# Patient Record
Sex: Male | Born: 1945
Health system: Southern US, Community
[De-identification: ages and names within clinical notes are randomized; demographics above are authoritative.]

## PROBLEM LIST (undated history)

## (undated) DIAGNOSIS — R7303 Prediabetes: Secondary | ICD-10-CM

## (undated) DIAGNOSIS — M1990 Unspecified osteoarthritis, unspecified site: Secondary | ICD-10-CM

## (undated) DIAGNOSIS — I2510 Atherosclerotic heart disease of native coronary artery without angina pectoris: Secondary | ICD-10-CM

## (undated) DIAGNOSIS — Z720 Tobacco use: Secondary | ICD-10-CM

## (undated) DIAGNOSIS — M545 Low back pain, unspecified: Secondary | ICD-10-CM

## (undated) DIAGNOSIS — D696 Thrombocytopenia, unspecified: Secondary | ICD-10-CM

## (undated) DIAGNOSIS — R001 Bradycardia, unspecified: Secondary | ICD-10-CM

## (undated) DIAGNOSIS — I714 Abdominal aortic aneurysm, without rupture, unspecified: Secondary | ICD-10-CM

## (undated) DIAGNOSIS — I779 Disorder of arteries and arterioles, unspecified: Secondary | ICD-10-CM

## (undated) DIAGNOSIS — I639 Cerebral infarction, unspecified: Secondary | ICD-10-CM

## (undated) DIAGNOSIS — Z9289 Personal history of other medical treatment: Secondary | ICD-10-CM

## (undated) DIAGNOSIS — N183 Chronic kidney disease, stage 3 unspecified: Secondary | ICD-10-CM

## (undated) DIAGNOSIS — I739 Peripheral vascular disease, unspecified: Secondary | ICD-10-CM

## (undated) DIAGNOSIS — D649 Anemia, unspecified: Secondary | ICD-10-CM

## (undated) DIAGNOSIS — H5440 Blindness, one eye, unspecified eye: Secondary | ICD-10-CM

## (undated) DIAGNOSIS — I480 Paroxysmal atrial fibrillation: Secondary | ICD-10-CM

## (undated) DIAGNOSIS — E785 Hyperlipidemia, unspecified: Secondary | ICD-10-CM

## (undated) DIAGNOSIS — G8929 Other chronic pain: Secondary | ICD-10-CM

## (undated) DIAGNOSIS — I10 Essential (primary) hypertension: Secondary | ICD-10-CM

## (undated) DIAGNOSIS — K219 Gastro-esophageal reflux disease without esophagitis: Secondary | ICD-10-CM

## (undated) DIAGNOSIS — L219 Seborrheic dermatitis, unspecified: Secondary | ICD-10-CM

## (undated) HISTORY — DX: Abdominal aortic aneurysm, without rupture: I71.4

## (undated) HISTORY — DX: Chronic kidney disease, stage 3 (moderate): N18.3

## (undated) HISTORY — DX: Atherosclerotic heart disease of native coronary artery without angina pectoris: I25.10

## (undated) HISTORY — DX: Seborrheic dermatitis, unspecified: L21.9

## (undated) HISTORY — DX: Essential (primary) hypertension: I10

## (undated) HISTORY — DX: Peripheral vascular disease, unspecified: I73.9

## (undated) HISTORY — DX: Bradycardia, unspecified: R00.1

## (undated) HISTORY — DX: Anemia, unspecified: D64.9

## (undated) HISTORY — DX: Gastro-esophageal reflux disease without esophagitis: K21.9

## (undated) HISTORY — DX: Hyperlipidemia, unspecified: E78.5

## (undated) HISTORY — PX: CATARACT EXTRACTION W/ INTRAOCULAR LENS  IMPLANT, BILATERAL: SHX1307

## (undated) HISTORY — DX: Chronic kidney disease, stage 3 unspecified: N18.30

## (undated) HISTORY — DX: Disorder of arteries and arterioles, unspecified: I77.9

## (undated) HISTORY — DX: Paroxysmal atrial fibrillation: I48.0

## (undated) HISTORY — DX: Abdominal aortic aneurysm, without rupture, unspecified: I71.40

## (undated) HISTORY — DX: Thrombocytopenia, unspecified: D69.6

## (undated) HISTORY — DX: Tobacco use: Z72.0

---

## 1988-04-03 HISTORY — PX: SHOULDER OPEN ROTATOR CUFF REPAIR: SHX2407

## 2002-04-03 DIAGNOSIS — Z9289 Personal history of other medical treatment: Secondary | ICD-10-CM

## 2002-04-03 HISTORY — DX: Personal history of other medical treatment: Z92.89

## 2002-04-03 HISTORY — PX: CORONARY ARTERY BYPASS GRAFT: SHX141

## 2002-12-02 ENCOUNTER — Encounter: Admission: RE | Admit: 2002-12-02 | Discharge: 2002-12-02 | Payer: Self-pay | Admitting: Internal Medicine

## 2003-01-08 ENCOUNTER — Encounter: Admission: RE | Admit: 2003-01-08 | Discharge: 2003-01-08 | Payer: Self-pay | Admitting: Internal Medicine

## 2003-01-26 ENCOUNTER — Encounter: Admission: RE | Admit: 2003-01-26 | Discharge: 2003-01-26 | Payer: Self-pay | Admitting: Internal Medicine

## 2003-08-28 ENCOUNTER — Inpatient Hospital Stay (HOSPITAL_COMMUNITY): Admission: EM | Admit: 2003-08-28 | Discharge: 2003-08-31 | Payer: Self-pay | Admitting: Emergency Medicine

## 2003-09-09 ENCOUNTER — Encounter: Admission: RE | Admit: 2003-09-09 | Discharge: 2003-09-09 | Payer: Self-pay | Admitting: Internal Medicine

## 2003-12-24 ENCOUNTER — Emergency Department (HOSPITAL_COMMUNITY): Admission: EM | Admit: 2003-12-24 | Discharge: 2003-12-24 | Payer: Self-pay | Admitting: Family Medicine

## 2003-12-26 ENCOUNTER — Emergency Department (HOSPITAL_COMMUNITY): Admission: EM | Admit: 2003-12-26 | Discharge: 2003-12-26 | Payer: Self-pay | Admitting: Emergency Medicine

## 2004-02-17 ENCOUNTER — Emergency Department (HOSPITAL_COMMUNITY): Admission: EM | Admit: 2004-02-17 | Discharge: 2004-02-17 | Payer: Self-pay | Admitting: Family Medicine

## 2004-06-02 ENCOUNTER — Emergency Department (HOSPITAL_COMMUNITY): Admission: EM | Admit: 2004-06-02 | Discharge: 2004-06-02 | Payer: Self-pay | Admitting: Family Medicine

## 2004-06-04 ENCOUNTER — Emergency Department (HOSPITAL_COMMUNITY): Admission: EM | Admit: 2004-06-04 | Discharge: 2004-06-04 | Payer: Self-pay | Admitting: Family Medicine

## 2004-12-30 ENCOUNTER — Emergency Department (HOSPITAL_COMMUNITY): Admission: EM | Admit: 2004-12-30 | Discharge: 2004-12-31 | Payer: Self-pay | Admitting: Emergency Medicine

## 2005-02-18 ENCOUNTER — Inpatient Hospital Stay (HOSPITAL_COMMUNITY): Admission: EM | Admit: 2005-02-18 | Discharge: 2005-02-21 | Payer: Self-pay | Admitting: Emergency Medicine

## 2005-02-18 ENCOUNTER — Ambulatory Visit: Payer: Self-pay | Admitting: Internal Medicine

## 2005-02-20 ENCOUNTER — Encounter (INDEPENDENT_AMBULATORY_CARE_PROVIDER_SITE_OTHER): Payer: Self-pay | Admitting: Interventional Cardiology

## 2005-05-01 ENCOUNTER — Ambulatory Visit: Payer: Self-pay | Admitting: Internal Medicine

## 2005-06-29 ENCOUNTER — Ambulatory Visit: Payer: Self-pay | Admitting: Internal Medicine

## 2006-02-13 ENCOUNTER — Emergency Department (HOSPITAL_COMMUNITY): Admission: EM | Admit: 2006-02-13 | Discharge: 2006-02-13 | Payer: Self-pay | Admitting: Emergency Medicine

## 2006-02-20 DIAGNOSIS — K219 Gastro-esophageal reflux disease without esophagitis: Secondary | ICD-10-CM | POA: Insufficient documentation

## 2006-02-20 DIAGNOSIS — N183 Chronic kidney disease, stage 3 unspecified: Secondary | ICD-10-CM | POA: Insufficient documentation

## 2006-02-20 DIAGNOSIS — M549 Dorsalgia, unspecified: Secondary | ICD-10-CM | POA: Insufficient documentation

## 2006-03-01 ENCOUNTER — Ambulatory Visit: Payer: Self-pay | Admitting: Internal Medicine

## 2006-03-01 ENCOUNTER — Encounter (INDEPENDENT_AMBULATORY_CARE_PROVIDER_SITE_OTHER): Payer: Self-pay | Admitting: Internal Medicine

## 2006-03-01 LAB — CONVERTED CEMR LAB
ALT: 63 units/L — ABNORMAL HIGH (ref 0–53)
AST: 76 units/L — ABNORMAL HIGH (ref 0–37)
Albumin: 4.2 g/dL (ref 3.5–5.2)
Alkaline Phosphatase: 78 units/L (ref 39–117)
BUN: 16 mg/dL (ref 6–23)
Basophils Absolute: 0 10*3/uL (ref 0.0–0.1)
Basophils Relative: 0 % (ref 0–1)
CO2: 27 meq/L (ref 19–32)
Calcium: 9.8 mg/dL (ref 8.4–10.5)
Chloride: 106 meq/L (ref 96–112)
Creatinine, Ser: 1.4 mg/dL (ref 0.40–1.50)
Eosinophils Relative: 2 % (ref 0–4)
Glucose, Bld: 99 mg/dL (ref 70–99)
HCT: 47.2 % (ref 41.0–49.0)
Hemoglobin: 16.2 g/dL (ref 13.9–16.8)
Lymphocytes Relative: 26 % (ref 15–43)
Lymphs Abs: 1.7 10*3/uL (ref 0.8–3.1)
MCHC: 34.4 g/dL (ref 33.1–35.4)
MCV: 94 fL (ref 78.8–100.0)
Monocytes Absolute: 0.7 10*3/uL (ref 0.2–0.7)
Monocytes Relative: 11 % (ref 3–11)
Neutro Abs: 3.9 10*3/uL (ref 1.8–6.8)
Neutrophils Relative %: 61 % (ref 47–77)
Platelets: 161 10*3/uL (ref 152–374)
Potassium: 4.7 meq/L (ref 3.5–5.3)
RBC: 5.02 M/uL (ref 4.20–5.50)
RDW: 14 % (ref 11.5–15.3)
Sodium: 141 meq/L (ref 135–145)
Total Bilirubin: 0.9 mg/dL (ref 0.3–1.2)
Total Protein: 7.6 g/dL (ref 6.0–8.3)
WBC: 6.4 10*3/uL (ref 3.7–10.0)

## 2006-03-02 ENCOUNTER — Emergency Department (HOSPITAL_COMMUNITY): Admission: EM | Admit: 2006-03-02 | Discharge: 2006-03-02 | Payer: Self-pay | Admitting: Emergency Medicine

## 2006-03-09 ENCOUNTER — Encounter (INDEPENDENT_AMBULATORY_CARE_PROVIDER_SITE_OTHER): Payer: Self-pay | Admitting: *Deleted

## 2006-03-09 ENCOUNTER — Ambulatory Visit: Payer: Self-pay | Admitting: Internal Medicine

## 2006-03-09 LAB — CONVERTED CEMR LAB
BUN: 15 mg/dL (ref 6–23)
CO2: 28 meq/L (ref 19–32)
Calcium: 9.3 mg/dL (ref 8.4–10.5)
Chloride: 104 meq/L (ref 96–112)
Creatinine, Ser: 1.2 mg/dL (ref 0.40–1.50)
Glucose, Bld: 114 mg/dL — ABNORMAL HIGH (ref 70–99)
Potassium: 4.3 meq/L (ref 3.5–5.3)
Sodium: 138 meq/L (ref 135–145)

## 2006-03-13 ENCOUNTER — Ambulatory Visit: Payer: Self-pay | Admitting: Internal Medicine

## 2006-03-13 ENCOUNTER — Encounter: Payer: Self-pay | Admitting: Internal Medicine

## 2006-03-13 LAB — CONVERTED CEMR LAB
ALT: 54 units/L — ABNORMAL HIGH (ref 0–53)
AST: 58 units/L — ABNORMAL HIGH (ref 0–37)
Albumin: 4.3 g/dL (ref 3.5–5.2)
Alkaline Phosphatase: 81 units/L (ref 39–117)
Bilirubin, Direct: 0.1 mg/dL (ref 0.0–0.3)
Cholesterol: 157 mg/dL (ref 0–200)
HCV Ab: NEGATIVE
HDL: 36 mg/dL — ABNORMAL LOW (ref >39)
Hep A Total Ab: NEGATIVE
Hep B Core Total Ab: NEGATIVE
Hep B S Ab: NEGATIVE
Hepatitis B Surface Ag: NEGATIVE
Indirect Bilirubin: 0.7 mg/dL (ref 0.0–0.9)
LDL Cholesterol: 85 mg/dL (ref 0–99)
Total Bilirubin: 0.8 mg/dL (ref 0.3–1.2)
Total CHOL/HDL Ratio: 4.4
Total Protein: 8 g/dL (ref 6.0–8.3)
Triglycerides: 178 mg/dL — ABNORMAL HIGH (ref <150)
VLDL: 36 mg/dL (ref 0–40)

## 2006-03-23 ENCOUNTER — Ambulatory Visit: Payer: Self-pay | Admitting: Internal Medicine

## 2006-04-09 ENCOUNTER — Ambulatory Visit (HOSPITAL_COMMUNITY): Admission: RE | Admit: 2006-04-09 | Discharge: 2006-04-09 | Payer: Self-pay | Admitting: Hospitalist

## 2006-04-09 ENCOUNTER — Ambulatory Visit: Payer: Self-pay | Admitting: Hospitalist

## 2006-05-14 ENCOUNTER — Encounter (INDEPENDENT_AMBULATORY_CARE_PROVIDER_SITE_OTHER): Payer: Self-pay | Admitting: *Deleted

## 2006-05-14 ENCOUNTER — Ambulatory Visit: Payer: Self-pay | Admitting: Hospitalist

## 2006-05-14 LAB — CONVERTED CEMR LAB
Inflenza A Ag: NEGATIVE
Influenza B Ag: NEGATIVE

## 2006-05-22 ENCOUNTER — Ambulatory Visit (HOSPITAL_COMMUNITY): Admission: RE | Admit: 2006-05-22 | Discharge: 2006-05-22 | Payer: Self-pay | Admitting: Hospitalist

## 2006-05-22 ENCOUNTER — Encounter (INDEPENDENT_AMBULATORY_CARE_PROVIDER_SITE_OTHER): Payer: Self-pay | Admitting: Hospitalist

## 2006-06-11 ENCOUNTER — Ambulatory Visit: Payer: Self-pay | Admitting: Internal Medicine

## 2006-06-11 ENCOUNTER — Encounter (INDEPENDENT_AMBULATORY_CARE_PROVIDER_SITE_OTHER): Payer: Self-pay | Admitting: Unknown Physician Specialty

## 2006-06-11 ENCOUNTER — Ambulatory Visit: Payer: Self-pay | Admitting: Cardiology

## 2006-07-02 ENCOUNTER — Telehealth: Payer: Self-pay | Admitting: *Deleted

## 2006-07-03 ENCOUNTER — Ambulatory Visit: Payer: Self-pay | Admitting: Internal Medicine

## 2006-07-10 ENCOUNTER — Ambulatory Visit: Payer: Self-pay | Admitting: Internal Medicine

## 2006-07-10 ENCOUNTER — Encounter (INDEPENDENT_AMBULATORY_CARE_PROVIDER_SITE_OTHER): Payer: Self-pay | Admitting: Internal Medicine

## 2006-07-10 LAB — CONVERTED CEMR LAB
BUN: 17 mg/dL (ref 6–23)
CO2: 25 meq/L (ref 19–32)
Calcium: 9.7 mg/dL (ref 8.4–10.5)
Chloride: 103 meq/L (ref 96–112)
Creatinine, Ser: 1.31 mg/dL (ref 0.40–1.50)
Glucose, Bld: 91 mg/dL (ref 70–99)
Potassium: 4.6 meq/L (ref 3.5–5.3)
Sodium: 141 meq/L (ref 135–145)

## 2006-08-10 ENCOUNTER — Ambulatory Visit: Payer: Self-pay | Admitting: Cardiology

## 2006-08-17 ENCOUNTER — Ambulatory Visit: Payer: Self-pay | Admitting: Cardiology

## 2006-08-17 LAB — CONVERTED CEMR LAB
Albumin: 3.8 g/dL (ref 3.5–5.2)
Direct LDL: 73.7 mg/dL
HDL: 36.3 mg/dL — ABNORMAL LOW (ref >39.0)
Triglycerides: 309 mg/dL (ref 0–149)
VLDL: 62 mg/dL — ABNORMAL HIGH (ref 0–40)

## 2006-08-21 ENCOUNTER — Encounter (INDEPENDENT_AMBULATORY_CARE_PROVIDER_SITE_OTHER): Payer: Self-pay | Admitting: Internal Medicine

## 2006-08-21 ENCOUNTER — Ambulatory Visit: Payer: Self-pay | Admitting: Internal Medicine

## 2006-08-21 LAB — CONVERTED CEMR LAB: Vitamin B-12: 375 pg/mL (ref 211–911)

## 2006-08-29 ENCOUNTER — Ambulatory Visit: Payer: Self-pay | Admitting: Internal Medicine

## 2006-08-29 ENCOUNTER — Encounter (INDEPENDENT_AMBULATORY_CARE_PROVIDER_SITE_OTHER): Payer: Self-pay | Admitting: Internal Medicine

## 2006-08-29 LAB — CONVERTED CEMR LAB
BUN: 20 mg/dL (ref 6–23)
Potassium: 4.2 meq/L (ref 3.5–5.3)
Sodium: 140 meq/L (ref 135–145)

## 2007-02-04 ENCOUNTER — Ambulatory Visit: Payer: Self-pay | Admitting: Hospitalist

## 2007-02-04 ENCOUNTER — Encounter (INDEPENDENT_AMBULATORY_CARE_PROVIDER_SITE_OTHER): Payer: Self-pay | Admitting: *Deleted

## 2007-02-04 DIAGNOSIS — G4730 Sleep apnea, unspecified: Secondary | ICD-10-CM | POA: Insufficient documentation

## 2007-02-04 DIAGNOSIS — F329 Major depressive disorder, single episode, unspecified: Secondary | ICD-10-CM

## 2007-02-04 DIAGNOSIS — F32A Depression, unspecified: Secondary | ICD-10-CM | POA: Insufficient documentation

## 2007-02-05 LAB — CONVERTED CEMR LAB
HCT: 46.1 % (ref 39.0–52.0)
MCV: 93.1 fL (ref 78.0–100.0)
RBC: 4.95 M/uL (ref 4.22–5.81)
TSH: 1.262 microintl units/mL (ref 0.350–5.50)
WBC: 6 10*3/uL (ref 4.0–10.5)

## 2007-02-08 ENCOUNTER — Telehealth: Payer: Self-pay | Admitting: *Deleted

## 2007-02-12 ENCOUNTER — Ambulatory Visit (HOSPITAL_BASED_OUTPATIENT_CLINIC_OR_DEPARTMENT_OTHER): Admission: RE | Admit: 2007-02-12 | Discharge: 2007-02-12 | Payer: Self-pay | Admitting: *Deleted

## 2007-02-12 ENCOUNTER — Encounter (INDEPENDENT_AMBULATORY_CARE_PROVIDER_SITE_OTHER): Payer: Self-pay | Admitting: Internal Medicine

## 2007-02-17 ENCOUNTER — Ambulatory Visit: Payer: Self-pay | Admitting: Internal Medicine

## 2007-02-21 ENCOUNTER — Ambulatory Visit: Payer: Self-pay | Admitting: Internal Medicine

## 2007-02-21 ENCOUNTER — Encounter (INDEPENDENT_AMBULATORY_CARE_PROVIDER_SITE_OTHER): Payer: Self-pay | Admitting: Internal Medicine

## 2007-02-21 LAB — CONVERTED CEMR LAB
AST: 37 units/L (ref 0–37)
BUN: 24 mg/dL — ABNORMAL HIGH (ref 6–23)
Calcium: 9.7 mg/dL (ref 8.4–10.5)
Chloride: 103 meq/L (ref 96–112)
Cholesterol: 159 mg/dL (ref 0–200)
Creatinine, Ser: 1.37 mg/dL (ref 0.40–1.50)
Glucose, Bld: 91 mg/dL (ref 70–99)
HCT: 48.1 % (ref 39.0–52.0)
HDL: 35 mg/dL — ABNORMAL LOW (ref >39)
Hemoglobin: 16 g/dL (ref 13.0–17.0)
RDW: 13.8 % (ref 11.5–15.5)
Total CHOL/HDL Ratio: 4.5
Total CK: 316 units/L — ABNORMAL HIGH (ref 7–232)
Triglycerides: 265 mg/dL — ABNORMAL HIGH (ref <150)

## 2007-03-11 ENCOUNTER — Telehealth: Payer: Self-pay | Admitting: *Deleted

## 2007-03-11 ENCOUNTER — Ambulatory Visit: Payer: Self-pay | Admitting: Internal Medicine

## 2007-05-09 ENCOUNTER — Encounter (INDEPENDENT_AMBULATORY_CARE_PROVIDER_SITE_OTHER): Payer: Self-pay | Admitting: Internal Medicine

## 2007-05-09 ENCOUNTER — Ambulatory Visit: Payer: Self-pay | Admitting: Hospitalist

## 2007-07-04 ENCOUNTER — Emergency Department (HOSPITAL_COMMUNITY): Admission: EM | Admit: 2007-07-04 | Discharge: 2007-07-05 | Payer: Self-pay | Admitting: Emergency Medicine

## 2007-07-23 ENCOUNTER — Emergency Department (HOSPITAL_COMMUNITY): Admission: EM | Admit: 2007-07-23 | Discharge: 2007-07-24 | Payer: Self-pay | Admitting: Emergency Medicine

## 2007-09-22 ENCOUNTER — Emergency Department (HOSPITAL_COMMUNITY): Admission: EM | Admit: 2007-09-22 | Discharge: 2007-09-22 | Payer: Self-pay | Admitting: Emergency Medicine

## 2008-03-06 ENCOUNTER — Emergency Department (HOSPITAL_COMMUNITY): Admission: EM | Admit: 2008-03-06 | Discharge: 2008-03-06 | Payer: Self-pay | Admitting: Emergency Medicine

## 2008-04-03 HISTORY — PX: ABDOMINAL AORTIC ANEURYSM REPAIR: SUR1152

## 2008-06-05 ENCOUNTER — Encounter (INDEPENDENT_AMBULATORY_CARE_PROVIDER_SITE_OTHER): Payer: Self-pay | Admitting: *Deleted

## 2008-06-05 ENCOUNTER — Ambulatory Visit: Payer: Self-pay | Admitting: Internal Medicine

## 2008-06-05 LAB — CONVERTED CEMR LAB
Albumin: 4.2 g/dL (ref 3.5–5.2)
BUN: 21 mg/dL (ref 6–23)
CO2: 24 meq/L (ref 19–32)
Calcium: 9.7 mg/dL (ref 8.4–10.5)
Chloride: 104 meq/L (ref 96–112)
Cholesterol: 161 mg/dL (ref 0–200)
Creatinine, Ser: 1.17 mg/dL (ref 0.40–1.50)
Glucose, Bld: 104 mg/dL — ABNORMAL HIGH (ref 70–99)
HDL: 36 mg/dL — ABNORMAL LOW (ref >39)
Total CHOL/HDL Ratio: 4.5
Triglycerides: 186 mg/dL — ABNORMAL HIGH (ref <150)

## 2008-07-10 DIAGNOSIS — L219 Seborrheic dermatitis, unspecified: Secondary | ICD-10-CM | POA: Insufficient documentation

## 2008-11-03 ENCOUNTER — Telehealth: Payer: Self-pay | Admitting: *Deleted

## 2008-11-04 ENCOUNTER — Emergency Department (HOSPITAL_COMMUNITY): Admission: EM | Admit: 2008-11-04 | Discharge: 2008-11-04 | Payer: Self-pay | Admitting: Emergency Medicine

## 2008-11-17 ENCOUNTER — Ambulatory Visit: Payer: Self-pay | Admitting: Licensed Clinical Social Worker

## 2008-12-03 ENCOUNTER — Ambulatory Visit: Payer: Self-pay | Admitting: Internal Medicine

## 2008-12-11 ENCOUNTER — Encounter: Payer: Self-pay | Admitting: Internal Medicine

## 2008-12-11 ENCOUNTER — Ambulatory Visit (HOSPITAL_COMMUNITY): Admission: RE | Admit: 2008-12-11 | Discharge: 2008-12-11 | Payer: Self-pay | Admitting: Internal Medicine

## 2008-12-11 ENCOUNTER — Ambulatory Visit: Payer: Self-pay | Admitting: Surgery

## 2008-12-16 ENCOUNTER — Encounter: Payer: Self-pay | Admitting: Internal Medicine

## 2008-12-21 ENCOUNTER — Ambulatory Visit: Payer: Self-pay | Admitting: Vascular Surgery

## 2009-01-05 ENCOUNTER — Telehealth: Payer: Self-pay | Admitting: Internal Medicine

## 2009-01-19 ENCOUNTER — Encounter: Admission: RE | Admit: 2009-01-19 | Discharge: 2009-01-19 | Payer: Self-pay | Admitting: Vascular Surgery

## 2009-01-19 ENCOUNTER — Ambulatory Visit: Payer: Self-pay | Admitting: Vascular Surgery

## 2009-01-22 ENCOUNTER — Encounter: Payer: Self-pay | Admitting: Internal Medicine

## 2009-01-22 ENCOUNTER — Ambulatory Visit: Payer: Self-pay | Admitting: Internal Medicine

## 2009-01-22 LAB — CONVERTED CEMR LAB
ALT: 25 units/L (ref 0–53)
BUN: 20 mg/dL (ref 6–23)
Blood in Urine, dipstick: NEGATIVE
CO2: 28 meq/L (ref 19–32)
Calcium: 9 mg/dL (ref 8.4–10.5)
Creatinine, Ser: 1.33 mg/dL (ref 0.40–1.50)
HCT: 41.8 % (ref 39.0–52.0)
Hemoglobin: 14.8 g/dL (ref 13.0–17.0)
MCV: 92.3 fL (ref >=78.0)
Platelets: 167 10*3/uL (ref 150–400)
Protein, U semiquant: NEGATIVE
RDW: 12.8 % (ref 11.5–15.5)
Total Bilirubin: 0.7 mg/dL (ref 0.3–1.2)
Urobilinogen, UA: 0.2
WBC Urine, dipstick: NEGATIVE

## 2009-01-25 ENCOUNTER — Ambulatory Visit: Payer: Self-pay | Admitting: Internal Medicine

## 2009-01-25 ENCOUNTER — Ambulatory Visit (HOSPITAL_COMMUNITY): Admission: RE | Admit: 2009-01-25 | Discharge: 2009-01-25 | Payer: Self-pay | Admitting: Internal Medicine

## 2009-01-25 ENCOUNTER — Encounter: Payer: Self-pay | Admitting: Internal Medicine

## 2009-01-25 LAB — CONVERTED CEMR LAB

## 2009-01-30 ENCOUNTER — Encounter: Payer: Self-pay | Admitting: Cardiology

## 2009-02-03 ENCOUNTER — Encounter (INDEPENDENT_AMBULATORY_CARE_PROVIDER_SITE_OTHER): Payer: Self-pay | Admitting: *Deleted

## 2009-02-03 ENCOUNTER — Ambulatory Visit: Payer: Self-pay | Admitting: Vascular Surgery

## 2009-02-03 ENCOUNTER — Inpatient Hospital Stay (HOSPITAL_COMMUNITY): Admission: RE | Admit: 2009-02-03 | Discharge: 2009-02-04 | Payer: Self-pay | Admitting: Vascular Surgery

## 2009-02-03 ENCOUNTER — Encounter: Payer: Self-pay | Admitting: Cardiology

## 2009-02-18 ENCOUNTER — Emergency Department (HOSPITAL_COMMUNITY): Admission: EM | Admit: 2009-02-18 | Discharge: 2009-02-18 | Payer: Self-pay | Admitting: Emergency Medicine

## 2009-02-22 ENCOUNTER — Ambulatory Visit: Payer: Self-pay | Admitting: Cardiology

## 2009-02-22 DIAGNOSIS — I480 Paroxysmal atrial fibrillation: Secondary | ICD-10-CM

## 2009-02-22 HISTORY — DX: Paroxysmal atrial fibrillation: I48.0

## 2009-02-25 ENCOUNTER — Inpatient Hospital Stay (HOSPITAL_COMMUNITY): Admission: EM | Admit: 2009-02-25 | Discharge: 2009-03-03 | Payer: Self-pay | Admitting: Emergency Medicine

## 2009-03-01 ENCOUNTER — Ambulatory Visit: Payer: Self-pay | Admitting: Infectious Diseases

## 2009-03-03 ENCOUNTER — Ambulatory Visit: Payer: Self-pay | Admitting: Infectious Diseases

## 2009-03-08 ENCOUNTER — Telehealth: Payer: Self-pay | Admitting: Cardiology

## 2009-03-08 ENCOUNTER — Encounter: Payer: Self-pay | Admitting: Internal Medicine

## 2009-03-10 ENCOUNTER — Ambulatory Visit: Payer: Self-pay | Admitting: Cardiology

## 2009-03-11 ENCOUNTER — Telehealth: Payer: Self-pay | Admitting: Infectious Diseases

## 2009-03-15 ENCOUNTER — Encounter: Payer: Self-pay | Admitting: Infectious Diseases

## 2009-03-16 ENCOUNTER — Ambulatory Visit: Payer: Self-pay | Admitting: Vascular Surgery

## 2009-03-17 ENCOUNTER — Telehealth: Payer: Self-pay | Admitting: Infectious Diseases

## 2009-03-29 ENCOUNTER — Encounter: Payer: Self-pay | Admitting: Internal Medicine

## 2009-04-05 ENCOUNTER — Encounter: Payer: Self-pay | Admitting: Infectious Diseases

## 2009-04-06 ENCOUNTER — Ambulatory Visit: Payer: Self-pay | Admitting: Vascular Surgery

## 2009-04-07 ENCOUNTER — Telehealth: Payer: Self-pay | Admitting: Infectious Diseases

## 2009-04-07 ENCOUNTER — Encounter: Payer: Self-pay | Admitting: Infectious Diseases

## 2009-06-08 ENCOUNTER — Encounter (INDEPENDENT_AMBULATORY_CARE_PROVIDER_SITE_OTHER): Payer: Self-pay | Admitting: *Deleted

## 2009-12-16 ENCOUNTER — Telehealth: Payer: Self-pay | Admitting: Internal Medicine

## 2010-02-07 ENCOUNTER — Ambulatory Visit: Payer: Self-pay | Admitting: Internal Medicine

## 2010-02-18 ENCOUNTER — Telehealth: Payer: Self-pay | Admitting: Internal Medicine

## 2010-04-24 ENCOUNTER — Encounter: Payer: Self-pay | Admitting: Vascular Surgery

## 2010-05-03 NOTE — Progress Notes (Signed)
Summary: Refill/gh  Phone Note Refill Request Message from:  Patient on February 18, 2010 3:26 PM  Pt called and said that he was told that when his Cellulitis flares up that he counld get a prescription for an antibiotic.  Has a little reddness in his leg.  Hurts inside where he had his open heart surgery.  Pt was seen by doctor 02/07/2010.   Method Requested: Fax to Local Pharmacy Initial call taken by: Angelina Ok RN,  February 18, 2010 3:29 PM  Follow-up for Phone Call        I discussed this pt with Dr Odis Luster at his last visit. He had no PE exam findings at that time to support use of ABX and we did tell pt that he could call in and we would either see him or call in ABX. It still doesn't seem as if this is cellulitis but since Fri and he is expecting ABX  and we can't see him until next I will call in ABX. Keflex won't provide MRSA coverage so will call in Bactrium.   Follow-up by: Blanch Media MD,  February 18, 2010 5:36 PM    New/Updated Medications: SULFAMETHOXAZOLE-TRIMETHOPRIM 400-80 MG TABS (SULFAMETHOXAZOLE-TRIMETHOPRIM) One by mouth two times a day for 7 days for leg. Prescriptions: SULFAMETHOXAZOLE-TRIMETHOPRIM 400-80 MG TABS (SULFAMETHOXAZOLE-TRIMETHOPRIM) One by mouth two times a day for 7 days for leg.  #14 x 0   Entered and Authorized by:   Blanch Media MD   Signed by:   Blanch Media MD on 02/18/2010   Method used:   Electronically to        [facility] [phone]* (retail)       [address]       [place], [place]  [address]       Ph: [phone]       Fax: [phone]   RxID:   [id number]   Appended Document: Refill/gh Call to pt to see if he has picked up his antibiotic.  Pt said that he did and that his leg is looking better.  Angelina Ok, RN February 21, 2010.  11:40 AM

## 2010-05-03 NOTE — Progress Notes (Signed)
Summary: Missed apptment and needs to stop abx and pull picc  Phone Note Outgoing Call   Summary of Call: Pt missed his follow up yesterday. Reviewed his labs and they are stable. He has completed his IV abx.   WIll give order to d/c abx and pull picc.   [facility] and call pt to let him know.   I can work him in this week or next week if needed Thanks Theodoro Grist Initial call taken by: Clydie Braun MD,  April 07, 2009 12:29 PM  Follow-up for Phone Call        [facility] called today to see if pt was here for visit. The documented info. by Dr Sampson Goon given. Tomasita Morrow RN  April 07, 2009 4:30 PM

## 2010-05-03 NOTE — Letter (Signed)
Summary: Appointment - Reschedule  [facility], Main Office  [address]   [place], [place] [address]   Phone: [phone]  Fax: [phone]     June 08, 2009 MRN: [id number]   Dylan Morgan [address] [place], [place]  [address]   Dear Dylan Morgan,   Due to a change in our office schedule, your appointment on 06/16/2009 at  8:30am with Dr Myrtis Ser must be changed.  It is very important that we reach you to reschedule this appointment. We look forward to participating in your health care needs. Please contact us at the number listed above at your earliest convenience to reschedule this appointment.     Sincerely,  Migdalia Dk [facility] Scheduling Team

## 2010-05-03 NOTE — Assessment & Plan Note (Signed)
Summary: ACUTE-F/U WITH CELLULITIS/CFB(VEGA)   Vital Signs:  Patient profile:   65 year old male Height:      69 inches Weight:      270.7 pounds BMI:     40.12 Temp:     97.4 degrees F oral Pulse rate:   61 / minute BP sitting:   150 / 101  (right arm)  Vitals Entered By: Filomena Jungling NT II (February 07, 2010 2:41 PM) CC: KNEE PAIN AND BACK, Depression Is Patient Diabetic? No Pain Assessment Patient in pain? yes     Location: KNEES, AND BACK Intensity: 10 Type: aching Onset of pain  Chronic Nutritional Status BMI of > 30 = obese  Have you ever been in a relationship where you felt threatened, hurt or afraid?No   Does patient need assistance? Functional Status Self care Ambulation Normal   Primary Care Provider:  Laren Everts MD  CC:  KNEE PAIN AND BACK and Depression.  History of Present Illness: This is a 65 year old with a hx of CAD sp CABG,  aortofemoral bypass, and HTN who presents with concerns that he is developing cellulitis.  Pt states that this is recurrent and occurs annually when the weather turns cold.  Pt experiences leg pain that is most pronounced at night.  Pt describes the pain as intermittant throbbing and of 8-9/10 in intensity.   Mr. Quintela has noticed that warmth tends to improve the pain and he get some relief with a heating blanket or by standing in front of a fire.  The patient states that this has been going on since he had the saphenous graft taken from his leg for is CABG.  Pt has tried compression stockings in the past but this has not helped.  Mr. Hissong states that in 2008, he was given a prescription of keflex which prevented him from developing cellulitis that year.  The pt does deny any redness or increased swelling of the right leg.   He also denies any nausea, vomiting, fever, chills, sob, or chest pain.    Depression History:      The patient denies a depressed mood most of the day and a diminished interest in his usual daily  activities.         Preventive Screening-Counseling & Management  Alcohol-Tobacco     Alcohol drinks/day: 0     Smoking Status: current     Smoking Cessation Counseling: yes     Packs/Day: 1 pack     Year Started: many years  Caffeine-Diet-Exercise     Does Patient Exercise: yes     Type of exercise: WALKING     Times/week: 1-2  Problems Prior to Update: 1)  Methicillin Susceptible Staph Inf Cce & Uns Site  (ICD-041.11) 2)  Atrial Fibrillation  (ICD-427.31) 3)  Bradycardia  (ICD-427.89) 4)  Hypotension  (ICD-458.9) 5)  Abdominal Aortic Aneurysm  (ICD-441.4) 6)  Unspecified Disorder of Skin&subcutaneous Tissue  (ICD-709.9) 7)  Coronary Artery Bypass Graft, Hx of  (ICD-V45.81) 8)  Dermatitis, Seborrheic  (ICD-690.10) 9)  Sore Throat  (ICD-462) 10)  Somnolence  (ICD-780.09) 11)  Health Maintenance Exam  (ICD-V70.0) 12)  Myalgia  (ICD-729.1) 13)  Other Malaise and Fatigue  (ICD-780.79) 14)  Sleep Apnea  (ICD-780.57) 15)  Depressive Disorder  (ICD-311) 16)  Tingling  (ICD-782.0) 17)  Keratitis, Superficial Nos  (ICD-370.20) 18)  Hypertension  (ICD-401.9) 19)  Neurodermatitis  (ICD-698.3) 20)  Wheezing  (ICD-786.07) 21)  Upper Respiratory Infection, Acute  (  ICD-465.9) 22)  Neck Pain, Chronic  (ICD-723.1) 23)  Back Pain, Chronic  (ICD-724.5) 24)  Tobacco Abuse  (ICD-305.1) 25)  Cellulitis, Leg, Right  (ICD-682.6) 26)  Renal Insufficiency  (ICD-588.9) 27)  Hyperlipidemia  (ICD-272.4) 28)  Gerd  (ICD-530.81) 29)  Coronary Artery Disease  (ICD-414.00)  Family History: Reviewed history from 07/10/2008 and no changes required. Mom- DM Father- Stroke 23  Sister - thyroid ca.  Brothers'  CAD  Social History: Reviewed history from 07/10/2008 and no changes required. Lives with son and his wife. Disabled  Divorced  Tobacco Use - Former.  Alcohol Use - no Regular Exercise - yes Drug Use - no  Review of Systems       Negative as per HPI.  Physical Exam  General:   alert, well-developed, and well-hydrated.   Eyes:  vision grossly intact, pupils equal, pupils round, and pupils reactive to light.   Nose:  no nasal discharge.   Mouth:  pharynx pink and moist.   Neck:  supple.   Lungs:  normal respiratory effort, normal breath sounds, no crackles, and no wheezes.   Heart:  normal rate, regular rhythm, no murmur, no gallop, and no rub.   Abdomen:  soft, non-tender, normal bowel sounds, no distention, and no masses.   Msk:  normal ROM, no joint tenderness, and no joint swelling.   Pulses:  2+ pedal pulses Extremities:  Trace edem in the LE bilaterally.  There is no erythema or induration of the skin.  There is no temperature diffrence between the legs.   The leg was mildly tender to palpation.  Neurologic:  cranial nerves II-XII intact.   Skin:  No rashes.    Impression & Recommendations:  Problem # 1:  LEG PAIN, RIGHT (ICD-729.5) Currently, the patient does not show signs of cellulitis.  As a result, I will not treat with antibiotics today as the pts symptoms could also be explained by venous stasis.  The patient was given strict instructions to contact the clinic immediately if he notes any changes in the skin including redness, warmth or worsening swelling.  If the pts symptoms get worse, I will prescribe a course of keflex for presumed superficial cellulitis.  In the interim, I instructed the pt to keep his leg elevated when he is able, keep the limb warm and to exercise excessively.    Problem # 2:  Preventive Health Care (ICD-V70.0) Thye patient was offered a flu shot but declined as he has already had one.   Complete Medication List: 1)  Aspirin 81 Mg Tbec (Aspirin) .... Take 1 tablet by mouth once a day 2)  Albuterol 90 Mcg/act Aers (Albuterol) .... 2 puffs every 4 hrs as needed for wheezing and shortness of breath 3)  Mens Multivitamin Plus Tabs (Multiple vitamins-minerals) .... Take 1 tablet by mouth once a day 4)  Zocor 10 Mg Tabs (Simvastatin)  .... Take 1 tablet by mouth once a day 5)  Vitamin E Mtc 1000 Unit Caps (Vitamin e) .... Take 1 tablet by mouth once a day 6)  Vistaril 25 Mg Caps (Hydroxyzine pamoate) .... Take 1 tablet by mouth every 8 hours as needed for itching 7)  Nystatin 100000 Unit/gm Crea (Nystatin) .... To groin area two times a day  Patient Instructions: 1)  I do not think you have cellulitis at this time.  Please keep your foot elevated during the day and keep your leg warm to improve your symptoms.  If you notice any changes in  the skin, please call back and we will give you a prescription for an antibiotic.   Orders Added: 1)  Est. Patient Level III [[address]]      Appended Document: ACUTE-F/U WITH CELLULITIS/CFB(VEGA) I discussed the patient with Dr. Cathey Endow and I agree with the assessment and plan as outlined above.

## 2010-05-03 NOTE — Miscellaneous (Signed)
Summary: id consult note 11/29  Clinical Lists Changes id c/c nov 29  REASON FOR CONSULTATION:  Infected right arteriovenous graft surgical   site.      HISTORY OF PRESENT ILLNESS:  This is a very pleasant 65 year old white   male, history of coronary artery disease, hypertension, hyperlipidemia,   gastroesophageal reflux disease, sleep apnea who underwent aortic stent   grafting on November 3 by Dr. Juleen China and Dr. Hart Rochester.   Apparently he did relatively well after this procedure and was   discharged home.  However, he developed increasing pain and nausea and   was seen in the emergency room for a fluid collection that was noted on   CT of his right groin.  He was admitted November 24 for evaluation of   this.  In the emergency room Dr. Hart Rochester aspirated 35 mL purulent   material from the abscess cavity.  Cultures of this revealed MRSA.  He   was then taken to the emergency room for definitive I and D on November   25 with findings of 150 mL of frank pus.  The wound tracked to the   scrotum; however, there was noted to be a layer of intact tissue over   the femoral vessels.  He was started initially on vancomycin and Zosyn   and has been on that since admission.  He had blood cultures done on the   25th which are also negative.  Anaerobic cultures negative from that   time.      PAST MEDICAL HISTORY:  Per HPI.      SOCIAL HISTORY:  The patient lives with his son.  He is single.  He   works as a Psychologist, counselling.  He continues to smoke cigarettes a pack a   day.      FAMILY HISTORY:  Noncontributory.      ALLERGIES:  NO KNOWN DRUG ALLERGIES.      MEDICATIONS:  Currently he is on antibiotics of vancomycin and Zosyn.   He is also on low-dose molecular weight heparin, albuterol and Benadryl.      PHYSICAL EXAMINATION:  GENERAL:  He is a pleasant white male in no acute   distress.  He is overweight.   VITAL SIGNS:  Temperature max is 98.8, pulse of 54, blood pressure   138/83, respirations 27, 97% on room air.  HEENT:  Pupils equal, round   and react to light and accommodation.  Extraocular movements are intact.   Sclerae anicteric.  Oropharynx clear.   NECK:  Supple.   HEART:  Regular.   LUNGS:  Clear.   ABDOMEN:  Soft, nontender, nondistended.  No hepatosplenomegaly.   EXTREMITIES:  In his right groin he has an incision that is closed but   has some packing.  There is some mild drainage around this.  It is   mildly tender to palpation.  His extremities are warm and well-perfused.   He has 1+ edema bilaterally.      IMAGING:  The patient had CT angio pelvis and abdomen done on November   18.  This showed satisfactory appearance of the aorta x iliac stent   graft.  There was some evidence of infarct versus pyelonephritis   involving the left kidney.  Infarcts are favored likely due to emboli at   the time of surgery. Per CT read there was also infarct involving the   lower pole of the spleen.      Microbiology:  The patient  had cultures done of the wound November 25   that grew MRSA sensitive to clindamycin, erythromycin, gentamicin,   rifampin, Bactrim, vancomycin and tetracycline.  It was resistant to   oxacillin and penicillin and intermediate to levofloxacin.  Anaerobic   cultures are negative.  Blood cultures on November 25 were negative x2.   CBC:  White count 5.4, hemoglobin 12.2, platelets 287.  BMET:  BUN and   creatinine of 16/1.52.  Of note, his creatinine on November 26 was 1.82   and on admission was 1.23.  Also on admission November 18 his white   count was 9.2.      IMPRESSION:   1. Infected abscess at the site of a right aortic bifemoral graft.       a.     Methicillin-resistant Staphylococcus aureus from culture.       b.     No evidence of involvement of the graft on operative report.   2. Evidence of emboli to his kidneys and spleen on CT of November 18       with infarction his kidneys and spleen.  This is presumably due to        emboli at the time of surgery.   3. No evidence of endocarditis with blood cultures negative.      RECOMMENDATIONS:   1. I would recommend at least 4 weeks of IV vancomycin for treatment.   2. Will discontinue Zosyn.   3. Put in a PICC line for evaluation and long-term IV antibiotics.   4. Will arrange home health for wound care as well as for IV       antibiotics.   5. I will follow the patient clinically in 4 weeks' time.  He may       benefit at that time from transition to oral therapy depending on       how the wound has healed.  This is quite a serious infection as it       could involve the graft and would require repeat surgery, possibly       removal of the graft so I would favor long-term oral therapy       following his IV antibiotics.   6. Thank you for the consult.  We will follow with you.  Please call       with questions.

## 2010-05-03 NOTE — Op Note (Signed)
Summary: [facility]  [facility]   Imported By: Marylou Mccoy 04/09/2009 10:41:29  _____________________________________________________________________  External Attachment:    Type:   Image     Comment:   External Document

## 2010-05-03 NOTE — Miscellaneous (Signed)
Summary: [facility]:   Imported By: Florinda Marker 04/19/2009 14:50:35  _____________________________________________________________________  External Attachment:    Type:   Image     Comment:   External Document

## 2010-05-03 NOTE — Progress Notes (Signed)
Summary: Refill/gh  Phone Note Refill Request Message from:  Fax from Pharmacy on December 16, 2009 2:24 PM  Refills Requested: Medication #1:  VISTARIL 25 MG CAPS Take 1 tablet by mouth every 8 hours as needed for itching   Last Refilled: 04/13/2009  Method Requested: Electronic Initial call taken by: Angelina Ok RN,  December 16, 2009 2:26 PM    Prescriptions: VISTARIL 25 MG CAPS (HYDROXYZINE PAMOATE) Take 1 tablet by mouth every 8 hours as needed for itching  #45 x 2   Entered and Authorized by:   Laren Everts MD   Signed by:   Laren Everts MD on 12/20/2009   Method used:   Electronically to        [facility] [phone]* (retail)       [address]       [place], [place]  [address]       Ph: [phone]       Fax: [phone]   RxID:   [id number]

## 2010-07-05 LAB — BASIC METABOLIC PANEL
CO2: 24 mEq/L (ref 19–32)
Calcium: 9.1 mg/dL (ref 8.4–10.5)
Chloride: 104 mEq/L (ref 96–112)
Creatinine, Ser: 1.36 mg/dL (ref 0.4–1.5)
GFR calc Af Amer: >60 mL/min (ref >60)
Sodium: 134 mEq/L — ABNORMAL LOW (ref 135–145)

## 2010-07-05 LAB — CBC
Hemoglobin: 11.4 g/dL — ABNORMAL LOW (ref 13.0–17.0)
MCHC: 34.6 g/dL (ref 30.0–36.0)
MCV: 90.5 fL (ref 78.0–100.0)
RBC: 3.64 MIL/uL — ABNORMAL LOW (ref 4.22–5.81)
WBC: 6.5 10*3/uL (ref 4.0–10.5)

## 2010-07-06 LAB — GLUCOSE, CAPILLARY
Glucose-Capillary: 102 mg/dL — ABNORMAL HIGH (ref 70–99)
Glucose-Capillary: 87 mg/dL (ref 70–99)
Glucose-Capillary: 87 mg/dL (ref 70–99)
Glucose-Capillary: 91 mg/dL (ref 70–99)

## 2010-07-06 LAB — CBC
Hemoglobin: 11.4 g/dL — ABNORMAL LOW (ref 13.0–17.0)
Hemoglobin: 12.2 g/dL — ABNORMAL LOW (ref 13.0–17.0)
Hemoglobin: 13.4 g/dL (ref 13.0–17.0)
MCHC: 34.3 g/dL (ref 30.0–36.0)
MCHC: 34.3 g/dL (ref 30.0–36.0)
MCHC: 34.3 g/dL (ref 30.0–36.0)
MCHC: 34.3 g/dL (ref 30.0–36.0)
MCHC: 34.8 g/dL (ref 30.0–36.0)
MCV: 90.3 fL (ref 78.0–100.0)
MCV: 90.5 fL (ref 78.0–100.0)
MCV: 91.2 fL (ref 78.0–100.0)
MCV: 91.2 fL (ref 78.0–100.0)
MCV: 91.8 fL (ref 78.0–100.0)
Platelets: 331 10*3/uL (ref 150–400)
Platelets: 150 10*3/uL (ref 150–400)
Platelets: 174 10*3/uL (ref 150–400)
RBC: 3.64 MIL/uL — ABNORMAL LOW (ref 4.22–5.81)
RBC: 3.95 MIL/uL — ABNORMAL LOW (ref 4.22–5.81)
RBC: 4.09 MIL/uL — ABNORMAL LOW (ref 4.22–5.81)
RBC: 4.27 MIL/uL (ref 4.22–5.81)
RBC: 4.54 MIL/uL (ref 4.22–5.81)
RDW: 12.9 % (ref 11.5–15.5)
RDW: 13.1 % (ref 11.5–15.5)
RDW: 13.1 % (ref 11.5–15.5)
RDW: 13.2 % (ref 11.5–15.5)
RDW: 13 % (ref 11.5–15.5)
WBC: 5.8 10*3/uL (ref 4.0–10.5)
WBC: 6.6 10*3/uL (ref 4.0–10.5)

## 2010-07-06 LAB — BASIC METABOLIC PANEL
BUN: 19 mg/dL (ref 6–23)
BUN: 24 mg/dL — ABNORMAL HIGH (ref 6–23)
BUN: 14 mg/dL (ref 6–23)
CO2: 24 mEq/L (ref 19–32)
CO2: 25 mEq/L (ref 19–32)
CO2: 26 mEq/L (ref 19–32)
CO2: 29 mEq/L (ref 19–32)
Calcium: 9 mg/dL (ref 8.4–10.5)
Calcium: 9.4 mg/dL (ref 8.4–10.5)
Calcium: 9.5 mg/dL (ref 8.4–10.5)
Calcium: 8.2 mg/dL — ABNORMAL LOW (ref 8.4–10.5)
Chloride: 101 mEq/L (ref 96–112)
Chloride: 106 mEq/L (ref 96–112)
Creatinine, Ser: 1.37 mg/dL (ref 0.4–1.5)
Creatinine, Ser: 1.52 mg/dL — ABNORMAL HIGH (ref 0.4–1.5)
GFR calc Af Amer: 45 mL/min — ABNORMAL LOW (ref >60)
GFR calc Af Amer: >60 mL/min (ref >60)
GFR calc Af Amer: >60 mL/min (ref >60)
GFR calc non Af Amer: 48 mL/min — ABNORMAL LOW (ref >60)
GFR calc non Af Amer: 54 mL/min — ABNORMAL LOW (ref >60)
Glucose, Bld: 101 mg/dL — ABNORMAL HIGH (ref 70–99)
Glucose, Bld: 120 mg/dL — ABNORMAL HIGH (ref 70–99)
Glucose, Bld: 93 mg/dL (ref 70–99)
Glucose, Bld: 149 mg/dL — ABNORMAL HIGH (ref 70–99)
Potassium: 5.1 mEq/L (ref 3.5–5.1)
Sodium: 134 mEq/L — ABNORMAL LOW (ref 135–145)
Sodium: 134 mEq/L — ABNORMAL LOW (ref 135–145)
Sodium: 141 mEq/L (ref 135–145)

## 2010-07-06 LAB — WOUND CULTURE

## 2010-07-06 LAB — CROSSMATCH: Antibody Screen: NEGATIVE

## 2010-07-06 LAB — COMPREHENSIVE METABOLIC PANEL
ALT: 29 U/L (ref 0–53)
AST: 37 U/L (ref 0–37)
Albumin: 4 g/dL (ref 3.5–5.2)
Alkaline Phosphatase: 83 U/L (ref 39–117)
CO2: 28 mEq/L (ref 19–32)
Chloride: 106 mEq/L (ref 96–112)
GFR calc Af Amer: >60 mL/min (ref >60)
GFR calc non Af Amer: 51 mL/min — ABNORMAL LOW (ref >60)
Potassium: 4.9 mEq/L (ref 3.5–5.1)
Sodium: 140 mEq/L (ref 135–145)
Total Bilirubin: 0.6 mg/dL (ref 0.3–1.2)

## 2010-07-06 LAB — POCT I-STAT, CHEM 8
Creatinine, Ser: 1.5 mg/dL (ref 0.4–1.5)
HCT: 37 % — ABNORMAL LOW (ref 39.0–52.0)
Hemoglobin: 12.6 g/dL — ABNORMAL LOW (ref 13.0–17.0)
Sodium: 137 mEq/L (ref 135–145)
TCO2: 27 mmol/L (ref 0–100)

## 2010-07-06 LAB — APTT: aPTT: 34 seconds (ref 24–37)

## 2010-07-06 LAB — DIFFERENTIAL
Basophils Absolute: 0 10*3/uL (ref 0.0–0.1)
Basophils Relative: 0 % (ref 0–1)
Eosinophils Absolute: 0.2 10*3/uL (ref 0.0–0.7)
Eosinophils Relative: 3 % (ref 0–5)
Monocytes Absolute: 0.4 10*3/uL (ref 0.1–1.0)
Monocytes Relative: 4 % (ref 3–12)

## 2010-07-06 LAB — ANAEROBIC CULTURE

## 2010-07-06 LAB — CULTURE, ROUTINE-ABSCESS

## 2010-07-06 LAB — BLOOD GAS, ARTERIAL
Acid-base deficit: 0.4 mmol/L (ref 0.0–2.0)
Drawn by: 206361
FIO2: 0.21 %
O2 Saturation: 96.7 %
pCO2 arterial: 35.9 mmHg (ref 35.0–45.0)
pO2, Arterial: 82.2 mmHg (ref 80.0–100.0)

## 2010-07-06 LAB — URINALYSIS, ROUTINE W REFLEX MICROSCOPIC
Bilirubin Urine: NEGATIVE
Bilirubin Urine: NEGATIVE
Glucose, UA: NEGATIVE mg/dL
Hgb urine dipstick: NEGATIVE
Ketones, ur: NEGATIVE mg/dL
Ketones, ur: NEGATIVE mg/dL
Nitrite: NEGATIVE
Protein, ur: NEGATIVE mg/dL
Specific Gravity, Urine: 1.025 (ref 1.005–1.030)
Urobilinogen, UA: 1 mg/dL (ref 0.0–1.0)
pH: 5.5 (ref 5.0–8.0)

## 2010-07-06 LAB — CULTURE, BLOOD (ROUTINE X 2)

## 2010-07-06 LAB — VANCOMYCIN, TROUGH: Vancomycin Tr: 17.8 ug/mL (ref 10.0–20.0)

## 2010-07-06 LAB — PROTIME-INR: INR: 1.05 (ref 0.00–1.49)

## 2010-07-06 LAB — ABO/RH: ABO/RH(D): O POS

## 2010-07-09 LAB — RAPID STREP SCREEN (MED CTR MEBANE ONLY): Streptococcus, Group A Screen (Direct): NEGATIVE

## 2010-08-09 ENCOUNTER — Encounter: Payer: Self-pay | Admitting: Internal Medicine

## 2010-08-16 NOTE — Assessment & Plan Note (Signed)
OFFICE VISIT   LEONE, PUTMAN  DOB:  March 29, 1946                                       01/19/2009  DGUYQ#:[id number]   ADDENDUM:  His September 20 note lacked a history and physical.  Date of  admission is November 3.   CHIEF COMPLAINT:  Infrarenal abdominal aortic aneurysm.   Office note addendum.   This patient was evaluated for an infrarenal abdominal aortic aneurysm  and CT angiogram performed which revealed that he is a good candidate  for aortic stent grafting.  I discussed this at length with him in the  office today regarding the technique, risks, benefits, etc.  He had a  Cardiolite study performed last week which reveals normal study with no  evidence of ischemia and a good LV function, 71% ejection fraction.   On exam today blood pressure 107/66, heart rate 73, respirations 14.  Carotid pulses are 3+, no bruits.  Abdomen soft, nontender with a  pulsatile mass measuring approximately 5 cm.  He has 3+ femoral pulses  bilaterally with palpable posterior tibial pulses bilaterally at 2+.   CT scan reveals the aneurysm to be 5.6 cm in maximum diameter.  He has  been scheduled for an aortic stent grafting with a Gore stent graft on  Wednesday November 3.  The risks, benefits have been thoroughly  discussed and he would like to proceed.   Quita Skye Hart Rochester, M.D.  Electronically Signed   JDL/MEDQ  D:  01/19/2009  T:  01/20/2009  Job:  [id number]

## 2010-08-16 NOTE — Procedures (Signed)
NAME:  Dylan Morgan, Dylan Morgan                ACCOUNT NO.:  [id number]   MEDICAL RECORD NO.:  [id number]           PATIENT TYPE:  OUT   LOCATION:  SLEEP CENTER                 FACILITY:  [facility]   PHYSICIAN:  Clinton D. Maple Hudson, MD, FCCP, FACPDATE OF BIRTH:  Jul 27, 1943   DATE OF STUDY:  02/12/2007                            NOCTURNAL POLYSOMNOGRAM   REFERRING PHYSICIAN:  Tacey Ruiz, MD   INDICATION FOR STUDY:  Hypersomnia with sleep apnea.   Epworth sleepiness score 12/34.  BMI 35.  Weight 251 pounds.  Height 71  inches.  Neck 19 inches.   HOME MEDICATIONS:  Listed and reviewed.   SLEEP ARCHITECTURE:  Total sleep time 251 minutes with sleep efficiency  67%.  Stage I was 8%.  Stage II 70%.  Stage III absent.  REM 21% of  total sleep time.  Sleep latency 22 minutes.  REM latency 80 minutes.  Awake after sleep onset 100 minutes.  Arousal index 7.2.  No bedtime  medication taken.   RESPIRATORY DATA:  Apnea-hypopnea index (AHI, RDI) 4.5 obstructive  events per hour, which is within normal limits (normal range 0-5 per  hour).  There were a total of 19 obstructive events, all hypopneas.  Events were not positional.  REM AHI 2.3.   RESPIRATORY DATA:  Loud snoring with oxygen desaturation to a nadir of  86%.  Mean oxygen saturation through the study was 93% on room air.   CARDIAC DATA:  Sinus rhythm with occasional PAC.   MOVEMENT/PARASOMNIA:  No significant movement disturbance.  Bathroom x1.   IMPRESSION/RECOMMENDATION:  1. Short total sleep time.  The patient complained of back pain and      was awake from about 2:45 a.m. asking to end the study at 4 a.m.  2. Occasional obstructive respiratory events, all hypopneas, apnea-      hypopnea index 4.5 per hour (normal range 0-      5 per hour).  This is insufficient to make a diagnosis of      obstructive sleep apnea syndrome.  Events were not positional, but      it is likely that weight loss and efforts to sleep off flat of back      would be  helpful.      Clinton D. Maple Hudson, MD, [employer], FACP  Diplomate, Biomedical engineer of Sleep Medicine  Electronically Signed     CDY/MEDQ  D:  02/17/2007 13:40:33  T:  02/18/2007 09:13:02  Job:  [id number]

## 2010-08-16 NOTE — Assessment & Plan Note (Signed)
OFFICE VISIT   RAND, ETCHISON  DOB:  07-07-1945                                       03/16/2009  ZOXWR#:[id number]   The patient returns today for followup regarding his aortic stent graft  procedure which was performed by me on 11/03.  He required a  rehospitalization on November 24 for MRSA infection in the right  inguinal wound which is superficial.  That has been treated with  outpatient IV vancomycin and the home health nurse is doing daily  dressing changes.  The wound is now healed completely and is unable to  be packed any longer.  He has had no chills, fever or other evidence of  systemic infection and is continuing to get daily vancomycin.   On exam today his abdomen is soft, nontender, with no pulsatile mass.  Right inguinal incision is essentially healed, about 3 mm that needs to  close.  Left groin incision remains well-healed.  Both feet are well-  perfused.  Blood pressure 175/96, heart rate 65, temperature is 98.   He will follow up with infectious disease in the next few weeks and  return to see [place] in 3 months with a CT angiogram to look at the stent  graft.  He had one CT angiogram performed while hospitalized a few weeks  ago which looked good with no endo leaks but because of this infection  we will repeat one in 3 months.   Quita Skye Hart Rochester, M.D.  Electronically Signed   JDL/MEDQ  D:  03/16/2009  T:  03/17/2009  Job:  [id number]

## 2010-08-16 NOTE — Consult Note (Signed)
NEW PATIENT CONSULTATION   Dylan Morgan, HASTEN  DOB:  [birth year]                                       12/21/2008  ZOXWR#:[id number]   The patient is a 65 year old male patient referred by Dr. Shary Key for abdominal aortic aneurysm.  The patient recently had an  abdominal ultrasound and was found to have a 5.6 x 5.3 cm infrarenal  abdominal aortic aneurysm, which previously was unknown.  He was  referred for further evaluation.   PAST MEDICAL HISTORY:  1. Coronary artery disease with previous coronary artery bypass      grafting in 2004.  No myocardial infarction.  2. Hypertension.  3. Hyperlipidemia.  4. GERD.  5. Seborrheic dermatitis.  6. Sleep apnea.  7. Chronic back and neck pain.  8. History of upper respiratory infections.  9. Negative for stroke, diabetes.   PREVIOUS SURGERY:  1. Coronary artery bypass grafting in 2004.  2. Left shoulder surgery following traumatic injury.   FAMILY HISTORY:  Positive for diabetes in his mother.  Stroke in his  father.  Coronary artery disease in 2 brothers.   SOCIAL HISTORY:  He is single, has 4 children.  He works as a Metallurgist.  His heart surgery was done in [place].  He has smoked a pack of  cigarettes per day for 50+ years.   REVIEW OF SYSTEMS:  He has had recent weight gain.  Denies any chest  pain.  He does have some mild dyspnea on exertion and wheezing.  No GI  or GU symptoms.  He has lower extremity discomfort with walking.  He has  had occasional loss of vision in his eyes, headaches, arthritis, joint  pain, nervousness.  All other systems negative.   ALLERGIES:  None known.   MEDICATIONS:  Please see health history exam.   PHYSICAL EXAM:  Blood pressure is 120/81, heart rate is 84, respirations  14.  Generally, he is an obese middle-aged male in no apparent distress.  Alert and oriented x3.  Neck is supple.  3+ carotid pulses palpable.  No  bruits are audible.  Neurologic exam  normal.  No palpable adenopathy in  the neck.  Chest is clear to auscultation.  Cardiovascular exam is  regular rate and rhythm with no murmurs.  Abdomen is obese.  No  pulsatile mass palpated.  He has 3+ femoral, popliteal, and posterior  tibial pulses bilaterally.   IMPRESSION:  1. Infrarenal abdominal aortic aneurysm - 5.6 cm.  2. Remote history of coronary artery disease status post coronary      artery bypass grafting.  3. Questionable history of renal insufficiency.   PLAN:  1. Obtain Cardiolite.  2. Obtain a CT angiogram to see if he is a candidate for aortic stent      grafting.  3. Return to see me in 3 weeks for further discussion of his problems.   Quita Skye Hart Rochester, M.D.  Electronically Signed   JDL/MEDQ  D:  12/21/2008  T:  12/21/2008  Job:  [id number]   cc:   Danne Harbor, MD

## 2010-08-16 NOTE — Assessment & Plan Note (Signed)
[facility]                            CARDIOLOGY OFFICE NOTE   AHMAR, PICKRELL                       MRN:          [id number]  DATE:08/10/2006                            DOB:          11-10-1945    Mr. Scull returns for cardiology followup.  I have seen him on June 11, 2006.  He has known coronary disease.  He is not having chest pain.  He  has persistent cough and sputum production.  He is not having any fever.  He continues to smoke and certainly has chronic bronchitis.  I have  asked  him to be seen in the medicine clinic for follow up of this.  When I saw him last we had arranged for a 2-D echo.  He failed to show  for the visit.  He says that he did not know about it and we are trying  to help him be careful to know exactly when we will do this study.  Otherwise he is stable.   PAST MEDICAL HISTORY:   ALLERGIES:  No known drug allergies.   MEDICATIONS:  1. Aspirin 81 mg.  2. Multivitamin.  3. Lipitor 20 mg.   OTHER MEDICAL PROBLEMS:  See the list on my note of June 11, 2006.   REVIEW OF SYSTEMS:  He is feeling well other than his coughing.  He does  not appear to be ill with this at this time.  He had some chest soreness  that is related to this.  Otherwise a review of systems is negative.   PHYSICAL EXAMINATION:  Weight is 242 pounds and the patient clearly is  significantly overweight.  Blood pressure today is 150/97.  His blood  pressure was normal at the time of his last visit.  He needs followup  blood pressure with his primary team.  Heart rate is 69.  The patient is oriented to person, time and place.  Affect is normal.  He seems to think he needs more antibiotics.  He is not febrile and I  will leave this to his primary team.  LUNGS:  Revealed distant breath sounds.  NECK:  Reveals no jugular venous distension.  He has no carotid bruits.  There is no xanthelasma.  He has normal extraocular motion.  CARDIAC:  Reveals a  S1 with a S2.  There are no clicks or significant  murmurs.  ABDOMEN:  Obese.  He has normal bowel sounds.  He has no significant  peripheral edema.   No labs are done today.   PROBLEM:  1. Listed completely on my note of June 11, 2006.  2. History of ejection fraction in the 40% range.  It is important to      try to reassess his left ventricular function at this time to see      if his medications need to be changed further.  He      clearly needs blood pressure control at this time.  The addition of      a ACE inhibitor or a ARB would the first choice.  We will try  to      get echo data as soon as we can.     Luis Abed, MD, [facility]  Electronically Signed    JDK/MedQ  DD: 08/10/2006  DT: 08/10/2006  Job #: [id number]   cc:   Judie Grieve, MD

## 2010-08-16 NOTE — Assessment & Plan Note (Signed)
OFFICE VISIT   TUAN, TIPPIN  DOB:  01/20/1946                                       04/06/2009  NUUVO#:[id number]   The patient returned today for further follow-up regarding his aortic  stent graft which I placed on November 3.  He then developed a right  inguinal abscess which was treated on November 25 and I saw him in  follow-up 3 weeks ago.  He continues to do well with no chills, fever or  evidence of infection.  His appetite is returning to normal as is his  activity level.  He saw infectious disease and they  discontinued his  PICC line so he is now off antibiotics.   PHYSICAL EXAMINATION:  Inguinal incision continues to heal nicely.  No  evidence for recurrent infection.  He has 3+ femoral pulses bilaterally.  Aneurysm is not palpable.  Blood pressure today 150/90, heart rate is  49, respirations 14, temperature 97.8.   He will return in 2- 1/2 months for CT angiogram to look at the stent  graft per protocol.     Quita Skye Hart [place], M.D.  Electronically Signed   JDL/MEDQ  D:  04/06/2009  T:  04/07/2009  Job:  [id number]

## 2010-08-19 NOTE — Discharge Summary (Signed)
NAME:  Dylan Morgan, Dylan Morgan                          ACCOUNT NO.:  [id number]   MEDICAL RECORD NO.:  [id number]                   PATIENT TYPE:  INP   LOCATION:  3706                                 FACILITY:  [facility]   PHYSICIAN:  Fransisco Hertz, M.D.               DATE OF BIRTH:  Feb 15, 1946   DATE OF ADMISSION:  08/27/2003  DATE OF DISCHARGE:  08/31/2003                                 DISCHARGE SUMMARY   DISCHARGE DIAGNOSES:  1. Right lower extremity cellulitis.  2. Hyperlipidemia.  3. Coronary artery disease.  4. Status post coronary artery bypass grafting in 2004.  5. Gastroesophageal reflux disease.  6  Renal insufficiency.  1. Chronic back and neck pain.  2. Tobacco use.   DISCHARGE MEDICATIONS:  1. Protonix 40 mg daily.  2. Aspirin 81 mg daily.  3. Toprol XL 25 mg daily.  4. Lamisil 1% cream applied to feet daily.  5. Altace 2.5 mg daily.  6. Doxycycline 100 mg b.i.d. x 7 days.  7. Phenergan 25 mg q.6h. p.r.n. nausea.  8. Ibuprofen p.r.n. pain, no more than 2400 mg daily.   FOLLOW UP:  [facility] with Dr. Alfonse Alpers on September 09, 2003, at  11:30 a.m.  At that time, the patient will be evaluated for continuing  resolution of his right lower extremity cellulitis.  The patient should be  evaluated for improving athlete's foot.  The patient should also have  creatinine checked as was increased during hospitalization and showed some  resolution.  Unsure of patient's baseline.  The patient is discharged on low-  dose ACE inhibitor, Altace 2.5 mg.   PROCEDURES:  Chest x-ray on Aug 27, 2003: Immpression, cardiomegaly, poor  inspiration, no definite acute process.   HISTORY OF PRESENT ILLNESS:  The patient is a 65 year old white man with  past medical history significant for coronary artery disease status post  CABG x 3 in July 2004, hyperlipidemia, COPD, tobacco abuse, and obesity who  presented initially to our outpatient clinic and subsequently sent to  emergency  room for fevers.  The patient states he was in his usual state of  health until approximately four days ago when he began feeling a little  shortness of breath, started having a fever.  The patient states following  that, his right lower extremity became red and tender to touch.  The patient  has been without nausea and vomiting and with chills, mild chest pressure.  He has had decreased p.o. intake.  Denies cough, diarrhea, or constipation.   PHYSICAL EXAMINATION:  VITAL SIGNS:  Temperature 101.3, blood pressure  95/38, pulse 61 to 90, respirations 18, O2 saturation 99% on room air.  GENERAL:  Mildly obese man who appears sick but not in apparent distress.  HEENT:  Pupils equal, round, and reactive to light.  No scleral icterus.  Oropharynx clear, no exudate.  NECK:  Supple.  LUNGS:  Left lower lobe had mild crackles at bases, otherwise clear to  auscultation.  CARDIOVASCULAR:  Regular rate and rhythm.  No murmurs, rubs, or gallops.  ABDOMEN:  Soft, nontender, nondistended.  Positive bowel sounds.  No  hepatosplenomegaly.  EXTREMITIES:  Right lower extremity had trace to 1+ edema, good pulse.  There is erythematous, warm-to-touch, macular rash to the right leg from  knee to ankle, small pustules to the right mid calf, old graft scar  secondary to vein harvesting.  Strength is 5/5 throughout.  No significant  change in size between calves bilaterally.  NEUROLOGIC:  Cranial nerves II-XII intact.  No focal deficits.   ADMISSION LABORATORY AND X-RAY DATA:  Sodium 135, potassium 4.1, chloride  103, bicarb 24, BUN 16, creatinine 1.6, glucose 108.  WBC 18, hemoglobin  15.2, platelets 161, ANC 15.6, MCV 88.5.  UA negative.   Chest x-ray:  Cardiomegaly, sternotomy wires, no active disease.   CK 280, MB 0.7, relative index 0.3, troponin 0.01.   HOSPITAL COURSE:  #1.  RIGHT LOWER EXTREMITY CELLULITIS:  The patient was admitted with  fevers, chills, and leukocytosis.  The patient had erythema  and tenderness  to the right lower extremity.  The patient had Doppler study of the right  lower extremity which was negative.  He was started on vancomycin.  Temperatures and white blood cell count trended downward.  Blood cultures  were obtained which were negative x 2.  Possible causes of patient's  cellulitis include inflammation of vein harvesting site leading to  infection.  The patient did have a small pustule to the right calf which  resolved with antibiotics.  The patient also admits to having athlete's foot  and, therefore, was started on Lamisil.  The patient will be discharged on  oral medications, doxycycline 100 mg b.i.d. x 7 days.  He has followup in  our outpatient clinic one week from discharge.   #2.  HYPOTENSION:  On admission, the patient was noted to be running a low  blood pressure.  Blood pressure agents were held.  The patient was given  fluids which improved his status.  No further intervention was needed.   #3.  HYPERTENSION:  The patient was restarted back on his beta blocker.  It  was of note that patient's pulse did run in the 50s while hospitalized on a  beta blocker.  Noted in previous notes, this was consistent with his range  as an outpatient.  The patient at discharge will also be started back on his  ACE inhibitor, Altace 2.5 mg daily.  Secondary to this, the patient will  need to have a BMET checked as an outpatient.   #4.  HYPERLIPIDEMIA:  The patient was continued on his home dose of Lipitor  with LDL of 59, HDL 37.   #5.  CORONARY ARTERY DISEASE STATUS POST CORONARY ARTERY BYPASS GRAFTING:  The patient did complain of some mild chest pressure on admission.  Cardiac  enzymes were negative x 3.  No noted changes on telemetry.  No further chest  pain or shortness of breath while hospitalized.   #6.  GASTROESOPHAGEAL REFLUX DISEASE:  The patient was continued on  Protonix.  #7.  RENAL INSUFFICIENCY:  Unsure of patient's baseline creatinine.  It  is  noted that creatinine did respond to fluids, lowering to a level of 1.3.  The patient will be started back on his low dose of ACE inhibitor, and this  should be checked at his followup appointment on  June 8.   #8.  CHRONIC OBSTRUCTIVE PULMONARY DISEASE:  The patient had no acute  exacerbation and was well controlled while in hospital.   DISCHARGE LABORATORY AND X-RAY DATA:  Sodium 140, potassium 3.9, chloride  107, bicarb 24, glucose 87, BUN 12, creatinine 1.3, calcium 8.8.  WBC 5.3,  hemoglobin 13.3, hematocrit 39, platelets 174.  Urine culture:  No growth.  Blood cultures negative to date x 2.  Total cholesterol 123, triglycerides  134, HDL 37, LDL 59.  TSH 0.426.  Rapid strep screen negative.  Group A  strep probe of throat negative.      Delbert Harness, MD                    Fransisco Hertz, M.D.    Cleotis Lema  D:  08/31/2003  T:  08/31/2003  Job:  [id number]   cc:   Dante Gang, M.D.  Int. Med - Resident - [facility]  [place]  [place] [address]  Fax: [phone]

## 2010-08-19 NOTE — Assessment & Plan Note (Signed)
[facility]                            CARDIOLOGY OFFICE NOTE   SHINE, MIKES                       MRN:          [id number]  DATE:06/11/2006                            DOB:          [birth year]    [name] is seen for cardiology followup. I have a very nice note from  Dr. Shannan Harper at [facility] dated June 08, 2006. There has been treatment of  cough with greenish sputum. He is feeling better in this regard. He has  been advised to stop smoking, he stops for a few days but then returns  with the folks that smoke around him. He does have known coronary  disease. He has a history of MI in the past and bypass surgery. He had  been followed in the past by Dr. Gerri Spore. He is not having any  significant chest pain. His CABG was done in [place] in 2004.   PAST MEDICAL HISTORY:   ALLERGIES:  No known drug allergies.   MEDICATIONS:  1. Aspirin 81.  2. Multivitamin daily.   OTHER MEDICAL PROBLEMS:  See the list below.   LABORATORY DATA:  He had an EKG today showing nonspecific ST-T wave  changes and normal sinus rhythm.   REVIEW OF SYSTEMS:  He has had some mild recurring problems with  inflammation of the vein harvest site in the medial upper leg near his  right groin. He says it was treated in the hospital once. More recently  it felt inflamed. It feels better today. Otherwise, his review of  systems is negative.   PHYSICAL EXAMINATION:  VITAL SIGNS:  Blood pressure is 126/83 with a  pulse of 62.  GENERAL:  The patient is oriented to person, time and place.  HEENT:  Affect is normal. There is no xanthelasma. He has normal  extraocular motion. There are no carotid bruits. There is no jugular  venous distention.  LUNGS:  Clear. Respiratory effort is not labored.  CARDIAC:  Exam reveals an S1 with an S2. There are no clicks or  significant murmurs.  ABDOMEN:  Obese. There are no masses or bruits.  EXTREMITIES:  He has no significant peripheral  edema at this time.  Evaluation of his right inner thigh at the vein harvest site reveals  that there is some mild redness. There is no heat and no swelling.   PROBLEM LIST:  1. History of CABG. I believe this was done in 2004. At that time we      think he had LIMA to the LAD, vein graft to OM. Vein graft to the      posterior descending.  2. History of ejection fraction in the 40% range and I do not know      that it has been checked since that time.  3. History of hypertension.  4. Hyperlipidemia. We will urge him to be on a medication for this and      suggested he get Simvastatin from [employer] if he can at $4 per month.  5. History of asthma and bronchitis.  6. History of some renal insufficiency in  the past with a creatinine      in our chart of 1.5 many years ago.  7. Some postoperative atrial fibrillation around the time of his CABG      but this has not been an ongoing problem.  8. Question of difficulties with the vein harvest site. See the      description above in his right groin. It may be helpful to have him      seen by the vascular surgeons or cardiac surgeons in the future.  9. Ongoing tobacco use. He is being counseled by everyone to stop.  10.Question of what medications he is taking. We will rediscuss this      with him to try to figure this out.   We will push to get him on the appropriate medications and he does need  a 2-D echo so that we can reassess his LV function. I will then see him  back.     Dylan Abed, MD, [facility]  Electronically Signed    JDK/MedQ  DD: 06/11/2006  DT: 06/11/2006  Job #: [id number]   cc:   Judie Grieve, MD

## 2010-08-19 NOTE — Discharge Summary (Signed)
NAME:  Dylan Morgan, Dylan Morgan                ACCOUNT NO.:  [id number]   MEDICAL RECORD NO.:  [id number]          PATIENT TYPE:  INP   LOCATION:  4703                         FACILITY:  [facility]   PHYSICIAN:  C. Ulyess Mort, M.D.DATE OF BIRTH:  1945/06/15   DATE OF ADMISSION:  02/18/2005  DATE OF DISCHARGE:  02/21/2005                                 DISCHARGE SUMMARY   DISCHARGE DIAGNOSES:  1.  Right lower extremity cellulitis, recurrent.  2.  Chest pain.  3.  Gastroesophageal reflux disease.  4.  Tobacco abuse.   DISCHARGE MEDICATIONS:  1.  Aspirin 81 mg p.o. daily.  2.  Lamisil 250 mg p.o. daily for a total of six weeks.  3.  TMP/SMX 160/800 mg one tablet b.i.d. for a total of 14 days.  4.  Protonix 40 mg p.o. daily.   DISPOSITION AND FOLLOWUP:  The patient is to followup with me or Dr.  Ardyth Harps at the [facility] in May 01, 2005 at 1:30  p.m. and he will need to come back in December for a liver function test  since he will be sent out on Lamisil.   CONSULTATIONS:  None.   PROCEDURE:  The patient had a 2-D echo performed on February 20, 2005 that  was consistent with a left ventricular ejection fraction of 55-65%, however,  the study was inadequate for evaluation of left ventricular regional wall  motion.  There was also mild aortic root dilatation and trivial pericardial  effusion posterior to the heart and no obvious vegetation.   HISTORY OF PRESENT ILLNESS:  For full details please refer to the history  and physical examination inserted in the patient's chart but in brief Mr.  Borjon is a 65 year old white male with past medical history of right lower  extremity cellulitis, tobacco abuse, gastroesophageal reflux disease and  coronary artery disease status post CABG in 2004 who presented with an  approximate 2-3 week history of worsening right lower extremity cellulitis.  The leg had progressively become more erythematous and warm to touch.  Was  also  complaining of subjective fevers and chills.  He states for about three  days prior to admission he has also had substernal chest pain that feels  like a knot in his chest.  The intensity is 3/10 that began at rest with no  radiation.  He denies any nausea or vomiting, shortness of breath,  diaphoresis and palpitations.  Of note the patient has lived in [place] for  over 30 years and only moved here in May 2005 which is why we have no  records of his cardiology workup.   ALLERGIES:  No known drug allergies.   CURRENT MEDICATIONS:  None.   SOCIAL HISTORY:  Positive for current tobacco abuse of about one pack a day  for 30 years.  No alcohol or drug use although he does admit to using  marijuana in his youth.  He has Medicaid that becomes into affect in January  2007.  He lives with his son.   FAMILY HISTORY:  Not significant.   REVIEW OF SYSTEMS:  Only positive for fevers, chills, chest pain, some  wheezing, joint pain and weakness.   PHYSICAL EXAMINATION:  VITAL SIGNS:  Temperature 97.9, blood pressure  149/86, heart rate 75, respirations 22, O2 saturation 98% on room air.  RESPIRATORY:  He had diffuse expiratory wheezes.  CARDIOVASCULAR:  Regular rate and rhythm with no murmur, rub, or gallop.  EXTREMITIES:  He had no lower extremity edema, however, his right pretibial  area was erythematous and warm to touch.  Positive tinea pedis that was more  prominent on the right foot.   LABORATORY DATA:  Sodium 134, potassium 3.8, chloride 104, CO2 27, BUN 14,  creatinine 1.2, glucose 89, bilirubin 0.8, alkaline phosphatase 66, AST 36,  ALT 34, protein 7.1, albumin 3.5, calcium 8.7.  WBC 8.9, hemoglobin 14.8,  hematocrit 42.1 with an MCV of 90.5, platelets 152.  AUA was negative.  BNP  was 43.1 and a D-dimer was 6.66.   HOSPITAL COURSE:  Problem 1. Right lower extremity cellulitis.  It is  recurrent is thought to be secondary to the site where he had his vein  harvested for the CABG in  2004.  He also has a tinea pedis fungal infection  that may also be contributing to his cellulitis.  The day the patient was  admitted he was placed on IV vancomycin, however, the next day it was  switched to TMP/SMX IV.  During the patient's hospital stay he showed signs  of significant improvement with cellulitis and erythema decreasing to less  than half the way he came in.  The patient remained afebrile during the  entire hospital stay with no leukocytosis.  On the day of discharge the  patient actually feels that his leg has become less tight and is tender to  touch.  The patient will be discharged on a 14-day course of  trimethoprim/sulfamethoxazole double strength and he will followup with me  at the [facility].  He will also be sent out on a  prescription for a six week course of Lamisil p.o.  He will need to return  in one month time to have liver function tests drawn.   Problem 2. Chest pain.  With his positive history of coronary artery disease  we thought it was important to evaluate his chest pain more closely.  EKG  upon admission showed no significant changes from one done during his  previous and only hospital admission at [name] which was in May 2005  again for right lower extremity cellulitis.  Cardiac enzymes were negative  x3.  However, with his history of coronary artery disease we thought we  would do a 2-D echo to further rule out any problems.  The 2-D echo results  were basically normal and ejection fraction of 55-65%, however, the study  was inadequate for evaluation of left ventricular regional wall  abnormalities.  At this time it is decided that no further workup will be  needed.  On further questioning the patient states that his chest pain  was  more consistent with heartburn as it exacerbates when he lays down on the  bed after eating.  Problem 3. Gastroesophageal reflux disease.  The patient was placed on  Protonix and will also be  discharged on Protonix p.o.   Problem 4. Tobacco abuse.  I have very intensively counseled the patient on  tobacco cessation and have explained to him how this could significantly  worsen his coronary artery disease and his risk of lung issues  and even lung  cancer.   DISCHARGE PHYSICAL EXAMINATION:  Discharge vital signs temperature 97.8,  blood pressure 122/71, heart rate 56, respirations 20, O2 saturation 97% on  room air.   DISCHARGE LABORATORY:  Sodium 138, potassium 4.2, chloride 105, CO2 23, BUN  12, creatinine 1.2, glucose 102, calcium 9.0.  WBC 7.1, hemoglobin 14.4 with  an MCV of 90.6 and a platelet count of 186.      Peggye Pitt, M.D.    ______________________________  C. Ulyess Mort, M.D.    EH/MEDQ  D:  02/21/2005  T:  02/21/2005  Job:  [id number]   cc:   Coralie Carpen, M.D.  Fax: [phone]

## 2010-12-27 LAB — CBC
Hemoglobin: 15.5
MCHC: 34.4
RBC: 4.99
WBC: 12.9 — ABNORMAL HIGH

## 2010-12-27 LAB — DIFFERENTIAL
Basophils Relative: 0
Eosinophils Absolute: 0
Eosinophils Relative: 0
Neutrophils Relative %: 86 — ABNORMAL HIGH

## 2010-12-27 LAB — URINALYSIS, ROUTINE W REFLEX MICROSCOPIC
Nitrite: NEGATIVE
Protein, ur: NEGATIVE
Specific Gravity, Urine: 1.029
Urobilinogen, UA: 1

## 2010-12-27 LAB — POCT I-STAT, CHEM 8
Glucose, Bld: 132 — ABNORMAL HIGH
HCT: 48
Hemoglobin: 16.3
Potassium: 4.3
Sodium: 136
TCO2: 26

## 2010-12-31 ENCOUNTER — Emergency Department (HOSPITAL_COMMUNITY): Payer: Medicare Other

## 2010-12-31 ENCOUNTER — Inpatient Hospital Stay (HOSPITAL_COMMUNITY)
Admission: EM | Admit: 2010-12-31 | Discharge: 2011-01-03 | DRG: 062 | Disposition: A | Discharge status: Home / Self Care | Payer: Medicare Other | Attending: Neurology | Admitting: Neurology

## 2010-12-31 DIAGNOSIS — I6529 Occlusion and stenosis of unspecified carotid artery: Secondary | ICD-10-CM | POA: Diagnosis present

## 2010-12-31 DIAGNOSIS — I6350 Cerebral infarction due to unspecified occlusion or stenosis of unspecified cerebral artery: Principal | ICD-10-CM | POA: Diagnosis present

## 2010-12-31 DIAGNOSIS — Z7982 Long term (current) use of aspirin: Secondary | ICD-10-CM

## 2010-12-31 DIAGNOSIS — G8190 Hemiplegia, unspecified affecting unspecified side: Secondary | ICD-10-CM | POA: Diagnosis present

## 2010-12-31 DIAGNOSIS — I2510 Atherosclerotic heart disease of native coronary artery without angina pectoris: Secondary | ICD-10-CM | POA: Diagnosis present

## 2010-12-31 DIAGNOSIS — R29810 Facial weakness: Secondary | ICD-10-CM | POA: Diagnosis present

## 2010-12-31 DIAGNOSIS — R4789 Other speech disturbances: Secondary | ICD-10-CM | POA: Diagnosis present

## 2010-12-31 DIAGNOSIS — Z951 Presence of aortocoronary bypass graft: Secondary | ICD-10-CM

## 2010-12-31 DIAGNOSIS — E669 Obesity, unspecified: Secondary | ICD-10-CM | POA: Diagnosis present

## 2010-12-31 LAB — DIFFERENTIAL
Basophils Absolute: 0 10*3/uL (ref 0.0–0.1)
Basophils Relative: 1 % (ref 0–1)
Eosinophils Absolute: 0.1 10*3/uL (ref 0.0–0.7)
Eosinophils Relative: 2 % (ref 0–5)
Lymphocytes Relative: 37 % (ref 12–46)
Monocytes Absolute: 0.7 10*3/uL (ref 0.1–1.0)

## 2010-12-31 LAB — CBC
HCT: 42.4 % (ref 39.0–52.0)
MCHC: 34.4 g/dL (ref 30.0–36.0)
Platelets: 140 10*3/uL — ABNORMAL LOW (ref 150–400)
RDW: 13.5 % (ref 11.5–15.5)
WBC: 6.3 10*3/uL (ref 4.0–10.5)

## 2010-12-31 LAB — POCT I-STAT, CHEM 8
Creatinine, Ser: 1.3 mg/dL (ref 0.50–1.35)
Glucose, Bld: 132 mg/dL — ABNORMAL HIGH (ref 70–99)
HCT: 44 % (ref 39.0–52.0)
Hemoglobin: 15 g/dL (ref 13.0–17.0)
Potassium: 4 mEq/L (ref 3.5–5.1)
TCO2: 24 mmol/L (ref 0–100)

## 2010-12-31 LAB — GLUCOSE, CAPILLARY: Glucose-Capillary: 137 mg/dL — ABNORMAL HIGH (ref 70–99)

## 2011-01-01 ENCOUNTER — Inpatient Hospital Stay (HOSPITAL_COMMUNITY): Payer: Medicare Other

## 2011-01-01 DIAGNOSIS — I517 Cardiomegaly: Secondary | ICD-10-CM

## 2011-01-01 LAB — CK TOTAL AND CKMB (NOT AT ARMC)
CK, MB: 3.9 ng/mL (ref 0.3–4.0)
Total CK: 135 U/L (ref 7–232)

## 2011-01-01 LAB — COMPREHENSIVE METABOLIC PANEL
ALT: 34 U/L (ref 0–53)
ALT: 31 U/L (ref 0–53)
Albumin: 3.4 g/dL — ABNORMAL LOW (ref 3.5–5.2)
Alkaline Phosphatase: 81 U/L (ref 39–117)
BUN: 25 mg/dL — ABNORMAL HIGH (ref 6–23)
CO2: 25 mEq/L (ref 19–32)
Calcium: 9.8 mg/dL (ref 8.4–10.5)
Chloride: 102 mEq/L (ref 96–112)
Creatinine, Ser: 1.22 mg/dL (ref 0.50–1.35)
GFR calc Af Amer: >60 mL/min (ref >60)
GFR calc non Af Amer: 59 mL/min — ABNORMAL LOW (ref >60)
Glucose, Bld: 131 mg/dL — ABNORMAL HIGH (ref 70–99)
Potassium: 4.2 mEq/L (ref 3.5–5.1)
Sodium: 138 mEq/L (ref 135–145)
Total Bilirubin: 0.3 mg/dL (ref 0.3–1.2)
Total Protein: 7.9 g/dL (ref 6.0–8.3)
Total Protein: 7.2 g/dL (ref 6.0–8.3)

## 2011-01-01 LAB — CBC
HCT: 39.6 % (ref 39.0–52.0)
Hemoglobin: 13.3 g/dL (ref 13.0–17.0)
MCHC: 33.6 g/dL (ref 30.0–36.0)
MCV: 90.2 fL (ref 78.0–100.0)
RDW: 13.5 % (ref 11.5–15.5)

## 2011-01-01 LAB — HEMOGLOBIN A1C
Hgb A1c MFr Bld: 5.9 % — ABNORMAL HIGH (ref <5.7)
Mean Plasma Glucose: 123 mg/dL — ABNORMAL HIGH (ref <117)

## 2011-01-01 LAB — LIPID PANEL
LDL Cholesterol: 68 mg/dL (ref 0–99)
Total CHOL/HDL Ratio: 4.8 RATIO
VLDL: 59 mg/dL — ABNORMAL HIGH (ref 0–40)

## 2011-01-01 LAB — APTT
aPTT: 32 seconds (ref 24–37)
aPTT: 32 seconds (ref 24–37)

## 2011-01-01 LAB — CARDIAC PANEL(CRET KIN+CKTOT+MB+TROPI)
CK, MB: 3.5 ng/mL (ref 0.3–4.0)
Troponin I: <0.3 ng/mL (ref <0.30)

## 2011-01-01 LAB — MRSA PCR SCREENING: MRSA by PCR: NEGATIVE

## 2011-01-01 LAB — GLUCOSE, CAPILLARY: Glucose-Capillary: 108 mg/dL — ABNORMAL HIGH (ref 70–99)

## 2011-01-01 LAB — PROTIME-INR: Prothrombin Time: 12.8 seconds (ref 11.6–15.2)

## 2011-01-01 MED ORDER — GADOBENATE DIMEGLUMINE 529 MG/ML IV SOLN
20.0000 mL | Freq: Once | INTRAVENOUS | Status: AC | PRN
  Administered 2011-01-01: 20 mL via INTRAVENOUS

## 2011-01-01 NOTE — H&P (Signed)
NAMEANHAD, SHEELEY NO.:  [id number]  MEDICAL RECORD NO.:  [id number]  LOCATION:  3106                         FACILITY:  [facility]  PHYSICIAN:  Kipp Laurence, MD DATE OF BIRTH:  [birth year]  DATE OF ADMISSION:  12/31/2010 DATE OF DISCHARGE:                             HISTORY & PHYSICAL   TIME OF CONSULTATION:  2326 hours.  CONSULTING PHYSICIAN:  Eber Hong, MD  CHIEF COMPLAINT:  Left-sided weakness and numbness.  HISTORY OF PRESENT ILLNESS:  Mr. Taha is a 65 year old white man with history of obesity, coronary artery disease status post three-vessel bypass graft in 2008, and AAA status post endograft repair in 2008 presenting with acute onset of left-sided weakness and numbness beginning at 10:30 p.m. this evening.  The patient reports that he was at home watching television when he suddenly noted that his left arm and leg became heavy.  He tried to get up and found that he was unable to move them well.  He also noted they felt numb.  EMS was called.  When on the phone, the patient noted that his speech seemed slurred.  Upon arrival of the EMS, they also noted a left facial droop.  The patient complained of a mild headache at that time.  He was brought as a code stroke to the [facility] ED.  CT head was performed on admission, which was negative for acute bleed or acute abnormality.  Given his presenting symptoms and lack of contraindications, IV t-PA was discussed with the patient, who was agreeable to this and understood the risks and benefits involved.  As such, IV t-PA was initiated at 2356 p.m.  Bolus was performed without complication.  The patient continues to receive his infusion at this time.  He right now reports mild improvement of his left-sided weakness and numbness.  PAST MEDICAL HISTORY: 1. Coronary artery disease, status post three-vessel bypass in 2008. 2. Abdominal aortic aneurysm, status post endograft repair in 2008. 3.  Obesity.  MEDICATIONS: 1. Aspirin 81 mg daily. 2. Multivitamin daily.  ALLERGIES:  No known drug allergy.  FAMILY HISTORY:  Positive for coronary artery disease in his brother. Diabetes in his mother.  No family history of strokes.  SOCIAL HISTORY:  The patient reports that he has a previous smoking history and quit smoking 3 years ago.  He reports that he rarely drinks and has no history of excessive alcohol use.  He denies illicit drug use.  The patient lives with his son.  REVIEW OF SYSTEMS:  A complete 10 system review of systems was obtained and is negative except as noted in the HPI.  PHYSICAL EXAMINATION:  VITAL SIGNS:  Temperature 97.1, blood pressure 132/86, heart rate 54, respiratory rate 16, O2 saturation 97%. CARDIOVASCULAR:  Regular rate and rhythm.  No murmurs, gallops, or rubs. No carotid bruits auscultated. LUNGS:  Upper airway noise and mild end-expiratory wheezing. ABDOMEN:  Soft, nontender, nondistended. NEUROLOGIC:  Mental status:  The patient is alert and oriented x3, and is able to follow multi-step commands.  Speech is mildly dysarthric. Language is fluent.  Memory is intact to recent and remote events. Attention and concentration are normal.  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  Right visual fields are full.  III, IV, VI- Right pupil is reactive.  Left pupil is 3 mm and poorly responsive.  Right pupil does have some indirect response.  Extraocular movements are intact.  No nystagmus.  V- Decreased sensation over the left side of the face.  No weakness of masticatory muscles.  VII- Left lower facial weakness. VIII- Auditory acuity is intact bilaterally.  IX and X- Uvula is midline.  Palate elevates symmetrically.  XI- 5/5 strength in bilateral sternocleidomastoid and trapezius.  XII- Tongue is midline, does not deviate.  Motor exam:  4-/5 strength in the left upper and lower extremity,  5/5 on the right.  Significant left upper extremity and lower extremity drift.  Sensation:  Decreased to light touch on the left upper and lower extremities compared to the right.  Reflexes:  2+ deep tendon reflexes throughout.  Plantar responses are downgoing bilaterally. Coordination:  Intact finger-nose-finger and heel-to-shin on the right, unable to perform on the left secondary to weakness.  Gait:  Gait testing deferred secondary to marked weakness.    NIH stroke scale score: 6.  TESTING RESULTS:  BMP and CBC are unremarkable.  Coags are within normal limits.  First set of cardiac enzymes is negative.  EKG demonstrates normal sinus rhythm.  Imaging:  CT of the head demonstrates chronic small vessel disease.  No acute abnormalities noted.  ASSESSMENT:  This is a 65 year old white man with history of coronary artery disease and obesity, presenting with acute onset left-sided weakness and numbness at 10:30 p.m. concerning for an acute stroke, likely in the right MCA territory.  Given his lack of contraindications and the fact that he falls within the stroke time window, IV t-PA was initiated in the emergency department and is being completedin ICU.  The patient does demonstrate some mild improvement on his neurologic exam at present while receiving t-PA.  PLAN: 1. Admit to Neuro ICU under stroke attending. 2. Finish t-PA infusion and initiate t-PA precautions. 3. Every 15-minute neuro checks x4, then q.4-hour neuro checks and     repeat CT imaging for any significant change. 4. Plan for MRI brain with and without tomorrow. 5. Two-D echocardiogram. 6. Transcranial Dopplers and carotid ultrasound. 7. We will plan to risk stratify with hemoglobin A1c, TSH, and lipid     profile. 8. After 24-hour t-PA window, we will plan to initiate Plavix 75 mg     daily.  The patient reports that he had been on this previously,     but stopped taking it several years ago and was switched to a  baby     aspirin. 9. Failed bedside swallow evaluation. Will be NPO for now with speech consult tomorrow for formal swallow       study.          ______________________________ Kipp Laurence, MD     ES/MEDQ  D:  01/01/2011  T:  01/01/2011  Job:  [id number]  Electronically Signed by Kipp Laurence MD on 01/01/2011 04:30:39 AM

## 2011-01-02 ENCOUNTER — Inpatient Hospital Stay (HOSPITAL_COMMUNITY): Payer: Medicare Other

## 2011-01-02 LAB — URINALYSIS, ROUTINE W REFLEX MICROSCOPIC
Bilirubin Urine: NEGATIVE
Glucose, UA: NEGATIVE mg/dL
Ketones, ur: NEGATIVE mg/dL
Leukocytes, UA: NEGATIVE
Nitrite: NEGATIVE
Specific Gravity, Urine: 1.014 (ref 1.005–1.030)
pH: 5.5 (ref 5.0–8.0)

## 2011-01-02 LAB — GLUCOSE, CAPILLARY
Glucose-Capillary: 132 mg/dL — ABNORMAL HIGH (ref 70–99)
Glucose-Capillary: 127 mg/dL — ABNORMAL HIGH (ref 70–99)
Glucose-Capillary: 133 mg/dL — ABNORMAL HIGH (ref 70–99)

## 2011-01-02 MED ORDER — IOHEXOL 350 MG/ML SOLN
50.0000 mL | Freq: Once | INTRAVENOUS | Status: AC | PRN
  Administered 2011-01-02: 50 mL via INTRAVENOUS

## 2011-01-03 LAB — COMPREHENSIVE METABOLIC PANEL
AST: 33 U/L (ref 0–37)
Albumin: 3.5 g/dL (ref 3.5–5.2)
BUN: 19 mg/dL (ref 6–23)
Calcium: 9.7 mg/dL (ref 8.4–10.5)
Chloride: 99 mEq/L (ref 96–112)
Creatinine, Ser: 1.13 mg/dL (ref 0.50–1.35)
Total Bilirubin: 0.4 mg/dL (ref 0.3–1.2)
Total Protein: 7.5 g/dL (ref 6.0–8.3)

## 2011-01-03 LAB — MAGNESIUM: Magnesium: 1.8 mg/dL (ref 1.5–2.5)

## 2011-01-04 LAB — GLUCOSE, CAPILLARY: Glucose-Capillary: 116 mg/dL — ABNORMAL HIGH (ref 70–99)

## 2011-01-08 NOTE — Discharge Summary (Signed)
Dylan Morgan, Dylan Morgan                ACCOUNT NO.:  [id number]  MEDICAL RECORD NO.:  [id number]  LOCATION:  3106                         FACILITY:  [facility]  PHYSICIAN:  Pramod P. Pearlean Brownie, MD    DATE OF BIRTH:  Oct 18, 1945  DATE OF ADMISSION:  12/31/2010 DATE OF DISCHARGE:  01/03/2011                              DISCHARGE SUMMARY   DIAGNOSES AT TIME OF DISCHARGE: 1. Right perirolandic subcortical infarct, small, status post IV t-PA. 2. Chronic left internal carotid artery occlusion. 3. Coronary artery disease status post coronary artery bypass graft in     2008. 4. Abdominal aortic aneurysm status post graft in 2008. 5. Obesity with body mass index 35.3. 6. Left hemiparesis.  MEDICINES AT TIME OF DISCHARGE: 1. Plavix 75 mg a day. 2. Multivitamin 1 a day.  STUDIES PERFORMED: 1. CT of the brain on admission shows no acute abnormality.  Atrophy     with small-vessel disease.   2. MRI of the brain shows small acute     infarcts in the right perirolandic cortex and subcortical white     matter right MCA territory.  No mass effect or hemorrhage.  Chronic     left ICA occlusion.  Nonspecific white matter changes. 3. CT angio of the neck shows moderate narrowing proximal right      subclavian artery, occluded left internal carotid artery, right     bifurcation irregularity, with maximal stenosis at 44%. Posterior     right upper lobe opacity possibly representing atelectasis or     scarring over 1.5 cm.  CT of the chest can be obtained if needed. 4. CTA of the head shows occluded left internal carotid artery.     Reconstitution of flow left internal carotid artery, supraclinoid     segment, carotid terminus.  Ectasia of the cavernous segment of the     right internal carotid artery.  Fetal-type origin of the posterior     cerebral arteries.  Branch vessel irregularity.  Acute right     parietal lobe infarct better demonstrated on recent MRI.  Moderate     small vessel disease.  Atrophy  without hydrocephalus.  Post banding     left lobe. 5. A 2D echocardiogram shows EF of 60-65% with no obvious source of     embolus. 6. Carotid Doppler shows no right ICA stenosis.  Vertebral artery flow     is too slow consistent with and innominate obstruction.  Left ICA     occluded.  Left vertebral artery is antegrade. 7. EKG shows sinus bradycardia, left axis deviation, nonspecific T-     wave abnormality.  LABORATORY STUDIES:  Magnesium 1.8.  Chemistry with glucose 122, BUN 19, creatinine 1.13, GFR 65.  Urinalysis negative.  Hemoglobin A1c 5.9. Cholesterol 160, triglycerides 297, HDL 33, LDL 68.  Cardiac enzymes normal.  PT/PTT normal.  MRSA screening negative.  HISTORY OF PRESENT ILLNESS:  Dylan Morgan is a 65 year old right-handed Caucasian male who presented with acute onset of left-sided weakness and numbness that began at 10:30 p.m. on the evening of admission.  The patient reports he was at home watching television when he suddenly noticed that his left arm  and leg became heavy.  He tried to get up and found that he was unable to move them well.  He also noted they felt numb.  He called EMS.  When talking on the phone, the patient noticed his speech was slurred.  Upon arrival of EMS, they also noted left facial droop.  The patient complained of headache.  He was brought to the emergency room at [facility] as a code stroke.  CT of the head was performed, which was negative for acute abnormality.  Given his presenting symptoms and lack of contraindications, IV t-PA was discussed and the patient who was agreeable to this who understood the risk and benefits involved, received IV t-PA.  The patient tolerated t-PA without difficulty.  He was admitted to the neuro ICU for further monitoring and evaluation.  HOSPITAL COURSE:  The patient tolerated t-PA without difficulty. Post-tPA imaging showed no hemorrhage.  The patient was started on aspirin for secondary stroke  prevention.  He was then changed to Plavix for secondary stroke prevention as he was on aspirin at home prior to admission.  The patient was found to have a chronically occluded left internal carotid artery as well as a possibly occluded left innominate.  CT angio confirmed there was no significant stenosis on the right side contributing to his stroke.  The patient has been advised to go home with ongoing risk factor control by his primary care physician.  The patient was evaluated by PT, OT, and Speech Therapy.  PT recommended outpatient physical therapy.  The patient had no OT or speech therapy needs.  CONDITION AT TIME OF DISCHARGE:  The patient alert and oriented to person and place and situation.  Eye movements are full.  He has no drift in his upper extremities.  He does have decreased fine motor movement on his left.  He orbits his right arm over his left.  He had no lower extremity weakness though his gait is slightly unstable.  His heart rate is regular.  His breath sounds are clear.  DISCHARGE/PLAN: 1. Discharge to home with family. 2. Outpatient physical therapy. 3. Plavix for secondary stroke prevention. 4. Ongoing risk factor control by primary care physician. 5. Followup primary care physician within 1 month. 6. Primary care to review right upper lobe opacity on CT angion of neck;     further testing per them if needed. 7. Followup Dr. Pearlean Brownie in his office in 2 months.     Annie Main, N.P.   ______________________________ Sunny Schlein. Pearlean Brownie, MD    SB/MEDQ  D:  01/03/2011  T:  01/04/2011  Job:  [id number]  Electronically Signed by Annie Main N.P. on 01/05/2011 04:00:05 PM Electronically Signed by Delia Heady MD on 01/08/2011 10:28:23 PM

## 2011-01-10 ENCOUNTER — Ambulatory Visit: Payer: Medicare Other | Admitting: Physical Therapy

## 2011-01-10 ENCOUNTER — Ambulatory Visit: Payer: Medicare Other | Attending: Neurology | Admitting: Occupational Therapy

## 2011-01-24 ENCOUNTER — Emergency Department (HOSPITAL_COMMUNITY): Payer: Medicare Other

## 2011-01-24 ENCOUNTER — Encounter: Payer: Self-pay | Admitting: Internal Medicine

## 2011-01-24 ENCOUNTER — Inpatient Hospital Stay (HOSPITAL_COMMUNITY)
Admission: EM | Admit: 2011-01-24 | Discharge: 2011-01-26 | DRG: 149 | Disposition: A | Discharge status: Home / Self Care | Payer: Medicare Other | Attending: Emergency Medicine | Admitting: Emergency Medicine

## 2011-01-24 ENCOUNTER — Encounter (HOSPITAL_COMMUNITY): Payer: Self-pay | Admitting: Radiology

## 2011-01-24 DIAGNOSIS — G44209 Tension-type headache, unspecified, not intractable: Secondary | ICD-10-CM | POA: Diagnosis present

## 2011-01-24 DIAGNOSIS — E785 Hyperlipidemia, unspecified: Secondary | ICD-10-CM | POA: Diagnosis present

## 2011-01-24 DIAGNOSIS — I6529 Occlusion and stenosis of unspecified carotid artery: Secondary | ICD-10-CM | POA: Diagnosis present

## 2011-01-24 DIAGNOSIS — Z87891 Personal history of nicotine dependence: Secondary | ICD-10-CM

## 2011-01-24 DIAGNOSIS — J449 Chronic obstructive pulmonary disease, unspecified: Secondary | ICD-10-CM | POA: Diagnosis present

## 2011-01-24 DIAGNOSIS — K219 Gastro-esophageal reflux disease without esophagitis: Secondary | ICD-10-CM | POA: Diagnosis present

## 2011-01-24 DIAGNOSIS — I69959 Hemiplegia and hemiparesis following unspecified cerebrovascular disease affecting unspecified side: Secondary | ICD-10-CM

## 2011-01-24 DIAGNOSIS — R42 Dizziness and giddiness: Principal | ICD-10-CM | POA: Diagnosis present

## 2011-01-24 DIAGNOSIS — J4489 Other specified chronic obstructive pulmonary disease: Secondary | ICD-10-CM | POA: Diagnosis present

## 2011-01-24 DIAGNOSIS — I10 Essential (primary) hypertension: Secondary | ICD-10-CM | POA: Diagnosis present

## 2011-01-24 DIAGNOSIS — Z7902 Long term (current) use of antithrombotics/antiplatelets: Secondary | ICD-10-CM

## 2011-01-24 DIAGNOSIS — Z6835 Body mass index (BMI) 35.0-35.9, adult: Secondary | ICD-10-CM

## 2011-01-24 DIAGNOSIS — I498 Other specified cardiac arrhythmias: Secondary | ICD-10-CM | POA: Diagnosis present

## 2011-01-24 DIAGNOSIS — I2510 Atherosclerotic heart disease of native coronary artery without angina pectoris: Secondary | ICD-10-CM | POA: Diagnosis present

## 2011-01-24 DIAGNOSIS — Z951 Presence of aortocoronary bypass graft: Secondary | ICD-10-CM

## 2011-01-24 DIAGNOSIS — E669 Obesity, unspecified: Secondary | ICD-10-CM | POA: Diagnosis present

## 2011-01-24 LAB — PROTIME-INR
INR: 0.92 (ref 0.00–1.49)
Prothrombin Time: 12.6 seconds (ref 11.6–15.2)

## 2011-01-24 LAB — CBC
HCT: 41.9 % (ref 39.0–52.0)
Hemoglobin: 14.1 g/dL (ref 13.0–17.0)
MCH: 30.5 pg (ref 26.0–34.0)
MCHC: 33.7 g/dL (ref 30.0–36.0)
MCV: 90.7 fL (ref 78.0–100.0)
Platelets: 123 10*3/uL — ABNORMAL LOW (ref 150–400)
RBC: 4.62 MIL/uL (ref 4.22–5.81)
RDW: 13.6 % (ref 11.5–15.5)
WBC: 4.1 10*3/uL (ref 4.0–10.5)

## 2011-01-24 LAB — COMPREHENSIVE METABOLIC PANEL
AST: 49 U/L — ABNORMAL HIGH (ref 0–37)
Albumin: 3.9 g/dL (ref 3.5–5.2)
Calcium: 10.1 mg/dL (ref 8.4–10.5)
Creatinine, Ser: 1.33 mg/dL (ref 0.50–1.35)
Total Protein: 8.2 g/dL (ref 6.0–8.3)

## 2011-01-24 LAB — CK TOTAL AND CKMB (NOT AT ARMC)
CK, MB: 4.8 ng/mL — ABNORMAL HIGH (ref 0.3–4.0)
Relative Index: 2.2 (ref 0.0–2.5)
Total CK: 218 U/L (ref 7–232)

## 2011-01-24 LAB — DIFFERENTIAL
Basophils Absolute: 0 10*3/uL (ref 0.0–0.1)
Basophils Relative: 1 % (ref 0–1)
Eosinophils Absolute: 0.1 10*3/uL (ref 0.0–0.7)
Monocytes Relative: 11 % (ref 3–12)
Neutro Abs: 2.5 10*3/uL (ref 1.7–7.7)
Neutrophils Relative %: 61 % (ref 43–77)

## 2011-01-24 LAB — POCT I-STAT, CHEM 8
BUN: 24 mg/dL — ABNORMAL HIGH (ref 6–23)
Calcium, Ion: 1.23 mmol/L (ref 1.12–1.32)
Chloride: 104 mEq/L (ref 96–112)
Creatinine, Ser: 1.5 mg/dL — ABNORMAL HIGH (ref 0.50–1.35)
Glucose, Bld: 102 mg/dL — ABNORMAL HIGH (ref 70–99)
HCT: 44 % (ref 39.0–52.0)
Hemoglobin: 15 g/dL (ref 13.0–17.0)
Potassium: 4.3 mEq/L (ref 3.5–5.1)
Sodium: 141 mEq/L (ref 135–145)
TCO2: 27 mmol/L (ref 0–100)

## 2011-01-24 LAB — CARDIAC PANEL(CRET KIN+CKTOT+MB+TROPI)
CK, MB: 4.4 ng/mL — ABNORMAL HIGH (ref 0.3–4.0)
Relative Index: 2.4 (ref 0.0–2.5)
Total CK: 182 U/L (ref 7–232)

## 2011-01-24 LAB — APTT: aPTT: 32 seconds (ref 24–37)

## 2011-01-24 LAB — GLUCOSE, CAPILLARY
Glucose-Capillary: 100 mg/dL — ABNORMAL HIGH (ref 70–99)
Glucose-Capillary: 100 mg/dL — ABNORMAL HIGH (ref 70–99)

## 2011-01-24 NOTE — H&P (Signed)
Hospital Admission Note Date: 01/24/2011  Patient name: Dylan Morgan Medical record number: [id number] Date of birth: June 25, 1945 Age: 65 y.o. Gender: male PCP: Margorie John, MD, MD  Medical Service:  Attending physician:   Doneen Poisson, MD   First Contact: Dr. Quentin Ore   Pager: [phone]  Second Contact: Dr. Elyse Jarvis   Pager: [phone]  After 5 pm or weekends: 1st Contact: on call intern    Pager: [phone] 2nd Contact: on call senior resident  Pager: [phone]  Chief Complaint: dizzyness  History of Present Illness: Dylan Morgan is a 65 year old man past medical history history significant for right MCA stroke on 01/01/2011 for which he received TPA, CAD s/p 3 vessel CABG in 2004), and AAA status post endograft repair in 2010, hypertension and obesity, who presents with lightheadedness for 6 hours.  Dylan Morgan states that approximately 11 AM on October 23 he was walking downstairs and started feeling dizzy. He describes this as "staggery-like." He describes being lightheaded, flushed, diaphoretic, nauseated, and with the feeling that he was going to fall over. He states that he felt his heart beating quickly and like he needed to sit down and he went outside and the cool air made him feel better. He denies any chest pain but endorses shortness of breath.  He also describes the headache in his left frontotemporal region and extending down into his left neck, that began just after the episode of dizziness. The headache has been continuous since that time. He describes no slurring of speech, focal weakness, facial droop, double vision, blurry vision, or paresthesias. He denies new onset of numbness, though states he has residual left-sided numbness and weakness from his stroke in September.  Review of Systems: Denies: fevers, chills, nausea, vomiting, diarrhea, change in bowel or bladder function.  Meds: Patient endorses taking only Plavix 75 mg PO daily and  multivitamin. Medications were reconciled at admission.   Allergies: The patient states he has no allergies, however Epic lists, Diltiazem hcl and Oxycodone-acetaminophen  Past Medical History  Diagnosis Date  . CAD (coronary artery disease) CABG in 2004    F/U by Dr Genia Harold cardiology, cardiolite scan done elsewhere in October 2010 that was normal, no ischemia seen, EF 55-65% in 02/2005  . GERD (gastroesophageal reflux disease)   . Hypertension   . Hyperlipidemia   . Renal insufficiency   . Cellulitis and abscess of leg     right leg  . Tobacco abuse   . Chronic back pain   . Chronic neck pain   . Seborrheic dermatitis of scalp   . Abnormal LFTs     Hx of abnormal LFT's  . Bradycardia   . S/P AAA (abdominal aortic aneurysm) repair     November 2010  . Atrial fibrillation 02/22/2009    diltiazem po started but discontinued due to side effects.  Stroke 01/01/11 given TPA which partially reversed his deficits.   Past Surgical History  Procedure Date  . Coronary artery bypass graft 2004  Shoulder surgery, bilateral cateract surgeries,  Family History  Problem Relation Age of Onset  . Diabetes Mother   . Stroke Father   . Cancer Sister     thyroid cancer  . Heart disease 2 Brothers s/p CABG     Coronary artery disease   Social History  . Marital Status: Single    Spouse Name: N/A    Number of Children: 1+  . Years of Education: N/A   Occupational History  . disabled  Social History Main Topics  . Smoking status: Former Smoker quit in 2010 (was 1-2PPD x 40 yrs)  . Smokeless tobacco: Not on file  . Alcohol Use: No  . Drug Use: No   Other Topics Concern  . Not on file   Social History Narrative   Lives with son and his wife. Gets SSI and pays rent to son. Divorced.Regular exercise.     Physical Exam: T: 98  P: 55, BP: 160/86, RR: 15, O2 sat: 98% RA   Physical Exam: General: Obese male in no acute distress; alert, appropriate and cooperative  throughout examination. Head: Normocephalic, atraumatic. Eyes: PERRL, EOMI, No signs of anemia or jaundince. Nose: Mucous membranes moist, not inflammed, nonerythematous. Throat: Oropharynx nonerythematous, no exudate appreciated.  Neck: No deformities, masses, or tenderness noted. Supple, No carotid Bruits, no JVD. Lungs: Normal respiratory effort. Clear to auscultation BL without crackles or wheezes. Heart: bradycardia with regular rhythm. S1 and S2 normal without gallop, murmur, or rubs. Abdomen: BS normoactive. Soft, Nondistended, non-tender. No masses or organomegaly. Extremities: No pretibial edema. Neurologic: A&O X3, CN II - XII are grossly intact. There is subtle left facial droop with smile.  Motor strength is 5/5 in the all 4 extremities, Sensations intact to light touch. Subjective numbness in left V3 trigeminal distribution (Chronic). Brachioradialis and patellar reflexes. No babinski sign (toes down going).   Skin: No visible rashes, scars.  Endorses feeling lightheaded with sitting up from laying down  Lab results: Basic Metabolic Panel:  Basename 01/24/11 1214 01/24/11 1204  NA 141 141  K 4.3 4.2  CL 104 102  CO2 -- 29  GLUCOSE 102* 101*  BUN 24* 21  CREATININE 1.50* 1.33  CALCIUM -- 10.1  MG -- --  PHOS -- --   Liver Function Tests:  Basename 01/24/11 1204  AST 49*  ALT 36  ALKPHOS 99  BILITOT 0.3  PROT 8.2  ALBUMIN 3.9   CBC:  Basename 01/24/11 1214 01/24/11 1204  WBC -- 4.1  NEUTROABS -- 2.5  HGB 15.0 14.1  HCT 44.0 41.9  MCV -- 90.7  PLT -- 123*   Cardiac Enzymes:  Basename 01/24/11 1204  CKTOTAL 218  CKMB 4.8*  CKMBINDEX --  TROPONINI <0.30   CBG Basename 01/24/11 1213 01/24/11 1150  GLUCAP 100* 100*   Hemoglobin A1C:Done 01/01/11 Hemoglobin A1C                           5.9        h      4.6-6.1          %  Fasting Lipid Panel: Done 01/01/11  Result Name                              Result     Abnl   Normal Range     Units       Perf. Loc.  Cholesterol                              160               0-200            mg/dL  Triglyceride-LIPR                        297  h      <150             mg/dL  Cholesterol, HDL                         33         l      >39              mg/dL  Cholesterol, LDL                         68                0-99             mg/dL    Oversized comment, see footnote  1  Cholesterol, VLDL                        59         h      0-40             mg/dL  Total CHOL/HDL Ratio                     4.8      Imaging results:  Ct Head Wo Contrast  01/24/2011  *RADIOLOGY REPORT*  Clinical Data: Code stroke.  Dizziness and headache  CT HEAD WITHOUT CONTRAST  Technique:  Contiguous axial images were obtained from the base of the skull through the vertex without contrast.  Comparison: CT 01/02/2011  Findings: Generalized atrophy particularly in the frontal lobes is unchanged from the prior study.  Patchy hypodensity in the deep white matter bilaterally compatible with chronic microvascular ischemia.  No acute infarct.  Negative for hemorrhage or mass. Calvarium is intact.  IMPRESSION: Atrophy and chronic microvascular ischemia.  No acute abnormality.  Critical Value/emergent results were called by telephone at the time of interpretation on 01/24/2011  at 1215 hours  to  Dr. Denton Lank, who verbally acknowledged these results.  Original Report Authenticated By: Camelia Phenes, M.D.    Other results: EKG: Sinus bradycardia with LAD,  t wave inversions in lateral leads (   Echo from 01/01/11 Left ventricle:  Wall thickness was increased in a pattern of moderate LVH.   Systolic function was normal. The estimated ejection fraction was in the range of 60% to 65%.   Carotid duplex from    - Right - No evidence of ICA stenosis. Vertebral artery flow is abnormal and " To - Fro " consistent with a possibleproximal occlusion/obstruction.   - Left - Occlusion of the ICA. Vertebral artery flow is  Antegrade.   Assessment & Plan by Problem:  1. Dizziness -   The patient describes symptoms that are consistent with either a vasovagal event or symptomatic bradycardia. Sick sinus syndrome is a potential concern given CAD, but he has history of bradycardia since 2010 and was assymtomatic at that time.  Stroke/TIA is unlikely given no acute or localizing signs or symptoms.  We will hold off MRI for now.  Given negative CT for hemorrhagic stroke and current clinical picture, MRI, if positive, would likely show a small area of ischemia.  As such, management would not change from current antiplatelet tx. Similarly given findings on carotid dopplar and 2d Echo on 01/01/11, we will not repeat these today.  -- Admit to tele given bradycardia and to eval  for any tachy-brady episodes -- orthostatic vitals -- EKG -- 2 more sets of cardiac enzymes for rule out cardiac ischemic cause.  -- continue plavix 75mg  daily -- morphine, ondansetron and tylenol for symptomatic relief.    2. HTN - BP is elevated, but no signs of urgency/emergency.  Will allow permissive HTN, even when likelihood of stroke is low.  -- monitor BP, will begin to lower BP tomorrow if still elevated.   ACEi would be likely good 1st choice of antihypertensive, given CAD and bradycardia.    3. CAD - Pt has had CABG and ideally would be taking a beta blocker. However given potentially symtomatic bradycardia, we will hold off at this time.   4. Hyperlipidemia - Pt has a historical diagnosis of this condition. However, LDL in 01/01/11 was <70 off statin therapy.  This is below goal, even given hx of CABG. -- will not reorder lipid panel or start statin at this time   5. Headache - could be 2/2 hypotension, tension headache, or less likely TIA/Stroke. No features that are particualrly concerning.  -- tylenol for pain relief   DVT PPX - LMWH     Quentin Ore, M.D. (PGY1): ____________________________________   Date/ Time:  ____________________________________     R2/3______________________________    ATTENDING: I performed and/or observed a history and physical examination of the patient.  I discussed the case with the residents as noted and reviewed the residents' notes.  I agree with the findings and plan--please refer to the attending physician note for more details.  Signature________________________________  Printed Name_____________________________

## 2011-01-25 DIAGNOSIS — R42 Dizziness and giddiness: Secondary | ICD-10-CM

## 2011-01-25 LAB — CARDIAC PANEL(CRET KIN+CKTOT+MB+TROPI)
Relative Index: 2.5 (ref 0.0–2.5)
Total CK: 139 U/L (ref 7–232)
Troponin I: <0.3 ng/mL (ref <0.30)

## 2011-01-25 LAB — COMPREHENSIVE METABOLIC PANEL
Albumin: 3.2 g/dL — ABNORMAL LOW (ref 3.5–5.2)
Alkaline Phosphatase: 72 U/L (ref 39–117)
BUN: 18 mg/dL (ref 6–23)
Calcium: 9.1 mg/dL (ref 8.4–10.5)
Potassium: 4 mEq/L (ref 3.5–5.1)
Sodium: 141 mEq/L (ref 135–145)
Total Protein: 7 g/dL (ref 6.0–8.3)

## 2011-01-25 LAB — MRSA PCR SCREENING: MRSA by PCR: POSITIVE — AB

## 2011-01-25 LAB — CBC
MCH: 30.5 pg (ref 26.0–34.0)
MCHC: 33.4 g/dL (ref 30.0–36.0)
RDW: 13.5 % (ref 11.5–15.5)

## 2011-01-26 LAB — BASIC METABOLIC PANEL
BUN: 18 mg/dL (ref 6–23)
Chloride: 103 mEq/L (ref 96–112)
GFR calc Af Amer: 63 mL/min — ABNORMAL LOW (ref >90)
GFR calc non Af Amer: 55 mL/min — ABNORMAL LOW (ref >90)
Potassium: 4.7 mEq/L (ref 3.5–5.1)
Sodium: 140 mEq/L (ref 135–145)

## 2011-01-26 LAB — CBC
HCT: 42 % (ref 39.0–52.0)
MCHC: 33.8 g/dL (ref 30.0–36.0)
Platelets: 125 10*3/uL — ABNORMAL LOW (ref 150–400)
RDW: 13.6 % (ref 11.5–15.5)
WBC: 4.8 10*3/uL (ref 4.0–10.5)

## 2011-01-29 NOTE — Consult Note (Signed)
Dylan Morgan, BROSCH NO.:  [id number]  MEDICAL RECORD NO.:  [id number]  LOCATION:  3037                         FACILITY:  [facility]  PHYSICIAN:  Thana Farr, MD    DATE OF BIRTH:  06-Dec-1945  DATE OF CONSULTATION:  01/24/2011 DATE OF DISCHARGE:                                CONSULTATION   REASON FOR CONSULTATION:  Generalized weakness with question of possible stroke.  The patient was brought in as a code stroke.  HISTORY OF PRESENT ILLNESS:  This is a 65 year old Caucasian male who was recently seen on January 01, 2011, for left-sided weakness and numbness.  At that time, the patient was believed to be having a stroke and was given tPA.  On further examination, the patient's MRI on December 14, 2010, did show a small acute infarcts in the right perirolandic cortex.  At that time, the patient had a full stroke workup, which included MRI of brain as noted above.  CT angio of neck, which showed moderate narrowing of the proximal right subclavian artery, chronic occlusion of the left internal carotid artery, right bifurcation irregularity with a maximal stenosis of 44%.  CTA of head showed an occluded left internal carotid artery with a reconstruction of the flow of the left internal carotid artery, supraclinoid segment, carotid terminus.  The 2D echo showed an ejection fraction of 60-65%.  No obvious emboli.  Carotid Dopplers showed no ICA stenosis at that time; however, this was further evaluated by the CTA of the neck.  EKG showed sinus bradycardia in which he continues to be in.  The patient at that time was placed on Plavix and discharged home.  His condition at the time of discharge showed to have decreased fine motor movement in his left hand orbiting of his right arm over his left.  However, no significant weakness in his left upper and lower extremity.  Today, the patient went to work ,was going up a flight of stairs and felt woozy headed.  Due to  fear, he might be having a second stroke, he called EMS and was brought to the emergency department.  In the emergency department, CT of head was negative.  Neurology was consulted.  PAST MEDICAL HISTORY:  Previous right perirolandic cortex subcortical white matter MCA infarct on the right in September, 2012, CABG in 2008, CAD, AAA status post graft repair in 2008.  MEDICATIONS:  The patient is on Plavix and multivitamin.  ALLERGIES:  No known drug allergies.  SOCIAL HISTORY:  The patient does not smoke, drink, or do illicit drugs. He lives at his son's house.  REVIEW OF SYSTEMS:  Negative with the exception above.  PHYSICAL EXAMINATION:  Blood pressure is 147/85, pulse 58, respiration 18, temperature 97.7.  The patient is alert and oriented x3.  Carries out 2 and 3 step commands.  Pupils are equal, round, and reactive to light and accommodating.  Conjugate gaze.  Extraocular movements are intact.  The patient does show a left ptosis; however, he states this is secondary to surgical repair of his retina.  Visual fields are grossly intact.  Face is symmetrical.  Tongue is midline.  Uvula is midline. The patient  shows no dysarthria.  Facial sensation is full to pinprick, light touch.  Coordination, finger-to-nose, and bilaterally are smooth, heel-to-shin is limited, but I did not notice any significant ataxia. The patient's motor strength is 5/5 throughout with exception of some slight weakness of left biceps.  Deep tendon reflexes were depressed throughout.  He has downgoing toes bilaterally.  I did not note any drift in his upper or lower extremities.  Sensation is grossly intact throughout.  LABORATORY DATA:  Today's labs showed a sodium 141, potassium 4.3, chloride 103, CO2 is 27, BUN 24, creatinine of 1.5, glucose of 102, white blood cell count of 4.1, platelets 123.  Hemoglobin 13.0, hematocrit 44.0.  Previous stroke workup did showed that he had an HbA1c of 5.9 and  a fasting lipid panel of showing cholesterol of 160, triglycerides 297, LDL of 68 and an HDL of 33.  CT of head today showed atrophy and chronic microvascular ischemic changes, but no acute infarct.  ASSESSMENT:  This is a 65 year old male recently admitted with a perirolandic infarct in January 01, 2011 for which he recieved tPA.  The patient returns with symptoms of dizziness, but no localizing or lateralizing symptoms.  Due to the patient recently having a stroke within the past month and very generalized symptoms, the patient did not receive tPA.  RECOMMENDATIONS: 1. MRI of brain to further evaluate. 2. Continue Plavix. 3. Stroke Team will follow up diagnostic tests in the morning.     Felicie Morn, PA-C   ______________________________ Thana Farr, MD    DS/MEDQ  D:  01/24/2011  T:  01/25/2011  Job:  [id number]  Electronically Signed by Felicie Morn PA-C on 01/26/2011 10:45:14 AM Electronically Signed by Thana Farr MD on 01/29/2011 05:25:20 PM

## 2011-02-04 NOTE — Discharge Summary (Signed)
NAMECHINEDU, AGUSTIN NO.:  [id number]  MEDICAL RECORD NO.:  [id number]  LOCATION:  3037                         FACILITY:  [facility]  PHYSICIAN:  Doneen Poisson, MD     DATE OF BIRTH:  1945/11/27  DATE OF ADMISSION:  01/24/2011 DATE OF DISCHARGE:  01/26/2011                              DISCHARGE SUMMARY   DISCHARGE DIAGNOSES: 1. Dizziness likely vasovagal, resolved at the time of discharge. 2. Right perirolandic subcortical infarct status post IV tPA in     September 2012. 3. Chronic left internal carotid artery occlusion. 4. Coronary artery disease, status post coronary artery bypass graft     in 2008. 5. Abdominal aortic aneurysm, status post graft in 2008. 6. Obesity with body mass index of 35.3. 7. Residual left hemipareses from cerebrovascular accident in     September 2012.  MEDICATIONS:  Discharge meds with accurate doses include 1. Lisinopril 10 mg take 1 tablet by mouth daily. 2. Clopidogrel 75 mg take 1 tablet by mouth daily with meals. 3. Multivitamins over the counter take 1 tablet by mouth daily.  DISPOSITION AND FOLLOWUP:  Mr. Macomber was discharged home in stable and improved condition from [facility] on January 26, 2011.  He was advised to follow up at his scheduled appointment with Dr. Maisie Fus on February 06, 2011 at 3:00 p.m.  At that time, his symptoms of dizziness needs to be reevaluated.  We need to check his BMET at that  visit as he was started on lisinopril at discharge.  PROCEDURES PERFORMED:  CT head on January 24, 2011 which showed atrophy and chronic microvascular ischemia.  No acute abnormality.  CONSULTATION:  Neurology for code stroke at the time of presentation.  BRIEF ADMITTING HISTORY AND PHYSICAL:  Mr. Bonn is a 65 year old man with a past medical history significant for right MCA stroke in September 2012 for which he received tPA, CAD, aortic aneurysm repair, hypertension, who presents with lightheadedness for 6  hours.  He states  that at approximately 11:00 a.m. 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  He needed to walk up and down a steep flight of stairs  several times.  He also noted it was hot inside.  The last time he  started walking up the stairs he began to feel dizzy, and "groggily like". He describes being lightheaded, flushed, diaphoretic and nauseated and feeling that he was going to fall over.  His heart was racing fast and  he felt like he needed to go outside and sit down.  The cool air made him feel better.  He denids chest pain, but endorsed shortness of breath and headaches in his left frontotemporal region and extending down to his  left neck that began just after his episode of dizziness.  He denies  slurring of speech, facial droop, focal weakness, double or blurry vision,  paresthesias or numbness, though he states that he has some residual  left-sided numbness and weakness from his stroke in September.  PHYSICAL EXAMINATION: VITALS:  Temperature 98, pulse 55, blood pressure 160/86,  respiratory  rate 15, O2 sats 98% on room air. GENERAL:  Obese male in no acute distress, alert, cooperative to exam. HEENT:  Normocephalic, atraumatic, PERRLA, extraocular movements intact. NECK:  No JVD or carotid bruits. LUNGS:  Clear to auscultation bilaterally without rhonchi, wheezes, or rales. HEART:  Bradycardic with regular rhythm, S1, S2 normal without murmurs, rubs, or gallops. ABDOMEN:  Soft, nontender, active bowel sounds.  No masses or organomegaly. EXTREMITIES:  No pretibial edema. NEUROLOGIC:  Alert and oriented x 3, cranial nerves II-XII intact.  Motor strength 5/5 bilaterally in all 4 extremities, sensation intact to light touch, 2+ reflexes bilaterally.  LABS ON ADMISSION:  Sodium 141, potassium 4.3, chloride 104, glucose 102, BUN 24, creatinine 1.5.  Point-of-care troponin negative.     White blood cell count 4.1, hemoglobin 14.1, hematocrit 41.9, platelets 123.  HOSPITAL COURSE BY PROBLEM: 1. Dizziness.  Mr. Gatling presented with symptoms of dizziness and     because of generalized weakness a code stroke was called.  He did not      have focal neurological deficts on exam.  He was not a tPA candidate      because he received tPA one month ago.  A decision not to pursue any      further neurologic work up was made in the absence of any neurologic      findings.  His symptoms of palpitations, feeling flushed or hot and      diaphoretic sounded more like a  vasovagal syncopal event rather than      an acute CVA.  He was monitored in the hospital for 48 hours      and he did not have any recurrence of his symptoms.  He was discharged      home on his home medication of Plavix.  2. Hypertension: Mr. Evilsizer blood pressure was slightly elevated when he     presented and it continued to be mildly elevated during his     hospital stay.  He was therefore started on lisinopril.  3. Asymptomatic Bradycardia: Mr. Sedano pulse was in the 50's during his     entire hospital stay.  He was monitored on telemetry and had no pauses    over a period of 48 hours.  4. Headache.  Mr. Coppolino presented with complaints of a headache which got      better during his hospital stay.  DISCHARGE VITALS:  Temperature 97.8, pulse 55, respiratory rate 18, blood  pressure 139/74, O2 sats 92% on room air.  DISCHARGE LABS:  White blood cell count 4.8, hemoglobin 14.2, hematocrit  42, platelets 125.  Sodium 140, potassium 4.7, chloride 103, bicarb 26,  glucose 104, BUN 18, creatinine 1.31.   ______________________________ Elyse Jarvis, MD   ______________________________ Doneen Poisson, MD   MS/MEDQ  D:  01/26/2011  T:  01/26/2011  Job:  [id number]  cc:   Margorie John, MD  Electronically Signed by Elyse Jarvis MD on 01/29/2011 07:10:36 PM Electronically Signed by Doneen Poisson  on  02/04/2011 10:38:18 PM

## 2011-02-06 ENCOUNTER — Ambulatory Visit (INDEPENDENT_AMBULATORY_CARE_PROVIDER_SITE_OTHER): Payer: Medicare Other | Admitting: Ophthalmology

## 2011-02-06 ENCOUNTER — Encounter: Payer: Self-pay | Admitting: Ophthalmology

## 2011-02-06 VITALS — BP 119/79 | HR 64 | Temp 96.4°F | Ht 71.0 in | Wt 258.0 lb

## 2011-02-06 DIAGNOSIS — IMO0001 Reserved for inherently not codable concepts without codable children: Secondary | ICD-10-CM

## 2011-02-06 DIAGNOSIS — I10 Essential (primary) hypertension: Secondary | ICD-10-CM

## 2011-02-06 DIAGNOSIS — R252 Cramp and spasm: Secondary | ICD-10-CM

## 2011-02-06 DIAGNOSIS — N529 Male erectile dysfunction, unspecified: Secondary | ICD-10-CM

## 2011-02-06 LAB — BASIC METABOLIC PANEL
CO2: 26 mEq/L (ref 19–32)
Chloride: 103 mEq/L (ref 96–112)
Potassium: 4.4 mEq/L (ref 3.5–5.3)
Sodium: 140 mEq/L (ref 135–145)

## 2011-02-06 MED ORDER — VARDENAFIL HCL 10 MG PO TABS: 10.0000 mg | ORAL_TABLET | Freq: Every day | ORAL | Status: AC | PRN

## 2011-02-06 MED ORDER — SIMVASTATIN 10 MG PO TABS: 10.0000 mg | ORAL_TABLET | Freq: Every day | ORAL | Status: DC

## 2011-02-06 NOTE — Assessment & Plan Note (Signed)
Patient reports taking viagra with good results and no side effects in the past. Prescribed levitra today since it is on medicare formulary. Asked patient to call office if it ends up being more expensive and we can switch it to viagra.

## 2011-02-06 NOTE — Progress Notes (Signed)
Subjective:   Patient ID: Dylan Morgan male   DOB: July 27, 1945 65 y.o.   MRN: [id number]  HPI: [name] is a 65 y.o. for hospital follow up. Will check BMET for sodium or potassium. Denies any weakness or tingliness. If he sits in chair for more than an hour has numbness in back. 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 He has to move to keep from having back symptoms. Not smoking still.  Has had vision loss since accident that caused retinal problems in left eye centrally. No weakness. No memory problems.  Has taken potassium pills in the past and didn't help with muscle cramps. Will check magnesium levels. Had ankle brachial index done at end of 2010 which was within normal limits.  Interested in viagra today. Has taken in the past with no problems.  Past Medical History  Diagnosis Date  . CAD (coronary artery disease) CABG in 2004    F/U by Dr Genia Harold cardiology, cardiolite scan done elsewhere in October 2010 that was normal, no ischemia seen, EF 55-65% in 02/2005  . GERD (gastroesophageal reflux disease)   . Hypertension   . Hyperlipidemia   . Renal insufficiency   . Cellulitis and abscess of leg     right leg  . Tobacco abuse   . Chronic back pain   . Chronic neck pain   . Seborrheic dermatitis of scalp   . Abnormal LFTs     Hx of abnormal LFT's  . Bradycardia   . S/P AAA (abdominal aortic aneurysm) repair     November 2010  . Atrial fibrillation 02/22/2009    diltiazem po started but discontinued due to side effects.   Current Outpatient Prescriptions  Medication Sig Dispense Refill  . albuterol (PROVENTIL,VENTOLIN) 90 MCG/ACT inhaler Inhale 2 puffs into the lungs every 4 (four) hours as needed. For wheezing and shortness of breath       . aspirin 81 MG tablet Take 81 mg by mouth daily.        . hydrOXYzine (VISTARIL) 25 MG capsule Take 25 mg by mouth 3 (three) times daily as needed.        . Multiple Vitamins-Minerals  (MENS MULTIVITAMIN PLUS) TABS Take 1 tablet by mouth daily.        Marland Kitchen nystatin (MYCOSTATIN) cream Apply topically 2 (two) times daily. Apply to affected area (groin) twice daily       . simvastatin (ZOCOR) 10 MG tablet Take 10 mg by mouth at bedtime.        . vitamin E 1000 UNIT capsule Take 1,000 Units by mouth daily.         Family History  Problem Relation Age of Onset  . Diabetes Mother   . Stroke Father   . Cancer Sister     thyroid cancer  . Heart disease Brother     Coronary artery disease   History   Social History  . Marital Status: Single    Spouse Name: N/A    Number of Children: N/A  . Years of Education: N/A   Occupational History  . disabled    Social History Main Topics  . Smoking status: Former Smoker    Quit date: 02/05/2009  . Smokeless tobacco: None  . Alcohol Use: No  . Drug Use: No  . Sexually Active: None   Other Topics Concern  . None   Social History Narrative   Lives with son and  his wife.Divorced.Regular exercise.   Review of Systems: Genitourinary: Reports difficulty with maintaining an erection. No problems with sexual interest. Musculoskeletal: Reports myalgias, back pain Skin: Denies wound.  Neurological: Denies dizziness, seizures, syncope, weakness, light-headedness, numbness, tingling and headaches.   Objective:  Physical Exam: Filed Vitals:   02/06/11 1536  BP: 119/79  Pulse: 64  Temp: 96.4 F (35.8 C)  TempSrc: Oral  Height: 5\' 11"  (1.803 m)  Weight: 258 lb (117.028 kg)   Constitutional: Vital signs reviewed.  Patient is a well-developed and well-nourished man in no acute distress and cooperative with exam. Alert and oriented x3.  Eyes: PERRL, EOMI, conjunctivae normal, No scleral icterus.  Neck: Supple, Trachea midline normal ROM, No JVD, mass, thyromegaly, or carotid bruit present.  Cardiovascular: RRR, S1 normal, S2 normal, no MRG, pulses symmetric and intact bilaterally Pulmonary/Chest: CTAB, no wheezes, rales, or  rhonchi Abdominal: Soft. Non-tender, non-distended, bowel sounds are normal. Neurological: A &O x3, cranial nerve II-XII are grossly intact Skin: Warm, dry and intact. No rash, cyanosis, or clubbing.  Psychiatric: Normal mood and affect. speech and behavior is normal. Judgment and thought content normal. Cognition and memory are normal.   Assessment & Plan:

## 2011-02-06 NOTE — Patient Instructions (Signed)
Stroke Prevention Some medical conditions and some behaviors are associated with an increased chance of having a stroke. You may prevent a stroke by making healthy choices and managing medical conditions. You can reduce your risk of having a stroke by:  Staying physically active. It is recommended that you get at least 30 minutes of activity on most or all days.   Not smoking.   Limiting alcohol use. Moderate alcohol use is considered to be:   No more than 2 drinks per day for men.   No more than 1 drink per day for nonpregnant women.   Eating healthy foods. Certain diets may be prescribed to address high blood pressure, high cholesterol, diabetes, or obesity. A diet that includes 5 or more servings of fruits and vegetables a day may reduce the risk of stroke.   Managing your cholesterol levels. A low saturated fat, low trans fat, low cholesterol, and high fiber diet may control cholesterol levels. Take any prescribed medications to control cholesterol as directed by your caregiver.   Managing your diabetes. A controlled carbohydrate, controlled sugar diet is recommended to manage diabetes. Take any prescribed medications to control diabetes as directed by your caregiver.   Controlling your high blood pressure (hypertension). A low salt (sodium), low saturated fat, low trans fat, and low cholesterol diet is recommended to manage high blood pressure. Take any prescribed medications to control hypertension as directed by your caregiver.   Maintaining a healthy weight. A reduced calorie, low sodium, low saturated fat, low trans fat, low cholesterol diet is recommended to manage weight.   Stopping drug abuse.   Taking medications as directed by your caregiver. For some individuals, aspirin or "blood thinners" (anticoagulants) are helpful in reducing the risk of forming abnormal blood clots that can lead to stroke. If you have the irregular heart rhythm of atrial fibrillation, you should be on a  blood thinner unless there is a good reason you cannot take them. Be sure you understand all your medication instructions.  SEEK IMMEDIATE MEDICAL CARE IF:   You have sudden weakness or numbness of the face, arm, or leg, especially on 1 side of the body.   You have sudden confusion.   You have trouble speaking (aphasia) or understanding.   You have sudden trouble seeing in 1 or both eyes.   You have sudden trouble walking.   You have dizziness.   You have a loss of balance or coordination.   You have a sudden severe headache with no known cause.   You have an oral temperature above 102 F (38.9 C), not controlled by medicine.   You are coughing or have difficulty breathing.   You have new chest pain, angina, or an irregular heartbeat.  ANY OF THESE SYMPTOMS MAY REPRESENT A SERIOUS PROBLEM THAT IS AN EMERGENCY. Do not wait to see if the symptoms will go away. Get medical help right away. Call your local emergency services (911 in [place] DO NOT drive yourself to the hospital. Document Released: 04/27/2004 Document Revised: 10/03/2010 Document Reviewed: 08/20/2009 [facility] Patient Information 2012 [place], [place].

## 2011-02-06 NOTE — Assessment & Plan Note (Signed)
Blood pressure very well controlled today. Patient was started on lisinopril on admission so checked BMET today for any electrolyte disturbances. Patient reports having low potassium in the past but he was normal on this admission.

## 2011-02-06 NOTE — Assessment & Plan Note (Signed)
Patient complains of cramps in his legs. Checking potassium, magnesium, calcium today for any contribution to cramping. He has had ankle brachial index that was within normal limits two years ago so peripheral arterial disease is not likely contributing. Is possible that zocor is contributing, patient unsure of how long he has taken it. Will consider changing to pravastatin on next visit.

## 2011-02-27 ENCOUNTER — Encounter: Payer: Medicare Other | Admitting: Ophthalmology

## 2011-04-07 ENCOUNTER — Ambulatory Visit (INDEPENDENT_AMBULATORY_CARE_PROVIDER_SITE_OTHER): Payer: Medicare Other | Admitting: Ophthalmology

## 2011-04-07 ENCOUNTER — Encounter: Payer: Self-pay | Admitting: Ophthalmology

## 2011-04-07 DIAGNOSIS — N529 Male erectile dysfunction, unspecified: Secondary | ICD-10-CM

## 2011-04-07 DIAGNOSIS — G4762 Sleep related leg cramps: Secondary | ICD-10-CM | POA: Insufficient documentation

## 2011-04-07 MED ORDER — SILDENAFIL CITRATE 100 MG PO TABS: 100.0000 mg | ORAL_TABLET | ORAL | Status: DC | PRN

## 2011-04-07 NOTE — Progress Notes (Signed)
Subjective:   Patient ID: Dylan Morgan male   DOB: 1945-06-26 66 y.o.   MRN: [id number]  HPI: [name] is a 66 y.o. man with CAD, HTN, HLD and renal insufficiency presents for routine follow up with no acute complaints.  Complains of continued leg cramps- early in the morning and hurts more when it is damp or cold out. Feels them more on left side in calf and in hamstring. Is made better by walking. Better with eating bananas but not OTC potassium pills as much. Started simvastatin in November but he has cramps for years. Patient reports that he tosses and turns a lot at night but no one has ever told him that he kicks his legs. Sleeps mostly on right side so no reason for increased pressure on left side. Eats red meat so not likely iron deficient. Thinks that cramps are related to days that he has more restless sleep and days with more physical activity.  Levitra was $90 for 6 pills, Viagra is $10 a pill so it was not actually less expensive.  Past Medical History  Diagnosis Date  . CAD (coronary artery disease) CABG in 2004    F/U by Dr Dylan Morgan cardiology, cardiolite scan done elsewhere in October 2010 that was normal, no ischemia seen, EF 55-65% in 02/2005  . GERD (gastroesophageal reflux disease)   . Hypertension   . Hyperlipidemia   . Renal insufficiency   . Cellulitis and abscess of leg     right leg  . Tobacco abuse   . Chronic back pain   . Chronic neck pain   . Seborrheic dermatitis of scalp   . Abnormal LFTs     Hx of abnormal LFT's  . Bradycardia   . S/P AAA (abdominal aortic aneurysm) repair     November 2010  . Campath-induced atrial fibrillation 02/22/2009    diltiazem po started but discontinued due to side effects.   Current Outpatient Prescriptions  Medication Sig Dispense Refill  . albuterol (PROVENTIL,VENTOLIN) 90 MCG/ACT inhaler Inhale 2 puffs into the lungs every 4 (four) hours as needed. For wheezing and shortness of breath       . aspirin 81  MG tablet Take 81 mg by mouth daily.        . clopidogrel (PLAVIX) 75 MG tablet Take 75 mg by mouth daily.        . hydrOXYzine (VISTARIL) 25 MG capsule Take 25 mg by mouth 3 (three) times daily as needed.        Marland Kitchen lisinopril (PRINIVIL,ZESTRIL) 10 MG tablet Take 10 mg by mouth daily.        . Multiple Vitamins-Minerals (MENS MULTIVITAMIN PLUS) TABS Take 1 tablet by mouth daily.        Marland Kitchen nystatin (MYCOSTATIN) cream Apply topically 2 (two) times daily. Apply to affected area (groin) twice daily       . simvastatin (ZOCOR) 10 MG tablet Take 1 tablet (10 mg total) by mouth at bedtime.  90 tablet  3  . vitamin E 1000 UNIT capsule Take 1,000 Units by mouth daily.         Family History  Problem Relation Age of Onset  . Diabetes Mother   . Stroke Father   . Cancer Sister     thyroid cancer  . Heart disease Brother     Coronary artery disease   History   Social History  . Marital Status: Single    Spouse Name: N/A  Number of Children: N/A  . Years of Education: N/A   Occupational History  . disabled    Social History Main Topics  . Smoking status: Former Smoker    Quit date: 02/05/2009  . Smokeless tobacco: None  . Alcohol Use: No  . Drug Use: No  . Sexually Active: None   Other Topics Concern  . None   Social History Narrative   Lives with son and his wife.Divorced.Regular exercise.   Objective:  Physical Exam: Filed Vitals:   04/07/11 1500  BP: 131/81  Pulse: 77  Temp: 96.8 F (36 C)  TempSrc: Oral  Height: 5\' 11"  (1.803 m)  Weight: 255 lb 1.6 oz (115.713 kg)  General: friendly elderly male in no distress HEENT: PERRL, EOMI, no scleral icterus Cardiac: RRR, no rubs, murmurs or gallops Pulm: clear to auscultation bilaterally, moving normal volumes of air Abd: soft, nontender, nondistended, BS present Ext: warm and well perfused, no pedal edema Neuro: alert and oriented X3, cranial nerves II-XII grossly intact   Assessment & Plan:

## 2011-04-07 NOTE — Patient Instructions (Signed)
Make sure you stay hydrated and eat food with potassium, oranges, bananas, avocados.

## 2011-04-07 NOTE — Assessment & Plan Note (Signed)
Prescribed viagra since patient said levitra was not less expensive.

## 2011-04-07 NOTE — Assessment & Plan Note (Signed)
Patient does not have low potassium or magnesium. Is likely to have low iron since Hgb is good and eats red meat regularly. Advised him to drink more water and to eat fruit with potassium such a avocados, oranges, bananas. Will avoid supplements since his creatinine is slightly elevated. Can check creatinine at next appt to see if it has decreased from inpatient values. Advised him to do daily calf and hamstring stretches.

## 2011-04-09 NOTE — Progress Notes (Signed)
I agree with Dr. Maisie Fus' assessment and plan.

## 2011-04-13 ENCOUNTER — Other Ambulatory Visit: Payer: Self-pay | Admitting: Internal Medicine

## 2011-05-26 ENCOUNTER — Other Ambulatory Visit: Payer: Self-pay

## 2011-05-26 ENCOUNTER — Ambulatory Visit (INDEPENDENT_AMBULATORY_CARE_PROVIDER_SITE_OTHER): Payer: Medicare Other | Admitting: Internal Medicine

## 2011-05-26 ENCOUNTER — Encounter: Payer: Self-pay | Admitting: Internal Medicine

## 2011-05-26 ENCOUNTER — Ambulatory Visit (HOSPITAL_COMMUNITY)
Admission: RE | Admit: 2011-05-26 | Discharge: 2011-05-26 | Disposition: A | Discharge status: Home / Self Care | Payer: Medicare Other | Source: Ambulatory Visit | Attending: Ophthalmology | Admitting: Ophthalmology

## 2011-05-26 VITALS — BP 130/70 | HR 51 | Temp 96.6°F | Ht 71.0 in | Wt 257.0 lb

## 2011-05-26 DIAGNOSIS — R9431 Abnormal electrocardiogram [ECG] [EKG]: Secondary | ICD-10-CM | POA: Insufficient documentation

## 2011-05-26 DIAGNOSIS — I4891 Unspecified atrial fibrillation: Secondary | ICD-10-CM

## 2011-05-26 DIAGNOSIS — I10 Essential (primary) hypertension: Secondary | ICD-10-CM

## 2011-05-26 DIAGNOSIS — R42 Dizziness and giddiness: Secondary | ICD-10-CM | POA: Insufficient documentation

## 2011-05-26 LAB — CBC
HCT: 44.9 % (ref 39.0–52.0)
Hemoglobin: 14.8 g/dL (ref 13.0–17.0)
MCH: 30.6 pg (ref 26.0–34.0)
MCV: 92.8 fL (ref 78.0–100.0)
Platelets: 149 10*3/uL — ABNORMAL LOW (ref 150–400)
RBC: 4.84 MIL/uL (ref 4.22–5.81)
WBC: 5.8 10*3/uL (ref 4.0–10.5)

## 2011-05-26 MED ORDER — LISINOPRIL 10 MG PO TABS: 10.0000 mg | ORAL_TABLET | Freq: Every day | ORAL | Status: DC

## 2011-05-26 MED ORDER — SIMVASTATIN 10 MG PO TABS: 10.0000 mg | ORAL_TABLET | Freq: Every day | ORAL | Status: DC

## 2011-05-26 MED ORDER — CLOPIDOGREL BISULFATE 75 MG PO TABS: 75.0000 mg | ORAL_TABLET | Freq: Every day | ORAL | Status: DC

## 2011-05-26 NOTE — Patient Instructions (Signed)
Call the clinic or return to the Ed if your dizziness/staggering gets worse Make an appointment with your cardiologist

## 2011-05-26 NOTE — Assessment & Plan Note (Signed)
Blood pressure 130/ 70 without orthostasis Continue lisinopril at 10 mg qd

## 2011-05-26 NOTE — Assessment & Plan Note (Signed)
Patient had an isolated atrial fibrillation event in 2010. He was started on diltiazem by Dr. Myrtis Ser but he became bradycardic and converted to normal sinus rhythm. He was feeling sick with this medicine and it was stopped. He was never found to be in atrial fibrillation again was not put back on any rate controlling medicines or anticoagulation. He had a stroke in 2012 and getting his hospitalization, his telemetry reading did not show any atrial fibrillation. In fact looking back at the EKG from that hospitalization, he has been in sinus bradycardia at a rate of 50-60 without any rate controlling medicines. He denies any complaints of palpitations or chest discomfort. EKG in the clinic today shows sinus bradycardia at a rate of 54 per minute continue to monitor the patient. Ask him to schedule a followup appointment with Dr. Myrtis Ser to evaluate for need for a Holter monitor

## 2011-05-26 NOTE — Progress Notes (Signed)
Patient ID: Dylan Morgan, male   DOB: [birth year], 66 y.o.   MRN: [id number]  66 year old man with past medical history of stroke in October 2012, hypertension, hyperlipidemia comes to the clinic with dizziness  See assessment and plan for details   Physical exam  General Appearance:     Filed Vitals:   05/26/11 1021 05/26/11 1027  BP: 149/88 153/93  Pulse: 51 51  Temp: 96.6 F (35.9 C)   TempSrc: Oral   Height: 5\' 11"  (1.803 m)   Weight: 257 lb (116.574 kg)   SpO2: 97%      Alert, cooperative, no distress, appears stated age  Head:    Normocephalic, without obvious abnormality, atraumatic  Eyes:    PERRL, conjunctiva/corneas clear, EOM's intact, fundi    benign, both eyes       Neck:   Supple, symmetrical, trachea midline, no adenopathy;       thyroid:  No enlargement/tenderness/nodules; no carotid   bruit or JVD  Lungs:     Clear to auscultation bilaterally, respirations unlabored  Chest wall:    No tenderness or deformity  Heart:    Regular rate and rhythm, S1 and S2 normal, no murmur, rub   or gallop  Abdomen:     Soft, non-tender, bowel sounds active all four quadrants,    no masses, no organomegaly  Extremities:   Extremities normal, atraumatic, no cyanosis or edema  Pulses:   2+ and symmetric all extremities  Skin:   Skin color, texture, turgor normal, no rashes or lesions  Neurologic:  nonfocal grossly   ROS Constitutional: Denies fever, chills, diaphoresis, appetite change and fatigue.  Respiratory: Denies SOB, DOE, cough, chest tightness,  and wheezing.   Cardiovascular: Denies chest pain, palpitations and leg swelling.  Gastrointestinal: Denies nausea, vomiting, abdominal pain, diarrhea, constipation, blood in stool and abdominal distention.  Skin: Denies pallor, rash and wound.  Neurological: Denies  numbness and headaches.

## 2011-05-26 NOTE — Assessment & Plan Note (Signed)
Patient is complaining of feeling of staggering early in the morning when he gets up. This lasts for few seconds and makes him hold onto something to prevent a fall. After that he does not have any complaints throughout the day even if he is lying down or sitting down and gets up later in the day. He feels like the room is spinning around him and his occurs. He has not noticed any tingling or numbness in his arms or legs or any focal weakness in his arms or legs. He denies any speech, vision or hearing impairment. This has been going on for past 3 days. He has not been on Plavix for past 2 weeks because he ran out of it and did not call in for refill. He thinks because he stopped Plavix this is going through the symptoms. He is not experiencing any of the symptoms he did the last time he had stroke. His neurological exam is completely normal.  Plan -Given negative neurological exam and symptoms only early in the morning without any worsening course for past 3 days, my suspicion for a big ischemic stroke is low even though there is a possibility given his not taking his Plavix for past 3 weeks. I do not feel a strong need for a CT or an MRI of his head at this time. I will restart his Plavix and asked him to followup in the clinic or come to the emergency department if his symptoms persist or get worse in next week -He is bradycardic in the clinic and has been bradycardic during the hospitalization in October. His symptoms may be from bradycardia but again because it occurs only once in the morning this is unlikely. As mentioned earlier I did ask him to follow up with his cardiologist for possibility of 24-hour monitor to see if it's slipping into atrial fibrillation during the day or if his heart rate is falling less than 50s -He is not orthostatic in the clinic and his blood pressure is good - He does not have any hearing or vision changes or any need for ENT evaluation at this time

## 2011-06-16 ENCOUNTER — Telehealth: Payer: Self-pay | Admitting: Cardiology

## 2011-06-23 ENCOUNTER — Encounter: Payer: Self-pay | Admitting: Physician Assistant

## 2011-06-23 ENCOUNTER — Ambulatory Visit (INDEPENDENT_AMBULATORY_CARE_PROVIDER_SITE_OTHER): Payer: Medicare Other | Admitting: Physician Assistant

## 2011-06-23 VITALS — BP 102/60 | HR 74 | Ht 71.0 in | Wt 249.0 lb

## 2011-06-23 DIAGNOSIS — I2510 Atherosclerotic heart disease of native coronary artery without angina pectoris: Secondary | ICD-10-CM

## 2011-06-23 DIAGNOSIS — R42 Dizziness and giddiness: Secondary | ICD-10-CM

## 2011-06-23 DIAGNOSIS — I498 Other specified cardiac arrhythmias: Secondary | ICD-10-CM

## 2011-06-23 DIAGNOSIS — R001 Bradycardia, unspecified: Secondary | ICD-10-CM

## 2011-06-23 DIAGNOSIS — I10 Essential (primary) hypertension: Secondary | ICD-10-CM

## 2011-06-23 DIAGNOSIS — I4891 Unspecified atrial fibrillation: Secondary | ICD-10-CM

## 2011-06-23 NOTE — Progress Notes (Signed)
[address] [place], [place]  [address] Phone: [phone] Fax:  [phone]  Date:  06/23/2011   Name:  Dylan Morgan       DOB:  26-Feb-1946 MRN:  [id number]  PCP:  Dr. Margorie Morgan Primary Cardiologist:  Dr. Zackery Morgan  Primary Electrophysiologist:  None    History of Present Illness: Dylan Morgan is a 66 y.o. male who presents for follow up.  He has a history of CAD, status post CABG in 2004, AAA, status post endovascular stenting 02/2009, paroxysmal atrial fibrillation, prior stroke in 01/2011 (right perirolandic subcortical) treated with tPA, renal insufficiency.  He has a known occluded left internal carotid artery.  Dopplers 10/12: LICA occlusion, RCA okay.  Last nuclear study 01/2009: No ischemia, EF 71%.   Last echo 12/2010: Moderate LVH, EF 60-65%.  He was last seen by Dr. Myrtis Morgan 03/2009.  Prior to that visit, he had been noted to be in atrial fibrillation for the first time.  He was placed on diltiazem.  He converted back to sinus rhythm.  He felt poorly with this and was bradycardic and his diltiazem was stopped.  At that time, his problem but was factor profile is felt to be low and Coumadin was not initiated.  He saw his PCP recently with complaints of dizziness.  He was noted be bradycardic and he was referred back to cardiology for further evaluation.  He denies chest pain.  He has dyspnea with more extreme activities.  No syncope.  Notes occasional dizziness.  Feels lightheaded.  No spinning.  Feels like he loses his balance to the right.  Has felt like this since his stroke.  No orthopnea, PND or edema.    Past Medical History  Diagnosis Date  . CAD (coronary artery disease) CABG in 2004    F/U by Dr Dylan Morgan cardiology, cardiolite scan done elsewhere in October 2010 that was normal, no ischemia seen, EF 55-65% in 02/2005  . GERD (gastroesophageal reflux disease)   . Hypertension   . Hyperlipidemia   . Renal insufficiency   .  Cellulitis and abscess of leg     right leg  . Tobacco abuse   . Chronic back pain   . Chronic neck pain   . Seborrheic dermatitis of scalp   . Abnormal LFTs     Hx of abnormal LFT's  . Bradycardia   . S/P AAA (abdominal aortic aneurysm) repair     November 2010  . Atrial fibrillation 02/22/2009    diltiazem po started but discontinued due to side effects.    Current Outpatient Prescriptions  Medication Sig Dispense Refill  . clopidogrel (PLAVIX) 75 MG tablet Take 1 tablet (75 mg total) by mouth daily.  90 tablet  4  . simvastatin (ZOCOR) 10 MG tablet Take 1 tablet (10 mg total) by mouth at bedtime.  90 tablet  4  . vitamin E 1000 UNIT capsule Take 1,000 Units by mouth daily.        Marland Kitchen lisinopril (PRINIVIL,ZESTRIL) 10 MG tablet Take 10 mg by mouth daily. ( NOT TAKING )        Allergies: Allergies  Allergen Reactions  . Diltiazem Hcl   . Oxycodone-Acetaminophen     History  Substance Use Topics  . Smoking status: Former Smoker    Quit date: 02/05/2009  . Smokeless tobacco: Not on file  . Alcohol Use: No     ROS:  Please see the history of present illness.  All other systems reviewed and negative.   PHYSICAL EXAM: VS:  BP 102/60  Pulse 74  Ht 5\' 11"  (1.803 m)  Wt 249 lb (112.946 kg)  BMI 34.73 kg/m2 Well nourished, well developed, in no acute distress HEENT: normal Neck: no JVD Cardiac:  normal S1, S2; RRR; no murmur Lungs:  clear to auscultation bilaterally, no wheezing, rhonchi or rales Abd: soft, nontender, no hepatomegaly Ext: no edema Skin: warm and dry Neuro:  CNs 2-12 intact, no focal abnormalities noted  EKG:  Sinus rhythm, heart rate 74, left axis deviation, inferior Q waves, PACs  ASSESSMENT AND PLAN:  1. Bradycardia  I reviewed his ECG from his PCPs office.  This demonstrated sinus bradycardia.  His heart rate is much better today.  Echocardiogram several months ago was normal.  I will arrange a 30 day event monitor.  Followup in 4-6 weeks with  Dr. Myrtis Morgan.    2. Paroxysmal Atrial fibrillation  This was noted once several years ago.  I do not think there has been no documented recurrence since that time.  His CHADS2 score is now 3 with his h/o stroke.  As noted, I will place him on an event monitor.  If this demonstrates recurrent atrial fibrillation, he will need anticoagulation therapy.  Followup with Dr. Myrtis Morgan in 4-6 weeks.   3. CORONARY ARTERY DISEASE  No angina.  Continue Plavix.   4. HYPERTENSION  Controlled.  Continue current therapy.  He has medications in different bottles.  I spent > 10 minutes identifying his medications today and counseled him on keeping medications in their correct bottles.   5. Dizziness - light-headed  Obtain event monitor.  Follow up as noted and continue follow up with PCP.     Signed, Tereso Newcomer, PA-C  1:30 PM 06/23/2011

## 2011-06-23 NOTE — Patient Instructions (Signed)
Your physician has recommended that you wear an event monitor DX 427.31. Event monitors are medical devices that record the heart's electrical activity. Doctors most often [place] these monitors to diagnose arrhythmias. Arrhythmias are problems with the speed or rhythm of the heartbeat. The monitor is a small, portable device. You can wear one while you do your normal daily activities. This is usually used to diagnose what is causing palpitations/syncope (passing out).  Your physician recommends that you schedule a follow-up appointment in: 4-6 WEEKS WITH DR. KATZ

## 2011-07-05 ENCOUNTER — Encounter (INDEPENDENT_AMBULATORY_CARE_PROVIDER_SITE_OTHER): Payer: Medicare Other

## 2011-07-05 ENCOUNTER — Telehealth: Payer: Self-pay | Admitting: Cardiology

## 2011-07-05 ENCOUNTER — Telehealth: Payer: Self-pay | Admitting: Physician Assistant

## 2011-07-05 DIAGNOSIS — R42 Dizziness and giddiness: Secondary | ICD-10-CM

## 2011-07-05 DIAGNOSIS — I4891 Unspecified atrial fibrillation: Secondary | ICD-10-CM

## 2011-07-05 DIAGNOSIS — R001 Bradycardia, unspecified: Secondary | ICD-10-CM

## 2011-07-05 NOTE — Telephone Encounter (Signed)
Notified [facility] that fax was received and given to [facility], PA-C.

## 2011-07-05 NOTE — Telephone Encounter (Signed)
Tried calling again and left another message

## 2011-07-05 NOTE — Telephone Encounter (Signed)
Please return call call to [facility]  [phone]   Patient had an abnormal EKG reading this morning, Please return call to [facility]  [phone].

## 2011-07-05 NOTE — Telephone Encounter (Signed)
Left message with patient concerning his monitor. Scott wants him seen tomorrow if possible at 12:10, to discuss monitor and possible coumadin.

## 2011-07-06 ENCOUNTER — Ambulatory Visit (INDEPENDENT_AMBULATORY_CARE_PROVIDER_SITE_OTHER): Payer: Medicare Other | Admitting: Physician Assistant

## 2011-07-06 ENCOUNTER — Encounter: Payer: Self-pay | Admitting: Physician Assistant

## 2011-07-06 VITALS — BP 132/80 | HR 53 | Ht 71.0 in | Wt 250.0 lb

## 2011-07-06 DIAGNOSIS — R0609 Other forms of dyspnea: Secondary | ICD-10-CM

## 2011-07-06 DIAGNOSIS — I4891 Unspecified atrial fibrillation: Secondary | ICD-10-CM

## 2011-07-06 DIAGNOSIS — R0683 Snoring: Secondary | ICD-10-CM

## 2011-07-06 DIAGNOSIS — R0989 Other specified symptoms and signs involving the circulatory and respiratory systems: Secondary | ICD-10-CM

## 2011-07-06 MED ORDER — WARFARIN SODIUM 5 MG PO TABS: 5.0000 mg | ORAL_TABLET | ORAL | Status: DC

## 2011-07-06 NOTE — Patient Instructions (Signed)
Your physician recommends that you schedule a follow-up appointment as scheduled with Dr Myrtis Ser Your physician recommends that you schedule a follow-up appointment in 5-7 days with the coumadin clinic Your physician has recommended that you have a sleep study. This test records several body functions during sleep, including: brain activity, eye movement, oxygen and carbon dioxide blood levels, heart rate and rhythm, breathing rate and rhythm, the flow of air through your mouth and nose, snoring, body muscle movements, and chest and belly movement. Your physician has recommended you make the following change in your medication: START Warfarin 5 mg daily

## 2011-07-06 NOTE — Telephone Encounter (Signed)
Pt showed up in office for appointment

## 2011-07-06 NOTE — Progress Notes (Signed)
[address] [place], [place]  [address] Phone: [phone] Fax:  [phone]  Date:  07/06/2011   Name:  Dylan Morgan       DOB:  30-Apr-1945 MRN:  [id number]  PCP:  Dr. Margorie John Primary Cardiologist:  Dr. Zackery Barefoot  Primary Electrophysiologist:  None    History of Present Illness: Dylan Morgan is a 66 y.o. male who presents for follow up.  He has a history of CAD, status post CABG in 2004, AAA, status post endovascular stenting 02/2009, paroxysmal atrial fibrillation, prior stroke in 01/2011 (right perirolandic subcortical) treated with tPA, renal insufficiency.  He has a known occluded left internal carotid artery.  Dopplers 10/12: LICA occlusion, RCA okay.  Last nuclear study 01/2009: No ischemia, EF 71%.   Last echo 12/2010: Moderate LVH, EF 60-65%.  He was last seen by Dr. Myrtis Ser 03/2009.  Prior to that visit, he had been noted to be in atrial fibrillation for the first time.  He was placed on diltiazem.  He converted back to sinus rhythm.  He felt poorly with this and was bradycardic and his diltiazem was stopped.  At that time, his CHADS2 score was low and Coumadin was not initiated.  Recently saw his PCP recently with complaints of dizziness and he was bradycardic and he was referred back.  I saw him 3/22. I set him up for an event monitor and we received strips back yesterday that demonstrated AFib vs Flutter with CVR.  I brought him in today to discuss anticoagulation.  He denies a h/o intracranial or intestinal bleeding.  No h/o falling.  The patient denies chest pain, shortness of breath, syncope, orthopnea, PND or significant pedal edema.  He does note a h/o snoring.  He also notes daytime hypersomnolence.    Past Medical History  Diagnosis Date  . CAD (coronary artery disease) CABG in 2004    F/U by Dr Genia Harold cardiology, cardiolite scan done elsewhere in October 2010 that was normal, no ischemia seen, EF 55-65% in 02/2005  . GERD  (gastroesophageal reflux disease)   . Hypertension   . Hyperlipidemia   . Renal insufficiency   . Cellulitis and abscess of leg     right leg  . Tobacco abuse   . Chronic back pain   . Chronic neck pain   . Seborrheic dermatitis of scalp   . Abnormal LFTs     Hx of abnormal LFT's  . Bradycardia   . S/P AAA (abdominal aortic aneurysm) repair     November 2010  . Paroxysmal atrial fibrillation 02/22/2009    diltiazem po started but discontinued due to side effects.  . Stroke     01/2011; Echo 9/12: mod LVH, EF 60-65%    Current Outpatient Prescriptions  Medication Sig Dispense Refill  . clopidogrel (PLAVIX) 75 MG tablet Take 1 tablet (75 mg total) by mouth daily.  90 tablet  4  . lisinopril (PRINIVIL,ZESTRIL) 10 MG tablet Take 10 mg by mouth daily. ( NOT TAKING )      . simvastatin (ZOCOR) 10 MG tablet Take 1 tablet (10 mg total) by mouth at bedtime.  90 tablet  4  . vitamin E 1000 UNIT capsule Take 1,000 Units by mouth daily.        Marland Kitchen DISCONTD: sildenafil (VIAGRA) 100 MG tablet Take 1 tablet (100 mg total) by mouth as needed for erectile dysfunction.  20 tablet  1    Allergies: Allergies  Allergen  Reactions  . Diltiazem Hcl   . Oxycodone-Acetaminophen     History  Substance Use Topics  . Smoking status: Former Smoker    Quit date: 02/05/2009  . Smokeless tobacco: Not on file  . Alcohol Use: No     ROS:  Please see the history of present illness.   All other systems reviewed and negative.   PHYSICAL EXAM: VS:  BP 132/80  Pulse 53  Ht 5\' 11"  (1.803 m)  Wt 250 lb (113.399 kg)  BMI 34.87 kg/m2 Well nourished, well developed, in no acute distress HEENT: normal Neck: no JVD Cardiac:  normal S1, S2; RRR; no murmur Lungs:  clear to auscultation bilaterally, no wheezing, rhonchi or rales Abd: soft, nontender, no hepatomegaly Ext: no edema Skin: warm and dry Neuro:  CNs 2-12 intact, no focal abnormalities noted  EKG:  Sinus brady, HR 53  ASSESSMENT AND  PLAN:  1.  Paroxysmal Atrial Fibrillation CHADS2 score is 3.  He is high risk for recurrent stroke.  I suspect his CVA last year was embolic.  I had a long d/w the patient regarding the risks and benefits of coumadin.  Start coumadin 5 mg QD.  Schedule coumadin clinic visit in 5-7 days.  He can stop Plavix after 5 days of coumadin.  Continue wearing monitor.  Follow up with Dr. Zackery Barefoot as scheduled.    2.  CAD No angina.  He can stop Plavix with initiation of coumadin.    3.  HTN Controlled.  Continue current therapy.   4.  Prior Stroke Start coumadin as noted.  5.  Snoring I suspect he has OSA.  Will schedule split night sleep test.    Signed, Tereso Newcomer, PA-C  11:57 AM 07/06/2011

## 2011-07-12 ENCOUNTER — Ambulatory Visit (INDEPENDENT_AMBULATORY_CARE_PROVIDER_SITE_OTHER): Payer: Medicare Other | Admitting: Pharmacist

## 2011-07-12 DIAGNOSIS — I4891 Unspecified atrial fibrillation: Secondary | ICD-10-CM

## 2011-07-12 DIAGNOSIS — Z7901 Long term (current) use of anticoagulants: Secondary | ICD-10-CM

## 2011-07-12 LAB — POCT INR: INR: 1.6

## 2011-07-21 ENCOUNTER — Encounter: Payer: Medicare Other | Admitting: Ophthalmology

## 2011-07-24 ENCOUNTER — Ambulatory Visit (HOSPITAL_BASED_OUTPATIENT_CLINIC_OR_DEPARTMENT_OTHER): Payer: Medicare Other | Attending: Physician Assistant | Admitting: Radiology

## 2011-07-24 ENCOUNTER — Ambulatory Visit (INDEPENDENT_AMBULATORY_CARE_PROVIDER_SITE_OTHER): Payer: Medicare Other | Admitting: *Deleted

## 2011-07-24 VITALS — Ht 71.0 in | Wt 250.0 lb

## 2011-07-24 DIAGNOSIS — R0683 Snoring: Secondary | ICD-10-CM

## 2011-07-24 DIAGNOSIS — Z7901 Long term (current) use of anticoagulants: Secondary | ICD-10-CM

## 2011-07-24 DIAGNOSIS — G4733 Obstructive sleep apnea (adult) (pediatric): Secondary | ICD-10-CM | POA: Insufficient documentation

## 2011-07-24 DIAGNOSIS — I4891 Unspecified atrial fibrillation: Secondary | ICD-10-CM

## 2011-07-24 LAB — POCT INR: INR: 4.9

## 2011-08-01 ENCOUNTER — Encounter: Payer: Self-pay | Admitting: Cardiology

## 2011-08-01 NOTE — Progress Notes (Signed)
Event recorder was done from April 3-22, 2013.There is atrial fib seen. The overall atrial fib Azucena Cecil is very low. However there are several brief episodes. There are 3 episodes of less than 1 minute, 2 episodes of one to 5 minutes, and 5 episodes from 5-60 minutes. The longest episode was 24 minutes.  The patient is being coumadinized and I will be seeing him back in the office.  Jerral Bonito, MD

## 2011-08-02 ENCOUNTER — Encounter: Payer: Self-pay | Admitting: Cardiology

## 2011-08-02 DIAGNOSIS — Z951 Presence of aortocoronary bypass graft: Secondary | ICD-10-CM | POA: Insufficient documentation

## 2011-08-02 DIAGNOSIS — Z720 Tobacco use: Secondary | ICD-10-CM | POA: Insufficient documentation

## 2011-08-02 DIAGNOSIS — R001 Bradycardia, unspecified: Secondary | ICD-10-CM | POA: Insufficient documentation

## 2011-08-02 DIAGNOSIS — R945 Abnormal results of liver function studies: Secondary | ICD-10-CM | POA: Insufficient documentation

## 2011-08-02 DIAGNOSIS — IMO0002 Reserved for concepts with insufficient information to code with codable children: Secondary | ICD-10-CM | POA: Insufficient documentation

## 2011-08-02 DIAGNOSIS — E785 Hyperlipidemia, unspecified: Secondary | ICD-10-CM | POA: Insufficient documentation

## 2011-08-02 DIAGNOSIS — I10 Essential (primary) hypertension: Secondary | ICD-10-CM | POA: Insufficient documentation

## 2011-08-02 DIAGNOSIS — I639 Cerebral infarction, unspecified: Secondary | ICD-10-CM | POA: Insufficient documentation

## 2011-08-02 DIAGNOSIS — I714 Abdominal aortic aneurysm, without rupture: Secondary | ICD-10-CM | POA: Insufficient documentation

## 2011-08-02 DIAGNOSIS — R9430 Abnormal result of cardiovascular function study, unspecified: Secondary | ICD-10-CM | POA: Insufficient documentation

## 2011-08-02 DIAGNOSIS — I2510 Atherosclerotic heart disease of native coronary artery without angina pectoris: Secondary | ICD-10-CM | POA: Insufficient documentation

## 2011-08-04 ENCOUNTER — Encounter: Payer: Self-pay | Admitting: Cardiology

## 2011-08-04 ENCOUNTER — Ambulatory Visit (INDEPENDENT_AMBULATORY_CARE_PROVIDER_SITE_OTHER): Payer: Medicare Other | Admitting: Pharmacist

## 2011-08-04 ENCOUNTER — Ambulatory Visit (INDEPENDENT_AMBULATORY_CARE_PROVIDER_SITE_OTHER): Payer: Medicare Other | Admitting: Cardiology

## 2011-08-04 ENCOUNTER — Encounter: Payer: Medicare Other | Admitting: Ophthalmology

## 2011-08-04 VITALS — BP 129/73 | HR 44 | Ht 71.0 in | Wt 248.4 lb

## 2011-08-04 DIAGNOSIS — Z7901 Long term (current) use of anticoagulants: Secondary | ICD-10-CM

## 2011-08-04 DIAGNOSIS — G4733 Obstructive sleep apnea (adult) (pediatric): Secondary | ICD-10-CM

## 2011-08-04 DIAGNOSIS — I4891 Unspecified atrial fibrillation: Secondary | ICD-10-CM

## 2011-08-04 DIAGNOSIS — R0989 Other specified symptoms and signs involving the circulatory and respiratory systems: Secondary | ICD-10-CM

## 2011-08-04 DIAGNOSIS — I639 Cerebral infarction, unspecified: Secondary | ICD-10-CM

## 2011-08-04 DIAGNOSIS — R9430 Abnormal result of cardiovascular function study, unspecified: Secondary | ICD-10-CM

## 2011-08-04 DIAGNOSIS — I498 Other specified cardiac arrhythmias: Secondary | ICD-10-CM

## 2011-08-04 DIAGNOSIS — G4730 Sleep apnea, unspecified: Secondary | ICD-10-CM

## 2011-08-04 DIAGNOSIS — R42 Dizziness and giddiness: Secondary | ICD-10-CM

## 2011-08-04 DIAGNOSIS — I2510 Atherosclerotic heart disease of native coronary artery without angina pectoris: Secondary | ICD-10-CM

## 2011-08-04 DIAGNOSIS — R001 Bradycardia, unspecified: Secondary | ICD-10-CM

## 2011-08-04 DIAGNOSIS — IMO0002 Reserved for concepts with insufficient information to code with codable children: Secondary | ICD-10-CM

## 2011-08-04 DIAGNOSIS — I779 Disorder of arteries and arterioles, unspecified: Secondary | ICD-10-CM

## 2011-08-04 DIAGNOSIS — I480 Paroxysmal atrial fibrillation: Secondary | ICD-10-CM

## 2011-08-04 NOTE — Assessment & Plan Note (Signed)
With paroxysmal atrial fibrillation, Coumadin has been started. This is probably the basis of the a stroke that he had in the past.

## 2011-08-04 NOTE — Procedures (Signed)
NAME:  Dylan Morgan, Dylan Morgan                ACCOUNT NO.:  [id number]  MEDICAL RECORD NO.:  [id number]          PATIENT TYPE:  OUT  LOCATION:  SLEEP CENTER                 FACILITY[facility]  [facility]  PHYSICIAN:  Barbaraann Share, MD,FCCPDATE OF BIRTH:  18-[birth year]1943  DATE OF STUDY:  07/24/2011                           NOCTURNAL POLYSOMNOGRAM  REFERRING PHYSICIAN:  Tereso Newcomer, PA-C  REFERRING PHYSICIAN:  Tereso Newcomer, PA-C  INDICATION FOR STUDY:  Hypersomnia with sleep apnea.  EPWORTH SCORE SCORE:  4.  MEDICATIONS:  SLEEP ARCHITECTURE:  The patient had a total sleep time of 357 minutes with no slow wave sleep and decreased quantity of REM.  Sleep onset latency was normal at 17 minutes and REM onset was normal at 84 minutes. Sleep efficiency was 89% during the diagnostic portion of the study and 98% during the titration portion.  RESPIRATORY DATA:  The patient underwent a split night protocol where he was found to have 36 obstructive events in the first 122 minutes of sleep.  This gave him an apnea-hypopnea index during the diagnostic portion of the study of 18 events per hour.  The events occurred in all body positions, but were increased during REM.  Loud snoring was noted throughout.  By protocol, he was then fitted with a medium ResMed Quattro FX full face mask and CPAP titration was initiated.  At a pressure of 11 cm, he seemed to have good control of his obstructive events and no issues with pressure-induced central apneas.  His pressure was increased during the night, however, this resulted in pressure- induced centrals in large numbers.  OXYGEN DATA:  There was O2 desaturation as low as 80% with the patient's obstructive events.  CARDIAC DATA:  Occasional PAC and PVC noted, but no clinically significant arrhythmias were seen.  MOVEMENT-PARASOMNIA:  The patient had no significant leg jerks or other abnormal behaviors noted.  IMPRESSIONS-RECOMMENDATION: 1. Split night study reveals mild  obstructive sleep apnea/hypopnea     syndrome with an apnea-hypopnea index of 18 events per hour during     the diagnostic portion of the study.  He was then fitted with a     medium ResMed head Quattro FX full face mask, and found to have an     optimal continuous positive airway pressure of 11 cm of water.     Because the patient had mild sleep apnea during the diagnostic     portion, would also consider other treatment options such as a     trial of weight loss alone, upper airway surgery, as well as dental     appliance.  Clinical correlation is suggested. 2. Occasional premature atrial contraction and premature ventricular     contraction noted, but no clinically significant arrhythmias were     seen.     Barbaraann Share, MD,FCCP Diplomate, American Board of Sleep Medicine    [facility]  D:  08/04/2011 16:10:96  T:  08/04/2011 08:46:02  Job:  [id number]

## 2011-08-04 NOTE — Progress Notes (Signed)
HPI -Patient is seen today to followup bradycardia and paroxysmal atrial fib. I had seen him last in 2010. He was seen in the office on July 06, 2011 with a very careful and complete note by Tereso Newcomer. There was bradycardia. He wore an event recorder. He had paroxysmal atrial fibrillation and Coumadin was started. This is being followed carefully. He has a history of bradycardia. He continues to have bradycardia. He says he feels fine and is not having any significant symptoms. He is being evaluated for sleep apnea. I suspect he may need CPAP.  Allergies  Allergen Reactions  . Diltiazem Hcl   . Oxycodone-Acetaminophen     Current Outpatient Prescriptions  Medication Sig Dispense Refill  . lisinopril (PRINIVIL,ZESTRIL) 10 MG tablet Take 10 mg by mouth daily. ( NOT TAKING )      . Multiple Vitamins-Minerals (MULTIVITAMIN PO) Take by mouth daily.      . simvastatin (ZOCOR) 10 MG tablet Take 1 tablet (10 mg total) by mouth at bedtime.  90 tablet  4  . vitamin E 1000 UNIT capsule Take 1,000 Units by mouth daily.        Marland Kitchen warfarin (COUMADIN) 5 MG tablet Take 1 tablet (5 mg total) by mouth as directed.  30 tablet  2  . DISCONTD: sildenafil (VIAGRA) 100 MG tablet Take 1 tablet (100 mg total) by mouth as needed for erectile dysfunction.  20 tablet  1    History   Social History  . Marital Status: Single    Spouse Name: N/A    Number of Children: N/A  . Years of Education: N/A   Occupational History  . disabled    Social History Main Topics  . Smoking status: Former Smoker    Quit date: 02/05/2009  . Smokeless tobacco: Not on file  . Alcohol Use: No  . Drug Use: No  . Sexually Active: Not on file   Other Topics Concern  . Not on file   Social History Narrative   Lives with son and his wife.Divorced.Regular exercise.    Family History  Problem Relation Age of Onset  . Diabetes Mother   . Stroke Father   . Cancer Sister     thyroid cancer  . Heart disease Brother    Coronary artery disease    Past Medical History  Diagnosis Date  . CAD (coronary artery disease) CABG in 2004    F/U by Dr Genia Harold cardiology, cardiolite scan done elsewhere in October 2010 that was normal, no ischemia seen, EF 55-65% in 02/2005  . GERD (gastroesophageal reflux disease)   . Hypertension   . Hyperlipidemia   . Renal insufficiency   . Cellulitis and abscess of leg     right leg  . Tobacco abuse   . Chronic back pain   . Chronic neck pain   . Seborrheic dermatitis of scalp   . Abnormal LFTs     Hx of abnormal LFT's  . Bradycardia   . AAA (abdominal aortic aneurysm)     November 2010, Stenting, November, 2010  . Paroxysmal atrial fibrillation 02/22/2009    diltiazem po started but discontinued due to side effects.  . Stroke   . Ejection fraction     EF 40% in the past  /   EF 55-65% echo, November, 2006  . Hx of CABG     2004    Past Surgical History  Procedure Date  . Coronary artery bypass graft 2004  ROS    Patient is oriented to person time and place. Affect is normal. There is no jugular venous distention. Lungs are clear. Respiratory effort is nonlabored. Cardiac exam reveals S1 and S2. There are no clicks or significant murmurs. Abdomen is soft. There is no peripheral edema.  PHYSICAL EXAM  Filed Vitals:   08/04/11 1118  BP: 129/73  Pulse: 44  Height: 5\' 11"  (1.803 m)  Weight: 248 lb 6.4 oz (112.674 kg)     ASSESSMENT & PLAN

## 2011-08-04 NOTE — Assessment & Plan Note (Signed)
The patient is not having any chest pain. He does not need any further evaluation of his coronary disease at this time.

## 2011-08-04 NOTE — Assessment & Plan Note (Signed)
This is being assessed further.

## 2011-08-04 NOTE — Assessment & Plan Note (Signed)
He has not been having any significant dizziness. No change in therapy.

## 2011-08-04 NOTE — Assessment & Plan Note (Signed)
Patient has carotid artery disease it is significant in being followed. No change in therapy.

## 2011-08-04 NOTE — Patient Instructions (Addendum)
Your physician recommends that you schedule a follow-up appointment in: 9 weeks

## 2011-08-04 NOTE — Assessment & Plan Note (Signed)
He is being coumadinized carefully.

## 2011-08-04 NOTE — Assessment & Plan Note (Signed)
Patient has chronic bradycardia. His rate continues to be slow. He is not on any medications for rate control. He understands that at some point he might need a pacemaker. This is not indicated at this time.

## 2011-08-10 ENCOUNTER — Encounter: Payer: Self-pay | Admitting: Ophthalmology

## 2011-08-10 ENCOUNTER — Ambulatory Visit (INDEPENDENT_AMBULATORY_CARE_PROVIDER_SITE_OTHER): Payer: Medicare Other | Admitting: *Deleted

## 2011-08-10 ENCOUNTER — Ambulatory Visit (INDEPENDENT_AMBULATORY_CARE_PROVIDER_SITE_OTHER): Payer: Medicare Other | Admitting: Ophthalmology

## 2011-08-10 VITALS — BP 107/79 | HR 54 | Temp 97.0°F | Ht 71.0 in | Wt 250.9 lb

## 2011-08-10 DIAGNOSIS — I6350 Cerebral infarction due to unspecified occlusion or stenosis of unspecified cerebral artery: Secondary | ICD-10-CM

## 2011-08-10 DIAGNOSIS — Z79899 Other long term (current) drug therapy: Secondary | ICD-10-CM

## 2011-08-10 DIAGNOSIS — Z7901 Long term (current) use of anticoagulants: Secondary | ICD-10-CM

## 2011-08-10 DIAGNOSIS — G8929 Other chronic pain: Secondary | ICD-10-CM

## 2011-08-10 DIAGNOSIS — M549 Dorsalgia, unspecified: Secondary | ICD-10-CM

## 2011-08-10 DIAGNOSIS — I480 Paroxysmal atrial fibrillation: Secondary | ICD-10-CM

## 2011-08-10 DIAGNOSIS — G4730 Sleep apnea, unspecified: Secondary | ICD-10-CM

## 2011-08-10 DIAGNOSIS — G4733 Obstructive sleep apnea (adult) (pediatric): Secondary | ICD-10-CM

## 2011-08-10 DIAGNOSIS — M545 Low back pain, unspecified: Secondary | ICD-10-CM

## 2011-08-10 DIAGNOSIS — N289 Disorder of kidney and ureter, unspecified: Secondary | ICD-10-CM

## 2011-08-10 DIAGNOSIS — I4891 Unspecified atrial fibrillation: Secondary | ICD-10-CM

## 2011-08-10 DIAGNOSIS — I639 Cerebral infarction, unspecified: Secondary | ICD-10-CM

## 2011-08-10 MED ORDER — ACETAMINOPHEN 325 MG PO TABS: 650.0000 mg | ORAL_TABLET | ORAL | Status: DC | PRN

## 2011-08-10 MED ORDER — SILDENAFIL CITRATE 100 MG PO TABS: 100.0000 mg | ORAL_TABLET | ORAL | Status: DC | PRN

## 2011-08-10 NOTE — Patient Instructions (Addendum)
-  take tylenol 650mg  every 4 hours for 2 weeks to get pain under control, then can take as needed for pain -Dylan Morgan will call you with a physical therapy appointment and a dental appointment

## 2011-08-10 NOTE — Assessment & Plan Note (Addendum)
Advised patient to use tylenol 650mg  4 times a day for 2 weeks to get pain under control and then use as needed. Would avoid NSAIDs as he is on coumadin and has borderline creatinine. Will refer to physical therapy.

## 2011-08-10 NOTE — Assessment & Plan Note (Signed)
Referred patient to dental clinic for dental device to help with obstructive sleep apnea.

## 2011-08-10 NOTE — Progress Notes (Signed)
Subjective:   Patient ID: CHRYSTIAN CUPPLES male   DOB: 1945/05/31 66 y.o.   MRN: [id number]  HPI: Mr.Aurthur L Hearne is a 66 y.o. man who presents for routine follow up  Dizziness- has gone away since started coumadin  A Fib- on coumadin, stopped plavix  Back pain-  having some pain in his lower back, about 15 years, he hurt his back when he reached to catch some falling sheet rock. He has been taking advil- 2 pills once in a while as needed. Has been bothering more for the past year. Recent rain has made things worse. Nothing to strain his back recently. Hurts when he makes certain movements, like standing up.  Sleep apnea- not waking up at night, not feeling sleepy during the day. Is snoring sometimes and talking in his sleep.    Has been walking about 45 minutes 3-4 times a week, trying to avoid salty foods  Past Medical History  Diagnosis Date  . CAD (coronary artery disease) CABG in 2004    F/U by Dr Genia Harold cardiology, cardiolite scan done elsewhere in October 2010 that was normal, no ischemia seen, EF 55-65% in 02/2005  . GERD (gastroesophageal reflux disease)   . Hypertension   . Hyperlipidemia   . Renal insufficiency   . Cellulitis and abscess of leg     right leg  . Tobacco abuse   . Chronic back pain   . Chronic neck pain   . Seborrheic dermatitis of scalp   . Abnormal LFTs     Hx of abnormal LFT's  . Bradycardia   . AAA (abdominal aortic aneurysm)     November 2010, Stenting, November, 2010  . Paroxysmal atrial fibrillation 02/22/2009    diltiazem po started but discontinued due to side effects.  . Stroke   . Ejection fraction     EF 40% in the past  /   EF 55-65% echo, November, 2006  . Hx of CABG     2004  . Carotid artery disease     Doppler, October, 2012, and old LICA occlusion , RICA no significant abnormality   Current Outpatient Prescriptions  Medication Sig Dispense Refill  . warfarin (COUMADIN) 5 MG tablet Take 1 tablet (5 mg total) by mouth as  directed.  30 tablet  2  . lisinopril (PRINIVIL,ZESTRIL) 10 MG tablet Take 10 mg by mouth daily. ( NOT TAKING )      . Multiple Vitamins-Minerals (MULTIVITAMIN PO) Take by mouth daily.      . simvastatin (ZOCOR) 10 MG tablet Take 1 tablet (10 mg total) by mouth at bedtime.  90 tablet  4  . vitamin E 1000 UNIT capsule Take 1,000 Units by mouth daily.        Marland Kitchen DISCONTD: sildenafil (VIAGRA) 100 MG tablet Take 1 tablet (100 mg total) by mouth as needed for erectile dysfunction.  20 tablet  1   Family History  Problem Relation Age of Onset  . Diabetes Mother   . Stroke Father   . Cancer Sister     thyroid cancer  . Heart disease Brother     Coronary artery disease   History   Social History  . Marital Status: Single    Spouse Name: N/A    Number of Children: N/A  . Years of Education: N/A   Occupational History  . disabled    Social History Main Topics  . Smoking status: Former Smoker    Quit date: 02/05/2009  .  Smokeless tobacco: None  . Alcohol Use: No  . Drug Use: No  . Sexually Active: None   Other Topics Concern  . None   Social History Narrative   Lives with son and his wife.Divorced.Regular exercise.    Objective:  Physical Exam: Filed Vitals:   08/10/11 1317  BP: 107/79  Pulse: 54  Temp: 97 F (36.1 C)  TempSrc: Oral  Height: 5\' 11"  (1.803 m)  Weight: 250 lb 14.4 oz (113.807 kg)  SpO2: 96%   General: pleasant elderly man sitting in chair HEENT: PERRL, EOMI, no scleral icterus Cardiac: RRR, no rubs, murmurs or gallops Pulm: clear to auscultation bilaterally, moving normal volumes of air Abd: soft, nontender, nondistended, BS present Back: patient has tenderness on back flexion and laying supine, point tenderness over lumbar spine. Ext: warm and well perfused, no pedal edema Neuro: alert and oriented X3, cranial nerves II-XII grossly intact, strength full- hip and foot flexion/extension, sensation to light touch intact  Assessment & Plan:

## 2011-08-11 LAB — BASIC METABOLIC PANEL
BUN: 33 mg/dL — ABNORMAL HIGH (ref 6–23)
CO2: 26 mEq/L (ref 19–32)
Chloride: 104 mEq/L (ref 96–112)
Creat: 1.53 mg/dL — ABNORMAL HIGH (ref 0.50–1.35)
Glucose, Bld: 83 mg/dL (ref 70–99)
Potassium: 4.8 mEq/L (ref 3.5–5.3)

## 2011-08-16 ENCOUNTER — Inpatient Hospital Stay (HOSPITAL_COMMUNITY)
Admission: EM | Admit: 2011-08-16 | Discharge: 2011-08-18 | DRG: 065 | Disposition: A | Discharge status: Home / Self Care | Payer: Medicare Other | Attending: Internal Medicine | Admitting: Internal Medicine

## 2011-08-16 ENCOUNTER — Encounter (HOSPITAL_COMMUNITY): Payer: Self-pay | Admitting: Emergency Medicine

## 2011-08-16 ENCOUNTER — Emergency Department (HOSPITAL_COMMUNITY): Payer: Medicare Other

## 2011-08-16 DIAGNOSIS — I4891 Unspecified atrial fibrillation: Secondary | ICD-10-CM | POA: Diagnosis present

## 2011-08-16 DIAGNOSIS — Z79899 Other long term (current) drug therapy: Secondary | ICD-10-CM

## 2011-08-16 DIAGNOSIS — I658 Occlusion and stenosis of other precerebral arteries: Secondary | ICD-10-CM | POA: Diagnosis present

## 2011-08-16 DIAGNOSIS — G8190 Hemiplegia, unspecified affecting unspecified side: Secondary | ICD-10-CM | POA: Diagnosis present

## 2011-08-16 DIAGNOSIS — N189 Chronic kidney disease, unspecified: Secondary | ICD-10-CM | POA: Diagnosis present

## 2011-08-16 DIAGNOSIS — G4733 Obstructive sleep apnea (adult) (pediatric): Secondary | ICD-10-CM | POA: Diagnosis present

## 2011-08-16 DIAGNOSIS — M549 Dorsalgia, unspecified: Secondary | ICD-10-CM | POA: Diagnosis present

## 2011-08-16 DIAGNOSIS — I2510 Atherosclerotic heart disease of native coronary artery without angina pectoris: Secondary | ICD-10-CM | POA: Diagnosis present

## 2011-08-16 DIAGNOSIS — Z8673 Personal history of transient ischemic attack (TIA), and cerebral infarction without residual deficits: Secondary | ICD-10-CM

## 2011-08-16 DIAGNOSIS — I779 Disorder of arteries and arterioles, unspecified: Secondary | ICD-10-CM

## 2011-08-16 DIAGNOSIS — I6529 Occlusion and stenosis of unspecified carotid artery: Secondary | ICD-10-CM | POA: Diagnosis present

## 2011-08-16 DIAGNOSIS — R29810 Facial weakness: Secondary | ICD-10-CM | POA: Diagnosis present

## 2011-08-16 DIAGNOSIS — G8929 Other chronic pain: Secondary | ICD-10-CM | POA: Diagnosis present

## 2011-08-16 DIAGNOSIS — I639 Cerebral infarction, unspecified: Secondary | ICD-10-CM

## 2011-08-16 DIAGNOSIS — Z951 Presence of aortocoronary bypass graft: Secondary | ICD-10-CM

## 2011-08-16 DIAGNOSIS — E785 Hyperlipidemia, unspecified: Secondary | ICD-10-CM | POA: Diagnosis present

## 2011-08-16 DIAGNOSIS — R7309 Other abnormal glucose: Secondary | ICD-10-CM | POA: Diagnosis present

## 2011-08-16 DIAGNOSIS — I6350 Cerebral infarction due to unspecified occlusion or stenosis of unspecified cerebral artery: Principal | ICD-10-CM | POA: Diagnosis present

## 2011-08-16 HISTORY — DX: Unspecified osteoarthritis, unspecified site: M19.90

## 2011-08-16 LAB — COMPREHENSIVE METABOLIC PANEL
ALT: 24 U/L (ref 0–53)
AST: 35 U/L (ref 0–37)
Albumin: 4 g/dL (ref 3.5–5.2)
Alkaline Phosphatase: 82 U/L (ref 39–117)
Chloride: 103 mEq/L (ref 96–112)
Potassium: 4.5 mEq/L (ref 3.5–5.1)
Sodium: 137 mEq/L (ref 135–145)
Total Bilirubin: 0.3 mg/dL (ref 0.3–1.2)
Total Protein: 7.6 g/dL (ref 6.0–8.3)

## 2011-08-16 LAB — APTT: aPTT: 40 seconds — ABNORMAL HIGH (ref 24–37)

## 2011-08-16 LAB — DIFFERENTIAL
Basophils Relative: 0 % (ref 0–1)
Eosinophils Absolute: 0.1 10*3/uL (ref 0.0–0.7)
Monocytes Relative: 13 % — ABNORMAL HIGH (ref 3–12)
Neutro Abs: 3.2 10*3/uL (ref 1.7–7.7)
Neutrophils Relative %: 56 % (ref 43–77)

## 2011-08-16 LAB — POCT I-STAT, CHEM 8
BUN: 46 mg/dL — ABNORMAL HIGH (ref 6–23)
Calcium, Ion: 1.24 mmol/L (ref 1.12–1.32)
Creatinine, Ser: 2.7 mg/dL — ABNORMAL HIGH (ref 0.50–1.35)
Glucose, Bld: 110 mg/dL — ABNORMAL HIGH (ref 70–99)
Sodium: 141 mEq/L (ref 135–145)
TCO2: 22 mmol/L (ref 0–100)

## 2011-08-16 LAB — CBC
Hemoglobin: 13.4 g/dL (ref 13.0–17.0)
MCH: 30.3 pg (ref 26.0–34.0)
MCHC: 34.2 g/dL (ref 30.0–36.0)
Platelets: 139 10*3/uL — ABNORMAL LOW (ref 150–400)
RBC: 4.42 MIL/uL (ref 4.22–5.81)

## 2011-08-16 LAB — PROTIME-INR
INR: 2.43 — ABNORMAL HIGH (ref 0.00–1.49)
Prothrombin Time: 26.8 seconds — ABNORMAL HIGH (ref 11.6–15.2)

## 2011-08-16 LAB — RAPID URINE DRUG SCREEN, HOSP PERFORMED
Amphetamines: NOT DETECTED
Benzodiazepines: NOT DETECTED
Cocaine: NOT DETECTED
Opiates: NOT DETECTED
Tetrahydrocannabinol: NOT DETECTED

## 2011-08-16 LAB — TROPONIN I: Troponin I: <0.3 ng/mL (ref <0.30)

## 2011-08-16 MED ORDER — SODIUM CHLORIDE 0.9 % IJ SOLN
3.0000 mL | INTRAMUSCULAR | Status: AC
  Administered 2011-08-16: 3 mL via INTRAVENOUS

## 2011-08-16 NOTE — ED Notes (Signed)
CRITICAL VALUE ALERT  Critical value received: CKMB 6.7  Date of notification:  08/16/2011  Time of notification: 2210  Critical value read back:yes  Nurse who received alert:  Cassie Freer, RN  MD notified: Dr. Fonnie Jarvis, EDP

## 2011-08-16 NOTE — H&P (Signed)
Hospital Admission Note Date: 08/16/2011  Patient name: Dylan Morgan Medical record number: [id number] Date of birth: [birth year] Age: 66 y.o. Gender: male PCP: Margorie John, MD, MD  Medical Service: Internal Medicine Teaching Service - Maurice March  Attending physician:  Doneen Poisson, MD   1st Contact:   Lorretta Harp, MD  Pager: [phone] 2nd Contact:   Johnette Abraham Pager: [phone] After 5 pm or weekends: 1st Contact:      Pager: [phone] 2nd Contact:      Pager: [phone]  Chief Complaint: left arm and left leg weakness  History of Present Illness:  [name] is a 66 yo male with c/o acute onset of left arm and leg weakness and numbness which began approximately 30 minutes prior to presentation to he ED. His hx is  significant for substantial vascular pathology including right CVA in Sept 2012 initially management with Plavix until April 2013 then subsequently bridged to Warfarin (INR 2.4), paroxysmal atrial fibrillation, total left internal carotid occlusion and 44% occlusion of right carotid bifurcation on CT of head/neck Oct 2012, CAD s/p CABG 2004 with EF 55-65% 2006, bradycardia, abdominal aortic aneurysm s/p stent Nov 2010 and renal insufficiency with baseline creatinine 1.2-1.5.  Mr. Dettman states that he was in his usual state of health until this evening when he was in bed an attempted to roll over but found his arm to be numb and weak. He got out of the bed and realized that he difficulty walking due to numbness and weakness in his left leg.  He states that this feels like his last stroke but has nearly resolved by time of evaluation by Internal Medicine Service (~2.5 hours after onset of symptoms).  He denies headache, nausea, vomiting, loss of consciousness, visual aura, vertigo, seizure, confusion, trouble speaking, incontinence of stool or urine, trouble swallowing, sob, chest pain or palpitations.  ED course: CT of head w/o acute intracranial abnormalities with notation of small  vessel white matter changes; no tPA due to rapid resolution os symptoms and therapeutic INR on Warfarin    Meds:  (Not in a hospital admission) No current facility-administered medications on file prior to encounter.   Current Outpatient Prescriptions on File Prior to Encounter  Medication Sig Dispense Refill  . acetaminophen (TYLENOL) 325 MG tablet Take 2 tablets (650 mg total) by mouth every 4 (four) hours as needed for pain.  100 tablet  2  . lisinopril (PRINIVIL,ZESTRIL) 10 MG tablet Take 10 mg by mouth daily.       . Multiple Vitamins-Minerals (MULTIVITAMIN PO) Take 1 tablet by mouth daily.       . sildenafil (VIAGRA) 100 MG tablet Take 1 tablet (100 mg total) by mouth as needed for erectile dysfunction.  6 tablet  5  . simvastatin (ZOCOR) 10 MG tablet Take 1 tablet (10 mg total) by mouth at bedtime.  90 tablet  4  . vitamin E 1000 UNIT capsule Take 1,000 Units by mouth daily.        Marland Kitchen DISCONTD: warfarin (COUMADIN) 5 MG tablet Take 1 tablet (5 mg total) by mouth as directed.  30 tablet  2   Allergies: Allergies as of 08/16/2011 - Review Complete 08/16/2011  Allergen Reaction Noted  . Diltiazem hcl Other (See Comments)   . Oxycodone-acetaminophen Other (See Comments)    Past Medical History  Diagnosis Date  . CAD (coronary artery disease) CABG in 2004    F/U by Dr Genia Harold cardiology, cardiolite scan done elsewhere in October 2010 that  was normal, no ischemia seen, EF 55-65% in 02/2005  . GERD (gastroesophageal reflux disease)   . Hypertension   . Hyperlipidemia   . Renal insufficiency   . Cellulitis and abscess of leg     right leg  . Tobacco abuse   . Chronic back pain   . Chronic neck pain   . Seborrheic dermatitis of scalp   . Abnormal LFTs     Hx of abnormal LFT's  . Bradycardia   . AAA (abdominal aortic aneurysm)     November 2010, Stenting, November, 2010  . Paroxysmal atrial fibrillation 02/22/2009    diltiazem po started but discontinued due to side  effects.  . Stroke   . Ejection fraction     EF 40% in the past  /   EF 55-65% echo, November, 2006  . Hx of CABG     2004  . Carotid artery disease     Doppler, October, 2012, and old LICA occlusion , RICA no significant abnormality   Past Surgical History  Procedure Date  . Coronary artery bypass graft 2004   Family History  Problem Relation Age of Onset  . Diabetes Mother   . Stroke Father   . Cancer Sister     thyroid cancer  . Heart disease Brother     Coronary artery disease   History   Social History  . Marital Status: Single    Spouse Name: N/A    Number of Children: N/A  . Years of Education: N/A   Occupational History  . disabled    Social History Main Topics  . Smoking status: Former Smoker    Quit date: 02/05/2009  . Smokeless tobacco: Not on file  . Alcohol Use: No  . Drug Use: No  . Sexually Active: Not on file   Other Topics Concern  . Not on file   Social History Narrative   Lives with son and his wife.Divorced.Regular exercise.    Review of Systems: Constitutional:  Denies fever, chills, diaphoresis  HEENT: Denies photophobia, eye pain, congestion, trouble swallowing, neck pain.  Respiratory: Denies SOB, DOE, cough, chest tightness, and wheezing.  Cardiovascular: Denies chest pain, palpitations and leg swelling.  Gastrointestinal: Denies nausea, vomiting, abdominal pain, diarrhea, constipation, blood in stool and abdominal distention.  Genitourinary: Denies dysuria, urgency, frequency, hematuria, flank pain and difficulty urinating.  Musculoskeletal: Endorse difficulty with gait today, + bilateral leg soreness "off and on" for >1 year, + chronic back pain   Skin: Denies rash  Neurological: Endorses left arm and leg weakness and numbness, Denies dizziness, seizures, syncope, light-headedness, numbness and headaches.   Psychiatric/ Behavioral: Denies mood changes, confusion.     Physical Exam: Blood pressure 99/63, pulse 62, temperature  98.4 F (36.9 C), temperature source Oral, resp. rate 20, SpO2 95.00%. General: Well-developed, well-nourished, pleasant white male, in no acute distress; lying flat on stretcher, appropriate and cooperative throughout examination, non-toxic appearing. Head: Normocephalic, atraumatic, +facial telangiectasias, mild right-sided mouth drop Eyes: PERRLA, EOMI, decrease left lateral peripheral visual field, no nystagmus, No signs of anemia or jaundice. Nose: Moist mucous membranes, no purulence, normal turbinates Throat: edentulous, Oropharynx nonerythematous, no exudate appreciated, no oral lacerations.  Neck: supple, no masses, no carotid Bruits, no JVD appreciated. Lungs: Normal respiratory effort. Clear to auscultation bilaterally from apices to bases without crackles or wheezes appreciated. Heart: normal rate, regular rhythm, normal S1 and S2, no gallop, murmur, or rubs appreciated. Abdomen: obese, BS normoactive. Soft, Nondistended, non-tender. No masses or organomegaly  appreciated, aortic pulses intact bilaterally Extremities: No pretibial edema, distal pulses intact and equal, no rashes  Neurologic: alert and oriented x3, CN 2-12 grossly intact, no facial numbness or weakness, no ptosis, symmetrical midline tongue extension, normal sensory throughout, 5/5 motor strength BUE and BLE, normal 2+ deep tendon reflexes at epicondylar, patellar and achilles, cerebellar with delayed finger-to-nose with left extremity, normal heel-to-shin, fluent speech w/o evidence of aphasia, neg Babinski, memory and concentration intact   Lab results: Basic Metabolic Panel:  [facility] 08/16/11 2120 08/16/11 2059  NA 141 137  K 4.4 4.5  CL 109 103  CO2 -- 21  GLUCOSE 110* 109*  BUN 46* 48*  CREATININE 2.70* 2.42*  CALCIUM -- 9.8  MG -- --  PHOS -- --   Liver Function Tests:  [facility] 08/16/11 2059  AST 35  ALT 24  ALKPHOS 82  BILITOT 0.3  PROT 7.6  ALBUMIN 4.0    CBC:  [facility] 08/16/11 2120  08/16/11 2059  WBC -- 5.8  NEUTROABS -- 3.2  HGB 13.3 13.4  HCT 39.0 39.2  MCV -- 88.7  PLT -- 139*   Cardiac Enzymes:  [facility] 08/16/11 2059  CKTOTAL 404*  CKMB 6.7*  CKMBINDEX --  TROPONINI <0.30   CBG:  [facility] 08/16/11 2101  GLUCAP 110*   Hemoglobin A1C: No results found for this basename: HGBA1C in the last 72 hours Fasting Lipid Panel: No results found for this basename: CHOL,HDL,LDLCALC,TRIG,CHOLHDL,LDLDIRECT in the last 72 hours Thyroid Function Tests: No results found for this basename: TSH,T4TOTAL,FREET4,T3FREE,THYROIDAB in the last 72 hours Anemia Panel: No results found for this basename: VITAMINB12,FOLATE,FERRITIN,TIBC,IRON,RETICCTPCT in the last 72 hours Coagulation:  [facility] 08/16/11 2059  LABPROT 26.8*  INR 2.43*   Urine Drug Screen: Drugs of Abuse     Component Value Date/Time   LABOPIA NONE DETECTED 08/16/2011 2137   COCAINSCRNUR NONE DETECTED 08/16/2011 2137   LABBENZ NONE DETECTED 08/16/2011 2137   AMPHETMU NONE DETECTED 08/16/2011 2137   THCU NONE DETECTED 08/16/2011 2137   LABBARB NONE DETECTED 08/16/2011 2137    Urinalysis: No results found for this basename: COLORURINE:2,APPERANCEUR:2,LABSPEC:2,PHURINE:2,GLUCOSEU:2,HGBUR:2,BILIRUBINUR:2,KETONESUR:2,PROTEINUR:2,UROBILINOGEN:2,NITRITE:2,LEUKOCYTESUR:2 in the last 72 hours   Imaging results:  Ct Head Wo Contrast  08/16/2011  *RADIOLOGY REPORT*  Clinical Data: Code stroke.  Slurred speech with left arm weakness and left facial droop.  Left leg weakness.  CT HEAD WITHOUT CONTRAST  Technique:  Contiguous axial images were obtained from the base of the skull through the vertex without contrast.  Comparison: 01/24/2011.  Findings: No evidence of acute infarct, acute hemorrhage, mass lesion, mass effect or hydrocephalus.  There is patchy low attenuation in the periventricular white matter.  Mild atrophy. Visualized portions of the paranasal sinuses and mastoid air cells are clear.  IMPRESSION:  1.  No  acute intracranial abnormality. These results were called by telephone on 08/16/2011  at  2119 hours to  Dr. Fonnie Jarvis, who verbally acknowledged these results. 2.  Atrophy and chronic microvascular white matter ischemic changes.  Original Report Authenticated By: Reyes Ivan, M.D.    Other results: EKG: NSR 68 bpm, rhythm c/w prior EKG 05/26/2011 with T-wave inversions V1-V2, AVL,  Assessment & Plan by Problem: 66 yo male with significant past medical hx admitted with left-sided weakness and numbness worrisome for TIA versus recurrent subcortical stroke.  1. Left sided weakness/facial droop: Likely TIA given his intermittent symptoms and quick resolution, will rule out recurrent subcortical stroke given his hx of paroxysmal atrial fibrillation on coumadin and right MCA infarct in  12/2010, and other risk factors of Hypertension and left carotid occlusion as well as 44% occlusion of right bifurcation seen on CTA of head&neck in 01/2011. CT head on this admission is w/o acute intracranial abnormalities. Neurology has already evaluated patient and deemed that patient is not a candidate for tPA because of therapeutic INR of 2.43 and quick resolution of his symptoms; however,on exam by our service pt reports that symptoms now come and go. -Will admit to telemetry with TIA protocol  -Will continue Coumadin per pharmacy  -Neuro checks q4hrs  -Get MRI/MRA of brain & Carotid doppler  -2D echocardiogram  -Risk stratifications: HbA1c and FLP  -Keep NPO until pass Swallow eval  -PT/OT consults   2. Hypertension- well controlled on lisinopril 10 mg qd. BP 127/74.  -Will hold ACEi at this time given his acute elevation of Cr (acute on chronic CKD)   3. Acute on chronic CKD: Baseline Cr is 1.2-1.5. Admission Cr of 2.70 could be due to the initiation of Lisinopril in February 2013. This is ~50% increase in Cr from baseline may indicate renovascular problem; therefore, will need to hold ACEi at this time.  BUN/Cr ratio is 17 which makes pre-renal azotemia less likely.  -Will get Urinalysis   4. Hyperlipidemia: Well controlled. Last LDL on 68, total chol 160, HDL 33, except forTriglyceride 297 in 12/2010. He is currently taking Simvastatin 10mg  as outpatient. Of note, patient reports severe cramping of his legs in the past year and was actively having cramping tonight. Elevated CK of 404 and CK-Mb 6.7 thus will need to r/o rhabdomyolisis -Will hold Simvastatin tonight to see if his symptoms improve and if it does, consider trying a different statin or maybe dosing every other day  -Will repeat Fasting Lipid Panel  -Consider adding fibrate if his triglyceride is still elevated  -Will trend CK  5. Hx CAD: s/p CABG in 2004 and AAA stent 2010. Stable, denies any chest pain or abdominal pain at this time. Patient is not on BB due to his bradycardia. Per Dr. Henrietta Hoover note, patient may need pacemaker in the future.   6. Chronic back pain: s/p mechanical fall in the past. Patient has been managed with Tylenol as outpatient and avoid NSAIDs due to his CKD.  -Will continue Tylenol for now and if not well controlled, may add stronger pain med   DVT ppx: Warfarin for paroxysmal afib   Signed: Kristie Cowman 08/16/2011, 11:38 PM

## 2011-08-16 NOTE — ED Provider Notes (Signed)
History     CSN: [id number]  Arrival date & time 08/16/11  2055   First MD Initiated Contact with Patient 08/16/11 2057      Chief Complaint  Patient presents with  . Code Stroke    (Consider location/radiation/quality/duration/timing/severity/associated sxs/prior treatment) HPI This 66 year old male was awake lying in bed last known well at 20 minutes after 8:00 this evening when he had sudden onset of left face arm and leg weakness and numbness to moderate in severity with no pain. He is no lightheadedness. He is blind in his left eye at baseline it is unchanged tonight. No change in vision in his right eye. He is no change in speech or swallowing or understanding is no chest pain palpitations lightheadedness vertigo shortness of breath or trauma. Past Medical History  Diagnosis Date  . CAD (coronary artery disease) CABG in 2004    F/U by Dr Genia Harold cardiology, cardiolite scan done elsewhere in October 2010 that was normal, no ischemia seen, EF 55-65% in 02/2005  . GERD (gastroesophageal reflux disease)   . Hypertension   . Hyperlipidemia   . Renal insufficiency   . Cellulitis and abscess of leg     right leg  . Tobacco abuse   . Chronic back pain   . Chronic neck pain   . Seborrheic dermatitis of scalp   . Abnormal LFTs     Hx of abnormal LFT's  . Bradycardia   . AAA (abdominal aortic aneurysm)     November 2010, Stenting, November, 2010  . Paroxysmal atrial fibrillation 02/22/2009    diltiazem po started but discontinued due to side effects.  . Stroke   . Ejection fraction     EF 40% in the past  /   EF 55-65% echo, November, 2006  . Hx of CABG     2004  . Carotid artery disease     Doppler, October, 2012, and old LICA occlusion , RICA no significant abnormality  . Arthritis   . Prediabetes     Past Surgical History  Procedure Date  . Coronary artery bypass graft 2004  . Rotator cuff repair     Family History  Problem Relation Age of Onset  .  Diabetes Mother   . Stroke Father   . Cancer Sister     thyroid cancer  . Heart disease Brother     Coronary artery disease    History  Substance Use Topics  . Smoking status: Former Smoker    Quit date: 02/05/2009  . Smokeless tobacco: Never Used  . Alcohol Use: No      Review of Systems  Constitutional: Negative for fever.       10 Systems reviewed and are negative for acute change except as noted in the HPI.  HENT: Negative for congestion.   Eyes: Negative for discharge and redness.  Respiratory: Negative for cough and shortness of breath.   Cardiovascular: Negative for chest pain.  Gastrointestinal: Negative for vomiting and abdominal pain.  Musculoskeletal: Negative for back pain.  Skin: Negative for rash.  Neurological: Positive for weakness and numbness. Negative for syncope and headaches.  Psychiatric/Behavioral:       No behavior change.    Allergies  Diltiazem hcl and Oxycodone-acetaminophen  Home Medications   Current Outpatient Rx  Name Route Sig Dispense Refill  . ACETAMINOPHEN 325 MG PO TABS Oral Take 2 tablets (650 mg total) by mouth every 4 (four) hours as needed for pain. 100 tablet 2  .  LISINOPRIL 10 MG PO TABS Oral Take 10 mg by mouth daily.     . MULTIVITAMIN PO Oral Take 1 tablet by mouth daily.     Marland Kitchen SILDENAFIL CITRATE 100 MG PO TABS Oral Take 1 tablet (100 mg total) by mouth as needed for erectile dysfunction. 6 tablet 5  . SIMVASTATIN 10 MG PO TABS Oral Take 1 tablet (10 mg total) by mouth at bedtime. 90 tablet 4  . VITAMIN E 1000 UNITS PO CAPS Oral Take 1,000 Units by mouth daily.      . WARFARIN SODIUM 5 MG PO TABS Oral Take 5 mg by mouth daily.      BP 132/75  Pulse 50  Temp(Src) 98.8 F (37.1 C) (Oral)  Resp 20  Ht 5\' 11"  (1.803 m)  Wt 241 lb 10 oz (109.6 kg)  BMI 33.70 kg/m2  SpO2 98%  Physical Exam  Nursing note and vitals reviewed. Constitutional:       Awake, alert, nontoxic appearance with baseline speech for patient.    HENT:  Head: Atraumatic.  Mouth/Throat: No oropharyngeal exudate.  Eyes: EOM are normal. Pupils are equal, round, and reactive to light. Right eye exhibits no discharge. Left eye exhibits no discharge.  Neck: Neck supple.  Cardiovascular: Normal rate and regular rhythm.   No murmur heard. Pulmonary/Chest: Effort normal and breath sounds normal. No stridor. No respiratory distress. He has no wheezes. He has no rales. He exhibits no tenderness.  Abdominal: Soft. Bowel sounds are normal. He exhibits no mass. There is no tenderness. There is no rebound.  Musculoskeletal: He exhibits no tenderness.       Baseline ROM, moves extremities with no obvious new focal weakness.  Lymphadenopathy:    He has no cervical adenopathy.  Neurological: He is alert.       GCS 15 Awake, alert, cooperative and aware of situation; motor strength 5/5 right and 4/5 on left arm and leg; sensation normal to light touch right and decreased on left side; peripheral visual fields full to confrontation right eye blind left eye baseline; has left droop facial asymmetry; tongue midline;has left pronator drift, normal finger to nose right arm cannot perform left arm  Skin: No rash noted.  Psychiatric: He has a normal mood and affect.    ED Course  Procedures (including critical care time) ECG: Sinus rhythm, ventricular rate 68, left axis deviation, nonspecific T wave changes, no significant change noted compared with February 2013  2120 Neuro in room with Pt Sxs resolved  tPA in stroke considered, but not given due to the following: Symptoms resolved Rapid improvement / severity too mild  2220 Stroke Sxs returned like on arrival but not candidate for tPA since INR 2.4, Neuro aware  CRITICAL CARE Performed by: Hurman Horn   Total critical care time:  Critical care time was exclusive of separately billable procedures and treating other patients.  Critical care was necessary to treat or prevent imminent or  life-threatening deterioration.  Critical care was time spent personally by me on the following activities: development of treatment plan with patient and/or surrogate as well as nursing, discussions with consultants, evaluation of patient's response to treatment, examination of patient, obtaining history from patient or surrogate, ordering and performing treatments and interventions, ordering and review of laboratory studies, ordering and review of radiographic studies, pulse oximetry and re-evaluation of patient's condition. Labs Reviewed  PROTIME-INR - Abnormal; Notable for the following:    Prothrombin Time 26.8 (*)    INR 2.43 (*)  All other components within normal limits  APTT - Abnormal; Notable for the following:    aPTT 40 (*)    All other components within normal limits  CBC - Abnormal; Notable for the following:    Platelets 139 (*)    All other components within normal limits  DIFFERENTIAL - Abnormal; Notable for the following:    Monocytes Relative 13 (*)    All other components within normal limits  COMPREHENSIVE METABOLIC PANEL - Abnormal; Notable for the following:    Glucose, Bld 109 (*)    BUN 48 (*)    Creatinine, Ser 2.42 (*)    GFR calc non Af Amer 26 (*)    GFR calc Af Amer 30 (*)    All other components within normal limits  CK TOTAL AND CKMB - Abnormal; Notable for the following:    Total CK 404 (*)    CK, MB 6.7 (*)    All other components within normal limits  GLUCOSE, CAPILLARY - Abnormal; Notable for the following:    Glucose-Capillary 110 (*)    All other components within normal limits  POCT I-STAT, CHEM 8 - Abnormal; Notable for the following:    BUN 46 (*)    Creatinine, Ser 2.70 (*)    Glucose, Bld 110 (*)    All other components within normal limits  BASIC METABOLIC PANEL - Abnormal; Notable for the following:    Glucose, Bld 113 (*)    BUN 46 (*)    Creatinine, Ser 1.94 (*)    GFR calc non Af Amer 34 (*)    GFR calc Af Amer 39 (*)    All  other components within normal limits  PROTIME-INR - Abnormal; Notable for the following:    Prothrombin Time 28.8 (*)    INR 2.66 (*)    All other components within normal limits  CBC - Abnormal; Notable for the following:    RBC 4.15 (*)    Hemoglobin 12.7 (*)    HCT 37.3 (*)    Platelets 123 (*)    All other components within normal limits  HEMOGLOBIN A1C - Abnormal; Notable for the following:    Hemoglobin A1C 6.0 (*)    Mean Plasma Glucose 126 (*)    All other components within normal limits  CARDIAC PANEL(CRET KIN+CKTOT+MB+TROPI) - Abnormal; Notable for the following:    Total CK 348 (*)    CK, MB 6.0 (*)    All other components within normal limits  CARDIAC PANEL(CRET KIN+CKTOT+MB+TROPI) - Abnormal; Notable for the following:    Total CK 318 (*)    CK, MB 5.8 (*)    All other components within normal limits  LIPID PANEL - Abnormal; Notable for the following:    Triglycerides 211 (*)    HDL 37 (*)    VLDL 42 (*)    All other components within normal limits  PROTIME-INR - Abnormal; Notable for the following:    Prothrombin Time 28.3 (*)    INR 2.60 (*)    All other components within normal limits  BASIC METABOLIC PANEL - Abnormal; Notable for the following:    Sodium 134 (*)    BUN 33 (*)    Creatinine, Ser 1.54 (*)    GFR calc non Af Amer 45 (*)    GFR calc Af Amer 52 (*)    All other components within normal limits  TROPONIN I  URINE RAPID DRUG SCREEN (HOSP PERFORMED)  HEPATIC FUNCTION PANEL  MAGNESIUM  MRSA PCR SCREENING   Mri Brain Without Contrast  08/17/2011  *RADIOLOGY REPORT*  Clinical Data:  TIA.  Left-sided facial weakness and facial droop. Slurred speech.  MRI HEAD WITHOUT CONTRAST MRA HEAD WITHOUT CONTRAST  Technique:  Multiplanar, multiecho pulse sequences of the brain and surrounding structures were obtained without intravenous contrast. Angiographic images of the head were obtained using MRA technique without contrast.  Comparison:  CT head  08/16/2011.  MRI brain 01/01/2011.  MRI HEAD  Findings:  Focal restricted diffusion is evident in the posterior right carinae corona radiata.  T2 and FLAIR signal is associated.  No hemorrhage or mass lesion is present.  Atrophy and moderate white matter disease is otherwise stable.  The left internal carotid artery is occluded below the posterior communicating artery.  Flow is present in the right internal carotid artery and vertebral arteries bilaterally.  The patient is status post scleral banding on the left.  A lens extraction has been performed on the right.  Mild mucosal thickening is noted in the anterior ethmoid air cells and maxillary sinuses.  The paranasal sinuses are otherwise clear.  There is some fluid in the mastoid air cells.  No obstructing nasopharyngeal lesion is evident.  IMPRESSION:  1.  Acute non hemorrhagic infarct of the posterior right corona radiata. 2.  Occluded left internal carotid artery, a chronic finding. 3.  Moderate atrophy and extensive white matter disease is otherwise stable.  MRA HEAD  Findings: The left internal carotid artery is chronically occluded. It is reconstituted at the level of the posterior communicating artery.  The right internal carotid artery is within normal limits. The right posterior communicating artery is patent.  The anterior communicating artery is patent.  The ACA branch vessels are within normal limits.  The MCA bifurcations are within normal limits bilaterally.  There is some tapering of distal MCA branch vessels, more apparent on the left.  The left vertebral artery is the dominant vessel.  The PICA origins are visualized and within normal limits bilaterally.  A prominent left AICA is also noted.  The basilar artery is within normal limits.  The left posterior cerebral artery originates from basilar tip. The right posterior cerebral artery is predominately of fetal type with a small right P1 segment.  There is some attenuation of distal PCA branch  vessels, more prominent on the left.  IMPRESSION:  1.  Occluded left internal carotid artery is reconstituted at the level of the posterior communicating artery. 2.  Moderate small vessel attenuation anteriorly and posteriorly is worse on the left.  This is likely a flow phenomenon anteriorly.  Original Report Authenticated By: Jamesetta Orleans. MATTERN, M.D.   Mr Maxine Glenn Head/brain Wo Cm  08/17/2011  *RADIOLOGY REPORT*  Clinical Data:  TIA.  Left-sided facial weakness and facial droop. Slurred speech.  MRI HEAD WITHOUT CONTRAST MRA HEAD WITHOUT CONTRAST  Technique:  Multiplanar, multiecho pulse sequences of the brain and surrounding structures were obtained without intravenous contrast. Angiographic images of the head were obtained using MRA technique without contrast.  Comparison:  CT head 08/16/2011.  MRI brain 01/01/2011.  MRI HEAD  Findings:  Focal restricted diffusion is evident in the posterior right carinae corona radiata.  T2 and FLAIR signal is associated.  No hemorrhage or mass lesion is present.  Atrophy and moderate white matter disease is otherwise stable.  The left internal carotid artery is occluded below the posterior communicating artery.  Flow is present in the right internal carotid artery and vertebral arteries bilaterally.  The patient is status post scleral banding on the left.  A lens extraction has been performed on the right.  Mild mucosal thickening is noted in the anterior ethmoid air cells and maxillary sinuses.  The paranasal sinuses are otherwise clear.  There is some fluid in the mastoid air cells.  No obstructing nasopharyngeal lesion is evident.  IMPRESSION:  1.  Acute non hemorrhagic infarct of the posterior right corona radiata. 2.  Occluded left internal carotid artery, a chronic finding. 3.  Moderate atrophy and extensive white matter disease is otherwise stable.  MRA HEAD  Findings: The left internal carotid artery is chronically occluded. It is reconstituted at the level of the  posterior communicating artery.  The right internal carotid artery is within normal limits. The right posterior communicating artery is patent.  The anterior communicating artery is patent.  The ACA branch vessels are within normal limits.  The MCA bifurcations are within normal limits bilaterally.  There is some tapering of distal MCA branch vessels, more apparent on the left.  The left vertebral artery is the dominant vessel.  The PICA origins are visualized and within normal limits bilaterally.  A prominent left AICA is also noted.  The basilar artery is within normal limits.  The left posterior cerebral artery originates from basilar tip. The right posterior cerebral artery is predominately of fetal type with a small right P1 segment.  There is some attenuation of distal PCA branch vessels, more prominent on the left.  IMPRESSION:  1.  Occluded left internal carotid artery is reconstituted at the level of the posterior communicating artery. 2.  Moderate small vessel attenuation anteriorly and posteriorly is worse on the left.  This is likely a flow phenomenon anteriorly.  Original Report Authenticated By: Jamesetta Orleans. MATTERN, M.D.     1. Stroke   2. Carotid artery disease       MDM  The patient appears reasonably stabilized for admission considering the current resources, flow, and capabilities available in the ED at this time, and I doubt any other [facility] requiring further screening and/or treatment in the ED prior to admission to [facility] after Neuro saw Pt in ED.        Hurman Horn, MD 08/18/11 [phone]

## 2011-08-16 NOTE — Consult Note (Signed)
Chief Complaint: Left facial droop, hemiparesis and dysarthria  HPI: Dylan Morgan is an 66 y.o. male history on the right CVA in September 2012, paroxysmal atrial fibrillation on anticoagulation, coronary artery disease, hypertension, hyperlipidemia and known left internal carotid occlusion presenting with new onset left face arm and leg weakness and numbness as well as slurred speech with onset at 8:20 PM today. CT scan of his head showed no acute intracranial abnormality. Small vessel white matter changes were noted. INR was 2.43. Patient's deficits resolved rapidly after arriving in the emergency room. He was not considered a candidate for TPA because of the rapid resolution of his symptoms as well as therapeutic INR on Coumadin. She was admitted for workup to rule out recurrent subcortical stroke versus TIA.  LSN: A 20 p.m. today tPA Given: No: Rapidly resolution of symptoms and therapeutic INR on Coumadin. MRankin: 0  Past Medical History  Diagnosis Date  . CAD (coronary artery disease) CABG in 2004    F/U by Dr Genia Harold cardiology, cardiolite scan done elsewhere in October 2010 that was normal, no ischemia seen, EF 55-65% in 02/2005  . GERD (gastroesophageal reflux disease)   . Hypertension   . Hyperlipidemia   . Renal insufficiency   . Cellulitis and abscess of leg     right leg  . Tobacco abuse   . Chronic back pain   . Chronic neck pain   . Seborrheic dermatitis of scalp   . Abnormal LFTs     Hx of abnormal LFT's  . Bradycardia   . AAA (abdominal aortic aneurysm)     November 2010, Stenting, November, 2010  . Paroxysmal atrial fibrillation 02/22/2009    diltiazem po started but discontinued due to side effects.  . Stroke   . Ejection fraction     EF 40% in the past  /   EF 55-65% echo, November, 2006  . Hx of CABG     2004  . Carotid artery disease     Doppler, October, 2012, and old LICA occlusion , RICA no significant abnormality    Family History  Problem  Relation Age of Onset  . Diabetes Mother   . Stroke Father   . Cancer Sister     thyroid cancer  . Heart disease Brother     Coronary artery disease     Medications: Prior to Admission:  Lisinopril 10 mg per day Multivitamin 1 per day Viagra 100 mg when necessary Zocor 10 mg at bedtime Vitamin E 1000 units per day Coumadin 5 mg per day Tylenol 325 mg 2 tablets every 4 hours when necessary pain  Physical Examination: Blood pressure 127/74, pulse 59, temperature 98.4 F (36.9 C), temperature source Oral, resp. rate 18, SpO2 99.00%.  Neurologic Examination: Mental Status: Alert, oriented, thought content appropriate.  Speech fluent without evidence of aphasia. Able to follow commands without difficulty. Cranial Nerves: II-Visual fields were normal. III/IV/VI-Pupils were equal and reacted. Extraocular movements were full and conjugate.    V/VII-no facial numbness and no facial weakness. VIII-normal. X-normal speech consistent with edentulous state. XII-midline tongue extension Motor: 5/5 bilaterally with normal tone and bulk Sensory: Normal throughout. Deep Tendon Reflexes: 2+ and symmetric. Plantars: Flexor bilaterally Cerebellar: Normal finger-to-nose and heel-to-shin testing. Carotid auscultation: Normal   Ct Head Wo Contrast  08/16/2011  *RADIOLOGY REPORT*  Clinical Data: Code stroke.  Slurred speech with left arm weakness and left facial droop.  Left leg weakness.  CT HEAD WITHOUT CONTRAST  Technique:  Contiguous  axial images were obtained from the base of the skull through the vertex without contrast.  Comparison: 01/24/2011.  Findings: No evidence of acute infarct, acute hemorrhage, mass lesion, mass effect or hydrocephalus.  There is patchy low attenuation in the periventricular white matter.  Mild atrophy. Visualized portions of the paranasal sinuses and mastoid air cells are clear.  IMPRESSION:  1.  No acute intracranial abnormality. These results were called by  telephone on 08/16/2011  at  2119 hours to  Dr. Fonnie Jarvis, who verbally acknowledged these results. 2.  Atrophy and chronic microvascular white matter ischemic changes.  Original Report Authenticated By: Reyes Ivan, M.D.    Assessment: 66 y.o. male with probable transient ischemic attack. However, recurrent right subcortical ischemic stroke cannot be ruled out. 2. Atrial fibrillation, on therapeutic anticoagulation with Coumadin.  Stroke Risk Factors - atrial fibrillation, carotid stenosis, hyperlipidemia and hypertension  Plan: 1. HgbA1c, fasting lipid panel 2. MRI, MRA  of the brain without contrast 3. Carotid dopplers 4. Prophylactic therapy-Anticoagulation: Coumadin 5. Risk factor modification 6. Telemetry monitoring  C.R. Roseanne Reno, MD [facility] [phone]  08/16/2011, 9:54 PM

## 2011-08-16 NOTE — ED Notes (Signed)
Per EMS pt felt his L arm go numb. Pt was last seen by family at  820pm. BP 148/74. CBG 70. A/Ox4. Pt has weakness to L arm, L leg, slurred speech and L facial droop.

## 2011-08-17 ENCOUNTER — Encounter (HOSPITAL_COMMUNITY): Payer: Self-pay | Admitting: General Practice

## 2011-08-17 ENCOUNTER — Inpatient Hospital Stay (HOSPITAL_COMMUNITY): Payer: Medicare Other

## 2011-08-17 DIAGNOSIS — I6340 Cerebral infarction due to embolism of unspecified cerebral artery: Secondary | ICD-10-CM

## 2011-08-17 DIAGNOSIS — I6350 Cerebral infarction due to unspecified occlusion or stenosis of unspecified cerebral artery: Principal | ICD-10-CM

## 2011-08-17 DIAGNOSIS — I4891 Unspecified atrial fibrillation: Secondary | ICD-10-CM

## 2011-08-17 DIAGNOSIS — G8190 Hemiplegia, unspecified affecting unspecified side: Secondary | ICD-10-CM

## 2011-08-17 LAB — HEPATIC FUNCTION PANEL
Alkaline Phosphatase: 77 U/L (ref 39–117)
Total Protein: 7.2 g/dL (ref 6.0–8.3)

## 2011-08-17 LAB — CBC
MCH: 30.6 pg (ref 26.0–34.0)
Platelets: 123 10*3/uL — ABNORMAL LOW (ref 150–400)
RBC: 4.15 MIL/uL — ABNORMAL LOW (ref 4.22–5.81)

## 2011-08-17 LAB — HEMOGLOBIN A1C: Hgb A1c MFr Bld: 6 % — ABNORMAL HIGH (ref <5.7)

## 2011-08-17 LAB — LIPID PANEL
Cholesterol: 122 mg/dL (ref 0–200)
HDL: 37 mg/dL — ABNORMAL LOW (ref >39)
Total CHOL/HDL Ratio: 3.3 RATIO
Triglycerides: 211 mg/dL — ABNORMAL HIGH (ref <150)
VLDL: 42 mg/dL — ABNORMAL HIGH (ref 0–40)

## 2011-08-17 LAB — BASIC METABOLIC PANEL
Chloride: 104 mEq/L (ref 96–112)
Creatinine, Ser: 1.94 mg/dL — ABNORMAL HIGH (ref 0.50–1.35)
GFR calc Af Amer: 39 mL/min — ABNORMAL LOW (ref >90)
Potassium: 4.2 mEq/L (ref 3.5–5.1)
Sodium: 137 mEq/L (ref 135–145)

## 2011-08-17 LAB — MAGNESIUM: Magnesium: 1.8 mg/dL (ref 1.5–2.5)

## 2011-08-17 LAB — CARDIAC PANEL(CRET KIN+CKTOT+MB+TROPI)
CK, MB: 6 ng/mL — ABNORMAL HIGH (ref 0.3–4.0)
Total CK: 348 U/L — ABNORMAL HIGH (ref 7–232)

## 2011-08-17 LAB — PROTIME-INR: Prothrombin Time: 28.8 seconds — ABNORMAL HIGH (ref 11.6–15.2)

## 2011-08-17 MED ORDER — SODIUM CHLORIDE 0.9 % IV SOLN
INTRAVENOUS | Status: DC
  Administered 2011-08-17 – 2011-08-18 (×3): via INTRAVENOUS

## 2011-08-17 MED ORDER — ACETAMINOPHEN 325 MG PO TABS
650.0000 mg | ORAL_TABLET | Freq: Four times a day (QID) | ORAL | Status: DC | PRN
  Administered 2011-08-17 – 2011-08-18 (×2): 650 mg via ORAL
  Filled 2011-08-17 (×2): qty 2

## 2011-08-17 MED ORDER — LISINOPRIL 10 MG PO TABS
10.0000 mg | ORAL_TABLET | Freq: Every day | ORAL | Status: DC
  Administered 2011-08-17 – 2011-08-18 (×2): 10 mg via ORAL
  Filled 2011-08-17 (×3): qty 1

## 2011-08-17 MED ORDER — ONDANSETRON HCL 4 MG/2ML IJ SOLN: 4.0000 mg | Freq: Four times a day (QID) | INTRAMUSCULAR | Status: DC | PRN

## 2011-08-17 MED ORDER — WARFARIN - PHARMACIST DOSING INPATIENT: Freq: Every day | Status: DC

## 2011-08-17 MED ORDER — WARFARIN SODIUM 5 MG PO TABS
5.0000 mg | ORAL_TABLET | Freq: Every day | ORAL | Status: DC
  Administered 2011-08-17: 5 mg via ORAL
  Filled 2011-08-17 (×2): qty 1

## 2011-08-17 MED ORDER — SIMVASTATIN 10 MG PO TABS
10.0000 mg | ORAL_TABLET | Freq: Every day | ORAL | Status: DC
  Administered 2011-08-17: 10 mg via ORAL
  Filled 2011-08-17 (×2): qty 1

## 2011-08-17 MED ORDER — ASPIRIN EC 81 MG PO TBEC
81.0000 mg | DELAYED_RELEASE_TABLET | Freq: Every day | ORAL | Status: DC
  Administered 2011-08-17: 81 mg via ORAL
  Filled 2011-08-17: qty 1

## 2011-08-17 MED ORDER — ONDANSETRON HCL 4 MG PO TABS: 4.0000 mg | ORAL_TABLET | Freq: Four times a day (QID) | ORAL | Status: DC | PRN

## 2011-08-17 NOTE — Code Documentation (Signed)
66 yo male arrived via EMS after noting left sided weakness/numbness, facial droop and slurred speech at 2020. Code stroke called at 24, patient arrived at 2054, stroke team arrived at 2058, patient LSN at 2020, patient arrived in CT at 2056, phlebotomy arrived at 2054, CT read by Dr. Roseanne Reno at 2108. Symptoms resolving, initial NIH 03, patient on coumadin for Afib, INR 2.43 Code stroke cancelled at 2120 by Dr. Roseanne Reno.

## 2011-08-17 NOTE — Progress Notes (Signed)
Subjective: No events overnight.  This a.m. MRI returned positive for stroke of the R corona radiata Objective: Vital signs in last 24 hours: Filed Vitals:   08/17/11 0146 08/17/11 0403 08/17/11 0600 08/17/11 1049  BP:  103/71 129/65 111/61  Pulse:  54 55 57  Temp: 97.9 F (36.6 C) 97.1 F (36.2 C) 97.6 F (36.4 C) 97.9 F (36.6 C)  TempSrc:    Oral  Resp:  22 20 20   Height:      Weight:      SpO2:  97% 98% 95%   Weight change:   Intake/Output Summary (Last 24 hours) at 08/17/11 1148 Last data filed at 08/17/11 0900  Gross per 24 hour  Intake    243 ml  Output    200 ml  Net     43 ml   GEN: NAD.  Alert and oriented x 3. Sleepy. RESP:  CTAB, no w/r/r CARDIOVASCULAR: RRR ABDOMEN: soft, NT/ND, NABS EXT: warm and dry. No edema in b/l LE Neruo: limited exam 2/2 pt compliance, but 5/5 strength in bl/ finger extensors/flexors and bicep/tricep.  Some numbness of R hand subjectively.  Lab Results: Basic Metabolic Panel:  Lab 08/17/11 1610 08/17/11 0218 08/16/11 2120 08/16/11 2059  NA 137 -- 141 --  K 4.2 -- 4.4 --  CL 104 -- 109 --  CO2 19 -- -- 21  GLUCOSE 113* -- 110* --  BUN 46* -- 46* --  CREATININE 1.94* -- 2.70* --  CALCIUM 9.4 -- -- 9.8  MG -- 1.8 -- --  PHOS -- -- -- --   Liver Function Tests:  Lab 08/17/11 0218 08/16/11 2059  AST 34 35  ALT 22 24  ALKPHOS 77 82  BILITOT 0.4 0.3  PROT 7.2 7.6  ALBUMIN 3.7 4.0   CBC:  Lab 08/17/11 0630 08/16/11 2120 08/16/11 2059  WBC 4.2 -- 5.8  NEUTROABS -- -- 3.2  HGB 12.7* 13.3 --  HCT 37.3* 39.0 --  MCV 89.9 -- 88.7  PLT 123* -- 139*   Cardiac Enzymes:  Lab 08/17/11 0850 08/17/11 0218 08/16/11 2059  CKTOTAL 318* 348* 404*  CKMB 5.8* 6.0* 6.7*  CKMBINDEX -- -- --  TROPONINI <0.30 <0.30 <0.30   CBG:  Lab 08/16/11 2101  GLUCAP 110*   Hemoglobin A1C: No results found for this basename: HGBA1C in the last 168 hours Fasting Lipid Panel:  Lab 08/17/11 0630  CHOL 122  HDL 37*  LDLCALC 43  TRIG  211*  CHOLHDL 3.3  LDLDIRECT --   Coagulation:  Lab 08/17/11 0630 08/16/11 2059 08/10/11 1612  LABPROT 28.8* 26.8* --  INR 2.66* 2.43* 2.4   Urine Drug Screen: Drugs of Abuse     Component Value Date/Time   LABOPIA NONE DETECTED 08/16/2011 2137   COCAINSCRNUR NONE DETECTED 08/16/2011 2137   LABBENZ NONE DETECTED 08/16/2011 2137   AMPHETMU NONE DETECTED 08/16/2011 2137   THCU NONE DETECTED 08/16/2011 2137   LABBARB NONE DETECTED 08/16/2011 2137    Alcohol Level: No results found for this basename: ETH:2 in the last 168 hours Urinalysis: No results found for this basename: COLORURINE:2,APPERANCEUR:2,LABSPEC:2,PHURINE:2,GLUCOSEU:2,HGBUR:2,BILIRUBINUR:2,KETONESUR:2,PROTEINUR:2,UROBILINOGEN:2,NITRITE:2,LEUKOCYTESUR:2 in the last 168 hours  Micro Results: No results found for this or any previous visit (from the past 240 hour(s)). Studies/Results: Ct Head Wo Contrast  08/16/2011  *RADIOLOGY REPORT*  Clinical Data: Code stroke.  Slurred speech with left arm weakness and left facial droop.  Left leg weakness.  CT HEAD WITHOUT CONTRAST  Technique:  Contiguous axial images were obtained  from the base of the skull through the vertex without contrast.  Comparison: 01/24/2011.  Findings: No evidence of acute infarct, acute hemorrhage, mass lesion, mass effect or hydrocephalus.  There is patchy low attenuation in the periventricular white matter.  Mild atrophy. Visualized portions of the paranasal sinuses and mastoid air cells are clear.  IMPRESSION:  1.  No acute intracranial abnormality. These results were called by telephone on 08/16/2011  at  2119 hours to  Dr. Fonnie Jarvis, who verbally acknowledged these results. 2.  Atrophy and chronic microvascular white matter ischemic changes.  Original Report Authenticated By: Reyes Ivan, M.D.   Mri Brain Without Contrast  08/17/2011  *RADIOLOGY REPORT*  Clinical Data:  TIA.  Left-sided facial weakness and facial droop. Slurred speech.  MRI HEAD WITHOUT  CONTRAST MRA HEAD WITHOUT CONTRAST  Technique:  Multiplanar, multiecho pulse sequences of the brain and surrounding structures were obtained without intravenous contrast. Angiographic images of the head were obtained using MRA technique without contrast.  Comparison:  CT head 08/16/2011.  MRI brain 01/01/2011.  MRI HEAD  Findings:  Focal restricted diffusion is evident in the posterior right carinae corona radiata.  T2 and FLAIR signal is associated.  No hemorrhage or mass lesion is present.  Atrophy and moderate white matter disease is otherwise stable.  The left internal carotid artery is occluded below the posterior communicating artery.  Flow is present in the right internal carotid artery and vertebral arteries bilaterally.  The patient is status post scleral banding on the left.  A lens extraction has been performed on the right.  Mild mucosal thickening is noted in the anterior ethmoid air cells and maxillary sinuses.  The paranasal sinuses are otherwise clear.  There is some fluid in the mastoid air cells.  No obstructing nasopharyngeal lesion is evident.  IMPRESSION:  1.  Acute non hemorrhagic infarct of the posterior right corona radiata. 2.  Occluded left internal carotid artery, a chronic finding. 3.  Moderate atrophy and extensive white matter disease is otherwise stable.  MRA HEAD  Findings: The left internal carotid artery is chronically occluded. It is reconstituted at the level of the posterior communicating artery.  The right internal carotid artery is within normal limits. The right posterior communicating artery is patent.  The anterior communicating artery is patent.  The ACA branch vessels are within normal limits.  The MCA bifurcations are within normal limits bilaterally.  There is some tapering of distal MCA branch vessels, more apparent on the left.  The left vertebral artery is the dominant vessel.  The PICA origins are visualized and within normal limits bilaterally.  A prominent left  AICA is also noted.  The basilar artery is within normal limits.  The left posterior cerebral artery originates from basilar tip. The right posterior cerebral artery is predominately of fetal type with a small right P1 segment.  There is some attenuation of distal PCA branch vessels, more prominent on the left.  IMPRESSION:  1.  Occluded left internal carotid artery is reconstituted at the level of the posterior communicating artery. 2.  Moderate small vessel attenuation anteriorly and posteriorly is worse on the left.  This is likely a flow phenomenon anteriorly.  Original Report Authenticated By: Jamesetta Orleans. MATTERN, M.D.   Mr Maxine Glenn Head/brain Wo Cm  08/17/2011  *RADIOLOGY REPORT*  Clinical Data:  TIA.  Left-sided facial weakness and facial droop. Slurred speech.  MRI HEAD WITHOUT CONTRAST MRA HEAD WITHOUT CONTRAST  Technique:  Multiplanar, multiecho pulse sequences of  the brain and surrounding structures were obtained without intravenous contrast. Angiographic images of the head were obtained using MRA technique without contrast.  Comparison:  CT head 08/16/2011.  MRI brain 01/01/2011.  MRI HEAD  Findings:  Focal restricted diffusion is evident in the posterior right carinae corona radiata.  T2 and FLAIR signal is associated.  No hemorrhage or mass lesion is present.  Atrophy and moderate white matter disease is otherwise stable.  The left internal carotid artery is occluded below the posterior communicating artery.  Flow is present in the right internal carotid artery and vertebral arteries bilaterally.  The patient is status post scleral banding on the left.  A lens extraction has been performed on the right.  Mild mucosal thickening is noted in the anterior ethmoid air cells and maxillary sinuses.  The paranasal sinuses are otherwise clear.  There is some fluid in the mastoid air cells.  No obstructing nasopharyngeal lesion is evident.  IMPRESSION:  1.  Acute non hemorrhagic infarct of the posterior right  corona radiata. 2.  Occluded left internal carotid artery, a chronic finding. 3.  Moderate atrophy and extensive white matter disease is otherwise stable.  MRA HEAD  Findings: The left internal carotid artery is chronically occluded. It is reconstituted at the level of the posterior communicating artery.  The right internal carotid artery is within normal limits. The right posterior communicating artery is patent.  The anterior communicating artery is patent.  The ACA branch vessels are within normal limits.  The MCA bifurcations are within normal limits bilaterally.  There is some tapering of distal MCA branch vessels, more apparent on the left.  The left vertebral artery is the dominant vessel.  The PICA origins are visualized and within normal limits bilaterally.  A prominent left AICA is also noted.  The basilar artery is within normal limits.  The left posterior cerebral artery originates from basilar tip. The right posterior cerebral artery is predominately of fetal type with a small right P1 segment.  There is some attenuation of distal PCA branch vessels, more prominent on the left.  IMPRESSION:  1.  Occluded left internal carotid artery is reconstituted at the level of the posterior communicating artery. 2.  Moderate small vessel attenuation anteriorly and posteriorly is worse on the left.  This is likely a flow phenomenon anteriorly.  Original Report Authenticated By: Jamesetta Orleans. MATTERN, M.D.   Medications: I have reviewed the patient's current medications. Scheduled Meds:   . aspirin EC  81 mg Oral Daily  . lisinopril  10 mg Oral Daily  . simvastatin  10 mg Oral QHS  . sodium chloride  3 mL Intravenous STAT  . warfarin  5 mg Oral q1800  . Warfarin - Pharmacist Dosing Inpatient   Does not apply q1800   Continuous Infusions:   . sodium chloride 125 mL/hr at 08/17/11 0515   PRN Meds:.acetaminophen, ondansetron (ZOFRAN) IV, ondansetron  Assessment/Plan: # R corona radiata stroke MRI  shows acute non hemorrhagic infarct.  Exam does not show focal weakness but pt still has subjective numbness in L hand.  MRI shows con't stenosis of L ICA.  Doppler still pending for eval of R side.  Per neuro rec's, con't coumadin and await U/S results. - con't coumadin - no ASA per neuro - appreciate neuro rec's - f/u carotid doppler - possible clinical trial placement per neuro - a1c pending - PT/OT consults   # HTN Controlled on ACE. - con't lisinopril 10mg  qd  # Acute  on chronic CKD Baseline Cr is 1.3-1.5, now 1.9 down from 2.7.  - UA pending - con't IVF - bmet in a.m.  # Hyperlipidemia Well controlled with LDL of 43.  TG is 211, but is improved from 297. Last LDL on 68, total chol 160, HDL 33, except forTriglyceride 297 in 12/2010. He is currently taking Simvastatin 10mg  as outpatient. Of note, patient reports severe cramping of his legs, which could represent statin myopathy vs claudication.  However, lipid control is critical in this vasculopathic pt - con't statin - consider fibrate for TG level   # Hx CAD: s/p CABG in 2004. Stable, denies any chest pain at this time. Patient is not on BB due to his bradycardia. Per Dr. Henrietta Hoover note, patient may need pacemaker in the future.   # Chronic back pain: s/p mechanical fall in the past. Patient has been managed with Tylenol as outpatient and avoid NSAIDs due to his CKD. Will continue Tylenol for now and if not well controlled, may add stronger pain med   # DVT ppx: Warfarin for paroxysmal afib  # Dispo: PCP Margorie John   LOS: 1 day   WILDMAN-TOBRINER, BEN 08/17/2011, 11:48 AM

## 2011-08-17 NOTE — Progress Notes (Signed)
*  PRELIMINARY RESULTS* Vascular Ultrasound Carotid Duplex (Doppler) has been completed. There is evidence of right internal carotid artery stenosis in the 40-59% range. The left internal carotid artery is occluded. The right vertebral artery demonstrates atypical flow, left vertebral artery has antegrade flow.   Malachy Moan, RDMS, RDCS 08/17/2011, 5:17 PM

## 2011-08-17 NOTE — Care Management Note (Signed)
    Page 1 of 1   08/18/2011     2:55:56 PM   CARE MANAGEMENT NOTE 08/18/2011  Patient:  Dylan Morgan, Dylan Morgan   Account Number:  [id number]  Date Initiated:  08/17/2011  Documentation initiated by:  Onnie Boer  Subjective/Objective Assessment:   PT WAS ADMITTED [facility] CVA     Action/Plan:   PROGRESSION OF CARE AND DISCHARGE PLANNING   Anticipated DC Date:  08/18/2011   Anticipated DC Plan:  HOME W HOME HEALTH SERVICES      DC Planning Services  CM consult      Choice offered to / List presented to:  C-1 Patient        HH arranged  HH-2 PT  HH-3 OT      [facility] agency  [facility]   Status of service:  Completed, signed off Medicare Important Message given?   (If response is "NO", the following Medicare IM given date fields will be blank) Date Medicare IM given:   Date Additional Medicare IM given:    Discharge Disposition:  HOME W HOME HEALTH SERVICES  Per UR Regulation:  Reviewed for med. necessity/level of care/duration of stay  If discussed at Long Length of Stay Meetings, dates discussed:    Comments:  09/18/11 Spoke with patient and his wife about HHC for HHPT and HHOT. They chose [facility] from the [facility] agencies list. Ginnie Smart at [facility] and requested HHPT and HHOT. Jacquelynn Cree RN, BSN, CCM  08/17/11 Onnie Boer, RN, BSN 1125 PT WAS ADMITTED WITH CVA, PTA PT WAS AT HOME WITH SELF CARE. WILL F/U ON DC NEEDS AND PT/OT EVAL.

## 2011-08-17 NOTE — ED Notes (Signed)
[place], RN receiving pt states she will perform stroke swallow screen on pt arrival to unit.

## 2011-08-17 NOTE — H&P (Signed)
Internal Medicine Attending Admission Note Date: 08/17/2011  Patient name: Dylan Morgan Medical record number: [id number] Date of birth: 1945-07-29 Age: 66 y.o. Gender: male  I saw and evaluated the patient. I reviewed the resident's note and I agree with the resident's findings and plan as documented in the resident's note.  Chief Complaint(s):  Left arm and leg numbness and weakness.  History - key components related to admission:  Dylan Morgan is a 66 year old man with a history of CVA in September 2012, paroxysmal atrial fibrillation, left internal carotid occlusion, coronary artery disease status post bypass grafting, abdominal aortic aneurysm status post stenting, and chronic kidney disease who presents with the acute onset of left arm weakness and numbness that progressed to include left leg weakness and numbness. He was in his usual state of health until last evening when in bed he noted that his left arm began to tingle. This was followed by weakness of the arm. He got up out of bed and began having trouble walking related to left leg weakness and numbness. He asked his friend to call 911 and was taken to the emergency department. Soon after arrival much of his symptoms had resolved. He is left with subjective weakness of the left leg. He currently is without any other complaints.  Physical Exam - key components related to admission:  Filed Vitals:   08/17/11 0600 08/17/11 1049 08/17/11 1747 08/17/11 1814  BP: 129/65 111/61 121/61 129/61  Pulse: 55 57 67 97  Temp: 97.6 F (36.4 C) 97.9 F (36.6 C) 98 F (36.7 C) 98 F (36.7 C)  TempSrc:  Oral Oral Oral  Resp: 20 20 20 20   Height:      Weight:      SpO2: 98% 95% 98% 98%   General: Well-developed well-nourished man lying comfortably in bed in no acute distress. Neck: Right carotid bruit, no left carotid bruit. Lungs: Clear to auscultation without wheezes rhonchi or rales. Heart: Regular rate and rhythm without murmurs, rubs, or  gallops. Abdomen: Soft, nontender, active bowel sounds. Extremities: Without edema. Neuro: Alert and oriented x3. Cranial nerves II through XII intact except for a left medial visual defect. Strength is 5 over 5 in both the upper and lower strategies. Sensation to light touch is intact throughout.  Lab results:  Basic Metabolic Panel:  Basename 08/17/11 0630 08/17/11 0218 08/16/11 2120 08/16/11 2059  NA 137 -- 141 --  K 4.2 -- 4.4 --  CL 104 -- 109 --  CO2 19 -- -- 21  GLUCOSE 113* -- 110* --  BUN 46* -- 46* --  CREATININE 1.94* -- 2.70* --  CALCIUM 9.4 -- -- 9.8  MG -- 1.8 -- --  PHOS -- -- -- --   Liver Function Tests:  [facility] 08/17/11 0218 08/16/11 2059  AST 34 35  ALT 22 24  ALKPHOS 77 82  BILITOT 0.4 0.3  PROT 7.2 7.6  ALBUMIN 3.7 4.0   CBC:  Basename 08/17/11 0630 08/16/11 2120 08/16/11 2059  WBC 4.2 -- 5.8  NEUTROABS -- -- 3.2  HGB 12.7* 13.3 --  HCT 37.3* 39.0 --  MCV 89.9 -- 88.7  PLT 123* -- 139*   Cardiac Enzymes:  Basename 08/17/11 0850 08/17/11 0218 08/16/11 2059  CKTOTAL 318* 348* 404*  CKMB 5.8* 6.0* 6.7*  CKMBINDEX -- -- --  TROPONINI <0.30 <0.30 <0.30   CBG:  Basename 08/16/11 2101  GLUCAP 110*   Hemoglobin A1C:  Basename 08/17/11 0218  HGBA1C 6.0*   Fasting  Lipid Panel:  Basename 08/17/11 0630  CHOL 122  HDL 37*  LDLCALC 43  TRIG 540*  CHOLHDL 3.3  LDLDIRECT --   Coagulation:  Basename 08/17/11 0630 08/16/11 2059  INR 2.66* 2.43*   Imaging results:  Ct Head Wo Contrast  08/16/2011  *RADIOLOGY REPORT*  Clinical Data: Code stroke.  Slurred speech with left arm weakness and left facial droop.  Left leg weakness.  CT HEAD WITHOUT CONTRAST  Technique:  Contiguous axial images were obtained from the base of the skull through the vertex without contrast.  Comparison: 01/24/2011.  Findings: No evidence of acute infarct, acute hemorrhage, mass lesion, mass effect or hydrocephalus.  There is patchy low attenuation in the  periventricular white matter.  Mild atrophy. Visualized portions of the paranasal sinuses and mastoid air cells are clear.  IMPRESSION:  1.  No acute intracranial abnormality. These results were called by telephone on 08/16/2011  at  2119 hours to  Dr. Fonnie Jarvis, who verbally acknowledged these results. 2.  Atrophy and chronic microvascular white matter ischemic changes.  Original Report Authenticated By: Reyes Ivan, M.D.   Mri Brain Without Contrast  08/17/2011  *RADIOLOGY REPORT*  Clinical Data:  TIA.  Left-sided facial weakness and facial droop. Slurred speech.  MRI HEAD WITHOUT CONTRAST MRA HEAD WITHOUT CONTRAST  Technique:  Multiplanar, multiecho pulse sequences of the brain and surrounding structures were obtained without intravenous contrast. Angiographic images of the head were obtained using MRA technique without contrast.  Comparison:  CT head 08/16/2011.  MRI brain 01/01/2011.  MRI HEAD  Findings:  Focal restricted diffusion is evident in the posterior right carinae corona radiata.  T2 and FLAIR signal is associated.  No hemorrhage or mass lesion is present.  Atrophy and moderate white matter disease is otherwise stable.  The left internal carotid artery is occluded below the posterior communicating artery.  Flow is present in the right internal carotid artery and vertebral arteries bilaterally.  The patient is status post scleral banding on the left.  A lens extraction has been performed on the right.  Mild mucosal thickening is noted in the anterior ethmoid air cells and maxillary sinuses.  The paranasal sinuses are otherwise clear.  There is some fluid in the mastoid air cells.  No obstructing nasopharyngeal lesion is evident.  IMPRESSION:  1.  Acute non hemorrhagic infarct of the posterior right corona radiata. 2.  Occluded left internal carotid artery, a chronic finding. 3.  Moderate atrophy and extensive white matter disease is otherwise stable.  MRA HEAD  Findings: The left internal carotid  artery is chronically occluded. It is reconstituted at the level of the posterior communicating artery.  The right internal carotid artery is within normal limits. The right posterior communicating artery is patent.  The anterior communicating artery is patent.  The ACA branch vessels are within normal limits.  The MCA bifurcations are within normal limits bilaterally.  There is some tapering of distal MCA branch vessels, more apparent on the left.  The left vertebral artery is the dominant vessel.  The PICA origins are visualized and within normal limits bilaterally.  A prominent left AICA is also noted.  The basilar artery is within normal limits.  The left posterior cerebral artery originates from basilar tip. The right posterior cerebral artery is predominately of fetal type with a small right P1 segment.  There is some attenuation of distal PCA branch vessels, more prominent on the left.  IMPRESSION:  1.  Occluded left internal carotid artery  is reconstituted at the level of the posterior communicating artery. 2.  Moderate small vessel attenuation anteriorly and posteriorly is worse on the left.  This is likely a flow phenomenon anteriorly.  Original Report Authenticated By: Jamesetta Orleans. MATTERN, M.D.   Mr Maxine Glenn Head/brain Wo Cm  08/17/2011  *RADIOLOGY REPORT*  Clinical Data:  TIA.  Left-sided facial weakness and facial droop. Slurred speech.  MRI HEAD WITHOUT CONTRAST MRA HEAD WITHOUT CONTRAST  Technique:  Multiplanar, multiecho pulse sequences of the brain and surrounding structures were obtained without intravenous contrast. Angiographic images of the head were obtained using MRA technique without contrast.  Comparison:  CT head 08/16/2011.  MRI brain 01/01/2011.  MRI HEAD  Findings:  Focal restricted diffusion is evident in the posterior right carinae corona radiata.  T2 and FLAIR signal is associated.  No hemorrhage or mass lesion is present.  Atrophy and moderate white matter disease is otherwise  stable.  The left internal carotid artery is occluded below the posterior communicating artery.  Flow is present in the right internal carotid artery and vertebral arteries bilaterally.  The patient is status post scleral banding on the left.  A lens extraction has been performed on the right.  Mild mucosal thickening is noted in the anterior ethmoid air cells and maxillary sinuses.  The paranasal sinuses are otherwise clear.  There is some fluid in the mastoid air cells.  No obstructing nasopharyngeal lesion is evident.  IMPRESSION:  1.  Acute non hemorrhagic infarct of the posterior right corona radiata. 2.  Occluded left internal carotid artery, a chronic finding. 3.  Moderate atrophy and extensive white matter disease is otherwise stable.  MRA HEAD  Findings: The left internal carotid artery is chronically occluded. It is reconstituted at the level of the posterior communicating artery.  The right internal carotid artery is within normal limits. The right posterior communicating artery is patent.  The anterior communicating artery is patent.  The ACA branch vessels are within normal limits.  The MCA bifurcations are within normal limits bilaterally.  There is some tapering of distal MCA branch vessels, more apparent on the left.  The left vertebral artery is the dominant vessel.  The PICA origins are visualized and within normal limits bilaterally.  A prominent left AICA is also noted.  The basilar artery is within normal limits.  The left posterior cerebral artery originates from basilar tip. The right posterior cerebral artery is predominately of fetal type with a small right P1 segment.  There is some attenuation of distal PCA branch vessels, more prominent on the left.  IMPRESSION:  1.  Occluded left internal carotid artery is reconstituted at the level of the posterior communicating artery. 2.  Moderate small vessel attenuation anteriorly and posteriorly is worse on the left.  This is likely a flow  phenomenon anteriorly.  Original Report Authenticated By: Jamesetta Orleans. MATTERN, M.D.   Other results:  EKG: Normal sinus rhythm at 65 beats per minute, left axis deviation, left anterior hemiblock, T wave inversions in I, aVL, and V1-V2. This EKG is unchanged from the one obtained in February 2013.  Assessment & Plan by Problem:  Dylan Morgan is a 66 year old man with a long history of vasculopathy. He presents with a new acute right CVA manifested by left arm and leg numbness and weakness. Clinically his symptoms have resolved.  1) Right CVA: Workup thus far has not revealed any new information than was previously known with the exception of the acute CVA seen on  MRI. We will continue the Coumadin. Neurology is following and will make recommendations.

## 2011-08-17 NOTE — Progress Notes (Signed)
Stroke Team Progress Note  HISTORY Dylan Morgan is an 66 y.o. male history on the right CVA in September 2012, paroxysmal atrial fibrillation on anticoagulation, coronary artery disease, hypertension, hyperlipidemia and known left internal carotid occlusion presenting with new onset left face arm and leg weakness and numbness as well as slurred speech with onset at 8:20 PM 08/16/2011. CT scan of his head showed no acute intracranial abnormality. Small vessel white matter changes were noted. INR was 2.43. Patient's deficits resolved rapidly after arriving in the emergency room. He was not considered a candidate for TPA because of the rapid resolution of his symptoms as well as therapeutic INR on Coumadin. She was admitted for workup to rule out recurrent subcortical stroke versus TIA.  SUBJECTIVE No family is at the bedside.  Overall he feels his condition is gradually improving.   OBJECTIVE Most recent Vital Signs: Filed Vitals:   08/17/11 0138 08/17/11 0146 08/17/11 0403 08/17/11 0600  BP: 128/72  103/71 129/65  Pulse: 59  54 55  Temp: 97.9 F (36.6 C) 97.9 F (36.6 C) 97.1 F (36.2 C) 97.6 F (36.4 C)  TempSrc:      Resp: 20  22 20   Height: 5\' 11"  (1.803 m)     Weight: 108.8 kg (239 lb 13.8 oz)     SpO2: 97%  97% 98%   CBG (last 3)  Basename 08/16/11 2101  GLUCAP 110*   Intake/Output from previous day: 05/15 0701 - 05/16 0700 In: 3 [I.V.:3] Out: -   IV Fluid Intake:     . sodium chloride 125 mL/hr at 08/17/11 0515   MEDICATIONS    . aspirin EC  81 mg Oral Daily  . lisinopril  10 mg Oral Daily  . simvastatin  10 mg Oral QHS  . sodium chloride  3 mL Intravenous STAT  . warfarin  5 mg Oral q1800  . Warfarin - Pharmacist Dosing Inpatient   Does not apply q1800   PRN:  acetaminophen, ondansetron (ZOFRAN) IV, ondansetron  Diet:  Cardiac thin liquids Activity:   Bathroom privileges with assistance DVT Prophylaxis:  warfarin  CLINICALLY SIGNIFICANT STUDIES Basic  Metabolic Panel:  Lab 08/17/11 0454 08/17/11 0218 08/16/11 2120 08/16/11 2059  NA 137 -- 141 --  K 4.2 -- 4.4 --  CL 104 -- 109 --  CO2 19 -- -- 21  GLUCOSE 113* -- 110* --  BUN 46* -- 46* --  CREATININE 1.94* -- 2.70* --  CALCIUM 9.4 -- -- 9.8  MG -- 1.8 -- --  PHOS -- -- -- --   Liver Function Tests:  Lab 08/17/11 0218 08/16/11 2059  AST 34 35  ALT 22 24  ALKPHOS 77 82  BILITOT 0.4 0.3  PROT 7.2 7.6  ALBUMIN 3.7 4.0   CBC:  Lab 08/17/11 0630 08/16/11 2120 08/16/11 2059  WBC 4.2 -- 5.8  NEUTROABS -- -- 3.2  HGB 12.7* 13.3 --  HCT 37.3* 39.0 --  MCV 89.9 -- 88.7  PLT 123* -- 139*   Coagulation:  Lab 08/17/11 0630 08/16/11 2059 08/10/11 1612  LABPROT 28.8* 26.8* --  INR 2.66* 2.43* 2.4   Cardiac Enzymes:  Lab 08/17/11 0218 08/16/11 2059  CKTOTAL 348* 404*  CKMB 6.0* 6.7*  CKMBINDEX -- --  TROPONINI <0.30 <0.30   Urinalysis: No results found for this basename: COLORURINE:2,APPERANCEUR:2,LABSPEC:2,PHURINE:2,GLUCOSEU:2,HGBUR:2,BILIRUBINUR:2,KETONESUR:2,PROTEINUR:2,UROBILINOGEN:2,NITRITE:2,LEUKOCYTESUR:2 in the last 168 hours  Lipid Panel    Component Value Date/Time   CHOL 122 08/17/2011 0630   TRIG 211* 08/17/2011 0630  HDL 37* 08/17/2011 0630   CHOLHDL 3.3 08/17/2011 0630   VLDL 42* 08/17/2011 0630   LDLCALC 43 08/17/2011 0630   HgbA1C  Lab Results  Component Value Date   HGBA1C 5.9* 01/01/2011   Urine Drug Screen:     Component Value Date/Time   LABOPIA NONE DETECTED 08/16/2011 2137   COCAINSCRNUR NONE DETECTED 08/16/2011 2137   LABBENZ NONE DETECTED 08/16/2011 2137   AMPHETMU NONE DETECTED 08/16/2011 2137   THCU NONE DETECTED 08/16/2011 2137   LABBARB NONE DETECTED 08/16/2011 2137    Alcohol Level: No results found for this basename: ETH:2 in the last 168 hours  CT of the brain  08/16/2011   1.  No acute intracranial abnormality. These results were called by telephone on 08/16/2011  at  2119 hours to  Dr. Fonnie Jarvis, who verbally acknowledged these results. 2.   Atrophy and chronic microvascular white matter ischemic changes.   MRI of the brain  08/17/2011    1.  Acute non hemorrhagic infarct of the posterior right corona radiata. 2.  Occluded left internal carotid artery, a chronic finding. 3.  Moderate atrophy and extensive white matter disease is otherwise stable.   MRA of the brain  08/17/2011  1.  Occluded left internal carotid artery is reconstituted at the level of the posterior communicating artery. 2.  Moderate small vessel attenuation anteriorly and posteriorly is worse on the left.  This is likely a flow phenomenon anteriorly.  2D Echocardiogram    Carotid Doppler    CXR    EKG  nonspecific T wave change, sinus bradycardia, incomplete RBBB, PAC's noted, left axis deviation, low voltage QRS.   Therapy Recommendations PT -, OT -  Physical Exam   Pleasant obese middle-aged Caucasian male currently not in distress.Awake alert. Afebrile. Head is nontraumatic. Neck is supple without bruit. Hearing is normal. Cardiac exam no murmur or gallop. Lungs are clear to auscultation. Distal pulses are well felt.  Neurological exam :Awake alert oriented x 3 normal speech and language. Mild left lower face asymmetry. Tongue midline. No drift. Mild diminished fine finger movements on left. Orbits right over left upper extremity. Mild left grip weak.. Normal sensation . Normal coordination.  ASSESSMENT [name] is a 66 y.o. male with a posterior right corona radiata secondary to atrial fibrillation.  On warfarin prior to admission, INR 2.44 on arrival. Now on aspirin 81 mg orally every day and warfarin for secondary stroke prevention. Patient with resultant left hemiparesis and numbness, which is improving.  -CAD, CABG -GERD -Hypertension -Hyperlipidemia -Tobacco abuse -atrial fibrillation, on coumadin -stroke, Right perirolandic subcortical infarct status post IV tPA in  September 2012, no residual sx -chronic left ICA occlusion -OSA per sleep  study 07/24/2011  Hospital day # 1  TREATMENT/PLAN -Continue warfarin for secondary stroke prevention. -discontinue aspirin, no benefit and increases pts risk of hemorrhage -carotid ultrasound to eval right ICA -Consider ACCELERATE trial, use of Cholesteryl Ester Transfer Protein (CETP) Inhibitor: Potential of Evacetrapib to treat atherosclerosis and CAD. [facility] will contact patient with information about trial, screen for inclusion. -D/W medical resident Annie Main, Armanda Magic, ANP-BC, GNP-BC [facility] Pager: [phone] 08/17/2011 10:08 AM  Scribe for Dr. Delia Heady, Stroke Center Medical Director. He has personally reviewed chart, pertinent data, examined the patient and developed the plan of care. Pager:  [phone]

## 2011-08-17 NOTE — Evaluation (Signed)
Occupational Therapy Evaluation Patient Details Name: Dylan Morgan MRN: [id number] DOB: Jun 10, 1945 Today's Date: 08/17/2011 Time: 4098-1191 OT Time Calculation (min): 34 min  OT Assessment / Plan / Recommendation Clinical Impression  Pt is overall S-Min A UB/LB bathe/dress sitting w/ set up, static/dynamic sitting balance good, static standing balance fair, dynamic standing for functional mobility poor. Pt will benefit from acute OT followed by ?SNF vs HHOT depending on pt progress    OT Assessment  Patient needs continued OT Services    Follow Up Recommendations  HHOT vs SNF depending on pt progress   Barriers to Discharge      Equipment Recommendations  Defer to next venue    Recommendations for Other Services    Frequency  Min 2X/week    Precautions / Restrictions Precautions Precautions: Fall;Other (comment) (Contact) Restrictions Weight Bearing Restrictions: No   Pertinent Vitals/Pain HR 59-66 during assessment. Pt w/o c/o pain    ADL  Grooming: Simulated;Wash/dry hands;Wash/dry face;Set up;Supervision/safety Where Assessed - Grooming: Unsupported sitting Upper Body Bathing: Simulated;Right arm;Left arm;Abdomen;Supervision/safety;Set up Where Assessed - Upper Body Bathing: Unsupported sitting Lower Body Bathing: Simulated;Supervision/safety;Set up Where Assessed - Lower Body Bathing: Supported sitting Upper Body Dressing: Simulated;Supervision/safety;Set up Where Assessed - Upper Body Dressing: Unsupported sitting Lower Body Dressing: Performed;Supervision/safety;Set up;Other (comment) (pt crosses one leg over the other to don/doff socks) Where Assessed - Lower Body Dressing: Unsupported sitting Toilet Transfer: Simulated;Minimal assistance Toilet Transfer Method: Sit to stand;Stand pivot;Other (comment) (L LE weakness and pt reaching for counter) Acupuncturist: Bedside commode Toileting - Clothing Manipulation and Hygiene: Simulated;Set up;Minimal  assistance;Moderate assistance Where Assessed - Toileting Clothing Manipulation and Hygiene: Standing Tub/Shower Transfer Method: Not assessed Equipment Used: Gait belt;Other (comment) (Pt awaiting PT eval for DME ) Transfers/Ambulation Related to ADLs: Pt w/ LLE buckling when attempting to ambulate in room w/ IV pole and L UE assist. Awaiting PT eval for recommended Amb device. ADL Comments: Pt is overall S-Min A UB/LB bathe/dress sitting w/ set up, static/dynamic sitting balance good, static standing balance fair, dynamic standing for functional mobility poor. Pt will benefit from acute OT followed by ?SNF rehab vs HHOT depending on pt progress    OT Diagnosis: Generalized weakness  OT Problem List: Decreased strength;Decreased range of motion;Impaired balance (sitting and/or standing);Decreased safety awareness;Decreased coordination;Decreased knowledge of use of DME or AE;Decreased knowledge of precautions;Impaired sensation OT Treatment Interventions: Self-care/ADL training;Therapeutic activities;Therapeutic exercise;Patient/family education;Neuromuscular education;DME and/or AE instruction   OT Goals Acute Rehab OT Goals OT Goal Formulation: With patient Time For Goal Achievement: 08/31/11 Potential to Achieve Goals: Good ADL Goals Pt Will Perform Grooming: with modified independence;Standing at sink;Sitting at sink ADL Goal: Grooming - Progress: Goal set today Pt Will Perform Lower Body Bathing: with set-up;with modified independence;Sitting at sink;Standing at sink;Sit to stand from chair ADL Goal: Lower Body Bathing - Progress: Goal set today Pt Will Perform Upper Body Dressing: with modified independence;Sitting, chair;Sitting, bed ADL Goal: Upper Body Dressing - Progress: Goal set today Pt Will Perform Lower Body Dressing: with modified independence;with set-up;Sit to stand from chair;Sit to stand from bed ADL Goal: Lower Body Dressing - Progress: Goal set today Pt Will Transfer  to Toilet: with supervision;with modified independence;Raised toilet seat with arms ADL Goal: Toilet Transfer - Progress: Goal set today Pt Will Perform Toileting - Clothing Manipulation: with modified independence;Standing ADL Goal: Toileting - Clothing Manipulation - Progress: Goal set today Pt Will Perform Toileting - Hygiene: with modified independence;Sitting on 3-in-1 or toilet ADL Goal: Toileting -  Hygiene - Progress: Goal set today Pt Will Perform Tub/Shower Transfer: with supervision;with DME;Transfer tub bench ADL Goal: Tub/Shower Transfer - Progress: Goal set today  Visit Information  Last OT Received On: 08/17/11    Subjective Data  Subjective: I'm used to my bed at home, I layed down in bed to get a nap and the hand started going numb. Patient Stated Goal: Return home   Prior Functioning  Home Living Lives With: Significant other Available Help at Discharge: Friend(s) Type of Home: House Home Access: Stairs to enter [employer] of Steps: 4 STE Entrance Stairs-Rails: Can reach both Home Layout: Two level;Able to live on main level with bedroom/bathroom Alternate Level Stairs-Number of Steps: 14 STE Alternate Level Stairs-Rails: Right Bathroom Shower/Tub: Tub/shower unit;Curtain Bathroom Toilet: Standard Bathroom Accessibility: Yes How Accessible: Accessible via walker Home Adaptive Equipment: None Prior Function Level of Independence: Independent Able to Take Stairs?: Yes Driving: Yes 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, 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) Communication Communication: No difficulties Dominant Hand: Right    Cognition  Overall Cognitive Status: Appears within functional limits for tasks assessed/performed Arousal/Alertness: Awake/alert Orientation Level: Appears intact for tasks assessed Behavior During Session: Other (comment) (Somewhat implusive)    Extremity/Trunk Assessment Right Upper Extremity Assessment RUE  ROM/Strength/Tone: Within functional levels RUE Sensation: WFL - Light Touch Left Upper Extremity Assessment LUE ROM/Strength/Tone: Within functional levels LUE Sensation: Deficits (pt reports paresthesias L hand/arm) LUE Coordination: Deficits LUE Coordination Deficits: Gross motor WFL's, FM impaired for tip to tip prehension, coordination activities Right Lower Extremity Assessment RLE ROM/Strength/Tone: Within functional levels Left Lower Extremity Assessment LLE ROM/Strength/Tone: Within functional levels   Mobility Bed Mobility Bed Mobility: Supine to Sit Supine to Sit: 5: Supervision;6: Modified independent (Device/Increase time) Transfers Transfers: Sit to Stand Sit to Stand: 4: Min guard;5: Supervision;With upper extremity assist;From bed;From chair/3-in-1 Details for Transfer Assistance: Pt demonstrates LLE weakness and buckling this am with attempted functional mobility in room w/ use of IV pole and HHA Left (awaiting PT eval for recommendation for ambulation device).       Balance Balance Balance Assessed: Yes Static Sitting Balance Static Sitting - Balance Support: No upper extremity supported Dynamic Sitting Balance Dynamic Sitting - Balance Support: No upper extremity supported;Feet supported;During functional activity (SBA/Steady A) Static Standing Balance Static Standing - Balance Support: Bilateral upper extremity supported Dynamic Standing Balance Dynamic Standing - Balance Support: Bilateral upper extremity supported;During functional activity (LLE buckling w/ attempted functional mobility)  End of Session OT - End of Session Equipment Utilized During Treatment: Gait belt;Other (comment) (rec RW) Activity Tolerance: Patient tolerated treatment well Patient left: in bed;with call bell/phone within reach;with bed alarm set   Roselie Awkward Dixon 08/17/2011, 12:26 PM

## 2011-08-17 NOTE — Discharge Instructions (Signed)
STROKE/TIA DISCHARGE INSTRUCTIONS SMOKING Cigarette smoking nearly doubles your risk of having a stroke & is the single most alterable risk factor  If you smoke or have smoked in the last 12 months, you are advised to quit smoking for your health.  Most of the excess cardiovascular risk related to smoking disappears within a year of stopping.  Ask you doctor about anti-smoking medications  [place] Quit Line: 1-800-QUIT NOW  Free Smoking Cessation Classes [phone]  CHOLESTEROL Know your levels; limit fat & cholesterol in your diet  Lipid Panel     Component Value Date/Time   CHOL 160 01/01/2011 0534   TRIG 297* 01/01/2011 0534   HDL 33* 01/01/2011 0534   CHOLHDL 4.8 01/01/2011 0534   VLDL 59* 01/01/2011 0534   LDLCALC 68 01/01/2011 0534      Many patients benefit from treatment even if their cholesterol is at goal.  Goal: Total Cholesterol (CHOL) less than 160  Goal:  Triglycerides (TRIG) less than 150  Goal:  HDL greater than 40  Goal:  LDL (LDLCALC) less than 100   BLOOD PRESSURE American Stroke Association blood pressure target is less that 120/80 mm/Hg  Your discharge blood pressure is:  BP: 129/65 mmHg  Monitor your blood pressure  Limit your salt and alcohol intake  Many individuals will require more than one medication for high blood pressure  DIABETES (A1c is a blood sugar average for last 3 months) Goal HGBA1c is under 7% (HBGA1c is blood sugar average for last 3 months)  Diabetes: {STROKE DC DIABETES:22357}    Lab Results  Component Value Date   HGBA1C 5.9* 01/01/2011     Your HGBA1c can be lowered with medications, healthy diet, and exercise.  Check your blood sugar as directed by your physician  Call your physician if you experience unexplained or low blood sugars.  PHYSICAL ACTIVITY/REHABILITATION Goal is 30 minutes at least 4 days per week    {STROKE DC ACTIVITY/REHAB:22359}  Activity decreases your risk of heart attack and stroke and makes your heart  stronger.  It helps control your weight and blood pressure; helps you relax and can improve your mood.  Participate in a regular exercise program.  Talk with your doctor about the best form of exercise for you (dancing, walking, swimming, cycling).  DIET/WEIGHT Goal is to maintain a healthy weight  Your discharge diet is: Cardiac *** liquids Your height is:  Height: 5\' 11"  (180.3 cm) Your current weight is: Weight: 108.8 kg (239 lb 13.8 oz) Your Body Mass Index (BMI) is:  BMI (Calculated): 33.5   Following the type of diet specifically designed for you will help prevent another stroke.  Your goal weight range is:  ***  Your goal Body Mass Index (BMI) is 19-24.  Healthy food habits can help reduce 3 risk factors for stroke:  High cholesterol, hypertension, and excess weight.  RESOURCES Stroke/Support Group:  Call [phone]  they meet the 3rd Sunday of the month on the Rehab Unit at [facility], [place] ( no meetings June, July & Aug).  STROKE EDUCATION PROVIDED/REVIEWED AND GIVEN TO PATIENT Stroke warning signs and symptoms How to activate emergency medical system (call 911). Medications prescribed at discharge. Need for follow-up after discharge. Personal risk factors for stroke. Pneumonia vaccine given:   {STROKE DC YES/NO/DATE:22363} Flu vaccine given:   {STROKE DC YES/NO/DATE:22363} My questions have been answered, the writing is legible, and I understand these instructions.  I will adhere to these goals & educational materials that have  been provided to me after my discharge from the hospital.

## 2011-08-17 NOTE — Evaluation (Signed)
Physical Therapy Evaluation Patient Details Name: Dylan Morgan MRN: [id number] DOB: 01/13/1946 Today's Date: 08/17/2011 Time: 4098-1191 PT Time Calculation (min): 15 min  PT Assessment / Plan / Recommendation Clinical Impression  Pt presents with a medical diagnosis of CVA with decreased safety awareness and balance. Pt will benefit from skilled PT in the acute care setting in order to maximize functional mobility and safety prior to d/c    PT Assessment  Patient needs continued PT services    Follow Up Recommendations  Inpatient Rehab;Supervision/Assistance - 24 hour    Barriers to Discharge        lEquipment Recommendations  Defer to next venue    Recommendations for Other Services Rehab consult   Frequency Min 4X/week    Precautions / Restrictions Precautions Precautions: Fall Restrictions Weight Bearing Restrictions: No         Mobility  Bed Mobility Bed Mobility: Supine to Sit Supine to Sit: 5: Supervision Details for Bed Mobility Assistance: Increased time to complete bed mobility to the right. VC for sequencing Transfers Transfers: Sit to Stand;Stand to Sit Sit to Stand: 4: Min guard;With upper extremity assist;From bed Stand to Sit: 4: Min guard;With upper extremity assist;To chair/3-in-1 Details for Transfer Assistance: VC for hand placement for safety. No buckling noted upon standing although minguard for safety due to slight right lean Ambulation/Gait Ambulation/Gait Assistance: 3: Mod assist;4: Min assist Ambulation Distance (Feet): 40 Feet Assistive device: Rolling walker Ambulation/Gait Assistance Details: Min assist to mod assist at times due to pt with lateral lean.  Max verbal cues throughout for safety and to decrease speed in order to increase stability. Pt able to correct lateral lean with cueing. Gait Pattern: Step-to pattern;Lateral trunk lean to right;Narrow base of support;Decreased hip/knee flexion - left Modified Rankin (Stroke Patients  Only) Pre-Morbid Rankin Score: No symptoms Modified Rankin: Moderately severe disability    Exercises     PT Diagnosis: Difficulty walking  PT Problem List: Decreased activity tolerance;Decreased mobility;Decreased balance;Decreased knowledge of use of DME;Decreased safety awareness;Decreased knowledge of precautions PT Treatment Interventions: DME instruction;Gait training;Functional mobility training;Therapeutic activities;Balance training;Neuromuscular re-education;Patient/family education   PT Goals Acute Rehab PT Goals PT Goal Formulation: With patient Time For Goal Achievement: 08/24/11 Potential to Achieve Goals: Good Pt will go Supine/Side to Sit: with modified independence PT Goal: Supine/Side to Sit - Progress: Goal set today Pt will go Sit to Stand: with modified independence PT Goal: Sit to Stand - Progress: Goal set today Pt will go Stand to Sit: with supervision PT Goal: Stand to Sit - Progress: Goal set today Pt will Transfer Bed to Chair/Chair to Bed: with supervision PT Transfer Goal: Bed to Chair/Chair to Bed - Progress: Goal set today Pt will Ambulate: >150 feet;with min assist;with least restrictive assistive device PT Goal: Ambulate - Progress: Goal set today  Visit Information  Last PT Received On: 08/17/11 Assistance Needed: +2 (for safety)    Subjective Data      Prior Functioning  Home Living Lives With: Significant other Available Help at Discharge: Friend(s) Type of Home: House Home Access: Stairs to enter [employer] of Steps: 4 STE Entrance Stairs-Rails: Can reach both Home Layout: Two level;Able to live on main level with bedroom/bathroom Alternate Level Stairs-Number of Steps: 14 STE Alternate Level Stairs-Rails: Right Bathroom Shower/Tub: Tub/shower unit;Curtain Bathroom Toilet: Standard Bathroom Accessibility: Yes How Accessible: Accessible via walker Home Adaptive Equipment: None Prior Function Level of Independence:  Independent Able to Take Stairs?: Yes Driving: Yes Vocation: Part time employment  Communication Communication: No difficulties Dominant Hand: Right    Cognition  Overall Cognitive Status: Appears within functional limits for tasks assessed/performed Arousal/Alertness: Awake/alert Orientation Level: Appears intact for tasks assessed Behavior During Session: Other (comment) (impulsive) Cognition - Other Comments: decreased safety awareness, numerous cues throughout to decrease speed for safety due to balance loss    Extremity/Trunk Assessment Right Lower Extremity Assessment RLE ROM/Strength/Tone: Within functional levels RLE Sensation: WFL - Light Touch Left Lower Extremity Assessment LLE ROM/Strength/Tone: Within functional levels LLE Sensation: WFL - Light Touch   Balance Balance Balance Assessed: Yes Dynamic Standing Balance Dynamic Standing - Balance Support: Bilateral upper extremity supported;During functional activity Dynamic Standing - Level of Assistance: 3: Mod assist  End of Session PT - End of Session Equipment Utilized During Treatment: Gait belt Activity Tolerance: Patient tolerated treatment well Patient left: in chair;with call bell/phone within reach Nurse Communication: Mobility status   Milana Kidney 08/17/2011, 4:33 PM  08/17/2011 Milana Kidney DPT PAGER: [phone] OFFICE: [phone]

## 2011-08-17 NOTE — Progress Notes (Signed)
ANTICOAGULATION CONSULT NOTE - Initial Consult  Pharmacy Consult for Coumadin Indication: h/o atrial fibrillation  Allergies  Allergen Reactions  . Diltiazem Hcl Other (See Comments)    unknown  . Oxycodone-Acetaminophen Other (See Comments)    unknown    Patient Measurements: Height: 5\' 11"  (180.3 cm) Weight: 239 lb 13.8 oz (108.8 kg) IBW/kg (Calculated) : 75.3   Vital Signs: Temp: 97.9 F (36.6 C) (05/16 0146) Temp src: Oral (05/15 2333) BP: 128/72 mmHg (05/16 0138) Pulse Rate: 59  (05/16 0138)  Labs:  [facility] 08/16/11 2120 08/16/11 2059  HGB 13.3 13.4  HCT 39.0 39.2  PLT -- 139*  APTT -- 40*  LABPROT -- 26.8*  INR -- 2.43*  HEPARINUNFRC -- --  CREATININE 2.70* 2.42*  CKTOTAL -- 404*  CKMB -- 6.7*  TROPONINI -- <0.30    Estimated Creatinine Clearance: 32.9 ml/min (by C-G formula based on Cr of 2.7).   Medical History: Past Medical History  Diagnosis Date  . CAD (coronary artery disease) CABG in 2004    F/U by Dr Genia Harold cardiology, cardiolite scan done elsewhere in October 2010 that was normal, no ischemia seen, EF 55-65% in 02/2005  . GERD (gastroesophageal reflux disease)   . Hypertension   . Hyperlipidemia   . Renal insufficiency   . Cellulitis and abscess of leg     right leg  . Tobacco abuse   . Chronic back pain   . Chronic neck pain   . Seborrheic dermatitis of scalp   . Abnormal LFTs     Hx of abnormal LFT's  . Bradycardia   . AAA (abdominal aortic aneurysm)     November 2010, Stenting, November, 2010  . Paroxysmal atrial fibrillation 02/22/2009    diltiazem po started but discontinued due to side effects.  . Stroke   . Ejection fraction     EF 40% in the past  /   EF 55-65% echo, November, 2006  . Hx of CABG     2004  . Carotid artery disease     Doppler, October, 2012, and old LICA occlusion , RICA no significant abnormality    Medications:  Scheduled:    . sodium chloride  3 mL Intravenous STAT    Assessment: 66 yo  male with h/o atrial fibrillation on Coumadin PTA admitted with probable TIA. Pharmacy consulted to manage Coumadin. INR (2.43) is currently at-goal.   Goal of Therapy:  INR 2-3 Monitor platelets by anticoagulation protocol: Yes   Plan:  1. Continue home Coumadin regimen of 5mg  daily. 2. Daily PT / INR.  Emeline Gins 08/17/2011,2:23 AM

## 2011-08-18 ENCOUNTER — Encounter (HOSPITAL_COMMUNITY): Payer: Self-pay | Admitting: Internal Medicine

## 2011-08-18 DIAGNOSIS — R7303 Prediabetes: Secondary | ICD-10-CM | POA: Insufficient documentation

## 2011-08-18 DIAGNOSIS — I4891 Unspecified atrial fibrillation: Secondary | ICD-10-CM

## 2011-08-18 DIAGNOSIS — I6340 Cerebral infarction due to embolism of unspecified cerebral artery: Secondary | ICD-10-CM

## 2011-08-18 LAB — BASIC METABOLIC PANEL
BUN: 33 mg/dL — ABNORMAL HIGH (ref 6–23)
CO2: 22 mEq/L (ref 19–32)
Chloride: 102 mEq/L (ref 96–112)
Creatinine, Ser: 1.54 mg/dL — ABNORMAL HIGH (ref 0.50–1.35)

## 2011-08-18 LAB — PROTIME-INR: INR: 2.6 — ABNORMAL HIGH (ref 0.00–1.49)

## 2011-08-18 NOTE — Progress Notes (Signed)
Stroke Team Progress Note  HISTORY Dylan Morgan is an 66 y.o. male history on the right CVA in September 2012, paroxysmal atrial fibrillation on anticoagulation, coronary artery disease, hypertension, hyperlipidemia and known left internal carotid occlusion presenting with new onset left face arm and leg weakness and numbness as well as slurred speech with onset at 8:20 PM 08/16/2011. CT scan of his head showed no acute intracranial abnormality. Small vessel white matter changes were noted. INR was 2.43. Patient's deficits resolved rapidly after arriving in the emergency room. He was not considered a candidate for TPA because of the rapid resolution of his symptoms as well as therapeutic INR on Coumadin. She was admitted for workup to rule out recurrent subcortical stroke versus TIA.  SUBJECTIVE Wife is at the bedside.  Overall he feels his condition is gradually improving. Patient upset over known ICA stenosis and that OR is not recommended. "I want it fixed" he replied.  OBJECTIVE Most recent Vital Signs: Filed Vitals:   08/17/11 2111 08/18/11 0106 08/18/11 0515 08/18/11 0812  BP: 106/73 117/81 109/68 103/75  Pulse: 56 53 41 54  Temp: 98.1 F (36.7 C) 97 F (36.1 C) 97.5 F (36.4 C) 98.8 F (37.1 C)  TempSrc: Oral Oral Oral Oral  Resp: 20 20 20 20   Height:      Weight:  109.6 kg (241 lb 10 oz)    SpO2: 98% 95% 99% 98%   CBG (last 3)  Basename 08/16/11 2101  GLUCAP 110*   Intake/Output from previous day: 05/16 0701 - 05/17 0700 In: 1758.8 [P.O.:440; I.V.:1318.8] Out: 1650 [Urine:1650]  IV Fluid Intake:     . sodium chloride 75 mL/hr at 08/18/11 0728   MEDICATIONS    . lisinopril  10 mg Oral Daily  . simvastatin  10 mg Oral QHS  . warfarin  5 mg Oral q1800  . Warfarin - Pharmacist Dosing Inpatient   Does not apply q1800  . DISCONTD: aspirin EC  81 mg Oral Daily   PRN:  acetaminophen, ondansetron (ZOFRAN) IV, ondansetron  Diet:  Cardiac thin liquids Activity:   Bathroom  privileges with assistance DVT Prophylaxis:  warfarin  CLINICALLY SIGNIFICANT STUDIES Basic Metabolic Panel:  Lab 08/18/11 1610 08/17/11 0630 08/17/11 0218  NA 134* 137 --  K 4.4 4.2 --  CL 102 104 --  CO2 22 19 --  GLUCOSE 99 113* --  BUN 33* 46* --  CREATININE 1.54* 1.94* --  CALCIUM 9.0 9.4 --  MG -- -- 1.8  PHOS -- -- --   Liver Function Tests:  Lab 08/17/11 0218 08/16/11 2059  AST 34 35  ALT 22 24  ALKPHOS 77 82  BILITOT 0.4 0.3  PROT 7.2 7.6  ALBUMIN 3.7 4.0   CBC:  Lab 08/17/11 0630 08/16/11 2120 08/16/11 2059  WBC 4.2 -- 5.8  NEUTROABS -- -- 3.2  HGB 12.7* 13.3 --  HCT 37.3* 39.0 --  MCV 89.9 -- 88.7  PLT 123* -- 139*   Coagulation:  Lab 08/18/11 0620 08/17/11 0630 08/16/11 2059  LABPROT 28.3* 28.8* 26.8*  INR 2.60* 2.66* 2.43*   Cardiac Enzymes:  Lab 08/17/11 0850 08/17/11 0218 08/16/11 2059  CKTOTAL 318* 348* 404*  CKMB 5.8* 6.0* 6.7*  CKMBINDEX -- -- --  TROPONINI <0.30 <0.30 <0.30   Lipid Panel    Component Value Date/Time   CHOL 122 08/17/2011 0630   TRIG 211* 08/17/2011 0630   HDL 37* 08/17/2011 0630   CHOLHDL 3.3 08/17/2011 0630   VLDL  42* 08/17/2011 0630   LDLCALC 43 08/17/2011 0630   HgbA1C  Lab Results  Component Value Date   HGBA1C 6.0* 08/17/2011   Urine Drug Screen:     Component Value Date/Time   LABOPIA NONE DETECTED 08/16/2011 2137   COCAINSCRNUR NONE DETECTED 08/16/2011 2137   LABBENZ NONE DETECTED 08/16/2011 2137   AMPHETMU NONE DETECTED 08/16/2011 2137   THCU NONE DETECTED 08/16/2011 2137   LABBARB NONE DETECTED 08/16/2011 2137    Alcohol Level: No results found for this basename: ETH:2 in the last 168 hours  CT of the brain  08/16/2011   1.  No acute intracranial abnormality. These results were called by telephone on 08/16/2011  at  2119 hours to  Dr. Fonnie Jarvis, who verbally acknowledged these results. 2.  Atrophy and chronic microvascular white matter ischemic changes.   MRI of the brain  08/17/2011    1.  Acute non hemorrhagic  infarct of the posterior right corona radiata. 2.  Occluded left internal carotid artery, a chronic finding. 3.  Moderate atrophy and extensive white matter disease is otherwise stable.   MRA of the brain  08/17/2011  1.  Occluded left internal carotid artery is reconstituted at the level of the posterior communicating artery. 2.  Moderate small vessel attenuation anteriorly and posteriorly is worse on the left.  This is likely a flow phenomenon anteriorly.  2D Echocardiogram    Carotid Doppler  There is evidence of right internal carotid artery stenosis in the 40-59% range. The left internal carotid artery is occluded. The right vertebral artery demonstrates atypical flow, left vertebral artery has antegrade flow.   CXR    EKG  nonspecific T wave change, sinus bradycardia, incomplete RBBB, PAC's noted, left axis deviation, low voltage QRS.   Therapy Recommendations PT -inpatient rehab, OT -home health vs SNF  Physical Exam   Pleasant obese middle-aged Caucasian male currently not in distress.Awake alert. Afebrile. Head is nontraumatic. Neck is supple without bruit. Hearing is normal. Cardiac exam no murmur or gallop. Lungs are clear to auscultation. Distal pulses are well felt.  Neurological exam :Awake alert oriented x 3 normal speech and language. Mild left lower face asymmetry. Tongue midline. No drift. Mild diminished fine finger movements on left. Orbits right over left upper extremity. Mild left grip weak.. Normal sensation . Normal coordination.   ASSESSMENT Dylan Morgan is a 66 y.o. male with a posterior right corona radiata secondary to atrial fibrillation.  On warfarin prior to admission, INR 2.44 on arrival. Now on aspirin 81 mg orally every day and warfarin for secondary stroke prevention. Patient with resultant left hemiparesis and numbness, which is improving.  -CAD, CABG -GERD -Hypertension -Hyperlipidemia, LDL 63 -Tobacco abuse -atrial fibrillation, on  coumadin -stroke, Right perirolandic subcortical infarct status post IV tPA in  September 2012, no residual sx -chronic left ICA occlusion -Right ICA stenosis 40-59%, no indication for surgery at this time -OSA per sleep study 07/24/2011  Hospital day # 2  TREATMENT/PLAN -Continue warfarin for secondary stroke prevention. -discontinue aspirin, from neuro viewpoint, no benefit and increases pts risk of hemorrhage. . -OP assessment right ICA stenosis in 6 mos -Consider ACCELERATE trial, use of Cholesteryl Ester Transfer Protein (CETP) Inhibitor: Potential of Evacetrapib to treat atherosclerosis and CAD. [facility] will contact patient with information about trial, screen for inclusion. -disposition per primary. Multiple recommendations. Will depend on home situation. -Stroke Service will sign off. Follow up with Dr. Pearlean Brownie in 2 months.3 -D/W medical  team  Annie Main, AVNP, ANP-BC, GNP-BC [facility] Pager: [phone] 08/18/2011 9:34 AM  Scribe for Dr. Delia Heady, Stroke Center Medical Director. He has personally reviewed chart, pertinent data, examined the patient and developed the plan of care. Pager:  [phone]

## 2011-08-18 NOTE — Progress Notes (Signed)
ANTICOAGULATION CONSULT NOTE - Follow Up Consult  Pharmacy Consult for Coumadin Indication: PAF, new CVA  Allergies  Allergen Reactions  . Diltiazem Hcl Other (See Comments)    unknown  . Oxycodone-Acetaminophen Other (See Comments)    unknown    Labs:  Basename 08/18/11 0620 08/17/11 0850 08/17/11 0630 08/17/11 0218 08/16/11 2120 08/16/11 2059  HGB -- -- 12.7* -- 13.3 --  HCT -- -- 37.3* -- 39.0 39.2  PLT -- -- 123* -- -- 139*  APTT -- -- -- -- -- 40*  LABPROT 28.3* -- 28.8* -- -- 26.8*  INR 2.60* -- 2.66* -- -- 2.43*  HEPARINUNFRC -- -- -- -- -- --  CREATININE 1.54* -- 1.94* -- 2.70* --  CKTOTAL -- 318* -- 348* -- 404*  CKMB -- 5.8* -- 6.0* -- 6.7*  TROPONINI -- <0.30 -- <0.30 -- <0.30    Estimated Creatinine Clearance: 57.8 ml/min (by C-G formula based on Cr of 1.54).  Assessment: INR therapeutic No bleeding noted  Goal of Therapy:  INR 2-3 Monitor platelets by anticoagulation protocol: Yes   Plan:  1) Coumadin 5 mg po daily at 1800 pm 2) Daily INR  Thank you Okey Regal, PharmD Clinical Pharmacist [phone]

## 2011-08-18 NOTE — Discharge Summary (Signed)
Internal Medicine Teaching [facility] Discharge Note  Name: Dylan Morgan MRN: [id number] DOB: 02-16-1946 66 y.o.  Date of Admission: 08/16/2011  8:55 PM Date of Discharge: 08/18/2011 Attending Physician: Rocco Serene, MD  Discharge Diagnosis: 1. R corona radiata stroke 2. HTN 3. HL 4. Paroxysmal A-fib on coumadin 5. CKD, baseline 1.3-1.5 6. CAD s/p CABG in 2004 7. Chronic back pain 8. Pre-diabetes, A1c 6.0  Discharge Medications: Medication List  As of 08/18/2011 11:39 AM   TAKE these medications         acetaminophen 325 MG tablet   Commonly known as: TYLENOL   Take 2 tablets (650 mg total) by mouth every 4 (four) hours as needed for pain.      lisinopril 10 MG tablet   Commonly known as: PRINIVIL,ZESTRIL   Take 10 mg by mouth daily.      MULTIVITAMIN PO   Take 1 tablet by mouth daily.      sildenafil 100 MG tablet   Commonly known as: VIAGRA   Take 1 tablet (100 mg total) by mouth as needed for erectile dysfunction.      simvastatin 10 MG tablet   Commonly known as: ZOCOR   Take 1 tablet (10 mg total) by mouth at bedtime.      vitamin E 1000 UNIT capsule   Take 1,000 Units by mouth daily.      warfarin 5 MG tablet   Commonly known as: COUMADIN   Take 5 mg by mouth daily.            Disposition and follow-up:   Dylan Morgan was discharged from [facility] in Stable condition.  At the hospital follow up visit please address: 1. Stroke: pt should have home PT/OT.  Please make sure he is feeling good! 2. Carotid stenosis: doppler of the RCA showed 40-59% stenosis.  He will need periodic monitoring of this given his R sided stroke. 3. Warfarin: con't warfarin, no ASA per IMTS and neuro 4. Pre-DM: A1c should be followed q6 months given aggressive risk modification necessary  Follow-up Appointments: Follow-up Information    Follow up with Margorie John, MD on 09/01/2011. (11:15am)    Contact information:   [address] [place] [address] [phone]         Discharge Orders    Future Appointments: Provider: Department: Dept Phone: Center:   08/23/2011 9:00 AM Lanell Persons, PT [facility] [place] [phone] [facility]   08/25/2011 3:30 PM [facility] [phone] None   09/01/2011 11:15 AM Margorie John, MD [facility] [phone] [facility]   10/20/2011 4:15 PM Luis Abed, MD [facility] [phone] [facility]     Future Orders Please Complete By Expires   Increase activity slowly         Consultations:  neuro  Procedures Performed:  Ct Head Wo Contrast  08/16/2011  *RADIOLOGY REPORT*  Clinical Data: Code stroke.  Slurred speech with left arm weakness and left facial droop.  Left leg weakness.  CT HEAD WITHOUT CONTRAST  Technique:  Contiguous axial images were obtained from the base of the skull through the vertex without contrast.  Comparison: 01/24/2011.  Findings: No evidence of acute infarct, acute hemorrhage, mass lesion, mass effect or hydrocephalus.  There is patchy low attenuation in the periventricular white matter.  Mild atrophy. Visualized portions of the paranasal sinuses and mastoid air cells are clear.  IMPRESSION:  1.  No  acute intracranial abnormality. These results were called by telephone on 08/16/2011  at  2119 hours to  Dr. Fonnie Jarvis, who verbally acknowledged these results. 2.  Atrophy and chronic microvascular white matter ischemic changes.  Original Report Authenticated By: Reyes Ivan, M.D.   Mri Brain Without Contrast  08/17/2011  *RADIOLOGY REPORT*  Clinical Data:  TIA.  Left-sided facial weakness and facial droop. Slurred speech.  MRI HEAD WITHOUT CONTRAST MRA HEAD WITHOUT CONTRAST  Technique:  Multiplanar, multiecho pulse sequences of the brain and surrounding structures were obtained without intravenous contrast. Angiographic images of the head were obtained using MRA technique without contrast.  Comparison:  CT head  08/16/2011.  MRI brain 01/01/2011.  MRI HEAD  Findings:  Focal restricted diffusion is evident in the posterior right carinae corona radiata.  T2 and FLAIR signal is associated.  No hemorrhage or mass lesion is present.  Atrophy and moderate white matter disease is otherwise stable.  The left internal carotid artery is occluded below the posterior communicating artery.  Flow is present in the right internal carotid artery and vertebral arteries bilaterally.  The patient is status post scleral banding on the left.  A lens extraction has been performed on the right.  Mild mucosal thickening is noted in the anterior ethmoid air cells and maxillary sinuses.  The paranasal sinuses are otherwise clear.  There is some fluid in the mastoid air cells.  No obstructing nasopharyngeal lesion is evident.  IMPRESSION:  1.  Acute non hemorrhagic infarct of the posterior right corona radiata. 2.  Occluded left internal carotid artery, a chronic finding. 3.  Moderate atrophy and extensive white matter disease is otherwise stable.  MRA HEAD  Findings: The left internal carotid artery is chronically occluded. It is reconstituted at the level of the posterior communicating artery.  The right internal carotid artery is within normal limits. The right posterior communicating artery is patent.  The anterior communicating artery is patent.  The ACA branch vessels are within normal limits.  The MCA bifurcations are within normal limits bilaterally.  There is some tapering of distal MCA branch vessels, more apparent on the left.  The left vertebral artery is the dominant vessel.  The PICA origins are visualized and within normal limits bilaterally.  A prominent left AICA is also noted.  The basilar artery is within normal limits.  The left posterior cerebral artery originates from basilar tip. The right posterior cerebral artery is predominately of fetal type with a small right P1 segment.  There is some attenuation of distal PCA branch  vessels, more prominent on the left.  IMPRESSION:  1.  Occluded left internal carotid artery is reconstituted at the level of the posterior communicating artery. 2.  Moderate small vessel attenuation anteriorly and posteriorly is worse on the left.  This is likely a flow phenomenon anteriorly.  Original Report Authenticated By: Jamesetta Orleans. MATTERN, M.D.   Dylan Morgan Head/brain Wo Cm  08/17/2011  *RADIOLOGY REPORT*  Clinical Data:  TIA.  Left-sided facial weakness and facial droop. Slurred speech.  MRI HEAD WITHOUT CONTRAST MRA HEAD WITHOUT CONTRAST  Technique:  Multiplanar, multiecho pulse sequences of the brain and surrounding structures were obtained without intravenous contrast. Angiographic images of the head were obtained using MRA technique without contrast.  Comparison:  CT head 08/16/2011.  MRI brain 01/01/2011.  MRI HEAD  Findings:  Focal restricted diffusion is evident in the posterior right carinae corona radiata.  T2 and FLAIR signal is associated.  No hemorrhage or  mass lesion is present.  Atrophy and moderate white matter disease is otherwise stable.  The left internal carotid artery is occluded below the posterior communicating artery.  Flow is present in the right internal carotid artery and vertebral arteries bilaterally.  The patient is status post scleral banding on the left.  A lens extraction has been performed on the right.  Mild mucosal thickening is noted in the anterior ethmoid air cells and maxillary sinuses.  The paranasal sinuses are otherwise clear.  There is some fluid in the mastoid air cells.  No obstructing nasopharyngeal lesion is evident.  IMPRESSION:  1.  Acute non hemorrhagic infarct of the posterior right corona radiata. 2.  Occluded left internal carotid artery, a chronic finding. 3.  Moderate atrophy and extensive white matter disease is otherwise stable.  MRA HEAD  Findings: The left internal carotid artery is chronically occluded. It is reconstituted at the level of the  posterior communicating artery.  The right internal carotid artery is within normal limits. The right posterior communicating artery is patent.  The anterior communicating artery is patent.  The ACA branch vessels are within normal limits.  The MCA bifurcations are within normal limits bilaterally.  There is some tapering of distal MCA branch vessels, more apparent on the left.  The left vertebral artery is the dominant vessel.  The PICA origins are visualized and within normal limits bilaterally.  A prominent left AICA is also noted.  The basilar artery is within normal limits.  The left posterior cerebral artery originates from basilar tip. The right posterior cerebral artery is predominately of fetal type with a small right P1 segment.  There is some attenuation of distal PCA branch vessels, more prominent on the left.  IMPRESSION:  1.  Occluded left internal carotid artery is reconstituted at the level of the posterior communicating artery. 2.  Moderate small vessel attenuation anteriorly and posteriorly is worse on the left.  This is likely a flow phenomenon anteriorly.  Original Report Authenticated By: Jamesetta Orleans. MATTERN, M.D.    Admission HPI: Dylan. Wotton is a 66 year old man with a history of CVA in September 2012, paroxysmal atrial fibrillation, left internal carotid occlusion, coronary artery disease status post bypass grafting, abdominal aortic aneurysm status post stenting, and chronic kidney disease who presents with the acute onset of left arm weakness and numbness that progressed to include left leg weakness and numbness. He was in his usual state of health until last evening when in bed he noted that his left arm began to tingle. This was followed by weakness of the arm. He got up out of bed and began having trouble walking related to left leg weakness and numbness. He asked his friend to call 911 and was taken to the emergency department. Soon after arrival much of his symptoms had resolved.  He is left with subjective weakness of the left leg. He currently is without any other complaints.   Hospital Course by problem list: # R corona radiata stroke  Patient presented with left-sided neurologic deficits, neurology was consulted, but the patient was not a candidate for TPA given partial resolution of his symptoms in the emergency room and his therapeutic Coumadin level. MRI showed acute non hemorrhagic infarct of the right corona radiata. The patient is on Coumadin for paroxysmal atrial fibrillation.  After discussion with neurology, it was decided to keep the patient on Coumadin and to refrain from starting aspirin. The risks of bleeding outweigh the benefits of starting aspirin despite the  patient's known coronary disease. He is nearly 10 years removed from his CABG.  The patient had nearly complete resolution of his sx by the time of d/c.  The patient was discharged home with home physical therapy and occupational therapy. He will be contacted by neurology for possible participation in a clinical trial for a new lipid modifying agent. In the interim, the patient will continue with aggressive risk factor modification. Carotid Dopplers during admission showed known complete occlusion of the left carotid and 40-59% stenosis of the right carotid.  Neither the left nor the right meet criteria for surgical intervention. He will need to have his carotid stenosis monitored moving forward in the future.   # HTN  Patient's blood pressure remained well controlled on lisinopril.    # Acute on chronic CKD  Patient did present with acute kidney injury with a creatinine of 2.7, which trended down to 1.5 at the time of discharge. His baseline is 1.3-1.5. This was likely prerenal in nature.  # Hyperlipidemia  Well controlled with LDL of 43. TG is 211, but is improved from 297. Will continue simvastatin 10 mg.  Discharge Vitals:  BP 108/68  Pulse 51  Temp(Src) 97.1 F (36.2 C) (Oral)  Resp 20  Ht 5'  11" (1.803 m)  Wt 241 lb 10 oz (109.6 kg)  BMI 33.70 kg/m2  SpO2 98%  Discharge Labs:  Results for orders placed during the hospital encounter of 08/16/11 (from the past 24 hour(s))  PROTIME-INR     Status: Abnormal   Collection Time   08/18/11  6:20 AM      Component Value Range   Prothrombin Time 28.3 (*) 11.6 - 15.2 (seconds)   INR 2.60 (*) 0.00 - 1.49   BASIC METABOLIC PANEL     Status: Abnormal   Collection Time   08/18/11  6:20 AM      Component Value Range   Sodium 134 (*) 135 - 145 (mEq/L)   Potassium 4.4  3.5 - 5.1 (mEq/L)   Chloride 102  96 - 112 (mEq/L)   CO2 22  19 - 32 (mEq/L)   Glucose, Bld 99  70 - 99 (mg/dL)   BUN 33 (*) 6 - 23 (mg/dL)   Creatinine, Ser 1.61 (*) 0.50 - 1.35 (mg/dL)   Calcium 9.0  8.4 - 09.6 (mg/dL)   GFR calc non Af Amer 45 (*) >90 (mL/min)   GFR calc Af Amer 52 (*) >90 (mL/min)    SignedAbner Greenspan, BEN 08/18/2011, 11:39 AM   Time Spent on Discharge: 30 min

## 2011-08-18 NOTE — Progress Notes (Signed)
Physical Therapy Treatment Patient Details Name: Dylan Morgan MRN: [id number] DOB: [birth year] Today's Date: 08/18/2011 Time: 7829-5621 PT Time Calculation (min): 22 min  PT Assessment / Plan / Recommendation Comments on Treatment Session  Patient progressing well today. Do not see need for inpatient rehab at this time as patient progressed with balance and safety this session. Patients wife is with him 24/7 and assisted him after previous CVA. Patient verbalized understanding of stroke signs and symptoms    Follow Up Recommendations  Home health PT    Barriers to Discharge        Equipment Recommendations  None recommended by PT    Recommendations for Other Services    Frequency Min 4X/week   Plan Discharge plan needs to be updated;Frequency remains appropriate    Precautions / Restrictions Precautions Precautions: Fall   Pertinent Vitals/Pain Denied pain    Mobility  Transfers Sit to Stand: 6: Modified independent (Device/Increase time) Stand to Sit: 6: Modified independent (Device/Increase time) Ambulation/Gait Ambulation/Gait Assistance: 5: Supervision Ambulation Distance (Feet): 250 Feet Assistive device: Rolling walker Ambulation/Gait Assistance Details: Patient with safe use of RW. No LOB or lean duing this session Gait Pattern: Decreased stride length;Step-through pattern Stairs: Yes Stairs Assistance: 5: Supervision Stairs Assistance Details (indicate cue type and reason): Cues for safety Stair Management Technique: Two rails Number of Stairs: 10  Modified Rankin (Stroke Patients Only) Pre-Morbid Rankin Score: No symptoms Modified Rankin: Moderate disability    Exercises     PT Diagnosis:    PT Problem List:   PT Treatment Interventions:     PT Goals Acute Rehab PT Goals PT Goal: Stand to Sit - Progress: Met PT Transfer Goal: Bed to Chair/Chair to Bed - Progress: Met PT Goal: Ambulate - Progress: Met Pt will Go Up / Down Stairs: Flight;with least  restrictive assistive device;with modified independence PT Goal: Up/Down Stairs - Progress: Progressing toward goal  Visit Information  Last PT Received On: 08/18/11 Assistance Needed: +1    Subjective Data  Subjective: I feel alot better   Cognition  Overall Cognitive Status: Appears within functional limits for tasks assessed/performed Arousal/Alertness: Awake/alert Orientation Level: Appears intact for tasks assessed Behavior During Session: [facility] for tasks performed    Balance     End of Session PT - End of Session Equipment Utilized During Treatment: Gait belt Activity Tolerance: Patient tolerated treatment well Patient left: in chair    Cloys Vera, Adline Potter 08/18/2011, 1:38 PM 08/18/2011 Fredrich Birks PTA [phone] pager [phone] office

## 2011-08-18 NOTE — Progress Notes (Signed)
Occupational Therapy Treatment Patient Details Name: Dylan Morgan MRN: [id number] DOB: 1945-06-20 Today's Date: 08/18/2011 Time: 4098-1191 OT Time Calculation (min): 21 min  OT Assessment / Plan / Recommendation Comments on Treatment Session Progressing well.    Follow Up Recommendations  No OT follow up    Barriers to Discharge       Equipment Recommendations  None recommended by OT    Recommendations for Other Services    Frequency     Plan Frequency needs to be updated    Precautions / Restrictions Precautions Precautions: Fall Restrictions Weight Bearing Restrictions: No   Pertinent Vitals/Pain Pt with no c/o pain    ADL  Lower Body Dressing: Performed;Supervision/safety;Set up Where Assessed - Lower Body Dressing:  (sit to stand from chair) Tub/Shower Transfer: Performed;Min guard Tub/Shower Transfer Method: Science writer:  (tub/shower) Equipment Used: Gait belt Transfers/Ambulation Related to ADLs: Attempted tub/shower transfer with forward entry and unilateral support on wall; pt with lateral LOB but able to catch self on far wall of tub. Educated pt to stand facing the wall for bilateral UE support. Also, pt not leaving enough room when stepping into/ out of shower for other foot to have room, putting him at risk for fall. Educated pt to "scoot" each foot over before stepping to allow room. Pt somewhat impulsive and forgetful of this instruction. performed x 4 before pt finally able to perform without VC. Wife present for session ADL Comments: Pt making good progress    OT Diagnosis:    OT Problem List:   OT Treatment Interventions:     OT Goals ADL Goals ADL Goal: Lower Body Bathing - Progress: Progressing toward goals ADL Goal: Lower Body Dressing - Progress: Progressing toward goals ADL Goal: Toilet Transfer - Progress: Progressing toward goals ADL Goal: Tub/Shower Transfer - Progress: Progressing toward goals  Visit  Information  Last OT Received On: 08/18/11 Assistance Needed: +1 PT/OT Co-Evaluation/Treatment: Yes    Subjective Data      Prior Functioning       Cognition  Overall Cognitive Status: Appears within functional limits for tasks assessed/performed Arousal/Alertness: Awake/alert Orientation Level: Appears intact for tasks assessed Behavior During Session: [facility] for tasks performed    Mobility Transfers Sit to Stand: 6: Modified independent (Device/Increase time) Stand to Sit: 6: Modified independent (Device/Increase time)   Exercises    Balance    End of Session OT - End of Session Equipment Utilized During Treatment: Gait belt Activity Tolerance: Patient tolerated treatment well Patient left: in bed;with call bell/phone within reach;with bed alarm set   Delanda Bulluck 08/18/2011, 2:51 PM

## 2011-08-18 NOTE — Progress Notes (Signed)
Subjective: No events overnight.  Feeling much improved this a.m. in terms of balance and function  Objective: Vital signs in last 24 hours: Filed Vitals:   08/18/11 0106 08/18/11 0515 08/18/11 0812 08/18/11 1000  BP: 117/81 109/68 103/75 108/68  Pulse: 53 41 54 51  Temp: 97 F (36.1 C) 97.5 F (36.4 C) 98.8 F (37.1 C) 97.1 F (36.2 C)  TempSrc: Oral Oral Oral Oral  Resp: 20 20 20 20   Height:      Weight: 241 lb 10 oz (109.6 kg)     SpO2: 95% 99% 98% 98%   Weight change: 1 lb 12.2 oz (0.8 kg)  Intake/Output Summary (Last 24 hours) at 08/18/11 1126 Last data filed at 08/18/11 0800  Gross per 24 hour  Intake 1758.75 ml  Output   1450 ml  Net 308.75 ml   GEN: NAD.  Alert and oriented x 3. Sleepy. EXT: warm and dry. No edema in b/l LE Neruo: 5/5 strength in bl/ finger extensors/flexors and bicep/tricep.  Numbness of R hand has subjectively improved.  Lab Results: Basic Metabolic Panel:  Lab 08/18/11 1610 08/17/11 0630 08/17/11 0218  NA 134* 137 --  K 4.4 4.2 --  CL 102 104 --  CO2 22 19 --  GLUCOSE 99 113* --  BUN 33* 46* --  CREATININE 1.54* 1.94* --  CALCIUM 9.0 9.4 --  MG -- -- 1.8  PHOS -- -- --   Liver Function Tests:  Lab 08/17/11 0218 08/16/11 2059  AST 34 35  ALT 22 24  ALKPHOS 77 82  BILITOT 0.4 0.3  PROT 7.2 7.6  ALBUMIN 3.7 4.0   CBC:  Lab 08/17/11 0630 08/16/11 2120 08/16/11 2059  WBC 4.2 -- 5.8  NEUTROABS -- -- 3.2  HGB 12.7* 13.3 --  HCT 37.3* 39.0 --  MCV 89.9 -- 88.7  PLT 123* -- 139*   Cardiac Enzymes:  Lab 08/17/11 0850 08/17/11 0218 08/16/11 2059  CKTOTAL 318* 348* 404*  CKMB 5.8* 6.0* 6.7*  CKMBINDEX -- -- --  TROPONINI <0.30 <0.30 <0.30   CBG:  Lab 08/16/11 2101  GLUCAP 110*   Hemoglobin A1C:  Lab 08/17/11 0218  HGBA1C 6.0*   Fasting Lipid Panel:  Lab 08/17/11 0630  CHOL 122  HDL 37*  LDLCALC 43  TRIG 960*  CHOLHDL 3.3  LDLDIRECT --   Coagulation:  Lab 08/18/11 0620 08/17/11 0630 08/16/11 2059    LABPROT 28.3* 28.8* 26.8*  INR 2.60* 2.66* 2.43*   Urine Drug Screen: Drugs of Abuse     Component Value Date/Time   LABOPIA NONE DETECTED 08/16/2011 2137   COCAINSCRNUR NONE DETECTED 08/16/2011 2137   LABBENZ NONE DETECTED 08/16/2011 2137   AMPHETMU NONE DETECTED 08/16/2011 2137   THCU NONE DETECTED 08/16/2011 2137   LABBARB NONE DETECTED 08/16/2011 2137    Alcohol Level: No results found for this basename: ETH:2 in the last 168 hours Urinalysis: No results found for this basename: COLORURINE:2,APPERANCEUR:2,LABSPEC:2,PHURINE:2,GLUCOSEU:2,HGBUR:2,BILIRUBINUR:2,KETONESUR:2,PROTEINUR:2,UROBILINOGEN:2,NITRITE:2,LEUKOCYTESUR:2 in the last 168 hours  Micro Results: Recent Results (from the past 240 hour(s))  MRSA PCR SCREENING     Status: Normal   Collection Time   08/17/11 11:18 AM      Component Value Range Status Comment   MRSA by PCR NEGATIVE  NEGATIVE  Final    Studies/Results: Ct Head Wo Contrast  08/16/2011  *RADIOLOGY REPORT*  Clinical Data: Code stroke.  Slurred speech with left arm weakness and left facial droop.  Left leg weakness.  CT HEAD WITHOUT CONTRAST  Technique:  Contiguous axial images were obtained from the base of the skull through the vertex without contrast.  Comparison: 01/24/2011.  Findings: No evidence of acute infarct, acute hemorrhage, mass lesion, mass effect or hydrocephalus.  There is patchy low attenuation in the periventricular white matter.  Mild atrophy. Visualized portions of the paranasal sinuses and mastoid air cells are clear.  IMPRESSION:  1.  No acute intracranial abnormality. These results were called by telephone on 08/16/2011  at  2119 hours to  Dr. Fonnie Jarvis, who verbally acknowledged these results. 2.  Atrophy and chronic microvascular white matter ischemic changes.  Original Report Authenticated By: Reyes Ivan, M.D.   Mri Brain Without Contrast  08/17/2011  *RADIOLOGY REPORT*  Clinical Data:  TIA.  Left-sided facial weakness and facial droop.  Slurred speech.  MRI HEAD WITHOUT CONTRAST MRA HEAD WITHOUT CONTRAST  Technique:  Multiplanar, multiecho pulse sequences of the brain and surrounding structures were obtained without intravenous contrast. Angiographic images of the head were obtained using MRA technique without contrast.  Comparison:  CT head 08/16/2011.  MRI brain 01/01/2011.  MRI HEAD  Findings:  Focal restricted diffusion is evident in the posterior right carinae corona radiata.  T2 and FLAIR signal is associated.  No hemorrhage or mass lesion is present.  Atrophy and moderate white matter disease is otherwise stable.  The left internal carotid artery is occluded below the posterior communicating artery.  Flow is present in the right internal carotid artery and vertebral arteries bilaterally.  The patient is status post scleral banding on the left.  A lens extraction has been performed on the right.  Mild mucosal thickening is noted in the anterior ethmoid air cells and maxillary sinuses.  The paranasal sinuses are otherwise clear.  There is some fluid in the mastoid air cells.  No obstructing nasopharyngeal lesion is evident.  IMPRESSION:  1.  Acute non hemorrhagic infarct of the posterior right corona radiata. 2.  Occluded left internal carotid artery, a chronic finding. 3.  Moderate atrophy and extensive white matter disease is otherwise stable.  MRA HEAD  Findings: The left internal carotid artery is chronically occluded. It is reconstituted at the level of the posterior communicating artery.  The right internal carotid artery is within normal limits. The right posterior communicating artery is patent.  The anterior communicating artery is patent.  The ACA branch vessels are within normal limits.  The MCA bifurcations are within normal limits bilaterally.  There is some tapering of distal MCA branch vessels, more apparent on the left.  The left vertebral artery is the dominant vessel.  The PICA origins are visualized and within normal limits  bilaterally.  A prominent left AICA is also noted.  The basilar artery is within normal limits.  The left posterior cerebral artery originates from basilar tip. The right posterior cerebral artery is predominately of fetal type with a small right P1 segment.  There is some attenuation of distal PCA branch vessels, more prominent on the left.  IMPRESSION:  1.  Occluded left internal carotid artery is reconstituted at the level of the posterior communicating artery. 2.  Moderate small vessel attenuation anteriorly and posteriorly is worse on the left.  This is likely a flow phenomenon anteriorly.  Original Report Authenticated By: Jamesetta Orleans. MATTERN, M.D.   Mr Maxine Glenn Head/brain Wo Cm  08/17/2011  *RADIOLOGY REPORT*  Clinical Data:  TIA.  Left-sided facial weakness and facial droop. Slurred speech.  MRI HEAD WITHOUT CONTRAST MRA HEAD WITHOUT CONTRAST  Technique:  Multiplanar, multiecho pulse sequences of the brain and surrounding structures were obtained without intravenous contrast. Angiographic images of the head were obtained using MRA technique without contrast.  Comparison:  CT head 08/16/2011.  MRI brain 01/01/2011.  MRI HEAD  Findings:  Focal restricted diffusion is evident in the posterior right carinae corona radiata.  T2 and FLAIR signal is associated.  No hemorrhage or mass lesion is present.  Atrophy and moderate white matter disease is otherwise stable.  The left internal carotid artery is occluded below the posterior communicating artery.  Flow is present in the right internal carotid artery and vertebral arteries bilaterally.  The patient is status post scleral banding on the left.  A lens extraction has been performed on the right.  Mild mucosal thickening is noted in the anterior ethmoid air cells and maxillary sinuses.  The paranasal sinuses are otherwise clear.  There is some fluid in the mastoid air cells.  No obstructing nasopharyngeal lesion is evident.  IMPRESSION:  1.  Acute non hemorrhagic  infarct of the posterior right corona radiata. 2.  Occluded left internal carotid artery, a chronic finding. 3.  Moderate atrophy and extensive white matter disease is otherwise stable.  MRA HEAD  Findings: The left internal carotid artery is chronically occluded. It is reconstituted at the level of the posterior communicating artery.  The right internal carotid artery is within normal limits. The right posterior communicating artery is patent.  The anterior communicating artery is patent.  The ACA branch vessels are within normal limits.  The MCA bifurcations are within normal limits bilaterally.  There is some tapering of distal MCA branch vessels, more apparent on the left.  The left vertebral artery is the dominant vessel.  The PICA origins are visualized and within normal limits bilaterally.  A prominent left AICA is also noted.  The basilar artery is within normal limits.  The left posterior cerebral artery originates from basilar tip. The right posterior cerebral artery is predominately of fetal type with a small right P1 segment.  There is some attenuation of distal PCA branch vessels, more prominent on the left.  IMPRESSION:  1.  Occluded left internal carotid artery is reconstituted at the level of the posterior communicating artery. 2.  Moderate small vessel attenuation anteriorly and posteriorly is worse on the left.  This is likely a flow phenomenon anteriorly.  Original Report Authenticated By: Jamesetta Orleans. MATTERN, M.D.   Medications: I have reviewed the patient's current medications. Scheduled Meds:    . lisinopril  10 mg Oral Daily  . simvastatin  10 mg Oral QHS  . warfarin  5 mg Oral q1800  . Warfarin - Pharmacist Dosing Inpatient   Does not apply q1800  . DISCONTD: aspirin EC  81 mg Oral Daily   Continuous Infusions:    . sodium chloride 75 mL/hr at 08/18/11 0728   PRN Meds:.acetaminophen, ondansetron (ZOFRAN) IV, ondansetron  Assessment/Plan: # R corona radiata stroke MRI  shows acute non hemorrhagic infarct.  No focal weakness Exam does not show focal weakness but pt still has subjective numbness in L hand.  MRI shows con't stenosis of L ICA.  Doppler still pending for eval of R side.  Per neuro rec's, con't coumadin and await U/S results. - con't coumadin - no ASA per neuro given bleeding risks outweigh cardiovascular benefits (nearly 10 years removed from CABG) - appreciate neuro rec's - f/u carotid doppler - possible clinical trial placement per neuro - a1c pending - PT/OT  consults   # HTN Controlled on ACE. - con't lisinopril 10mg  qd  # Acute on chronic CKD Baseline Cr is 1.3-1.5, now 1.5 down from 2.7.  - encourage po intake]  # Hyperlipidemia Well controlled with LDL of 43.  TG is 211, but is improved from 297. Last LDL on 68, total chol 160, HDL 33, except forTriglyceride 297 in 12/2010. He is currently taking Simvastatin 10mg  as outpatient. Of note, patient reports severe cramping of his legs, which could represent statin myopathy vs claudication.  However, lipid control is critical in this vasculopathic pt - con't statin - consider fibrate for TG level   # Hx CAD: s/p CABG in 2004. Stable, denies any chest pain at this time. Patient is not on BB due to his bradycardia. Per Dr. Henrietta Hoover note, patient may need pacemaker in the future.  - no ASA given bleeding risk  # Chronic back pain: s/p mechanical fall in the past. Patient has been managed with Tylenol as outpatient and avoid NSAIDs due to his CKD. Will continue Tylenol for now and if not well controlled, may add stronger pain med   # DVT ppx: Warfarin for paroxysmal afib  # Dispo: PCP Margorie John   LOS: 2 days   WILDMAN-TOBRINER, BEN 08/18/2011, 11:26 AM

## 2011-08-18 NOTE — Progress Notes (Signed)
Internal Medicine Attending  Date: 08/18/2011  Patient name: Dylan Morgan Medical record number: [id number] Date of birth: 12/17/1945 Age: 66 y.o. Gender: male  I saw and evaluated the patient. I reviewed the resident's note by Dr. Abner Greenspan and I agree with the resident's findings and plans as documented in his progress note.   Mr. Swindell feels more steady on his feet today. He was evaluated by physical therapy this morning and they no longer feel he requires inpatient rehabilitation. We spent considerable time explaining to him why a carotid endarterectomy would not be in his best interest at this time given the risk benefit ratio of the surgery and its potential to prevent further strokes. I agree with the housestaff's plan to discharge Mr. Markman home today with followup in his primary care provider's office.

## 2011-08-18 NOTE — Progress Notes (Signed)
Agree with disposition change  08/18/2011 Milana Kidney DPT PAGER: [phone] OFFICE: [phone]

## 2011-08-21 ENCOUNTER — Telehealth: Payer: Self-pay | Admitting: *Deleted

## 2011-08-21 NOTE — Telephone Encounter (Signed)
Call from [place], Physical Therapist with [facility] # [phone] She has called pt several times with no response.  They went to home on Sunday and pt not at home. Today she called again and pt refused services at this time.  Pt states he is bathing himself and doing fine on his own. Please call if you have any questions. Pt has been discharged from [facility]

## 2011-08-22 ENCOUNTER — Encounter: Payer: Self-pay | Admitting: Cardiology

## 2011-08-22 ENCOUNTER — Encounter: Payer: Medicare Other | Admitting: Ophthalmology

## 2011-08-23 ENCOUNTER — Ambulatory Visit: Payer: Medicare Other | Attending: Ophthalmology | Admitting: Physical Therapy

## 2011-08-23 ENCOUNTER — Ambulatory Visit (INDEPENDENT_AMBULATORY_CARE_PROVIDER_SITE_OTHER): Payer: Medicare Other | Admitting: Cardiology

## 2011-08-23 ENCOUNTER — Ambulatory Visit (INDEPENDENT_AMBULATORY_CARE_PROVIDER_SITE_OTHER): Payer: Medicare Other | Admitting: *Deleted

## 2011-08-23 ENCOUNTER — Encounter: Payer: Self-pay | Admitting: Cardiology

## 2011-08-23 VITALS — BP 122/88 | HR 56 | Ht 71.0 in | Wt 248.8 lb

## 2011-08-23 DIAGNOSIS — I480 Paroxysmal atrial fibrillation: Secondary | ICD-10-CM

## 2011-08-23 DIAGNOSIS — M545 Low back pain, unspecified: Secondary | ICD-10-CM | POA: Insufficient documentation

## 2011-08-23 DIAGNOSIS — I6350 Cerebral infarction due to unspecified occlusion or stenosis of unspecified cerebral artery: Secondary | ICD-10-CM

## 2011-08-23 DIAGNOSIS — I639 Cerebral infarction, unspecified: Secondary | ICD-10-CM

## 2011-08-23 DIAGNOSIS — I2510 Atherosclerotic heart disease of native coronary artery without angina pectoris: Secondary | ICD-10-CM

## 2011-08-23 DIAGNOSIS — Z7901 Long term (current) use of anticoagulants: Secondary | ICD-10-CM

## 2011-08-23 DIAGNOSIS — I4891 Unspecified atrial fibrillation: Secondary | ICD-10-CM

## 2011-08-23 DIAGNOSIS — I779 Disorder of arteries and arterioles, unspecified: Secondary | ICD-10-CM

## 2011-08-23 DIAGNOSIS — IMO0001 Reserved for inherently not codable concepts without codable children: Secondary | ICD-10-CM | POA: Insufficient documentation

## 2011-08-23 LAB — POCT INR: INR: 2.3

## 2011-08-23 NOTE — Assessment & Plan Note (Signed)
The patient was hospitalized for 2 days with a corona radiata stroke on Aug 16, 2011. He does have significant carotid disease. It was felt that this was not the basis of the problem. He improved. He is continued on his Coumadin.

## 2011-08-23 NOTE — Assessment & Plan Note (Signed)
Patient's rhythm has been stable. He is on Coumadin for this problem.

## 2011-08-23 NOTE — Assessment & Plan Note (Signed)
The patient has significant carotid disease. There is old total occlusion of the left internal carotid artery. Previously it was felt that there was not significant disease of the right. His recent Doppler in May, 2013 reveals 40-59% right internal carotid artery stenosis. He needs a 6 month followup Doppler. We will pay careful attention to his Doppler studies over time.

## 2011-08-23 NOTE — Assessment & Plan Note (Signed)
Coronary disease is stable. No change in therapy. 

## 2011-08-23 NOTE — Patient Instructions (Addendum)
Your physician recommends that you schedule a follow-up appointment in: 3 months with Dr. Myrtis Ser. Your physician has requested that you have a carotid duplex. This test is an ultrasound of the carotid arteries in your neck. It looks at blood flow through these arteries that supply the brain with blood. Allow one hour for this exam. There are no restrictions or special instructions.Test to be done in November 2013,  F/U the Carotid duplex done at [facility] in May 2013.  Your physician recommends that you continue on your current medications as directed. Please refer to the Current Medication list given to you today.

## 2011-08-23 NOTE — Progress Notes (Signed)
HPI  Patient is seen today to followup his coronary disease. He is also seen to followup her recent hospitalization. Recently the patient was hospitalized. He did have a CVA. It is described as a corona radiata stroke on the right. He does have carotid disease. The stroke was not felt to be related to his carotid disease. He does have a cold total occlusion of his left internal carotid artery. He has moderate disease of the right. It is possible that the right disease has changed over time but it is not severe. He is to be followed.  As part of today's evaluation I have reviewed the hospital records extensively including the notes and x-rays and consult notes.  Allergies  Allergen Reactions  . Diltiazem Hcl Other (See Comments)    unknown  . Oxycodone-Acetaminophen Other (See Comments)    unknown    Current Outpatient Prescriptions  Medication Sig Dispense Refill  . acetaminophen (TYLENOL) 325 MG tablet Take 2 tablets (650 mg total) by mouth every 4 (four) hours as needed for pain.  100 tablet  2  . lisinopril (PRINIVIL,ZESTRIL) 10 MG tablet Take 10 mg by mouth daily.       . Multiple Vitamins-Minerals (MULTIVITAMIN PO) Take 1 tablet by mouth daily.       . sildenafil (VIAGRA) 100 MG tablet Take 1 tablet (100 mg total) by mouth as needed for erectile dysfunction.  6 tablet  5  . simvastatin (ZOCOR) 10 MG tablet Take 1 tablet (10 mg total) by mouth at bedtime.  90 tablet  4  . vitamin E 1000 UNIT capsule Take 1,000 Units by mouth daily.        Marland Kitchen warfarin (COUMADIN) 5 MG tablet Take 5 mg by mouth daily.        History   Social History  . Marital Status: Single    Spouse Name: N/A    Number of Children: N/A  . Years of Education: N/A   Occupational History  . disabled    Social History Main Topics  . Smoking status: Former Smoker    Quit date: 02/03/2010  . Smokeless tobacco: Never Used  . Alcohol Use: No  . Drug Use: No  . Sexually Active: No   Other Topics Concern  .  Not on file   Social History Narrative   Lives with son and his wife.Divorced.Regular exercise.    Family History  Problem Relation Age of Onset  . Diabetes Mother   . Stroke Father   . Cancer Sister     thyroid cancer  . Heart disease Brother     Coronary artery disease    Past Medical History  Diagnosis Date  . CAD (coronary artery disease) CABG in 2004    F/U by Dr Genia Harold cardiology, cardiolite scan done elsewhere in October 2010 that was normal, no ischemia seen, EF 55-65% in 02/2005  . GERD (gastroesophageal reflux disease)   . Hypertension   . Hyperlipidemia   . Renal insufficiency   . Cellulitis and abscess of leg     right leg  . Tobacco abuse   . Chronic back pain   . Chronic neck pain   . Seborrheic dermatitis of scalp   . Abnormal LFTs     Hx of abnormal LFT's  . Bradycardia   . AAA (abdominal aortic aneurysm)     November 2010, Stenting, November, 2010  . Paroxysmal atrial fibrillation 02/22/2009    diltiazem po started but discontinued due to side  effects.  . Stroke   . Ejection fraction     EF 40% in the past  /   EF 55-65% echo, November, 2006  . Hx of CABG     2004  . Carotid artery disease     Doppler, October, 2012, and old LICA occlusion , RICA no significant abnormality  . Arthritis   . Prediabetes   . CVA (cerebral infarction)     R Carona radiata stroke 08/16/2011    Past Surgical History  Procedure Date  . Coronary artery bypass graft 2004  . Rotator cuff repair     ROS  Patient denies fever, chills, headache, sweats, rash, change in vision, change in hearing, chest pain, cough, nausea vomiting, urinary symptoms. All other systems are reviewed and are negative.  PHYSICAL EXAM  Patient is overweight. He is oriented to person time and place. Affect is normal. There is no jugulovenous distention. Lungs are clear. Respiratory effort is nonlabored. Cardiac exam is S1 and S2. There no clicks or significant murmurs. The abdomen is  soft. There is no peripheral edema. There no musculoskeletal deformities. There are no skin rashes.  Filed Vitals:   08/23/11 1056  BP: 122/88  Pulse: 56  Height: 5\' 11"  (1.803 m)  Weight: 248 lb 12.8 oz (112.855 kg)     ASSESSMENT & PLAN

## 2011-08-24 NOTE — Telephone Encounter (Signed)
Thank you :)

## 2011-09-01 ENCOUNTER — Encounter: Payer: Medicare Other | Admitting: Ophthalmology

## 2011-09-05 ENCOUNTER — Other Ambulatory Visit: Payer: Self-pay | Admitting: *Deleted

## 2011-09-05 ENCOUNTER — Encounter: Payer: Medicare Other | Admitting: Physical Therapy

## 2011-09-06 MED ORDER — SIMVASTATIN 10 MG PO TABS: 10.0000 mg | ORAL_TABLET | Freq: Every day | ORAL | Status: DC

## 2011-09-06 MED ORDER — LISINOPRIL 10 MG PO TABS: 10.0000 mg | ORAL_TABLET | Freq: Every day | ORAL | Status: DC

## 2011-09-08 ENCOUNTER — Encounter: Payer: Medicare Other | Admitting: Ophthalmology

## 2011-09-11 ENCOUNTER — Ambulatory Visit (INDEPENDENT_AMBULATORY_CARE_PROVIDER_SITE_OTHER): Payer: Medicare Other

## 2011-09-11 DIAGNOSIS — Z7901 Long term (current) use of anticoagulants: Secondary | ICD-10-CM

## 2011-09-11 DIAGNOSIS — I639 Cerebral infarction, unspecified: Secondary | ICD-10-CM

## 2011-09-11 DIAGNOSIS — I4891 Unspecified atrial fibrillation: Secondary | ICD-10-CM

## 2011-09-11 DIAGNOSIS — I480 Paroxysmal atrial fibrillation: Secondary | ICD-10-CM

## 2011-09-11 DIAGNOSIS — I6350 Cerebral infarction due to unspecified occlusion or stenosis of unspecified cerebral artery: Secondary | ICD-10-CM

## 2011-09-11 LAB — POCT INR: INR: 1.9

## 2011-09-12 ENCOUNTER — Encounter: Payer: Medicare Other | Admitting: Physical Therapy

## 2011-09-14 ENCOUNTER — Inpatient Hospital Stay (HOSPITAL_COMMUNITY)
Admission: EM | Admit: 2011-09-14 | Discharge: 2011-09-16 | DRG: 683 | Disposition: A | Discharge status: Home / Self Care | Payer: Medicare Other | Source: Ambulatory Visit | Attending: Infectious Disease | Admitting: Infectious Disease

## 2011-09-14 ENCOUNTER — Encounter (HOSPITAL_COMMUNITY): Payer: Self-pay | Admitting: *Deleted

## 2011-09-14 ENCOUNTER — Emergency Department (INDEPENDENT_AMBULATORY_CARE_PROVIDER_SITE_OTHER)
Admission: EM | Admit: 2011-09-14 | Discharge: 2011-09-14 | Disposition: A | Discharge status: Nursing Home (Includes ICF) | Payer: Medicare Other | Source: Home / Self Care

## 2011-09-14 ENCOUNTER — Emergency Department (HOSPITAL_COMMUNITY): Payer: Medicare Other

## 2011-09-14 DIAGNOSIS — L0291 Cutaneous abscess, unspecified: Secondary | ICD-10-CM

## 2011-09-14 DIAGNOSIS — E785 Hyperlipidemia, unspecified: Secondary | ICD-10-CM | POA: Diagnosis present

## 2011-09-14 DIAGNOSIS — I10 Essential (primary) hypertension: Secondary | ICD-10-CM | POA: Diagnosis present

## 2011-09-14 DIAGNOSIS — R7309 Other abnormal glucose: Secondary | ICD-10-CM | POA: Diagnosis present

## 2011-09-14 DIAGNOSIS — Z8673 Personal history of transient ischemic attack (TIA), and cerebral infarction without residual deficits: Secondary | ICD-10-CM

## 2011-09-14 DIAGNOSIS — N179 Acute kidney failure, unspecified: Principal | ICD-10-CM | POA: Diagnosis present

## 2011-09-14 DIAGNOSIS — I4891 Unspecified atrial fibrillation: Secondary | ICD-10-CM | POA: Diagnosis present

## 2011-09-14 DIAGNOSIS — I959 Hypotension, unspecified: Secondary | ICD-10-CM

## 2011-09-14 DIAGNOSIS — I2510 Atherosclerotic heart disease of native coronary artery without angina pectoris: Secondary | ICD-10-CM | POA: Diagnosis present

## 2011-09-14 DIAGNOSIS — G4733 Obstructive sleep apnea (adult) (pediatric): Secondary | ICD-10-CM | POA: Diagnosis present

## 2011-09-14 DIAGNOSIS — R7303 Prediabetes: Secondary | ICD-10-CM | POA: Diagnosis present

## 2011-09-14 DIAGNOSIS — N183 Chronic kidney disease, stage 3 unspecified: Secondary | ICD-10-CM | POA: Diagnosis present

## 2011-09-14 DIAGNOSIS — I639 Cerebral infarction, unspecified: Secondary | ICD-10-CM | POA: Diagnosis present

## 2011-09-14 DIAGNOSIS — F17200 Nicotine dependence, unspecified, uncomplicated: Secondary | ICD-10-CM | POA: Diagnosis present

## 2011-09-14 DIAGNOSIS — I779 Disorder of arteries and arterioles, unspecified: Secondary | ICD-10-CM | POA: Diagnosis present

## 2011-09-14 DIAGNOSIS — L03115 Cellulitis of right lower limb: Secondary | ICD-10-CM | POA: Diagnosis present

## 2011-09-14 DIAGNOSIS — N289 Disorder of kidney and ureter, unspecified: Secondary | ICD-10-CM

## 2011-09-14 DIAGNOSIS — I129 Hypertensive chronic kidney disease with stage 1 through stage 4 chronic kidney disease, or unspecified chronic kidney disease: Secondary | ICD-10-CM | POA: Diagnosis present

## 2011-09-14 DIAGNOSIS — Z951 Presence of aortocoronary bypass graft: Secondary | ICD-10-CM

## 2011-09-14 DIAGNOSIS — I498 Other specified cardiac arrhythmias: Secondary | ICD-10-CM | POA: Diagnosis present

## 2011-09-14 DIAGNOSIS — L02419 Cutaneous abscess of limb, unspecified: Secondary | ICD-10-CM | POA: Diagnosis present

## 2011-09-14 DIAGNOSIS — R0602 Shortness of breath: Secondary | ICD-10-CM

## 2011-09-14 DIAGNOSIS — K219 Gastro-esophageal reflux disease without esophagitis: Secondary | ICD-10-CM | POA: Diagnosis present

## 2011-09-14 DIAGNOSIS — Z7901 Long term (current) use of anticoagulants: Secondary | ICD-10-CM

## 2011-09-14 DIAGNOSIS — N182 Chronic kidney disease, stage 2 (mild): Secondary | ICD-10-CM | POA: Diagnosis present

## 2011-09-14 DIAGNOSIS — R001 Bradycardia, unspecified: Secondary | ICD-10-CM | POA: Diagnosis present

## 2011-09-14 DIAGNOSIS — L0390 Cellulitis, unspecified: Secondary | ICD-10-CM

## 2011-09-14 DIAGNOSIS — Z79899 Other long term (current) drug therapy: Secondary | ICD-10-CM

## 2011-09-14 LAB — CBC
MCH: 30.4 pg (ref 26.0–34.0)
MCHC: 34 g/dL (ref 30.0–36.0)
MCV: 89.5 fL (ref 78.0–100.0)
Platelets: 162 10*3/uL (ref 150–400)
RDW: 13.9 % (ref 11.5–15.5)
WBC: 7.1 10*3/uL (ref 4.0–10.5)

## 2011-09-14 LAB — POCT I-STAT, CHEM 8
Calcium, Ion: 1.12 mmol/L (ref 1.12–1.32)
Glucose, Bld: 103 mg/dL — ABNORMAL HIGH (ref 70–99)
HCT: 39 % (ref 39.0–52.0)
TCO2: 20 mmol/L (ref 0–100)

## 2011-09-14 LAB — TROPONIN I: Troponin I: <0.3 ng/mL (ref <0.30)

## 2011-09-14 LAB — DIFFERENTIAL
Basophils Absolute: 0 10*3/uL (ref 0.0–0.1)
Eosinophils Absolute: 0.1 10*3/uL (ref 0.0–0.7)
Eosinophils Relative: 1 % (ref 0–5)
Monocytes Absolute: 0.9 10*3/uL (ref 0.1–1.0)

## 2011-09-14 LAB — PROTIME-INR: Prothrombin Time: 28.5 seconds — ABNORMAL HIGH (ref 11.6–15.2)

## 2011-09-14 MED ORDER — SODIUM CHLORIDE 0.9 % IV SOLN
Freq: Once | INTRAVENOUS | Status: AC
  Administered 2011-09-14: 19:00:00 via INTRAVENOUS

## 2011-09-14 MED ORDER — CLINDAMYCIN PHOSPHATE 600 MG/50ML IV SOLN
INTRAVENOUS | Status: AC
  Filled 2011-09-14: qty 50

## 2011-09-14 MED ORDER — CLINDAMYCIN PHOSPHATE 600 MG/50ML IV SOLN: 600.0000 mg | Freq: Once | INTRAVENOUS | Status: DC

## 2011-09-14 MED ORDER — MORPHINE SULFATE 4 MG/ML IJ SOLN
4.0000 mg | Freq: Once | INTRAMUSCULAR | Status: AC
  Administered 2011-09-14: 4 mg via INTRAVENOUS

## 2011-09-14 MED ORDER — ONDANSETRON HCL 4 MG/2ML IJ SOLN
4.0000 mg | Freq: Once | INTRAMUSCULAR | Status: AC
  Administered 2011-09-14: 4 mg via INTRAVENOUS
  Filled 2011-09-14: qty 2

## 2011-09-14 MED ORDER — SODIUM CHLORIDE 0.9 % IV BOLUS (SEPSIS)
1000.0000 mL | Freq: Once | INTRAVENOUS | Status: AC
  Administered 2011-09-15: 1000 mL via INTRAVENOUS

## 2011-09-14 MED ORDER — CEFAZOLIN SODIUM 1-5 GM-% IV SOLN
1.0000 g | Freq: Once | INTRAVENOUS | Status: AC
  Administered 2011-09-14: 1 g via INTRAVENOUS
  Filled 2011-09-14: qty 50

## 2011-09-14 MED ORDER — MORPHINE SULFATE 4 MG/ML IJ SOLN
INTRAMUSCULAR | Status: AC
  Filled 2011-09-14: qty 1

## 2011-09-14 MED ORDER — SODIUM CHLORIDE 0.9 % IV BOLUS (SEPSIS)
1000.0000 mL | Freq: Once | INTRAVENOUS | Status: AC
  Administered 2011-09-14: 1000 mL via INTRAVENOUS

## 2011-09-14 NOTE — ED Provider Notes (Signed)
History     CSN: [id number]  Arrival date & time 09/14/11  1931   First MD Initiated Contact with Patient 09/14/11 1938      Chief Complaint  Patient presents with  . Cellulitis    (Consider location/radiation/quality/duration/timing/severity/associated sxs/prior treatment) HPI  66 year old male with significant history of cardiac disease, cerebrovascular disease, and proximal atrial fibrillation he was sent to the ED for further management of his right leg cellulitis with associated hypertension and shortness of breath.    Per Urgent care note, patient develops pain and redness to his right shin for the past 2 days. He has had recurrent cellulitis to the same area and was treated with Keflex.  Denies any fever, chills, or increasing pain to affected area.    Pt also c/o sob with exertion which started yesterday. C/o tingling sensation across chest without CP, or palpitations.    Patient reports that he has had infection to his right lower leg several times in the past. Most recent was a year and a half ago. He has noticed increased redness for the past 2 days. Does complain of some sore sensation to the affected area. He denies itchiness, swelling, or numbness. He denies any recent trauma. He denies history of diabetes. Patient also mentioned that his shortness of breath only happens when he is out in the heat. He denies any shortness of breath or chest pain at this time.  Pt sts he quit smoking 3 years ago.    Past Medical History  Diagnosis Date  . CAD (coronary artery disease) CABG in 2004    F/U by Dr Genia Harold cardiology, cardiolite scan done elsewhere in October 2010 that was normal, no ischemia seen, EF 55-65% in 02/2005  . GERD (gastroesophageal reflux disease)   . Hypertension   . Hyperlipidemia   . Renal insufficiency   . Cellulitis and abscess of leg     right leg  . Tobacco abuse   . Chronic back pain   . Chronic neck pain   . Seborrheic dermatitis of scalp   .  Abnormal LFTs     Hx of abnormal LFT's  . Bradycardia   . AAA (abdominal aortic aneurysm)     November 2010, Stenting, November, 2010  . Paroxysmal atrial fibrillation 02/22/2009    diltiazem po started but discontinued due to side effects.  . Stroke   . Ejection fraction     EF 40% in the past  /   EF 55-65% echo, November, 2006  . Hx of CABG     2004  . Carotid artery disease     Doppler, October, 2012, and old LICA occlusion , RICA no significant abnormality  . Arthritis   . Prediabetes   . CVA (cerebral infarction)     R Carona radiata stroke 08/16/2011    Past Surgical History  Procedure Date  . Coronary artery bypass graft 2004  . Rotator cuff repair     Family History  Problem Relation Age of Onset  . Diabetes Mother   . Stroke Father   . Cancer Sister     thyroid cancer  . Heart disease Brother     Coronary artery disease    History  Substance Use Topics  . Smoking status: Former Smoker    Quit date: 02/03/2010  . Smokeless tobacco: Never Used  . Alcohol Use: No      Review of Systems  All other systems reviewed and are negative.    Allergies  Diltiazem hcl and Oxycodone-acetaminophen  Home Medications   Current Outpatient Rx  Name Route Sig Dispense Refill  . ACETAMINOPHEN 325 MG PO TABS Oral Take 2 tablets (650 mg total) by mouth every 4 (four) hours as needed for pain. 100 tablet 2  . LISINOPRIL 10 MG PO TABS Oral Take 1 tablet (10 mg total) by mouth daily. 60 tablet 3  . MULTIVITAMIN PO Oral Take 1 tablet by mouth daily.     Marland Kitchen SILDENAFIL CITRATE 100 MG PO TABS Oral Take 1 tablet (100 mg total) by mouth as needed for erectile dysfunction. 6 tablet 5  . SIMVASTATIN 10 MG PO TABS Oral Take 1 tablet (10 mg total) by mouth at bedtime. 90 tablet 4  . VITAMIN E 1000 UNITS PO CAPS Oral Take 1,000 Units by mouth daily.      . WARFARIN SODIUM 5 MG PO TABS Oral Take 5 mg by mouth daily.      BP 106/67  Pulse 65  Temp 98.2 F (36.8 C) (Oral)  Resp  20  SpO2 99%  Physical Exam  Nursing note and vitals reviewed. Constitutional: He is oriented to person, place, and time. He appears well-developed and well-nourished. No distress.       Awake, alert, nontoxic appearance  HENT:  Head: Atraumatic.  Eyes: Conjunctivae are normal. Right eye exhibits no discharge. Left eye exhibits no discharge.  Neck: Normal range of motion. Neck supple.  Cardiovascular: Normal rate and regular rhythm.   Pulmonary/Chest: Effort normal. No respiratory distress. He exhibits no tenderness.       Mild scattered wheezes heard throughout. No rhonchi or rales noted. NO accessory muscle use.   Abdominal: Soft. There is no tenderness. There is no rebound.  Musculoskeletal: He exhibits no tenderness.       ROM appears intact, no obvious focal weakness  Neurological: He is alert and oriented to person, place, and time.  Skin: Skin is warm and dry. Rash noted.       R lower extremity.  4'x7' (inches) erythematous base with demarcated margin, without induration or fluctuance.  Blanchable.  No petechia, or pustule.  Mildly tender on palpation.  Sensation intact.  No pedal edema.  Pedal pulse 2+.  Toe fungus on all toes.    Psychiatric: He has a normal mood and affect.    ED Course  Procedures (including critical care time)  Labs Reviewed - No data to display No results found.   No diagnosis found.   Date: 09/14/2011  Rate: 47  Rhythm: sinus bradycardia  QRS Axis: left  Intervals: normal  ST/T Wave abnormalities: nonspecific T wave changes  Conduction Disutrbances:left anterior fascicular block  Narrative Interpretation:   Old EKG Reviewed: no significant acute changes  Results for orders placed during the hospital encounter of 09/14/11  CBC      Component Value Range   WBC 7.1  4.0 - 10.5 K/uL   RBC 4.27  4.22 - 5.81 MIL/uL   Hemoglobin 13.0  13.0 - 17.0 g/dL   HCT 16.1 (*) 09.6 - 04.5 %   MCV 89.5  78.0 - 100.0 fL   MCH 30.4  26.0 - 34.0 pg   MCHC  34.0  30.0 - 36.0 g/dL   RDW 40.9  81.1 - 91.4 %   Platelets 162  150 - 400 K/uL  DIFFERENTIAL      Component Value Range   Neutrophils Relative 57  43 - 77 %   Neutro Abs 4.1  1.7 -  7.7 K/uL   Lymphocytes Relative 28  12 - 46 %   Lymphs Abs 2.0  0.7 - 4.0 K/uL   Monocytes Relative 13 (*) 3 - 12 %   Monocytes Absolute 0.9  0.1 - 1.0 K/uL   Eosinophils Relative 1  0 - 5 %   Eosinophils Absolute 0.1  0.0 - 0.7 K/uL   Basophils Relative 0  0 - 1 %   Basophils Absolute 0.0  0.0 - 0.1 K/uL  TROPONIN I      Component Value Range   Troponin I <0.30  <0.30 ng/mL  POCT I-STAT, CHEM 8      Component Value Range   Sodium 139  135 - 145 mEq/L   Potassium 4.1  3.5 - 5.1 mEq/L   Chloride 108  96 - 112 mEq/L   BUN 61 (*) 6 - 23 mg/dL   Creatinine, Ser 1.61 (*) 0.50 - 1.35 mg/dL   Glucose, Bld 096 (*) 70 - 99 mg/dL   Calcium, Ion 0.45  4.09 - 1.32 mmol/L   TCO2 20  0 - 100 mmol/L   Hemoglobin 13.3  13.0 - 17.0 g/dL   HCT 81.1  91.4 - 78.2 %  PROTIME-INR      Component Value Range   Prothrombin Time 28.5 (*) 11.6 - 15.2 seconds   INR 2.63 (*) 0.00 - 1.49   Dg Chest 2 View  09/14/2011  *RADIOLOGY REPORT*  Clinical Data: Wheezing and shortness of breath.  Nonsmoker.  CHEST - 2 VIEW  Comparison: 03/01/2009  Findings: Hyperinflation. Prior median sternotomy.  Midline trachea.  Mild cardiomegaly with a tortuous descending thoracic aorta. No pleural effusion or pneumothorax.  Diffuse peribronchial thickening.  Cannot exclude nodule projecting over the left upper lobe.  This is in the region of summation of osseous shadows. No lobar consolidation.  IMPRESSION:  1.  Cardiomegaly and chronic interstitial thickening, without acute disease. 2.  Cannot exclude left upper lobe lung nodule.  This is in the region of the osseous summation shadows.  Consider non emergent chest CT.  These results were called by telephone on 09/14/2011  at  10:29 p.m. to  Linwood Dibbles, who verbally acknowledged these results.   Original Report Authenticated By: Consuello Bossier, M.D.      1. Cellulitis of right anterior lower leg 2. Renal insufficiency 3. Hypotension     MDM  R lower leg cellulitis.  Will give 1g of Ancef.  Will obtain ECG, CXR, trop, and basic labs for further evaluation of SOB.  My attending has seen and evaluate the pt and agrees with plan.    11:56 PM CXR shows cardiomegaly and chronic interstitial thickening without acute disease.  Xray cannot exclude L upper lobe lung nodule, and recommend non emergent chest CT.    Pt's lab significant for BUN 60, Cr 4.41.  Pt admits to not drinking enough water at home, but sts he still make urine.  Denies prior hx of kidney disease.  Pt otherwise still maintain a low bp at 80s systolic despite 2L NS.  Doubt sepsis.  Pt does not look toxic.  Pt has normal orthostatic VS.    12:18 AM I have consulted with [facility]PC, Dr. Elberta Spaniel who agrees to see pt in ED for admission.  Pt to be admit on tele bed, under the care of Dr. Daiva Eves.     Fayrene Helper, PA-C 09/15/11 0022

## 2011-09-14 NOTE — ED Notes (Signed)
[facility] notified of transfer to ED.

## 2011-09-14 NOTE — ED Notes (Signed)
Pt reports cellulitis that started 3 days ago - hx of same - redness, hot, tender to touch

## 2011-09-14 NOTE — ED Notes (Signed)
Report given to chris - care turned over

## 2011-09-14 NOTE — ED Notes (Signed)
EKG machine in use

## 2011-09-14 NOTE — ED Notes (Signed)
Report received from E. Wotring, Charity fundraiser - stated when pt brought to Exam Rm 3 from waiting area, was very short of breath, which calmed after he rested on exam table.  In talking with pt, states SOB started last night - denies hx CHF or COPD.  Denies any chest pain.

## 2011-09-14 NOTE — ED Provider Notes (Signed)
Dylan Morgan is a 66 y.o. male who presents to Urgent Care today for   1) right lower leg cellulitis: She developed pain and redness at the right shin approximately 2 days ago. It has worsened some recently. He denies any fevers or chills.  He has recurrent cellulitis which is usually treated with what appears to Keflex.  2) shortness of breath: Patient incidentally noted shortness of breath with exertion starting yesterday. He denies any pains in his chest or palpitations but does feel numbness across his chest.     PMH reviewed. Significant for cerebrovascular disease, aortic aneurysm repair, coronary artery disease. History  Substance Use Topics  . Smoking status: Former Smoker    Quit date: 02/03/2010  . Smokeless tobacco: Never Used  . Alcohol Use: No   ROS as above Medications reviewed. No current facility-administered medications for this encounter.   Current Outpatient Prescriptions  Medication Sig Dispense Refill  . acetaminophen (TYLENOL) 325 MG tablet Take 2 tablets (650 mg total) by mouth every 4 (four) hours as needed for pain.  100 tablet  2  . lisinopril (PRINIVIL,ZESTRIL) 10 MG tablet Take 1 tablet (10 mg total) by mouth daily.  60 tablet  3  . Multiple Vitamins-Minerals (MULTIVITAMIN PO) Take 1 tablet by mouth daily.       . sildenafil (VIAGRA) 100 MG tablet Take 1 tablet (100 mg total) by mouth as needed for erectile dysfunction.  6 tablet  5  . simvastatin (ZOCOR) 10 MG tablet Take 1 tablet (10 mg total) by mouth at bedtime.  90 tablet  4  . vitamin E 1000 UNIT capsule Take 1,000 Units by mouth daily.        Marland Kitchen warfarin (COUMADIN) 5 MG tablet Take 5 mg by mouth daily.        Exam:  BP 94/66  Pulse 80  Temp 98.6 F (37 C) (Oral)  Resp 18  Wt 242 lb (109.77 kg)  SpO2 96% Gen: Well NAD HEENT: EOMI,  MMM Lungs: CTABL Nl WOB Heart: RRR no MRG Abd: NABS, NT, ND Exts: Non edematous BL  LE, warm and well perfused.  Skin: Patch of erythema and warmth to the  touch and mildly tender approximately 5 cm long on the anterior right shin. Please see picture:   EKG: Sinus rhythm with bigeminy. Stable left fascicular block unchanged T-wave patterns. No new ST elevation or depression.  Assessment and Plan: 66 y.o. male with   1) right lower extremity cellulitis. This could likely be managed in the outpatient setting with oral doxycycline with followup at primary care doctor's office.  However I believe that his hypotension, shortness of breath and history of vascular disease deserves a workup in the emergency room. He may benefit from one dose of IV antibiotics while he is there.  Additionally he may need DVT evaluation although I think that is less likely here.  2) shortness of breath hypotension: Patient does have significant coronary artery disease.  His oxygen saturation is normal at rest but I suspect he has desaturations on exertion. His EKG tonight is somewhat concerning with bigeminy.  This mildly arrythmia may result in decreased pumping efficiency of the heart resulting in hypotension or may be secondary to an underlying new heart issue.  Additionally he may have sepsis resulting in hypotension however I feel it is less likely.   As noted above we'll transfer to the emergency room via [facility]. We'll start IV with normal saline at 75 mL per hour here  in the urgent care office     Rodolph Bong, MD 09/14/11 [phone]

## 2011-09-14 NOTE — ED Notes (Signed)
Pt from urgent care facility with right leg cellulitis. IV established at facility, O2 applied for SOB with ambulation, ekg normal, vital signs stable.

## 2011-09-14 NOTE — ED Notes (Addendum)
EKG machine just now available; EKG in progress.

## 2011-09-14 NOTE — ED Notes (Signed)
Report given to [facility]. 

## 2011-09-15 ENCOUNTER — Encounter (HOSPITAL_COMMUNITY): Payer: Self-pay | Admitting: Internal Medicine

## 2011-09-15 DIAGNOSIS — L02419 Cutaneous abscess of limb, unspecified: Secondary | ICD-10-CM

## 2011-09-15 DIAGNOSIS — L03115 Cellulitis of right lower limb: Secondary | ICD-10-CM | POA: Diagnosis present

## 2011-09-15 LAB — CBC
HCT: 37.3 % — ABNORMAL LOW (ref 39.0–52.0)
MCH: 30.4 pg (ref 26.0–34.0)
MCV: 90.1 fL (ref 78.0–100.0)
RBC: 4.14 MIL/uL — ABNORMAL LOW (ref 4.22–5.81)
WBC: 5 10*3/uL (ref 4.0–10.5)

## 2011-09-15 LAB — URINALYSIS, ROUTINE W REFLEX MICROSCOPIC
Glucose, UA: NEGATIVE mg/dL
Hgb urine dipstick: NEGATIVE
Ketones, ur: NEGATIVE mg/dL
Nitrite: NEGATIVE
Specific Gravity, Urine: 1.022 (ref 1.005–1.030)
pH: 5 (ref 5.0–8.0)

## 2011-09-15 LAB — CREATININE, URINE, RANDOM: Creatinine, Urine: 177.45 mg/dL

## 2011-09-15 LAB — COMPREHENSIVE METABOLIC PANEL
Albumin: 4.1 g/dL (ref 3.5–5.2)
Alkaline Phosphatase: 79 U/L (ref 39–117)
BUN: 62 mg/dL — ABNORMAL HIGH (ref 6–23)
Chloride: 100 mEq/L (ref 96–112)
Creatinine, Ser: 4.19 mg/dL — ABNORMAL HIGH (ref 0.50–1.35)
GFR calc Af Amer: 15 mL/min — ABNORMAL LOW (ref >90)
Glucose, Bld: 108 mg/dL — ABNORMAL HIGH (ref 70–99)
Total Bilirubin: 0.3 mg/dL (ref 0.3–1.2)
Total Protein: 7.5 g/dL (ref 6.0–8.3)

## 2011-09-15 LAB — MRSA PCR SCREENING: MRSA by PCR: NEGATIVE

## 2011-09-15 LAB — BASIC METABOLIC PANEL
CO2: 20 mEq/L (ref 19–32)
Calcium: 8.6 mg/dL (ref 8.4–10.5)
Chloride: 104 mEq/L (ref 96–112)
Glucose, Bld: 97 mg/dL (ref 70–99)
Sodium: 139 mEq/L (ref 135–145)

## 2011-09-15 LAB — URINE MICROSCOPIC-ADD ON

## 2011-09-15 LAB — TSH: TSH: 1.57 u[IU]/mL (ref 0.350–4.500)

## 2011-09-15 MED ORDER — SODIUM CHLORIDE 0.9 % IV BOLUS (SEPSIS)
1000.0000 mL | Freq: Once | INTRAVENOUS | Status: AC
  Administered 2011-09-15: 1000 mL via INTRAVENOUS

## 2011-09-15 MED ORDER — SIMVASTATIN 10 MG PO TABS
10.0000 mg | ORAL_TABLET | Freq: Every day | ORAL | Status: DC
  Administered 2011-09-15: 10 mg via ORAL
  Filled 2011-09-15 (×2): qty 1

## 2011-09-15 MED ORDER — WARFARIN - PHARMACIST DOSING INPATIENT: Freq: Every day | Status: DC

## 2011-09-15 MED ORDER — WARFARIN SODIUM 5 MG PO TABS
5.0000 mg | ORAL_TABLET | Freq: Every day | ORAL | Status: DC
  Filled 2011-09-15: qty 1

## 2011-09-15 MED ORDER — ONDANSETRON HCL 4 MG/2ML IJ SOLN: 4.0000 mg | Freq: Three times a day (TID) | INTRAMUSCULAR | Status: AC | PRN

## 2011-09-15 MED ORDER — CEFAZOLIN SODIUM 1-5 GM-% IV SOLN
1.0000 g | Freq: Two times a day (BID) | INTRAVENOUS | Status: DC
  Administered 2011-09-15 (×2): 1 g via INTRAVENOUS
  Filled 2011-09-15 (×4): qty 50

## 2011-09-15 MED ORDER — ATROPINE SULFATE 1 MG/ML IJ SOLN: 0.2000 mg | Freq: Once | INTRAMUSCULAR | Status: DC

## 2011-09-15 MED ORDER — ATROPINE SULFATE 1 MG/ML IJ SOLN
1.0000 mg | Freq: Once | INTRAMUSCULAR | Status: AC
  Administered 2011-09-15: 1 mg via INTRAVENOUS

## 2011-09-15 MED ORDER — SODIUM CHLORIDE 0.9 % IV SOLN
INTRAVENOUS | Status: AC
  Administered 2011-09-15: 01:00:00 via INTRAVENOUS

## 2011-09-15 MED ORDER — ATROPINE SULFATE 0.1 MG/ML IJ SOLN
INTRAMUSCULAR | Status: AC
  Filled 2011-09-15: qty 10

## 2011-09-15 MED ORDER — SODIUM CHLORIDE 0.9 % IV SOLN
INTRAVENOUS | Status: DC
  Administered 2011-09-15 – 2011-09-16 (×4): via INTRAVENOUS

## 2011-09-15 MED ORDER — DOXYCYCLINE HYCLATE 100 MG PO TABS
100.0000 mg | ORAL_TABLET | Freq: Two times a day (BID) | ORAL | Status: DC
  Administered 2011-09-15 (×2): 100 mg via ORAL
  Filled 2011-09-15 (×3): qty 1

## 2011-09-15 MED ORDER — SODIUM CHLORIDE 0.9 % IJ SOLN
3.0000 mL | Freq: Two times a day (BID) | INTRAMUSCULAR | Status: DC
  Administered 2011-09-15 (×3): 3 mL via INTRAVENOUS

## 2011-09-15 NOTE — H&P (Signed)
Hospital Admission Note Date: 09/15/2011  Patient name: Dylan Morgan Medical record number: [id number] Date of birth: [birth year] Age: 66 y.o. Gender: male PCP: Margorie John, MD  Medical Service: Internal Medicine Teaching Service, [facility] Service  Attending physician: Dr. Daiva Eves    1st Contact: Dr. Dorise Hiss   Pager: [phone] 2nd Contact: Dr. Dorthula Rue   Pager: [phone] After 5 pm or weekends: 1st Contact:      Pager: [phone] 2nd Contact:      Pager: [phone]  Chief Complaint: Right leg pain and redness  History of Present Illness: Dylan Morgan is a 66 year old man with a PMH significant for bradycardia, AAA s/p repair, CABG in 2004, CVA, paroxysmal atrial fibrillation, CKD stage II, mild OSA, HTN, an old carotid artery occlusion on the left and HLD who presented to [facility] ED because of redness, swelling, warmth, and pain in his right shin for the last day and a half.  He was initially evaluated at the [facility] and was noted to be hypotensive so he was sent to the [facility] ED.  He states that he gets occasional flares in the right lower leg where they took the vein for his CABG that was done in 2004 in [place].  He states that the redness started a day and a half ago and that the warmth and pain were starting to come on worse so he presented to the ED.  He denies fevers, chills, or open wounds.  He does state that his leg has been mildly more swollen lately.    He also notes that he has been working out in the heat for the last few days and that he has been more thirsty lately.  He states that he has not been urinating as often as before and that his urine has been darker.  He denies presyncope, headaches, dysuria, urinary urgency, difficulty starting his stream, blood in his urine, diarrhea, constipation, or blood in his stools.  He states that he has been mildly lightheaded starting this evening before going to the urgent care.    Meds: Current Outpatient Prescriptions on File Prior to Encounter    Medication Sig Dispense Refill  . lisinopril (PRINIVIL,ZESTRIL) 10 MG tablet Take 1 tablet (10 mg total) by mouth daily.  60 tablet  3  . Multiple Vitamins-Minerals (MULTIVITAMIN PO) Take 1 tablet by mouth daily.       . sildenafil (VIAGRA) 100 MG tablet Take 100 mg by mouth daily as needed. For erectile dysfunction       . simvastatin (ZOCOR) 10 MG tablet Take 10 mg by mouth at bedtime.      . vitamin E 1000 UNIT capsule Take 1,000 Units by mouth daily.        Marland Kitchen warfarin (COUMADIN) 5 MG tablet Take 5 mg by mouth daily.       Allergies: Allergies as of 09/14/2011 - Review Complete 09/14/2011  Allergen Reaction Noted  . Diltiazem hcl Other (See Comments)   . Oxycodone-acetaminophen Other (See Comments)    Past Medical History  Diagnosis Date  . CAD (coronary artery disease) CABG in 2004    F/U by Dr Genia Harold cardiology, cardiolite scan done elsewhere in October 2010 that was normal, no ischemia seen, EF 55-65% in 02/2005  . GERD (gastroesophageal reflux disease)   . Hypertension   . Hyperlipidemia   . Renal insufficiency   . Cellulitis and abscess of leg     right leg  . Tobacco abuse   .  Chronic back pain   . Chronic neck pain   . Seborrheic dermatitis of scalp   . Abnormal LFTs     Hx of abnormal LFT's  . Bradycardia   . AAA (abdominal aortic aneurysm)     November 2010, Stenting, November, 2010  . Paroxysmal atrial fibrillation 02/22/2009    diltiazem po started but discontinued due to side effects.  . Stroke   . Ejection fraction     EF 40% in the past  /   EF 55-65% echo, November, 2006  . Hx of CABG     2004  . Carotid artery disease     Doppler, October, 2012, and old LICA occlusion , RICA no significant abnormality  . Arthritis   . Prediabetes   . CVA (cerebral infarction)     R Carona radiata stroke 08/16/2011   Past Surgical History  Procedure Date  . Coronary artery bypass graft 2004  . Rotator cuff repair    Family History  Problem Relation Age of  Onset  . Diabetes Mother   . Stroke Father   . Cancer Sister     thyroid cancer  . Heart disease Brother     Coronary artery disease   History   Social History  . Marital Status: Single    Spouse Name: N/A    Number of Children: N/A  . Years of Education: N/A   Occupational History  . disabled    Social History Main Topics  . Smoking status: Former Smoker    Quit date: 02/03/2010  . Smokeless tobacco: Never Used  . Alcohol Use: No  . Drug Use: No  . Sexually Active: No   Other Topics Concern  . Not on file   Social History Narrative   Lives with son and his wife.Divorced.Regular exercise.   Review of Systems: Constitutional: Denies fever, chills, diaphoresis, appetite change and fatigue.  HEENT: Denies photophobia, eye pain, redness, hearing loss, ear pain, congestion, sore throat, rhinorrhea, sneezing, mouth sores, trouble swallowing, neck pain, neck stiffness and tinnitus.   Respiratory: Positive for DOE.  Denies SOB, cough, chest tightness,  and wheezing.   Cardiovascular: Denies chest pain, palpitations and leg swelling.  Gastrointestinal: Denies nausea, vomiting, abdominal pain, diarrhea, constipation, blood in stool and abdominal distention.  Genitourinary: Denies dysuria, urgency, frequency, hematuria, flank pain and difficulty urinating.  Musculoskeletal: Denies myalgias, back pain, joint swelling, arthralgias and gait problem.  Skin: Positive for rash.  Denies pallor, and wound.  Neurological: Denies dizziness, seizures, syncope, weakness, light-headedness, numbness and headaches.  Hematological: Denies adenopathy. Easy bruising, personal or family bleeding history  Psychiatric/Behavioral: Denies suicidal ideation, mood changes, confusion, nervousness, sleep disturbance and agitation  Physical Exam: Blood pressure 110/63, pulse 69, temperature 97.7 F (36.5 C), temperature source Axillary, resp. rate 14, height 5\' 11"  (1.803 m), weight 238 lb 12.1 oz (108.3  kg), SpO2 96.00%. Constitutional: Vital signs reviewed.  Patient is a well-developed and well-nourished elderly man in no acute distress and cooperative with exam. Alert and oriented x3.  Head: Normocephalic and atraumatic Ear: TM normal bilaterally Mouth: no erythema or exudates, dry mucus membranes with a furrowed tongue Eyes: PERRL, EOMI, conjunctivae pale, No scleral icterus.  Neck: Supple, Trachea midline normal ROM, No JVD, mass, thyromegaly, or carotid bruit present.  Cardiovascular: RRR, S1 normal, S2 normal, no MRG, pulses symmetric and intact bilaterally Pulmonary/Chest: CTAB, no wheezes, rales, or rhonchi Abdominal: Soft. Non-tender, non-distended, bowel sounds are normal, no masses, organomegaly, or guarding present.  GU:  no CVA tenderness Musculoskeletal: No joint deformities, erythema, or stiffness, ROM full and no nontender Hematology: no cervical, inginal, or axillary adenopathy.  Neurological: A&O x3, Strength is normal and symmetric bilaterally, cranial nerve II-XII are grossly intact, no focal motor deficit, sensory intact to light touch bilaterally.  Skin:  There is a well circumscribed area of erythema on the right anterior tibia. The area is warmer then the surrounding area as well as the left leg.  There is trace pitting edema bilaterally to the knee.  There is a long scar noted on the medial anterior tibia from previous CABG.  There is no area of skin breakdown noted.  Remainder of the skin is warm, dry and intact. No cyanosis, or clubbing.  Psychiatric: Normal mood and affect. speech and behavior is normal. Judgment and thought content normal. Cognition and memory are normal.    Lab results: Basic Metabolic Panel:  Basename 09/14/11 2229 09/14/11 2208  NA 139 139  K 4.2 4.1  CL 100 108  CO2 20 --  GLUCOSE 108* 103*  BUN 62* 61*  CREATININE 4.19* 4.40*  CALCIUM 9.2 --  MG -- --  PHOS -- --   CBC:  Basename 09/14/11 2208 09/14/11 2134  WBC -- 7.1  NEUTROABS  -- 4.1  HGB 13.3 13.0  HCT 39.0 38.2*  MCV -- 89.5  PLT -- 162   Cardiac Enzymes:  Basename 09/14/11 2134  CKTOTAL --  CKMB --  CKMBINDEX --  TROPONINI <0.30   Coagulation:  Basename 09/14/11 2229  LABPROT 28.5*  INR 2.63*   Urine Drug Screen: Drugs of Abuse     Component Value Date/Time   LABOPIA NONE DETECTED 08/16/2011 2137   COCAINSCRNUR NONE DETECTED 08/16/2011 2137   LABBENZ NONE DETECTED 08/16/2011 2137   AMPHETMU NONE DETECTED 08/16/2011 2137   THCU NONE DETECTED 08/16/2011 2137   LABBARB NONE DETECTED 08/16/2011 2137    Imaging results:  Dg Chest 2 View  09/14/2011  *RADIOLOGY REPORT*  Clinical Data: Wheezing and shortness of breath.  Nonsmoker.  CHEST - 2 VIEW  Comparison: 03/01/2009  Findings: Hyperinflation. Prior median sternotomy.  Midline trachea.  Mild cardiomegaly with a tortuous descending thoracic aorta. No pleural effusion or pneumothorax.  Diffuse peribronchial thickening.  Cannot exclude nodule projecting over the left upper lobe.  This is in the region of summation of osseous shadows. No lobar consolidation.  IMPRESSION:  1.  Cardiomegaly and chronic interstitial thickening, without acute disease. 2.  Cannot exclude left upper lobe lung nodule.  This is in the region of the osseous summation shadows.  Consider non emergent chest CT.  These results were called by telephone on 09/14/2011  at  10:29 p.m. to  Linwood Dibbles, who verbally acknowledged these results.  Original Report Authenticated By: Consuello Bossier, M.D.   Other results: EKG: unchanged from previous tracings, sinus bradycardia.  Assessment & Plan by Problem: 1. Acute on Chronic renal failure (CKD Stage II):  On admission he was noted to be in acute on chronic renal failure.  His baseline creatinine is between 1.3-1.5 and on admission was 4.40 and he was noted to be hypotensive.  Likely cause is decreased oral intake and increased loss from working out in the 95 degree heat.  He is clinically volume  depleted on exam today.   Plan:  - Admit to SDU  - Fluid resuscitation with NS at 200 ml/hr  - repeat Bmet in AM  - Urine lytes, UA  2.  Right lower  extremity Cellulitis: He states that since his CABG he gets a flare of a cellulitis occasionally.  He has no fever and his WBC count is normal.  Differential has to include lipodermatosclerosis in this patient with his venous stasis secondary to his vein harvest for his CABG.    Plan:  - Doxycycline 100 mg BID  - SCDs to bilateral lower legs  - Elevation and heat pack or ice as needed  3. Bradycardia:  Patient has a long standing history of bradycardia and is followed by Dr. Myrtis Ser of [facility] cardiology.  He recently wore an event monitor and had a few pauses as well as paroxysmal atrial fibrillation noted.    -Monitor him in SDU with fluid resuscitation.  - If he has continual pauses or is profoundly bradycardic and symptomatic, consult Cardiology  4. HTN:  He is currently hypotensive likely from volume depletion.  We will hold his lisinopril for now and continue to monitor.  With his Bradycardia, AV blocking agents like beta blockers, diltiazem, and Verapamil are contraindicated.  5. Paroxysmal atrial fibrillation: Recently noted on Holter monitor and started on coumadin.  Currently he is therapeutic so we will continue his coumadin per pharmacy and monitor.    6. VTE:  SCDs  Signed: Izaiah Tabb 09/15/2011, 1:35 AM

## 2011-09-15 NOTE — Progress Notes (Signed)
ANTICOAGULATION CONSULT NOTE - Follow Up Consult  Pharmacy Consult for Coumadin Indication: atrial fibrillation  Allergies  Allergen Reactions  . Diltiazem Hcl Other (See Comments)    unknown  . Oxycodone-Acetaminophen Other (See Comments)    unknown    Patient Measurements: Height: 5\' 11"  (180.3 cm) Weight: 244 lb 14.9 oz (111.1 kg) IBW/kg (Calculated) : 75.3   Vital Signs: Temp: 98.6 F (37 C) (06/14 0700) Temp src: Oral (06/14 0700) BP: 117/61 mmHg (06/14 0800) Pulse Rate: 43  (06/14 0800)  Labs:  Basename 09/15/11 0345 09/14/11 2229 09/14/11 2208 09/14/11 2134  HGB 12.6* -- 13.3 --  HCT 37.3* -- 39.0 38.2*  PLT 130* -- -- 162  APTT -- -- -- --  LABPROT 35.6* 28.5* -- --  INR 3.49* 2.63* -- --  HEPARINUNFRC -- -- -- --  CREATININE 3.65* 4.19* 4.40* --  CKTOTAL -- -- -- --  CKMB -- -- -- --  TROPONINI -- -- -- <0.30    Estimated Creatinine Clearance: 24.5 ml/min (by C-G formula based on Cr of 3.65).   Medications:  Scheduled:    . sodium chloride   Intravenous STAT  . atropine      . atropine  1 mg Intravenous Once  .  ceFAZolin (ANCEF) IV  1 g Intravenous Once  . doxycycline  100 mg Oral Q12H  .  morphine injection  4 mg Intravenous Once  . ondansetron  4 mg Intravenous Once  . simvastatin  10 mg Oral QHS  . sodium chloride  1,000 mL Intravenous Once  . sodium chloride  1,000 mL Intravenous Once  . sodium chloride  1,000 mL Intravenous Once  . sodium chloride  3 mL Intravenous Q12H  . warfarin  5 mg Oral q1800  . Warfarin - Pharmacist Dosing Inpatient   Does not apply q1800  . DISCONTD: atropine  0.2 mg Intravenous Once  . DISCONTD: clindamycin (CLEOCIN) IV  600 mg Intravenous Once    Assessment: 66 yo M admitted with acute on chronic renal failure most likely 2/2 dehydration.  Pt also reports RLE cellulitis and has started on Doxycycline which can interact with Coumadin.  On Coumadin PTA for hx AFib.  INR today is above goal.  Goal of Therapy:    INR 2-3   Plan:  D/C Coumadin 5mg  daily order. Will follow-up with INR in AM.  Dixie Dials, Pharm.D., BCPS Clinical Pharmacist Pager [phone] 09/15/2011 10:03 AM

## 2011-09-15 NOTE — Progress Notes (Signed)
Utilization review completed.  

## 2011-09-15 NOTE — ED Notes (Signed)
Admit MD at bedside

## 2011-09-15 NOTE — Progress Notes (Signed)
ANTICOAGULATION CONSULT NOTE - Initial Consult  Pharmacy Consult for Coumadin Indication: atrial fibrillation  Allergies  Allergen Reactions  . Diltiazem Hcl Other (See Comments)    unknown  . Oxycodone-Acetaminophen Other (See Comments)    unknown    Patient Measurements: Height: 5\' 11"  (180.3 cm) Weight: 238 lb 12.1 oz (108.3 kg) IBW/kg (Calculated) : 75.3   Vital Signs: Temp: 97.7 F (36.5 C) (06/14 0122) Temp src: Axillary (06/14 0122) BP: 110/63 mmHg (06/14 0122) Pulse Rate: 69  (06/14 0122)  Labs:  Alvira Philips 09/14/11 2229 09/14/11 2208 09/14/11 2134  HGB -- 13.3 13.0  HCT -- 39.0 38.2*  PLT -- -- 162  APTT -- -- --  LABPROT 28.5* -- --  INR 2.63* -- --  HEPARINUNFRC -- -- --  CREATININE 4.19* 4.40* --  CKTOTAL -- -- --  CKMB -- -- --  TROPONINI -- -- <0.30    Estimated Creatinine Clearance: 21.1 ml/min (by C-G formula based on Cr of 4.19).   Medical History: Past Medical History  Diagnosis Date  . CAD (coronary artery disease) CABG in 2004    F/U by Dr Genia Harold cardiology, cardiolite scan done elsewhere in October 2010 that was normal, no ischemia seen, EF 55-65% in 02/2005  . GERD (gastroesophageal reflux disease)   . Hypertension   . Hyperlipidemia   . Renal insufficiency   . Cellulitis and abscess of leg     right leg  . Tobacco abuse   . Chronic back pain   . Chronic neck pain   . Seborrheic dermatitis of scalp   . Abnormal LFTs     Hx of abnormal LFT's  . Bradycardia   . AAA (abdominal aortic aneurysm)     November 2010, Stenting, November, 2010  . Paroxysmal atrial fibrillation 02/22/2009    diltiazem po started but discontinued due to side effects.  . Stroke   . Ejection fraction     EF 40% in the past  /   EF 55-65% echo, November, 2006  . Hx of CABG     2004  . Carotid artery disease     Doppler, October, 2012, and old LICA occlusion , RICA no significant abnormality  . Arthritis   . Prediabetes   . CVA (cerebral  infarction)     R Carona radiata stroke 08/16/2011    Medications:  Prescriptions prior to admission  Medication Sig Dispense Refill  . lisinopril (PRINIVIL,ZESTRIL) 10 MG tablet Take 1 tablet (10 mg total) by mouth daily.  60 tablet  3  . Multiple Vitamins-Minerals (MULTIVITAMIN PO) Take 1 tablet by mouth daily.       . sildenafil (VIAGRA) 100 MG tablet Take 100 mg by mouth daily as needed. For erectile dysfunction       . simvastatin (ZOCOR) 10 MG tablet Take 10 mg by mouth at bedtime.      . vitamin E 1000 UNIT capsule Take 1,000 Units by mouth daily.        Marland Kitchen warfarin (COUMADIN) 5 MG tablet Take 5 mg by mouth daily.        Assessment: 66 yo male admitted with dehydration/ARF, h/o Afib to continue anticoagulation  Goal of Therapy:  INR 2-3   Plan:  Continue home Coumadin regimen F/U daily INR  Jillann Charette, Gary Fleet 09/15/2011,2:25 AM

## 2011-09-15 NOTE — ED Provider Notes (Signed)
Medical screening examination/treatment/procedure(s) were conducted as a shared visit with non-physician practitioner(s) and myself.  I personally evaluated the patient during the encounter  Pt with cellulitis lower extrem.  New onset renal failure however.  Overall appears well and would have preferred to go home.  Will admit for IV fluids, abx.  Celene Kras, MD 09/15/11 [phone]

## 2011-09-15 NOTE — Progress Notes (Signed)
09/15/2011 0550  Pt's HR in 30s-40s and BP 97/57. Dr. Milbert Coulter notified. New orders for 1 L saline bolus. Will administer bolus and continue to monitor.  [place] Raheen Capili, RN

## 2011-09-15 NOTE — H&P (Signed)
Internal Medicine Teaching Service Attending Note Date: 09/15/2011  Patient name: ARACELI COUFAL  Medical record number: [id number]  Date of birth: 01-08-1946    This patient has been seen and discussed with the house staff. Please see their note for complete details. I concur with their findings with the following additions/corrections:  66 year old with AAA, sp repair, bradycardia, CABG, CVA PAF, CKD admitted with acute renal failure with poor oral intake, and swelling erythema in shin c/w recurrence of his cellulitis.  Agree with IVF repletion with caution given his hx. I would dc the doxy and simply give him IV ancef which will be slow to clear until his renal fxn improves. If cellulitis improves on this he could be changed to keflex.     Acey Lav 09/15/2011, 12:26 PM

## 2011-09-15 NOTE — Progress Notes (Signed)
ANTIBIOTIC CONSULT NOTE - INITIAL  Pharmacy Consult for Ancef Indication: Cellulitis  Allergies  Allergen Reactions  . Diltiazem Hcl Other (See Comments)    unknown  . Oxycodone-Acetaminophen Other (See Comments)    unknown    Patient Measurements: Height: 5\' 11"  (180.3 cm) Weight: 244 lb 14.9 oz (111.1 kg) IBW/kg (Calculated) : 75.3   Vital Signs: Temp: 98.4 F (36.9 C) (06/14 1150) Temp src: Oral (06/14 1150) BP: 99/49 mmHg (06/14 1230) Pulse Rate: 41  (06/14 1230) Intake/Output from previous day: 06/13 0701 - 06/14 0700 In: 2683 [P.O.:480; I.V.:2203] Out: 200 [Urine:200] Intake/Output from this shift: Total I/O In: 1660 [P.O.:360; I.V.:800; IV Piggyback:500] Out: 600 [Urine:600]  Labs:  [facility] 09/15/11 0630 09/15/11 0345 09/14/11 2229 09/14/11 2208 09/14/11 2134  WBC -- 5.0 -- -- 7.1  HGB -- 12.6* -- 13.3 13.0  PLT -- 130* -- -- 162  LABCREA 177.45 -- -- -- --  CREATININE -- 3.65* 4.19* 4.40* --   Estimated Creatinine Clearance: 24.5 ml/min (by C-G formula based on Cr of 3.65). No results found for this basename: VANCOTROUGH:2,VANCOPEAK:2,VANCORANDOM:2,GENTTROUGH:2,GENTPEAK:2,GENTRANDOM:2,TOBRATROUGH:2,TOBRAPEAK:2,TOBRARND:2,AMIKACINPEAK:2,AMIKACINTROU:2,AMIKACIN:2, in the last 72 hours   Microbiology: Recent Results (from the past 720 hour(s))  MRSA PCR SCREENING     Status: Normal   Collection Time   08/17/11 11:18 AM      Component Value Range Status Comment   MRSA by PCR NEGATIVE  NEGATIVE Final   MRSA PCR SCREENING     Status: Normal   Collection Time   09/15/11  1:52 AM      Component Value Range Status Comment   MRSA by PCR NEGATIVE  NEGATIVE Final     Medical History: Past Medical History  Diagnosis Date  . CAD (coronary artery disease) CABG in 2004    F/U by Dr Genia Harold cardiology, cardiolite scan done elsewhere in October 2010 that was normal, no ischemia seen, EF 55-65% in 02/2005  . GERD (gastroesophageal reflux disease)   .  Hypertension   . Hyperlipidemia   . Renal insufficiency   . Cellulitis and abscess of leg     right leg  . Tobacco abuse   . Chronic back pain   . Chronic neck pain   . Seborrheic dermatitis of scalp   . Abnormal LFTs     Hx of abnormal LFT's  . Bradycardia   . AAA (abdominal aortic aneurysm)     November 2010, Stenting, November, 2010  . Paroxysmal atrial fibrillation 02/22/2009    diltiazem po started but discontinued due to side effects.  . Stroke   . Ejection fraction     EF 40% in the past  /   EF 55-65% echo, November, 2006  . Hx of CABG     2004  . Carotid artery disease     Doppler, October, 2012, and old LICA occlusion , RICA no significant abnormality  . Arthritis   . Prediabetes   . CVA (cerebral infarction)     R Carona radiata stroke 08/16/2011   Assessment: 51 YOM M known to pharmacy for coumadin dosing, now with R leg pain and redness, ID following, concern for cellulitis, will be treated with cefazolin. Pt. Received 1g cefazolin yesterday PM. Pt. Also has acute on chronic renal failure, current est. crcl 25, plan for changing to PO keflex if cellulitis improves.  Plan:  - Start cefazolin 1g IV Q 12hrs. - F/u renal function  Bayard Hugger, PharmD, BCPS  Clinical Pharmacist  Pager: [phone]  09/15/2011,1:06 PM

## 2011-09-15 NOTE — Progress Notes (Signed)
Subjective: The patient is lying in bed comfortably. He states that he is still a little bit dehydrated at this time. He has been eating since getting to the hospital. His leg is not hurting him right now. He has not had fevers or chills overnight. He has also had no SOB, no chest pain, no nausea, no vomiting. His last bowel movement was 1 day ago. He continues to have heart rates in the high 30s to low 40s. His blood pressures have been running 80-90 systolic and 40-50 diastolic. He is asymptomatic of this and is not feeling weak, dizzy, lightheaded.  Objective: Vital signs in last 24 hours: Filed Vitals:   09/15/11 1130 09/15/11 1150 09/15/11 1215 09/15/11 1230  BP: 94/43  101/50 99/49  Pulse: 48  40 41  Temp:  98.4 F (36.9 C)    TempSrc:  Oral    Resp: 17  14 13   Height:      Weight:      SpO2: 99%  97% 97%   Weight change:   Intake/Output Summary (Last 24 hours) at 09/15/11 1334 Last data filed at 09/15/11 1214  Gross per 24 hour  Intake   4343 ml  Output    800 ml  Net   3543 ml   General: resting in bed HEENT: PERRL, EOMI, no scleral icterus Cardiac: bradycardic Pulm: clear to auscultation bilaterally, moving normal volumes of air Abd: soft, nontender, nondistended, BS present Ext: warm and well perfused, no pedal edema, area outlined on right leg seems to have some clearing around the edges of the outline. Still some redness around the area of cellulitis. Slight warmth. Neuro: alert and oriented X3, cranial nerves II-XII grossly intact  Lab Results: Basic Metabolic Panel:  Lab 09/15/11 1610 09/14/11 2229  NA 139 139  K 3.8 4.2  CL 104 100  CO2 20 20  GLUCOSE 97 108*  BUN 59* 62*  CREATININE 3.65* 4.19*  CALCIUM 8.6 9.2  MG -- --  PHOS -- --   Liver Function Tests:  Lab 09/14/11 2229  AST 28  ALT 19  ALKPHOS 79  BILITOT 0.3  PROT 7.5  ALBUMIN 4.1   CBC:  Lab 09/15/11 0345 09/14/11 2208 09/14/11 2134  WBC 5.0 -- 7.1  NEUTROABS -- -- 4.1  HGB 12.6*  13.3 --  HCT 37.3* 39.0 --  MCV 90.1 -- 89.5  PLT 130* -- 162   Cardiac Enzymes:  Lab 09/14/11 2134  CKTOTAL --  CKMB --  CKMBINDEX --  TROPONINI <0.30   Thyroid Function Tests:  Lab 09/15/11 0345  TSH 1.570  T4TOTAL --  FREET4 --  T3FREE --  THYROIDAB --   Coagulation:  Lab 09/15/11 0345 09/14/11 2229 09/11/11 1242  LABPROT 35.6* 28.5* --  INR 3.49* 2.63* 1.9   Urinalysis:  Lab 09/15/11 0151  COLORURINE YELLOW  LABSPEC 1.022  PHURINE 5.0  GLUCOSEU NEGATIVE  HGBUR NEGATIVE  BILIRUBINUR SMALL*  KETONESUR NEGATIVE  PROTEINUR 30*  UROBILINOGEN 1.0  NITRITE NEGATIVE  LEUKOCYTESUR NEGATIVE   Studies/Results: Dg Chest 2 View  09/14/2011  *RADIOLOGY REPORT*  Clinical Data: Wheezing and shortness of breath.  Nonsmoker.  CHEST - 2 VIEW  Comparison: 03/01/2009  Findings: Hyperinflation. Prior median sternotomy.  Midline trachea.  Mild cardiomegaly with a tortuous descending thoracic aorta. No pleural effusion or pneumothorax.  Diffuse peribronchial thickening.  Cannot exclude nodule projecting over the left upper lobe.  This is in the region of summation of osseous shadows. No lobar consolidation.  IMPRESSION:  1.  Cardiomegaly and chronic interstitial thickening, without acute disease. 2.  Cannot exclude left upper lobe lung nodule.  This is in the region of the osseous summation shadows.  Consider non emergent chest CT.  These results were called by telephone on 09/14/2011  at  10:29 p.m. to  Linwood Dibbles, who verbally acknowledged these results.  Original Report Authenticated By: Consuello Bossier, M.D.   Medications: I have reviewed the patient's current medications. Scheduled Meds:   . sodium chloride   Intravenous STAT  . atropine      . atropine  1 mg Intravenous Once  .  ceFAZolin (ANCEF) IV  1 g Intravenous Once  .  ceFAZolin (ANCEF) IV  1 g Intravenous Q12H  .  morphine injection  4 mg Intravenous Once  . ondansetron  4 mg Intravenous Once  . simvastatin  10 mg Oral  QHS  . sodium chloride  1,000 mL Intravenous Once  . sodium chloride  1,000 mL Intravenous Once  . sodium chloride  1,000 mL Intravenous Once  . sodium chloride  1,000 mL Intravenous Once  . sodium chloride  3 mL Intravenous Q12H  . Warfarin - Pharmacist Dosing Inpatient   Does not apply q1800  . DISCONTD: atropine  0.2 mg Intravenous Once  . DISCONTD: clindamycin (CLEOCIN) IV  600 mg Intravenous Once  . DISCONTD: doxycycline  100 mg Oral Q12H  . DISCONTD: warfarin  5 mg Oral q1800   Continuous Infusions:   . sodium chloride 200 mL/hr at 09/15/11 0600   PRN Meds:.ondansetron (ZOFRAN) IV Assessment/Plan:  Acute on chronic renal failure (baseline Stage II) - Likely pre-renal cause however FeNa not <1 because samples were obtained after multiple fluid boluses. Cr 3.65 from Cr 4.4. Will continue to give 250 cc/hr of fluids with boluses as needed for blood pressure.    Cellulitis of right anterior lower leg - Pt started on doxycycline on admission and switched to Ancef today. Does seem to be clearing up. No fevers since admission.   Hypertension - As pt is typically hypertensive it is concerning that he is still volume depleted without medications. Have stopped his anti-hypertensives.     Bradycardia - Has been evaluated by Dr. Myrtis Ser however concerning considering he is dehydrated and slightly septic. Is very concerning however and if still bradycardic tomorrow will call his cardiologist and evaluate his heart.    CVA (cerebral infarction) - History of, with no residual deficits. On coumadin.  DVT ppx - On coumadin  Disposition - Will continue to watch blood pressure and heart rate and kidney function.     LOS: 1 day   Genella Mech 09/15/2011, 1:34 PM

## 2011-09-15 NOTE — Progress Notes (Addendum)
Dr. Dorise Hiss notified of pt's HR (30-40s) and recent BPs (80s-90s SBP); pt asymptomatic--no new orders at this time. Plan of care--continue to monitor BPs and notify MD if SBPs stay in 80s. Renette Butters, Viona Gilmore

## 2011-09-15 NOTE — Progress Notes (Signed)
09/15/2011 0655  1 L bolus is complete and Pt's HR as low as 38 and BP 100/62. Pt is asymptomatic. Dr. Milbert Coulter notified. No new orders received. Will continue to monitor.  [place] Toddrick Sanna, RN

## 2011-09-16 LAB — PROTIME-INR
INR: 3.73 — ABNORMAL HIGH (ref 0.00–1.49)
Prothrombin Time: 37.5 seconds — ABNORMAL HIGH (ref 11.6–15.2)

## 2011-09-16 LAB — BASIC METABOLIC PANEL
CO2: 20 mEq/L (ref 19–32)
Calcium: 8.3 mg/dL — ABNORMAL LOW (ref 8.4–10.5)
GFR calc Af Amer: 46 mL/min — ABNORMAL LOW (ref >90)
Sodium: 139 mEq/L (ref 135–145)

## 2011-09-16 MED ORDER — CEFAZOLIN SODIUM 1-5 GM-% IV SOLN
1.0000 g | Freq: Three times a day (TID) | INTRAVENOUS | Status: DC
  Administered 2011-09-16: 1 g via INTRAVENOUS
  Filled 2011-09-16 (×2): qty 50

## 2011-09-16 MED ORDER — CEPHALEXIN 500 MG PO CAPS: 500.0000 mg | ORAL_CAPSULE | Freq: Two times a day (BID) | ORAL | Status: AC

## 2011-09-16 NOTE — Progress Notes (Signed)
Pt given discharge instructions--verbalized understanding of follow-up appts, diet, activity level, home meds, s/s of complications and when to call MD/EMS. Renette Butters, Viona Gilmore

## 2011-09-16 NOTE — Discharge Instructions (Signed)
We saw you in the hospital for your cellulitis and because your kidneys were having problems. Your wound on your leg and your kidneys are getting much better. Continue drinking LOTS of fluids for the next week. Also continue taking your antibiotic Keflex for 1 week. Take 1 pill with breakfast and 1 pill with dinner for 1 week. We will have you come see [place] in our clinic in the ground floor at [facility]. We will call you with the appointment time. If you do not hear from [place] by Tuesday please call us at [phone]. Please try to call your heart doctor and move up your appointment if possible. Otherwise keep your appointment with Dr. Myrtis Ser for the beginning of July.

## 2011-09-16 NOTE — Discharge Summary (Signed)
Internal Medicine Teaching [facility] Discharge Note  Name: Dylan Morgan MRN: [id number] DOB: [birth year] 66 y.o.  Date of Admission: 09/14/2011  7:32 PM Date of Discharge: 09/16/2011 Attending Physician: Dylan Hiss, MD  Discharge Diagnosis: Principal Problem:  *Acute on chronic renal failure (baseline Stage II) Active Problems:  Hypertension  Bradycardia  Carotid artery disease  Prediabetes  CVA (cerebral infarction)  Cellulitis of right anterior lower leg   Discharge Medications: Medication List  As of 09/16/2011  9:31 AM   TAKE these medications         cephALEXin 500 MG capsule   Commonly known as: KEFLEX   Take 1 capsule (500 mg total) by mouth 2 (two) times daily.      lisinopril 10 MG tablet   Commonly known as: PRINIVIL,ZESTRIL   Take 1 tablet (10 mg total) by mouth daily.      MULTIVITAMIN PO   Take 1 tablet by mouth daily.      sildenafil 100 MG tablet   Commonly known as: VIAGRA   Take 100 mg by mouth daily as needed. For erectile dysfunction      simvastatin 10 MG tablet   Commonly known as: ZOCOR   Take 10 mg by mouth at bedtime.      vitamin E 1000 UNIT capsule   Take 1,000 Units by mouth daily.      warfarin 5 MG tablet   Commonly known as: COUMADIN   Take 5 mg by mouth daily.            Disposition and follow-up:   [name] was discharged from [facility] in Stable condition.  At the hospital follow up visit please check BMP for Cr and ask if he has seen his heart doctor and if he will get pacer.   Follow-up Appointments: Follow-up Information    Follow up with Dylan John, MD. (We will call you with an appointment time and date. )    Contact information:   [address] [place] [address] [phone]       Follow up with Dylan Rough, MD. (Please call to see if you can move up your appointment with Dr. Myrtis Ser)    Contact information:   [address], [address] [place] [address] [phone]         Discharge Orders    Future Appointments: Provider: Department: Dept Phone: Center:   10/02/2011 11:00 AM [facility] [phone] None   10/20/2011 4:15 PM Dylan Abed, MD [facility] [phone] [facility]   11/24/2011 11:15 AM Dylan Abed, MD [facility] [phone] [facility]     Future Orders Please Complete By Expires   Diet - low sodium heart healthy      Increase activity slowly      Call MD for:  temperature >100.4      Call MD for:  redness, tenderness, or signs of infection (pain, swelling, redness, odor or green/yellow discharge around incision site)         Consultations:   None  Procedures Performed:  Dg Chest 2 View  09/14/2011  *RADIOLOGY REPORT*  Clinical Data: Wheezing and shortness of breath.  Nonsmoker.  CHEST - 2 VIEW  Comparison: 03/01/2009  Findings: Hyperinflation. Prior median sternotomy.  Midline trachea.  Mild cardiomegaly with a tortuous descending thoracic aorta. No pleural effusion or pneumothorax.  Diffuse peribronchial thickening.  Cannot  exclude nodule projecting over the left upper lobe.  This is in the region of summation of osseous shadows. No lobar consolidation.  IMPRESSION:  1.  Cardiomegaly and chronic interstitial thickening, without acute disease. 2.  Cannot exclude left upper lobe lung nodule.  This is in the region of the osseous summation shadows.  Consider non emergent chest CT.  These results were called by telephone on 09/14/2011  at  10:29 p.m. to  Dylan Morgan, who verbally acknowledged these results.  Original Report Authenticated By: Dylan Morgan, M.D.   Admission HPI:  [name] is a 66 year old man with a PMH significant for bradycardia, AAA s/p repair, CABG in 2004, CVA, paroxysmal atrial fibrillation, CKD stage II, mild OSA, HTN, an old carotid artery occlusion on the left and HLD who presented to [facility] ED  because of redness, swelling, warmth, and pain in his right shin for the last day and a half. He was initially evaluated at the [facility] and was noted to be hypotensive so he was sent to the [facility] ED. He states that he gets occasional flares in the right lower leg where they took the vein for his CABG that was done in 2004 in [place]. He states that the redness started a day and a half ago and that the warmth and pain were starting to come on worse so he presented to the ED. He denies fevers, chills, or open wounds. He does state that his leg has been mildly more swollen lately.   He also notes that he has been working out in the heat for the last few days and that he has been more thirsty lately. He states that he has not been urinating as often as before and that his urine has been darker. He denies presyncope, headaches, dysuria, urinary urgency, difficulty starting his stream, blood in his urine, diarrhea, constipation, or blood in his stools. He states that he has been mildly lightheaded starting this evening before going to the urgent care.   Hospital Course by problem list:   Acute on chronic renal failure (baseline Stage II) - Pt was doing yard work in 90s heat without water and had diminshed PO intake and pre-renal ARF. He was given boluses of fluids and fluids during this stay with improvement of creatinine from 4.4 on admission to 1.7 on day of discharge. Seems like his baseline is around 1-1.3 and advised strongly to push fluids at home for next week after discharge and he agrees to do this. Also some concern for a little bit of sepsis from his cellulitis which was also contributing.   Cellulitis of right anterior lower leg - The patient did have cellulitis which was outlined on admission on his lower right calf laterally. He was started on doxycycline and changed to ancef IV the next day. He continued to steadily improve during this stay and was discharged on keflex 500 mg BID for 7 days. He  was advised to take the whole course even if rash was gone sooner.    Hypertension history with hypotension this admission - Patient did continue to take his BP medications with little PO intake. He was given several fluid boluses for some dehydration and possible sepsis from cellulitis. As infection cleared and with fluids he did improve back to his baseline of 130s systolic.    Bradycardia - Pt does normally have HR in the 50s and in this acute setting was having HRs into the 30s and 40s. However  on day of discharge was more around 19s which is his baseline. He does have appointment with his cardiologist Dr. Myrtis Ser in beginning of July and I did advise him to call them and have them move up his appointment. He was advised that he may need pacemaker for his bradycardia which has been evaluated in the past with echo and holter with unknown cause. Possibly some autonomic dysfunction from CVA.    Carotid artery disease - No acute issue this admission and maintained on coumadin.   CVA (cerebral infarction) - No signs or symptoms of CVA during this admission and stable on coumadin.   Discharge Vitals:  BP 131/59  Pulse 48  Temp 97.4 F (36.3 C) (Oral)  Resp 17  Ht 5\' 11"  (1.803 m)  Wt 244 lb 14.9 oz (111.1 kg)  BMI 34.16 kg/m2  SpO2 98%  Discharge Labs:  Results for orders placed during the hospital encounter of 09/14/11 (from the past 24 hour(s))  PROTIME-INR     Status: Abnormal   Collection Time   09/16/11  5:10 AM      Component Value Range   Prothrombin Time 37.5 (*) 11.6 - 15.2 seconds   INR 3.73 (*) 0.00 - 1.49  BASIC METABOLIC PANEL     Status: Abnormal   Collection Time   09/16/11  5:10 AM      Component Value Range   Sodium 139  135 - 145 mEq/L   Potassium 4.7  3.5 - 5.1 mEq/L   Chloride 111  96 - 112 mEq/L   CO2 20  19 - 32 mEq/L   Glucose, Bld 97  70 - 99 mg/dL   BUN 39 (*) 6 - 23 mg/dL   Creatinine, Ser 0.98 (*) 0.50 - 1.35 mg/dL   Calcium 8.3 (*) 8.4 - 10.5 mg/dL   GFR  calc non Af Amer 40 (*) >90 mL/min   GFR calc Af Amer 46 (*) >90 mL/min    Signed: Genella Mech 09/16/2011, 9:31 AM   Time Spent on Discharge: 25 minutes

## 2011-09-16 NOTE — ED Provider Notes (Signed)
Medical screening examination/treatment/procedure(s) were performed by PGY-3 FM resident and as supervising physician I was immediately available for consultation/collaboration.   Sharin Grave, MD   Sharin Grave, MD 09/16/11 [phone]

## 2011-09-16 NOTE — Progress Notes (Signed)
ANTICOAGULATION & ANTIBIOTIC CONSULT NOTE - Follow Up Consult  Pharmacy Consult for Coumadin & Cefazolin Indication: atrial fibrillation & RLE cellulitis  Allergies  Allergen Reactions  . Diltiazem Hcl Other (See Comments)    unknown  . Oxycodone-Acetaminophen Other (See Comments)    unknown    Patient Measurements: Height: 5\' 11"  (180.3 cm) Weight: 244 lb 14.9 oz (111.1 kg) IBW/kg (Calculated) : 75.3   Vital Signs: Temp: 97.4 F (36.3 C) (06/15 0810) Temp src: Oral (06/15 0810) BP: 131/59 mmHg (06/15 0810) Pulse Rate: 48  (06/15 0810)  Labs:  Alvira Philips 09/16/11 0510 09/15/11 0345 09/14/11 2229 09/14/11 2208 09/14/11 2134  HGB -- 12.6* -- 13.3 --  HCT -- 37.3* -- 39.0 38.2*  PLT -- 130* -- -- 162  APTT -- -- -- -- --  LABPROT 37.5* 35.6* 28.5* -- --  INR 3.73* 3.49* 2.63* -- --  HEPARINUNFRC -- -- -- -- --  CREATININE 1.70* 3.65* 4.19* -- --  CKTOTAL -- -- -- -- --  CKMB -- -- -- -- --  TROPONINI -- -- -- -- <0.30    Estimated Creatinine Clearance: 52.7 ml/min (by C-G formula based on Cr of 1.7).  Assessment:  INR supratherapeutic.  Last Coumadin dose 6/13 at home.      Day #2 Cefazolin - begun on 1 gram IV q12h;  Serum creatinine down significantly.  On NS at 250 ml/hr.  Goal of Therapy:  INR 2-3 Monitor platelets by anticoagulation protocol: Yes Appropriate antibiotic dose for renal function   Plan:   No Coumadin again today.  Continue daily PT/INR.  Add CBC for am.   Will adjust Cefazolin 1gram from q12h to q8hrs.  Dennie Fetters, [place] Pager: [phone] 09/16/2011,8:44 AM

## 2011-09-16 NOTE — Progress Notes (Signed)
Subjective: The patient is lying in bed comfortably. He has tolerated fluids and antibiotics well. His leg is no longer hurting him. He states he was peeing so many times last night because of all the fluids and we discussed that this was a good thing that his kidneys were working.    Objective: Vital signs in last 24 hours: Filed Vitals:   09/15/11 2040 09/16/11 0009 09/16/11 0400 09/16/11 0810  BP: 119/66 126/46 123/50 131/59  Pulse: 59 59 59 48  Temp: 98 F (36.7 C) 97.4 F (36.3 C) 97.6 F (36.4 C) 97.4 F (36.3 C)  TempSrc: Oral Oral Oral Oral  Resp: 16 16 32 17  Height:      Weight:      SpO2: 98% 97% 97% 98%   Weight change:   Intake/Output Summary (Last 24 hours) at 09/16/11 0908 Last data filed at 09/16/11 0810  Gross per 24 hour  Intake 7296.67 ml  Output   3550 ml  Net 3746.67 ml   General: resting in bed HEENT: PERRL, EOMI, no scleral icterus Cardiac: bradycardic Pulm: clear to auscultation bilaterally, moving normal volumes of air Abd: soft, nontender, nondistended, BS present Ext: warm and well perfused, no pedal edema, area outlined on right leg seems to have some clearing around the edges of the outline. Still some redness around the area of cellulitis. Slight warmth. Neuro: alert and oriented X3, cranial nerves II-XII grossly intact  Lab Results: Basic Metabolic Panel:  Lab 09/16/11 1610 09/15/11 0345  NA 139 139  K 4.7 3.8  CL 111 104  CO2 20 20  GLUCOSE 97 97  BUN 39* 59*  CREATININE 1.70* 3.65*  CALCIUM 8.3* 8.6  MG -- --  PHOS -- --   Liver Function Tests:  Lab 09/14/11 2229  AST 28  ALT 19  ALKPHOS 79  BILITOT 0.3  PROT 7.5  ALBUMIN 4.1   CBC:  Lab 09/15/11 0345 09/14/11 2208 09/14/11 2134  WBC 5.0 -- 7.1  NEUTROABS -- -- 4.1  HGB 12.6* 13.3 --  HCT 37.3* 39.0 --  MCV 90.1 -- 89.5  PLT 130* -- 162   Cardiac Enzymes:  Lab 09/14/11 2134  CKTOTAL --  CKMB --  CKMBINDEX --  TROPONINI <0.30   Thyroid Function  Tests:  Lab 09/15/11 0345  TSH 1.570  T4TOTAL --  FREET4 --  T3FREE --  THYROIDAB --   Coagulation:  Lab 09/16/11 0510 09/15/11 0345 09/14/11 2229 09/11/11 1242  LABPROT 37.5* 35.6* 28.5* --  INR 3.73* 3.49* 2.63* 1.9   Urinalysis:  Lab 09/15/11 0151  COLORURINE YELLOW  LABSPEC 1.022  PHURINE 5.0  GLUCOSEU NEGATIVE  HGBUR NEGATIVE  BILIRUBINUR SMALL*  KETONESUR NEGATIVE  PROTEINUR 30*  UROBILINOGEN 1.0  NITRITE NEGATIVE  LEUKOCYTESUR NEGATIVE   Studies/Results: Dg Chest 2 View  09/14/2011  *RADIOLOGY REPORT*  Clinical Data: Wheezing and shortness of breath.  Nonsmoker.  CHEST - 2 VIEW  Comparison: 03/01/2009  Findings: Hyperinflation. Prior median sternotomy.  Midline trachea.  Mild cardiomegaly with a tortuous descending thoracic aorta. No pleural effusion or pneumothorax.  Diffuse peribronchial thickening.  Cannot exclude nodule projecting over the left upper lobe.  This is in the region of summation of osseous shadows. No lobar consolidation.  IMPRESSION:  1.  Cardiomegaly and chronic interstitial thickening, without acute disease. 2.  Cannot exclude left upper lobe lung nodule.  This is in the region of the osseous summation shadows.  Consider non emergent chest CT.  These results were  called by telephone on 09/14/2011  at  10:29 p.m. to  Linwood Dibbles, who verbally acknowledged these results.  Original Report Authenticated By: Consuello Bossier, M.D.   Medications: I have reviewed the patient's current medications. Scheduled Meds:    . sodium chloride   Intravenous STAT  . atropine      .  ceFAZolin (ANCEF) IV  1 g Intravenous Q8H  . simvastatin  10 mg Oral QHS  . sodium chloride  1,000 mL Intravenous Once  . sodium chloride  3 mL Intravenous Q12H  . Warfarin - Pharmacist Dosing Inpatient   Does not apply q1800  . DISCONTD:  ceFAZolin (ANCEF) IV  1 g Intravenous Q12H  . DISCONTD: doxycycline  100 mg Oral Q12H  . DISCONTD: warfarin  5 mg Oral q1800   Continuous  Infusions:    . sodium chloride 150 mL/hr at 09/16/11 0813   PRN Meds:.ondansetron (ZOFRAN) IV Assessment/Plan:  Acute on chronic renal failure (baseline Stage II) - Likely pre-renal cause however FeNa not <1 because samples were obtained after multiple fluid boluses. Cr 1.7 from Cr 4.4. Will continue to give 150 cc/hr of fluids.    Cellulitis of right anterior lower leg - Pt started on doxycycline on admission and switched to Ancef today. Does seem to be clearing up. No fevers since admission.  Will discharge with keflex for 1 week more of therapy.   Hypertension - As pt is typically hypertensive it is concerning that he is still volume depleted without medications. Have stopped his anti-hypertensives.     Bradycardia - Has been evaluated by Dr. Myrtis Ser and recommended strongly to go back and see him again. He has appointment July 1st and advised to call and see if appointment can be moved up. May need pacer in the future.    CVA (cerebral infarction) - History of, with no residual deficits. On coumadin.  DVT ppx - On coumadin  Disposition - D/C home today.    LOS: 2 days   Genella Mech 09/16/2011, 9:08 AM

## 2011-09-19 ENCOUNTER — Encounter: Payer: Medicare Other | Admitting: Physical Therapy

## 2011-09-20 ENCOUNTER — Encounter: Payer: Self-pay | Admitting: Internal Medicine

## 2011-09-20 ENCOUNTER — Ambulatory Visit (INDEPENDENT_AMBULATORY_CARE_PROVIDER_SITE_OTHER): Payer: Medicare Other | Admitting: Internal Medicine

## 2011-09-20 VITALS — BP 121/80 | HR 64 | Temp 96.6°F | Ht 71.0 in | Wt 249.6 lb

## 2011-09-20 DIAGNOSIS — Z23 Encounter for immunization: Secondary | ICD-10-CM

## 2011-09-20 DIAGNOSIS — N179 Acute kidney failure, unspecified: Secondary | ICD-10-CM

## 2011-09-20 DIAGNOSIS — I10 Essential (primary) hypertension: Secondary | ICD-10-CM

## 2011-09-20 DIAGNOSIS — L02419 Cutaneous abscess of limb, unspecified: Secondary | ICD-10-CM

## 2011-09-20 DIAGNOSIS — R001 Bradycardia, unspecified: Secondary | ICD-10-CM

## 2011-09-20 DIAGNOSIS — I498 Other specified cardiac arrhythmias: Secondary | ICD-10-CM

## 2011-09-20 DIAGNOSIS — N189 Chronic kidney disease, unspecified: Secondary | ICD-10-CM

## 2011-09-20 DIAGNOSIS — L03115 Cellulitis of right lower limb: Secondary | ICD-10-CM

## 2011-09-20 DIAGNOSIS — Z0000 Encounter for general adult medical examination without abnormal findings: Secondary | ICD-10-CM

## 2011-09-20 LAB — BASIC METABOLIC PANEL WITH GFR
BUN: 24 mg/dL — ABNORMAL HIGH (ref 6–23)
GFR, Est African American: 60 mL/min
GFR, Est Non African American: 52 mL/min — ABNORMAL LOW
Glucose, Bld: 95 mg/dL (ref 70–99)
Potassium: 5.7 mEq/L — ABNORMAL HIGH (ref 3.5–5.3)
Sodium: 142 mEq/L (ref 135–145)

## 2011-09-20 NOTE — Assessment & Plan Note (Signed)
Patient was not bradycardic today and has a followup appointment with his cardiologist in first week of July.

## 2011-09-20 NOTE — Patient Instructions (Addendum)

## 2011-09-20 NOTE — Assessment & Plan Note (Signed)
DTaP given today 

## 2011-09-20 NOTE — Assessment & Plan Note (Signed)
Followup creatinine today.

## 2011-09-20 NOTE — Assessment & Plan Note (Signed)
Well controlled 

## 2011-09-20 NOTE — Assessment & Plan Note (Signed)
Significant improvement in swelling and redness as compared to Friday. Patient continues to be on cephalexin and has 3 more days of supply left. Patient was advised to call our office if swelling or redness becomes worse.

## 2011-09-20 NOTE — Progress Notes (Signed)
  Subjective:    Patient ID: Dylan Morgan, male    DOB: May 28, 1945, 66 y.o.   MRN: [id number]  HPI Mr. Kina is a 66 year old man with past medical history most significant for recent admission for acute on chronic kidney disease and cellulitis of his right lower extremity.  Followup issues:  #1 cellulitis: Patient is currently completing his course of cephalexin. The redness pain and swelling has gone down significantly at the site of infection.  #2 kidney injury: Patient is drinking a lot of fluid as recommended at discharge. I will obtain basic metabolic profile today to check creatinine.  #3 prevention: Patient is due for DTaP   Review of Systems  Constitutional: Negative for fever, activity change and appetite change.  HENT: Negative for sore throat.   Respiratory: Negative for cough and shortness of breath.   Cardiovascular: Negative for chest pain and leg swelling.  Gastrointestinal: Negative for nausea, abdominal pain, diarrhea, constipation and abdominal distention.  Genitourinary: Negative for frequency, hematuria and difficulty urinating.  Neurological: Negative for dizziness and headaches.  Psychiatric/Behavioral: Negative for suicidal ideas and behavioral problems.       Objective:   Physical Exam  Constitutional: He is oriented to person, place, and time. He appears well-developed and well-nourished.  HENT:  Head: Normocephalic and atraumatic.  Eyes: Conjunctivae and EOM are normal. Pupils are equal, round, and reactive to light. No scleral icterus.  Neck: Normal range of motion. Neck supple. No JVD present. No thyromegaly present.  Cardiovascular: Normal rate, regular rhythm, normal heart sounds and intact distal pulses.  Exam reveals no gallop and no friction rub.   No murmur heard. Pulmonary/Chest: Effort normal and breath sounds normal. No respiratory distress. He has no wheezes. He has no rales.  Abdominal: Soft. Bowel sounds are normal. He exhibits no  distension and no mass. There is no tenderness. There is no rebound and no guarding.  Musculoskeletal: Normal range of motion. He exhibits no edema and no tenderness.       Right lower extremity was examined and this redness was found to be significantly decreased as compared to the marked up area. No tenderness. Warm to touch as compared to surrounding skin  Lymphadenopathy:    He has no cervical adenopathy.  Neurological: He is alert and oriented to person, place, and time.  Psychiatric: He has a normal mood and affect. His behavior is normal.          Assessment & Plan:

## 2011-09-20 NOTE — Addendum Note (Signed)
Addended by: Hassan Buckler on: 09/20/2011 09:52 AM   Modules accepted: Orders

## 2011-09-21 ENCOUNTER — Other Ambulatory Visit: Payer: Medicare Other

## 2011-09-21 ENCOUNTER — Other Ambulatory Visit: Payer: Self-pay | Admitting: Internal Medicine

## 2011-09-21 DIAGNOSIS — E875 Hyperkalemia: Secondary | ICD-10-CM

## 2011-09-21 LAB — BASIC METABOLIC PANEL WITH GFR
BUN: 23 mg/dL (ref 6–23)
Chloride: 102 mEq/L (ref 96–112)
GFR, Est African American: 63 mL/min
GFR, Est Non African American: 55 mL/min — ABNORMAL LOW
Potassium: 4.3 mEq/L (ref 3.5–5.3)
Sodium: 138 mEq/L (ref 135–145)

## 2011-09-21 NOTE — Progress Notes (Signed)
Patient was advised to stop taking lisinopril at this time and come to our clinic for repeat metabolic profile. Patient agreed and is coming to the clinic today for the lab test.

## 2011-10-02 ENCOUNTER — Ambulatory Visit (INDEPENDENT_AMBULATORY_CARE_PROVIDER_SITE_OTHER): Payer: Medicare Other | Admitting: *Deleted

## 2011-10-02 DIAGNOSIS — I480 Paroxysmal atrial fibrillation: Secondary | ICD-10-CM

## 2011-10-02 DIAGNOSIS — I6350 Cerebral infarction due to unspecified occlusion or stenosis of unspecified cerebral artery: Secondary | ICD-10-CM

## 2011-10-02 DIAGNOSIS — I4891 Unspecified atrial fibrillation: Secondary | ICD-10-CM

## 2011-10-02 DIAGNOSIS — Z7901 Long term (current) use of anticoagulants: Secondary | ICD-10-CM

## 2011-10-02 DIAGNOSIS — I639 Cerebral infarction, unspecified: Secondary | ICD-10-CM

## 2011-10-02 LAB — POCT INR: INR: 3.7

## 2011-10-02 MED ORDER — WARFARIN SODIUM 5 MG PO TABS: 5.0000 mg | ORAL_TABLET | Freq: Every day | ORAL | Status: DC

## 2011-10-20 ENCOUNTER — Ambulatory Visit (INDEPENDENT_AMBULATORY_CARE_PROVIDER_SITE_OTHER): Payer: Medicare Other | Admitting: Cardiology

## 2011-10-20 ENCOUNTER — Encounter: Payer: Self-pay | Admitting: Cardiology

## 2011-10-20 ENCOUNTER — Ambulatory Visit (INDEPENDENT_AMBULATORY_CARE_PROVIDER_SITE_OTHER): Payer: Medicare Other

## 2011-10-20 VITALS — BP 110/60 | HR 70 | Ht 71.0 in | Wt 243.0 lb

## 2011-10-20 DIAGNOSIS — I4891 Unspecified atrial fibrillation: Secondary | ICD-10-CM

## 2011-10-20 DIAGNOSIS — I6350 Cerebral infarction due to unspecified occlusion or stenosis of unspecified cerebral artery: Secondary | ICD-10-CM

## 2011-10-20 DIAGNOSIS — I779 Disorder of arteries and arterioles, unspecified: Secondary | ICD-10-CM

## 2011-10-20 DIAGNOSIS — I2510 Atherosclerotic heart disease of native coronary artery without angina pectoris: Secondary | ICD-10-CM

## 2011-10-20 DIAGNOSIS — I480 Paroxysmal atrial fibrillation: Secondary | ICD-10-CM

## 2011-10-20 DIAGNOSIS — Z7901 Long term (current) use of anticoagulants: Secondary | ICD-10-CM

## 2011-10-20 DIAGNOSIS — I639 Cerebral infarction, unspecified: Secondary | ICD-10-CM

## 2011-10-20 LAB — POCT INR: INR: 5.1

## 2011-10-20 NOTE — Assessment & Plan Note (Signed)
He's not having any since benefit of palpitations. He is on Coumadin. No change in therapy.

## 2011-10-20 NOTE — Assessment & Plan Note (Signed)
Cardiac status is stable. No change in therapy.

## 2011-10-20 NOTE — Progress Notes (Signed)
HPI  Patient is seen for cardiology followup. I saw him last Aug 23, 2011. Just prior to that he had been hospitalized with a CVA. It was felt that his stroke was not related to his carotid disease. He had a Doppler at that time. He needs one 6 months later, September, 2013. Today he mentions that he has some soreness in his left neck when he wakes up in the morning at times. This appears to be musculoskeletal. He was recently hospitalized with some dehydration and cellulitis. This was treated and he is better. He is not having significant chest pain or shortness of breath. He's not having significant palpitations.  Allergies  Allergen Reactions  . Diltiazem Hcl Other (See Comments)    unknown  . Oxycodone-Acetaminophen Other (See Comments)    unknown    Current Outpatient Prescriptions  Medication Sig Dispense Refill  . lisinopril (PRINIVIL,ZESTRIL) 10 MG tablet Take 1 tablet (10 mg total) by mouth daily.  60 tablet  3  . sildenafil (VIAGRA) 100 MG tablet Take 100 mg by mouth daily as needed. For erectile dysfunction       . simvastatin (ZOCOR) 10 MG tablet Take 10 mg by mouth at bedtime.      Marland Kitchen warfarin (COUMADIN) 5 MG tablet Take 1 tablet (5 mg total) by mouth daily. 90 day supply  105 tablet  1    History   Social History  . Marital Status: Single    Spouse Name: N/A    Number of Children: N/A  . Years of Education: N/A   Occupational History  . disabled    Social History Main Topics  . Smoking status: Former Smoker    Types: Cigarettes    Quit date: 02/03/2010  . Smokeless tobacco: Never Used  . Alcohol Use: No  . Drug Use: No  . Sexually Active: No   Other Topics Concern  . Not on file   Social History Narrative   Lives with son and his significant other, denies being married. Has 3 daughters & a son.  Several grandchildren. Divorced.Regular exercise.Prior to retirement, hung dry wall for a living.     Family History  Problem Relation Age of Onset  . Diabetes  Mother   . Stroke Father   . Cancer Sister     thyroid cancer  . Heart disease Brother     Coronary artery disease    Past Medical History  Diagnosis Date  . CAD (coronary artery disease) CABG in 2004    F/U by Dr Genia Harold cardiology, cardiolite scan done elsewhere in October 2010 that was normal, no ischemia seen, EF 55-65% in 02/2005  . GERD (gastroesophageal reflux disease)   . Hypertension   . Hyperlipidemia   . Renal insufficiency   . Cellulitis and abscess of leg     right leg  . Tobacco abuse   . Chronic back pain   . Chronic neck pain   . Seborrheic dermatitis of scalp   . Abnormal LFTs     Hx of abnormal LFT's  . Bradycardia   . AAA (abdominal aortic aneurysm)     November 2010, Stenting, November, 2010  . Paroxysmal atrial fibrillation 02/22/2009    diltiazem po started but discontinued due to side effects.  . Stroke   . Ejection fraction     EF 40% in the past  /   EF 55-65% echo, November, 2006  . Hx of CABG     2004  . Carotid  artery disease     Doppler, October, 2012, and old LICA occlusion , RICA no significant abnormality  . Arthritis   . Prediabetes   . CVA (cerebral infarction)     R Carona radiata stroke 08/16/2011    Past Surgical History  Procedure Date  . Coronary artery bypass graft 2004  . Rotator cuff repair     ROS   Patient denies fever, chills, headache, sweats, rash, change in vision, change in hearing, chest pain, cough, nausea vomiting, urinary symptoms. All other systems are reviewed and are negative.  PHYSICAL EXAM  Patient is oriented to person time and place. Affect is normal. Lungs reveal mild decreased breath sounds. There is no jugulovenous distention. Cardiac exam reveals S1 and S2. There no clicks or significant murmurs. The abdomen is soft. He has no significant peripheral edema today.  Filed Vitals:   10/20/11 1623  BP: 110/60  Pulse: 70  Height: 5\' 11"  (1.803 m)  Weight: 243 lb (110.224 kg)  SpO2: 96%      ASSESSMENT & PLAN

## 2011-10-20 NOTE — Assessment & Plan Note (Signed)
He needs a followup carotid Doppler in September, 2013.

## 2011-10-20 NOTE — Patient Instructions (Addendum)
Your physician wants you to follow-up in: 6 months.   You will receive a reminder letter in the mail two months in advance. If you don't receive a letter, please call our office to schedule the follow-up appointment.  Your physician has requested that you have a carotid duplex in September. This test is an ultrasound of the carotid arteries in your neck. It looks at blood flow through these arteries that supply the brain with blood. Allow one hour for this exam. There are no restrictions or special instructions.

## 2011-10-31 ENCOUNTER — Encounter: Payer: Self-pay | Admitting: Vascular Surgery

## 2011-11-03 ENCOUNTER — Encounter: Payer: Self-pay | Admitting: Internal Medicine

## 2011-11-23 ENCOUNTER — Emergency Department (HOSPITAL_COMMUNITY): Payer: Medicare Other

## 2011-11-23 ENCOUNTER — Inpatient Hospital Stay (HOSPITAL_COMMUNITY)
Admission: EM | Admit: 2011-11-23 | Discharge: 2011-11-26 | DRG: 069 | Disposition: A | Discharge status: Home / Self Care | Payer: Medicare Other | Attending: Internal Medicine | Admitting: Internal Medicine

## 2011-11-23 ENCOUNTER — Encounter (HOSPITAL_COMMUNITY): Payer: Self-pay

## 2011-11-23 DIAGNOSIS — Z79899 Other long term (current) drug therapy: Secondary | ICD-10-CM

## 2011-11-23 DIAGNOSIS — I639 Cerebral infarction, unspecified: Secondary | ICD-10-CM | POA: Diagnosis present

## 2011-11-23 DIAGNOSIS — M129 Arthropathy, unspecified: Secondary | ICD-10-CM | POA: Diagnosis present

## 2011-11-23 DIAGNOSIS — I658 Occlusion and stenosis of other precerebral arteries: Secondary | ICD-10-CM | POA: Diagnosis present

## 2011-11-23 DIAGNOSIS — R791 Abnormal coagulation profile: Secondary | ICD-10-CM | POA: Diagnosis present

## 2011-11-23 DIAGNOSIS — N183 Chronic kidney disease, stage 3 unspecified: Secondary | ICD-10-CM | POA: Diagnosis present

## 2011-11-23 DIAGNOSIS — N179 Acute kidney failure, unspecified: Secondary | ICD-10-CM | POA: Diagnosis present

## 2011-11-23 DIAGNOSIS — I10 Essential (primary) hypertension: Secondary | ICD-10-CM | POA: Diagnosis present

## 2011-11-23 DIAGNOSIS — I714 Abdominal aortic aneurysm, without rupture: Secondary | ICD-10-CM

## 2011-11-23 DIAGNOSIS — K219 Gastro-esophageal reflux disease without esophagitis: Secondary | ICD-10-CM | POA: Diagnosis present

## 2011-11-23 DIAGNOSIS — G4733 Obstructive sleep apnea (adult) (pediatric): Secondary | ICD-10-CM | POA: Diagnosis present

## 2011-11-23 DIAGNOSIS — I2510 Atherosclerotic heart disease of native coronary artery without angina pectoris: Secondary | ICD-10-CM | POA: Diagnosis present

## 2011-11-23 DIAGNOSIS — I6529 Occlusion and stenosis of unspecified carotid artery: Secondary | ICD-10-CM | POA: Diagnosis present

## 2011-11-23 DIAGNOSIS — Z8673 Personal history of transient ischemic attack (TIA), and cerebral infarction without residual deficits: Secondary | ICD-10-CM

## 2011-11-23 DIAGNOSIS — I129 Hypertensive chronic kidney disease with stage 1 through stage 4 chronic kidney disease, or unspecified chronic kidney disease: Secondary | ICD-10-CM | POA: Diagnosis present

## 2011-11-23 DIAGNOSIS — M549 Dorsalgia, unspecified: Secondary | ICD-10-CM

## 2011-11-23 DIAGNOSIS — I4891 Unspecified atrial fibrillation: Secondary | ICD-10-CM | POA: Diagnosis present

## 2011-11-23 DIAGNOSIS — G459 Transient cerebral ischemic attack, unspecified: Principal | ICD-10-CM | POA: Diagnosis present

## 2011-11-23 DIAGNOSIS — Z7901 Long term (current) use of anticoagulants: Secondary | ICD-10-CM

## 2011-11-23 DIAGNOSIS — N182 Chronic kidney disease, stage 2 (mild): Secondary | ICD-10-CM | POA: Diagnosis present

## 2011-11-23 DIAGNOSIS — Z87891 Personal history of nicotine dependence: Secondary | ICD-10-CM

## 2011-11-23 DIAGNOSIS — I959 Hypotension, unspecified: Secondary | ICD-10-CM

## 2011-11-23 DIAGNOSIS — I779 Disorder of arteries and arterioles, unspecified: Secondary | ICD-10-CM | POA: Diagnosis present

## 2011-11-23 DIAGNOSIS — I480 Paroxysmal atrial fibrillation: Secondary | ICD-10-CM

## 2011-11-23 DIAGNOSIS — N289 Disorder of kidney and ureter, unspecified: Secondary | ICD-10-CM

## 2011-11-23 DIAGNOSIS — Z951 Presence of aortocoronary bypass graft: Secondary | ICD-10-CM

## 2011-11-23 LAB — CARDIAC PANEL(CRET KIN+CKTOT+MB+TROPI)
CK, MB: 6.3 ng/mL (ref 0.3–4.0)
Relative Index: 1.5 (ref 0.0–2.5)
Total CK: 413 U/L — ABNORMAL HIGH (ref 7–232)
Troponin I: <0.3 ng/mL (ref <0.30)

## 2011-11-23 LAB — URINALYSIS, ROUTINE W REFLEX MICROSCOPIC
Bilirubin Urine: NEGATIVE
Hgb urine dipstick: NEGATIVE
Ketones, ur: NEGATIVE mg/dL
Nitrite: NEGATIVE
Specific Gravity, Urine: 1.025 (ref 1.005–1.030)
Urobilinogen, UA: 0.2 mg/dL (ref 0.0–1.0)

## 2011-11-23 LAB — COMPREHENSIVE METABOLIC PANEL
ALT: 23 U/L (ref 0–53)
Albumin: 4.2 g/dL (ref 3.5–5.2)
Alkaline Phosphatase: 83 U/L (ref 39–117)
BUN: 50 mg/dL — ABNORMAL HIGH (ref 6–23)
Calcium: 10.5 mg/dL (ref 8.4–10.5)
GFR calc Af Amer: 20 mL/min — ABNORMAL LOW (ref >90)
Potassium: 4.9 mEq/L (ref 3.5–5.1)
Sodium: 137 mEq/L (ref 135–145)
Total Protein: 8.3 g/dL (ref 6.0–8.3)

## 2011-11-23 LAB — TROPONIN I: Troponin I: <0.3 ng/mL (ref <0.30)

## 2011-11-23 LAB — DIFFERENTIAL
Basophils Relative: 0 % (ref 0–1)
Eosinophils Absolute: 0.1 10*3/uL (ref 0.0–0.7)
Eosinophils Relative: 1 % (ref 0–5)
Neutrophils Relative %: 63 % (ref 43–77)

## 2011-11-23 LAB — POCT I-STAT, CHEM 8
BUN: 52 mg/dL — ABNORMAL HIGH (ref 6–23)
Calcium, Ion: 1.25 mmol/L (ref 1.13–1.30)
HCT: 43 % (ref 39.0–52.0)
Sodium: 138 mEq/L (ref 135–145)
TCO2: 21 mmol/L (ref 0–100)

## 2011-11-23 LAB — RAPID URINE DRUG SCREEN, HOSP PERFORMED
Barbiturates: NOT DETECTED
Tetrahydrocannabinol: NOT DETECTED

## 2011-11-23 LAB — PROTIME-INR
INR: 4.55 — ABNORMAL HIGH (ref 0.00–1.49)
Prothrombin Time: 43.8 seconds — ABNORMAL HIGH (ref 11.6–15.2)

## 2011-11-23 LAB — CBC
MCH: 30.4 pg (ref 26.0–34.0)
MCHC: 34.1 g/dL (ref 30.0–36.0)
MCV: 89.3 fL (ref 78.0–100.0)
Platelets: 156 10*3/uL (ref 150–400)

## 2011-11-23 LAB — GLUCOSE, CAPILLARY: Glucose-Capillary: 117 mg/dL — ABNORMAL HIGH (ref 70–99)

## 2011-11-23 LAB — CREATININE, URINE, RANDOM: Creatinine, Urine: 307.75 mg/dL

## 2011-11-23 LAB — CK TOTAL AND CKMB (NOT AT ARMC)
Relative Index: 1.5 (ref 0.0–2.5)
Total CK: 542 U/L — ABNORMAL HIGH (ref 7–232)

## 2011-11-23 MED ORDER — SODIUM CHLORIDE 0.9 % IV SOLN
INTRAVENOUS | Status: AC
  Administered 2011-11-23 – 2011-11-24 (×3): via INTRAVENOUS

## 2011-11-23 MED ORDER — SODIUM CHLORIDE 0.9 % IV SOLN
INTRAVENOUS | Status: DC
  Administered 2011-11-24 – 2011-11-26 (×6): via INTRAVENOUS

## 2011-11-23 MED ORDER — SIMVASTATIN 10 MG PO TABS
10.0000 mg | ORAL_TABLET | Freq: Every day | ORAL | Status: DC
  Administered 2011-11-23 – 2011-11-25 (×3): 10 mg via ORAL
  Filled 2011-11-23 (×4): qty 1

## 2011-11-23 NOTE — Consult Note (Signed)
Chief Complaint: Numbness on left side and left facial droop.  HPI: Dylan Morgan is an 66 y.o. male with a history of previous right cerebral infarction in September 2012, TIA in May 2013, paroxysmal atrial fibrillation on anticoagulation with Coumadin, coronary disease, hypertension, hyperlipidemia and known left internal carotid occlusion as well as moderate stenosis of right internal carotid, presenting with acute onset of numbness involving his left side as well as left facial droop. Patient was last seen normal at about 10 AM today. CT scan of his head showed no acute intracranial abnormality. INR was 4.55. NIH stroke score was 4. Patient was not a candidate for TPA because of being outside of time window when he arrived in the emergency room, as well as having supratherapeutic INR  LSN: 10 AM today. tPA Given: No: Supratherapeutic INR; beyond time window for treatment; only mild deficits MRankin: 1  Past Medical History  Diagnosis Date  . CAD (coronary artery disease) CABG in 2004    F/U by Dr Genia Harold cardiology, cardiolite scan done elsewhere in October 2010 that was normal, no ischemia seen, EF 55-65% in 02/2005  . GERD (gastroesophageal reflux disease)   . Hypertension   . Hyperlipidemia   . Renal insufficiency   . Cellulitis and abscess of leg     right leg  . Tobacco abuse   . Chronic back pain   . Chronic neck pain   . Seborrheic dermatitis of scalp   . Abnormal LFTs     Hx of abnormal LFT's  . Bradycardia   . AAA (abdominal aortic aneurysm)     November 2010, Stenting, November, 2010  . Paroxysmal atrial fibrillation 02/22/2009    diltiazem po started but discontinued due to side effects.  . Stroke   . Ejection fraction     EF 40% in the past  /   EF 55-65% echo, November, 2006  . Hx of CABG     2004  . Carotid artery disease     Doppler, October, 2012, and old LICA occlusion , RICA no significant abnormality  . Arthritis   . Prediabetes   . CVA  (cerebral infarction)     R Carona radiata stroke 08/16/2011    Family History  Problem Relation Age of Onset  . Diabetes Mother   . Stroke Father   . Cancer Sister     thyroid cancer  . Heart disease Brother     Coronary artery disease     Medications:  Prior to Admission:  Ibuprofen 200 mg every 6 hours when necessary pain Lisinopril 10 mg per day Multivitamins with minerals one tablet daily Viagra 100 mg per day when necessary Zocor 10 mg at bedtime Coumadin 5 mg per day  Physical Examination: Blood pressure 93/56, pulse 61, temperature 97.9 F (36.6 C), temperature source Oral, resp. rate 17, SpO2 96.00%.  Neurologic Examination: Mental Status: Alert, oriented, thought content appropriate.  Speech slightly dysarthric as patient is edentulous, but without evidence of aphasia. Able to follow commands without difficulty. Cranial Nerves: II-Visual fields were normal. III/IV/VI-Pupils were equal and reacted. Extraocular movements were full and conjugate.    V/VII-no facial numbness and no facial weakness. VIII-normal. X-slightly dysarthric speech. XII-midline tongue extension Motor: 5/5 bilaterally with normal tone and bulk Sensory: Normal throughout. Deep Tendon Reflexes: 2+ and symmetric. Plantars: Flexor bilaterally Cerebellar: slightly reduced coordination of left upper and lower extremities. Carotid auscultation: Normal   Ct Head Wo Contrast  11/23/2011  *RADIOLOGY REPORT*  Clinical  Data: Code stroke.  Left-sided parasthesias.  CT HEAD WITHOUT CONTRAST  Technique:  Contiguous axial images were obtained from the base of the skull through the vertex without contrast.  Comparison: 08/17/2011  Findings: Despite efforts by the patient and technologist, motion artifact is present on some series of today's examination and could not be totally eliminated.  This reduces diagnostic sensitivity and specificity.  The brain stem, cerebellum, cerebral peduncles, basal ganglia,  thalami, lateral ventricles, and basilar cisterns appear unremarkable.  Periventricular and corona radiata white matter hypodensities are most compatible with chronic ischemic microvascular white matter disease.  Faintly increased periventricular white matter hypodensity on the right, for example on image 19 of series 2, is likely a manifestation of the prior lacunar infarcts in this vicinity shown on MRI from 08/17/2011.  Otherwise no significant change.  No intracranial hemorrhage or mass lesion noted.  Slight asymmetry in density in the middle cerebral arteries is primarily attributed to motion artifact; the density the right MCA appears relatively similar to the prior exam, and the left is somewhat obscured by the motion artifact.  Left scleral band noted.  IMPRESSION:  1.  Right periventricular white matter hypodensities adjacent to the right lateral ventricle likely reflect evolutionary findings from prior lacunar infarcts shown in May 2013.  These are somewhat indistinct against the background of chronic microvascular white matter disease.  No definite new infarct, mass lesion, or intracranial hemorrhage is apparent on today's CT scan.  These results were called by telephone on 11/23/2011 at 2:07 p.m. to Dr. Noel Christmas, who verbally acknowledged these results.   Original Report Authenticated By: Dellia Cloud, M.D.     Assessment: 66 y.o. male probable recurrent TIA involving right subcortical middle cerebral artery territory. However, recurrent subcortical right cerebral infarction cannot be ruled out. Patient is currently has supratherapeutic INR on Coumadin.  Stroke Risk Factors - atrial fibrillation, carotid stenosis, hyperlipidemia and hypertension  Plan: 1. HgbA1c, fasting lipid panel 2. MRI, MRA  of the brain without contrast 3. PT consult, OT consult, Speech consult 5. Carotid dopplers 6. Prophylactic therapy-Anticoagulation: Coumadin-Pharmacy to manage 7. Risk factor  modification 8. Telemetry monitoring  C.R. Roseanne Reno, MD [facility] [phone]  11/23/2011, 2:34 PM

## 2011-11-23 NOTE — ED Provider Notes (Signed)
History     CSN: [id number]  Arrival date & time 11/23/11  1326   First MD Initiated Contact with Patient 11/23/11 1333      Chief Complaint  Patient presents with  . Extremity Weakness    (Consider location/radiation/quality/duration/timing/severity/associated sxs/prior treatment) The history is provided by the patient.   patient came in with left facial droop left arm numbness and difficulty walking. He was last normal 10 AM. 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 No chest pain. He has a had a previous stroke with some left-sided deficits at that time. No headache now. He is chronic difficulty seeing out of his left eye. He is on Coumadin for A. fib and previous strokes.  Past Medical History  Diagnosis Date  . CAD (coronary artery disease) CABG in 2004    F/U by Dr Genia Harold cardiology, cardiolite scan done elsewhere in October 2010 that was normal, no ischemia seen, EF 55-65% in 02/2005  . GERD (gastroesophageal reflux disease)   . Hypertension   . Hyperlipidemia   . Renal insufficiency   . Cellulitis and abscess of leg     right leg  . Tobacco abuse   . Chronic back pain   . Chronic neck pain   . Seborrheic dermatitis of scalp   . Abnormal LFTs     Hx of abnormal LFT's  . Bradycardia   . AAA (abdominal aortic aneurysm)     November 2010, Stenting, November, 2010  . Paroxysmal atrial fibrillation 02/22/2009    diltiazem po started but discontinued due to side effects.  . Stroke   . Ejection fraction     EF 40% in the past  /   EF 55-65% echo, November, 2006  . Hx of CABG     2004  . Carotid artery disease     Doppler, October, 2012, and old LICA occlusion , RICA no significant abnormality  . Arthritis   . Prediabetes   . CVA (cerebral infarction)     R Carona radiata stroke 08/16/2011    Past Surgical History  Procedure Date  . Coronary artery bypass graft 2004  . Rotator cuff  repair     Family History  Problem Relation Age of Onset  . Diabetes Mother   . Stroke Father   . Cancer Sister     thyroid cancer  . Heart disease Brother     Coronary artery disease    History  Substance Use Topics  . Smoking status: Former Smoker    Types: Cigarettes    Quit date: 02/03/2010  . Smokeless tobacco: Never Used  . Alcohol Use: No      Review of Systems  Constitutional: Negative for activity change and appetite change.  HENT: Negative for neck stiffness.   Eyes: Negative for pain.  Respiratory: Negative for chest tightness and shortness of breath.   Cardiovascular: Negative for chest pain and leg swelling.  Gastrointestinal: Negative for nausea, vomiting, abdominal pain and diarrhea.  Genitourinary: Negative for flank pain.  Musculoskeletal: Negative for back pain.  Skin: Negative for rash.  Neurological: Positive for weakness. Negative for numbness and headaches.  Psychiatric/Behavioral: Negative for behavioral problems.    Allergies  Diltiazem hcl and Oxycodone-acetaminophen  Home Medications   Current Outpatient Rx  Name Route Sig Dispense Refill  . IBUPROFEN 200 MG PO TABS Oral Take 200 mg by mouth every 6 (six) hours as needed. For  pain    . LISINOPRIL 10 MG PO TABS Oral Take 1 tablet (10 mg total) by mouth daily. 60 tablet 3  . MULTI-VITAMIN/MINERALS PO TABS Oral Take 1 tablet by mouth daily.    Marland Kitchen SILDENAFIL CITRATE 100 MG PO TABS Oral Take 100 mg by mouth daily as needed. For erectile dysfunction     . SIMVASTATIN 10 MG PO TABS Oral Take 10 mg by mouth at bedtime.    . WARFARIN SODIUM 5 MG PO TABS Oral Take 1 tablet (5 mg total) by mouth daily. 90 day supply 105 tablet 1    BP 94/63  Pulse 72  Temp 98 F (36.7 C) (Oral)  Resp 17  SpO2 97%  Physical Exam  Nursing note and vitals reviewed. Constitutional: He is oriented to person, place, and time. He appears well-developed and well-nourished.  HENT:  Head: Normocephalic and  atraumatic.  Eyes: EOM are normal. Pupils are equal, round, and reactive to light.  Neck: Normal range of motion. Neck supple.  Cardiovascular: Normal rate, regular rhythm and normal heart sounds.   No murmur heard. Pulmonary/Chest: Effort normal and breath sounds normal.  Abdominal: Soft. Bowel sounds are normal. He exhibits no distension and no mass. There is no tenderness. There is no rebound and no guarding.  Musculoskeletal: Normal range of motion. He exhibits no edema.  Neurological: He is alert and oriented to person, place, and time. No cranial nerve deficit.       Left-sided facial droop. Paresthesias or decreased sensation to left upper and lower extremity. Good strength bilaterally. Extraocular movements intact. Sensation intact bilaterally over face. Complete NIH score done by neurology.  Skin: Skin is warm and dry.  Psychiatric: He has a normal mood and affect.    ED Course  Procedures (including critical care time)  Labs Reviewed  PROTIME-INR - Abnormal; Notable for the following:    Prothrombin Time 43.8 (*)     INR 4.55 (*)     All other components within normal limits  APTT - Abnormal; Notable for the following:    aPTT 55 (*)     All other components within normal limits  COMPREHENSIVE METABOLIC PANEL - Abnormal; Notable for the following:    Glucose, Bld 127 (*)     BUN 50 (*)     Creatinine, Ser 3.41 (*)     GFR calc non Af Amer 17 (*)     GFR calc Af Amer 20 (*)     All other components within normal limits  CK TOTAL AND CKMB - Abnormal; Notable for the following:    Total CK 542 (*)     CK, MB 8.2 (*)     All other components within normal limits  POCT I-STAT, CHEM 8 - Abnormal; Notable for the following:    BUN 52 (*)     Creatinine, Ser 3.70 (*)     Glucose, Bld 125 (*)     All other components within normal limits  CBC  DIFFERENTIAL  TROPONIN I   Ct Head Wo Contrast  11/23/2011  *RADIOLOGY REPORT*  Clinical Data: Code stroke.  Left-sided  parasthesias.  CT HEAD WITHOUT CONTRAST  Technique:  Contiguous axial images were obtained from the base of the skull through the vertex without contrast.  Comparison: 08/17/2011  Findings: Despite efforts by the patient and technologist, motion artifact is present on some series of today's examination and could not be totally eliminated.  This reduces diagnostic sensitivity and specificity.  The brain  stem, cerebellum, cerebral peduncles, basal ganglia, thalami, lateral ventricles, and basilar cisterns appear unremarkable.  Periventricular and corona radiata white matter hypodensities are most compatible with chronic ischemic microvascular white matter disease.  Faintly increased periventricular white matter hypodensity on the right, for example on image 19 of series 2, is likely a manifestation of the prior lacunar infarcts in this vicinity shown on MRI from 08/17/2011.  Otherwise no significant change.  No intracranial hemorrhage or mass lesion noted.  Slight asymmetry in density in the middle cerebral arteries is primarily attributed to motion artifact; the density the right MCA appears relatively similar to the prior exam, and the left is somewhat obscured by the motion artifact.  Left scleral band noted.  IMPRESSION:  1.  Right periventricular white matter hypodensities adjacent to the right lateral ventricle likely reflect evolutionary findings from prior lacunar infarcts shown in May 2013.  These are somewhat indistinct against the background of chronic microvascular white matter disease.  No definite new infarct, mass lesion, or intracranial hemorrhage is apparent on today's CT scan.  These results were called by telephone on 11/23/2011 at 2:07 p.m. to Dr. Noel Christmas, who verbally acknowledged these results.   Original Report Authenticated By: Dellia Cloud, M.D.    Dg Chest Port 1 View  11/23/2011  *RADIOLOGY REPORT*  Clinical Data: Code stroke  PORTABLE CHEST - 1 VIEW  Comparison:  09/14/2011  Findings: Mild cardiomegaly.  Clear lungs.  No pneumothorax or pleural effusion. Normal vascularity.  IMPRESSION: Cardiomegaly without edema.   Original Report Authenticated By: Donavan Burnet, M.D.      1. Stroke   2. Renal insufficiency   3. Hypotension     Date: 11/23/2011  Rate: 66  Rhythm: normal sinus rhythm  QRS Axis: normal  Intervals: normal  ST/T Wave abnormalities: normal  Conduction Disutrbances:none  Narrative Interpretation:   Old EKG Reviewed: unchanged  CRITICAL CARE Performed by: Billee Cashing   Total critical care time: 30  Critical care time was exclusive of separately billable procedures and treating other patients.  Critical care was necessary to treat or prevent imminent or life-threatening deterioration.  Critical care was time spent personally by me on the following activities: development of treatment plan with patient and/or surrogate as well as nursing, discussions with consultants, evaluation of patient's response to treatment, examination of patient, obtaining history from patient or surrogate, ordering and performing treatments and interventions, ordering and review of laboratory studies, ordering and review of radiographic studies, pulse oximetry and re-evaluation of patient's condition.   MDM  Patient presented with possible code stroke. Code stroke was activated by me. His head CT did not show a bleed. He was seen by neurology and decided not to be a TPA candidate. He does have known carotid disease. He is on Coumadin and has an INR of 4.5. He also has a worsening renal function. He has had some hypotension this improved with a fluid bolus. Patient is not a TPA candidate to be admitted to medicine for further evaluation and treatment.       Juliet Rude. Rubin Payor, MD 11/23/11 1550

## 2011-11-23 NOTE — Progress Notes (Signed)
ANTICOAGULATION CONSULT NOTE - Initial Consult  Pharmacy Consult for coumadin Indication: atrial fibrillation  Allergies  Allergen Reactions  . Diltiazem Hcl Other (See Comments)    unknown  . Oxycodone-Acetaminophen Other (See Comments)    unknown    Patient Measurements: Height: 5\' 11"  (180.3 cm) Weight: 243 lb 9.6 oz (110.496 kg) IBW/kg (Calculated) : 75.3    Vital Signs: Temp: 98 F (36.7 C) (08/22 1452) Temp src: Oral (08/22 1332) BP: 114/70 mmHg (08/22 2030) Pulse Rate: 81  (08/22 2030)  Labs:  Basename 11/23/11 1401 11/23/11 1348  HGB 14.6 13.6  HCT 43.0 39.9  PLT -- 156  APTT -- 55*  LABPROT -- 43.8*  INR -- 4.55*  HEPARINUNFRC -- --  CREATININE 3.70* 3.41*  CKTOTAL -- 542*  CKMB -- 8.2*  TROPONINI -- <0.30    Estimated Creatinine Clearance: 24.2 ml/min (by C-G formula based on Cr of 3.7).   Medical History: Past Medical History  Diagnosis Date  . CAD (coronary artery disease) CABG in 2004    F/U by Dr Genia Harold cardiology, cardiolite scan done elsewhere in October 2010 that was normal, no ischemia seen, EF 55-65% in 02/2005  . GERD (gastroesophageal reflux disease)   . Hypertension   . Hyperlipidemia   . Renal insufficiency   . Cellulitis and abscess of leg     right leg  . Tobacco abuse   . Chronic back pain   . Chronic neck pain   . Seborrheic dermatitis of scalp   . Abnormal LFTs     Hx of abnormal LFT's  . Bradycardia   . AAA (abdominal aortic aneurysm)     November 2010, Stenting, November, 2010  . Paroxysmal atrial fibrillation 02/22/2009    diltiazem po started but discontinued due to side effects.  . Stroke   . Ejection fraction     EF 40% in the past  /   EF 55-65% echo, November, 2006  . Hx of CABG     2004  . Carotid artery disease     Doppler, October, 2012, and old LICA occlusion , RICA no significant abnormality  . Arthritis   . Prediabetes   . CVA (cerebral infarction)     R Carona radiata stroke 08/16/2011     Medications:  Prescriptions prior to admission  Medication Sig Dispense Refill  . ibuprofen (ADVIL,MOTRIN) 200 MG tablet Take 200 mg by mouth every 6 (six) hours as needed. For pain      . lisinopril (PRINIVIL,ZESTRIL) 10 MG tablet Take 1 tablet (10 mg total) by mouth daily.  60 tablet  3  . Multiple Vitamins-Minerals (MULTIVITAMIN WITH MINERALS) tablet Take 1 tablet by mouth daily.      . sildenafil (VIAGRA) 100 MG tablet Take 100 mg by mouth daily as needed. For erectile dysfunction       . simvastatin (ZOCOR) 10 MG tablet Take 10 mg by mouth at bedtime.      Marland Kitchen warfarin (COUMADIN) 5 MG tablet Take 1 tablet (5 mg total) by mouth daily. 90 day supply  105 tablet  1    Assessment: Dylan Morgan is a 66 yo man admitted with left sided weakness.  He was recently  started on coumadin in July for PAF.  His PMH is significant for CAD s/p CABG in 2004, multiple CVAs with on neurologic deficits, CKD stage II, HTN, OSA not on CPAP, and Paroxysmal Afib.  His home dose of coumadin is 5 mg daily and his last dose  was yesterday, 8/21. His INRs on admission are 3.41 and 3.7. His H/H is WNL but his platelets are on the low end of normal and have been low in the past.  His admission INR is > than the 2-3 goal.  No bleeding reported. Hi is followed at the [facility].    Goal of Therapy:  INR 2-3    Plan:  1. No coumadin tonight 2. Daily INR Herby Abraham, Pharm.D. [phone] 11/23/2011 9:57 PM

## 2011-11-23 NOTE — ED Notes (Signed)
MD at bedside. 

## 2011-11-23 NOTE — ED Notes (Signed)
Pt reports (L) arm numbness/heaviness starting 1100 this am, pt presents w/(L) eye facial droop and (L) eye droop. Grips equally bilaterally, pt reports hx of cva

## 2011-11-23 NOTE — H&P (Signed)
Hospital Admission Note Date: 11/23/2011  Patient name: Dylan Morgan Medical record number: [id number] Date of birth: 1945-10-06 Age: 66 y.o. Gender: male PCP: Denton Ar, MD  Medical Service:  Internal Medicine Teaching Service Contact Information  1st Contact: Dr. Leonia Reeves Pager: [phone] 2nd Contact: Dr. Bard Herbert Pager: [phone] After 5 pm or weekends: 1st Contact: Pager: [phone] 2nd Contact: Pager: [phone]  Chief Complaint: Left sided weakness  History of Present Illness: Dylan Morgan is a 66 year-old man with PMH significant for CAD s/p CABG in 2004, multiple CVAs with on neurologic deficits,  CKD stage II, HTN, OSA not on CPAP, and Paroxysmal Afib on coumadin for last two months who presented to the [facility] ED for evaluation of new left sided weakness. He was in his usual state of health this morning, approximately 10 AM when he started feeling left sided weakness in his arm, leg and face. He was at work at that time. He drove himself home, but noticed that the left weakness progressively worsened and a friend drove him to the [facility] ED. At this time he reports that his weakness has improved significantly.   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 He has had chronic back pain and denies severe low back pain at this time.   He has occasional headaches that last for one hour but subside with Advil. The headache is described as a dull ache, frontal, bilateral, with no photophobia, or increased lacrimation.   He denies headache, confusion, chest pain, shortness of breath, abdominal pain, no increased or decreased urinary frequency, hematuria, or blood in his stools.   Meds: Current Outpatient Rx  Name Route Sig Dispense Refill  . IBUPROFEN 200 MG PO TABS Oral Take 200 mg by mouth every 6 (six) hours as needed. For pain    . LISINOPRIL 10 MG PO TABS Oral Take 1 tablet (10 mg  total) by mouth daily. 60 tablet 3  . MULTI-VITAMIN/MINERALS PO TABS Oral Take 1 tablet by mouth daily.    Marland Kitchen SILDENAFIL CITRATE 100 MG PO TABS Oral Take 100 mg by mouth daily as needed. For erectile dysfunction     . SIMVASTATIN 10 MG PO TABS Oral Take 10 mg by mouth at bedtime.    . WARFARIN SODIUM 5 MG PO TABS Oral Take 1 tablet (5 mg total) by mouth daily. 90 day supply 105 tablet 1    Allergies: Allergies as of 11/23/2011 - Review Complete 11/23/2011  Allergen Reaction Noted  . Diltiazem hcl Other (See Comments)   . Oxycodone-acetaminophen Other (See Comments)    Past Medical History  Diagnosis Date  . CAD (coronary artery disease) CABG in 2004    F/U by Dr Genia Harold cardiology, cardiolite scan done elsewhere in October 2010 that was normal, no ischemia seen, EF 55-65% in 02/2005  . GERD (gastroesophageal reflux disease)   . Hypertension   . Hyperlipidemia   . Renal insufficiency   . Cellulitis and abscess of leg     right leg  . Tobacco abuse   . Chronic back pain   . Chronic neck pain   . Seborrheic dermatitis of scalp   . Abnormal LFTs     Hx of abnormal LFT's  . Bradycardia   . AAA (abdominal aortic aneurysm)     November 2010, Stenting, November, 2010  . Paroxysmal atrial fibrillation 02/22/2009  diltiazem po started but discontinued due to side effects.  . Stroke   . Ejection fraction     EF 40% in the past  /   EF 55-65% echo, November, 2006  . Hx of CABG     2004  . Carotid artery disease     Doppler, October, 2012, and old LICA occlusion , RICA no significant abnormality  . Arthritis   . Prediabetes   . CVA (cerebral infarction)     R Carona radiata stroke 08/16/2011   Past Surgical History  Procedure Date  . Coronary artery bypass graft 2004  . Rotator cuff repair    Family History  Problem Relation Age of Onset  . Diabetes Mother   . Stroke Father   . Cancer Sister     thyroid cancer  . Heart disease Brother     Coronary artery disease    History   Social History  . Marital Status: Single    Spouse Name: N/A    Number of Children: N/A  . Years of Education: N/A   Occupational History  . disabled    Social History Main Topics  . Smoking status: Former Smoker    Types: Cigarettes    Quit date: 02/03/2010  . Smokeless tobacco: Never Used  . Alcohol Use: No  . Drug Use: No  . Sexually Active: No   Other Topics Concern  . Not on file   Social History Narrative   Lives with son and his significant other, denies being married. Has 3 daughters & a son.  Several grandchildren. Divorced.Regular exercise.Prior to retirement, hung dry wall for a living.     Review of Systems: All negative except as noted in HPI  Physical Exam: Blood pressure 138/69, pulse 54, temperature 98 F (36.7 C), temperature source Oral, resp. rate 19, SpO2 97.00%. Vitals reviewed. General: resting in bed, in NAD HEENT: no scleral icterus.  Cardiac: RRR, no rubs, no murmurs or gallops, could not access JVD or abdominal bruit 2/2 body habitus. Pulm: clear to auscultation bilaterally, no wheezes, rales, or rhonchi MSK: lumbar paraspinal tenderness with no bruising Abd: obese, soft, nontender, nondistended, BS present Skin: Diffuse ecchymosis over his forearms.  Ext: warm and well perfused, pedal pulses 2+bilaterally, 1+ pretibial edema bilaterally.  Neuro: alert and oriented X3,  cranial nerves II. OS decreased visual fields but this is chronic s/p trauma in 2002. OD normal visual fields III/IV/VI:PERRL, EOMI V/VII-no facial numbness of weakness VIII grossly intact IX-did not assess X-slightly dysarthric speech but could be baseline XI-symmetric shoulder shrug XII-midline tongue extension Strength 5/5 in UE and LE. Intact touch sensation in UE and LE.   Lab results: Basic Metabolic Panel:  Basename 11/23/11 1401 11/23/11 1348  NA 138 137  K 4.8 4.9  CL 106 102  CO2 -- 21  GLUCOSE 125* 127*  BUN 52* 50*  CREATININE 3.70*  3.41*  CALCIUM -- 10.5  MG -- --  PHOS -- --   Liver Function Tests:  Basename 11/23/11 1348  AST 36  ALT 23  ALKPHOS 83  BILITOT 0.3  PROT 8.3  ALBUMIN 4.2   CBC:  Basename 11/23/11 1401 11/23/11 1348  WBC -- 6.3  NEUTROABS -- 4.0  HGB 14.6 13.6  HCT 43.0 39.9  MCV -- 89.3  PLT -- 156   Cardiac Enzymes:  Basename 11/23/11 1348  CKTOTAL 542*  CKMB 8.2*  CKMBINDEX --  TROPONINI <0.30    Imaging results:  Ct Head Wo Contrast  11/23/2011  *RADIOLOGY REPORT*  Clinical Data: Code stroke.  Left-sided parasthesias.  CT HEAD WITHOUT CONTRAST  Technique:  Contiguous axial images were obtained from the base of the skull through the vertex without contrast.  Comparison: 08/17/2011  Findings: Despite efforts by the patient and technologist, motion artifact is present on some series of today's examination and could not be totally eliminated.  This reduces diagnostic sensitivity and specificity.  The brain stem, cerebellum, cerebral peduncles, basal ganglia, thalami, lateral ventricles, and basilar cisterns appear unremarkable.  Periventricular and corona radiata white matter hypodensities are most compatible with chronic ischemic microvascular white matter disease.  Faintly increased periventricular white matter hypodensity on the right, for example on image 19 of series 2, is likely a manifestation of the prior lacunar infarcts in this vicinity shown on MRI from 08/17/2011.  Otherwise no significant change.  No intracranial hemorrhage or mass lesion noted.  Slight asymmetry in density in the middle cerebral arteries is primarily attributed to motion artifact; the density the right MCA appears relatively similar to the prior exam, and the left is somewhat obscured by the motion artifact.  Left scleral band noted.  IMPRESSION:  1.  Right periventricular white matter hypodensities adjacent to the right lateral ventricle likely reflect evolutionary findings from prior lacunar infarcts shown in  May 2013.  These are somewhat indistinct against the background of chronic microvascular white matter disease.  No definite new infarct, mass lesion, or intracranial hemorrhage is apparent on today's CT scan.  These results were called by telephone on 11/23/2011 at 2:07 p.m. to Dr. Noel Christmas, who verbally acknowledged these results.   Original Report Authenticated By: Dellia Cloud, M.D.    Dg Chest Port 1 View  11/23/2011  *RADIOLOGY REPORT*  Clinical Data: Code stroke  PORTABLE CHEST - 1 VIEW  Comparison: 09/14/2011  Findings: Mild cardiomegaly.  Clear lungs.  No pneumothorax or pleural effusion. Normal vascularity.  IMPRESSION: Cardiomegaly without edema.   Original Report Authenticated By: Donavan Burnet, M.D.     Other results: EKG: sinus rhythm, poor R wave progression, no ST changes. No significant changes from previous EKG.  Assessment & Plan by Problem: Mr. Wessinger is a 66 year-old man with PMH significant for previous CVA in 9/12, TIA in 5/13, paroxysmal Afib on anticoagulation, CAD, HTN, hyperlipidemia, left internal carotid occlusion and moderate stenosis of the right internal carotid who presented with Left side weakness starting at around 10AM on admission day.   CVA/TIA. His initial NIHSS was 4. His symptoms started today at 10 AM and he presented to the ED >4 hours after initiation of his symptoms and he is on coumadin. He is, therefore, not a candidate for tPA.  His INR is supratherapeutic and he does report a fall yesterday raising concern for a hemorraghic stroke. However, his CT head without contrast shows no acute intracranial abnormality such as acute or subacute bleeding. He has had CVAs and at least one TIA in the past, as recent as May 2013 involving his right subcortical middle cerebral artery. Neurology saw him while at the ED and suspect probable recurrent TIA involving the right subcortical MCA though recurrent subcortical right MCA cannot be ruled out. His  management and treatment will be focused on risk stratification and modification. His identified stroke risk factors are Afib, carotid artery stenosis, hyperlipidemia, and hypertension.  - Admit to telemetry bed - Neuro checks with vital signs - Continue simvastatin 10mg   - Check HgbA1c, fasting lipid panel  - MRI,  MRA of the brain without contrast  - PT consult, OT consult, Speech consult  - Carotid dopplers  - Prophylactic therapy-Anticoagulation: Coumadin-Pharmacy to manage  - Risk factor modification  - UDS  Paroxysmal A fib. Pt is followed by Dr. Myrtis Ser at [facility] and was recently diagnosed with paroxymal Afib and was started on coumadin. He is followed at the [facility] for his therapy. At this presentation he does not have Afib, however his INR is supratherapeutic at 4.55. He reports decreased PO intake in the past two days and his renal function may have declined from baseline, with potential reduced excretion of warfarin. He denies bleeding but has noticed easy bruising.  -Warfarin per pharmacy -Will consider vitamin K if sings of bleeding or FFP + vitamin K if major bleeding.   CAD/carotid artery disease. He has known complete occlusion of the left internal carotid and moderate stenosis of right internal carotid. He is s/p CABG in 2006, although we cannot find records at this time. He is followed by Dr. Myrtis Ser in [facility] cardiology. His last echo was in 2006.  -lipid panel -HgA1 -2D Echo -carotid artery dopplers  Recent fall. He reports falling from a ladder (~ 4 feet heigth) yesterday while working/helping a friend. He denies severe pain, he feels sore with no bruises or hematuria. He has paraspinal musculoskeletal pain today but his total Ck and CKMB are elevated. This could be a component of MSK injury and decreased PO, possibly pre-renal azotemia. Given his supratherapeutic INR and AAA with stent we will continue to monitor H/H and bleeding.  -Will continue to  monitor for worsening back pain.  -Will cycle CE -Ordered UA  -AM CBC  AAA. Pt with hx of abdominal aortic aneurysm with stent in 2010. Pt with no abdominal pain. Will consider abdominal US if worsening back pain, abdominal pain, decreased pedal pulses, hypotension, or drop in H/H.   Acute on chronic renal failure (CKD stage II). His baseline creatinine appears to be ~3.6 although it has ranged from 1.7 to 4.19 in the last 2 months. He reports decreased PO intake and increased sweating for the last day and could certainly have prerenal azothermia. He appears euvolemic on physical exam, however, with pretibial edema but we will continue to monitor for volume status and consider hydration.  -AM renal function panel -Urine sodium, cr, osmolality  HTN. BP initially low at 101/57 while in ED. Pt reports decreased PO intake and increased sweating 2/2 working outdoors and continued compliance with lisinopril. His BP increased to 138/69 with no fluid boluses. Given his recent TIA/CVA, mild hypertension will be permitted.   -Hold BP meds while pt hypotensive.  -Will monitor and resume lisinopril 10mg  if hypertensive.   GERD. Pt with history of GERD but no complaints on admission and not on medications at home for this problem. Will continue to monitor.   DVT prophylaxis: SCDs  Signed: Ky Barban 11/23/2011, 7:45 PM

## 2011-11-23 NOTE — Code Documentation (Signed)
66 year old male who has had previous stroke and on Coumadin for AF, presents at 38 to [facility] ED via pvt vehicle.  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  He went under a house at 1000, and when he came out at 1045 he noticed he was staggering. He drove himself home, changed clothes and a friend drove him to the ED.  EDP saw the pt at 1334. He was notice to have L facial droop by EDP and some parasthesia, and code stroke was activated at 1338.  LKW is 1000.  Stroke team arrived at 1342.  CT showed no acute abnormality.  Exam reveals NIHSS=4:  One for missed his age, two for very mild ataxia LUE and LLE, and mild dysarthria (but the pt is edentulous).  He is outside the window for tPA (on coumadin) and sx are too mild.  Code stroke was canceled at 1418.

## 2011-11-24 ENCOUNTER — Ambulatory Visit: Payer: Medicare Other | Admitting: Cardiology

## 2011-11-24 ENCOUNTER — Inpatient Hospital Stay (HOSPITAL_COMMUNITY): Payer: Medicare Other

## 2011-11-24 DIAGNOSIS — I517 Cardiomegaly: Secondary | ICD-10-CM

## 2011-11-24 DIAGNOSIS — G459 Transient cerebral ischemic attack, unspecified: Principal | ICD-10-CM

## 2011-11-24 DIAGNOSIS — I2510 Atherosclerotic heart disease of native coronary artery without angina pectoris: Secondary | ICD-10-CM

## 2011-11-24 LAB — CBC
HCT: 35.3 % — ABNORMAL LOW (ref 39.0–52.0)
Hemoglobin: 11.9 g/dL — ABNORMAL LOW (ref 13.0–17.0)
MCH: 30.4 pg (ref 26.0–34.0)
MCHC: 33.7 g/dL (ref 30.0–36.0)
MCV: 90.3 fL (ref 78.0–100.0)
Platelets: 109 10*3/uL — ABNORMAL LOW (ref 150–400)
RBC: 3.91 MIL/uL — ABNORMAL LOW (ref 4.22–5.81)
RDW: 13.5 % (ref 11.5–15.5)
WBC: 3.2 10*3/uL — ABNORMAL LOW (ref 4.0–10.5)

## 2011-11-24 LAB — LIPID PANEL
LDL Cholesterol: UNDETERMINED mg/dL (ref 0–99)
Triglycerides: 445 mg/dL — ABNORMAL HIGH (ref <150)
VLDL: UNDETERMINED mg/dL (ref 0–40)

## 2011-11-24 LAB — CARDIAC PANEL(CRET KIN+CKTOT+MB+TROPI)
CK, MB: 4.4 ng/mL — ABNORMAL HIGH (ref 0.3–4.0)
Relative Index: 1.6 (ref 0.0–2.5)
Troponin I: <0.3 ng/mL (ref <0.30)
Troponin I: <0.3 ng/mL (ref <0.30)

## 2011-11-24 LAB — OSMOLALITY, URINE: Osmolality, Ur: 613 mOsm/kg (ref 390–1090)

## 2011-11-24 LAB — BASIC METABOLIC PANEL
BUN: 45 mg/dL — ABNORMAL HIGH (ref 6–23)
CO2: 21 mEq/L (ref 19–32)
Calcium: 8.7 mg/dL (ref 8.4–10.5)
Chloride: 106 mEq/L (ref 96–112)
Creatinine, Ser: 2.02 mg/dL — ABNORMAL HIGH (ref 0.50–1.35)

## 2011-11-24 LAB — GLUCOSE, CAPILLARY
Glucose-Capillary: 97 mg/dL (ref 70–99)
Glucose-Capillary: 96 mg/dL (ref 70–99)

## 2011-11-24 LAB — MRSA PCR SCREENING: MRSA by PCR: NEGATIVE

## 2011-11-24 LAB — PROTIME-INR: INR: 4.9 — ABNORMAL HIGH (ref 0.00–1.49)

## 2011-11-24 LAB — HEMOGLOBIN A1C: Hgb A1c MFr Bld: 5.8 % — ABNORMAL HIGH (ref <5.7)

## 2011-11-24 NOTE — Progress Notes (Signed)
Occupational Therapy Evaluation Patient Details Name: Dylan Morgan MRN: [id number] DOB: 12/30/1945 Today's Date: 11/24/2011 Time: 4098-1191 OT Time Calculation (min): 17 min  OT Assessment / Plan / Recommendation Clinical Impression  Pt admitted with PMH significant for CAD s/p CABG in 2004, multiple CVAs with on neurologic deficits, CKD stage II, HTN, OSA not on CPAP, and Paroxysmal Afib on coumadin for last two months who presented to the [facility] ED for evaluation of new left sided weakness. CT findings do not show acute infarct.  Limited mobility during evaluation due to INR of 4.9  However, pt able to perform BADLs and functional mobility at supervision level. Educated pt on stroke signs and symptoms. No further acute OT needed.    OT Assessment  Patient does not need any further OT services    Follow Up Recommendations  Supervision - Intermittent;No OT follow up    Barriers to Discharge      Equipment Recommendations  None recommended by OT    Recommendations for Other Services    Frequency       Precautions / Restrictions Precautions Precautions: Fall Restrictions Weight Bearing Restrictions: No   Pertinent Vitals/Pain See vitals    ADL  Eating/Feeding: Performed;Independent Where Assessed - Eating/Feeding: Edge of bed Lower Body Dressing: Performed;Supervision/safety Where Assessed - Lower Body Dressing: Unsupported sit to stand Toilet Transfer: Simulated;Supervision/safety Toilet Transfer Method: Sit to Barista:  (chair) Equipment Used: Gait belt Transfers/Ambulation Related to ADLs: supervision with ambulation in room.  Pt used IV pole for support. ADL Comments: Educated pt on stroke signs and symptoms. Pt reports no difficulty with LUE when performing ADL tasks.  Pt supervision with functional mobility throughout room (limited mobility due to INR).  Pt used IV pole for support but has canes at home that he can use as needed.      OT Diagnosis:     OT Problem List:   OT Treatment Interventions:     OT Goals    Visit Information  Last OT Received On: 11/24/11 Assistance Needed: +1 PT/OT Co-Evaluation/Treatment: Yes    Subjective Data      Prior Functioning  Vision/Perception  Home Living Lives With:  (girlfriend) Available Help at Discharge: Family Type of Home: House Home Access: Stairs to enter Secretary/administrator of Steps: 4 Entrance Stairs-Rails: Right;Left Home Layout: Two level;Bed/bath upstairs Alternate Level Stairs-Number of Steps: 14 Bathroom Shower/Tub: Engineer, manufacturing systems: Standard Home Adaptive Equipment: Straight cane Prior Function Level of Independence: Independent Able to Take Stairs?: Yes Driving: Yes Vocation: Retired Musician: No difficulties Dominant Hand: Right      Cognition  Overall Cognitive Status: Appears within functional limits for tasks assessed/performed Arousal/Alertness: Awake/alert Orientation Level: Appears intact for tasks assessed Behavior During Session: [facility] for tasks performed    Extremity/Trunk Assessment Right Upper Extremity Assessment RUE ROM/Strength/Tone: [facility] for tasks assessed;Deficits RUE ROM/Strength/Tone Deficits: 3+5 shoulder abduction (baseline) otherwise 5/5 throughout Left Upper Extremity Assessment LUE ROM/Strength/Tone: Within functional levels Right Lower Extremity Assessment RLE ROM/Strength/Tone: [facility] for tasks assessed Left Lower Extremity Assessment LLE ROM/Strength/Tone: [facility] for tasks assessed   Mobility Bed Mobility Bed Mobility: Not assessed Details for Bed Mobility Assistance: Pt sitting EOB at presentation. Transfers Sit to Stand: 5: Supervision Stand to Sit: 5: Supervision Details for Transfer Assistance: Pt slightly impulsive and required verbal cueing to not tangle in the IV when turning.   Exercise    Balance   End of Session OT - End of Session Equipment Utilized During  Treatment: Gait  belt Activity Tolerance: Patient tolerated treatment well (limited mobility due to INR) Patient left: in chair;with call bell/phone within reach;with family/visitor present Nurse Communication: Mobility status  GO    11/24/2011 Cipriano Mile OTR/L Pager [phone] Office [phone]  Cipriano Mile 11/24/2011, 4:53 PM

## 2011-11-24 NOTE — Progress Notes (Signed)
ANTICOAGULATION CONSULT NOTE - Follow Up Consult  Pharmacy Consult for Coumadin Indication: atrial fibrillation  Allergies  Allergen Reactions  . Diltiazem Hcl Other (See Comments)    unknown  . Oxycodone-Acetaminophen Other (See Comments)    unknown    Patient Measurements: Height: 5\' 11"  (180.3 cm) Weight: 243 lb 9.6 oz (110.496 kg) IBW/kg (Calculated) : 75.3  Heparin Dosing Weight:    Vital Signs: Temp: 97.3 F (36.3 C) (08/23 0917) Temp src: Oral (08/23 0917) BP: 116/59 mmHg (08/23 0917) Pulse Rate: 50  (08/23 0917)  Labs:  Basename 11/24/11 0915 11/24/11 0332 11/23/11 2136 11/23/11 1401 11/23/11 1348  HGB 11.9* -- -- 14.6 --  HCT 35.3* -- -- 43.0 39.9  PLT 109* -- -- -- 156  APTT -- -- -- -- 55*  LABPROT -- 46.4* -- -- 43.8*  INR -- 4.90* -- -- 4.55*  HEPARINUNFRC -- -- -- -- --  CREATININE 2.02* -- -- 3.70* 3.41*  CKTOTAL 273* 371* 413* -- --  CKMB 4.4* 5.3* 6.3* -- --  TROPONINI <0.30 <0.30 <0.30 -- --    Estimated Creatinine Clearance: 44.3 ml/min (by C-G formula based on Cr of 2.02).   Medications:  Prescriptions prior to admission  Medication Sig Dispense Refill  . ibuprofen (ADVIL,MOTRIN) 200 MG tablet Take 200 mg by mouth every 6 (six) hours as needed. For pain      . lisinopril (PRINIVIL,ZESTRIL) 10 MG tablet Take 1 tablet (10 mg total) by mouth daily.  60 tablet  3  . Multiple Vitamins-Minerals (MULTIVITAMIN WITH MINERALS) tablet Take 1 tablet by mouth daily.      . sildenafil (VIAGRA) 100 MG tablet Take 100 mg by mouth daily as needed. For erectile dysfunction       . simvastatin (ZOCOR) 10 MG tablet Take 10 mg by mouth at bedtime.      Marland Kitchen warfarin (COUMADIN) 5 MG tablet Take 1 tablet (5 mg total) by mouth daily. 90 day supply  105 tablet  1    Assessment: Dylan Morgan is a 66 yo man admitted with left sided weakness. He was recently started on coumadin in July for PAF. Admit INR 4.55 on 5mg  daily.  PMH is significant for CAD s/p CABG in 2004,  multiple CVAs with no neurologic deficits, CKD stage II, HTN, OSA not on CPAP, and Paroxysmal Afib  Anticoag: Patient notes falling from a ladder a few days ago now with paraspinal muscle pain and increased CK/CKMB. INR today remains elevated at 4.9. Hgb 11.9 (down) and platelets have fallen to 109 from baseline 156?  Cards: h/o AAA with stent 2010, CAD, HTN, on Zocor  GI/Nutrition: GERD.  Renal: CKD. Scr 2.02. Takes lisinopril at home (on hold)  Goal of Therapy:  INR 2-3 Monitor platelets by anticoagulation protocol: Yes   Plan:  Continue to hold Coumadin  Merilynn Finland, [employer] 11/24/2011,11:36 AM

## 2011-11-24 NOTE — Evaluation (Signed)
Speech Language Pathology Evaluation Patient Details Name: Dylan Morgan MRN: [id number] DOB: December 04, 1945 Today's Date: 11/24/2011 Time: 1325-     Problem List:  Patient Active Problem List  Diagnosis  . DEPRESSIVE DISORDER  . GERD  . Acute on chronic renal failure (baseline Stage II)  . DERMATITIS, SEBORRHEIC  . BACK PAIN, CHRONIC  . SLEEP APNEA  . Erectile dysfunction  . Leg cramps, sleep related  . CAD (coronary artery disease)  . Hypertension  . Hyperlipidemia  . Bradycardia  . Paroxysmal atrial fibrillation  . Ejection fraction  . Hx of CABG  . AAA (abdominal aortic aneurysm)  . Encounter for long-term (current) use of anticoagulants  . Carotid artery disease  . Prediabetes  . CVA (cerebral infarction)  . Cellulitis of right anterior lower leg  . Preventative health care  . TIA (transient ischemic attack)   Past Medical History:  Past Medical History  Diagnosis Date  . CAD (coronary artery disease) CABG in 2004    F/U by Dr Genia Harold cardiology, cardiolite scan done elsewhere in October 2010 that was normal, no ischemia seen, EF 55-65% in 02/2005  . GERD (gastroesophageal reflux disease)   . Hypertension   . Hyperlipidemia   . Renal insufficiency   . Cellulitis and abscess of leg     right leg  . Tobacco abuse   . Chronic back pain   . Chronic neck pain   . Seborrheic dermatitis of scalp   . Abnormal LFTs     Hx of abnormal LFT's  . Bradycardia   . AAA (abdominal aortic aneurysm)     November 2010, Stenting, November, 2010  . Paroxysmal atrial fibrillation 02/22/2009    diltiazem po started but discontinued due to side effects.  . Stroke   . Ejection fraction     EF 40% in the past  /   EF 55-65% echo, November, 2006  . Hx of CABG     2004  . Carotid artery disease     Doppler, October, 2012, and old LICA occlusion , RICA no significant abnormality  . Arthritis   . Prediabetes   . CVA (cerebral infarction)     R Carona radiata stroke 08/16/2011     Past Surgical History:  Past Surgical History  Procedure Date  . Coronary artery bypass graft 2004  . Rotator cuff repair    HPI:  66 yr old admitted iwth left side weakness.  CT revealed old infarct (08/1011), no acute abnormality.     Assessment / Plan / Recommendation Clinical Impression  Pt.'s language, speech and cognition were WFL's for items assessed.  No ST needs at this time.  Pt. has had recent stroke, therefor reviewed strategies to assist with working memory and organization if difficulty arises in the future.    SLP Assessment  Patient does not need any further Speech Lanaguage Pathology Services    Follow Up Recommendations  None    Frequency and Duration             SLP Evaluation Prior Functioning  Cognitive/Linguistic Baseline: Within functional limits Type of Home: House Lives With:  (girlfriend) Available Help at Discharge: Friend(s) Vocation: Retired (hung dry wall)   Cognition  Overall Cognitive Status: Appears within functional limits for tasks assessed Arousal/Alertness: Awake/alert    Comprehension  Auditory Comprehension Overall Auditory Comprehension: Appears within functional limits for tasks assessed Visual Recognition/Discrimination Discrimination: Not tested Reading Comprehension Reading Status:  (pt. reports inability to read)  Expression Expression Primary Mode of Expression: Verbal Verbal Expression Overall Verbal Expression: Appears within functional limits for tasks assessed Pragmatics: No impairment Written Expression Dominant Hand: Right Written Expression: Not tested   Oral / Motor Oral Motor/Sensory Function Overall Oral Motor/Sensory Function: Appears within functional limits for tasks assessed Motor Speech Overall Motor Speech: Appears within functional limits for tasks assessed Intelligibility: Intelligible Motor Planning: Witnin functional limits   Breck Coons [employer]  [phone]  11/24/2011

## 2011-11-24 NOTE — Progress Notes (Signed)
RN was notified of abnormal pulse  

## 2011-11-24 NOTE — Progress Notes (Signed)
RN was notified about abnormal BP and pulse

## 2011-11-24 NOTE — Care Management Note (Signed)
    Page 1 of 1   11/27/2011     10:06:03 AM   CARE MANAGEMENT NOTE 11/27/2011  Patient:  Dylan Morgan, Dylan Morgan   Account Number:  [id number]  Date Initiated:  11/24/2011  Documentation initiated by:  Onnie Boer  Subjective/Objective Assessment:   PT WAS ADMITTED WITH WEAKNESS     Action/Plan:   PROGRESSION OF CARE AND DISCHARGE PLANNING   Anticipated DC Date:  11/26/2011   Anticipated DC Plan:           Choice offered to / List presented to:             Status of service:  Completed, signed off Medicare Important Message given?   (If response is "NO", the following Medicare IM given date fields will be blank) Date Medicare IM given:   Date Additional Medicare IM given:    Discharge Disposition:  HOME/SELF CARE  Per UR Regulation:  Reviewed for med. necessity/level of care/duration of stay  If discussed at Long Length of Stay Meetings, dates discussed:    Comments:  11/24/11 Onnie Boer, RN, BSN  1246 PT WAS ADMITTED WITH WEAKNESS.  PTA PT WAS AT HOME WITH SELF CARE.  WILL F/U ON DC NEEDS AND PT/OT RECOMMENDATIONS.

## 2011-11-24 NOTE — Evaluation (Signed)
Physical Therapy Evaluation Patient Details Name: Dylan Morgan MRN: [id number] DOB: 05-Jul-1945 Today's Date: 11/24/2011 Time: 7829-5621 PT Time Calculation (min): 38 min  PT Assessment / Plan / Recommendation Clinical Impression  Pt is a 66 yo male admitted with PMH significant for CAD s/p CABG in 2004, multiple CVAs with on neurologic deficits,  CKD stage II, HTN, OSA not on CPAP, and Paroxysmal Afib on coumadin for last two months who presented to the [facility] ED for evaluation of new left sided weakness.  No definite new infarct, mass lesion, intracranial hemorrhage is apparent on today's CT scan per pt's chart. Pt's evaluation limited by INR of 4.9, nursing cleared him for evaluation, and high level balance testing and amnulation limited due to increased bleeding risk. Pt presented to physical therapy evaluation with gait deviations and decreased balance. Recommend pt use cane on discharge for improved stability (pt reports 2 at home). Educated pt on his risk of falls especially with high INR and coumadin.  Pt verbalized understanding. Pt will benefit from physical therapy in the acute care setting to address deficits and for gait training. Recommending OPPT at discharge.    PT Assessment  Patient needs continued PT services    Follow Up Recommendations  Outpatient PT;Supervision for mobility/OOB    Barriers to Discharge        Equipment Recommendations    None recommended by PT   Recommendations for Other Services     Frequency Min 3X/week    Precautions / Restrictions Precautions Precautions: Fall, INR 4.9 Restrictions Weight Bearing Restrictions: No         Mobility  Bed Mobility Bed Mobility: Not assessed Details for Bed Mobility Assistance: Pt sitting EOB at presentation. Transfers Transfers: Sit to Stand;Stand to Sit Sit to Stand: 5: Supervision Stand to Sit: 5: Supervision Details for Transfer Assistance: Pt slightly impulsive and required verbal cueing to not tangle in  the IV when turning. Ambulation/Gait Ambulation/Gait Assistance: 5: Supervision/mingaurdA Ambulation Distance (Feet): 25, 20 Feet Assistive device: None;Straight cane Ambulation/Gait Assistance Details:  Pt ambulated 22ft without AD and occasionally reached for IV pole for support. Pt complained of hip pain that caused slightly antalgic gait with increased lateral trunk lean. Pt slightly impulsive and had a few small LOB that he self corrected. Attempted straight cane for 20 ft for improved balance and to decrease weight bearing on hip, which improved pt's gait quality and decreased fall risk. Cues for safe technique for utilization of SPC. Gait Pattern: Antalgic;Wide base of support;Trunk flexed;Decreased stride length Stairs: No    Exercises     PT Diagnosis: Difficulty walking;Abnormality of gait  PT Problem List: Decreased strength;Decreased activity tolerance;Decreased balance;Decreased mobility;Decreased knowledge of use of DME;Decreased safety awareness PT Treatment Interventions: DME instruction;Gait training;Stair training;Functional mobility training;Therapeutic activities;Therapeutic exercise;Balance training;Neuromuscular re-education;Patient/family education   PT Goals Acute Rehab PT Goals PT Goal Formulation: With patient Time For Goal Achievement: 12/08/11 Potential to Achieve Goals: Good Pt will go Sit to Stand: with modified independence PT Goal: Sit to Stand - Progress: Goal set today Pt will go Stand to Sit: with modified independence PT Goal: Stand to Sit - Progress: Goal set today Pt will Ambulate: >150 feet;with modified independence;with least restrictive assistive device PT Goal: Ambulate - Progress: Goal set today Pt will Go Up / Down Stairs: 3-5 stairs;with modified independence;with rail(s) PT Goal: Up/Down Stairs - Progress: Goal set today Pt will Perform Home Exercise Program: Independently PT Goal: Perform Home Exercise Program - Progress: Goal set  today  Visit Information  Last PT Received On: 11/24/11 Assistance Needed: +1 PT/OT Co-Evaluation/Treatment: Yes    Subjective Data  Subjective: "I have already been up to the chair."   Prior Functioning  Home Living Lives With: Spouse Type of Home: House Home Access: Stairs to enter [employer] of Steps: 4 Entrance Stairs-Rails: Right;Left Home Layout: Two level;Bed/bath upstairs Alternate Level Stairs-Number of Steps: 14 Bathroom Shower/Tub: Engineer, manufacturing systems: Standard Prior Function Level of Independence: Independent Able to Take Stairs?: Yes Driving: Yes Vocation: Other (comment) (planning to retire soon) Communication Communication: No difficulties    Cognition  Overall Cognitive Status: Appears within functional limits for tasks assessed/performed Arousal/Alertness: Awake/alert Orientation Level: Appears intact for tasks assessed Behavior During Session: [facility] for tasks performed    Extremity/Trunk Assessment Right Upper Extremity Assessment RUE ROM/Strength/Tone: Deficits RUE ROM/Strength/Tone Deficits: 3+/5 shoulder abduction, which pt reports is normal for him. All other  RUE grossly 5/5. Left Upper Extremity Assessment LUE ROM/Strength/Tone: WFL for tasks assessed Right Lower Extremity Assessment RLE ROM/Strength/Tone: [facility] for tasks assessed Left Lower Extremity Assessment LLE ROM/Strength/Tone: WFL for tasks assessed   Balance Balance Balance Assessed: Yes Dynamic Standing Balance Dynamic Standing - Balance Support: No upper extremity supported Dynamic Standing - Level of Assistance: 5: Stand by assistance;4: Min assist Dynamic Standing - Comments: Pt completed Rhomberg test with EO and EC with minimal sway with EC, no LOB. Pt able to pick one foot up off floor but unable to maintain balance and min assist to correct LOB.  End of Session PT - End of Session Equipment Utilized During Treatment: Gait belt Activity Tolerance: Patient  tolerated treatment well Patient left: in chair;with call bell/phone within reach;with family/visitor present Nurse Communication: Mobility status  GP     Eloina Ergle 11/24/2011, 1:42 PM

## 2011-11-24 NOTE — H&P (Signed)
Internal Medicine Attending Admission Note Date: 11/24/2011  Patient name: Dylan Morgan Medical record number: [id number] Date of birth: [birth year] Age: 66 y.o. Gender: male  I saw and evaluated the patient. I reviewed the resident's note and I agree with the resident's findings and plan as documented in the resident's note, with the following additional comments.  Chief Complaint(s): Episode of left upper extremity and left lower extremity weakness  History - key components related to admission: Patient is a 66 year old man with history of CVA, coronary artery disease status post CABG, abdominal aortic aneurysm status post repair, hypertension, paroxysmal atrial fibrillation on chronic warfarin, chronic kidney disease, and other problems as outlined in medical history, admitted with complaint of left arm and leg weakness which improved after arrival in the emergency department.  He denies chest pain, shortness of breath, abdominal pain, or other acute problems.  Physical Exam - key components related to admission:  Filed Vitals:   11/23/11 2149 11/24/11 0300 11/24/11 0621 11/24/11 0917  BP:  103/50 117/72 116/59  Pulse:  50 50 50  Temp:  97.6 F (36.4 C) 98 F (36.7 C) 97.3 F (36.3 C)  TempSrc:  Oral Oral Oral  Resp:  20 20 20   Height: 5\' 11"  (1.803 m)     Weight: 243 lb 9.6 oz (110.496 kg)     SpO2:  98% 95% 97%   General: Alert, no distress Lungs: Clear Back: No CVA tenderness; no midline tenderness; no ecchymoses Abdomen: Bowel sounds present, soft, nontender Extremities: No edema Neurologic: Alert and oriented; no evident facial droop; motor 5 over 5 bilaterally   Lab results:   Basic Metabolic Panel:  Basename 11/24/11 0915 11/23/11 1401 11/23/11 1348  NA 138 138 --  K 4.5 4.8 --  CL 106 106 --  CO2 21 -- 21  GLUCOSE 120* 125* --  BUN 45* 52* --  CREATININE 2.02* 3.70* --  CALCIUM 8.7 -- 10.5  MG -- -- --  PHOS -- -- --   Liver Function Tests:  Basename  11/23/11 1348  AST 36  ALT 23  ALKPHOS 83  BILITOT 0.3  PROT 8.3  ALBUMIN 4.2    CBC:  Basename 11/24/11 0915 11/23/11 1401 11/23/11 1348  WBC 3.2* -- 6.3  NEUTROABS -- -- 4.0  HGB 11.9* 14.6 --  HCT 35.3* 43.0 --  MCV 90.3 -- 89.3  PLT 109* -- 156    Cardiac Enzymes:  Basename 11/24/11 0915 11/24/11 0332 11/23/11 2136  CKTOTAL 273* 371* 413*  CKMB 4.4* 5.3* 6.3*  CKMBINDEX -- -- --  TROPONINI <0.30 <0.30 <0.30    CBG:  Basename 11/24/11 1215 11/24/11 0734 11/23/11 2139  GLUCAP 96 97 117*   Hemoglobin A1C:  Basename 11/23/11 2136  HGBA1C 5.8*   Fasting Lipid Panel:  Basename 11/24/11 0332  CHOL 136  HDL 35*  LDLCALC UNABLE TO CALCULATE IF TRIGLYCERIDE OVER 400 mg/dL  TRIG 409*  CHOLHDL 3.9  LDLDIRECT --    Coagulation:  Basename 11/24/11 0332 11/23/11 1348  INR 4.90* 4.55*   Urinalysis    Component Value Date/Time   COLORURINE YELLOW 11/23/2011 2224   APPEARANCEUR CLEAR 11/23/2011 2224   LABSPEC 1.025 11/23/2011 2224   PHURINE 5.0 11/23/2011 2224   GLUCOSEU NEGATIVE 11/23/2011 2224   HGBUR NEGATIVE 11/23/2011 2224   HGBUR negative 01/22/2009 1355   BILIRUBINUR NEGATIVE 11/23/2011 2224   KETONESUR NEGATIVE 11/23/2011 2224   PROTEINUR NEGATIVE 11/23/2011 2224   UROBILINOGEN 0.2 11/23/2011 2224   NITRITE NEGATIVE  11/23/2011 2224   LEUKOCYTESUR NEGATIVE 11/23/2011 2224     Imaging results:  Ct Head Wo Contrast  11/23/2011  *RADIOLOGY REPORT*  Clinical Data: Code stroke.  Left-sided parasthesias.  CT HEAD WITHOUT CONTRAST  Technique:  Contiguous axial images were obtained from the base of the skull through the vertex without contrast.  Comparison: 08/17/2011  Findings: Despite efforts by the patient and technologist, motion artifact is present on some series of today's examination and could not be totally eliminated.  This reduces diagnostic sensitivity and specificity.  The brain stem, cerebellum, cerebral peduncles, basal ganglia, thalami, lateral  ventricles, and basilar cisterns appear unremarkable.  Periventricular and corona radiata white matter hypodensities are most compatible with chronic ischemic microvascular white matter disease.  Faintly increased periventricular white matter hypodensity on the right, for example on image 19 of series 2, is likely a manifestation of the prior lacunar infarcts in this vicinity shown on MRI from 08/17/2011.  Otherwise no significant change.  No intracranial hemorrhage or mass lesion noted.  Slight asymmetry in density in the middle cerebral arteries is primarily attributed to motion artifact; the density the right MCA appears relatively similar to the prior exam, and the left is somewhat obscured by the motion artifact.  Left scleral band noted.  IMPRESSION:  1.  Right periventricular white matter hypodensities adjacent to the right lateral ventricle likely reflect evolutionary findings from prior lacunar infarcts shown in May 2013.  These are somewhat indistinct against the background of chronic microvascular white matter disease.  No definite new infarct, mass lesion, or intracranial hemorrhage is apparent on today's CT scan.  These results were called by telephone on 11/23/2011 at 2:07 p.m. to Dr. Noel Christmas, who verbally acknowledged these results.   Original Report Authenticated By: Dellia Cloud, M.D.    Dg Chest Port 1 View  11/23/2011  *RADIOLOGY REPORT*  Clinical Data: Code stroke  PORTABLE CHEST - 1 VIEW  Comparison: 09/14/2011  Findings: Mild cardiomegaly.  Clear lungs.  No pneumothorax or pleural effusion. Normal vascularity.  IMPRESSION: Cardiomegaly without edema.   Original Report Authenticated By: Donavan Burnet, M.D.     Other results: EKG: Sinus arrhythmia; left anterior fascicular block; no acute changes   Assessment & Plan by Problem:  1.  TIA versus CVA.  Head CT scan showed no sign of a new stroke; patient's symptoms have resolved.  Plan is MRI/MRA of brain; carotid  Dopplers; neurology consult obtained; continue risk factor management; OT/PT/speech therapy consults.  2.  Acute on chronic renal failure.  Possibly due to volume depletion, NSAID, or ACE inhibitor effect.  Creatinine has improved today.  Plan is hold ACEI; judicious IVF; follow creatinine and urine output.  3. Paroxysmal atrial fibrillation.  Now in NSR; INR over-therapeutic; plan is pharmacy consult for warfarin management, follow INR.  4. Other problems as per resident physician.

## 2011-11-24 NOTE — Progress Notes (Signed)
Pt's HR has been sustaining in the 40s, but asymtomatic, Md on call for internal medicine was notified who said since pt is asymptomatic, to just monitor pt.---Latissa Frick, Eann Cleland, rn

## 2011-11-24 NOTE — Progress Notes (Signed)
RN was notified of abnormal pulse and BP 

## 2011-11-24 NOTE — Progress Notes (Signed)
  Echocardiogram 2D Echocardiogram has been performed.  Cathie Beams 11/24/2011, 12:07 PM

## 2011-11-24 NOTE — Progress Notes (Signed)
Pt arrived from the ED to room 4n19, pt is alert  and oriented, SBP was in the 80's but pt was asymptomatic, Md on call for internal medicine was notified and he said said to just monitor pt. Will continue to monitor pt.----Gaelen Brager, Jemar Paulsen, rn

## 2011-11-24 NOTE — Progress Notes (Signed)
VASCULAR LAB PRELIMINARY  PRELIMINARY  PRELIMINARY  PRELIMINARY  Carotid duplex  completed.    Preliminary report:  Right:  40-59% internal carotid artery stenosis.  Increased velocities may be due to compensatory flow.  Left:  Occluded internal carotid artery.  Right:  Vertebral artery  demonstrates the "bunny sign" waveform suggesting proximal stenosis.  Left: Vertebral artery flow is antegrade.    Zeda Gangwer, RVT 11/24/2011, 11:18 AM

## 2011-11-24 NOTE — Evaluation (Signed)
I have read and agree with the below assessment and plan.   Laqueisha Catalina Helen Whitlow PT, DPT Pager: [phone] 

## 2011-11-25 DIAGNOSIS — I63239 Cerebral infarction due to unspecified occlusion or stenosis of unspecified carotid arteries: Secondary | ICD-10-CM

## 2011-11-25 LAB — CBC
HCT: 37 % — ABNORMAL LOW (ref 39.0–52.0)
Hemoglobin: 12.6 g/dL — ABNORMAL LOW (ref 13.0–17.0)
MCV: 90.2 fL (ref 78.0–100.0)
Platelets: 111 10*3/uL — ABNORMAL LOW (ref 150–400)
RBC: 4.1 MIL/uL — ABNORMAL LOW (ref 4.22–5.81)
WBC: 3.5 10*3/uL — ABNORMAL LOW (ref 4.0–10.5)

## 2011-11-25 LAB — RENAL FUNCTION PANEL
Albumin: 3.4 g/dL — ABNORMAL LOW (ref 3.5–5.2)
BUN: 32 mg/dL — ABNORMAL HIGH (ref 6–23)
CO2: 25 mEq/L (ref 19–32)
Chloride: 106 mEq/L (ref 96–112)
Glucose, Bld: 86 mg/dL (ref 70–99)
Potassium: 5.4 mEq/L — ABNORMAL HIGH (ref 3.5–5.1)

## 2011-11-25 LAB — PROTIME-INR: Prothrombin Time: 35.4 seconds — ABNORMAL HIGH (ref 11.6–15.2)

## 2011-11-25 MED ORDER — WARFARIN SODIUM 2.5 MG PO TABS
2.5000 mg | ORAL_TABLET | Freq: Once | ORAL | Status: AC
  Administered 2011-11-25: 2.5 mg via ORAL
  Filled 2011-11-25: qty 1

## 2011-11-25 MED ORDER — MAGNESIUM SULFATE 40 MG/ML IJ SOLN
2.0000 g | Freq: Once | INTRAMUSCULAR | Status: AC
  Administered 2011-11-25: 2 g via INTRAVENOUS
  Filled 2011-11-25: qty 50

## 2011-11-25 MED ORDER — WARFARIN - PHARMACIST DOSING INPATIENT: Freq: Every day | Status: DC

## 2011-11-25 NOTE — Progress Notes (Signed)
Physical Therapy Treatment Patient Details Name: Dylan Morgan MRN: [id number] DOB: 03-21-1946 Today's Date: 11/25/2011 Time: 7829-5621 PT Time Calculation (min): 10 min  PT Assessment / Plan / Recommendation Comments on Treatment Session  Pt is at his baseline functional mobility, no balance deficits. All goals met and all education completed. No further PT needs.    Follow Up Recommendations  No PT follow up       Equipment Recommendations  None recommended by PT;None recommended by OT;None recommended by SLP          Plan All goals met and education completed, patient dischaged from PT services    Precautions / Restrictions Precautions Precautions: Fall Restrictions Weight Bearing Restrictions: No   Pertinent Vitals/Pain Pt with no pain complaints    Mobility  Bed Mobility Bed Mobility: Not assessed Transfers Transfers: Sit to Stand;Stand to Sit Sit to Stand: 7: Independent Stand to Sit: 7: Independent Ambulation/Gait Ambulation/Gait Assistance: 7: Independent Ambulation Distance (Feet): 300 Feet Assistive device: None Ambulation/Gait Assistance Details: No difficulties or loss of balance throughout unit.  Gait Pattern: Within Functional Limits Stairs: Yes Stairs Assistance: 7: Independent Stair Management Technique: One rail Left;Step to pattern;Forwards Number of Stairs: 3     Exercises     PT Diagnosis:    PT Problem List:   PT Treatment Interventions:     PT Goals Acute Rehab PT Goals PT Goal: Sit to Stand - Progress: Met PT Goal: Stand to Sit - Progress: Met PT Goal: Ambulate - Progress: Met PT Goal: Up/Down Stairs - Progress: Met PT Goal: Perform Home Exercise Program - Progress: Discontinued (comment) (not applicable)  Visit Information  Last PT Received On: 11/25/11 Assistance Needed: +1    Subjective Data      Cognition  Overall Cognitive Status: Appears within functional limits for tasks assessed/performed Arousal/Alertness:  Awake/alert Orientation Level: Appears intact for tasks assessed Behavior During Session: [facility] for tasks performed    Balance     End of Session PT - End of Session Equipment Utilized During Treatment: Gait belt Activity Tolerance: Patient tolerated treatment well Patient left: in chair;with call bell/phone within reach;with family/visitor present Nurse Communication: Mobility status   GP     Milana Kidney 11/25/2011, 5:26 PM  11/25/2011 Milana Kidney DPT PAGER: [phone] OFFICE: [phone]

## 2011-11-25 NOTE — Progress Notes (Signed)
Physical Therapy Discharge Patient Details Name: Dylan Morgan MRN: [id number] DOB: 1946-01-10 Today's Date: 11/25/2011 Time: 4098-1191 PT Time Calculation (min): 10 min  Patient discharged from PT services secondary to goals met and no further PT needs identified.  Please see latest therapy progress note for current level of functioning and progress toward goals.    Progress and discharge plan discussed with patient and/or caregiver: Patient/Caregiver agrees with plan  GP     Milana Kidney 11/25/2011, 5:27 PM

## 2011-11-25 NOTE — Progress Notes (Signed)
Subjective: He is feeling better today with left sided weakness completely gone.   He denies dizziness, headache, chest pain, shortness of breath, increased abdominal pain, melena, or hematochezia.  Objective: Vital signs in last 24 hours: Filed Vitals:   11/24/11 0917 11/24/11 1450 11/24/11 1825 11/24/11 2126  BP: 116/59 124/63 121/100 130/68  Pulse: 50 52 51 54  Temp: 97.3 F (36.3 C) 98.1 F (36.7 C) 97.4 F (36.3 C) 97.6 F (36.4 C)  TempSrc: Oral Oral Oral Oral  Resp: 20 20 18 20   Height:      Weight:      SpO2: 97% 99% 98% 100%   Weight change:   Intake/Output Summary (Last 24 hours) at 11/25/11 0018 Last data filed at 11/24/11 2000  Gross per 24 hour  Intake   2120 ml  Output   1180 ml  Net    940 ml   Vitals reviewed. General: Sitting in chair, in NAD HEENT: no scleral icterus Cardiac: RRR, no rubs, murmurs or gallops Pulm: clear to auscultation bilaterally, no wheezes, rales, or rhonchi Abd: soft, nontender, nondistended, BS present Ext: warm and well perfused, no pedal edema GU: No CVA tenderness MSK: paraspinal tenderness, no ecchymoses Neuro: alert and oriented X3, cranial nerves II-XII grossly intact, strength 5/5 in UE and LE, sensation to light touch equal in bilateral upper and lower extremities  Lab Results: Basic Metabolic Panel:  Lab 11/24/11 4540 11/23/11 1401 11/23/11 1348  NA 138 138 --  K 4.5 4.8 --  CL 106 106 --  CO2 21 -- 21  GLUCOSE 120* 125* --  BUN 45* 52* --  CREATININE 2.02* 3.70* --  CALCIUM 8.7 -- 10.5  MG -- -- --  PHOS -- -- --   Liver Function Tests:  Lab 11/23/11 1348  AST 36  ALT 23  ALKPHOS 83  BILITOT 0.3  PROT 8.3  ALBUMIN 4.2   CBC:  Lab 11/24/11 0915 11/23/11 1401 11/23/11 1348  WBC 3.2* -- 6.3  NEUTROABS -- -- 4.0  HGB 11.9* 14.6 --  HCT 35.3* 43.0 --  MCV 90.3 -- 89.3  PLT 109* -- 156   Cardiac Enzymes:  Lab 11/24/11 0915 11/24/11 0332 11/23/11 2136  CKTOTAL 273* 371* 413*  CKMB 4.4* 5.3* 6.3*   CKMBINDEX -- -- --  TROPONINI <0.30 <0.30 <0.30   CBG:  Lab 11/24/11 1650 11/24/11 1215 11/24/11 0734 11/23/11 2139  GLUCAP 95 96 97 117*   Hemoglobin A1C:  Lab 11/23/11 2136  HGBA1C 5.8*   Fasting Lipid Panel:  Lab 11/24/11 0915 11/24/11 0332  CHOL -- 136  HDL -- 35*  LDLCALC -- UNABLE TO CALCULATE IF TRIGLYCERIDE OVER 400 mg/dL  TRIG -- 981*  CHOLHDL -- 3.9  LDLDIRECT 46 --   Coagulation:  Lab 11/24/11 0332 11/23/11 1348  LABPROT 46.4* 43.8*  INR 4.90* 4.55*   Urine Drug Screen: Drugs of Abuse     Component Value Date/Time   LABOPIA NONE DETECTED 11/23/2011 2129   COCAINSCRNUR NONE DETECTED 11/23/2011 2129   LABBENZ NONE DETECTED 11/23/2011 2129   AMPHETMU NONE DETECTED 11/23/2011 2129   THCU NONE DETECTED 11/23/2011 2129   LABBARB NONE DETECTED 11/23/2011 2129    Urinalysis:  Lab 11/23/11 2224  COLORURINE YELLOW  LABSPEC 1.025  PHURINE 5.0  GLUCOSEU NEGATIVE  HGBUR NEGATIVE  BILIRUBINUR NEGATIVE  KETONESUR NEGATIVE  PROTEINUR NEGATIVE  UROBILINOGEN 0.2  NITRITE NEGATIVE  LEUKOCYTESUR NEGATIVE   Misc. Labs: INR: 4.9  Micro Results: Recent Results (from the past  240 hour(s))  MRSA PCR SCREENING     Status: Normal   Collection Time   11/24/11 12:27 AM      Component Value Range Status Comment   MRSA by PCR NEGATIVE  NEGATIVE Final    Studies/Results: Ct Head Wo Contrast  11/23/2011  *RADIOLOGY REPORT*  Clinical Data: Code stroke.  Left-sided parasthesias.  CT HEAD WITHOUT CONTRAST  Technique:  Contiguous axial images were obtained from the base of the skull through the vertex without contrast.  Comparison: 08/17/2011  Findings: Despite efforts by the patient and technologist, motion artifact is present on some series of today's examination and could not be totally eliminated.  This reduces diagnostic sensitivity and specificity.  The brain stem, cerebellum, cerebral peduncles, basal ganglia, thalami, lateral ventricles, and basilar cisterns appear  unremarkable.  Periventricular and corona radiata white matter hypodensities are most compatible with chronic ischemic microvascular white matter disease.  Faintly increased periventricular white matter hypodensity on the right, for example on image 19 of series 2, is likely a manifestation of the prior lacunar infarcts in this vicinity shown on MRI from 08/17/2011.  Otherwise no significant change.  No intracranial hemorrhage or mass lesion noted.  Slight asymmetry in density in the middle cerebral arteries is primarily attributed to motion artifact; the density the right MCA appears relatively similar to the prior exam, and the left is somewhat obscured by the motion artifact.  Left scleral band noted.  IMPRESSION:  1.  Right periventricular white matter hypodensities adjacent to the right lateral ventricle likely reflect evolutionary findings from prior lacunar infarcts shown in May 2013.  These are somewhat indistinct against the background of chronic microvascular white matter disease.  No definite new infarct, mass lesion, or intracranial hemorrhage is apparent on today's CT scan.  These results were called by telephone on 11/23/2011 at 2:07 p.m. to Dr. Noel Christmas, who verbally acknowledged these results.   Original Report Authenticated By: Dellia Cloud, M.D.    Mri Brain Without Contrast  11/24/2011  *RADIOLOGY REPORT*  Clinical Data:  Left-sided paresthesias.  MRI BRAIN WITHOUT CONTRAST MRA HEAD WITHOUT CONTRAST  Technique: Multiplanar, multiecho pulse sequences of the brain and surrounding structures were obtained according to standard protocol without intravenous contrast.  Angiographic images of the head were obtained using MRA technique without contrast.  Comparison: 11/23/2011 CT.  08/17/2011 MR.  MRI HEAD  Findings:  Motion degraded exam.  No acute infarct.  Moderate small vessel disease type changes. Remote small infarct posterior right corona radiata.  Global atrophy without  hydrocephalus.  No intracranial mass lesion detected on this unenhanced exam.  Occluded left internal carotid artery.  Please see below.  No intracranial hemorrhage.  Exophthalmos.  Prior banding left globe.  Mild spinal stenosis C3-4.  Minimal paranasal sinus mucosal thickening.  IMPRESSION: No acute infarct.  Please see above.  MRA HEAD  Findings: Occluded left internal carotid artery.  Reconstitution of flow supraclinoid region.  Ectatic right internal carotid artery.  Mild irregularity of portions of the middle cerebral artery and anterior cerebral artery bilaterally but without high-grade proximal stenosis.  Irregular narrowed vertebral arteries and basilar artery with narrowing most notable distal right vertebral artery.  The full extent of the PICAs are not included present exam. Moderate to marked narrowing right PICA or poor delineation left PICA.  Poor delineation of the AICAs.  Small bulge at the expected origin of the right AICA.  Marked atherosclerotic type changes of the superior cerebral arteries and posterior cerebral arteries.  IMPRESSION: Occluded left internal carotid artery with significant intracranial atherosclerotic type changes once again noted.   Original Report Authenticated By: Fuller Canada, M.D.    Dg Chest Port 1 View  11/23/2011  *RADIOLOGY REPORT*  Clinical Data: Code stroke  PORTABLE CHEST - 1 VIEW  Comparison: 09/14/2011  Findings: Mild cardiomegaly.  Clear lungs.  No pneumothorax or pleural effusion. Normal vascularity.  IMPRESSION: Cardiomegaly without edema.   Original Report Authenticated By: Donavan Burnet, M.D.    Mr Mra Head/brain Wo Cm  11/24/2011  *RADIOLOGY REPORT*  Clinical Data:  Left-sided paresthesias.  MRI BRAIN WITHOUT CONTRAST MRA HEAD WITHOUT CONTRAST  Technique: Multiplanar, multiecho pulse sequences of the brain and surrounding structures were obtained according to standard protocol without intravenous contrast.  Angiographic images of the head were  obtained using MRA technique without contrast.  Comparison: 11/23/2011 CT.  08/17/2011 MR.  MRI HEAD  Findings:  Motion degraded exam.  No acute infarct.  Moderate small vessel disease type changes. Remote small infarct posterior right corona radiata.  Global atrophy without hydrocephalus.  No intracranial mass lesion detected on this unenhanced exam.  Occluded left internal carotid artery.  Please see below.  No intracranial hemorrhage.  Exophthalmos.  Prior banding left globe.  Mild spinal stenosis C3-4.  Minimal paranasal sinus mucosal thickening.  IMPRESSION: No acute infarct.  Please see above.  MRA HEAD  Findings: Occluded left internal carotid artery.  Reconstitution of flow supraclinoid region.  Ectatic right internal carotid artery.  Mild irregularity of portions of the middle cerebral artery and anterior cerebral artery bilaterally but without high-grade proximal stenosis.  Irregular narrowed vertebral arteries and basilar artery with narrowing most notable distal right vertebral artery.  The full extent of the PICAs are not included present exam. Moderate to marked narrowing right PICA or poor delineation left PICA.  Poor delineation of the AICAs.  Small bulge at the expected origin of the right AICA.  Marked atherosclerotic type changes of the superior cerebral arteries and posterior cerebral arteries.  IMPRESSION: Occluded left internal carotid artery with significant intracranial atherosclerotic type changes once again noted.   Original Report Authenticated By: Fuller Canada, M.D.    Medications: I have reviewed the patient's current medications. Scheduled Meds:   . simvastatin  10 mg Oral QHS   Continuous Infusions:   . sodium chloride 200 mL/hr at 11/24/11 0643   Followed by  . sodium chloride 125 mL/hr at 11/24/11 0743   PRN Meds:. Assessment/Plan: Mr. Matera is a 66 year-old man with PMH significant for previous CVA in 9/12, TIA in 5/13, paroxysmal Afib on anticoagulation, CAD, HTN,  hyperlipidemia, left internal carotid occlusion and moderate stenosis of the right internal carotid who presented with Left side weakness starting at around 10AM on admission day.   CVA v TIA. His initial NIHSS was 4. His symptoms started at 10 AM on the day of admission and he presented to the ED >4 hours after initiation of his symptoms and he was on coumadin. He was, therefore, not a candidate for tPA. His INR was supratherapeutic on the day of admission and he reported a fall on the prior to his presentation, raising concern for a hemorraghic stroke. However, his CT head without contrast showed no acute intracranial abnormality such as acute or subacute bleeding. He has had CVAs and at least one TIA in the past, as recent as May 2013 involving his right subcortical middle cerebral artery. Neurology saw him while at the ED and  suspect probable recurrent TIA involving the right subcortical MCA though recurrent subcortical right MCA cannot be ruled out. His management and treatment will be focused on risk stratification and modification. His identified stroke risk factors are Afib, carotid artery stenosis, hyperlipidemia, and hypertension.  --2D echo: EF 65-70%, moderate LV, no wall motion or valve abnormality,  --Carotid artery duplex revealed 40-59% internal carotid artery stenosis with increased velocity/compensatory flow, and left ICA occlusion with suggested vertebral artery proximal stenosis.  --MRI/MRA showed no acute infarct but occluded left internal carotid artery with significant intracranial atherosclerotic type changes.  --PT recommend outpatient PT and supervision for mobility, fall precautions.  --OT with recommendations for fall precautions --SLP had no follow up recommendations --Direct LDL 46 --HbA1C 5.8 --UDS negative --CE with negative troponin x3, elevated CK and CKMB in context of ?AKI on CKD II, RI 1.6   Plan: - Continue telemetry  - Neuro checks with vital signs  - Continue  simvastatin 10mg   - Prophylactic therapy-Anticoagulation: Coumadin-Pharmacy to manage. Holding coumadin given supra therapeutic INR.    Paroxysmal A fib. Pt is followed by Dr. Myrtis Ser at [facility], was recently diagnosed with paroxymal Afib and was started on coumadin. He is followed at the [facility] clinic for his therapy. At presentation he was not in Afib, however his INR was supratherapeutic at 4.55. He reported decreased PO intake two days prior to his admission and his renal function may have declined from baseline, with potential reduced excretion of warfarin. He denies bleeding but has noticed easy bruising.  -Warfarin per pharmacy, on hold for INR 4.9 -Will consider vitamin K if signs of bleeding or FFP + vitamin K+ PRBC if major bleeding.   CAD/carotid artery disease. He has known complete occlusion of the left internal carotid and moderate stenosis of right internal carotid. He is s/p CABG in 2006, although we cannot find records at this time. He is followed by Dr. Myrtis Ser in [facility] cardiology. His last echo was in 2006. Bilateral carotid artery doppler confirms left ICA occlusion, moderate right ICA occlusion. Direct LDL at 46.  - Will consider vascular surgery consult for possible carotid artery endarterectomy or stenting.    Recent fall. He reports falling from a ladder (~ 4 feet heigth) one day prior to his admission while working/helping a friend. He continues to deny severe pain, he feels sore with no bruises or hematuria. He has paraspinal musculoskeletal pain today but his total Ck and CKMB are elevated. This could be a component of MSK injury and decreased PO, possibly pre-renal azotemia. Given his supratherapeutic INR and AAA with stent we will continue to monitor H/H and bleeding. UA negative for Hgb.  -Will continue to monitor for worsening back pain.  -AM CBC   AAA. Pt with hx of abdominal aortic aneurysm with stent in 2010. Pt with no abdominal pain. Will consider  abdominal US if worsening back pain, abdominal pain, decreased pedal pulses, hypotension, or drop in H/H.   Acute on chronic renal failure (CKD stage II). His baseline creatinine appears to be ~3.6 although it has ranged from 1.7 to 4.19 in the last 2 months. He reports decreased PO intake and increased sweating on the day prior to admission, while taking lisinopril. He appeared euvolemic on initial physical exam, however, with pretibial edema but his urine osmolality and Na, with FeNa of 0.39% suggested pre-renal azothemia. He received IVF with NS of 1.6 L yesterday with IVF of NS@125ml /hr today. Urine output has been adequate at 0.27ml/kg/hr today.  -  AM renal function panel, will trend Cr   HTN. BP initially low at 101/57 while in ED. Pt reported decreased PO intake and increased sweating 2/2 working outdoors and continued compliance with lisinopril. His BP increased to 138/69 with no fluid boluses. Given his recent TIA/CVA, mild hypertension will be permitted. BP however, has been low today with asymptomatic bradycardia.  - Will consider increasing IVF fluids in AM but monitor BP closely, especially with hx of AAA (although s/p stent) -Hold BP meds while pt hypotensive.  -Will monitor and resume lisinopril 10mg  if hypertensive.   GERD. Pt with history of GERD but no complaints on admission and not on medications at home for this problem. Will continue to monitor.   DVT prophylaxis: SCDs      LOS: 2 days   Ky Barban 11/24/2011, 11:00 PM

## 2011-11-25 NOTE — Progress Notes (Signed)
Dylan Kenner MD notified pt is sinus brady. Heart rate 38.

## 2011-11-25 NOTE — Consult Note (Signed)
[facility] [place]   Vascular Consult Note    Patient name: Dylan Morgan MRN: [id number] DOB: [birth year] Sex: male   Referred by: Meredith Pel  Reason for referral: Right brain TIA with right carotid stenosis  HISTORY OF PRESENT ILLNESS: Patient is a 66 year old gentleman with a complex past medical history. He he has had several episodes of left side weakness dating back at least October 2012. He was admitted in May of 2013 with a right brain stroke by MRI. He had resolution and returned to his normal activities. Yesterday he had a new event of left-sided arm and leg weakness. His chart says that there was some speech difficulty but the patient denies this. He is now back at his baseline. He does have extensive past history with coronary bypass grafting. He does have a history of stent graft repair of abdominal aortic aneurysm by Dr. Hart Rochester in 2010.  Past Medical History  Diagnosis Date  . CAD (coronary artery disease) CABG in 2004    F/U by Dr Genia Harold cardiology, cardiolite scan done elsewhere in October 2010 that was normal, no ischemia seen, EF 55-65% in 02/2005  . GERD (gastroesophageal reflux disease)   . Hypertension   . Hyperlipidemia   . Renal insufficiency   . Cellulitis and abscess of leg     right leg  . Tobacco abuse   . Chronic back pain   . Chronic neck pain   . Seborrheic dermatitis of scalp   . Abnormal LFTs     Hx of abnormal LFT's  . Bradycardia   . AAA (abdominal aortic aneurysm)     November 2010, Stenting, November, 2010  . Paroxysmal atrial fibrillation 02/22/2009    diltiazem po started but discontinued due to side effects.  . Stroke   . Ejection fraction     EF 40% in the past  /   EF 55-65% echo, November, 2006  . Hx of CABG     2004  . Carotid artery disease     Doppler, October, 2012, and old LICA occlusion , RICA no significant abnormality  . Arthritis   . Prediabetes   . CVA (cerebral infarction)     R  Carona radiata stroke 08/16/2011    Past Surgical History  Procedure Date  . Coronary artery bypass graft 2004  . Rotator cuff repair     History   Social History  . Marital Status: Single    Spouse Name: N/A    Number of Children: N/A  . Years of Education: N/A   Occupational History  . disabled    Social History Main Topics  . Smoking status: Former Smoker    Types: Cigarettes    Quit date: 02/03/2010  . Smokeless tobacco: Never Used  . Alcohol Use: No  . Drug Use: No  . Sexually Active: No   Other Topics Concern  . Not on file   Social History Narrative   Lives with son and his significant other, denies being married. Has 3 daughters & a son.  Several grandchildren. Divorced.Regular exercise.Prior to retirement, hung dry wall for a living.     Family History  Problem Relation Age of Onset  . Diabetes Mother   . Stroke Father   . Cancer Sister     thyroid cancer  . Heart disease Brother     Coronary artery disease    Allergies  Allergen Reactions  . Diltiazem Hcl Other (See  Comments)    unknown  . Oxycodone-Acetaminophen Other (See Comments)    unknown    Prior to Admission medications   Medication Sig Start Date End Date Taking? Authorizing Provider  ibuprofen (ADVIL,MOTRIN) 200 MG tablet Take 200 mg by mouth every 6 (six) hours as needed. For pain   Yes Historical Provider, MD  lisinopril (PRINIVIL,ZESTRIL) 10 MG tablet Take 1 tablet (10 mg total) by mouth daily. 09/05/11  Yes Margorie John, MD  Multiple Vitamins-Minerals (MULTIVITAMIN WITH MINERALS) tablet Take 1 tablet by mouth daily.   Yes Historical Provider, MD  sildenafil (VIAGRA) 100 MG tablet Take 100 mg by mouth daily as needed. For erectile dysfunction    Yes Margorie John, MD  simvastatin (ZOCOR) 10 MG tablet Take 10 mg by mouth at bedtime.   Yes Margorie John, MD  warfarin (COUMADIN) 5 MG tablet Take 1 tablet (5 mg total) by mouth daily. 90 day supply 10/02/11 10/01/12 Yes Luis Abed, MD      REVIEW OF SYSTEMS: Reviewed in his history and physical with nothing at  PHYSICAL EXAMINATION:  Filed Vitals:   11/25/11 0600  BP: 139/69  Pulse: 68  Temp: 97.3 F (36.3 C)  Resp: 20    General: The patient appears their stated age. Pulmonary: There is a good air exchange bilaterally without wheezing or rales. Abdomen: Soft and non-tender with normal pitch bowel sounds. Obese. No aneurysm palpated Musculoskeletal: There are no major deformities.  There is no significant extremity pain. Neurologic: No focal weakness or paresthesias are detected, Skin: There are no ulcer or rashes noted. Psychiatric: The patient has normal affect. Cardiovascular: There is a regular rate and rhythm without significant murmur appreciated. Carotid arteries without bruits bilaterally 2+ radial pulses bilaterally, 2+ posterior tibial pulses bilaterally Diagnostic Studies: I reviewed his carotid duplex from May 2013 and from yesterday. This does show left internal carotid artery occlusion and moderate 40-59% right carotid stenosis. Also reviewed his images from his CT angiogram of his carotids from October of 2012. This confirms left internal carotid artery occlusion and moderate right carotid stenosis. Interpretation was a 45%. He does have some tortuosity and in significant kinking as was noted in the report. There is mixed irregular plaque in the bifurcation.    Medication Changes: None  Assessment:  Difficult problem with recurrent right brain symptoms with no clear etiology. I discussed this at length with the patient and his family present. Explained that typically carotid stenosis at this level does not cause symptoms. It is concerning that he continues to have these deficits despite maximal medical treatment. I have discussed this with the internal medicine teaching service team and had suggested formal neurologic evaluation. If no other causes for these recurrent events is found, I would  consider right carotid endarterectomy to rule this out as a cause. I discussed the procedure with the patient including 1-2% risk of stroke with surgery at time of endarterectomy. I would not repea and may. Also he does have elevated creatinine which would put him at significant risk with contrast. We will follow along.t a CT angiogram since the duplex does not show any progression from the study  Plan: Neurology consult  Tiney Zipper 8/24/201310:03 AM

## 2011-11-25 NOTE — Progress Notes (Signed)
ANTICOAGULATION CONSULT NOTE - Follow Up Consult  Pharmacy Consult for Coumadin Indication: atrial fibrillation  Allergies  Allergen Reactions  . Diltiazem Hcl Other (See Comments)    unknown  . Oxycodone-Acetaminophen Other (See Comments)    unknown    Patient Measurements: Height: 5\' 11"  (180.3 cm) Weight: 243 lb 9.6 oz (110.496 kg) IBW/kg (Calculated) : 75.3   Vital Signs: Temp: 97.5 F (36.4 C) (08/24 1123) Temp src: Oral (08/24 1123) BP: 139/70 mmHg (08/24 1123) Pulse Rate: 62  (08/24 1123)  Labs:  Basename 11/25/11 1014 11/25/11 1013 11/24/11 0915 11/24/11 0332 11/23/11 2136 11/23/11 1401 11/23/11 1348  HGB -- 12.6* 11.9* -- -- -- --  HCT -- 37.0* 35.3* -- -- 43.0 --  PLT -- 111* 109* -- -- -- 156  APTT -- -- -- -- -- -- 55*  LABPROT -- 35.4* -- 46.4* -- -- 43.8*  INR -- 3.47* -- 4.90* -- -- 4.55*  HEPARINUNFRC -- -- -- -- -- -- --  CREATININE 1.40* -- 2.02* -- -- 3.70* --  CKTOTAL -- -- 273* 371* 413* -- --  CKMB -- -- 4.4* 5.3* 6.3* -- --  TROPONINI -- -- <0.30 <0.30 <0.30 -- --    Estimated Creatinine Clearance: 63.9 ml/min (by C-G formula based on Cr of 1.4).   Medications:  Scheduled:    . simvastatin  10 mg Oral QHS    Assessment: Atrial Fibrillation:  INR remains supratherapeutic but has declined significantly from yesterday.  Will restart Coumadin at a lower dose today.  Goal of Therapy:  INR 2-3   Plan:  Coumadin 2.5mg  x 1 today Daily PT/INR monitoring  Estella Husk, Pharm.D., BCPS Clinical Pharmacist  Phone [phone] Pager [phone] 11/25/2011, 1:56 PM

## 2011-11-25 NOTE — Progress Notes (Signed)
Subjective: States he is doing great today.  Slept well and has not had any return of the left sided weakness.  He is anxious to go home today.  He had an episode of bradycardia to the high 30s last night while sleeping.  Blood pressure was stable at the time and he denied chest pain, shortness of breath, nausea, vomiting, or palpitations during the event.  He states that he woke up to them checking his blood pressure, sat up on the side of the bed without symptoms and then went back to sleep.    Objective: Vital signs in last 24 hours: Filed Vitals:   11/24/11 2126 11/25/11 0200 11/25/11 0600 11/25/11 1123  BP:  89/58 139/69 139/70  Pulse:  126 68 62  Temp:  97.8 F (36.6 C) 97.3 F (36.3 C) 97.5 F (36.4 C)  TempSrc: Oral   Oral  Resp:  20 20 18   Height:      Weight:      SpO2:  100% 100% 99%   Weight change:   Intake/Output Summary (Last 24 hours) at 11/25/11 1423 Last data filed at 11/24/11 2000  Gross per 24 hour  Intake      0 ml  Output    500 ml  Net   -500 ml   Vitals reviewed. General: resting in bed, NAD HEENT: PERRL, EOMI, no scleral icterus Cardiac: RRR, no rubs, murmurs or gallops Pulm: clear to auscultation bilaterally, no wheezes, rales, or rhonchi Abd: soft, nontender, nondistended, BS present Ext: warm and well perfused, no pedal edema, mild paraspinal tenderness to palpation, no ecchymoses present Neuro: alert and oriented X3, cranial nerves II-XII grossly intact, strength in the upper and lower extremities is 5/5 and symmetric.  Sensation to light touch equal in bilateral upper and lower extremities  Lab Results: Basic Metabolic Panel:  Lab 11/25/11 6213 11/24/11 0915  NA 139 138  K 5.4* 4.5  CL 106 106  CO2 25 21  GLUCOSE 86 120*  BUN 32* 45*  CREATININE 1.40* 2.02*  CALCIUM 8.9 8.7  MG -- --  PHOS 2.0* --   Liver Function Tests:  Lab 11/25/11 1014 11/23/11 1348  AST -- 36  ALT -- 23  ALKPHOS -- 83  BILITOT -- 0.3  PROT -- 8.3  ALBUMIN  3.4* 4.2   CBC:  Lab 11/25/11 1013 11/24/11 0915 11/23/11 1348  WBC 3.5* 3.2* --  NEUTROABS -- -- 4.0  HGB 12.6* 11.9* --  HCT 37.0* 35.3* --  MCV 90.2 90.3 --  PLT 111* 109* --   Cardiac Enzymes:  Lab 11/24/11 0915 11/24/11 0332 11/23/11 2136  CKTOTAL 273* 371* 413*  CKMB 4.4* 5.3* 6.3*  CKMBINDEX -- -- --  TROPONINI <0.30 <0.30 <0.30   CBG:  Lab 11/24/11 1650 11/24/11 1215 11/24/11 0734 11/23/11 2139  GLUCAP 95 96 97 117*   Hemoglobin A1C:  Lab 11/23/11 2136  HGBA1C 5.8*   Fasting Lipid Panel:  Lab 11/24/11 0915 11/24/11 0332  CHOL -- 136  HDL -- 35*  LDLCALC -- UNABLE TO CALCULATE IF TRIGLYCERIDE OVER 400 mg/dL  TRIG -- 086*  CHOLHDL -- 3.9  LDLDIRECT 46 --   Coagulation:  Lab 11/25/11 1013 11/24/11 0332 11/23/11 1348  LABPROT 35.4* 46.4* 43.8*  INR 3.47* 4.90* 4.55*   Drugs of Abuse     Component Value Date/Time   LABOPIA NONE DETECTED 11/23/2011 2129   COCAINSCRNUR NONE DETECTED 11/23/2011 2129   LABBENZ NONE DETECTED 11/23/2011 2129   AMPHETMU NONE  DETECTED 11/23/2011 2129   THCU NONE DETECTED 11/23/2011 2129   LABBARB NONE DETECTED 11/23/2011 2129    Urinalysis:  Lab 11/23/11 2224  COLORURINE YELLOW  LABSPEC 1.025  PHURINE 5.0  GLUCOSEU NEGATIVE  HGBUR NEGATIVE  BILIRUBINUR NEGATIVE  KETONESUR NEGATIVE  PROTEINUR NEGATIVE  UROBILINOGEN 0.2  NITRITE NEGATIVE  LEUKOCYTESUR NEGATIVE   Micro Results: Recent Results (from the past 240 hour(s))  MRSA PCR SCREENING     Status: Normal   Collection Time   11/24/11 12:27 AM      Component Value Range Status Comment   MRSA by PCR NEGATIVE  NEGATIVE Final    Studies/Results: Mri Brain Without Contrast  11/24/2011  *RADIOLOGY REPORT*  Clinical Data:  Left-sided paresthesias.  MRI BRAIN WITHOUT CONTRAST MRA HEAD WITHOUT CONTRAST  Technique: Multiplanar, multiecho pulse sequences of the brain and surrounding structures were obtained according to standard protocol without intravenous contrast.   Angiographic images of the head were obtained using MRA technique without contrast.  Comparison: 11/23/2011 CT.  08/17/2011 MR.  MRI HEAD  Findings:  Motion degraded exam.  No acute infarct.  Moderate small vessel disease type changes. Remote small infarct posterior right corona radiata.  Global atrophy without hydrocephalus.  No intracranial mass lesion detected on this unenhanced exam.  Occluded left internal carotid artery.  Please see below.  No intracranial hemorrhage.  Exophthalmos.  Prior banding left globe.  Mild spinal stenosis C3-4.  Minimal paranasal sinus mucosal thickening.  IMPRESSION: No acute infarct.  Please see above.  MRA HEAD  Findings: Occluded left internal carotid artery.  Reconstitution of flow supraclinoid region.  Ectatic right internal carotid artery.  Mild irregularity of portions of the middle cerebral artery and anterior cerebral artery bilaterally but without high-grade proximal stenosis.  Irregular narrowed vertebral arteries and basilar artery with narrowing most notable distal right vertebral artery.  The full extent of the PICAs are not included present exam. Moderate to marked narrowing right PICA or poor delineation left PICA.  Poor delineation of the AICAs.  Small bulge at the expected origin of the right AICA.  Marked atherosclerotic type changes of the superior cerebral arteries and posterior cerebral arteries.  IMPRESSION: Occluded left internal carotid artery with significant intracranial atherosclerotic type changes once again noted.   Original Report Authenticated By: Fuller Canada, M.D.    Dg Chest Port 1 View  11/23/2011  *RADIOLOGY REPORT*  Clinical Data: Code stroke  PORTABLE CHEST - 1 VIEW  Comparison: 09/14/2011  Findings: Mild cardiomegaly.  Clear lungs.  No pneumothorax or pleural effusion. Normal vascularity.  IMPRESSION: Cardiomegaly without edema.   Original Report Authenticated By: Donavan Burnet, M.D.    Mr Mra Head/brain Wo Cm  11/24/2011  *RADIOLOGY  REPORT*  Clinical Data:  Left-sided paresthesias.  MRI BRAIN WITHOUT CONTRAST MRA HEAD WITHOUT CONTRAST  Technique: Multiplanar, multiecho pulse sequences of the brain and surrounding structures were obtained according to standard protocol without intravenous contrast.  Angiographic images of the head were obtained using MRA technique without contrast.  Comparison: 11/23/2011 CT.  08/17/2011 MR.  MRI HEAD  Findings:  Motion degraded exam.  No acute infarct.  Moderate small vessel disease type changes. Remote small infarct posterior right corona radiata.  Global atrophy without hydrocephalus.  No intracranial mass lesion detected on this unenhanced exam.  Occluded left internal carotid artery.  Please see below.  No intracranial hemorrhage.  Exophthalmos.  Prior banding left globe.  Mild spinal stenosis C3-4.  Minimal paranasal sinus mucosal thickening.  IMPRESSION: No acute infarct.  Please see above.  MRA HEAD  Findings: Occluded left internal carotid artery.  Reconstitution of flow supraclinoid region.  Ectatic right internal carotid artery.  Mild irregularity of portions of the middle cerebral artery and anterior cerebral artery bilaterally but without high-grade proximal stenosis.  Irregular narrowed vertebral arteries and basilar artery with narrowing most notable distal right vertebral artery.  The full extent of the PICAs are not included present exam. Moderate to marked narrowing right PICA or poor delineation left PICA.  Poor delineation of the AICAs.  Small bulge at the expected origin of the right AICA.  Marked atherosclerotic type changes of the superior cerebral arteries and posterior cerebral arteries.  IMPRESSION: Occluded left internal carotid artery with significant intracranial atherosclerotic type changes once again noted.   Original Report Authenticated By: Fuller Canada, M.D.    Medications: I have reviewed the patient's current medications. Scheduled Meds:   . simvastatin  10 mg Oral QHS   . warfarin  2.5 mg Oral ONCE-1800  . Warfarin - Pharmacist Dosing Inpatient   Does not apply q1800   Continuous Infusions:   . sodium chloride 150 mL/hr at 11/25/11 0355   PRN Meds:.  Assessment/Plan: Mr. Orth is a 66 year-old man with PMH significant for previous CVA in 9/12, TIA in 5/13, paroxysmal Afib on anticoagulation, CAD, HTN, hyperlipidemia, left internal carotid occlusion and moderate stenosis of the right internal carotid who presented with Left side weakness starting at around 10AM on admission day.   1. TIA:  His initial NIHSS was 4. His symptoms started at 10 AM on the day of admission and he presented to the ED >4 hours after initiation of his symptoms and he was on coumadin. He was, therefore, not a candidate for tPA. His INR was supratherapeutic on the day of admission and he reported a fall on the prior to his presentation, raising concern for a hemorraghic stroke. However, his CT head without contrast showed no acute intracranial abnormality such as acute or subacute bleeding. He has had CVAs and at least one TIA in the past, as recent as May 2013 involving his right subcortical middle cerebral artery. Neurology saw him while at the ED and suspect probable recurrent TIA involving the right subcortical MCA though recurrent subcortical right MCA cannot be ruled out. His management and treatment will be focused on risk stratification and modification. His identified stroke risk factors are Afib, carotid artery stenosis, hyperlipidemia, and hypertension.  By the day after admission his symptoms had resolved with no residual left sided weakness or slurred speech.  2D echo: EF 65-70%, moderate LV, no wall motion or valve abnormality.  Repeat Carotid artery duplex revealed 40-59% internal carotid artery stenosis with increased velocity/compensatory flow, and left ICA occlusion with suggested vertebral artery proximal stenosis.  MRI/MRA showed no acute infarct but occluded left internal  carotid artery with significant intracranial atherosclerotic type changes. PT and OT have seen him and recommend outpatient follow up.  SLP had no recommendations.  His LDL was 46 which is well controlled and his A1C was 5.8 which is also excellent.  His blood pressure has been well controlled throughout admission with SBP ranging between 90s and 130s.  With his recurrent TIA symptoms and his known carotid artery disease Vascular surgery was consulted to consider CEA.  They state that if his stroke is thought to be secondary to his known carotid disease.  They would like Neurology to comment on if they think this  could be secondary to his disease and if they believe it might be contributing VVS would suggest CEA.    - Continue telemetry   - Continue simvastatin 10mg    - Prophylactic therapy-Anticoagulation: Coumadin-Pharmacy to manage. INR still    supratherapeutic but coming down.   - Appreciate VVS and neurologies input  - Will await Stroke team seeing him and commenting on the possible involvement of his   carotid disease.   2. Paroxysmal A fib: Pt is followed by Dr. Myrtis Ser at [facility], was recently diagnosed with paroxymal Afib and was started on coumadin. He is followed at the [facility] for his therapy. At presentation he was not in Afib, however his INR was supratherapeutic at 4.55. He reported decreased PO intake two days prior to his admission and his renal function may have declined from baseline, with potential reduced excretion of warfarin. He denies bleeding but has noticed easy bruising.  He has had several episodes of bradycardia without any nodal blocking agents on board during this admission.  They have all happened while he is asleep and asymptomatic.  He will need close follow up with Dr. Myrtis Ser and may need consideration for a demand pacemaker.   -Warfarin per pharmacy,   3. CAD/carotid artery disease. He has known complete occlusion of the left internal carotid and  moderate stenosis of right internal carotid. He is s/p CABG in 2006, although we cannot find records at this time. He is followed by Dr. Myrtis Ser in [facility] cardi[facility]. His last echo was in 2006. Bilateral carotid artery doppler confirms left ICA occlusion, moderate right ICA occlusion. Direct LDL at 46. Awaiting Neurology's opinion on if his carotid disease is contributing to his TIA symptoms.   4. Recent fall. He reports falling from a ladder (~ 4 feet heigth) one day prior to his admission while working/helping a friend. He continues to deny severe pain, he feels sore with no bruises or hematuria. He has paraspinal musculoskeletal pain today but his total Ck and CKMB are elevated. This could be a component of MSK injury and decreased PO, possibly pre-renal azotemia. Given his supratherapeutic INR and AAA with stent we will continue to monitor H/H and bleeding. UA negative for Hgb. Hemoglobin has been stable.    5. AAA. Pt with hx of abdominal aortic aneurysm with stent in 2010. Pt with no abdominal pain. Will consider abdominal US if worsening back pain, abdominal pain, decreased pedal pulses, hypotension, or drop in H/H.   6.  Acute on chronic renal failure (CKD stage II). His baseline creatinine appears to be 1.3-1.5 although it has ranged from 1.7 to 4.19 in the last 2 months. He reports decreased PO intake and increased sweating on the day prior to admission, while taking lisinopril. He appeared euvolemic on initial physical exam, however, with pretibial edema but his urine osmolality and Na, with FeNa of 0.39% suggested pre-renal azothemia. He received IVF with NS of 1.6 L yesterday with IVF of NS@125ml /hr today. Urine output has been adequate at 0.61ml/kg/hr today. Creatinine has trended downward to 1.4 today which is right at his baseline.  He was encouraged to avoid nephrotoxic medications such as NSAIDs and to increase his fluid intake.   7. HTN. BP initially low at 101/57 while in ED. Pt reported  decreased PO intake and increased sweating 2/2 working outdoors and continued compliance with lisinopril. His BP increased to 138/69 with no fluid boluses. Given his recent TIA/CVA, mild hypertension will be permitted. BP however, has been  low today with asymptomatic bradycardia.  He lost his IV last night and we will work to try to get that replaced.   - Will consider increasing IVF fluids in AM but monitor BP closely, especially with hx of AAA (although s/p stent)  -Hold BP meds while pt hypotensive.  -Will monitor and resume lisinopril 10mg  if hypertensive  8. GERD. Pt with history of GERD but no complaints on admission and not on medications at home for this problem. Will continue to monitor.    LOS: 2 days   Laural Eiland 11/25/2011, 2:23 PM

## 2011-11-25 NOTE — Progress Notes (Signed)
Patient running in the 30's on tele. Md on call notified and ok as long as patient is asymptomatic.

## 2011-11-26 DIAGNOSIS — N179 Acute kidney failure, unspecified: Secondary | ICD-10-CM

## 2011-11-26 DIAGNOSIS — M549 Dorsalgia, unspecified: Secondary | ICD-10-CM

## 2011-11-26 DIAGNOSIS — I498 Other specified cardiac arrhythmias: Secondary | ICD-10-CM

## 2011-11-26 DIAGNOSIS — I6350 Cerebral infarction due to unspecified occlusion or stenosis of unspecified cerebral artery: Secondary | ICD-10-CM

## 2011-11-26 DIAGNOSIS — I4891 Unspecified atrial fibrillation: Secondary | ICD-10-CM

## 2011-11-26 DIAGNOSIS — I714 Abdominal aortic aneurysm, without rupture: Secondary | ICD-10-CM

## 2011-11-26 LAB — BASIC METABOLIC PANEL
Chloride: 106 mEq/L (ref 96–112)
GFR calc non Af Amer: 57 mL/min — ABNORMAL LOW (ref >90)
Glucose, Bld: 103 mg/dL — ABNORMAL HIGH (ref 70–99)
Potassium: 4.7 mEq/L (ref 3.5–5.1)
Sodium: 138 mEq/L (ref 135–145)

## 2011-11-26 MED ORDER — WARFARIN SODIUM 4 MG PO TABS
4.0000 mg | ORAL_TABLET | Freq: Once | ORAL | Status: DC
  Filled 2011-11-26: qty 1

## 2011-11-26 NOTE — Progress Notes (Signed)
Called by monitor tech at approx 0925 that patient had HR sustaining in the 30s. Immediately checked patient who was relaxing watching TV. He c/o no distress. BP manual was 130/80. HR on monitor when exiting the room was 52. MD paged with this info, no orders at this time. Will cont to monitor   Minor, Yvette Rack

## 2011-11-26 NOTE — Consult Note (Signed)
Reason for Consult: Bradycardia Referring Physician: Dr. Terrace Morgan Primary cardiologist: Dr. Paulette Morgan is an 66 y.o. male.  HPI: This 66 year old gentleman was admitted on 11/23/11 because of recurrent left-sided weakness.  He has had 2 previous similar episodes of transient left-sided weakness.  He has been evaluated by vascular surgery.  He has a known occluded left internal carotid artery and a known moderate stenosis of his right internal carotid artery.  We are called to see him because of marked bradycardia with heart rate in the 30s. The patient has a history of known coronary artery disease and had coronary artery bypass graft surgery in 2004.  His last nuclear stress test was in October 2010 elsewhere that was normal.  The patient has not been experiencing any chest pain or recurrent angina.  He has had no symptoms of congestive heart failure.  The patient has not had any dizziness or syncope or any symptoms referable to his chronic bradycardia.  He is not on any home medications or medications here in the hospital which would cause bradycardia.  At home he is on lisinopril 10 mg daily, simvastatin 10 mg daily, warfarin as directed, and Viagra 100 mg when necessary  Past Medical History  Diagnosis Date  . CAD (coronary artery disease) CABG in 2004    F/U by Dr Dylan Morgan cardiology, cardiolite scan done elsewhere in October 2010 that was normal, no ischemia seen, EF 55-65% in 02/2005  . GERD (gastroesophageal reflux disease)   . Hypertension   . Hyperlipidemia   . Renal insufficiency   . Cellulitis and abscess of leg     right leg  . Tobacco abuse   . Chronic back pain   . Chronic neck pain   . Seborrheic dermatitis of scalp   . Abnormal LFTs     Hx of abnormal LFT's  . Bradycardia   . AAA (abdominal aortic aneurysm)     November 2010, [place], November, 2010  . Paroxysmal atrial fibrillation 02/22/2009    diltiazem po started but discontinued due to side effects.  .  Stroke   . Ejection fraction     EF 40% in the past  /   EF 55-65% echo, November, 2006  . Hx of CABG     2004  . Carotid artery disease     Doppler, October, 2012, and old LICA occlusion , RICA no significant abnormality  . Arthritis   . Prediabetes   . CVA (cerebral infarction)     R Dylan Morgan radiata stroke 08/16/2011    Past Surgical History  Procedure Date  . Coronary artery bypass graft 2004  . Rotator cuff repair     Family History  Problem Relation Age of Onset  . Diabetes Mother   . Stroke Father   . Cancer Sister     thyroid cancer  . Heart disease Brother     Coronary artery disease    Social History:  reports that he quit smoking about 21 months ago. His smoking use included Cigarettes. He has never used smokeless tobacco. He reports that he does not drink alcohol or use illicit drugs.  Allergies:  Allergies  Allergen Reactions  . Diltiazem Hcl Other (See Comments)    unknown  . Oxycodone-Acetaminophen Other (See Comments)    unknown    Medications:  Scheduled:   . magnesium sulfate 1 - 4 g bolus IVPB  2 g Intravenous Once  . simvastatin  10 mg Oral QHS  . warfarin  2.5 mg Oral ONCE-1800  . warfarin  4 mg Oral ONCE-1800  . Warfarin - Pharmacist Dosing Inpatient   Does not apply q1800  . DISCONTD: warfarin  4 mg Oral ONCE-1800    Results for orders placed during the hospital encounter of 11/23/11 (from the past 48 hour(s))  GLUCOSE, CAPILLARY     Status: Normal   Collection Time   11/24/11 12:15 PM      Component Value Range Comment   Glucose-Capillary 96  70 - 99 mg/dL    Comment 1 Documented in Chart      Comment 2 Notify RN     GLUCOSE, CAPILLARY     Status: Normal   Collection Time   11/24/11  4:50 PM      Component Value Range Comment   Glucose-Capillary 95  70 - 99 mg/dL    Comment 1 Documented in Chart      Comment 2 Notify RN     PROTIME-INR     Status: Abnormal   Collection Time   11/25/11 10:13 AM      Component Value Range Comment    Prothrombin Time 35.4 (*) 11.6 - 15.2 seconds    INR 3.47 (*) 0.00 - 1.49   CBC     Status: Abnormal   Collection Time   11/25/11 10:13 AM      Component Value Range Comment   WBC 3.5 (*) 4.0 - 10.5 K/uL    RBC 4.10 (*) 4.22 - 5.81 MIL/uL    Hemoglobin 12.6 (*) 13.0 - 17.0 g/dL    HCT 13.0 (*) 86.5 - 52.0 %    MCV 90.2  78.0 - 100.0 fL    MCH 30.7  26.0 - 34.0 pg    MCHC 34.1  30.0 - 36.0 g/dL    RDW 78.4  69.6 - 29.5 %    Platelets 111 (*) 150 - 400 K/uL CONSISTENT WITH PREVIOUS RESULT  RENAL FUNCTION PANEL     Status: Abnormal   Collection Time   11/25/11 10:14 AM      Component Value Range Comment   Sodium 139  135 - 145 mEq/L    Potassium 5.4 (*) 3.5 - 5.1 mEq/L    Chloride 106  96 - 112 mEq/L    CO2 25  19 - 32 mEq/L    Glucose, Bld 86  70 - 99 mg/dL    BUN 32 (*) 6 - 23 mg/dL    Creatinine, Ser 2.84 (*) 0.50 - 1.35 mg/dL    Calcium 8.9  8.4 - 13.2 mg/dL    Phosphorus 2.0 (*) 2.3 - 4.6 mg/dL    Albumin 3.4 (*) 3.5 - 5.2 g/dL    GFR calc non Af Amer 50 (*) >90 mL/min    GFR calc Af Amer 58 (*) >90 mL/min   PROTIME-INR     Status: Abnormal   Collection Time   11/26/11  6:30 AM      Component Value Range Comment   Prothrombin Time 30.1 (*) 11.6 - 15.2 seconds    INR 2.82 (*) 0.00 - 1.49   BASIC METABOLIC PANEL     Status: Abnormal   Collection Time   11/26/11  8:05 AM      Component Value Range Comment   Sodium 138  135 - 145 mEq/L    Potassium 4.7  3.5 - 5.1 mEq/L    Chloride 106  96 - 112 mEq/L    CO2 24  19 - 32 mEq/L  Glucose, Bld 103 (*) 70 - 99 mg/dL    BUN 26 (*) 6 - 23 mg/dL    Creatinine, Ser 1.61  0.50 - 1.35 mg/dL    Calcium 8.9  8.4 - 09.6 mg/dL    GFR calc non Af Amer 57 (*) >90 mL/min    GFR calc Af Amer 67 (*) >90 mL/min   MAGNESIUM     Status: Normal   Collection Time   11/26/11 10:20 AM      Component Value Range Comment   Magnesium 1.7  1.5 - 2.5 mg/dL     Mri Brain Without Contrast  11/24/2011  *RADIOLOGY REPORT*  Clinical Data:  Left-sided  paresthesias.  MRI BRAIN WITHOUT CONTRAST MRA HEAD WITHOUT CONTRAST  Technique: Multiplanar, multiecho pulse sequences of the brain and surrounding structures were obtained according to standard protocol without intravenous contrast.  Angiographic images of the head were obtained using MRA technique without contrast.  Comparison: 11/23/2011 CT.  08/17/2011 MR.  MRI HEAD  Findings:  Motion degraded exam.  No acute infarct.  Moderate small vessel disease type changes. Remote small infarct posterior right corona radiata.  Global atrophy without hydrocephalus.  No intracranial mass lesion detected on this unenhanced exam.  Occluded left internal carotid artery.  Please see below.  No intracranial hemorrhage.  Exophthalmos.  Prior banding left globe.  Mild spinal stenosis C3-4.  Minimal paranasal sinus mucosal thickening.  IMPRESSION: No acute infarct.  Please see above.  MRA HEAD  Findings: Occluded left internal carotid artery.  Reconstitution of flow supraclinoid region.  Ectatic right internal carotid artery.  Mild irregularity of portions of the middle cerebral artery and anterior cerebral artery bilaterally but without high-grade proximal stenosis.  Irregular narrowed vertebral arteries and basilar artery with narrowing most notable distal right vertebral artery.  The full extent of the PICAs are not included present exam. Moderate to marked narrowing right PICA or poor delineation left PICA.  Poor delineation of the AICAs.  Small bulge at the expected origin of the right AICA.  Marked atherosclerotic type changes of the superior cerebral arteries and posterior cerebral arteries.  IMPRESSION: Occluded left internal carotid artery with significant intracranial atherosclerotic type changes once again noted.   Original Report Authenticated By: Fuller Canada, M.D.    Mr Mra Head/brain Wo Cm  11/24/2011  *RADIOLOGY REPORT*  Clinical Data:  Left-sided paresthesias.  MRI BRAIN WITHOUT CONTRAST MRA HEAD WITHOUT  CONTRAST  Technique: Multiplanar, multiecho pulse sequences of the brain and surrounding structures were obtained according to standard protocol without intravenous contrast.  Angiographic images of the head were obtained using MRA technique without contrast.  Comparison: 11/23/2011 CT.  08/17/2011 MR.  MRI HEAD  Findings:  Motion degraded exam.  No acute infarct.  Moderate small vessel disease type changes. Remote small infarct posterior right corona radiata.  Global atrophy without hydrocephalus.  No intracranial mass lesion detected on this unenhanced exam.  Occluded left internal carotid artery.  Please see below.  No intracranial hemorrhage.  Exophthalmos.  Prior banding left globe.  Mild spinal stenosis C3-4.  Minimal paranasal sinus mucosal thickening.  IMPRESSION: No acute infarct.  Please see above.  MRA HEAD  Findings: Occluded left internal carotid artery.  Reconstitution of flow supraclinoid region.  Ectatic right internal carotid artery.  Mild irregularity of portions of the middle cerebral artery and anterior cerebral artery bilaterally but without high-grade proximal stenosis.  Irregular narrowed vertebral arteries and basilar artery with narrowing most notable distal right vertebral artery.  The full extent of the PICAs are not included present exam. Moderate to marked narrowing right PICA or poor delineation left PICA.  Poor delineation of the AICAs.  Small bulge at the expected origin of the right AICA.  Marked atherosclerotic type changes of the superior cerebral arteries and posterior cerebral arteries.  IMPRESSION: Occluded left internal carotid artery with significant intracranial atherosclerotic type changes once again noted.   Original Report Authenticated By: Fuller Canada, M.D.     Review of systems: No chest pain, no dizzy spells or syncope   Blood pressure 147/77, pulse 63, temperature 97.6 F (36.4 C), temperature source Oral, resp. rate 20, height 5\' 11"  (1.803 m), weight 243  lb 9.6 oz (110.496 kg), SpO2 100.00%. The patient appears to be in no distress.  Head and neck exam reveals that the pupils are equal and reactive.  The extraocular movements are full.  There is no scleral icterus.  Mouth and pharynx are benign.  No lymphadenopathy.  There is a medium intensity systolic bruit over the right carotid artery .The jugular venous pressure is normal.  Thyroid is not enlarged or tender.  Chest is clear to percussion and auscultation.  No rales or rhonchi.  Expansion of the chest is symmetrical.  Prior scar from sternotomy from CABG as noted  Heart reveals no abnormal lift or heave.  First and second heart sounds are normal.  There is no murmur gallop rub or click.  The abdomen is soft and nontender.  Bowel sounds are normoactive.  There is no hepatosplenomegaly or mass.  There are no abdominal bruits.  Extremities reveal no phlebitis or edema.  Pedal pulses are good.  There is no cyanosis or clubbing.  Neurologic exam is normal strength and no lateralizing weakness.  No sensory deficits.  Integument reveals no rash  EKG dated 11/25/11 shows sinus bradycardia with left anterior hemiblock and incomplete right bundle branch block.  No acute ischemic changes.  [phone]  ------------------------------------------------------------ Echocardiography  Patient: Dylan Morgan, Dylan Morgan MR #: [id number] Study Date: 11/24/2011 Gender: M Age: 43 Height: 180.3cm Weight: 110.5kg BSA: 2.73m^2 Pt. Status: Room: 4N19C  PERFORMING [place] ATTENDING Ileana Roup SONOGRAPHER Cathie Beams ORDERING Powers, Marwan cc:  ------------------------------------------------------------ LV EF: 65% - 70%  ------------------------------------------------------------ Indications: Previous study 01/01/2011. TIA 435.9.  ------------------------------------------------------------ History: PMH: History of CVA. Reflux. Acute on chronic renal failure. Carotid artery  disease. Depressive disorder. Prediabetes. History of atrial fibrillation. Coronary artery disease. Risk factors: Hypertension.  ------------------------------------------------------------ Study Conclusions  Left ventricle: The cavity size was normal. Wall thickness was increased in a pattern of moderate LVH. Systolic function was vigorous. The estimated ejection fraction was in the range of 65% to 70%. Wall motion was normal; there were no regional wall motion abnormalities. Left ventricular diastolic function parameters were normal.  ------------------------------------------------------------  blood work on admission showed mild azotemia and mild hyperkalemia.   These parameters have improved with IV hydration overnight    Assessment/Plan: 1. recurrent TIA involving the right middle cerebral artery territory  2. coronary artery disease, status post CABG in 2004, stable  3. marked sinus bradycardia, asymptomatic  4. past history of atrial fibrillation on long-term Coumadin   Recommendation: The patient is asymptomatic from his sinus bradycardia.  He was not on any medications which would cause bradycardia.  We will continue to observe on telemetry.  If he becomes symptomatic we can give him a trial of intravenous atropine 0.5 mg IV but at the present time he requires no treatment.  No indication for pacemaker at this time. Will follow with you  Cassell Clement 11/26/2011, 11:30 AM

## 2011-11-26 NOTE — Discharge Summary (Signed)
Internal Medicine Teaching [facility] Discharge Note  Name: Dylan Morgan MRN: [id number] DOB: 1945-11-22 66 y.o.  Date of Admission: 11/23/2011  1:35 PM Date of Discharge: 11/26/2011 Attending Physician: Farley Ly, MD  Discharge Diagnosis: 1. TIA with history of CVA 2. Paroxysmal Atrial fibrillation with slow ventricular response 3.  Coronary artery disease 4. Carotid arterial disease 5. Acute on Chronic Renal failure (CKD stage II) 6. History of fall 7. Hypertension 8. GERD 9. Hx of AAA s/p endovascular repair.  Discharge Medications: Medication List  As of 12/04/2011  2:49 PM   STOP taking these medications         ibuprofen 200 MG tablet         TAKE these medications         lisinopril 10 MG tablet   Commonly known as: PRINIVIL,ZESTRIL   Take 1 tablet (10 mg total) by mouth daily.      multivitamin with minerals tablet   Take 1 tablet by mouth daily.      sildenafil 100 MG tablet   Commonly known as: VIAGRA   Take 100 mg by mouth daily as needed. For erectile dysfunction      simvastatin 10 MG tablet   Commonly known as: ZOCOR   Take 10 mg by mouth at bedtime.      warfarin 5 MG tablet   Commonly known as: COUMADIN   Take 1 tablet (5 mg total) by mouth daily. 90 day supply           Disposition and follow-up:   Mr.Selden L Mangione was discharged from [facility] in Good condition.  At the hospital follow up visit please address   1.  Follow up with Dr. Pearlean Brownie.  Please comment if you think his recurrent TIAs are secondary to his carotid disease.    2.  Vascular surgery follow up after seeing Dr. Pearlean Brownie.    3.  Clinic follow up:   - Recheck Bmet to follow creatinine and potassium  - Consider increasing his lisinopril  - Medication and dietary compliance.   4.  Cardiology follow up:  Follow his bradycardia for possible symptoms as well as rechecking his INR.  Follow-up Appointments: Follow-up Information    Follow up with  Denton Ar, MD. (We will call with an appointment in 1 week)    Contact information:   [address] [place] [address] [phone]       Follow up with Gates Rigg, MD. (They will call you with a follow up appointment)    Contact information:   [address], [address] [facility] [place] [address] [phone]       Call Willa Rough, MD. (to make an appointment for follow up from this hospitalization)    Contact information:   [address] [place] [address] [phone]       Follow up with EARLY, TODD, MD. (They will call you with a follow up appointment)    Contact information:   [address] [place] [address] [phone]         Discharge Orders    Future Appointments: Provider: Department: Dept Phone: Center:   12/08/2011 1:30 PM [facility] Pv 2 [facility] [phone] None     Future Orders Please Complete By Expires   Diet - low sodium heart healthy      Increase activity slowly      Discharge instructions  Comments:   1.  Continue your medications as prescribed.  2.  You need to call Dr. Henrietta Hoover office on Monday to make an appointment to have your coumadin followed  3.  You will be called with follow up appointments for your primary care doctor as well as the stroke follow up doctor, Dr. Pearlean Brownie.     Consultations: Treatment Team:  Larina Earthly, MD Md Stroke, MD Cassell Clement, MD  Procedures Performed:  Ct Head Wo Contrast  11/23/2011  *RADIOLOGY REPORT*  Clinical Data: Code stroke.  Left-sided parasthesias.  CT HEAD WITHOUT CONTRAST  Technique:  Contiguous axial images were obtained from the base of the skull through the vertex without contrast.  Comparison: 08/17/2011  Findings: Despite efforts by the patient and technologist, motion artifact is present on some series of today's examination and could not be totally eliminated.   This reduces diagnostic sensitivity and specificity.  The brain stem, cerebellum, cerebral peduncles, basal ganglia, thalami, lateral ventricles, and basilar cisterns appear unremarkable.  Periventricular and corona radiata white matter hypodensities are most compatible with chronic ischemic microvascular white matter disease.  Faintly increased periventricular white matter hypodensity on the right, for example on image 19 of series 2, is likely a manifestation of the prior lacunar infarcts in this vicinity shown on MRI from 08/17/2011.  Otherwise no significant change.  No intracranial hemorrhage or mass lesion noted.  Slight asymmetry in density in the middle cerebral arteries is primarily attributed to motion artifact; the density the right MCA appears relatively similar to the prior exam, and the left is somewhat obscured by the motion artifact.  Left scleral band noted.  IMPRESSION:  1.  Right periventricular white matter hypodensities adjacent to the right lateral ventricle likely reflect evolutionary findings from prior lacunar infarcts shown in May 2013.  These are somewhat indistinct against the background of chronic microvascular white matter disease.  No definite new infarct, mass lesion, or intracranial hemorrhage is apparent on today's CT scan.  These results were called by telephone on 11/23/2011 at 2:07 p.m. to Dr. Noel Christmas, who verbally acknowledged these results.   Original Report Authenticated By: Dellia Cloud, M.D.    Mri Brain Without Contrast  11/24/2011  *RADIOLOGY REPORT*  Clinical Data:  Left-sided paresthesias.  MRI BRAIN WITHOUT CONTRAST MRA HEAD WITHOUT CONTRAST  Technique: Multiplanar, multiecho pulse sequences of the brain and surrounding structures were obtained according to standard protocol without intravenous contrast.  Angiographic images of the head were obtained using MRA technique without contrast.  Comparison: 11/23/2011 CT.  08/17/2011 MR.  MRI HEAD  Findings:   Motion degraded exam.  No acute infarct.  Moderate small vessel disease type changes. Remote small infarct posterior right corona radiata.  Global atrophy without hydrocephalus.  No intracranial mass lesion detected on this unenhanced exam.  Occluded left internal carotid artery.  Please see below.  No intracranial hemorrhage.  Exophthalmos.  Prior banding left globe.  Mild spinal stenosis C3-4.  Minimal paranasal sinus mucosal thickening.  IMPRESSION: No acute infarct.  Please see above.  MRA HEAD  Findings: Occluded left internal carotid artery.  Reconstitution of flow supraclinoid region.  Ectatic right internal carotid artery.  Mild irregularity of portions of the middle cerebral artery and anterior cerebral artery bilaterally but without high-grade proximal stenosis.  Irregular narrowed vertebral arteries and basilar artery with narrowing most notable distal right vertebral artery.  The full extent of the PICAs are not included present exam. Moderate to marked narrowing right PICA or  poor delineation left PICA.  Poor delineation of the AICAs.  Small bulge at the expected origin of the right AICA.  Marked atherosclerotic type changes of the superior cerebral arteries and posterior cerebral arteries.  IMPRESSION: Occluded left internal carotid artery with significant intracranial atherosclerotic type changes once again noted.   Original Report Authenticated By: Fuller Canada, M.D.    Dg Chest Port 1 View  11/23/2011  *RADIOLOGY REPORT*  Clinical Data: Code stroke  PORTABLE CHEST - 1 VIEW  Comparison: 09/14/2011  Findings: Mild cardiomegaly.  Clear lungs.  No pneumothorax or pleural effusion. Normal vascularity.  IMPRESSION: Cardiomegaly without edema.   Original Report Authenticated By: Donavan Burnet, M.D.    2D Echo: Study Conclusions  Left ventricle: The cavity size was normal. Wall thickness was increased in a pattern of moderate LVH. Systolic function was vigorous. The estimated ejection fraction  was in the range of 65% to 70%. Wall motion was normal; there were no regional wall motion abnormalities. Left ventricular diastolic function parameters were normal.  Admission HPI: Mr. Weida is a 66 year-old man with PMH significant for CAD s/p CABG in 2004, multiple CVAs with on neurologic deficits, CKD stage II, HTN, OSA not on CPAP, and Paroxysmal Afib on coumadin for last two months who presented to the [facility] ED for evaluation of new left sided weakness. He was in his usual state of health this morning, approximately 10 AM when he started feeling left sided weakness in his arm, leg and face. He was at work at that time. He drove himself home, but noticed that the left weakness progressively worsened and a friend drove him to the [facility] ED. At this time he reports that his weakness has improved significantly.   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 He has had chronic back pain and denies severe low back pain at this time.   He has occasional headaches that last for one hour but subside with Advil. The headache is described as a dull ache, frontal, bilateral, with no photophobia, or increased lacrimation.   He denies headache, confusion, chest pain, shortness of breath, abdominal pain, no increased or decreased urinary frequency, hematuria, or blood in his stools.   Hospital Course by problem list: 1. TIA: On admission Mr. Williamsen had been complaining of left sided weakness for one day.  His initial NIHSS was 4 and he had presented to the ED >4 hours after initiation of his symptoms.  He also was on coumadin and therefore he was not a candidate for tPA. His INR was supratherapeutic on the day of admission and he reported a fall on the prior to his presentation, raising concern for a hemorraghic stroke. However, his CT head without contrast showed no acute intracranial abnormality such as acute or  subacute bleeding. He has had CVAs and at least one TIA in the past, as recent as May 2013 involving his right subcortical middle cerebral artery. Neurology was consulted and saw him while he was in the ED and suspect probable recurrent TIA involving the right subcortical MCA though recurrent subcortical right MCA cannot be ruled out. His management and treatment will be focused on risk stratification and modification. His identified stroke risk factors are Afib, carotid artery stenosis, hyperlipidemia, and hypertension. By the day after admission his symptoms had resolved with no residual left sided weakness or slurred speech. 2D echo  was performed and was unchanged from previous.  Repeat Carotid artery duplex revealed 40-59% internal carotid artery stenosis with increased velocity/compensatory flow, and left ICA occlusion with suggested vertebral artery proximal stenosis which was unchanged from previous.  MRI/MRA showed no acute infarct but continued to show an occluded left internal carotid artery with significant intracranial atherosclerotic type changes. PT and OT were consulted and recommend outpatient follow up. SLP had no recommendations. His LDL was 46 which is well controlled and his A1C was 5.8 which is also excellent. His blood pressure has been well controlled throughout admission with SBP ranging between 90s and 130s. With his recurrent TIA symptoms and his known carotid artery disease Vascular surgery was consulted to consider CEA. They state that if his stroke is thought to be secondary to his known carotid disease. Neurology has seen him and states that his strokes could be secondary to bradycardia or his carotid disease and they suggested follow up with Dr. Pearlean Brownie as well as getting cardiology to follow for possible bradycardia treatment. Dr. Patty Sermons saw him and states that he is currently not a candidate for a pacer and that he should follow up closely with Dr. Myrtis Ser. We will plan on discharging  today on his coumadin with the follow ups needed.    2. Paroxysmal A fib with a slow ventricular response: He is followed by Dr. Myrtis Ser at [facility], was recently diagnosed with paroxymal Afib and was started on coumadin. He is followed at the [facility] clinic for his therapy. At presentation he was not in Afib, however his INR was supratherapeutic at 4.55. He reported decreased PO intake two days prior to his admission and his renal function may have declined from baseline all of which could cause the supratherapeutic INR.  He denies bleeding but has noticed easy bruising. He has had several episodes of bradycardia without any nodal blocking agents on board during this admission. They have all happened while he is asleep and asymptomatic. He was seen by Dr. Patty Sermons and he states that since he is not a candidate for a pacer at this time and he has been asymptomatic that he will need close follow up with Dr. Myrtis Ser.    3. CAD/carotid artery disease. He has known complete occlusion of the left internal carotid and moderate stenosis of right internal carotid. He is s/p CABG in 2006, although we cannot find records at this time. He is followed by Dr. Myrtis Ser in [facility] cardiology. His last echo was in 2006. Bilateral carotid artery doppler confirms left ICA occlusion, moderate right ICA occlusion. Direct LDL at 46.  Vascular surgery was consulted and stated that he would be a candidate for CEA if neurology thought his TIAs were secondary to his carotid disease.  Mr. Teall will follow up with Dr. Pearlean Brownie and then follow up with Vascular surgery to ascertain his eligibility for surgery.    4. Acute on chronic renal failure (CKD stage II). His baseline creatinine appears to be 1.3-1.5 although it has ranged from 1.7 to 4.19 in the last 2 months. He reports decreased PO intake and increased sweating on the day prior to admission, while taking lisinopril. He appeared euvolemic on initial physical exam, however,  with pretibial edema but his urine osmolality and Na, with FeNa of 0.39% suggested pre-renal azothemia. He received IVF and had urine output has been adequate at 0.58ml/kg/hr.  Creatinine has trended downward to 1.4 today which is right at his baseline. He was encouraged to avoid nephrotoxic medications such  as NSAIDs and to increase his fluid intake.   5. Recent fall. He reports falling from a ladder (~ 4 feet heigth) one day prior to his admission while working/helping a friend. He denied severe pain though he feels sore with no bruises or hematuria. He has paraspinal musculoskeletal pain and his total Ck and CKMB were elevated. This could be a component of MSK injury and decreased PO, possibly pre-renal azotemia. Given his supratherapeutic INR and AAA with stent we monitored his H/H and watched for signs of bleeding. UA negative for Hgb. Hemoglobin was stable throughout admission.    6. AAA. The patient has a history of abdominal aortic aneurysm with stent in 2010. He has no abdominal pain but had a recent fall.  He was stable throughout admission with no major increase in his back pain.    7. HTN. His blood pressure was initially low at 101/57 while in ED. The patient reported decreased PO intake and increased sweating while working outdoors and continued compliance with lisinopril. His BP increased to 138/69 with no fluid boluses. Given his recent TIA/CVA, mild hypertension will be permitted. He underwent some fluid hydration and his blood pressure returned to his baseline.  On discharge his home lisinopril was restarted.  He has contraindications to any beta blocking agents because of his asymptomatic bradycardia.   8. GERD. He has a history of GERD but no complaints on admission and not on medications at home for this problem. This will continue to be followed as an outpatient.   Discharge Vitals:  BP 147/77  Pulse 63  Temp 97.6 F (36.4 C) (Oral)  Resp 20  Ht 5\' 11"  (1.803 m)  Wt 243 lb 9.6 oz  (110.496 kg)  BMI 33.98 kg/m2  SpO2 100%  Discharge Labs:  Results for orders placed during the hospital encounter of 11/23/11 (from the past 24 hour(s))  PROTIME-INR     Status: Abnormal   Collection Time   11/26/11  6:30 AM      Component Value Range   Prothrombin Time 30.1 (*) 11.6 - 15.2 seconds   INR 2.82 (*) 0.00 - 1.49  BASIC METABOLIC PANEL     Status: Abnormal   Collection Time   11/26/11  8:05 AM      Component Value Range   Sodium 138  135 - 145 mEq/L   Potassium 4.7  3.5 - 5.1 mEq/L   Chloride 106  96 - 112 mEq/L   CO2 24  19 - 32 mEq/L   Glucose, Bld 103 (*) 70 - 99 mg/dL   BUN 26 (*) 6 - 23 mg/dL   Creatinine, Ser 1.61  0.50 - 1.35 mg/dL   Calcium 8.9  8.4 - 09.6 mg/dL   GFR calc non Af Amer 57 (*) >90 mL/min   GFR calc Af Amer 67 (*) >90 mL/min  MAGNESIUM     Status: Normal   Collection Time   11/26/11 10:20 AM      Component Value Range   Magnesium 1.7  1.5 - 2.5 mg/dL    Signed: Honestee Revard 11/26/2011, 1:26 PM   Time Spent on Discharge: 45 min

## 2011-11-26 NOTE — Progress Notes (Signed)
[name] is an 66 y.o. male with a history of previous right cerebral infarction in September 2012,   paroxysmal atrial fibrillation on anticoagulation with Coumadin, coronary disease, hypertension, hyperlipidemia and known left internal carotid occlusion as well as moderate stenosis of right internal carotid, presenting with acute onset of numbness involving his left side arm,  left facial droop, left arm heaviness, dropping, left leg dragging in 08/22, lasting  30-60 minutes. This is the 3rd similar episode. He had total of three similar episode in past 12 months.   INR was 4.55 upon presentation. MRI brain showed moderate small vessel disease, no acute lesions.  MRA showedOccluded left internal carotid artery with significant intracranial  atherosclerotic type changes.   ECHO: moderate LVH. Systolic function was vigorous. With EF 65% to 70%. Wall motion was normal;    US Carotid:Right: 40-59% internal carotid artery stenosis. Increased velocities may be due to compensatory flow. Left: Occluded internal carotid artery. Right: Vertebral artery demonstrates the "bunny sign" waveform suggesting proximal stenosis. Left: Vertebral artery flow is antegrade   He was evaluated by Vascular surgeon Dr. Gretta Morgan, considering the possibility of right carotid endarterectomy, want neurologist input on this.  Patient has know paroxysmal Afib, bradycardia, multiple low heart rate to 30s, but he was asymptomatic. I have consulted cardiologist.     Past Medical History   Diagnosis  Date   .  CAD (coronary artery disease)  CABG in 2004     F/U by Dr Dylan Morgan cardiology, cardiolite scan done elsewhere in October 2010 that was normal, no ischemia seen, EF 55-65% in 02/2005   .  GERD (gastroesophageal reflux disease)    .  Hypertension    .  Hyperlipidemia    .  Renal insufficiency    .  Cellulitis and abscess of leg      right leg   .  Tobacco abuse    .  Chronic back pain    .  Chronic neck  pain    .  Seborrheic dermatitis of scalp    .  Abnormal LFTs      Hx of abnormal LFT's   .  Bradycardia    .  AAA (abdominal aortic aneurysm)      November 2010, Stenting, November, 2010   .  Paroxysmal atrial fibrillation  02/22/2009     diltiazem po started but discontinued due to side effects.   .  Stroke    .  Ejection fraction      EF 40% in the past / EF 55-65% echo, November, 2006   .  Hx of CABG      2004   .  Carotid artery disease      Doppler, October, 2012, and old LICA occlusion , RICA no significant abnormality   .  Arthritis    .  Prediabetes    .  CVA (cerebral infarction)      R Carona radiata stroke 08/16/2011    Family History   Problem  Relation  Age of Onset   .  Diabetes  Mother    .  Stroke  Father    .  Cancer  Sister       thyroid cancer    .  Heart disease  Brother       Coronary artery disease    Medications:  Prior to Admission:  Ibuprofen 200 mg every 6 hours when necessary pain  Lisinopril 10 mg per day  Multivitamins with minerals  one tablet daily  Viagra 100 mg per day when necessary  Zocor 10 mg at bedtime  Coumadin 5 mg per day  Physical Examination:  Blood pressure 93/56, pulse 61, temperature 97.9 F (36.6 C), temperature source Oral, resp. rate 17, SpO2 96.00%.  Neurologic Examination:  Mental Status:  Alert, oriented, thought content appropriate. Speech slightly dysarthric as patient is edentulous, but without evidence of aphasia. Able to follow commands without difficulty.  Cranial Nerves:  II-Visual fields were normal. 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 Extraocular movements were full and conjugate.  V/VII-no facial numbness and no facial weakness.  VIII-normal.  X-slightly dysarthric speech.  XII-midline tongue extension  Motor: 5/5 bilaterally with normal tone and bulk  Sensory: Normal throughout.  Deep Tendon Reflexes: 2+ and symmetric.  Plantars: Flexor bilaterally  Cerebellar:  slightly reduced coordination of left upper and lower extremities.  Carotid auscultation: Normal     MRI from 08/17/2011.   Right periventricular white matter hypodensities adjacent to the right lateral ventricle likely reflect evolutionary findings from prior lacunar infarcts shown in May 2013. No acute lesion.   Assessment: 66 y.o. male probable recurrent TIA involving right subcortical middle cerebral artery territory.  He is INR on Coumadin.  Stroke Risk Factors - atrial fibrillation, carotid stenosis, hyperlipidemia and hypertension   Plan:  The mechanism of his right hemisphere TIA could due to combination of bradycardia, hypoperfusion, vs right arterial-arterial embolic event. Questionable candidate for right carotid endarterectomy.  1. Keep coumdin. 2.consult cardiologist for future management of bradycardia. 3. Dr. Pearlean Morgan will follow up on patient in Monday.

## 2011-11-26 NOTE — Progress Notes (Signed)
ANTICOAGULATION CONSULT NOTE - Follow Up Consult  Pharmacy Consult:  Coumadin Indication: atrial fibrillation  Allergies  Allergen Reactions  . Diltiazem Hcl Other (See Comments)    unknown  . Oxycodone-Acetaminophen Other (See Comments)    unknown    Patient Measurements: Height: 5\' 11"  (180.3 cm) Weight: 243 lb 9.6 oz (110.496 kg) IBW/kg (Calculated) : 75.3   Vital Signs: Temp: 98 F (36.7 C) (08/25 0600) Temp src: Oral (08/25 0600) BP: 134/66 mmHg (08/25 0600) Pulse Rate: 74  (08/25 0600)  Labs:  Basename 11/26/11 0630 11/25/11 1014 11/25/11 1013 11/24/11 0915 11/24/11 0332 11/23/11 2136 11/23/11 1401 11/23/11 1348  HGB -- -- 12.6* 11.9* -- -- -- --  HCT -- -- 37.0* 35.3* -- -- 43.0 --  PLT -- -- 111* 109* -- -- -- 156  APTT -- -- -- -- -- -- -- 55*  LABPROT 30.1* -- 35.4* -- 46.4* -- -- --  INR 2.82* -- 3.47* -- 4.90* -- -- --  HEPARINUNFRC -- -- -- -- -- -- -- --  CREATININE -- 1.40* -- 2.02* -- -- 3.70* --  CKTOTAL -- -- -- 273* 371* 413* -- --  CKMB -- -- -- 4.4* 5.3* 6.3* -- --  TROPONINI -- -- -- <0.30 <0.30 <0.30 -- --    Estimated Creatinine Clearance: 63.9 ml/min (by C-G formula based on Cr of 1.4).     Assessment: 66 YOM on Coumadin for history of atrial fibrillation.  INR decreased to therapeutic level today, no bleeding reported.    Goal of Therapy:  INR 2-3    Plan:  - Coumadin 4mg  PO today - Daily PT / INR    Dorann Davidson D. Laney Potash, PharmD, BCPS Pager:  [phone] 11/26/2011, 8:31 AM

## 2011-11-26 NOTE — Progress Notes (Signed)
Subjective: Doing well today, no complaints.  Did have an episode of bradycardia again while sleeping last night and this morning.  He remains asymptomatic during these episodes.   Objective: Vital signs in last 24 hours: Filed Vitals:   11/26/11 0928 11/26/11 1013 11/26/11 1020 11/26/11 1046  BP:   147/77   Pulse: 52 50 95 63  Temp:   97.6 F (36.4 C)   TempSrc:      Resp:   20   Height:      Weight:      SpO2:   100%    Weight change:   Intake/Output Summary (Last 24 hours) at 11/26/11 1302 Last data filed at 11/26/11 1115  Gross per 24 hour  Intake   2290 ml  Output   2000 ml  Net    290 ml  Vitals reviewed.  General: resting in bed, NAD  HEENT: PERRL, EOMI, no scleral icterus  Cardiac: RRR, no rubs, murmurs or gallops  Pulm: clear to auscultation bilaterally, no wheezes, rales, or rhonchi  Abd: soft, nontender, nondistended, BS present  Ext: warm and well perfused, no pedal edema, mild paraspinal tenderness to palpation, no ecchymoses present  Neuro: alert and oriented X3, cranial nerves II-XII grossly intact, strength in the upper and lower extremities is 5/5 and symmetric. Sensation to light touch equal in bilateral upper and lower extremities  Lab Results: Basic Metabolic Panel:  Lab 11/26/11 0981 11/26/11 0805 11/25/11 1014  NA -- 138 139  K -- 4.7 5.4*  CL -- 106 106  CO2 -- 24 25  GLUCOSE -- 103* 86  BUN -- 26* 32*  CREATININE -- 1.25 1.40*  CALCIUM -- 8.9 8.9  MG 1.7 -- --  PHOS -- -- 2.0*   Liver Function Tests:  Lab 11/25/11 1014 11/23/11 1348  AST -- 36  ALT -- 23  ALKPHOS -- 83  BILITOT -- 0.3  PROT -- 8.3  ALBUMIN 3.4* 4.2   CBC:  Lab 11/25/11 1013 11/24/11 0915 11/23/11 1348  WBC 3.5* 3.2* --  NEUTROABS -- -- 4.0  HGB 12.6* 11.9* --  HCT 37.0* 35.3* --  MCV 90.2 90.3 --  PLT 111* 109* --   Cardiac Enzymes:  Lab 11/24/11 0915 11/24/11 0332 11/23/11 2136  CKTOTAL 273* 371* 413*  CKMB 4.4* 5.3* 6.3*  CKMBINDEX -- -- --    TROPONINI <0.30 <0.30 <0.30   CBG:  Lab 11/24/11 1650 11/24/11 1215 11/24/11 0734 11/23/11 2139  GLUCAP 95 96 97 117*   Hemoglobin A1C:  Lab 11/23/11 2136  HGBA1C 5.8*   Fasting Lipid Panel:  Lab 11/24/11 0915 11/24/11 0332  CHOL -- 136  HDL -- 35*  LDLCALC -- UNABLE TO CALCULATE IF TRIGLYCERIDE OVER 400 mg/dL  TRIG -- 191*  CHOLHDL -- 3.9  LDLDIRECT 46 --   Coagulation:  Lab 11/26/11 0630 11/25/11 1013 11/24/11 0332 11/23/11 1348  LABPROT 30.1* 35.4* 46.4* 43.8*  INR 2.82* 3.47* 4.90* 4.55*   Drugs of Abuse     Component Value Date/Time   LABOPIA NONE DETECTED 11/23/2011 2129   COCAINSCRNUR NONE DETECTED 11/23/2011 2129   LABBENZ NONE DETECTED 11/23/2011 2129   AMPHETMU NONE DETECTED 11/23/2011 2129   THCU NONE DETECTED 11/23/2011 2129   LABBARB NONE DETECTED 11/23/2011 2129    Urinalysis:  Lab 11/23/11 2224  COLORURINE YELLOW  LABSPEC 1.025  PHURINE 5.0  GLUCOSEU NEGATIVE  HGBUR NEGATIVE  BILIRUBINUR NEGATIVE  KETONESUR NEGATIVE  PROTEINUR NEGATIVE  UROBILINOGEN 0.2  NITRITE NEGATIVE  LEUKOCYTESUR NEGATIVE   Micro Results: Recent Results (from the past 240 hour(s))  MRSA PCR SCREENING     Status: Normal   Collection Time   11/24/11 12:27 AM      Component Value Range Status Comment   MRSA by PCR NEGATIVE  NEGATIVE Final    Studies/Results: Mri Brain Without Contrast  11/24/2011  *RADIOLOGY REPORT*  Clinical Data:  Left-sided paresthesias.  MRI BRAIN WITHOUT CONTRAST MRA HEAD WITHOUT CONTRAST  Technique: Multiplanar, multiecho pulse sequences of the brain and surrounding structures were obtained according to standard protocol without intravenous contrast.  Angiographic images of the head were obtained using MRA technique without contrast.  Comparison: 11/23/2011 CT.  08/17/2011 MR.  MRI HEAD  Findings:  Motion degraded exam.  No acute infarct.  Moderate small vessel disease type changes. Remote small infarct posterior right corona radiata.  Global atrophy  without hydrocephalus.  No intracranial mass lesion detected on this unenhanced exam.  Occluded left internal carotid artery.  Please see below.  No intracranial hemorrhage.  Exophthalmos.  Prior banding left globe.  Mild spinal stenosis C3-4.  Minimal paranasal sinus mucosal thickening.  IMPRESSION: No acute infarct.  Please see above.  MRA HEAD  Findings: Occluded left internal carotid artery.  Reconstitution of flow supraclinoid region.  Ectatic right internal carotid artery.  Mild irregularity of portions of the middle cerebral artery and anterior cerebral artery bilaterally but without high-grade proximal stenosis.  Irregular narrowed vertebral arteries and basilar artery with narrowing most notable distal right vertebral artery.  The full extent of the PICAs are not included present exam. Moderate to marked narrowing right PICA or poor delineation left PICA.  Poor delineation of the AICAs.  Small bulge at the expected origin of the right AICA.  Marked atherosclerotic type changes of the superior cerebral arteries and posterior cerebral arteries.  IMPRESSION: Occluded left internal carotid artery with significant intracranial atherosclerotic type changes once again noted.   Original Report Authenticated By: Fuller Canada, M.D.    Mr Mra Head/brain Wo Cm  11/24/2011  *RADIOLOGY REPORT*  Clinical Data:  Left-sided paresthesias.  MRI BRAIN WITHOUT CONTRAST MRA HEAD WITHOUT CONTRAST  Technique: Multiplanar, multiecho pulse sequences of the brain and surrounding structures were obtained according to standard protocol without intravenous contrast.  Angiographic images of the head were obtained using MRA technique without contrast.  Comparison: 11/23/2011 CT.  08/17/2011 MR.  MRI HEAD  Findings:  Motion degraded exam.  No acute infarct.  Moderate small vessel disease type changes. Remote small infarct posterior right corona radiata.  Global atrophy without hydrocephalus.  No intracranial mass lesion detected on  this unenhanced exam.  Occluded left internal carotid artery.  Please see below.  No intracranial hemorrhage.  Exophthalmos.  Prior banding left globe.  Mild spinal stenosis C3-4.  Minimal paranasal sinus mucosal thickening.  IMPRESSION: No acute infarct.  Please see above.  MRA HEAD  Findings: Occluded left internal carotid artery.  Reconstitution of flow supraclinoid region.  Ectatic right internal carotid artery.  Mild irregularity of portions of the middle cerebral artery and anterior cerebral artery bilaterally but without high-grade proximal stenosis.  Irregular narrowed vertebral arteries and basilar artery with narrowing most notable distal right vertebral artery.  The full extent of the PICAs are not included present exam. Moderate to marked narrowing right PICA or poor delineation left PICA.  Poor delineation of the AICAs.  Small bulge at the expected origin of the right AICA.  Marked atherosclerotic type changes of the  superior cerebral arteries and posterior cerebral arteries.  IMPRESSION: Occluded left internal carotid artery with significant intracranial atherosclerotic type changes once again noted.   Original Report Authenticated By: Fuller Canada, M.D.    Medications: I have reviewed the patient's current medications. Scheduled Meds:   . magnesium sulfate 1 - 4 g bolus IVPB  2 g Intravenous Once  . simvastatin  10 mg Oral QHS  . warfarin  2.5 mg Oral ONCE-1800  . warfarin  4 mg Oral ONCE-1800  . Warfarin - Pharmacist Dosing Inpatient   Does not apply q1800  . DISCONTD: warfarin  4 mg Oral ONCE-1800   Continuous Infusions:   . sodium chloride 150 mL/hr at 11/26/11 0624   PRN Meds:.  Assessment/Plan: Dylan Morgan is a 66 year-old man with PMH significant for previous CVA in 9/12, TIA in 5/13, paroxysmal Afib on anticoagulation, CAD, HTN, hyperlipidemia, left internal carotid occlusion and moderate stenosis of the right internal carotid who presented with Left side weakness starting  at around 10AM on admission day.   1. TIA: His initial NIHSS was 4. His symptoms started at 10 AM on the day of admission and he presented to the ED >4 hours after initiation of his symptoms and he was on coumadin. He was, therefore, not a candidate for tPA. His INR was supratherapeutic on the day of admission and he reported a fall on the prior to his presentation, raising concern for a hemorraghic stroke. However, his CT head without contrast showed no acute intracranial abnormality such as acute or subacute bleeding. He has had CVAs and at least one TIA in the past, as recent as May 2013 involving his right subcortical middle cerebral artery. Neurology saw him while at the ED and suspect probable recurrent TIA involving the right subcortical MCA though recurrent subcortical right MCA cannot be ruled out. His management and treatment will be focused on risk stratification and modification. His identified stroke risk factors are Afib, carotid artery stenosis, hyperlipidemia, and hypertension. By the day after admission his symptoms had resolved with no residual left sided weakness or slurred speech. 2D echo: EF 65-70%, moderate LV, no wall motion or valve abnormality. Repeat Carotid artery duplex revealed 40-59% internal carotid artery stenosis with increased velocity/compensatory flow, and left ICA occlusion with suggested vertebral artery proximal stenosis. MRI/MRA showed no acute infarct but occluded left internal carotid artery with significant intracranial atherosclerotic type changes. PT and OT have seen him and recommend outpatient follow up. SLP had no recommendations. His LDL was 46 which is well controlled and his A1C was 5.8 which is also excellent. His blood pressure has been well controlled throughout admission with SBP ranging between 90s and 130s. With his recurrent TIA symptoms and his known carotid artery disease Vascular surgery was consulted to consider CEA. They state that if his stroke is  thought to be secondary to his known carotid disease. Neurology has seen him and states that his strokes could be secondary to bradycardia or his carotid disease and they suggested follow up with Dr. Pearlean Brownie as well as getting cardiology to follow for possible bradycardia treatment.  Dr. Patty Sermons saw him and states that he is currently not a candidate for a pacer and that he should follow up closely with Dr. Myrtis Ser.   We will plan on discharging today on his coumadin with the follow ups needed.    -Continue simvastatin 10mg    - Prophylactic therapy-Anticoagulation: Coumadin-Pharmacy to manage. INR still    supratherapeutic but coming down.   -  Appreciate VVS and neurologies input   2. Paroxysmal A fib with a slow ventricular response: He is followed by Dr. Myrtis Ser at [facility], was recently diagnosed with paroxymal Afib and was started on coumadin. He is followed at the [facility] clinic for his therapy. At presentation he was not in Afib, however his INR was supratherapeutic at 4.55. He reported decreased PO intake two days prior to his admission and his renal function may have declined from baseline, with potential reduced excretion of warfarin. He denies bleeding but has noticed easy bruising. He has had several episodes of bradycardia without any nodal blocking agents on board during this admission. They have all happened while he is asleep and asymptomatic. He was seen by Dr. Patty Sermons and he states that since he is not a candidate for a pacer at this time and he has been asymptomatic that he will need close follow up with Dr. Myrtis Ser.   -Warfarin per pharmacy  3. CAD/carotid artery disease. He has known complete occlusion of the left internal carotid and moderate stenosis of right internal carotid. He is s/p CABG in 2006, although we cannot find records at this time. He is followed by Dr. Myrtis Ser in [facility] cardiology. His last echo was in 2006. Bilateral carotid artery doppler confirms left ICA  occlusion, moderate right ICA occlusion. Direct LDL at 46. Awaiting Neurology's opinion on if his carotid disease is contributing to his TIA symptoms.   4. Recent fall. He reports falling from a ladder (~ 4 feet heigth) one day prior to his admission while working/helping a friend. He continues to deny severe pain, he feels sore with no bruises or hematuria. He has paraspinal musculoskeletal pain today but his total Ck and CKMB are elevated. This could be a component of MSK injury and decreased PO, possibly pre-renal azotemia. Given his supratherapeutic INR and AAA with stent we will continue to monitor H/H and bleeding. UA negative for Hgb. Hemoglobin has been stable.   5. AAA. Pt with hx of abdominal aortic aneurysm with stent in 2010. Pt with no abdominal pain. Will consider abdominal US if worsening back pain, abdominal pain, decreased pedal pulses, hypotension, or drop in H/H.   6. Acute on chronic renal failure (CKD stage II). His baseline creatinine appears to be 1.3-1.5 although it has ranged from 1.7 to 4.19 in the last 2 months. He reports decreased PO intake and increased sweating on the day prior to admission, while taking lisinopril. He appeared euvolemic on initial physical exam, however, with pretibial edema but his urine osmolality and Na, with FeNa of 0.39% suggested pre-renal azothemia. He received IVF with NS of 1.6 L yesterday with IVF of NS@125ml /hr today. Urine output has been adequate at 0.16ml/kg/hr today. Creatinine has trended downward to 1.4 today which is right at his baseline. He was encouraged to avoid nephrotoxic medications such as NSAIDs and to increase his fluid intake.   7. HTN. BP initially low at 101/57 while in ED. Pt reported decreased PO intake and increased sweating 2/2 working outdoors and continued compliance with lisinopril. His BP increased to 138/69 with no fluid boluses. Given his recent TIA/CVA, mild hypertension will be permitted. BP however, has been low today  with asymptomatic bradycardia. He lost his IV last night and we will work to try to get that replaced.   -Will monitor and resume lisinopril 10mg  on discharge  8. GERD. Pt with history of GERD but no complaints on admission and not on medications at home for this  problem. Will continue to monitor.    LOS: 3 days   Dylan Morgan 11/26/2011, 1:02 PM

## 2011-11-27 ENCOUNTER — Telehealth: Payer: Self-pay | Admitting: Vascular Surgery

## 2011-11-27 NOTE — Telephone Encounter (Addendum)
Message copied by Rosalyn Charters on Mon Nov 27, 2011  3:42 PM ------      Message from: Melene Plan      Created: Mon Nov 27, 2011  3:17 PM       He called me.He needs an office visit with Dr Hart Rochester in about 3 weeks for symptomatic right ICA 40-60% stenosis.He is to see Dr Pearlean Brownie also.Per Dr Arbie Cookey does not need any labs or duplex      ----- Message -----         From: Sharee Pimple, CMA         Sent: 11/27/2011   1:53 PM           To: Melene Plan, RN            In his consult note it mentions doing a CT neck for follow up instead of carotid duplex.   Do you want me to add an order or wait for his answer?            ----- Message -----         From: Melene Plan, RN         Sent: 11/27/2011  10:00 AM           To: Larina Earthly, MD, Sharee Pimple, CMA, #            I didn't see your note in EPIC but it looks like on discharge we are suppose to call him for an appt.Will he need any studies done?      Thanks      Darel Hong      ----- Message -----         From: Larina Earthly, MD         Sent: 11/25/2011  10:13 AM           To: Reuel Derby, Melene Plan, RN            Level for hospital consult                  notified patient of fu appt with dr. Hart Rochester on 12-19-11 l/v/m at [phone]

## 2011-12-05 ENCOUNTER — Ambulatory Visit (INDEPENDENT_AMBULATORY_CARE_PROVIDER_SITE_OTHER): Payer: Medicare Other | Admitting: Internal Medicine

## 2011-12-05 ENCOUNTER — Encounter: Payer: Self-pay | Admitting: Internal Medicine

## 2011-12-05 ENCOUNTER — Ambulatory Visit (INDEPENDENT_AMBULATORY_CARE_PROVIDER_SITE_OTHER): Payer: Medicare Other | Admitting: Physician Assistant

## 2011-12-05 ENCOUNTER — Ambulatory Visit (INDEPENDENT_AMBULATORY_CARE_PROVIDER_SITE_OTHER): Payer: Medicare Other | Admitting: Pharmacist

## 2011-12-05 ENCOUNTER — Encounter: Payer: Self-pay | Admitting: Physician Assistant

## 2011-12-05 VITALS — BP 108/80 | HR 64 | Ht 71.0 in | Wt 247.2 lb

## 2011-12-05 VITALS — BP 108/77 | HR 62 | Temp 96.8°F | Ht 71.0 in | Wt 249.5 lb

## 2011-12-05 DIAGNOSIS — I4891 Unspecified atrial fibrillation: Secondary | ICD-10-CM

## 2011-12-05 DIAGNOSIS — I6350 Cerebral infarction due to unspecified occlusion or stenosis of unspecified cerebral artery: Secondary | ICD-10-CM

## 2011-12-05 DIAGNOSIS — I779 Disorder of arteries and arterioles, unspecified: Secondary | ICD-10-CM

## 2011-12-05 DIAGNOSIS — Z7901 Long term (current) use of anticoagulants: Secondary | ICD-10-CM

## 2011-12-05 DIAGNOSIS — N179 Acute kidney failure, unspecified: Secondary | ICD-10-CM

## 2011-12-05 DIAGNOSIS — I480 Paroxysmal atrial fibrillation: Secondary | ICD-10-CM

## 2011-12-05 DIAGNOSIS — R001 Bradycardia, unspecified: Secondary | ICD-10-CM

## 2011-12-05 DIAGNOSIS — I129 Hypertensive chronic kidney disease with stage 1 through stage 4 chronic kidney disease, or unspecified chronic kidney disease: Secondary | ICD-10-CM

## 2011-12-05 DIAGNOSIS — I10 Essential (primary) hypertension: Secondary | ICD-10-CM

## 2011-12-05 DIAGNOSIS — I639 Cerebral infarction, unspecified: Secondary | ICD-10-CM

## 2011-12-05 DIAGNOSIS — I2510 Atherosclerotic heart disease of native coronary artery without angina pectoris: Secondary | ICD-10-CM

## 2011-12-05 DIAGNOSIS — R7309 Other abnormal glucose: Secondary | ICD-10-CM

## 2011-12-05 DIAGNOSIS — G459 Transient cerebral ischemic attack, unspecified: Secondary | ICD-10-CM

## 2011-12-05 DIAGNOSIS — E785 Hyperlipidemia, unspecified: Secondary | ICD-10-CM

## 2011-12-05 DIAGNOSIS — N182 Chronic kidney disease, stage 2 (mild): Secondary | ICD-10-CM

## 2011-12-05 DIAGNOSIS — R7303 Prediabetes: Secondary | ICD-10-CM

## 2011-12-05 DIAGNOSIS — I498 Other specified cardiac arrhythmias: Secondary | ICD-10-CM

## 2011-12-05 LAB — POCT INR: INR: 2.7

## 2011-12-05 LAB — BASIC METABOLIC PANEL
Chloride: 104 mEq/L (ref 96–112)
Potassium: 4.6 mEq/L (ref 3.5–5.3)
Sodium: 138 mEq/L (ref 135–145)

## 2011-12-05 NOTE — Patient Instructions (Addendum)
Your physician recommends that you schedule a follow-up appointment WITH DR. KATZ IN 6-8 WEEKS    Your physician recommends that you continue on your current medications as directed. Please refer to the Current Medication list given to you today.

## 2011-12-05 NOTE — Progress Notes (Signed)
[address] [place], [place]  [address] Phone: [phone] Fax:  [phone]  Date:  12/05/2011   Name:  Dylan Morgan   DOB:  10-11-1945   MRN:  [id number]  PCP:  Denton Ar, MD  Primary Cardiologist:  Dr. Zackery Barefoot  Primary Electrophysiologist:  None    History of Present Illness: Dylan Morgan is a 66 y.o. male who returns for post hospital follow up.  He has a h/o CAD, status post CABG in 2004, AAA, status post endovascular stenting 02/2009, parox atrial fibrillation, prior stroke in 01/2011 (right perirolandic subcortical) treated with tPA and right corona radiota stroke 08/2011, renal insufficiency. He has a known occluded left internal carotid artery.   Last nuclear study 01/2009: No ischemia, EF 71%. Event monitor in 06/2011:  AFib vs Flutter with CVR and coumadin was initiated. Dopplers 8/13:  LICA occluded, RICA 40-59%.    Last echo 11/2011: Moderate LVH, EF 65-70%, normal diastolic fxn.     He was admitted 8/22-8/25 with symptoms consistent with a transient ischemic attack. Echo demonstrated normal LV function. Carotid Dopplers demonstrated continued occluded LICA and mild to moderate disease in the RICA. CT was negative for acute bleed. MRI demonstrated no acute infarct. He was seen by vascular surgery. Dr. Arbie Cookey recommended formal neurologic evaluation. If no other cause for his TIA was found, he suggested that the patient undergo carotid endarterectomy. Outpatient followup is planned with Dr. Pearlean Brownie.  Cardiology saw the patient (Dr. Patty Sermons) due to profound bradycardia. There was no indication for pacemaker.  Today he is doing well. He denies chest pain, shortness of breath, syncope, orthopnea, PND or edema. He denies any symptoms recurrent TIAs.   Past Medical History  Diagnosis Date  . CAD (coronary artery disease) CABG in 2004    F/U by Dr Genia Harold cardiology, cardiolite scan done elsewhere in October 2010 that was normal, no ischemia  seen, EF 55-65% in 02/2005  . GERD (gastroesophageal reflux disease)   . Hypertension   . Hyperlipidemia   . Renal insufficiency   . Cellulitis and abscess of leg     right leg  . Tobacco abuse   . Chronic back pain   . Chronic neck pain   . Seborrheic dermatitis of scalp   . Abnormal LFTs     Hx of abnormal LFT's  . Bradycardia   . AAA (abdominal aortic aneurysm)     November 2010, Stenting, November, 2010  . Paroxysmal atrial fibrillation 02/22/2009    diltiazem po started but discontinued due to side effects.  . Stroke   . Ejection fraction     EF 40% in the past  /   EF 55-65% echo, November, 2006  . Hx of CABG     2004  . Carotid artery disease     Doppler, October, 2012, and old LICA occlusion , RICA no significant abnormality  . Arthritis   . Prediabetes   . CVA (cerebral infarction)     R Carona radiata stroke 08/16/2011    Current Outpatient Prescriptions  Medication Sig Dispense Refill  . lisinopril (PRINIVIL,ZESTRIL) 10 MG tablet Take 1 tablet (10 mg total) by mouth daily.  60 tablet  3  . Multiple Vitamins-Minerals (MULTIVITAMIN WITH MINERALS) tablet Take 1 tablet by mouth daily.      . sildenafil (VIAGRA) 100 MG tablet Take 100 mg by mouth daily as needed. For erectile dysfunction       . simvastatin (ZOCOR) 10 MG  tablet Take 10 mg by mouth at bedtime.      Marland Kitchen warfarin (COUMADIN) 5 MG tablet Take 1 tablet (5 mg total) by mouth daily. 90 day supply  105 tablet  1    Allergies: Allergies  Allergen Reactions  . Diltiazem Hcl Other (See Comments)    unknown  . Oxycodone-Acetaminophen Other (See Comments)    unknown    History  Substance Use Topics  . Smoking status: Former Smoker    Types: Cigarettes    Quit date: 02/03/2010  . Smokeless tobacco: Never Used  . Alcohol Use: No      PHYSICAL EXAM: VS:  BP 108/80  Pulse 64  Ht 5\' 11"  (1.803 m)  Wt 247 lb 3.2 oz (112.129 kg)  BMI 34.48 kg/m2 Well nourished, well developed, in no acute  distress HEENT: normal Neck: no JVD Cardiac:  normal S1, S2; RRR; no murmur Lungs:  clear to auscultation bilaterally, no wheezing, rhonchi or rales Abd: soft, nontender, no hepatomegaly Ext: no edema Skin: warm and dry Neuro:  CNs 2-12 intact, no focal abnormalities noted  EKG:  Sinus rhythm, heart rate 64, leftward axis, low voltage, PVCs, nonspecific ST-T wave changes, no change from prior tracing       ASSESSMENT AND PLAN:  1.  Bradycardia: Heart rate is in the 60s today. There has been no clear indication for pacemaker in the past. He is not on any rate controlling medications. No change in his therapy at this point in time. Plan followup with Dr. Myrtis Ser in 6-8 weeks.  2. Carotid Artery Stenosis:  He has had a history of recurrent stroke as well as recent TIA. He has followup pending with neurology as well as vascular surgery to determine whether or not he needs right carotid endarterectomy.  3. Atrial Fibrillation: Maintaining sinus rhythm. Continue Coumadin therapy.  4. Coronary Artery Disease: No angina. Continue statin therapy.  5. Chronic Kidney Disease: Basic metabolic panel obtained by his PCP earlier today.  6. Hypertension: Controlled.  Luna Glasgow, PA-C  11:27 AM 12/05/2011

## 2011-12-05 NOTE — Progress Notes (Signed)
Subjective:   Patient ID: Dylan Morgan male   DOB: 07/18/1945 66 y.o.   MRN: [id number]  HPI: Dylan Morgan is a 66 y.o. man who is here for hospital follow up.  He was hospitalized in the end of August because of a TIA.  He has a previous history of CVA as well as a recent history of several TIAs.  He was seen by Neurology in the hospital and they were asked to comment on if they thought his TIAs were coming from symptomatic coronary artery disease.  He is scheduled to follow up with Dr. Pearlean Brownie as an outpatient and has a follow up with Vascular surgery (Dr. Hart Rochester) on 12/19/11 to assess the need for endarterectomy on the right side.  He states that since his hospitalization he has been feeling well.  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  He states that he has been drinking more water.  He has been using Mios as an additive to help with the taste and he states that helps him drink 8-9 glasses of water per day.  During his hospitalization he was noted to be in acute renal failure that was likely secondary to pre-renal azotemia with his history of working outside in the hot weather and little fluid intake on those days.  He states that he has had no problems with urination and states that he thinks he is urinating normally.  Also during his hospitalization he had several episodes of profound bradycardia with rates in the 30s and 40s.  He was recently diagnosed by Dr. Myrtis Ser of [facility] cardiology with paroxysmal atrial fibrillation with a slow ventricular response.  He has been on coumadin because of this which is followed by Dr. Myrtis Ser.  He was seen by Dr. Patty Sermons during his hospitalization who suggested outpatient follow up as he was not currently a candidate for a pacemaker since he was asymptomatic from his bradycardia.  He has a follow up appointment with [facility] today at 3:15 pm. He denies palpitations, syncope, DOE, or chest pain.    During the work up for his TIA his A1C was noted to be 5.8.  He has been 6.0 and 5.9 in the past as far back as a year ago.  He denies any polyuria, polydipsia, or blurry vision.  He has a history of diabetes in his mother.  His weight has been stable for the last few years but he follows no particular diet at home.   Past Medical History  Diagnosis Date  . CAD (coronary artery disease) CABG in 2004    F/U by Dr Genia Harold cardiology, cardiolite scan done elsewhere in October 2010 that was normal, no ischemia seen, EF 55-65% in 02/2005  . GERD (gastroesophageal reflux disease)   . Hypertension   . Hyperlipidemia   . Renal insufficiency   . Cellulitis and abscess of leg     right leg  . Tobacco abuse   . Chronic back pain   . Chronic neck pain   . Seborrheic dermatitis of scalp   . Abnormal LFTs     Hx of abnormal LFT's  . Bradycardia   . AAA (abdominal aortic aneurysm)     November 2010, Stenting, November, 2010  . Paroxysmal atrial fibrillation 02/22/2009    diltiazem po started but discontinued due to side effects.  . Stroke   . Ejection fraction     EF 40% in the past  /  EF 55-65% echo, November, 2006  . Hx of CABG     2004  . Carotid artery disease     Doppler, October, 2012, and old LICA occlusion , RICA no significant abnormality  . Arthritis   . Prediabetes   . CVA (cerebral infarction)     R Carona radiata stroke 08/16/2011   Current Outpatient Prescriptions  Medication Sig Dispense Refill  . lisinopril (PRINIVIL,ZESTRIL) 10 MG tablet Take 1 tablet (10 mg total) by mouth daily.  60 tablet  3  . Multiple Vitamins-Minerals (MULTIVITAMIN WITH MINERALS) tablet Take 1 tablet by mouth daily.      . sildenafil (VIAGRA) 100 MG tablet Take 100 mg by mouth daily as needed. For erectile dysfunction       . simvastatin (ZOCOR) 10 MG tablet Take 10 mg by mouth at bedtime.      Marland Kitchen warfarin (COUMADIN) 5 MG tablet Take 1 tablet (5 mg total) by mouth daily. 90 day supply  105  tablet  1   Family History  Problem Relation Age of Onset  . Diabetes Mother   . Stroke Father   . Cancer Sister     thyroid cancer  . Heart disease Brother     Coronary artery disease   History   Social History  . Marital Status: Single    Spouse Name: N/A    Number of Children: N/A  . Years of Education: N/A   Occupational History  . disabled    Social History Main Topics  . Smoking status: Former Smoker    Types: Cigarettes    Quit date: 02/03/2010  . Smokeless tobacco: Never Used  . Alcohol Use: No  . Drug Use: No  . Sexually Active: No   Other Topics Concern  . None   Social History Narrative   Lives with son and his significant other, denies being married. Has 3 daughters & a son.  Several grandchildren. Divorced.Regular exercise.Prior to retirement, hung dry wall for a living.    Review of Systems: Constitutional: Denies fever, chills, diaphoresis, appetite change and fatigue.  HEENT: Denies photophobia, eye pain, redness, hearing loss, ear pain, congestion, sore throat, rhinorrhea, sneezing, mouth sores, trouble swallowing, neck pain, neck stiffness and tinnitus.   Respiratory: Denies SOB, DOE, cough, chest tightness,  and wheezing.   Cardiovascular: Denies chest pain, palpitations and leg swelling.  Gastrointestinal: Denies nausea, vomiting, abdominal pain, diarrhea, constipation, blood in stool and abdominal distention.  Genitourinary: Denies dysuria, urgency, frequency, hematuria, flank pain and difficulty urinating.  Musculoskeletal: Denies myalgias, back pain, joint swelling, arthralgias and gait problem.  Skin: Denies pallor, rash and wound.  Neurological: Denies dizziness, seizures, syncope, weakness, light-headedness, numbness and headaches.  Hematological: Denies adenopathy. Easy bruising, personal or family bleeding history  Psychiatric/Behavioral: Denies suicidal ideation, mood changes, confusion, nervousness, sleep disturbance and  agitation  Objective:  Physical Exam: Filed Vitals:   12/05/11 0915  BP: 108/77  Pulse: 62  Temp: 96.8 F (36 C)  TempSrc: Oral  Height: 5\' 11"  (1.803 m)  Weight: 249 lb 8 oz (113.172 kg)  SpO2: 96%   Constitutional: Vital signs reviewed.  Patient is a well-developed and well-nourished morbidly obese man in no acute distress and cooperative with exam. Alert and oriented x3.  Head: Normocephalic and atraumatic Ear: TM normal bilaterally Mouth: no erythema or exudates, MMM Eyes: PERRL, EOMI, conjunctivae normal, No scleral icterus.  Neck: Supple, Trachea midline normal ROM, No JVD, mass, thyromegaly, or carotid bruit present.  Cardiovascular: RRR, S1  normal, S2 normal, no MRG, pulses symmetric and intact bilaterally Pulmonary/Chest: CTAB, no wheezes, rales, or rhonchi Abdominal: Soft. Non-tender, non-distended, bowel sounds are normal, no masses, organomegaly, or guarding present.  GU: no CVA tenderness Musculoskeletal: No joint deformities, erythema, or stiffness, ROM full and no nontender Hematology: no cervical, inginal, or axillary adenopathy.  Neurological: A&O x3, Strength is normal and symmetric bilaterally, cranial nerve II-XII are grossly intact, no focal motor deficit, sensory intact to light touch bilaterally.  Skin: Warm, dry and intact. No rash, cyanosis, or clubbing.  Psychiatric: Normal mood and affect. speech and behavior is normal. Judgment and thought content normal. Cognition and memory are normal.   Assessment & Plan:

## 2011-12-05 NOTE — Patient Instructions (Addendum)
1.  Stop in the lab to have your blood drawn.  If we need to talk about anything I will call you.  2.  Continue all of your other medications as prescribed  3.  Make sure you keep your follow up with Dr. Pearlean Brownie and Dr. Hart Rochester on the 17th.  4.  Follow up in about 3 months to see how you are doing.   Diabetes Meal Planning Guide The diabetes meal planning guide is a tool to help you plan your meals and snacks. It is important for people with diabetes to manage their blood glucose (sugar) levels. Choosing the right foods and the right amounts throughout your day will help control your blood glucose. Eating right can even help you improve your blood pressure and reach or maintain a healthy weight. CARBOHYDRATE COUNTING MADE EASY When you eat carbohydrates, they turn to sugar. This raises your blood glucose level. Counting carbohydrates can help you control this level so you feel better. When you plan your meals by counting carbohydrates, you can have more flexibility in what you eat and balance your medicine with your food intake. Carbohydrate counting simply means adding up the total amount of carbohydrate grams in your meals and snacks. Try to eat about the same amount at each meal. Foods with carbohydrates are listed below. Each portion below is 1 carbohydrate serving or 15 grams of carbohydrates. Ask your dietician how many grams of carbohydrates you should eat at each meal or snack. Grains and Starches  1 slice bread.    English muffin or hotdog/hamburger bun.    cup cold cereal (unsweetened).   ? cup cooked pasta or rice.    cup starchy vegetables (corn, potatoes, peas, beans, winter squash).   1 tortilla (6 inches).    bagel.   1 waffle or pancake (size of a CD).    cup cooked cereal.   4 to 6 small crackers.  *Whole grain is recommended. Fruit  1 cup fresh unsweetened berries, melon, papaya, pineapple.   1 small fresh fruit.    banana or mango.    cup fruit juice  (4 oz unsweetened).    cup canned fruit in natural juice or water.   2 tbs dried fruit.   12 to 15 grapes or cherries.  Milk and Yogurt  1 cup fat-free or 1% milk.   1 cup soy milk.   6 oz light yogurt with sugar-free sweetener.   6 oz low-fat soy yogurt.   6 oz plain yogurt.  Vegetables  1 cup raw or  cup cooked is counted as 0 carbohydrates or a "free" food.   If you eat 3 or more servings at 1 meal, count them as 1 carbohydrate serving.  Other Carbohydrates   oz chips or pretzels.    cup ice cream or frozen yogurt.    cup sherbet or sorbet.   2 inch square cake, no frosting.   1 tbs honey, sugar, jam, jelly, or syrup.   2 small cookies.   3 squares of graham crackers.   3 cups popcorn.   6 crackers.   1 cup broth-based soup.   Count 1 cup casserole or other mixed foods as 2 carbohydrate servings.   Foods with less than 20 calories in a serving may be counted as 0 carbohydrates or a "free" food.  You may want to purchase a book or computer software that lists the carbohydrate gram counts of different foods. In addition, the nutrition facts panel on the  labels of the foods you eat are a good source of this information. The label will tell you how big the serving size is and the total number of carbohydrate grams you will be eating per serving. Divide this number by 15 to obtain the number of carbohydrate servings in a portion. Remember, 1 carbohydrate serving equals 15 grams of carbohydrate. SERVING SIZES Measuring foods and serving sizes helps you make sure you are getting the right amount of food. The list below tells how big or small some common serving sizes are.  1 oz.........4 stacked dice.   3 oz........Marland KitchenDeck of cards.   1 tsp.......Marland KitchenTip of little finger.   1 tbs......Marland KitchenMarland KitchenThumb.   2 tbs.......Marland KitchenGolf ball.    cup......Marland KitchenHalf of a fist.   1 cup.......Marland KitchenA fist.  SAMPLE DIABETES MEAL PLAN Below is a sample meal plan that includes foods from the  grain and starches, dairy, vegetable, fruit, and meat groups. A dietician can individualize a meal plan to fit your calorie needs and tell you the number of servings needed from each food group. However, controlling the total amount of carbohydrates in your meal or snack is more important than making sure you include all of the food groups at every meal. You may interchange carbohydrate containing foods (dairy, starches, and fruits). The meal plan below is an example of a 2000 calorie diet using carbohydrate counting. This meal plan has 17 carbohydrate servings. Breakfast  1 cup oatmeal (2 carb servings).    cup light yogurt (1 carb serving).   1 cup blueberries (1 carb serving).    cup almonds.  Snack  1 large apple (2 carb servings).   1 low-fat string cheese stick.  Lunch  Chicken breast salad.   1 cup spinach.    cup chopped tomatoes.   2 oz chicken breast, sliced.   2 tbs low-fat [place] dressing.   12 whole-wheat crackers (2 carb servings).   12 to 15 grapes (1 carb serving).   1 cup low-fat milk (1 carb serving).  Snack  1 cup carrots.    cup hummus (1 carb serving).  Dinner  3 oz broiled salmon.   1 cup Yagi rice (3 carb servings).  Snack  1  cups steamed broccoli (1 carb serving) drizzled with 1 tsp olive oil and lemon juice.   1 cup light pudding (2 carb servings).  DIABETES MEAL PLANNING WORKSHEET Your dietician can use this worksheet to help you decide how many servings of foods and what types of foods are right for you.  BREAKFAST Food Group and Servings / Carb Servings Grain/Starches __________________________________ Dairy __________________________________________ Vegetable ______________________________________ Fruit ___________________________________________ Meat __________________________________________ Fat ____________________________________________ LUNCH Food Group and Servings / Carb Servings Grain/Starches  ___________________________________ Dairy ___________________________________________ Fruit ____________________________________________ Meat ___________________________________________ Fat _____________________________________________ Laural Golden Food Group and Servings / Carb Servings Grain/Starches ___________________________________ Dairy ___________________________________________ Fruit ____________________________________________ Meat ___________________________________________ Fat _____________________________________________ SNACKS Food Group and Servings / Carb Servings Grain/Starches ___________________________________ Dairy ___________________________________________ Vegetable _______________________________________ Fruit ____________________________________________ Meat ___________________________________________ Fat _____________________________________________ DAILY TOTALS Starches _________________________ Vegetable ________________________ Fruit ____________________________ Dairy ____________________________ Meat ____________________________ Fat ______________________________ Document Released: 12/15/2004 Document Revised: 03/09/2011 Document Reviewed: 10/26/2008 Exit[employer] Patient Information 2012 [place], [place].

## 2011-12-15 ENCOUNTER — Ambulatory Visit (INDEPENDENT_AMBULATORY_CARE_PROVIDER_SITE_OTHER): Payer: Medicare Other | Admitting: Pharmacist

## 2011-12-15 DIAGNOSIS — I480 Paroxysmal atrial fibrillation: Secondary | ICD-10-CM

## 2011-12-15 DIAGNOSIS — I6350 Cerebral infarction due to unspecified occlusion or stenosis of unspecified cerebral artery: Secondary | ICD-10-CM

## 2011-12-15 DIAGNOSIS — I4891 Unspecified atrial fibrillation: Secondary | ICD-10-CM

## 2011-12-15 DIAGNOSIS — Z7901 Long term (current) use of anticoagulants: Secondary | ICD-10-CM

## 2011-12-15 DIAGNOSIS — I639 Cerebral infarction, unspecified: Secondary | ICD-10-CM

## 2011-12-15 LAB — POCT INR: INR: 2.8

## 2011-12-18 ENCOUNTER — Encounter: Payer: Self-pay | Admitting: Vascular Surgery

## 2011-12-19 ENCOUNTER — Other Ambulatory Visit: Payer: Self-pay

## 2011-12-19 ENCOUNTER — Other Ambulatory Visit: Payer: Self-pay | Admitting: *Deleted

## 2011-12-19 ENCOUNTER — Encounter: Payer: Self-pay | Admitting: Vascular Surgery

## 2011-12-19 ENCOUNTER — Ambulatory Visit (INDEPENDENT_AMBULATORY_CARE_PROVIDER_SITE_OTHER): Payer: Medicare Other | Admitting: Vascular Surgery

## 2011-12-19 VITALS — BP 108/78 | HR 61 | Resp 18 | Ht 71.0 in | Wt 246.6 lb

## 2011-12-19 DIAGNOSIS — I6529 Occlusion and stenosis of unspecified carotid artery: Secondary | ICD-10-CM

## 2011-12-19 DIAGNOSIS — G459 Transient cerebral ischemic attack, unspecified: Secondary | ICD-10-CM

## 2011-12-19 NOTE — Progress Notes (Addendum)
[facility] CONSULT NOTE 12/19/2011 DOB: [id number] MRN : [id number]  CC:CVA with carotid stenosis right and left carotid occlusion. Referring Physician:Dr. Collier Bullock  History of Present Illness: This is a 66 year old male who has had three TIA events with symptoms of left sided weakness and numbness that has resolved.  The last hospitalization was 11-23-2011.   His PMH is significant for CAD s/p CABG in 2004, multiple CVAs with on neurologic deficits, CKD stage II, HTN, OSA not on CPAP, and Paroxysmal Afib. His home dose of coumadin is 5 mg daily and his last dose was yesterday, 8/21. His INRs on admission are 3.41 and 3.7. His H/H is WNL but his platelets are on the low end of normal and have been low in the past. His admission INR is > than the 2-3 goal. No bleeding reported.   Past Medical History  Diagnosis Date  . CAD (coronary artery disease) CABG in 2004    F/U by Dr Genia Harold cardiology, cardiolite scan done elsewhere in October 2010 that was normal, no ischemia seen, EF 55-65% in 02/2005  . GERD (gastroesophageal reflux disease)   . Hypertension   . Hyperlipidemia   . Renal insufficiency   . Cellulitis and abscess of leg     right leg  . Tobacco abuse   . Chronic back pain   . Chronic neck pain   . Seborrheic dermatitis of scalp   . Abnormal LFTs     Hx of abnormal LFT's  . Bradycardia   . AAA (abdominal aortic aneurysm)     November 2010, Stenting, November, 2010  . Paroxysmal atrial fibrillation 02/22/2009    diltiazem po started but discontinued due to side effects.  . Ejection fraction     EF 40% in the past  /   EF 55-65% echo, November, 2006  . Hx of CABG     2004  . Carotid artery disease     Doppler, October, 2012, and old LICA occlusion , RICA no significant abnormality  . Arthritis   . Prediabetes   . CVA (cerebral infarction)     R Carona radiata stroke 08/16/2011  . Stroke 11/26/11    Past Surgical History  Procedure Date  .  Coronary artery bypass graft 2004  . Rotator cuff repair      ROS: [x]  Positive  [ ]  Denies    General: [ ]  Weight loss, [ ]  Fever, [ ]  chills Neurologic: [ ]  Dizziness, [ ]  Blackouts, [ ]  Seizure [ ]  Stroke, [x ] "Mini stroke", [ ]  Slurred speech, [ ]  Temporary blindness; [ ]  weakness in arms or legs, [ ]  Hoarseness Cardiac: [ ]  Chest pain/pressure, [ ]  Shortness of breath at rest [ ]  Shortness of breath with exertion, [ ]  Atrial fibrillation or irregular heartbeat Vascular: [ ]  Pain in legs with walking, [ ]  Pain in legs at rest, [ ]  Pain in legs at night,  [ ]  Non-healing ulcer, [ ]  Blood clot in vein/DVT,   Pulmonary: [ ]  Home oxygen, [ ]  Productive cough, [ ]  Coughing up blood, [ ]  Asthma,  [ ]  Wheezing Musculoskeletal:  [ ]  Arthritis, [ ]  Low back pain, [ ]  Joint pain Hematologic: [ ]  Easy Bruising, [ ]  Anemia; [ ]  Hepatitis Gastrointestinal: [ ]  Blood in stool, [ ]  Gastroesophageal Reflux/heartburn, [ ]  Trouble swallowing Urinary: [ ]  chronic Kidney disease, [ ]  on HD - [ ]  MWF or [ ]  TTHS, [ ]   Burning with urination, [ ]  Difficulty urinating Skin: [ ]  Rashes, [ ]  Wounds Psychological: [ ]  Anxiety, [ ]  Depression  Social History History  Substance Use Topics  . Smoking status: Former Smoker    Types: Cigarettes    Quit date: 02/03/2010  . Smokeless tobacco: Never Used  . Alcohol Use: No    Family History Family History  Problem Relation Age of Onset  . Diabetes Mother   . Stroke Father   . Cancer Sister     thyroid cancer  . Heart disease Brother     Coronary artery disease    Allergies  Allergen Reactions  . Diltiazem Hcl Other (See Comments)    unknown  . Oxycodone-Acetaminophen Other (See Comments)    unknown    Current Outpatient Prescriptions  Medication Sig Dispense Refill  . lisinopril (PRINIVIL,ZESTRIL) 10 MG tablet Take 1 tablet (10 mg total) by mouth daily.  60 tablet  3  . Multiple Vitamins-Minerals (MULTIVITAMIN WITH MINERALS) tablet Take 1  tablet by mouth daily.      . sildenafil (VIAGRA) 100 MG tablet Take 100 mg by mouth daily as needed. For erectile dysfunction       . simvastatin (ZOCOR) 10 MG tablet Take 10 mg by mouth at bedtime.      Marland Kitchen warfarin (COUMADIN) 5 MG tablet Take 1 tablet (5 mg total) by mouth daily. 90 day supply  105 tablet  1     Imaging: No results found.  Significant Diagnostic Studies: CBC Lab Results  Component Value Date   WBC 3.5* 11/25/2011   HGB 12.6* 11/25/2011   HCT 37.0* 11/25/2011   MCV 90.2 11/25/2011   PLT 111* 11/25/2011    BMET    Component Value Date/Time   NA 138 12/05/2011 0957   K 4.6 12/05/2011 0957   CL 104 12/05/2011 0957   CO2 26 12/05/2011 0957   GLUCOSE 104* 12/05/2011 0957   BUN 33* 12/05/2011 0957   CREATININE 1.40* 12/05/2011 0957   CREATININE 1.25 11/26/2011 0805   CALCIUM 9.7 12/05/2011 0957   GFRNONAA 57* 11/26/2011 0805   GFRAA 67* 11/26/2011 0805    COAG Lab Results  Component Value Date   INR 2.8 12/15/2011   INR 2.7 12/05/2011   INR 2.82* 11/26/2011   No results found for this basename: PTT     Physical Examination BP Readings from Last 3 Encounters:  12/19/11 108/78  12/05/11 108/80  12/05/11 108/77   Temp Readings from Last 3 Encounters:  12/05/11 96.8 F (36 C) Oral  11/26/11 97.6 F (36.4 C)   09/20/11 96.6 F (35.9 C) Oral   SpO2 Readings from Last 3 Encounters:  12/19/11 98%  12/05/11 96%  11/26/11 100%   Pulse Readings from Last 3 Encounters:  12/19/11 61  12/05/11 64  12/05/11 62    General:  WDWN in NAD Gait: Normal HENT: WNL,  Eyes: Pupils equal, loss of central vision left eye due to injury  Pulmonary: normal non-labored breathing , without Rales, rhonchi,  wheezing Cardiac: RRR, without  Murmurs, rubs or gallops; No carotid bruits Abdomen: soft, NT, no masses Skin: no rashes, ulcers noted Vascular Exam/Pulses:Palpable radial pulses equal.  DP/PT palpable equal bilateral  Extremities without ischemic changes, no Gangrene , no  cellulitis; no open wounds;  Musculoskeletal: no muscle wasting or atrophy  Neurologic: A&O X 3; Appropriate Affect ;  SENSATION: normal; MOTOR FUNCTION: Pt has good and equal strength in all extremities - 5/5 Speech is fluent/normal  Non-Invasive Vascular Imaging: Carotid ultrasound Summary:  - Findings consistent with 40-59% stenosis involving the right internal carotid artery. Velocities may be due to compensatory flow. - Findings consistent withocclusion of the left internal carotid artery. - Right vertebral artery waveform suggest proximal stenosis. Left vertebral artery flow is antegrade. Other specific details can be found in the table(s) above. Prepared and Electronically Authenticated by     ASSESSMENT/PLAN: Left carotid occlusion, right 40-50% stenosis with history of TIA events times three. We will order a CTA ASAP and have him follow up in 1-2 weeks. Continue current medication to include coumadin as directed. The patient was seen in conjunction with Dr. Hart Rochester today.  This gentleman is definitely have symptoms of TIA, so we will order the above CTA to help decide if surgery will help him to prevent further symptoms of stroke.

## 2011-12-20 ENCOUNTER — Other Ambulatory Visit: Payer: Self-pay | Admitting: *Deleted

## 2011-12-20 DIAGNOSIS — I6529 Occlusion and stenosis of unspecified carotid artery: Secondary | ICD-10-CM

## 2011-12-20 DIAGNOSIS — Z01810 Encounter for preprocedural cardiovascular examination: Secondary | ICD-10-CM

## 2011-12-21 ENCOUNTER — Other Ambulatory Visit: Payer: Medicare Other

## 2011-12-21 ENCOUNTER — Ambulatory Visit
Admission: RE | Admit: 2011-12-21 | Discharge: 2011-12-21 | Disposition: A | Discharge status: Home / Self Care | Payer: Medicare Other | Source: Ambulatory Visit | Attending: Vascular Surgery | Admitting: Vascular Surgery

## 2011-12-21 DIAGNOSIS — I6529 Occlusion and stenosis of unspecified carotid artery: Secondary | ICD-10-CM

## 2011-12-21 DIAGNOSIS — Z01810 Encounter for preprocedural cardiovascular examination: Secondary | ICD-10-CM

## 2011-12-21 MED ORDER — IOHEXOL 350 MG/ML SOLN
100.0000 mL | Freq: Once | INTRAVENOUS | Status: AC | PRN
  Administered 2011-12-21: 100 mL via INTRAVENOUS

## 2011-12-29 ENCOUNTER — Ambulatory Visit (INDEPENDENT_AMBULATORY_CARE_PROVIDER_SITE_OTHER): Payer: Medicare Other

## 2011-12-29 DIAGNOSIS — I480 Paroxysmal atrial fibrillation: Secondary | ICD-10-CM

## 2011-12-29 DIAGNOSIS — I4891 Unspecified atrial fibrillation: Secondary | ICD-10-CM

## 2011-12-29 DIAGNOSIS — Z7901 Long term (current) use of anticoagulants: Secondary | ICD-10-CM

## 2011-12-29 DIAGNOSIS — I6350 Cerebral infarction due to unspecified occlusion or stenosis of unspecified cerebral artery: Secondary | ICD-10-CM

## 2011-12-29 DIAGNOSIS — I639 Cerebral infarction, unspecified: Secondary | ICD-10-CM

## 2011-12-29 LAB — POCT INR: INR: 2.9

## 2012-01-01 ENCOUNTER — Encounter: Payer: Self-pay | Admitting: Vascular Surgery

## 2012-01-02 ENCOUNTER — Encounter: Payer: Self-pay | Admitting: Cardiology

## 2012-01-02 ENCOUNTER — Other Ambulatory Visit: Payer: Medicare Other

## 2012-01-02 ENCOUNTER — Encounter: Payer: Self-pay | Admitting: Vascular Surgery

## 2012-01-02 ENCOUNTER — Ambulatory Visit (INDEPENDENT_AMBULATORY_CARE_PROVIDER_SITE_OTHER): Payer: Medicare Other | Admitting: Vascular Surgery

## 2012-01-02 VITALS — BP 137/77 | HR 69 | Resp 20 | Ht 71.0 in | Wt 248.0 lb

## 2012-01-02 DIAGNOSIS — Z8673 Personal history of transient ischemic attack (TIA), and cerebral infarction without residual deficits: Secondary | ICD-10-CM

## 2012-01-02 DIAGNOSIS — Z48812 Encounter for surgical aftercare following surgery on the circulatory system: Secondary | ICD-10-CM

## 2012-01-02 DIAGNOSIS — I714 Abdominal aortic aneurysm, without rupture, unspecified: Secondary | ICD-10-CM | POA: Insufficient documentation

## 2012-01-02 DIAGNOSIS — I6529 Occlusion and stenosis of unspecified carotid artery: Secondary | ICD-10-CM

## 2012-01-02 NOTE — Addendum Note (Signed)
Addended by: Sharee Pimple on: 01/02/2012 02:39 PM   Modules accepted: Orders

## 2012-01-02 NOTE — Progress Notes (Signed)
Subjective:     Patient ID: Dylan Morgan, male   DOB: 1945-12-05, 66 y.o.   MRN: [id number]  HPI this 66 year old male returns today for further followup regarding his carotid occlusive disease. He has a known left ICA occlusion. He had suggestion of a 50% right ICA stenosis with a recent TIA a few months ago. He denies any symptoms in the past 4-6 weeks including lateralizing weakness, aphasia, amaurosis fugax, or dizziness. He does have unsteady gait on occasion. He had a CT angiogram of the neck performed on 12/21/2011 which I will review by computer today.  Patient also had aortic stent graft repair performed in 2010 and is not returned for followup since 20 11.  Past Medical History  Diagnosis Date  . CAD (coronary artery disease) CABG in 2004    F/U by Dr Genia Harold cardiology, cardiolite scan done elsewhere in October 2010 that was normal, no ischemia seen, EF 55-65% in 02/2005  . GERD (gastroesophageal reflux disease)   . Hypertension   . Hyperlipidemia   . Renal insufficiency   . Cellulitis and abscess of leg     right leg  . Tobacco abuse   . Chronic back pain   . Chronic neck pain   . Seborrheic dermatitis of scalp   . Abnormal LFTs     Hx of abnormal LFT's  . Bradycardia   . AAA (abdominal aortic aneurysm)     November 2010, Stenting, November, 2010  . Paroxysmal atrial fibrillation 02/22/2009    diltiazem po started but discontinued due to side effects.  . Ejection fraction     EF 40% in the past  /   EF 55-65% echo, November, 2006  . Hx of CABG     2004  . Carotid artery disease     Doppler, October, 2012, and old LICA occlusion , RICA no significant abnormality  . Arthritis   . Prediabetes   . CVA (cerebral infarction)     R Carona radiata stroke 08/16/2011  . Stroke 11/26/11    History  Substance Use Topics  . Smoking status: Former Smoker    Types: Cigarettes    Quit date: 02/03/2010  . Smokeless tobacco: Never Used  . Alcohol Use: No    Family  History  Problem Relation Age of Onset  . Diabetes Mother   . Stroke Father   . Cancer Sister     thyroid cancer  . Heart disease Brother     Coronary artery disease    Allergies  Allergen Reactions  . Diltiazem Hcl Other (See Comments)    unknown  . Oxycodone-Acetaminophen Other (See Comments)    unknown    Current outpatient prescriptions:lisinopril (PRINIVIL,ZESTRIL) 10 MG tablet, Take 1 tablet (10 mg total) by mouth daily., Disp: 60 tablet, Rfl: 3;  Multiple Vitamins-Minerals (MULTIVITAMIN WITH MINERALS) tablet, Take 1 tablet by mouth daily., Disp: , Rfl: ;  sildenafil (VIAGRA) 100 MG tablet, Take 100 mg by mouth daily as needed. For erectile dysfunction , Disp: , Rfl:  simvastatin (ZOCOR) 10 MG tablet, Take 10 mg by mouth at bedtime., Disp: , Rfl: ;  warfarin (COUMADIN) 5 MG tablet, Take 1 tablet (5 mg total) by mouth daily. 90 day supply, Disp: 105 tablet, Rfl: 1  BP 137/77  Pulse 69  Resp 20  Ht 5\' 11"  (1.803 m)  Wt 248 lb (112.492 kg)  BMI 34.59 kg/m2  SpO2 99%  Body mass index is 34.59 kg/(m^2).  Review of Systems denies chest pain but does have dyspnea on exertion. Also denies PND, orthopnea, hemoptysis, claudication. Does have chronic back pain. No radicular pain in legs. Other systems negative and complete review of systems     Objective:   Physical Exam blood pressure 137/77 heart rate 69 respirations 20 Gen.-alert and oriented x3 in no apparent distress HEENT normal for age Lungs no rhonchi or wheezing Cardiovascular regular rhythm no murmurs carotid pulses 3+ palpable no bruits audible Abdomen soft nontender no palpable masses-obese Musculoskeletal free of  major deformities Skin clear -no rashes Neurologic normal Lower extremities 3+ femoral and posterior tibial pulses palpable bilaterally with no edema  Today I reviewed the CT angiogram of the neck performed 2 weeks ago. Left ICA is occluded. Right ICA does not appear significantly  stenotic maybe 40% at its origin. There is a right subclavian artery stenosis.      Assessment:     #1 left ICA occlusion and mild right ICA stenosis-history of symptoms but no active symptoms at present time-patient on Coumadin chronically for A. fib #2 status post EVAR for AAA in 2010    Plan:     #1 return in 6 months with duplex scan of carotid artery on the right and CT angiogram to further followup aortic stent graft performed 2010 #2 if patient develops new neurologic symptoms he will be in touch with [place]

## 2012-01-12 ENCOUNTER — Encounter: Payer: Self-pay | Admitting: Cardiology

## 2012-01-15 ENCOUNTER — Ambulatory Visit (INDEPENDENT_AMBULATORY_CARE_PROVIDER_SITE_OTHER): Payer: Medicare Other | Admitting: *Deleted

## 2012-01-15 ENCOUNTER — Ambulatory Visit (INDEPENDENT_AMBULATORY_CARE_PROVIDER_SITE_OTHER): Payer: Medicare Other | Admitting: Cardiology

## 2012-01-15 ENCOUNTER — Encounter: Payer: Self-pay | Admitting: Cardiology

## 2012-01-15 VITALS — BP 118/84 | HR 68 | Ht 71.0 in | Wt 247.5 lb

## 2012-01-15 DIAGNOSIS — I4891 Unspecified atrial fibrillation: Secondary | ICD-10-CM

## 2012-01-15 DIAGNOSIS — I480 Paroxysmal atrial fibrillation: Secondary | ICD-10-CM

## 2012-01-15 DIAGNOSIS — I2510 Atherosclerotic heart disease of native coronary artery without angina pectoris: Secondary | ICD-10-CM

## 2012-01-15 DIAGNOSIS — I639 Cerebral infarction, unspecified: Secondary | ICD-10-CM

## 2012-01-15 DIAGNOSIS — Z7901 Long term (current) use of anticoagulants: Secondary | ICD-10-CM

## 2012-01-15 DIAGNOSIS — I6350 Cerebral infarction due to unspecified occlusion or stenosis of unspecified cerebral artery: Secondary | ICD-10-CM

## 2012-01-15 DIAGNOSIS — Z8673 Personal history of transient ischemic attack (TIA), and cerebral infarction without residual deficits: Secondary | ICD-10-CM

## 2012-01-15 LAB — POCT INR: INR: 2.4

## 2012-01-15 NOTE — Assessment & Plan Note (Signed)
Patient's cardiac status is stable. No further workup at this time.

## 2012-01-15 NOTE — Patient Instructions (Addendum)
Your physician wants you to follow-up in:  6 months. You will receive a reminder letter in the mail two months in advance. If you don't receive a letter, please call our office to schedule the follow-up appointment.   

## 2012-01-15 NOTE — Progress Notes (Signed)
HPI  Patient is seen today to followup coronary artery disease. I have followed in the past. He was seen most recently in the office by Mr. Alben Spittle on December 05, 2011. At that time he was following up the patient's admission to the hospital in August for a TIA. His echo had shown good LV function. There was evaluation by vascular surgery. There was plan for further evaluation as an outpatient. The patient has seen Dr. Hart Rochester for further evaluation and he is felt to be stable. There is no plan at this time for an endarterectomy.  Allergies  Allergen Reactions  . Diltiazem Hcl Other (See Comments)    unknown  . Oxycodone-Acetaminophen Other (See Comments)    unknown    Current Outpatient Prescriptions  Medication Sig Dispense Refill  . lisinopril (PRINIVIL,ZESTRIL) 10 MG tablet Take 1 tablet (10 mg total) by mouth daily.  60 tablet  3  . Multiple Vitamins-Minerals (MULTIVITAMIN WITH MINERALS) tablet Take 1 tablet by mouth daily.      . sildenafil (VIAGRA) 100 MG tablet Take 100 mg by mouth daily as needed. For erectile dysfunction       . simvastatin (ZOCOR) 10 MG tablet Take 10 mg by mouth at bedtime.      Marland Kitchen warfarin (COUMADIN) 5 MG tablet Take 1 tablet (5 mg total) by mouth daily. 90 day supply  105 tablet  1    History   Social History  . Marital Status: Single    Spouse Name: N/A    Number of Children: N/A  . Years of Education: N/A   Occupational History  . disabled    Social History Main Topics  . Smoking status: Former Smoker    Types: Cigarettes    Quit date: 02/03/2010  . Smokeless tobacco: Never Used  . Alcohol Use: No  . Drug Use: No  . Sexually Active: No   Other Topics Concern  . Not on file   Social History Narrative   Lives with son and his significant other, denies being married. Has 3 daughters & a son.  Several grandchildren. Divorced.Regular exercise.Prior to retirement, hung dry wall for a living.     Family History  Problem Relation Age of Onset   . Diabetes Mother   . Stroke Father   . Cancer Sister     thyroid cancer  . Heart disease Brother     Coronary artery disease    Past Medical History  Diagnosis Date  . CAD (coronary artery disease) CABG in 2004    F/U by Dr Genia Harold cardiology, cardiolite scan done elsewhere in October 2010 that was normal, no ischemia seen, EF 55-65% in 02/2005  . GERD (gastroesophageal reflux disease)   . Hypertension   . Hyperlipidemia   . Renal insufficiency   . Cellulitis and abscess of leg     right leg  . Tobacco abuse   . Chronic back pain   . Chronic neck pain   . Seborrheic dermatitis of scalp   . Abnormal LFTs     Hx of abnormal LFT's  . Bradycardia   . AAA (abdominal aortic aneurysm)     November 2010, Stenting, November, 2010  . Paroxysmal atrial fibrillation 02/22/2009    diltiazem po started but discontinued due to side effects.  . Ejection fraction     EF 40% in the past  /   EF 55-65% echo, November, 2006  . Hx of CABG     2004  . Carotid  artery disease     Doppler, October, 2012, and old LICA occlusion , RICA no significant abnormality  . Arthritis   . Prediabetes   . CVA (cerebral infarction)     R Carona radiata stroke 08/16/2011  . Stroke 11/26/11    Past Surgical History  Procedure Date  . Coronary artery bypass graft 2004  . Rotator cuff repair   . Abdominal aortic aneurysm repair 2010    Aortic stent repair    Patient Active Problem List  Diagnosis  . DEPRESSIVE DISORDER  . GERD  . Acute on chronic renal failure (baseline Stage II)  . DERMATITIS, SEBORRHEIC  . BACK PAIN, CHRONIC  . SLEEP APNEA  . Erectile dysfunction  . Leg cramps, sleep related  . CAD (coronary artery disease)  . Hypertension  . Hyperlipidemia  . Bradycardia  . Paroxysmal atrial fibrillation  . Ejection fraction  . Hx of CABG  . AAA (abdominal aortic aneurysm)  . Encounter for long-term (current) use of anticoagulants  . Carotid artery disease  . Prediabetes  . CVA  (cerebral infarction)  . Cellulitis of right anterior lower leg  . Preventative health care  . TIA (transient ischemic attack)  . A-fib  . Occlusion and stenosis of carotid artery without mention of cerebral infarction  . H/O TIA (transient ischemic attack) and stroke  . Abdominal aneurysm without mention of rupture    ROS   Patient denies fever, chills, headache, sweats, rash, change in vision, change in hearing, chest pain, cough, nausea vomiting, urinary symptoms. All other systems are reviewed and are negative.  PHYSICAL EXAM  Patient is oriented to person time and place. Affect is normal. There is no jugular venous distention. Lungs are clear. Respiratory effort is nonlabored. Cardiac exam reveals S1 and S2. There no clicks or significant murmurs. Abdomen is soft. There is no peripheral edema.  Filed Vitals:   01/15/12 1543  BP: 118/84  Pulse: 68  Height: 5\' 11"  (1.803 m)  Weight: 247 lb 8 oz (112.265 kg)  SpO2: 98%     ASSESSMENT & PLAN

## 2012-01-15 NOTE — Assessment & Plan Note (Signed)
Patient is being followed carefully by Dr. Hart Rochester of vascular surgery. No further workup on my part.

## 2012-02-05 ENCOUNTER — Encounter: Payer: Self-pay | Admitting: Internal Medicine

## 2012-02-05 NOTE — Progress Notes (Signed)
Patient ID: Dylan Morgan, male   DOB: January 17, 1946, 66 y.o.   MRN: [id number]  This patient is a CHRONIC NO-SHOW PATIENT that has a history of HYPERTENSION.  Please make sure to address hypertension during his next clinic appointment, and intervene as appropriate.    Within the AVS, please incorporate the following smartphrase: .htntips   Pertinent Data: BP Readings from Last 3 Encounters:  01/15/12 118/84  01/02/12 137/77  12/19/11 108/78    BMI: Estimated Body mass index is 34.52 kg/(m^2) as calculated from the following:   Height as of 01/15/12: 5\' 11" (1.803 m).   Weight as of 01/15/12: 247 lb 8 oz(112.265 kg).  Smoking History: History  Smoking status  . Former Smoker  . Types: Cigarettes  . Quit date: 02/03/2010  Smokeless tobacco  . Never Used    Last Basic Metabolic Panel:    Component Value Date/Time   NA 138 12/05/2011 0957   K 4.6 12/05/2011 0957   CL 104 12/05/2011 0957   CO2 26 12/05/2011 0957   BUN 33* 12/05/2011 0957   CREATININE 1.40* 12/05/2011 0957   CREATININE 1.25 11/26/2011 0805   GLUCOSE 104* 12/05/2011 0957   CALCIUM 9.7 12/05/2011 0957    Allergies: Allergies  Allergen Reactions  . Diltiazem Hcl Other (See Comments)    unknown  . Oxycodone-Acetaminophen Other (See Comments)    unknown

## 2012-02-12 ENCOUNTER — Ambulatory Visit (INDEPENDENT_AMBULATORY_CARE_PROVIDER_SITE_OTHER): Payer: Medicare Other | Admitting: *Deleted

## 2012-02-12 DIAGNOSIS — I6350 Cerebral infarction due to unspecified occlusion or stenosis of unspecified cerebral artery: Secondary | ICD-10-CM

## 2012-02-12 DIAGNOSIS — Z7901 Long term (current) use of anticoagulants: Secondary | ICD-10-CM

## 2012-02-12 DIAGNOSIS — I4891 Unspecified atrial fibrillation: Secondary | ICD-10-CM

## 2012-02-12 DIAGNOSIS — I639 Cerebral infarction, unspecified: Secondary | ICD-10-CM

## 2012-02-12 DIAGNOSIS — I480 Paroxysmal atrial fibrillation: Secondary | ICD-10-CM

## 2012-02-12 LAB — POCT INR: INR: 1.9

## 2012-02-12 NOTE — Assessment & Plan Note (Signed)
Lab Results  Component Value Date   CHOL 136 11/24/2011   CHOL 122 08/17/2011   CHOL 160 01/01/2011   Lab Results  Component Value Date   HDL 35* 11/24/2011   HDL 37* 08/17/2011   HDL 33* 01/01/2011   Lab Results  Component Value Date   LDLCALC UNABLE TO CALCULATE IF TRIGLYCERIDE OVER 400 mg/dL 07/10/8117   LDLCALC 43 08/17/2011   LDLCALC 68 01/01/2011   Lab Results  Component Value Date   TRIG 445* 11/24/2011   TRIG 211* 08/17/2011   TRIG 297* 01/01/2011   Lab Results  Component Value Date   CHOLHDL 3.9 11/24/2011   CHOLHDL 3.3 08/17/2011   CHOLHDL 4.8 01/01/2011   Lab Results  Component Value Date   LDLDIRECT 46 11/24/2011   LDLDIRECT 73.7 08/17/2006   His Direct LDL is well controlled on his current medication and dose.  We will continue to monitor and shoot for an LDL less then 70 with his known carotid disease and stroke.

## 2012-02-12 NOTE — Assessment & Plan Note (Signed)
Lab Results  Component Value Date   HGBA1C 5.8* 11/23/2011   HGBA1C 6.0* 08/17/2011   HGBA1C 5.9* 01/01/2011   Lab Results  Component Value Date   LDLCALC UNABLE TO CALCULATE IF TRIGLYCERIDE OVER 400 mg/dL 1/61/0960   CREATININE 1.40* 12/05/2011   His A1C is in the prediabetes/impaired fasting glucose range.  We discussed today dietary control to decrease his risk of developing diabetes.  We will follow up in 3 months to see how he is doing and adjust therapy from there.

## 2012-02-12 NOTE — Assessment & Plan Note (Signed)
He is currently asymptomatic from his event in August.  He is anticoagulated with coumadin and we are working to aggressively control his risk factors.  He will follow up with Dr. Pearlean Brownie for his comment on if these recurrent TIAs are caused by symptomatic carotid artery disease.  He also has follow up with Vascular surgery.  See additional problems for the status of his risk factor modifications.

## 2012-02-12 NOTE — Assessment & Plan Note (Signed)
He was recently diagnosed with paroxysmal atrial fibrillation and was started on coumadin.  He has a slow ventricular response which is likely the cause of his bradycardia during his hospitalization.  We will continue to monitor along with Dr. Myrtis Ser and continue warfarin for stroke prevention.

## 2012-02-12 NOTE — Assessment & Plan Note (Signed)
He has a known left sided occlusion and will follow with vascular surgery for consideration for a right sided endarterectomy if it is felt that this is the cause of his recurrent TIAs.  In the mean time modifications of his risk factors with HTN, HLD, and prediabetes will be maximized.

## 2012-02-12 NOTE — Assessment & Plan Note (Addendum)
Basic Metabolic Panel:    Component Value Date/Time   NA 138 12/05/2011 0957   K 4.6 12/05/2011 0957   CL 104 12/05/2011 0957   CO2 26 12/05/2011 0957   BUN 33* 12/05/2011 0957   CREATININE 1.40* 12/05/2011 0957   CREATININE 1.25 11/26/2011 0805   GLUCOSE 104* 12/05/2011 0957   CALCIUM 9.7 12/05/2011 0957   His Cr baseline appears to be about 1.3-1.4 and he is at his baseline today.  During his hospitalization he was noted to be in acute renal failure which was felt to likely be from decreased fluid intake as well as increased sweating with working outside in the heat.  He also continued to take his lisinopril.  We will continue his lisinopril today as his blood pressure appears well controlled.  He was also advised to avoid nephrotoxic drugs like NSAIDS and to continue his increased fluid intake.

## 2012-02-12 NOTE — Assessment & Plan Note (Signed)
BP Readings from Last 5 Encounters:  12/05/11 108/80  12/05/11 108/77  11/26/11 147/77  10/20/11 110/60  09/20/11 121/80   His BP looks well controlled on his current medications.  We will continue to monitor for aggressive risk factor modification to limit his risk of stroke.

## 2012-03-01 DIAGNOSIS — Z8679 Personal history of other diseases of the circulatory system: Secondary | ICD-10-CM | POA: Insufficient documentation

## 2012-03-01 DIAGNOSIS — E785 Hyperlipidemia, unspecified: Secondary | ICD-10-CM | POA: Insufficient documentation

## 2012-03-01 DIAGNOSIS — Z7901 Long term (current) use of anticoagulants: Secondary | ICD-10-CM | POA: Insufficient documentation

## 2012-03-01 DIAGNOSIS — Z809 Family history of malignant neoplasm, unspecified: Secondary | ICD-10-CM | POA: Insufficient documentation

## 2012-03-01 DIAGNOSIS — M25569 Pain in unspecified knee: Secondary | ICD-10-CM | POA: Insufficient documentation

## 2012-03-01 DIAGNOSIS — G8929 Other chronic pain: Secondary | ICD-10-CM | POA: Insufficient documentation

## 2012-03-01 DIAGNOSIS — Z87891 Personal history of nicotine dependence: Secondary | ICD-10-CM | POA: Insufficient documentation

## 2012-03-01 DIAGNOSIS — Z833 Family history of diabetes mellitus: Secondary | ICD-10-CM | POA: Insufficient documentation

## 2012-03-01 DIAGNOSIS — R52 Pain, unspecified: Secondary | ICD-10-CM | POA: Insufficient documentation

## 2012-03-01 DIAGNOSIS — M542 Cervicalgia: Secondary | ICD-10-CM | POA: Insufficient documentation

## 2012-03-01 DIAGNOSIS — M549 Dorsalgia, unspecified: Secondary | ICD-10-CM | POA: Insufficient documentation

## 2012-03-01 DIAGNOSIS — I10 Essential (primary) hypertension: Secondary | ICD-10-CM | POA: Insufficient documentation

## 2012-03-01 DIAGNOSIS — I2510 Atherosclerotic heart disease of native coronary artery without angina pectoris: Secondary | ICD-10-CM | POA: Insufficient documentation

## 2012-03-02 ENCOUNTER — Encounter (HOSPITAL_COMMUNITY): Payer: Self-pay | Admitting: Emergency Medicine

## 2012-03-02 ENCOUNTER — Emergency Department (HOSPITAL_COMMUNITY)
Admission: EM | Admit: 2012-03-02 | Discharge: 2012-03-02 | Disposition: A | Discharge status: Home / Self Care | Payer: Medicare Other | Attending: Emergency Medicine | Admitting: Emergency Medicine

## 2012-03-02 DIAGNOSIS — M2550 Pain in unspecified joint: Secondary | ICD-10-CM

## 2012-03-02 DIAGNOSIS — R52 Pain, unspecified: Secondary | ICD-10-CM

## 2012-03-02 LAB — CBC
Hemoglobin: 12.7 g/dL — ABNORMAL LOW (ref 13.0–17.0)
MCH: 29.9 pg (ref 26.0–34.0)
MCHC: 33.5 g/dL (ref 30.0–36.0)
Platelets: 136 10*3/uL — ABNORMAL LOW (ref 150–400)
RDW: 13.3 % (ref 11.5–15.5)

## 2012-03-02 LAB — URINALYSIS, ROUTINE W REFLEX MICROSCOPIC
Bilirubin Urine: NEGATIVE
Ketones, ur: NEGATIVE mg/dL
Nitrite: NEGATIVE
Protein, ur: NEGATIVE mg/dL
Urobilinogen, UA: 0.2 mg/dL (ref 0.0–1.0)

## 2012-03-02 LAB — BASIC METABOLIC PANEL
Calcium: 9.5 mg/dL (ref 8.4–10.5)
GFR calc Af Amer: 56 mL/min — ABNORMAL LOW (ref >90)
GFR calc non Af Amer: 48 mL/min — ABNORMAL LOW (ref >90)
Glucose, Bld: 119 mg/dL — ABNORMAL HIGH (ref 70–99)
Sodium: 140 mEq/L (ref 135–145)

## 2012-03-02 LAB — PROTIME-INR: INR: 2.76 — ABNORMAL HIGH (ref 0.00–1.49)

## 2012-03-02 MED ORDER — MORPHINE SULFATE 4 MG/ML IJ SOLN
4.0000 mg | Freq: Once | INTRAMUSCULAR | Status: DC
  Filled 2012-03-02: qty 1

## 2012-03-02 MED ORDER — KETOROLAC TROMETHAMINE 30 MG/ML IJ SOLN
30.0000 mg | Freq: Once | INTRAMUSCULAR | Status: AC
  Administered 2012-03-02: 30 mg via INTRAVENOUS
  Filled 2012-03-02: qty 1

## 2012-03-02 MED ORDER — HYDROCODONE-ACETAMINOPHEN 5-325 MG PO TABS: 2.0000 | ORAL_TABLET | Freq: Four times a day (QID) | ORAL | Status: DC | PRN

## 2012-03-02 NOTE — ED Notes (Signed)
C/o generalized body aches and decreased urination since this morning.  Denies fever or chills.

## 2012-03-02 NOTE — ED Notes (Signed)
Pt stated that he has been having bilateral leg pain for 1 week. The pain is an achy feeling. He has mild redness to ankles. Mild edema to ankles. CNS is intact. Pt stated that he started becoming weaker starting today. Pt stated that he also has been having a hard time urinating  Starting this morning. He stated that he has urinated x 2 today. No pain with urination. No itching. No difficulty urinating. No cardiac or respiratory distress. Will continue to monitor.

## 2012-03-02 NOTE — ED Provider Notes (Addendum)
History     CSN: [id number]  Arrival date & time 03/01/12  2355   First MD Initiated Contact with Patient 03/02/12 0009      Chief Complaint  Patient presents with  . Generalized Body Aches  . Urinary Retention    (Consider location/radiation/quality/duration/timing/severity/associated sxs/prior treatment) HPI Comments: Mr. Maziarz presents ambulatory for evaluation of bilateral leg pain and soreness.  He states he had an active day during which he worked on the ground changing a Designer, jewellery on acar.  He reports he felt sore and stiff after completing his work.  He states he has had similar pain in the past and denies specific point tenderness, fever, joint swelling, calf pain or swelling, and trauma.  The history is provided by the patient. No language interpreter was used.    Past Medical History  Diagnosis Date  . CAD (coronary artery disease) CABG in 2004    Nuclear, October, 2010, no ischemia, EF 71%  . GERD (gastroesophageal reflux disease)   . Hypertension   . Hyperlipidemia   . Renal insufficiency   . Cellulitis and abscess of leg     right leg  . Tobacco abuse   . Chronic back pain   . Chronic neck pain   . Seborrheic dermatitis of scalp   . Abnormal LFTs     Hx of abnormal LFT's  . Bradycardia   . AAA (abdominal aortic aneurysm)     November 2010, Stenting, November, 2010  . Paroxysmal atrial fibrillation 02/22/2009    diltiazem po started but discontinued due to side effects.  . Ejection fraction     EF 40% in the past  /   EF 55-65% echo, November, 2006  . Hx of CABG     2004  . Carotid artery disease     Doppler, October, 2012, and old LICA occlusion , RICA no significant abnormality  . Arthritis   . Prediabetes   . CVA (cerebral infarction)     R Carona radiata stroke 08/16/2011  . Stroke 11/26/11    Past Surgical History  Procedure Date  . Coronary artery bypass graft 2004  . Rotator cuff repair   . Abdominal aortic aneurysm repair 2010    Aortic  stent repair    Family History  Problem Relation Age of Onset  . Diabetes Mother   . Stroke Father   . Cancer Sister     thyroid cancer  . Heart disease Brother     Coronary artery disease    History  Substance Use Topics  . Smoking status: Former Smoker    Types: Cigarettes    Quit date: 02/03/2010  . Smokeless tobacco: Never Used  . Alcohol Use: No      Review of Systems  Constitutional: Positive for chills and fatigue. Negative for fever, diaphoresis, activity change and appetite change.  HENT: Negative for congestion, sore throat, facial swelling, rhinorrhea, trouble swallowing and neck pain.   Respiratory: Negative for cough, shortness of breath and wheezing.   Cardiovascular: Negative for chest pain, palpitations and leg swelling.  Gastrointestinal: Negative for nausea, vomiting, abdominal pain, diarrhea and constipation.  Genitourinary: Positive for decreased urine volume. Negative for dysuria, urgency, frequency, hematuria and flank pain.  Musculoskeletal: Positive for myalgias, back pain and arthralgias. Negative for joint swelling and gait problem.  Skin: Negative for color change, pallor, rash and wound.  Neurological: Negative for dizziness, syncope, speech difficulty, weakness, light-headedness, numbness and headaches.  Hematological: Does not bruise/bleed easily.  Psychiatric/Behavioral: Negative.  Allergies  Diltiazem hcl and Oxycodone-acetaminophen  Home Medications   Current Outpatient Rx  Name  Route  Sig  Dispense  Refill  . LISINOPRIL 10 MG PO TABS   Oral   Take 10 mg by mouth at bedtime.         . MULTI-VITAMIN/MINERALS PO TABS   Oral   Take 1 tablet by mouth at bedtime.          Marland Kitchen SIMVASTATIN 10 MG PO TABS   Oral   Take 10 mg by mouth at bedtime.         . WARFARIN SODIUM 5 MG PO TABS   Oral   Take 5 mg by mouth at bedtime.         Marland Kitchen SILDENAFIL CITRATE 100 MG PO TABS   Oral   Take 100 mg by mouth daily as needed. For  erectile dysfunction            BP 102/57  Pulse 73  Temp 97.7 F (36.5 C) (Oral)  Resp 22  SpO2 95%  Physical Exam  Nursing note and vitals reviewed. Constitutional: He is oriented to person, place, and time. He appears well-developed and well-nourished. No distress.  HENT:  Head: Normocephalic.  Right Ear: External ear normal.  Left Ear: External ear normal.  Nose: Nose normal.  Mouth/Throat: Oropharynx is clear and moist. No oropharyngeal exudate.  Eyes: Conjunctivae normal are normal. Pupils are equal, round, and reactive to light. Right eye exhibits no discharge. Left eye exhibits no discharge. No scleral icterus.  Neck: Normal range of motion. Neck supple. No JVD present. No tracheal deviation present.  Cardiovascular: Regular rhythm, normal heart sounds, intact distal pulses and normal pulses.   No extrasystoles are present. Bradycardia present.  PMI is not displaced.  Exam reveals no gallop and no decreased pulses.   No murmur heard. Pulmonary/Chest: Breath sounds normal. No stridor. No respiratory distress. He has no wheezes. He has no rales. He exhibits no tenderness.  Abdominal: Soft. Bowel sounds are normal. He exhibits no distension and no mass. There is no tenderness. There is no rebound and no guarding.  Musculoskeletal: Normal range of motion. He exhibits edema (trace) and tenderness (very vague, nonspecific lower extremity.  no skin chages or swelling appreciated.  no point tenderness.  ).  Lymphadenopathy:    He has no cervical adenopathy.  Neurological: He is alert and oriented to person, place, and time. No cranial nerve deficit. Coordination (note a steady gait) normal.  Skin: Skin is warm and dry. No rash noted. He is not diaphoretic. No erythema. No pallor.  Psychiatric: He has a normal mood and affect. His behavior is normal.    ED Course  Procedures (including critical care time)   Labs Reviewed  URINALYSIS, ROUTINE W REFLEX MICROSCOPIC  CBC  BASIC  METABOLIC PANEL  PROTIME-INR   No results found.   No diagnosis found.    MDM  Pt presents for evaluation of bilateral leg pain.  He is afebrile, appears nontoxic, note stable VS, NAD.  HE has no evidence of cellulitis on exam.  He also has no specific joint swelling, effusions, or calf tenderness.  He describes discomfort that appears consistent with chronic arthritic pain.  Will obtain screening labs and check his INR while providing symptomatic care.  Will reassess.  0345.  Pt sleep, easily arouse.  Pain has resolved.  Note no acute lab abnormalities.  He appears nontoxic.  He describes having been outside working on the  ground changing a muffler throughout the morning and afternoon prior to the onset of the discomfort.  Will prescribe a short course of vicodin.  Encouraged outpt f/u with his PMD.     Tobin Chad, MD 03/02/12 1610  Tobin Chad, MD 03/02/12 9604  Tobin Chad, MD 03/12/12 5409

## 2012-03-02 NOTE — ED Notes (Signed)
Pt has 1 side rail down. Educated on safety and still desires IV to be down. Made oncoming RN-Bobby aware.

## 2012-03-02 NOTE — ED Notes (Signed)
Reita Cliche RN given report on pt.

## 2012-03-08 ENCOUNTER — Encounter: Payer: Self-pay | Admitting: Radiation Oncology

## 2012-03-08 ENCOUNTER — Ambulatory Visit (INDEPENDENT_AMBULATORY_CARE_PROVIDER_SITE_OTHER): Payer: Medicare Other | Admitting: Radiation Oncology

## 2012-03-08 VITALS — BP 119/80 | HR 78 | Temp 97.2°F | Ht 71.0 in | Wt 260.9 lb

## 2012-03-08 DIAGNOSIS — M79604 Pain in right leg: Secondary | ICD-10-CM

## 2012-03-08 DIAGNOSIS — Z299 Encounter for prophylactic measures, unspecified: Secondary | ICD-10-CM

## 2012-03-08 DIAGNOSIS — M79609 Pain in unspecified limb: Secondary | ICD-10-CM

## 2012-03-08 DIAGNOSIS — Z23 Encounter for immunization: Secondary | ICD-10-CM

## 2012-03-08 DIAGNOSIS — M79605 Pain in left leg: Secondary | ICD-10-CM

## 2012-03-08 LAB — CBC WITH DIFFERENTIAL/PLATELET
Basophils Absolute: 0 10*3/uL (ref 0.0–0.1)
Basophils Relative: 1 % (ref 0–1)
Eosinophils Relative: 2 % (ref 0–5)
HCT: 40.1 % (ref 39.0–52.0)
MCHC: 34.7 g/dL (ref 30.0–36.0)
MCV: 87.2 fL (ref 78.0–100.0)
Monocytes Absolute: 0.7 10*3/uL (ref 0.1–1.0)
Neutro Abs: 2.9 10*3/uL (ref 1.7–7.7)
RDW: 14.4 % (ref 11.5–15.5)

## 2012-03-08 NOTE — Patient Instructions (Addendum)
General instructions:  You will be contacted regarding scheduling for studies to further evaluate your leg pain. These include venous doppler studies and ankle-brachial index studies to evaluate your veins and arteries, respectively. Please return to clinic or seek care in the emergency room should your symptoms worsen prior to your next scheduled visit.

## 2012-03-09 DIAGNOSIS — M79605 Pain in left leg: Secondary | ICD-10-CM | POA: Insufficient documentation

## 2012-03-09 LAB — COMPREHENSIVE METABOLIC PANEL
AST: 20 U/L (ref 0–37)
Alkaline Phosphatase: 87 U/L (ref 39–117)
BUN: 28 mg/dL — ABNORMAL HIGH (ref 6–23)
Creat: 1.51 mg/dL — ABNORMAL HIGH (ref 0.50–1.35)

## 2012-03-09 NOTE — Progress Notes (Signed)
  Subjective:    Patient ID: Dylan Morgan, male    DOB: [birth year], 66 y.o.   MRN: [id number]  HPI Pt with PMH significant for AAA, carotid artery disease, CAD, cellulitis of RLE presents for evaluation of lower extremity pain x 2 weeks. Pt states that the pain is in his bilateral lower extremities below the knee. He states that the pain is slightly greater in the LLE than RLE at the moment, but that this fluctuates regularly. He states that he has had these symptoms every winter since 2004 when the "first cold spell hits." He states his pain is worsened by cold air and by having his legs in a dependent position, except when walking, which he states actually improves the pain. He states that he tried using compression stockings in the past, with modest relief, and states that the pain is very much improved when supine. He also states that he has some numbness of both feet when they become cold. He denies any limitations in ambulation from the pain, and states he is in no way limited in the distance he can walk without pain. He denies fever or chills. Denies any increased warmth in either LE. Denies any recent change in activity or immobilization. Pt states he otherwise feels well and has no other complaints during his visit.    Review of Systems  Constitutional: Negative for fever and chills.  HENT: Negative.   Eyes: Negative.   Respiratory: Negative for cough, shortness of breath and wheezing.   Cardiovascular: Positive for leg swelling (bilateral feet, pretibial areas). Negative for chest pain and palpitations.  Gastrointestinal: Negative for nausea, vomiting, abdominal pain, diarrhea and blood in stool.  Genitourinary: Negative for dysuria and hematuria.  Musculoskeletal: Negative.   Skin: Negative for rash and wound.  Neurological: Negative for light-headedness and headaches.  Hematological: Negative.   Psychiatric/Behavioral: Negative.        Objective:   Physical Exam  Constitutional:  He is oriented to person, place, and time. He appears well-developed and well-nourished. No distress.  HENT:  Head: Normocephalic and atraumatic.  Eyes: Pupils are equal, round, and reactive to light. No scleral icterus.  Neck: Normal range of motion. No tracheal deviation present. No thyromegaly present.  Cardiovascular: Normal rate and regular rhythm.   No murmur heard. Pulses:      Dorsalis pedis pulses are 1+ on the right side, and 2+ on the left side.  Pulmonary/Chest: Effort normal. He has no wheezes. He has no rales.  Abdominal: Soft. Bowel sounds are normal. He exhibits no distension. There is no tenderness.  Musculoskeletal: Normal range of motion. He exhibits edema (1+ pitting pretibial edema bilaterally) and tenderness (Minimal TTP of BLE below the knees).  Neurological: He is alert and oriented to person, place, and time. No cranial nerve deficit.  Skin: Skin is warm and dry. No erythema.       Mild, hyperpigmented skin changes in bilateral pretibial areas.  Psychiatric: He has a normal mood and affect. His behavior is normal.          Assessment & Plan:

## 2012-03-09 NOTE — Assessment & Plan Note (Signed)
Patient's complaints are chronic x 9 years, and have only mildly worsened in this interval of time. Given the skin changes he has that are consistent with venous insufficiency, in addition to improvement of symptoms when supine and with compression stockings, pt's symptoms are likely due to chronic venous stasis. Pt also likely has some degree of peripheral arterial disease given his history of vascular disease in other arterial systems. Unclear how extensive this may be, however, as pt completely denies any claudication symptoms. Pt admits that pain is much improved when he avoids the cold, and was instructed to do so at this time. He denies any history of Reyaud's disease and complaints do not include pallor or cyanosis of the lower extremities.  - schedule lower extremity venous dopplers to r/o occlusive venous disease - schedule ABIs to r/o PAD - f/u visit in [facility] after results obtained (1-2 months)

## 2012-03-11 ENCOUNTER — Encounter: Payer: Self-pay | Admitting: *Deleted

## 2012-03-11 NOTE — Addendum Note (Signed)
Addended by: Elenor Legato R on: 03/11/2012 12:18 PM   Modules accepted: Orders

## 2012-03-12 ENCOUNTER — Encounter (HOSPITAL_COMMUNITY): Payer: Self-pay | Admitting: Emergency Medicine

## 2012-03-12 DIAGNOSIS — G8929 Other chronic pain: Secondary | ICD-10-CM | POA: Insufficient documentation

## 2012-03-12 DIAGNOSIS — I6529 Occlusion and stenosis of unspecified carotid artery: Principal | ICD-10-CM | POA: Insufficient documentation

## 2012-03-12 DIAGNOSIS — N179 Acute kidney failure, unspecified: Secondary | ICD-10-CM | POA: Insufficient documentation

## 2012-03-12 DIAGNOSIS — I129 Hypertensive chronic kidney disease with stage 1 through stage 4 chronic kidney disease, or unspecified chronic kidney disease: Secondary | ICD-10-CM | POA: Insufficient documentation

## 2012-03-12 DIAGNOSIS — N189 Chronic kidney disease, unspecified: Secondary | ICD-10-CM | POA: Insufficient documentation

## 2012-03-12 DIAGNOSIS — E785 Hyperlipidemia, unspecified: Secondary | ICD-10-CM | POA: Insufficient documentation

## 2012-03-12 DIAGNOSIS — Z951 Presence of aortocoronary bypass graft: Secondary | ICD-10-CM | POA: Insufficient documentation

## 2012-03-12 DIAGNOSIS — K219 Gastro-esophageal reflux disease without esophagitis: Secondary | ICD-10-CM | POA: Insufficient documentation

## 2012-03-12 DIAGNOSIS — Z8673 Personal history of transient ischemic attack (TIA), and cerebral infarction without residual deficits: Secondary | ICD-10-CM | POA: Insufficient documentation

## 2012-03-12 DIAGNOSIS — I4891 Unspecified atrial fibrillation: Secondary | ICD-10-CM | POA: Insufficient documentation

## 2012-03-12 NOTE — ED Notes (Signed)
PT. REPORTS LEFT NECK PAIN ONSET THIS MORNING , DENIES INJURY , STATES HISTORY OF CABG .

## 2012-03-13 ENCOUNTER — Encounter (HOSPITAL_COMMUNITY): Payer: Self-pay

## 2012-03-13 ENCOUNTER — Emergency Department (HOSPITAL_COMMUNITY): Payer: Medicare Other

## 2012-03-13 ENCOUNTER — Observation Stay (HOSPITAL_COMMUNITY)
Admission: EM | Admit: 2012-03-13 | Discharge: 2012-03-13 | Disposition: A | Discharge status: Home / Self Care | Payer: Medicare Other | Attending: Internal Medicine | Admitting: Internal Medicine

## 2012-03-13 DIAGNOSIS — N179 Acute kidney failure, unspecified: Secondary | ICD-10-CM

## 2012-03-13 DIAGNOSIS — I6529 Occlusion and stenosis of unspecified carotid artery: Secondary | ICD-10-CM

## 2012-03-13 DIAGNOSIS — M542 Cervicalgia: Secondary | ICD-10-CM

## 2012-03-13 DIAGNOSIS — R9431 Abnormal electrocardiogram [ECG] [EKG]: Secondary | ICD-10-CM

## 2012-03-13 DIAGNOSIS — I4891 Unspecified atrial fibrillation: Secondary | ICD-10-CM

## 2012-03-13 DIAGNOSIS — I480 Paroxysmal atrial fibrillation: Secondary | ICD-10-CM | POA: Diagnosis present

## 2012-03-13 DIAGNOSIS — N183 Chronic kidney disease, stage 3 unspecified: Secondary | ICD-10-CM | POA: Diagnosis present

## 2012-03-13 DIAGNOSIS — G8929 Other chronic pain: Secondary | ICD-10-CM | POA: Diagnosis present

## 2012-03-13 LAB — COMPREHENSIVE METABOLIC PANEL
ALT: 18 U/L (ref 0–53)
Alkaline Phosphatase: 84 U/L (ref 39–117)
BUN: 34 mg/dL — ABNORMAL HIGH (ref 6–23)
CO2: 26 mEq/L (ref 19–32)
GFR calc Af Amer: 40 mL/min — ABNORMAL LOW (ref >90)
GFR calc non Af Amer: 35 mL/min — ABNORMAL LOW (ref >90)
Glucose, Bld: 116 mg/dL — ABNORMAL HIGH (ref 70–99)
Potassium: 5.1 mEq/L (ref 3.5–5.1)
Sodium: 139 mEq/L (ref 135–145)
Total Bilirubin: 0.4 mg/dL (ref 0.3–1.2)
Total Protein: 7.8 g/dL (ref 6.0–8.3)

## 2012-03-13 LAB — CBC WITH DIFFERENTIAL/PLATELET
Eosinophils Absolute: 0.1 10*3/uL (ref 0.0–0.7)
Hemoglobin: 14 g/dL (ref 13.0–17.0)
Lymphocytes Relative: 28 % (ref 12–46)
Lymphs Abs: 2.2 10*3/uL (ref 0.7–4.0)
MCH: 30.6 pg (ref 26.0–34.0)
MCV: 89.9 fL (ref 78.0–100.0)
Monocytes Relative: 10 % (ref 3–12)
Neutrophils Relative %: 60 % (ref 43–77)
Platelets: 174 10*3/uL (ref 150–400)
RBC: 4.57 MIL/uL (ref 4.22–5.81)
WBC: 8 10*3/uL (ref 4.0–10.5)

## 2012-03-13 LAB — HEMOGLOBIN A1C
Hgb A1c MFr Bld: 5.7 % — ABNORMAL HIGH (ref <5.7)
Mean Plasma Glucose: 117 mg/dL — ABNORMAL HIGH (ref <117)

## 2012-03-13 LAB — BASIC METABOLIC PANEL
BUN: 31 mg/dL — ABNORMAL HIGH (ref 6–23)
CO2: 25 mEq/L (ref 19–32)
Calcium: 9.6 mg/dL (ref 8.4–10.5)
Creatinine, Ser: 1.57 mg/dL — ABNORMAL HIGH (ref 0.50–1.35)
Glucose, Bld: 138 mg/dL — ABNORMAL HIGH (ref 70–99)

## 2012-03-13 LAB — POCT I-STAT TROPONIN I: Troponin i, poc: 0 ng/mL (ref 0.00–0.08)

## 2012-03-13 MED ORDER — ASPIRIN EC 81 MG PO TBEC
81.0000 mg | DELAYED_RELEASE_TABLET | Freq: Every day | ORAL | Status: DC
  Administered 2012-03-13: 81 mg via ORAL
  Filled 2012-03-13: qty 1

## 2012-03-13 MED ORDER — ONDANSETRON HCL 4 MG PO TABS: 4.0000 mg | ORAL_TABLET | Freq: Four times a day (QID) | ORAL | Status: DC | PRN

## 2012-03-13 MED ORDER — SODIUM CHLORIDE 0.9 % IV SOLN
INTRAVENOUS | Status: DC
  Administered 2012-03-13: 07:00:00 via INTRAVENOUS

## 2012-03-13 MED ORDER — WARFARIN SODIUM 5 MG PO TABS
5.0000 mg | ORAL_TABLET | Freq: Once | ORAL | Status: DC
  Filled 2012-03-13: qty 1

## 2012-03-13 MED ORDER — WARFARIN - PHARMACIST DOSING INPATIENT: Freq: Every day | Status: DC

## 2012-03-13 MED ORDER — ONDANSETRON HCL 4 MG/2ML IJ SOLN: 4.0000 mg | Freq: Four times a day (QID) | INTRAMUSCULAR | Status: DC | PRN

## 2012-03-13 MED ORDER — SIMVASTATIN 10 MG PO TABS
10.0000 mg | ORAL_TABLET | Freq: Every day | ORAL | Status: DC
  Filled 2012-03-13: qty 1

## 2012-03-13 MED ORDER — HYDROCODONE-ACETAMINOPHEN 5-325 MG PO TABS: 1.0000 | ORAL_TABLET | Freq: Four times a day (QID) | ORAL | Status: DC | PRN

## 2012-03-13 MED ORDER — ASPIRIN 81 MG PO TBEC: 81.0000 mg | DELAYED_RELEASE_TABLET | Freq: Every day | ORAL | Status: DC

## 2012-03-13 NOTE — H&P (Signed)
Hospital Admission Note Date: 03/13/2012  Patient name: Dylan Morgan Medical record number: [id number] Date of birth: 08-Jan-1946 Age: 66 y.o. Gender: male PCP: Denton Ar, MD  Medical Service: Internal Medicine Teaching Service  Attending physician:     Dr. Eben Burow   1st Contact:  Dr. Virgina Organ  Pager:[phone] 2nd Contact:  Dr. Everardo Beals  [phone] After 5 pm or weekends: 1st Contact:      Pager: [phone] 2nd Contact:      Pager: [phone]  Chief Complaint: Neck pain  History of Present Illness: Mr. Costlow is a 66 year-old man with PMH significant for CKD stage 3, AAA, carotid artery disease, CAD with CABG in 2004,  TIA, cellulitis of RLE presents who presents to the [facility] ED for evaluation of left neck pain that has been present for the past 3 years. He explains that he usually takes Advil or aspirin for this pain. He took Advil 200 mg yesterday but the pain  has persisted. He has taken codeine (prescribed to him during a previous ED visit) but it has not alleviated his neck pain. The pain is described as an ache that is worse with neck movement; at its worse it is a 7/10 but currently is improved to a 6/10. He also reports a sore throat for a couple of days with a cough productive of scant white-green sputum. He denies fever or chills. He reports drinking less water yesterday as he often "forgets" to drink water.  He reports feeling dizzy.     He specifically denies chest pain. He states that he felt arm numbness and chest pressure when he had his MI years ago.   He denies headache, shortness of breath, abdominal pain, polyuria, increased urinary frequency or diarrhea, hematochezia, or hematuria.  Of note, he has an appointment with the Vascular Lab on 12/12 at 10:30 AM for bilateral ABI study.   Meds: Current Outpatient Rx  Name  Route  Sig  Dispense  Refill  . HYDROCODONE-ACETAMINOPHEN 5-325 MG PO TABS   Oral   Take 2 tablets by mouth every 6 (six) hours as needed for pain.   12  tablet   0   . LISINOPRIL 10 MG PO TABS   Oral   Take 10 mg by mouth at bedtime.         . MULTI-VITAMIN/MINERALS PO TABS   Oral   Take 1 tablet by mouth at bedtime.          Marland Kitchen SILDENAFIL CITRATE 100 MG PO TABS   Oral   Take 100 mg by mouth daily as needed. For erectile dysfunction          . SIMVASTATIN 10 MG PO TABS   Oral   Take 10 mg by mouth at bedtime.         . WARFARIN SODIUM 5 MG PO TABS   Oral   Take 5 mg by mouth at bedtime.           Allergies: Allergies as of 03/12/2012 - Review Complete 03/12/2012  Allergen Reaction Noted  . Diltiazem hcl Itching   . Oxycodone-acetaminophen Other (See Comments)    Past Medical History  Diagnosis Date  . CAD (coronary artery disease) CABG in 2004    Nuclear, October, 2010, no ischemia, EF 71%  . GERD (gastroesophageal reflux disease)   . Hypertension   . Hyperlipidemia   . Renal insufficiency   . Cellulitis and abscess of leg     right leg  . Tobacco abuse   .  Chronic back pain   . Chronic neck pain   . Seborrheic dermatitis of scalp   . Abnormal LFTs     Hx of abnormal LFT's  . Bradycardia   . AAA (abdominal aortic aneurysm)     November 2010, Stenting, November, 2010  . Paroxysmal atrial fibrillation 02/22/2009    diltiazem po started but discontinued due to side effects.  . Ejection fraction     EF 40% in the past  /   EF 55-65% echo, November, 2006  . Hx of CABG     2004  . Carotid artery disease     Doppler, October, 2012, and old LICA occlusion , RICA no significant abnormality  . Arthritis   . Prediabetes   . CVA (cerebral infarction)     R Carona radiata stroke 08/16/2011  . Stroke 11/26/11   Past Surgical History  Procedure Date  . Coronary artery bypass graft 2004  . Rotator cuff repair   . Abdominal aortic aneurysm repair 2010    Aortic stent repair   Family History  Problem Relation Age of Onset  . Diabetes Mother   . Stroke Father   . Cancer Sister     thyroid cancer  .  Heart disease Brother     Coronary artery disease   History   Social History  . Marital Status: Single    Spouse Name: N/A    Number of Children: N/A  . Years of Education: N/A   Occupational History  . disabled    Social History Main Topics  . Smoking status: Former Smoker    Types: Cigarettes    Quit date: 02/03/2010  . Smokeless tobacco: Never Used  . Alcohol Use: No  . Drug Use: No  . Sexually Active: No   Other Topics Concern  . Not on file   Social History Narrative   Lives with son and his significant other, denies being married. Has 3 daughters & a son.  Several grandchildren. Divorced.Regular exercise.Prior to retirement, hung dry wall for a living.     Review of Systems: Pertinent items are noted in HPI.  Physical Exam: Blood pressure 112/64, pulse 69, temperature 98.5 F (36.9 C), temperature source Oral, resp. rate 20, SpO2 95.00%. BP 112/64  Pulse 69  Temp 98.5 F (36.9 C) (Oral)  Resp 20  SpO2 95%  General Appearance:    Alert, cooperative, no distress, appears stated age  Head:    Normocephalic, without obvious abnormality, atraumatic  Eyes:    Pupils constricted to 3mm bilaterally, conjunctiva/corneas clear, Right EOM intact, Left EOM limited with abduction and adduction, Left ptosis, chronic loss of left central and vision.           Nose:   Nares normal, septum midline, mucosa normal, no drainage    or sinus tenderness  Throat:   Lips, mucosa, and tongue normal; edentulous  and gums normal. No oropharyngeal edema or erythema  Neck:   Supple, symmetrical, trachea midline, no adenopathy;       thyroid: No enlargement/tenderness/nodules; no carotid   Bruit. No left sided edema or erythema, ROM intact with sided to side rotation, flexion, and extension.   Back:     Symmetric, no curvature, ROM normal, no CVA tenderness  Lungs:     Clear to auscultation bilaterally, respirations unlabored  Chest wall:    No tenderness or deformity  Heart:     Bradycardia, regular rhythm, distant heart sounds, no murmur, rub,  or gallop appreciated  Abdomen:     Obese, Soft, non-tender, bowel sounds active all four quadrants        Extremities:   Extremities normal, atraumatic, 1+ pitting edema bilaterally up to his knees.   Pulses:   Faint dorsalis pedis pulses bilaterally   Skin:   Skin color, texture, turgor normal, macular rash on his lower abdomen and anterior forearms suspicious for contact dermatitis. Hyperpigmented skin changes in bilateral anterior shins.   Lymph nodes:   Cervical, supraclavicular, nodes normal  Neurologic:   Alert and Oriented x3, No Neurologic deficit noted except for chronic left eye vision field defects. Ptosis, and EOM changes.   Normal strength, sensation  throughout    Lab results: Basic Metabolic Panel:  Basename 03/12/12 2345  NA 139  K 5.1  CL 100  CO2 26  GLUCOSE 116*  BUN 34*  CREATININE 1.90*  CALCIUM 9.7  MG --  PHOS --   Liver Function Tests:  Basename 03/12/12 2345  AST 25  ALT 18  ALKPHOS 84  BILITOT 0.4  PROT 7.8  ALBUMIN 3.7   CBC:  Basename 03/12/12 2345  WBC 8.0  NEUTROABS 4.8  HGB 14.0  HCT 41.1  MCV 89.9  PLT 174   Coagulation:  Basename 03/13/12 0154  LABPROT 23.6*  INR 2.21*   Urine Drug Screen: Drugs of Abuse     Component Value Date/Time   LABOPIA NONE DETECTED 11/23/2011 2129   COCAINSCRNUR NONE DETECTED 11/23/2011 2129   LABBENZ NONE DETECTED 11/23/2011 2129   AMPHETMU NONE DETECTED 11/23/2011 2129   THCU NONE DETECTED 11/23/2011 2129   LABBARB NONE DETECTED 11/23/2011 2129     Imaging results:  Dg Chest 2 View  03/13/2012  *RADIOLOGY REPORT*  Clinical Data: Neck pain.  CHEST - 2 VIEW  Comparison: None.  Findings: Mild cardiomegaly.  Tortuous thoracic aorta.  CABG. Collapse / opacification of the inferior right middle lobe seen on the lateral view.  No airspace disease.  No pleural effusion. Partially visualized stent graft in the upper abdomen.  IMPRESSION:  Cardiomegaly without failure.  Inferior right middle lobe opacity favored to represent discoid atelectasis.  Pneumonia less likely.   Original Report Authenticated By: Andreas Newport, M.D.    Ct Soft Tissue Neck Wo Contrast  03/13/2012  *RADIOLOGY REPORT*  Clinical Data: Left sided neck pain and swelling.  CT NECK WITHOUT CONTRAST  Technique:  Multidetector CT imaging of the neck was performed without intravenous contrast.  Comparison: CT angio of the neck 12/21/2011.  Findings: Postoperative changes in the left orbit consistent with banding.  Vascular calcifications scattered in the cervical carotid arteries.  No evidence of significant displacement of the carotid or jugular vessels or in the associated fat planes.  Salivary glands appear symmetrical.  No mucosal space or prevertebral space swelling.  No significant infiltration in the subcutaneous fatty tissues.  No significant cervical lymphadenopathy.  Focal area of scarring in the posterior right upper lung measuring 14 mm diameter.  This has been present previously without change.  Normal thyroid gland.  Bowel structures appear intact.  The visualized paranasal sinuses are not opacified.  Orbital and facial bones and mandibles appear intact.  IMPRESSION: No focal mass or infiltration identified to account for left neck swelling.   Original Report Authenticated By: Burman Nieves, M.D.     Other results: EKG: Normal sinus rhythm, left axis deviation, non specific T wave inversion in leads in AVR, AVL, V1, and V2  Assessment & Plan by Problem: Mr. Wint  is a 66 year old man with PMH of CAD, and carotid artery disease, presenting with neck pain that is chronic but has not responded to his usual home medications or codeine, but was found to have acute on chronic renal failure  Acute on chronic renal failure. Stage III CKD. Baseline creatinine of 1.40 but has been as high as 3.7 in 8/13. He reports not drinking water for a few days but denies  polyuria, diarrhea, or increased sweating and he is not on a diuretic at home. He reports dizziness but has this problem at baseline. Prerenal azotemia is highly likely the cause for his acute decline in renal function. On physical exam he appears euvolemic, his BUN is elevated to 34 but his BUN:Cr is less than 20. His EF is 65-70% on 11/24/11 and his CXR is negative for pulmonary edema. He does have a 11lb weight gain in the past 3 months with EF of 40% in the past making CHF a possible, although unlikely,cause of his Acute on CRF.  -Inpatient observation status -Telemetry -IVF 156ml/hr -Hold Lisinopril in setting of worsening renal function -urine Na and Cr (for FeNa) -pro-BNP -daily weights -Repeat BMET in AM  Neck pain. His neck pain is chronic and reported as improving at the time of his admission. His CT soft tissue neck without contrast is unremarkable for masses or edema in the left neck. He does report a sore throat of few days of duration which could also explain his neck pain/soreness. Given his extensive history of CAD, including coronary artery disease, hx of MI and CABG, ACS is also considered even though unlikely. His EKG has nonspecific changes but his troponin x1 is negative. He also has carotid artery disease that perhaps could explain possible cramping/acheness of his left neck. In 8/13, his bilateral carotid artery ultrasound showed occlusion of the left internal artery right with 40-59% stenosis of the right ICA. Dr. Hart Rochester saw the pt in 10 and recommended 6 month follow in April for Dupplex scan of carotid artery and CTA of the bilateral femoral artery stents.  -Telemetry -Troponin q6h x3 - ASA -Norco 3-325mg  1-2 tabs q6h PRN -Continue home Zocor 10mg    Paroxysmal A. Fib. On admission he is in sinus rhythm. He has been on anticoagulation with warfarin since 8/13, he is followed by Dr. Myrtis Ser at [facility], and the [facility] for his therapy. His INR is therapeutic  at 2.2.   -Continue warfarin per pharmacy  HTN. BP on admission 101/50. He is on lisinopril 10mg  at home.  -Hold Lisinopril in the setting of Acute on CRF  DVT prophylaxis: on warfarin  Dispo: Disposition is deferred at this time, awaiting improvement of current medical problems. Anticipated discharge in approximately 1 day(s).   The patient does have a current PCP Collier Bullock, Weed, MD), therefore will be requiring [facility] follow-up after discharge.   The patient does not have transportation limitations that hinder transportation to clinic appointments.  Signed: Ky Barban 03/13/2012, 3:32 AM

## 2012-03-13 NOTE — ED Notes (Addendum)
Pt reports  Left sided neck pain x 2-3 yrs. States "the pain drove him to tears. I was taking Codeine for the pain and it didn't go away". Reports hxt of chronic neck pain., hxt hypertension, hxt of heat block in 2005. Reports hxt of 3 strokes. Reports numbness and tingling in feet intermittently. None right now. Has appointment with cardiologist tomorrow "for some kind of test". Pt poor historian.

## 2012-03-13 NOTE — ED Notes (Signed)
Pt A.O. X 4. Unwilling to have IV placed. States "I am just here for my neck. I don't need that. I don't want that". EDP aware.

## 2012-03-13 NOTE — ED Notes (Addendum)
Bed assigned. Pt refusing to let me attempt IV start. Charge RN, Shary Key attempted to start IV. Not successful. RN Jody attempted to start IV. Not successful. IV team paged. Pt aware that room has been assigned, but IV access needs to be obtained prior to transport.

## 2012-03-13 NOTE — ED Notes (Signed)
Line not completed by IV team. 2 IV team  Nursing stuck patient. Not successful.

## 2012-03-13 NOTE — Discharge Summary (Signed)
Internal Medicine Teaching [facility] Discharge Note  Name: Dylan Morgan MRN: [id number] DOB: 1946/01/26 66 y.o.  Date of Admission: 03/13/2012 12:41 AM Date of Discharge: 03/13/2012 Attending Physician: Dr. Eben Burow Discharge Diagnosis: Active Problems:  Occlusion and stenosis of carotid artery without mention of cerebral infarction  Acute on chronic renal failure (baseline Stage III)  Paroxysmal atrial fibrillation  Neck pain, chronic  Discharge Medications:   Medication List     As of 03/14/2012  2:46 PM    TAKE these medications         aspirin 81 MG EC tablet   Take 1 tablet (81 mg total) by mouth daily.      HYDROcodone-acetaminophen 5-325 MG per tablet   Commonly known as: NORCO/VICODIN   Take 2 tablets by mouth every 6 (six) hours as needed for pain.      lisinopril 10 MG tablet   Commonly known as: PRINIVIL,ZESTRIL   Take 10 mg by mouth at bedtime.      multivitamin with minerals tablet   Take 1 tablet by mouth at bedtime.      sildenafil 100 MG tablet   Commonly known as: VIAGRA   Take 100 mg by mouth daily as needed. For erectile dysfunction      simvastatin 10 MG tablet   Commonly known as: ZOCOR   Take 10 mg by mouth at bedtime.      warfarin 5 MG tablet   Commonly known as: COUMADIN   Take 5 mg by mouth at bedtime.         Disposition and follow-up:   Dylan Morgan was discharged from [facility] in stable condition.  At the hospital follow up visit please address: ARF: Cr on admission 1.90-->1.57--consider repeat bmet  Neck pain: chronic. Hx of LICA stenosis.  Will follow up with vascular as outpatient.  Follow-up Appointments: Follow-up Information    Follow up with Elfredia Nevins, MD. On 03/22/2012. (@245pm )    Contact information:   [address] [place] [address] [phone]         Discharge Orders    Future Appointments: Provider: Department: Dept Phone: Center:   03/14/2012  10:30 AM [facility] [phone] None   03/14/2012 11:00 AM [facility] LAB[facility] [phone] None   03/22/2012 2:45 PM Elfredia Nevins, MD [facility] [phone] [facility]   07/02/2012 12:00 PM [facility] [place] [facility] [phone] [facility]   07/02/2012 1:00 PM [facility] Lab 3 [facility] [phone] [facility]   07/02/2012 2:00 PM Pryor Ochoa, MD [facility] [phone] [facility]     Future Orders Please Complete By Expires   Diet - low sodium heart healthy      Increase activity slowly      Call MD for:  severe uncontrolled pain      Call MD for:  difficulty breathing, headache or visual disturbances      Call MD for:  temperature >100.4        Procedures Performed:  Dg Chest 2 View  03/13/2012  *RADIOLOGY REPORT*  Clinical Data: Neck pain.  CHEST - 2 VIEW  Comparison: None.  Findings: Mild cardiomegaly.  Tortuous thoracic aorta.  CABG. Collapse / opacification of the inferior right middle lobe seen on the lateral view.  No airspace disease.  No pleural effusion. Partially visualized stent  graft in the upper abdomen.  IMPRESSION: Cardiomegaly without failure.  Inferior right middle lobe opacity favored to represent discoid atelectasis.  Pneumonia less likely.   Original Report Authenticated By: Andreas Newport, M.D.    Ct Soft Tissue Neck Wo Contrast  03/13/2012  *RADIOLOGY REPORT*  Clinical Data: Left sided neck pain and swelling.  CT NECK WITHOUT CONTRAST  Technique:  Multidetector CT imaging of the neck was performed without intravenous contrast.  Comparison: CT angio of the neck 12/21/2011.  Findings: Postoperative changes in the left orbit consistent with banding.  Vascular calcifications scattered in the cervical carotid arteries.  No evidence of significant displacement of the carotid or  jugular vessels or in the associated fat planes.  Salivary glands appear symmetrical.  No mucosal space or prevertebral space swelling.  No significant infiltration in the subcutaneous fatty tissues.  No significant cervical lymphadenopathy.  Focal area of scarring in the posterior right upper lung measuring 14 mm diameter.  This has been present previously without change.  Normal thyroid gland.  Bowel structures appear intact.  The visualized paranasal sinuses are not opacified.  Orbital and facial bones and mandibles appear intact.  IMPRESSION: No focal mass or infiltration identified to account for left neck swelling.   Original Report Authenticated By: Burman Nieves, M.D.    2D Echo 11/24/11: [facility]* *[facility]* [address] [place], [place] [address] [phone]  ------------------------------------------------------------ Echocardiography  Patient: Dylan, Morgan MR #: [id number] Study Date: 11/24/2011 Gender: M Age: 40 Height: 180.3cm Weight: 110.5kg BSA: 2.38m^2 Pt. Status: Room: 4N19C  PERFORMING [place] ATTENDING Ileana Roup SONOGRAPHER Cathie Beams ORDERING Powers, Marwan cc:  ------------------------------------------------------------ LV EF: 65% - 70%  ------------------------------------------------------------ Indications: Previous study 01/01/2011. TIA 435.9.  ------------------------------------------------------------ History: PMH: History of CVA. Reflux. Acute on chronic renal failure. Carotid artery disease. Depressive disorder. Prediabetes. History of atrial fibrillation. Coronary artery disease. Risk factors: Hypertension.  ------------------------------------------------------------ Study Conclusions  Left ventricle: The cavity size was normal. Wall thickness was increased in a pattern of moderate LVH. Systolic function was vigorous. The estimated ejection fraction was in the range of 65% to  70%. Wall motion was normal; there were no regional wall motion abnormalities. Left ventricular diastolic function parameters were normal.  ------------------------------------------------------------ Labs, prior tests, procedures, and surgery: Coronary artery bypass grafting.  Echocardiography. M-mode, complete 2D, spectral Doppler, and color Doppler. Height: Height: 180.3cm. Height: 71in. Weight: Weight: 110.5kg. Weight: 243.1lb. Body mass index: BMI: 34kg/m^2. Body surface area: BSA: 2.48m^2. Blood pressure: 116/59. Patient status: Inpatient. Location: Bedside.  ------------------------------------------------------------  ------------------------------------------------------------ Left ventricle: The cavity size was normal. Wall thickness was increased in a pattern of moderate LVH. Systolic function was vigorous. The estimated ejection fraction was in the range of 65% to 70%. Wall motion was normal; there were no regional wall motion abnormalities. The transmitral flow pattern was normal. The deceleration time of the early transmitral flow velocity was normal. The pulmonary vein flow pattern was normal. The tissue Doppler parameters were normal. Left ventricular diastolic function parameters were normal.  ------------------------------------------------------------ Aortic valve: Structurally normal valve. Cusp separation was normal. Doppler: Transvalvular velocity was within the normal range. There was no stenosis. No regurgitation.  ------------------------------------------------------------ Aorta: Ascending aorta: The ascending aorta was mildly dilated.  ------------------------------------------------------------ Mitral valve: Structurally normal valve. Leaflet separation was normal. Doppler: Transvalvular velocity was within the normal range. There was no evidence for stenosis. No regurgitation.  ------------------------------------------------------------ Left  atrium: The atrium was normal in size.  ------------------------------------------------------------ Right  ventricle: The cavity size was normal. Wall thickness was normal. Systolic function was normal.  ------------------------------------------------------------ Pulmonic valve: The valve appears to be grossly normal. Doppler: No significant regurgitation.  ------------------------------------------------------------ Right atrium: The atrium was normal in size.  ------------------------------------------------------------ Pericardium: There was no pericardial effusion.  ------------------------------------------------------------  2D measurements Normal Doppler measurements Normal Left ventricle Left ventricle LVID ED, 47.7 mm 43-52 Ea, lat ann, 12 cm/s ------ chord, tiss DP PLAX E/Ea, lat 5.0 ------ LVID ES, 28.6 mm 23-38 ann, tiss DP 6 chord, Ea, med ann, 7.5 cm/s ------ PLAX tiss DP 1 FS, chord, 40 % >29 E/Ea, med 8.0 ------ PLAX ann, tiss DP 8 LVPW, ED 14.9 mm ------ Mitral valve IVS/LVPW 0.91 <1.3 Peak E vel 60. cm/s ------ ratio, ED 7 Ventricular septum Peak A vel 57. cm/s ------ IVS, ED 13.5 mm ------ 7 Aorta Deceleration 192 ms 150-23 Root diam, 41 mm ------ time 0 ED Peak E/A 1.1 ------ Left atrium ratio AP dim 35 mm ------ Right ventricle AP dim 1.47 cm/m^2 <2.2 Sa vel, lat 12. cm/s ------ index ann, tiss DP 7  ------------------------------------------------------------ Prepared and Electronically Authenticated by  Cassell Clement 2013-08-23T13:32:42.750  Admission HPI: Dylan Morgan is a 66 year-old man with PMH significant for CKD stage 3, AAA, carotid artery disease, CAD with CABG in 2004, TIA, cellulitis of RLE presents who presents to the [facility] ED for evaluation of left neck pain that has been present for the past 3 years. He explains that he usually takes Advil or aspirin for this pain. He took Advil 200 mg yesterday but the pain has persisted. He has taken  codeine (prescribed to him during a previous ED visit) but it has not alleviated his neck pain. The pain is described as an ache that is worse with neck movement; at its worse it is a 7/10 but currently is improved to a 6/10. He also reports a sore throat for a couple of days with a cough productive of scant white-green sputum. He denies fever or chills. He reports drinking less water yesterday as he often "forgets" to drink water. He reports feeling dizzy.  He specifically denies chest pain. He states that he felt arm numbness and chest pressure when he had his MI years ago.  He denies headache, shortness of breath, abdominal pain, polyuria, increased urinary frequency or diarrhea, hematochezia, or hematuria.  Of note, he has an appointment with the Vascular Lab on 12/12 at 10:30 AM for bilateral ABI study.   Hospital Course by problem list:   Acute on chronic renal failure Stage III--presented with Cr. 1.90.  Baseline creatinine of 1.40 but has been as high as 3.7 in August 2013.  Prerenal azotemia thought to be likely the cause for acute decline in renal function.  Improved with IVF and held ACEi in setting of renal function.  Cr on discharge 1.57.  Recommended to follow up in [facility] with pcp.     Paroxysmal atrial fibrillation Hx--on anticoagulation with warfain since August 2013.  Followed by Dr. Myrtis Ser cardiology and coumadin clinic.  INR during admission 2.21.  Will need to continue to follow up in coumadin clinic.     Neck pain, chronic--thought to be likely secondary to acute exacerbation of chronic musculoskeletal neck pain.  Reproducible on palpation, worse with movement, and improved with cervical collar.  Recommended to follow up with pcp in opc.     Hx of occulusion and stenosis of carotid artery--Had appointment to follow up with vascular on discharge.  In 8/13, his bilateral carotid artery ultrasound showed occlusion of the left internal artery with 40-59% stenosis of the right ICA. Dr.  Hart Rochester has evaluated Dylan Morgan in the past and recommended 6 month follow in April for Dupplex scan of carotid artery and CTA of the bilateral femoral artery stents.  Recommended to continue to follow up with pcp and vascular as needed and scheduled.     Discharge Vitals:  BP 120/68  Pulse 70  Temp 98.7 F (37.1 C) (Oral)  Resp 18  Ht 5\' 11"  (1.803 m)  Wt 253 lb 4.9 oz (114.9 kg)  BMI 35.33 kg/m2  SpO2 97%  Discharge Labs:  Results for orders placed during the hospital encounter of 03/13/12 (from the past 24 hour(s))  CBC WITH DIFFERENTIAL     Status: Normal   Collection Time   03/12/12 11:45 PM      Component Value Range   WBC 8.0  4.0 - 10.5 K/uL   RBC 4.57  4.22 - 5.81 MIL/uL   Hemoglobin 14.0  13.0 - 17.0 g/dL   HCT 16.1  09.6 - 04.5 %   MCV 89.9  78.0 - 100.0 fL   MCH 30.6  26.0 - 34.0 pg   MCHC 34.1  30.0 - 36.0 g/dL   RDW 40.9  81.1 - 91.4 %   Platelets 174  150 - 400 K/uL   Neutrophils Relative 60  43 - 77 %   Neutro Abs 4.8  1.7 - 7.7 K/uL   Lymphocytes Relative 28  12 - 46 %   Lymphs Abs 2.2  0.7 - 4.0 K/uL   Monocytes Relative 10  3 - 12 %   Monocytes Absolute 0.8  0.1 - 1.0 K/uL   Eosinophils Relative 1  0 - 5 %   Eosinophils Absolute 0.1  0.0 - 0.7 K/uL   Basophils Relative 0  0 - 1 %   Basophils Absolute 0.0  0.0 - 0.1 K/uL  COMPREHENSIVE METABOLIC PANEL     Status: Abnormal   Collection Time   03/12/12 11:45 PM      Component Value Range   Sodium 139  135 - 145 mEq/L   Potassium 5.1  3.5 - 5.1 mEq/L   Chloride 100  96 - 112 mEq/L   CO2 26  19 - 32 mEq/L   Glucose, Bld 116 (*) 70 - 99 mg/dL   BUN 34 (*) 6 - 23 mg/dL   Creatinine, Ser 7.82 (*) 0.50 - 1.35 mg/dL   Calcium 9.7  8.4 - 95.6 mg/dL   Total Protein 7.8  6.0 - 8.3 g/dL   Albumin 3.7  3.5 - 5.2 g/dL   AST 25  0 - 37 U/L   ALT 18  0 - 53 U/L   Alkaline Phosphatase 84  39 - 117 U/L   Total Bilirubin 0.4  0.3 - 1.2 mg/dL   GFR calc non Af Amer 35 (*) >90 mL/min   GFR calc Af Amer 40 (*) >90  mL/min  PRO B NATRIURETIC PEPTIDE     Status: Abnormal   Collection Time   03/12/12 11:45 PM      Component Value Range   Pro B Natriuretic peptide (BNP) 165.7 (*) 0 - 125 pg/mL  POCT I-STAT TROPONIN I     Status: Normal   Collection Time   03/13/12 12:00 AM      Component Value Range   Troponin i, poc 0.00  0.00 - 0.08 ng/mL   Comment  3           PROTIME-INR     Status: Abnormal   Collection Time   03/13/12  1:54 AM      Component Value Range   Prothrombin Time 23.6 (*) 11.6 - 15.2 seconds   INR 2.21 (*) 0.00 - 1.49  RAPID STREP SCREEN     Status: Normal   Collection Time   03/13/12  3:25 AM      Component Value Range   Streptococcus, Group A Screen (Direct) NEGATIVE  NEGATIVE  BASIC METABOLIC PANEL     Status: Abnormal   Collection Time   03/13/12  8:55 AM      Component Value Range   Sodium 137  135 - 145 mEq/L   Potassium 4.3  3.5 - 5.1 mEq/L   Chloride 101  96 - 112 mEq/L   CO2 25  19 - 32 mEq/L   Glucose, Bld 138 (*) 70 - 99 mg/dL   BUN 31 (*) 6 - 23 mg/dL   Creatinine, Ser 4.09 (*) 0.50 - 1.35 mg/dL   Calcium 9.6  8.4 - 81.1 mg/dL   GFR calc non Af Amer 44 (*) >90 mL/min   GFR calc Af Amer 51 (*) >90 mL/min  TROPONIN I     Status: Normal   Collection Time   03/13/12  8:55 AM      Component Value Range   Troponin I <0.30  <0.30 ng/mL   Signed: Darden Palmer 03/14/2012, 3:00 PM   Time Spent on Discharge: 35 minutes Services Ordered on Discharge: none Equipment Ordered on Discharge: none

## 2012-03-13 NOTE — ED Notes (Signed)
IV team at bedside 

## 2012-03-13 NOTE — H&P (Signed)
Internal Medicine Attending Admission Note Date: 03/13/2012  Patient name: Dylan Morgan Medical record number: [id number] Date of birth: 1946/03/06 Age: 66 y.o. Gender: male  I saw and evaluated the patient. I reviewed the resident's note and I agree with the resident's findings and plan as documented in the resident's note.  Chief Complaint(s): Neck pain  History - key components related to admission: Patient was apparently well on the day of admission when he suddenly started having increased neck pain. Patient has chronic neck pain for last 8 years and currently on Vicodin for pain control. Patient's baseline pain 1-2 out of 10. His pain was 6/10, nonradiating, worse with movement, throbbing, not related to exertion, relieved with cervical collar, not associated with sweating, palpitations or shortness of breath.  Patient has left internal carotid artery stenosis and was scared that this may be causing the pain and decided to come to ER. Patient denies any neck pain at the time of my examination.  10 point review of system was negative.  Physical Exam - key components related to admission:  Filed Vitals:   03/13/12 0345 03/13/12 0347 03/13/12 0721 03/13/12 0800  BP: 112/56 101/50 115/57 120/68  Pulse: 80 70 75 70  Temp:   98.4 F (36.9 C) 98.7 F (37.1 C)  TempSrc:   Oral   Resp:   16 18  Height:    5\' 11"  (1.803 m)  Weight:    253 lb 4.9 oz (114.9 kg)  SpO2:   97% 97%  BP 120/68  Pulse 70  Temp 98.7 F (37.1 C) (Oral)  Resp 18  Ht 5\' 11"  (1.803 m)  Wt 253 lb 4.9 oz (114.9 kg)  BMI 35.33 kg/m2  SpO2 97%  General Appearance:    Alert, cooperative, no distress, appears stated age  Head:    Normocephalic, without obvious abnormality, atraumatic  Eyes:    PERRL, conjunctiva/corneas clear, EOM's intact, fundi    benign, both eyes       Nose:   Nares normal, septum midline, mucosa normal, no drainage   or sinus tenderness  Throat:   Lips, mucosa, and tongue normal; teeth and  gums normal  Neck:   Supple, symmetrical, trachea midline, no adenopathy;       thyroid:  No enlargement/tenderness/nodules; no carotid   bruit or JVD, no pain on palpation   Back:     Symmetric, no curvature, ROM normal, no CVA tenderness  Lungs:     Clear to auscultation bilaterally, respirations unlabored  Chest wall:    No tenderness or deformity  Heart:    Regular rate and rhythm, S1 and S2 normal, no murmur, rub   or gallop  Abdomen:     Soft, non-tender, bowel sounds active all four quadrants,    no masses, no organomegaly  Extremities:   Extremities normal, atraumatic, no cyanosis or edema  Pulses:   2+ and symmetric all extremities  Skin:   Skin color, texture, turgor normal, no rashes or lesions  Lymph nodes:   Cervical, supraclavicular, and axillary nodes normal  Neurologic:   CNII-XII intact. Normal strength, sensation and reflexes      throughout     Lab results:   Basic Metabolic Panel:  Basename 03/13/12 0855 03/12/12 2345  NA 137 139  K 4.3 5.1  CL 101 100  CO2 25 26  GLUCOSE 138* 116*  BUN 31* 34*  CREATININE 1.57* 1.90*  CALCIUM 9.6 9.7  MG -- --  PHOS -- --  Liver Function Tests:  [address] [facility] 03/12/12 2345  AST 25  ALT 18  ALKPHOS 84  BILITOT 0.4  PROT 7.8  ALBUMIN 3.7   CBC:  Basename 03/12/12 2345  WBC 8.0  NEUTROABS 4.8  HGB 14.0  HCT 41.1  MCV 89.9  PLT 174   Cardiac Enzymes:  Basename 03/13/12 0855  CKTOTAL --  CKMB --  CKMBINDEX --  TROPONINI <0.30   Hemoglobin A1C:  Basename 03/12/12 2345  HGBA1C 5.7*   Coagulation:  Basename 03/13/12 0154  INR 2.21*     Imaging results:  Dg Chest 2 View  03/13/2012  *RADIOLOGY REPORT*  Clinical Data: Neck pain.  CHEST - 2 VIEW  Comparison: None.  Findings: Mild cardiomegaly.  Tortuous thoracic aorta.  CABG. Collapse / opacification of the inferior right middle lobe seen on the lateral view.  No airspace disease.  No pleural effusion. Partially visualized stent graft in the upper  abdomen.  IMPRESSION: Cardiomegaly without failure.  Inferior right middle lobe opacity favored to represent discoid atelectasis.  Pneumonia less likely.   Original Report Authenticated By: Andreas Newport, M.D.    Ct Soft Tissue Neck Wo Contrast  03/13/2012  *RADIOLOGY REPORT*  Clinical Data: Left sided neck pain and swelling.  CT NECK WITHOUT CONTRAST  Technique:  Multidetector CT imaging of the neck was performed without intravenous contrast.  Comparison: CT angio of the neck 12/21/2011.  Findings: Postoperative changes in the left orbit consistent with banding.  Vascular calcifications scattered in the cervical carotid arteries.  No evidence of significant displacement of the carotid or jugular vessels or in the associated fat planes.  Salivary glands appear symmetrical.  No mucosal space or prevertebral space swelling.  No significant infiltration in the subcutaneous fatty tissues.  No significant cervical lymphadenopathy.  Focal area of scarring in the posterior right upper lung measuring 14 mm diameter.  This has been present previously without change.  Normal thyroid gland.  Bowel structures appear intact.  The visualized paranasal sinuses are not opacified.  Orbital and facial bones and mandibles appear intact.  IMPRESSION: No focal mass or infiltration identified to account for left neck swelling.   Original Report Authenticated By: Burman Nieves, M.D.     Other results: EKG: 75bpm, left axis deviation, sinus rhythm, no PR prolongation or QRS widening, No ST changes seen, T wave inversions in lead V1 and V2 which are non specific in the absence of any symptoms.  Assessment & Plan by Problem:  Active Problems:  Acute on chronic renal failure (baseline Stage II)  Paroxysmal atrial fibrillation  Hx of CABG  AAA (abdominal aortic aneurysm)  Occlusion and stenosis of carotid artery without mention of cerebral infarction  Neck pain, chronic  The patient was initially admitted by the ED  physician with chief concerns for the neck pain being related to his coronary artery disease. Given that patient's pain was reproducible on palpation, worse with neck movement and was relieved with cervical collar makes me think that this was an acute exacerbation of his chronic musculoskeletal neck pain. Patient's cardiac enzymes have been negative during the hospital stay so far and his EKG has not shown any changes as compared to his previous EKGs. I feel safe to discharge this patient home as this pain seems to be noncardiac in origin at this time. Patient has a followup appointment with his vascular surgeon tomorrow morning. Patient will followup with primary care physician within one week. He was advised to call his primary care physician for  exacerbation of his neck pain in the future. Patient and his wife understand the plan and ready to be discharged at this time.  Lars Mage MD Faculty-Internal Medicine Residency Program Pager [phone] 4:01 PM 03/13/2012

## 2012-03-13 NOTE — ED Notes (Signed)
IV team page returned.

## 2012-03-13 NOTE — Progress Notes (Signed)
ANTICOAGULATION CONSULT NOTE - Initial Consult  Pharmacy Consult for Coumadin Indication: atrial fibrillation, hx CVA  Allergies  Allergen Reactions  . Diltiazem Hcl Itching  . Oxycodone-Acetaminophen Other (See Comments)    vertigo    Patient Measurements: Height: 5\' 11"  (180.3 cm) Weight: 253 lb 4.9 oz (114.9 kg) IBW/kg (Calculated) : 75.3   Vital Signs: Temp: 98.7 F (37.1 C) (12/11 0800) Temp src: Oral (12/11 0721) BP: 120/68 mmHg (12/11 0800) Pulse Rate: 70  (12/11 0800)  Labs:  Basename 03/13/12 0855 03/13/12 0154 03/12/12 2345  HGB -- -- 14.0  HCT -- -- 41.1  PLT -- -- 174  APTT -- -- --  LABPROT -- 23.6* --  INR -- 2.21* --  HEPARINUNFRC -- -- --  CREATININE 1.57* -- 1.90*  CKTOTAL -- -- --  CKMB -- -- --  TROPONINI <0.30 -- --    Estimated Creatinine Clearance: 58 ml/min (by C-G formula based on Cr of 1.57).   Medical History: Past Medical History  Diagnosis Date  . CAD (coronary artery disease) CABG in 2004    Nuclear, October, 2010, no ischemia, EF 71%  . GERD (gastroesophageal reflux disease)   . Hypertension   . Hyperlipidemia   . Renal insufficiency   . Cellulitis and abscess of leg     right leg  . Tobacco abuse   . Chronic back pain   . Chronic neck pain   . Seborrheic dermatitis of scalp   . Abnormal LFTs     Hx of abnormal LFT's  . Bradycardia   . AAA (abdominal aortic aneurysm)     November 2010, Stenting, November, 2010  . Paroxysmal atrial fibrillation 02/22/2009    diltiazem po started but discontinued due to side effects.  . Ejection fraction     EF 40% in the past  /   EF 55-65% echo, November, 2006  . Hx of CABG     2004  . Carotid artery disease     Doppler, October, 2012, and old LICA occlusion , RICA no significant abnormality  . Arthritis   . Prediabetes   . CVA (cerebral infarction)     R Carona radiata stroke 08/16/2011  . Stroke 11/26/11   Assessment:   66 year old man admitted with neck pain.   To continue  Coumadin for afib and hx CVA.   INR is therapeutic on reported home regimen of Coumadin 5 mg daily. Previously taking 2.5 mg on Mondays and Fridays. Noted that he uses Advil or Aspirin prn for neck pain.   He is currently asleep.  Goal of Therapy:  INR 2-3 Monitor platelets by anticoagulation protocol: Yes   Plan:   Coumadin 5 mg today per usual home dose.  Daily PT/INR while in the hospital.  Will try to speak with him later today regarding frequency of Advil/Aspirin use at home.  Dennie Fetters, [place] Pager: [phone] 03/13/2012,11:29 AM   Addendum:  - Going home.  We discussed the potential risks of Advil and Aspirin while on Coumadin. He relates taking Advil, with food or milk, up to a few times a week, without GI irritation. He also relates no longer taking Aspirin prn, due to prior recommendations to avoid while on Coumadin.  Hilarie Fredrickson, RPh 12:50pm

## 2012-03-13 NOTE — ED Provider Notes (Signed)
History     CSN: [id number]  Arrival date & time 03/12/12  2315   First MD Initiated Contact with Patient 03/13/12 0104      Chief Complaint  Patient presents with  . Neck Pain    (Consider location/radiation/quality/duration/timing/severity/associated sxs/prior treatment) Patient is a 66 y.o. male presenting with neck pain. The history is provided by the patient. No language interpreter was used.  Neck Pain  This is a recurrent problem. The current episode started 12 to 24 hours ago. The problem occurs constantly. The problem has not changed since onset.The pain is associated with nothing. The pain is present in the left side. The pain is severe. Exacerbated by: nothing. The pain is the same all the time. Pertinent negatives include no syncope, no numbness, no headaches and no weakness. He has tried nothing for the symptoms. The treatment provided no relief.    Past Medical History  Diagnosis Date  . CAD (coronary artery disease) CABG in 2004    Nuclear, October, 2010, no ischemia, EF 71%  . GERD (gastroesophageal reflux disease)   . Hypertension   . Hyperlipidemia   . Renal insufficiency   . Cellulitis and abscess of leg     right leg  . Tobacco abuse   . Chronic back pain   . Chronic neck pain   . Seborrheic dermatitis of scalp   . Abnormal LFTs     Hx of abnormal LFT's  . Bradycardia   . AAA (abdominal aortic aneurysm)     November 2010, Stenting, November, 2010  . Paroxysmal atrial fibrillation 02/22/2009    diltiazem po started but discontinued due to side effects.  . Ejection fraction     EF 40% in the past  /   EF 55-65% echo, November, 2006  . Hx of CABG     2004  . Carotid artery disease     Doppler, October, 2012, and old LICA occlusion , RICA no significant abnormality  . Arthritis   . Prediabetes   . CVA (cerebral infarction)     R Carona radiata stroke 08/16/2011  . Stroke 11/26/11    Past Surgical History  Procedure Date  . Coronary artery bypass  graft 2004  . Rotator cuff repair   . Abdominal aortic aneurysm repair 2010    Aortic stent repair    Family History  Problem Relation Age of Onset  . Diabetes Mother   . Stroke Father   . Cancer Sister     thyroid cancer  . Heart disease Brother     Coronary artery disease    History  Substance Use Topics  . Smoking status: Former Smoker    Types: Cigarettes    Quit date: 02/03/2010  . Smokeless tobacco: Never Used  . Alcohol Use: No      Review of Systems  Constitutional: Negative for fever.  HENT: Positive for sore throat and neck pain. Negative for voice change.   Respiratory: Positive for cough.   Cardiovascular: Negative for syncope.  Gastrointestinal: Negative for abdominal pain.  Neurological: Negative for weakness, numbness and headaches.  All other systems reviewed and are negative.    Allergies  Diltiazem hcl and Oxycodone-acetaminophen  Home Medications   Current Outpatient Rx  Name  Route  Sig  Dispense  Refill  . HYDROCODONE-ACETAMINOPHEN 5-325 MG PO TABS   Oral   Take 2 tablets by mouth every 6 (six) hours as needed for pain.   12 tablet   0   .  LISINOPRIL 10 MG PO TABS   Oral   Take 10 mg by mouth at bedtime.         . MULTI-VITAMIN/MINERALS PO TABS   Oral   Take 1 tablet by mouth at bedtime.          Marland Kitchen SILDENAFIL CITRATE 100 MG PO TABS   Oral   Take 100 mg by mouth daily as needed. For erectile dysfunction          . SIMVASTATIN 10 MG PO TABS   Oral   Take 10 mg by mouth at bedtime.         . WARFARIN SODIUM 5 MG PO TABS   Oral   Take 5 mg by mouth at bedtime.           BP 112/64  Pulse 69  Temp 98.5 F (36.9 C) (Oral)  Resp 20  SpO2 95%  Physical Exam  Constitutional: He is oriented to person, place, and time. He appears well-developed and well-nourished. No distress.  HENT:  Head: Normocephalic and atraumatic.  Mouth/Throat: Oropharynx is clear and moist.  Eyes: Conjunctivae normal are normal. Pupils are  equal, round, and reactive to light.  Neck: Normal range of motion. Neck supple. No JVD present. No tracheal deviation present. No thyromegaly present.  Cardiovascular: Normal rate, regular rhythm and intact distal pulses.   Pulmonary/Chest: Effort normal and breath sounds normal. He has no wheezes. He has no rales.  Abdominal: Soft. Bowel sounds are normal. There is no tenderness. There is no rebound and no guarding.  Musculoskeletal: Normal range of motion.  Neurological: He is alert and oriented to person, place, and time. He has normal reflexes.  Skin: Skin is warm and dry.  Psychiatric: He has a normal mood and affect.    ED Course  Procedures (including critical care time)  Labs Reviewed  COMPREHENSIVE METABOLIC PANEL - Abnormal; Notable for the following:    Glucose, Bld 116 (*)     BUN 34 (*)     Creatinine, Ser 1.90 (*)     GFR calc non Af Amer 35 (*)     GFR calc Af Amer 40 (*)     All other components within normal limits  CBC WITH DIFFERENTIAL  POCT I-STAT TROPONIN I  RAPID STREP SCREEN  PROTIME-INR   Dg Chest 2 View  03/13/2012  *RADIOLOGY REPORT*  Clinical Data: Neck pain.  CHEST - 2 VIEW  Comparison: None.  Findings: Mild cardiomegaly.  Tortuous thoracic aorta.  CABG. Collapse / opacification of the inferior right middle lobe seen on the lateral view.  No airspace disease.  No pleural effusion. Partially visualized stent graft in the upper abdomen.  IMPRESSION: Cardiomegaly without failure.  Inferior right middle lobe opacity favored to represent discoid atelectasis.  Pneumonia less likely.   Original Report Authenticated By: Andreas Newport, M.D.      No diagnosis found.    MDM   Date: 03/13/2012  Rate: 74  Rhythm: normal sinus rhythm  QRS Axis: normal  Intervals: normal  ST/T Wave abnormalities: nonspecific ST changes  Conduction Disutrbances:none  Narrative Interpretation:   Old EKG Reviewed: changes noted       New EKG changes with neck pain will  admit for rule out   Alyzabeth Pontillo Smitty Cords, MD 03/13/12 [phone]

## 2012-03-14 ENCOUNTER — Ambulatory Visit (HOSPITAL_COMMUNITY)
Admission: RE | Admit: 2012-03-14 | Discharge: 2012-03-14 | Disposition: A | Discharge status: Home / Self Care | Payer: Medicare Other | Source: Ambulatory Visit | Attending: Radiation Oncology | Admitting: Radiation Oncology

## 2012-03-14 DIAGNOSIS — M7989 Other specified soft tissue disorders: Secondary | ICD-10-CM

## 2012-03-14 DIAGNOSIS — M79605 Pain in left leg: Secondary | ICD-10-CM

## 2012-03-14 DIAGNOSIS — M79609 Pain in unspecified limb: Secondary | ICD-10-CM

## 2012-03-14 DIAGNOSIS — M79604 Pain in right leg: Secondary | ICD-10-CM

## 2012-03-14 NOTE — Progress Notes (Signed)
VASCULAR LAB PRELIMINARY  PRELIMINARY  PRELIMINARY  PRELIMINARY  Bilateral lower extremity venous duplex completed.    Preliminary report:  Bilateral:  No evidence of DVT, superficial thrombosis, or Baker's Cyst.   Dylan Morgan, RVS 03/14/2012, 3:38 PM

## 2012-03-14 NOTE — Progress Notes (Signed)
VASCULAR LAB PRELIMINARY  PRELIMINARY  PRELIMINARY  PRELIMINARY  VASCULAR LAB PRELIMINARY  ARTERIAL  ABI completed:    RIGHT    LEFT    PRESSURE WAVEFORM  PRESSURE WAVEFORM  BRACHIAL 106 Triphasic BRACHIAL 133 Triphasic         AT 109 Biphasic AT 124 Biphasic  PT 123 Biphasic PT 139 Biphasic                  RIGHT LEFT  ABI 0.92 1.05   Strong Biphasic Doppler waveforms and ABIs are within normal limits bilaterally. No significant changes since study of 04/2009 performed at [facility]   Dylan Morgan, RVS 03/14/2012, 3:41 PM

## 2012-03-18 ENCOUNTER — Emergency Department (HOSPITAL_COMMUNITY)
Admission: EM | Admit: 2012-03-18 | Discharge: 2012-03-18 | Disposition: A | Discharge status: Home / Self Care | Payer: Medicare Other | Attending: Emergency Medicine | Admitting: Emergency Medicine

## 2012-03-18 ENCOUNTER — Emergency Department (HOSPITAL_COMMUNITY): Payer: Medicare Other

## 2012-03-18 DIAGNOSIS — M2390 Unspecified internal derangement of unspecified knee: Secondary | ICD-10-CM | POA: Insufficient documentation

## 2012-03-18 DIAGNOSIS — Z8673 Personal history of transient ischemic attack (TIA), and cerebral infarction without residual deficits: Secondary | ICD-10-CM | POA: Insufficient documentation

## 2012-03-18 DIAGNOSIS — Z87891 Personal history of nicotine dependence: Secondary | ICD-10-CM | POA: Insufficient documentation

## 2012-03-18 DIAGNOSIS — M2391 Unspecified internal derangement of right knee: Secondary | ICD-10-CM

## 2012-03-18 DIAGNOSIS — M25469 Effusion, unspecified knee: Secondary | ICD-10-CM | POA: Insufficient documentation

## 2012-03-18 DIAGNOSIS — Y9389 Activity, other specified: Secondary | ICD-10-CM | POA: Insufficient documentation

## 2012-03-18 DIAGNOSIS — Y929 Unspecified place or not applicable: Secondary | ICD-10-CM | POA: Insufficient documentation

## 2012-03-18 DIAGNOSIS — I2510 Atherosclerotic heart disease of native coronary artery without angina pectoris: Secondary | ICD-10-CM | POA: Insufficient documentation

## 2012-03-18 DIAGNOSIS — E785 Hyperlipidemia, unspecified: Secondary | ICD-10-CM | POA: Insufficient documentation

## 2012-03-18 DIAGNOSIS — Z7982 Long term (current) use of aspirin: Secondary | ICD-10-CM | POA: Insufficient documentation

## 2012-03-18 DIAGNOSIS — Z8679 Personal history of other diseases of the circulatory system: Secondary | ICD-10-CM | POA: Insufficient documentation

## 2012-03-18 DIAGNOSIS — G8929 Other chronic pain: Secondary | ICD-10-CM | POA: Insufficient documentation

## 2012-03-18 DIAGNOSIS — W010XXA Fall on same level from slipping, tripping and stumbling without subsequent striking against object, initial encounter: Secondary | ICD-10-CM | POA: Insufficient documentation

## 2012-03-18 DIAGNOSIS — I10 Essential (primary) hypertension: Secondary | ICD-10-CM | POA: Insufficient documentation

## 2012-03-18 DIAGNOSIS — Z87448 Personal history of other diseases of urinary system: Secondary | ICD-10-CM | POA: Insufficient documentation

## 2012-03-18 MED ORDER — HYDROCODONE-ACETAMINOPHEN 5-325 MG PO TABS: 2.0000 | ORAL_TABLET | Freq: Four times a day (QID) | ORAL | Status: DC | PRN

## 2012-03-18 NOTE — ED Provider Notes (Signed)
History   This chart was scribed for Ward Givens, MD by Gerlean Ren, ED Scribe. This patient was seen in room TR10C/TR10C and the patient's care was started at 5:39 PM    CSN: [id number]  Arrival date & time 03/18/12  1542   First MD Initiated Contact with Patient 03/18/12 1642      Chief Complaint  Patient presents with  . Knee Pain     The history is provided by the patient. No language interpreter was used.   Dylan Morgan is a 66 y.o. male with a h/o CAD, HTN, CVA who presents to the Emergency Department complaining of constant, shooting right knee pain with sudden onset 4 days ago after slipping and falling with impact of his right knee onto the concrete ground.  Pain is improved by rest and knee immobilzer that pt has been wearing (borrowed from a relative) and worsened by movement.  Pt denies any twisting of the knee.  Pt states he is able to ambulate with a limp.  Pt is a former smoker and denies alcohol use.  PCP is [facility] Cardiologist Dr Myrtis Ser No orthopedist.  Past Medical History  Diagnosis Date  . CAD (coronary artery disease) CABG in 2004    Nuclear, October, 2010, no ischemia, EF 71%  . GERD (gastroesophageal reflux disease)   . Hypertension   . Hyperlipidemia   . Renal insufficiency   . Cellulitis and abscess of leg     right leg  . Tobacco abuse   . Chronic back pain   . Chronic neck pain   . Seborrheic dermatitis of scalp   . Abnormal LFTs     Hx of abnormal LFT's  . Bradycardia   . AAA (abdominal aortic aneurysm)     November 2010, Stenting, November, 2010  . Paroxysmal atrial fibrillation 02/22/2009    diltiazem po started but discontinued due to side effects.  . Ejection fraction     EF 40% in the past  /   EF 55-65% echo, November, 2006  . Hx of CABG     2004  . Carotid artery disease     Doppler, October, 2012, and old LICA occlusion , RICA no significant abnormality  . Arthritis   . Prediabetes   . CVA (cerebral infarction)     R Carona  radiata stroke 08/16/2011  . Stroke 11/26/11    Past Surgical History  Procedure Date  . Coronary artery bypass graft 2004  . Rotator cuff repair   . Abdominal aortic aneurysm repair 2010    Aortic stent repair    Family History  Problem Relation Age of Onset  . Diabetes Mother   . Stroke Father   . Cancer Sister     thyroid cancer  . Heart disease Brother     Coronary artery disease    History  Substance Use Topics  . Smoking status: Former Smoker    Types: Cigarettes    Quit date: 02/03/2010  . Smokeless tobacco: Never Used  . Alcohol Use: No   Lives at home Lives with son  Review of Systems  Skin: Negative for wound.       Right knee pain.  Neurological: Negative for weakness and numbness.    Allergies  Diltiazem hcl and Oxycodone-acetaminophen  Home Medications   Current Outpatient Rx  Name  Route  Sig  Dispense  Refill  . ASPIRIN 81 MG PO TBEC   Oral   Take 1 tablet (81 mg total)  by mouth daily.         Marland Kitchen HYDROCODONE-ACETAMINOPHEN 5-325 MG PO TABS   Oral   Take 2 tablets by mouth every 6 (six) hours as needed for pain.   12 tablet   0   . LISINOPRIL 10 MG PO TABS   Oral   Take 10 mg by mouth at bedtime.         . MULTI-VITAMIN/MINERALS PO TABS   Oral   Take 1 tablet by mouth at bedtime.          Marland Kitchen SILDENAFIL CITRATE 100 MG PO TABS   Oral   Take 100 mg by mouth daily as needed. For erectile dysfunction          . SIMVASTATIN 10 MG PO TABS   Oral   Take 10 mg by mouth at bedtime.         . WARFARIN SODIUM 5 MG PO TABS   Oral   Take 5 mg by mouth at bedtime.           ED Triage Vitals  Enc Vitals Group     BP 03/18/12 1554 126/87 mmHg     Pulse Rate 03/18/12 1554 80      Resp 03/18/12 1554 16      Temp 03/18/12 1554 99 F (37.2 C)     Temp src 03/18/12 1554 Oral     SpO2 03/18/12 1554 96 %     Weight --      Height --      Head Cir --      Peak Flow --      Pain Score 03/18/12 1555 Nine     Pain Loc --      Pain  Edu? --      Excl. in GC? --    Vital signs normal     Physical Exam  Nursing note and vitals reviewed. Constitutional: He is oriented to person, place, and time. He appears well-developed and well-nourished.  Non-toxic appearance. He does not appear ill. No distress.  HENT:  Head: Normocephalic and atraumatic.  Right Ear: External ear normal.  Left Ear: External ear normal.  Nose: Nose normal. No mucosal edema or rhinorrhea.  Mouth/Throat: Mucous membranes are normal. No dental abscesses or uvula swelling.  Eyes: Conjunctivae normal and EOM are normal. Pupils are equal, round, and reactive to light.  Neck: Normal range of motion and full passive range of motion without pain. Neck supple.  Pulmonary/Chest: Effort normal. No respiratory distress. He has no rhonchi. He exhibits no crepitus.  Abdominal: Normal appearance.  Musculoskeletal:       1cm abrasion on right anterior knee, no redness, drainage to suggest infection, tenderness over right knee cap with crepitance, moderate effusion, non-tender over bilateral joint spaces, no pain on stressing the collateral ligaments.  Neurological: He is alert and oriented to person, place, and time. He has normal strength. No cranial nerve deficit.  Skin: Skin is warm, dry and intact. No rash noted. No erythema. No pallor.  Psychiatric: He has a normal mood and affect. His speech is normal and behavior is normal. His mood appears not anxious.    ED Course  Procedures (including critical care time) DIAGNOSTIC STUDIES: Oxygen Saturation is 96% on room air, adequate by my interpretation.    COORDINATION OF CARE: 5:46 PM- Patient informed of clinical course, understands medical decision-making process, and agrees with plan.  Pt placed in knee immobilizer, they have a walker at home.  DIL  requests he go to Dr Thurston Hole, who has seen patient's son and his DIL.  Dg Knee Complete 4 Views Right  03/18/2012  *RADIOLOGY REPORT*  Clinical Data:  66 year old male status post fall with pain, painful weightbearing.  RIGHT KNEE - COMPLETE 4+ VIEW  Comparison: None.  Findings: Postoperative changes to the medial right lower extremity compatible with prior vein harvest.  Anterior soft tissue swelling. Moderate suprapatellar joint effusion.  Patella appears intact. Mild tricompartmental degenerative spurring.  No acute fracture identified.  IMPRESSION: Moderate joint effusion and anterior soft tissue swelling.  No acute fracture identified.   Original Report Authenticated By: Erskine Speed, M.D.      1. Internal derangement of right knee     New Prescriptions   HYDROCODONE-ACETAMINOPHEN (NORCO/VICODIN) 5-325 MG PER TABLET    Take 2 tablets by mouth every 6 (six) hours as needed for pain.    Plan discharge  Devoria Albe, MD, FACEP   MDM   I personally performed the services described in this documentation, which was scribed in my presence. The recorded information has been reviewed and considered.  Devoria Albe, MD, Armando Gang         Ward Givens, MD 03/18/12 [phone]

## 2012-03-18 NOTE — Progress Notes (Signed)
Orthopedic Tech Progress Note Patient Details:  Dylan Morgan [birth year] [id number]  Ortho Devices Type of Ortho Device: Knee Immobilizer Ortho Device/Splint Location: right knee Ortho Device/Splint Interventions: Application   Torryn Hudspeth 03/18/2012, 6:18 PM

## 2012-03-18 NOTE — ED Notes (Signed)
Presents with right knee pain from fall 4 days ago. Pt has immobilized knee and states the pain is better with immobilization, movement makes pain worse. Pain is described as shooting. Quarter sized abraison noted to right knee. No defromity or edema. CMS intact.

## 2012-03-18 NOTE — ED Notes (Signed)
Pt unable to ambulate to RR. Bedside commode provided. Wes, NT, at bedside. Pt tolerated well.

## 2012-03-22 ENCOUNTER — Ambulatory Visit (INDEPENDENT_AMBULATORY_CARE_PROVIDER_SITE_OTHER): Payer: Medicare Other | Admitting: Radiation Oncology

## 2012-03-22 VITALS — BP 112/80 | HR 71 | Temp 97.0°F | Ht 71.0 in | Wt 250.2 lb

## 2012-03-22 DIAGNOSIS — N183 Chronic kidney disease, stage 3 unspecified: Secondary | ICD-10-CM

## 2012-03-22 DIAGNOSIS — M79604 Pain in right leg: Secondary | ICD-10-CM

## 2012-03-22 DIAGNOSIS — M79605 Pain in left leg: Secondary | ICD-10-CM

## 2012-03-22 DIAGNOSIS — I779 Disorder of arteries and arterioles, unspecified: Secondary | ICD-10-CM

## 2012-03-22 DIAGNOSIS — I2510 Atherosclerotic heart disease of native coronary artery without angina pectoris: Secondary | ICD-10-CM

## 2012-03-22 DIAGNOSIS — N189 Chronic kidney disease, unspecified: Secondary | ICD-10-CM

## 2012-03-22 DIAGNOSIS — M79609 Pain in unspecified limb: Secondary | ICD-10-CM

## 2012-03-22 DIAGNOSIS — I10 Essential (primary) hypertension: Secondary | ICD-10-CM

## 2012-03-22 DIAGNOSIS — I129 Hypertensive chronic kidney disease with stage 1 through stage 4 chronic kidney disease, or unspecified chronic kidney disease: Secondary | ICD-10-CM

## 2012-03-22 LAB — BASIC METABOLIC PANEL
BUN: 29 mg/dL — ABNORMAL HIGH (ref 6–23)
CO2: 27 mEq/L (ref 19–32)
Chloride: 101 mEq/L (ref 96–112)
Creat: 1.43 mg/dL — ABNORMAL HIGH (ref 0.50–1.35)
Glucose, Bld: 92 mg/dL (ref 70–99)

## 2012-03-22 NOTE — Assessment & Plan Note (Signed)
BP Readings from Last 3 Encounters:  03/22/12 112/80  03/18/12 126/87  03/13/12 120/68    Lab Results  Component Value Date   NA 137 03/13/2012   K 4.3 03/13/2012   CREATININE 1.57* 03/13/2012    Assessment:  Blood pressure control: controlled  Progress toward BP goal:  at goal  Comments:   Plan:  Medications:  continue current medications  Educational resources provided: brochure;handout  Self management tools provided:    Other plans:

## 2012-03-22 NOTE — Patient Instructions (Signed)
Fall Prevention and Home Safety  Falls cause injuries and can affect all age groups. It is possible to prevent falls.    HOW TO PREVENT FALLS   Wear shoes with rubber soles that do not have an opening for your toes.   Keep the inside and outside of your house well lit.   Use night lights throughout your home.   Remove clutter from floors.   Clean up floor spills.   Remove throw rugs or fasten them to the floor with carpet tape.   Do not place electrical cords across pathways.   Put grab bars by your tub, shower, and toilet. Do not use towel bars as grab bars.   Put handrails on both sides of the stairway. Fix loose handrails.   Do not climb on stools or stepladders, if possible.   Do not wax your floors.   Repair uneven or unsafe sidewalks, walkways, or stairs.   Keep items you use a lot within reach.   Be aware of pets.   Keep emergency numbers next to the telephone.   Put smoke detectors in your home and near bedrooms.  Ask your doctor what other things you can do to prevent falls.  Document Released: 01/14/2009 Document Revised: 09/19/2011 Document Reviewed: 06/20/2011  [employer] Patient Information 2013 [employer].

## 2012-03-22 NOTE — Assessment & Plan Note (Signed)
Will recheck Cr today given recent AKI with Cr from baseline of ~1.3-1.4 to ~1.9. Pt advised to maintain adequate PO hydration, and has been previously advised to avoid NSAIDs.  - BMET drawn, results pending

## 2012-03-22 NOTE — Progress Notes (Signed)
Subjective:    Patient ID: Dylan Morgan, male    DOB: Jan 29, 1946, 66 y.o.   MRN: [id number]  HPI Patient is a 66 year old man with PMH significant for stage III CKD, CAD, carotid artery disease, AAA, who presents to clinic for followup on a recent hospitalization for acute on chronic renal failure, and also for recent ED visit for right knee trauma. Patient states that his knee is healing well, and describes the fall as mechanical in nature. He states that he began using a cane after the fall, and that his ambulation is improved with this. Patient has no other complaints today, and states that his bilateral lower extremity pain which he states he gets every one or, has improved since his previous visit. To note, patient had lower extremity ABIs and Dopplers performed, both of which were within normal limits.    Review of Systems  All other systems reviewed and are negative.    Current Outpatient Medications: Current Outpatient Prescriptions  Medication Sig Dispense Refill  . aspirin EC 81 MG EC tablet Take 1 tablet (81 mg total) by mouth daily.      Marland Kitchen HYDROcodone-acetaminophen (NORCO/VICODIN) 5-325 MG per tablet Take 2 tablets by mouth every 6 (six) hours as needed. For pain      . HYDROcodone-acetaminophen (NORCO/VICODIN) 5-325 MG per tablet Take 2 tablets by mouth every 6 (six) hours as needed for pain.  20 tablet  0  . lisinopril (PRINIVIL,ZESTRIL) 10 MG tablet Take 10 mg by mouth at bedtime.      . Multiple Vitamins-Minerals (MULTIVITAMIN WITH MINERALS) tablet Take 1 tablet by mouth at bedtime.       . sildenafil (VIAGRA) 100 MG tablet Take 100 mg by mouth daily as needed. For erectile dysfunction       . simvastatin (ZOCOR) 10 MG tablet Take 10 mg by mouth at bedtime.      Marland Kitchen warfarin (COUMADIN) 5 MG tablet Take 2.5-5 mg by mouth at bedtime. MWF-0.5half tab (2.5 mg total), All other days-1 tab (5 mg total)        Allergies: Allergies  Allergen Reactions  . Diltiazem Hcl Itching  .  Oxycodone-Acetaminophen Other (See Comments)    vertigo     Past Medical History: Past Medical History  Diagnosis Date  . CAD (coronary artery disease) CABG in 2004    Nuclear, October, 2010, no ischemia, EF 71%  . GERD (gastroesophageal reflux disease)   . Hypertension   . Hyperlipidemia   . Renal insufficiency   . Cellulitis and abscess of leg     right leg  . Tobacco abuse   . Chronic back pain   . Chronic neck pain   . Seborrheic dermatitis of scalp   . Abnormal LFTs     Hx of abnormal LFT's  . Bradycardia   . AAA (abdominal aortic aneurysm)     November 2010, Stenting, November, 2010  . Paroxysmal atrial fibrillation 02/22/2009    diltiazem po started but discontinued due to side effects.  . Ejection fraction     EF 40% in the past  /   EF 55-65% echo, November, 2006  . Hx of CABG     2004  . Carotid artery disease     Doppler, October, 2012, and old LICA occlusion , RICA no significant abnormality  . Arthritis   . Prediabetes   . CVA (cerebral infarction)     R Carona radiata stroke 08/16/2011  . Stroke 11/26/11    Past  Surgical History: Past Surgical History  Procedure Date  . Coronary artery bypass graft 2004  . Rotator cuff repair   . Abdominal aortic aneurysm repair 2010    Aortic stent repair    Family History: Family History  Problem Relation Age of Onset  . Diabetes Mother   . Stroke Father   . Cancer Sister     thyroid cancer  . Heart disease Brother     Coronary artery disease    Social History: History   Social History  . Marital Status: Single    Spouse Name: N/A    Number of Children: N/A  . Years of Education: N/A   Occupational History  . disabled    Social History Main Topics  . Smoking status: Former Smoker    Types: Cigarettes    Quit date: 02/03/2010  . Smokeless tobacco: Never Used  . Alcohol Use: No  . Drug Use: No  . Sexually Active: No   Other Topics Concern  . Not on file   Social History Narrative   Lives  with son and his significant other, denies being married. Has 3 daughters & a son.  Several grandchildren. Divorced.Regular exercise.Prior to retirement, hung dry wall for a living.      Vital Signs: Blood pressure 112/80, pulse 71, temperature 97 F (36.1 C), temperature source Oral, height 5\' 11"  (1.803 m), weight 250 lb 3.2 oz (113.49 kg), SpO2 97.00%.      Objective:   Physical Exam  Constitutional: He is oriented to person, place, and time. He appears well-developed and well-nourished. No distress.  HENT:  Head: Normocephalic and atraumatic.  Eyes: Pupils are equal, round, and reactive to light. No scleral icterus.  Neck: Normal range of motion. Neck supple. No tracheal deviation present.  Cardiovascular: Normal rate and regular rhythm.   No murmur heard. Pulmonary/Chest: Effort normal. He has no wheezes. He has no rales.  Abdominal: Soft. Bowel sounds are normal. He exhibits no distension. There is no tenderness.  Musculoskeletal: Normal range of motion. He exhibits no edema and no tenderness.       Right knee: He exhibits normal range of motion and no swelling. no tenderness found.       1-2cm well-healing abrasion on R knee  Neurological: He is alert and oriented to person, place, and time. No cranial nerve deficit.  Skin: Skin is warm and dry.  Psychiatric: He has a normal mood and affect. His behavior is normal.          Assessment & Plan:

## 2012-03-22 NOTE — Assessment & Plan Note (Signed)
Pt has f/u for this issue in 07/2011 with vascular surgery.

## 2012-03-22 NOTE — Assessment & Plan Note (Signed)
ABIs and dopplers ordered on previous clinic visit were negative for evidence of PAD and occlusive venous disease, respectively. As pt states his symptoms have improved, and that he has them yearly with cold weather, no further intervention is warranted at this time.

## 2012-04-25 ENCOUNTER — Telehealth: Payer: Self-pay | Admitting: *Deleted

## 2012-04-25 ENCOUNTER — Emergency Department (HOSPITAL_COMMUNITY)
Admission: EM | Admit: 2012-04-25 | Discharge: 2012-04-25 | Discharge status: Against Medical Advice | Payer: Medicare Other | Attending: Emergency Medicine | Admitting: Emergency Medicine

## 2012-04-25 ENCOUNTER — Encounter (HOSPITAL_COMMUNITY): Payer: Self-pay | Admitting: *Deleted

## 2012-04-25 DIAGNOSIS — M79609 Pain in unspecified limb: Secondary | ICD-10-CM | POA: Insufficient documentation

## 2012-04-25 NOTE — Telephone Encounter (Signed)
Pt called and stated his calf is swollen, red, warmer than the other, tender, more so when he pulls toes toward knee. He is asked to go to ED asap, he is agreeable, has been this way x 24+ hrs

## 2012-04-25 NOTE — Telephone Encounter (Signed)
Agree. Thanks

## 2012-04-25 NOTE — ED Notes (Signed)
Pt to ED for eval of right shin pain since 'scratching' last night. With minimal redness noted to lower leg. States he has hx of cellulitis 'with every cold spell'. Pt had cellulitis dx in December with po abx. Pulses palpable. Appears in nad.

## 2012-04-25 NOTE — ED Notes (Signed)
Pt states he doesn't want to wait will f/u with internal medicine clinic tomorrow.

## 2012-05-01 ENCOUNTER — Encounter: Payer: Self-pay | Admitting: Internal Medicine

## 2012-05-01 ENCOUNTER — Other Ambulatory Visit: Payer: Self-pay | Admitting: Internal Medicine

## 2012-05-01 ENCOUNTER — Ambulatory Visit (INDEPENDENT_AMBULATORY_CARE_PROVIDER_SITE_OTHER): Payer: Medicare Other | Admitting: *Deleted

## 2012-05-01 ENCOUNTER — Other Ambulatory Visit: Payer: Self-pay

## 2012-05-01 ENCOUNTER — Ambulatory Visit (INDEPENDENT_AMBULATORY_CARE_PROVIDER_SITE_OTHER): Payer: Medicare Other | Admitting: Cardiology

## 2012-05-01 ENCOUNTER — Encounter: Payer: Self-pay | Admitting: Cardiology

## 2012-05-01 VITALS — BP 122/86 | HR 61 | Ht 71.0 in | Wt 258.8 lb

## 2012-05-01 DIAGNOSIS — E785 Hyperlipidemia, unspecified: Secondary | ICD-10-CM

## 2012-05-01 DIAGNOSIS — I639 Cerebral infarction, unspecified: Secondary | ICD-10-CM

## 2012-05-01 DIAGNOSIS — I4891 Unspecified atrial fibrillation: Secondary | ICD-10-CM

## 2012-05-01 DIAGNOSIS — I480 Paroxysmal atrial fibrillation: Secondary | ICD-10-CM

## 2012-05-01 DIAGNOSIS — Z7901 Long term (current) use of anticoagulants: Secondary | ICD-10-CM

## 2012-05-01 DIAGNOSIS — I779 Disorder of arteries and arterioles, unspecified: Secondary | ICD-10-CM

## 2012-05-01 DIAGNOSIS — I2510 Atherosclerotic heart disease of native coronary artery without angina pectoris: Secondary | ICD-10-CM

## 2012-05-01 DIAGNOSIS — I10 Essential (primary) hypertension: Secondary | ICD-10-CM

## 2012-05-01 MED ORDER — SILDENAFIL CITRATE 100 MG PO TABS: 100.0000 mg | ORAL_TABLET | Freq: Every day | ORAL | Status: DC | PRN

## 2012-05-01 NOTE — Assessment & Plan Note (Signed)
Blood pressure is controlled. No change in therapy. 

## 2012-05-01 NOTE — Assessment & Plan Note (Signed)
Patient is receiving a statin. 

## 2012-05-01 NOTE — Progress Notes (Signed)
HPI  Patient is seen today to followup coronary disease. I saw him last October, 2013. He had a TIA in August, 2013. He had good LV function. He is being followed very carefully by vascular surgeon for his known significant carotid disease. The plan at this time is to continue to follow him medically. He's not been having any chest pain. He mentions that some mornings he wakes up with some soreness in his left neck and a mild headache. This goes away over the morning time.  He asked me about a skin lesion on his arm just above his wrist on the underside of his arm. I told him that I could not diagnosis.  Allergies  Allergen Reactions  . Diltiazem Hcl Itching  . Oxycodone-Acetaminophen Other (See Comments)    vertigo    Current Outpatient Prescriptions  Medication Sig Dispense Refill  . aspirin EC 81 MG EC tablet Take 1 tablet (81 mg total) by mouth daily.      Marland Kitchen HYDROcodone-acetaminophen (NORCO/VICODIN) 5-325 MG per tablet Take 2 tablets by mouth every 6 (six) hours as needed for pain.  20 tablet  0  . lisinopril (PRINIVIL,ZESTRIL) 10 MG tablet Take 10 mg by mouth at bedtime.      . Multiple Vitamins-Minerals (MULTIVITAMIN WITH MINERALS) tablet Take 1 tablet by mouth at bedtime.       . sildenafil (VIAGRA) 100 MG tablet Take 100 mg by mouth daily as needed. For erectile dysfunction       . simvastatin (ZOCOR) 10 MG tablet Take 10 mg by mouth at bedtime.      Marland Kitchen warfarin (COUMADIN) 5 MG tablet Take 2.5-5 mg by mouth at bedtime. MWF-0.5half tab (2.5 mg total), All other days-1 tab (5 mg total)        History   Social History  . Marital Status: Single    Spouse Name: N/A    Number of Children: N/A  . Years of Education: N/A   Occupational History  . disabled    Social History Main Topics  . Smoking status: Former Smoker    Types: Cigarettes    Quit date: 02/03/2010  . Smokeless tobacco: Never Used  . Alcohol Use: No  . Drug Use: No  . Sexually Active: No   Other Topics  Concern  . Not on file   Social History Narrative   Lives with son and his significant other, denies being married. Has 3 daughters & a son.  Several grandchildren. Divorced.Regular exercise.Prior to retirement, hung dry wall for a living.     Family History  Problem Relation Age of Onset  . Diabetes Mother   . Stroke Father   . Cancer Sister     thyroid cancer  . Heart disease Brother     Coronary artery disease    Past Medical History  Diagnosis Date  . CAD (coronary artery disease) CABG in 2004    Nuclear, October, 2010, no ischemia, EF 71%  . GERD (gastroesophageal reflux disease)   . Hypertension   . Hyperlipidemia   . Renal insufficiency   . Cellulitis and abscess of leg     right leg  . Tobacco abuse   . Chronic back pain   . Chronic neck pain   . Seborrheic dermatitis of scalp   . Abnormal LFTs     Hx of abnormal LFT's  . Bradycardia   . AAA (abdominal aortic aneurysm)     November 2010, Stenting, November, 2010  . Paroxysmal atrial  fibrillation 02/22/2009    diltiazem po started but discontinued due to side effects.  . Ejection fraction     EF 40% in the past  /   EF 55-65% echo, November, 2006  . Hx of CABG     2004  . Carotid artery disease     Doppler, October, 2012, and old LICA occlusion , RICA no significant abnormality  . Arthritis   . Prediabetes   . CVA (cerebral infarction)     R Carona radiata stroke 08/16/2011  . Stroke 11/26/11    Past Surgical History  Procedure Date  . Coronary artery bypass graft 2004  . Rotator cuff repair   . Abdominal aortic aneurysm repair 2010    Aortic stent repair    Patient Active Problem List  Diagnosis  . DEPRESSIVE DISORDER  . GERD  . CKD (chronic kidney disease), stage III  . DERMATITIS, SEBORRHEIC  . BACK PAIN, CHRONIC  . SLEEP APNEA  . Erectile dysfunction  . Leg cramps, sleep related  . CAD (coronary artery disease)  . Hypertension  . Hyperlipidemia  . Bradycardia  . Paroxysmal atrial  fibrillation  . Ejection fraction  . Hx of CABG  . AAA (abdominal aortic aneurysm)  . Encounter for long-term (current) use of anticoagulants  . Carotid artery disease  . Prediabetes  . CVA (cerebral infarction)  . Cellulitis of right anterior lower leg  . Preventative health care  . TIA (transient ischemic attack)  . Occlusion and stenosis of carotid artery without mention of cerebral infarction  . H/O TIA (transient ischemic attack) and stroke  . Abdominal aneurysm without mention of rupture  . Lower extremity pain, bilateral  . Neck pain, chronic    ROS   Patient denies fever, chills, headache, sweats, rash, change in vision, change in hearing, chest pain, cough, nausea vomiting, urinary symptoms.  PHYSICAL EXAM  Patient is overweight. He is oriented to person time and place. Affect is normal. There is no jugulovenous distention. Lungs are clear. Respiratory effort is nonlabored. Cardiac exam reveals S1 and S2. There no clicks or significant murmurs. The abdomen is soft. There is no peripheral edema. He has to skin lesions just above his wrist. One does have a circular appearance. He will follow up with his primary physician to see if there is a cream for this. There is no peripheral edema.  Filed Vitals:   05/01/12 1152  BP: 122/86  Pulse: 61  Height: 5\' 11"  (1.803 m)  Weight: 258 lb 12.8 oz (117.391 kg)   EKG is done today and reviewed by me. There is incomplete right bundle branch block. There are no new diagnostic changes when compared to the past.  ASSESSMENT & PLAN

## 2012-05-01 NOTE — Assessment & Plan Note (Signed)
His carotid disease is being followed very carefully by vascular surgery.

## 2012-05-01 NOTE — Patient Instructions (Addendum)
Your physician wants you to follow-up in:  6 months. You will receive a reminder letter in the mail two months in advance. If you don't receive a letter, please call our office to schedule the follow-up appointment.   

## 2012-05-01 NOTE — Assessment & Plan Note (Signed)
Coronary disease is stable. No further workup at this time. 

## 2012-05-08 ENCOUNTER — Telehealth: Payer: Self-pay | Admitting: Internal Medicine

## 2012-05-08 NOTE — Telephone Encounter (Signed)
Rec'd documentation from [facility] that the patient has been contacted.  This patient has Dentures and we can't make him new one's.

## 2012-05-10 ENCOUNTER — Ambulatory Visit (INDEPENDENT_AMBULATORY_CARE_PROVIDER_SITE_OTHER): Payer: Medicare Other

## 2012-05-10 DIAGNOSIS — Z7901 Long term (current) use of anticoagulants: Secondary | ICD-10-CM

## 2012-05-10 DIAGNOSIS — I480 Paroxysmal atrial fibrillation: Secondary | ICD-10-CM

## 2012-05-10 DIAGNOSIS — I6350 Cerebral infarction due to unspecified occlusion or stenosis of unspecified cerebral artery: Secondary | ICD-10-CM

## 2012-05-10 DIAGNOSIS — I639 Cerebral infarction, unspecified: Secondary | ICD-10-CM

## 2012-05-10 DIAGNOSIS — I4891 Unspecified atrial fibrillation: Secondary | ICD-10-CM

## 2012-05-10 LAB — POCT INR: INR: 2.5

## 2012-05-10 NOTE — Addendum Note (Signed)
Addended by: Neomia Dear on: 05/10/2012 08:10 PM   Modules accepted: Orders

## 2012-05-17 ENCOUNTER — Telehealth: Payer: Self-pay | Admitting: Physician Assistant

## 2012-05-17 NOTE — Telephone Encounter (Signed)
Pt called - is out of Coumadin. Office closed due to snow. Is on Coumadin for afib/TIA. No bleeding complications. Current dose is 5mg , 1 tablet every day except 1/2 tablet on Mondays and Fridays. INR last checked 05/10/12. I called in Coumadin 5mg  po as directed (as above) disp #30 with 3 refills. Pt aware. Dayna Dunn PA-C

## 2012-05-24 ENCOUNTER — Ambulatory Visit (INDEPENDENT_AMBULATORY_CARE_PROVIDER_SITE_OTHER): Payer: Medicare Other

## 2012-05-24 DIAGNOSIS — I480 Paroxysmal atrial fibrillation: Secondary | ICD-10-CM

## 2012-05-24 DIAGNOSIS — I639 Cerebral infarction, unspecified: Secondary | ICD-10-CM

## 2012-05-24 DIAGNOSIS — Z7901 Long term (current) use of anticoagulants: Secondary | ICD-10-CM

## 2012-05-24 DIAGNOSIS — I4891 Unspecified atrial fibrillation: Secondary | ICD-10-CM

## 2012-05-24 DIAGNOSIS — I6350 Cerebral infarction due to unspecified occlusion or stenosis of unspecified cerebral artery: Secondary | ICD-10-CM

## 2012-05-31 ENCOUNTER — Other Ambulatory Visit: Payer: Self-pay | Admitting: *Deleted

## 2012-06-05 MED ORDER — SIMVASTATIN 10 MG PO TABS: 10.0000 mg | ORAL_TABLET | Freq: Every day | ORAL | Status: DC

## 2012-06-14 ENCOUNTER — Ambulatory Visit (INDEPENDENT_AMBULATORY_CARE_PROVIDER_SITE_OTHER): Payer: Medicare Other | Admitting: *Deleted

## 2012-06-14 DIAGNOSIS — I480 Paroxysmal atrial fibrillation: Secondary | ICD-10-CM

## 2012-06-14 DIAGNOSIS — I4891 Unspecified atrial fibrillation: Secondary | ICD-10-CM

## 2012-06-14 DIAGNOSIS — I639 Cerebral infarction, unspecified: Secondary | ICD-10-CM

## 2012-06-14 DIAGNOSIS — I6350 Cerebral infarction due to unspecified occlusion or stenosis of unspecified cerebral artery: Secondary | ICD-10-CM

## 2012-06-14 DIAGNOSIS — Z7901 Long term (current) use of anticoagulants: Secondary | ICD-10-CM

## 2012-07-01 ENCOUNTER — Other Ambulatory Visit: Payer: Self-pay | Admitting: Vascular Surgery

## 2012-07-02 ENCOUNTER — Emergency Department (HOSPITAL_COMMUNITY): Payer: Medicare Other

## 2012-07-02 ENCOUNTER — Other Ambulatory Visit: Payer: No Typology Code available for payment source

## 2012-07-02 ENCOUNTER — Encounter (HOSPITAL_COMMUNITY): Payer: Self-pay | Admitting: Emergency Medicine

## 2012-07-02 ENCOUNTER — Ambulatory Visit: Payer: No Typology Code available for payment source | Admitting: Vascular Surgery

## 2012-07-02 ENCOUNTER — Emergency Department (HOSPITAL_COMMUNITY)
Admission: EM | Admit: 2012-07-02 | Discharge: 2012-07-02 | Disposition: A | Discharge status: Home / Self Care | Payer: Medicare Other | Attending: Emergency Medicine | Admitting: Emergency Medicine

## 2012-07-02 ENCOUNTER — Ambulatory Visit
Admission: RE | Admit: 2012-07-02 | Discharge: 2012-07-02 | Disposition: A | Discharge status: Home / Self Care | Payer: Medicare Other | Source: Ambulatory Visit | Attending: Vascular Surgery | Admitting: Vascular Surgery

## 2012-07-02 DIAGNOSIS — Z8673 Personal history of transient ischemic attack (TIA), and cerebral infarction without residual deficits: Secondary | ICD-10-CM | POA: Insufficient documentation

## 2012-07-02 DIAGNOSIS — I10 Essential (primary) hypertension: Secondary | ICD-10-CM | POA: Insufficient documentation

## 2012-07-02 DIAGNOSIS — Z7982 Long term (current) use of aspirin: Secondary | ICD-10-CM | POA: Insufficient documentation

## 2012-07-02 DIAGNOSIS — Z872 Personal history of diseases of the skin and subcutaneous tissue: Secondary | ICD-10-CM | POA: Insufficient documentation

## 2012-07-02 DIAGNOSIS — Y9389 Activity, other specified: Secondary | ICD-10-CM | POA: Insufficient documentation

## 2012-07-02 DIAGNOSIS — G8929 Other chronic pain: Secondary | ICD-10-CM | POA: Insufficient documentation

## 2012-07-02 DIAGNOSIS — Y929 Unspecified place or not applicable: Secondary | ICD-10-CM | POA: Insufficient documentation

## 2012-07-02 DIAGNOSIS — Z8639 Personal history of other endocrine, nutritional and metabolic disease: Secondary | ICD-10-CM | POA: Insufficient documentation

## 2012-07-02 DIAGNOSIS — Z23 Encounter for immunization: Secondary | ICD-10-CM | POA: Insufficient documentation

## 2012-07-02 DIAGNOSIS — Z79899 Other long term (current) drug therapy: Secondary | ICD-10-CM | POA: Insufficient documentation

## 2012-07-02 DIAGNOSIS — Z862 Personal history of diseases of the blood and blood-forming organs and certain disorders involving the immune mechanism: Secondary | ICD-10-CM | POA: Insufficient documentation

## 2012-07-02 DIAGNOSIS — M549 Dorsalgia, unspecified: Secondary | ICD-10-CM | POA: Insufficient documentation

## 2012-07-02 DIAGNOSIS — S61219A Laceration without foreign body of unspecified finger without damage to nail, initial encounter: Secondary | ICD-10-CM

## 2012-07-02 DIAGNOSIS — Z87891 Personal history of nicotine dependence: Secondary | ICD-10-CM | POA: Insufficient documentation

## 2012-07-02 DIAGNOSIS — S61209A Unspecified open wound of unspecified finger without damage to nail, initial encounter: Secondary | ICD-10-CM | POA: Insufficient documentation

## 2012-07-02 DIAGNOSIS — I2510 Atherosclerotic heart disease of native coronary artery without angina pectoris: Secondary | ICD-10-CM | POA: Insufficient documentation

## 2012-07-02 DIAGNOSIS — Z7901 Long term (current) use of anticoagulants: Secondary | ICD-10-CM | POA: Insufficient documentation

## 2012-07-02 DIAGNOSIS — Z48812 Encounter for surgical aftercare following surgery on the circulatory system: Secondary | ICD-10-CM

## 2012-07-02 DIAGNOSIS — E785 Hyperlipidemia, unspecified: Secondary | ICD-10-CM | POA: Insufficient documentation

## 2012-07-02 DIAGNOSIS — Z87448 Personal history of other diseases of urinary system: Secondary | ICD-10-CM | POA: Insufficient documentation

## 2012-07-02 DIAGNOSIS — W268XXA Contact with other sharp object(s), not elsewhere classified, initial encounter: Secondary | ICD-10-CM | POA: Insufficient documentation

## 2012-07-02 DIAGNOSIS — Z8719 Personal history of other diseases of the digestive system: Secondary | ICD-10-CM | POA: Insufficient documentation

## 2012-07-02 DIAGNOSIS — Z8679 Personal history of other diseases of the circulatory system: Secondary | ICD-10-CM | POA: Insufficient documentation

## 2012-07-02 DIAGNOSIS — I714 Abdominal aortic aneurysm, without rupture: Secondary | ICD-10-CM

## 2012-07-02 DIAGNOSIS — M129 Arthropathy, unspecified: Secondary | ICD-10-CM | POA: Insufficient documentation

## 2012-07-02 DIAGNOSIS — M542 Cervicalgia: Secondary | ICD-10-CM | POA: Insufficient documentation

## 2012-07-02 LAB — PROTIME-INR: INR: 1.94 — ABNORMAL HIGH (ref 0.00–1.49)

## 2012-07-02 MED ORDER — IOHEXOL 350 MG/ML SOLN
60.0000 mL | Freq: Once | INTRAVENOUS | Status: AC | PRN
  Administered 2012-07-02: 60 mL via INTRAVENOUS

## 2012-07-02 MED ORDER — HYDROCODONE-ACETAMINOPHEN 5-325 MG PO TABS
1.0000 | ORAL_TABLET | Freq: Once | ORAL | Status: AC
  Administered 2012-07-02: 1 via ORAL
  Filled 2012-07-02: qty 1

## 2012-07-02 MED ORDER — TETANUS-DIPHTH-ACELL PERTUSSIS 5-2.5-18.5 LF-MCG/0.5 IM SUSP
0.5000 mL | Freq: Once | INTRAMUSCULAR | Status: AC
  Administered 2012-07-02: 0.5 mL via INTRAMUSCULAR
  Filled 2012-07-02: qty 0.5

## 2012-07-02 NOTE — ED Notes (Signed)
Pt here with chainsaw injury to right 2nd and 3rd digit; pt takes coumadin and has evulsion on side of third finger; bleeding controlled

## 2012-07-02 NOTE — ED Provider Notes (Signed)
Patient seen by Dr Roselyn Bering.  I was asked to suture laceration of third finger for chainsaw injury.  Sensation intact, capillary refill < 2 seconds.    LACERATION REPAIR Performed by: Trixie Dredge B Authorized by: Trixie Dredge B Consent: Verbal consent obtained. Risks and benefits: risks, benefits and alternatives were discussed Consent given by: patient Patient identity confirmed: provided demographic data Prepped and Draped in normal sterile fashion Wound explored  Laceration Location: right third finger  Laceration Length: 1.5 cm, complex.  Several strips of skin loose over wound.    No Foreign Bodies seen or palpated  Anesthesia: digital block  Local anesthetic: lidocaine 2% no epinephrine  Anesthetic total: 7 ml  Irrigation method: syringe Amount of cleaning: standard  Skin closure: 5-0 ethilon  Number of sutures: 10  Technique: simple interrupted  Patient tolerance: Patient tolerated the procedure well with no immediate complications.     Trixie Dredge, PA-C 07/02/12 2001

## 2012-07-02 NOTE — ED Provider Notes (Signed)
History    CSN: [id number] Arrival date & time 07/02/12  1700 First MD Initiated Contact with Patient 07/02/12 1850     Chief Complaint  Patient presents with  . Finger Injury   HPI Patient presents to the emergency room with complaints of a finger injury to his right index and middle finger. Patient was using a chainsaw when it kicked back on him in he lacerated his fingers. Patient denies any numbness or weakness. He does take Coumadin and in had some bleeding earlier but that has mostly resolved at this point. He denies any other injuries. Patient does feel like can move his digits without difficulty. Past Medical History  Diagnosis Date  . CAD (coronary artery disease) CABG in 2004    Nuclear, October, 2010, no ischemia, EF 71%  . GERD (gastroesophageal reflux disease)   . Hypertension   . Hyperlipidemia   . Renal insufficiency   . Cellulitis and abscess of leg     right leg  . Tobacco abuse   . Chronic back pain   . Chronic neck pain   . Seborrheic dermatitis of scalp   . Abnormal LFTs     Hx of abnormal LFT's  . Bradycardia   . AAA (abdominal aortic aneurysm)     November 2010, Stenting, November, 2010  . Paroxysmal atrial fibrillation 02/22/2009    diltiazem po started but discontinued due to side effects.  . Ejection fraction     EF 40% in the past  /   EF 55-65% echo, November, 2006  . Hx of CABG     2004  . Carotid artery disease     Doppler, October, 2012, and old LICA occlusion , RICA no significant abnormality  . Arthritis   . Prediabetes   . CVA (cerebral infarction)     R Carona radiata stroke 08/16/2011  . Stroke 11/26/11    Past Surgical History  Procedure Laterality Date  . Coronary artery bypass graft  2004  . Rotator cuff repair    . Abdominal aortic aneurysm repair  2010    Aortic stent repair    Family History  Problem Relation Age of Onset  . Diabetes Mother   . Stroke Father   . Cancer Sister     thyroid cancer  . Heart disease Brother      Coronary artery disease    History  Substance Use Topics  . Smoking status: Former Smoker    Types: Cigarettes    Quit date: 02/03/2010  . Smokeless tobacco: Never Used  . Alcohol Use: No      Review of Systems  All other systems reviewed and are negative.    Allergies  Diltiazem hcl and Oxycodone-acetaminophen  Home Medications   Current Outpatient Rx  Name  Route  Sig  Dispense  Refill  . aspirin EC 81 MG EC tablet   Oral   Take 1 tablet (81 mg total) by mouth daily.         Marland Kitchen lisinopril (PRINIVIL,ZESTRIL) 10 MG tablet   Oral   Take 10 mg by mouth at bedtime.         . Multiple Vitamins-Minerals (MULTIVITAMIN WITH MINERALS) tablet   Oral   Take 1 tablet by mouth at bedtime.          . simvastatin (ZOCOR) 10 MG tablet   Oral   Take 1 tablet (10 mg total) by mouth at bedtime.   90 tablet   1   . warfarin (  COUMADIN) 5 MG tablet   Oral   Take 2.5-5 mg by mouth at bedtime. MWF-0.5half tab (2.5 mg total), All other days-1 tab (5 mg total)           BP 135/92  Pulse 103  Temp(Src) 97.7 F (36.5 C) (Oral)  Resp 18  SpO2 96%  Physical Exam  Nursing note and vitals reviewed. Constitutional: He appears well-developed and well-nourished. No distress.  HENT:  Head: Normocephalic and atraumatic.  Right Ear: External ear normal.  Left Ear: External ear normal.  Eyes: Conjunctivae are normal. Right eye exhibits no discharge. Left eye exhibits no discharge. No scleral icterus.  Neck: Neck supple. No tracheal deviation present.  Cardiovascular: Normal rate.   Pulmonary/Chest: Effort normal. No stridor. No respiratory distress.  Musculoskeletal: He exhibits no edema.  Superficial abrasion type injury distal aspect of index finger, there is no penetration through the dermis; 1-2 cm laceration over the finger pad of the distal phalanx of the index finger, there is an avulsed portion of tissue that is still adherent with compromised vascular supply, distal  neurovascular intact,  Neurological: He is alert. Cranial nerve deficit: no gross deficits.  Skin: Skin is warm and dry. No rash noted.  Psychiatric: He has a normal mood and affect.    ED Course  Procedures (including critical care time)  Labs Reviewed  PROTIME-INR - Abnormal; Notable for the following:    Prothrombin Time 21.4 (*)    INR 1.94 (*)    All other components within normal limits   Dg Hand Complete Right  07/02/2012  *RADIOLOGY REPORT*  Clinical Data: Finger injury, caught index and middle finger in chainsaw  RIGHT HAND - COMPLETE 3+ VIEW  Comparison: None.  Findings:  Examination is minimally degraded secondary to obliquity.  There are soft tissue lacerations involving the palmar surface of the distal phalanx of the second and third digits.  There is a punctate (1-2 mm) radiopaque foreign body within the soft tissues of the distal phalanx of the index finger.  No definite fracture or dislocation.  Asymmetric degenerative change of the third MCP joint is suspected with joint space loss, subchondral sclerosis and osteophytosis.  No definite erosions.  IMPRESSION: 1.  No fracture or dislocation. 2.  Soft tissue lacerations about the distal phalanx of the index and middle finger.  3.  Punctate radiopaque foreign body within the soft tissues of the index finger.   Original Report Authenticated By: Tacey Ruiz, MD       1. Finger laceration, initial encounter       MDM  Laceration repaired by [place].  Will dc home        Celene Kras, MD 07/02/12 2005

## 2012-07-05 ENCOUNTER — Ambulatory Visit (INDEPENDENT_AMBULATORY_CARE_PROVIDER_SITE_OTHER): Payer: Medicare Other | Admitting: *Deleted

## 2012-07-05 DIAGNOSIS — Z7901 Long term (current) use of anticoagulants: Secondary | ICD-10-CM

## 2012-07-05 DIAGNOSIS — I6350 Cerebral infarction due to unspecified occlusion or stenosis of unspecified cerebral artery: Secondary | ICD-10-CM

## 2012-07-05 DIAGNOSIS — I639 Cerebral infarction, unspecified: Secondary | ICD-10-CM

## 2012-07-05 DIAGNOSIS — I480 Paroxysmal atrial fibrillation: Secondary | ICD-10-CM

## 2012-07-05 DIAGNOSIS — I4891 Unspecified atrial fibrillation: Secondary | ICD-10-CM

## 2012-07-05 LAB — POCT INR: INR: 2.1

## 2012-07-26 ENCOUNTER — Ambulatory Visit (INDEPENDENT_AMBULATORY_CARE_PROVIDER_SITE_OTHER): Payer: Medicare Other

## 2012-07-26 DIAGNOSIS — I480 Paroxysmal atrial fibrillation: Secondary | ICD-10-CM

## 2012-07-26 DIAGNOSIS — I4891 Unspecified atrial fibrillation: Secondary | ICD-10-CM

## 2012-07-26 DIAGNOSIS — I639 Cerebral infarction, unspecified: Secondary | ICD-10-CM

## 2012-07-26 DIAGNOSIS — I6350 Cerebral infarction due to unspecified occlusion or stenosis of unspecified cerebral artery: Secondary | ICD-10-CM

## 2012-07-26 DIAGNOSIS — Z7901 Long term (current) use of anticoagulants: Secondary | ICD-10-CM

## 2012-07-26 LAB — POCT INR: INR: 1.9

## 2012-08-09 ENCOUNTER — Encounter: Payer: Self-pay | Admitting: Internal Medicine

## 2012-08-09 ENCOUNTER — Ambulatory Visit (INDEPENDENT_AMBULATORY_CARE_PROVIDER_SITE_OTHER): Payer: Medicare Other | Admitting: Internal Medicine

## 2012-08-09 VITALS — BP 133/78 | HR 60 | Temp 97.8°F | Ht 71.0 in | Wt 249.7 lb

## 2012-08-09 DIAGNOSIS — L500 Allergic urticaria: Secondary | ICD-10-CM | POA: Insufficient documentation

## 2012-08-09 MED ORDER — CETIRIZINE HCL 10 MG PO TABS: 10.0000 mg | ORAL_TABLET | Freq: Every day | ORAL | Status: DC

## 2012-08-09 NOTE — Patient Instructions (Addendum)
Please make a followup appointment as needed.  He seemed to have allergic urticaria- which is an allergy reaction to pollens most likely in this season.  Start taking Zyrtec 10 mg daily over Claritin 10 mg daily whichever is cheaper.  If your rash get worse, or does not improve, or he starts having difficulty breathing, fever, chills other systemic symptoms, give [place] a call for early appointment.

## 2012-08-09 NOTE — Progress Notes (Signed)
  Subjective:    Patient ID: Dylan Morgan, male    DOB: 1946-03-07, 67 y.o.   MRN: [id number]  HPI patient is a pleasant 67 year old man with CAD stage III, seborrheic dermatitis, CAD, hypertension and other problems as per problem list, the clinic for acute onset diffuse rash.  Patient noticed round slightly raised red diffuse lesions on his skin including left forearm, left arm, right arm. These lesions itch. Never had this rash before. Also noticed itching rash in left axilla. He denies any new medications, outdoor activities, ticks or bugs exposure, travel outside.  He does describe associated mild rhinosinusitis symptoms- sinus congestion, nasal drip.  He denies any fever, chills, nausea, vomiting, abdominal pain, diarrhea, urinary abnormalities.    Review of Systems    as per history of present illness. Objective:   Physical Exam  General: NAD HEENT: PERRL, EOMI, no scleral icterus Cardiac: RRR, no rubs, murmurs or gallops Pulm: clear to auscultation bilaterally, moving normal volumes of air Abd: soft, nontender, nondistended, BS present Ext: warm and well perfused, no pedal edema Neuro: alert and oriented X3, cranial nerves II-XII grossly intact Skin: Round ringed lesion, raised- bilateral upper extremity- itchy. No weeping or discharge. No skin breakdown present.       Assessment & Plan:

## 2012-08-09 NOTE — Assessment & Plan Note (Signed)
As described in history of present illness, patient seems to have allergic urticaria along with mild allergic rhinosinusitis symptoms.  - Zyrtec 10 mg daily. - Advice about red flag signs of worsening symptoms including respiratory distress provided.

## 2012-08-12 ENCOUNTER — Encounter: Payer: Self-pay | Admitting: Internal Medicine

## 2012-08-14 NOTE — Progress Notes (Signed)
INTERNAL MEDICINE TEACHING ATTENDING ADDENDUM: I discussed this case with Dr. Patel soon after the patient visit. I have read the documentation and I agree with the plan of care. Please see the resident note for details of management.  

## 2012-08-15 ENCOUNTER — Ambulatory Visit (INDEPENDENT_AMBULATORY_CARE_PROVIDER_SITE_OTHER): Payer: Medicare Other | Admitting: Internal Medicine

## 2012-08-15 ENCOUNTER — Encounter: Payer: Self-pay | Admitting: Internal Medicine

## 2012-08-15 VITALS — BP 144/85 | HR 72 | Temp 97.2°F | Ht 71.0 in | Wt 250.2 lb

## 2012-08-15 DIAGNOSIS — M79609 Pain in unspecified limb: Secondary | ICD-10-CM

## 2012-08-15 DIAGNOSIS — L500 Allergic urticaria: Secondary | ICD-10-CM

## 2012-08-15 DIAGNOSIS — M79605 Pain in left leg: Secondary | ICD-10-CM

## 2012-08-15 MED ORDER — CLOTRIMAZOLE 1 % EX CREA: TOPICAL_CREAM | Freq: Two times a day (BID) | CUTANEOUS | Status: DC

## 2012-08-15 MED ORDER — LISINOPRIL 10 MG PO TABS: 10.0000 mg | ORAL_TABLET | Freq: Every day | ORAL | Status: DC

## 2012-08-15 MED ORDER — SIMVASTATIN 10 MG PO TABS: 10.0000 mg | ORAL_TABLET | Freq: Every day | ORAL | Status: DC

## 2012-08-15 NOTE — Assessment & Plan Note (Signed)
Patient complains of burning on the bottoms of his feet. Exam consistent with fungal infection  -Clotrimazole cream, apply to feet twice daily for 4-6 weeks -If patient continues to have numbness and burning symptoms despite therapy, may consider gabapentin for neuropathic pain/restless legs

## 2012-08-15 NOTE — Progress Notes (Signed)
Patient ID: Dylan Morgan, male   DOB: 12-02-1945, 67 y.o.   MRN: [id number]  Internal Medicine Clinic Visit    HPI:  Dylan TRICKEL is a 67 y.o. year old male with a history of coronary artery disease, hypertension, hyperlipidemia, tobacco abuse, AAA.  He presents today for followup of itchy rash on arms, legs, abdomen, back. He was seen in clinic on 08/09/2012, given Zyrtec 10 mg daily for was thought to be allergic urticaria, which the patient has been taking.  The patient states that since he started taking the Zyrtec, the itchiness has dissipated but the rash still remains. He states that overall, the rash is a little bit better. He states that it is not spreading. Rash is not painful, is not spreading, and no one at home has a rash or has been sick recently. He denies any fever, chills, lymphadenopathy, joint pains, myalgias, hematuria.  Patient would also like to discuss the pain in the bottom of his feet. This 4-5 years ago with sensations of burning on the bottom of his feet. He thinks this is arthritis and has been taking Tylenol, pickle juice, cherries. He denies any claudication symptoms and states that the pain is slightly better when he walks. He does state that he has some occasional numbness and tingling in his toes. He does sometimes wake up from sleep with restless legs and foot pain.  Denies being out in the garden, denies any other exposures or recent travel. No new medication besides the Zyrtec   Past Medical History  Diagnosis Date  . CAD (coronary artery disease) CABG in 2004    Nuclear, October, 2010, no ischemia, EF 71%  . GERD (gastroesophageal reflux disease)   . Hypertension   . Hyperlipidemia   . Renal insufficiency   . Cellulitis and abscess of leg     right leg  . Tobacco abuse   . Chronic back pain   . Chronic neck pain   . Seborrheic dermatitis of scalp   . Abnormal LFTs     Hx of abnormal LFT's  . Bradycardia   . AAA (abdominal aortic aneurysm)    November 2010, Stenting, November, 2010  . Paroxysmal atrial fibrillation 02/22/2009    diltiazem po started but discontinued due to side effects.  . Ejection fraction     EF 40% in the past  /   EF 55-65% echo, November, 2006  . Hx of CABG     2004  . Carotid artery disease     Doppler, October, 2012, and old LICA occlusion , RICA no significant abnormality  . Arthritis   . Prediabetes   . CVA (cerebral infarction)     R Carona radiata stroke 08/16/2011  . Stroke 11/26/11    Past Surgical History  Procedure Laterality Date  . Coronary artery bypass graft  2004  . Rotator cuff repair    . Abdominal aortic aneurysm repair  2010    Aortic stent repair     ROS:  A complete review of systems was otherwise negative, except as noted in the HPI.  Allergies: Diltiazem hcl and Oxycodone-acetaminophen  Medications: Current Outpatient Prescriptions  Medication Sig Dispense Refill  . aspirin EC 81 MG EC tablet Take 1 tablet (81 mg total) by mouth daily.      . cetirizine (ZYRTEC) 10 MG tablet Take 1 tablet (10 mg total) by mouth daily.  100 tablet  1  . lisinopril (PRINIVIL,ZESTRIL) 10 MG tablet Take 10 mg by mouth  at bedtime.      . Multiple Vitamins-Minerals (MULTIVITAMIN WITH MINERALS) tablet Take 1 tablet by mouth at bedtime.       . simvastatin (ZOCOR) 10 MG tablet Take 1 tablet (10 mg total) by mouth at bedtime.  90 tablet  1  . warfarin (COUMADIN) 5 MG tablet Take 2.5-5 mg by mouth at bedtime. MWF-0.5half tab (2.5 mg total), All other days-1 tab (5 mg total)       No current facility-administered medications for this visit.    History   Social History  . Marital Status: Single    Spouse Name: N/A    Number of Children: N/A  . Years of Education: N/A   Occupational History  . disabled    Social History Main Topics  . Smoking status: Former Smoker    Types: Cigarettes    Quit date: 02/03/2010  . Smokeless tobacco: Never Used  . Alcohol Use: No  . Drug Use: No  .  Sexually Active: No   Other Topics Concern  . Not on file   Social History Narrative   Lives with son and his significant other, denies being married. Has 3 daughters & a son.  Several grandchildren.    Divorced.   Regular exercise.   Prior to retirement, hung dry wall for a living.     family history includes Cancer in his sister; Diabetes in his mother; Heart disease in his brother; and Stroke in his father.  Physical Exam Blood pressure 144/85, pulse 72, temperature 97.2 F (36.2 C), temperature source Oral, height 5\' 11"  (1.803 m), weight 250 lb 3.2 oz (113.49 kg), SpO2 97.00%. General:  No acute distress, alert and oriented x 3, appears stated age HEENT:  PERRL, EOMI, no lymphadenopathy, moist mucous membranes Cardiovascular:  Regular rate and rhythm, no murmurs, rubs or gallops Respiratory:  Clear to auscultation bilaterally, no wheezes, rales, or rhonchi Abdomen:  Soft, nondistended, nontender, normoactive bowel sounds Extremities:  Warm and well-perfused, no clubbing, cyanosis, or edema.  Skin: Warm, dry, rash noted on R and L upper extremities, abdomen, b/l LE. Scattered slightly raised erythematous lesions <1cm with several additional ring shaped lesions with white center and red scaly edges.  Feet: slightly red, scaly, onychomycosis noted, foul smell, no ulcerations or discrete lesions, decreased sensation to monofilament around the ankles but preserved lower down around the toes, decreased peripheral pulses Neuro: Not anxious appearing, no depressed mood, normal affect  Labs: Lab Results  Component Value Date   CREATININE 1.53* 07/01/2012   BUN 23 07/01/2012   NA 138 03/22/2012   K 4.9 03/22/2012   CL 101 03/22/2012   CO2 27 03/22/2012   Lab Results  Component Value Date   WBC 8.0 03/12/2012   HGB 14.0 03/12/2012   HCT 41.1 03/12/2012   MCV 89.9 03/12/2012   PLT 174 03/12/2012      Assessment and Plan:  Patient was seen and evaluated with Dr. Josem Kaufmann,  attending physician.   FOLLOWUP: Dylan Morgan will follow back up in our clinic in approximately  1-2 weeks. Dylan Morgan knows to call out clinic in the meantime with any questions or new issues.

## 2012-08-15 NOTE — Assessment & Plan Note (Addendum)
Patient continues to have rash of unclear etiology. If this is truly allergic urticaria, resolution may take several weeks. Patient does not have any red flag symptoms.  -Will start clotrimazole cream for any component of fungal infection -Continue Zyrtec 10 mg daily -Reviewed warning signs and reasons to call the clinic or seek medical attention -If patient continues to have rash, may want to consider ESR, CRP, CBC at next visit for further workup

## 2012-08-15 NOTE — Patient Instructions (Signed)
Please return to clinic in one month to followup on rash and foot pain.   Please pick up your medicine from the pharmacy: Clotrimazole cream

## 2012-08-16 ENCOUNTER — Ambulatory Visit (INDEPENDENT_AMBULATORY_CARE_PROVIDER_SITE_OTHER): Payer: Medicare Other | Admitting: *Deleted

## 2012-08-16 DIAGNOSIS — Z7901 Long term (current) use of anticoagulants: Secondary | ICD-10-CM

## 2012-08-16 DIAGNOSIS — I480 Paroxysmal atrial fibrillation: Secondary | ICD-10-CM

## 2012-08-16 DIAGNOSIS — I4891 Unspecified atrial fibrillation: Secondary | ICD-10-CM

## 2012-08-16 DIAGNOSIS — I6350 Cerebral infarction due to unspecified occlusion or stenosis of unspecified cerebral artery: Secondary | ICD-10-CM

## 2012-08-16 DIAGNOSIS — I639 Cerebral infarction, unspecified: Secondary | ICD-10-CM

## 2012-08-20 NOTE — Progress Notes (Signed)
I saw and evaluated the patient.  I personally confirmed the key portions of Dr. Kesty's history and exam and reviewed pertinent patient test results.  The assessment, diagnosis, and plan were formulated together and I agree with the documentation in the resident's note. 

## 2012-08-22 ENCOUNTER — Telehealth: Payer: Self-pay | Admitting: *Deleted

## 2012-08-22 NOTE — Telephone Encounter (Signed)
Pt informed and voices understanding 

## 2012-08-22 NOTE — Telephone Encounter (Signed)
Pt called stating he needs something else for the rash he was treated for on 5/9 and 5/15. The rash is on legs, arms, abd and back.  He states there is a pattern on rings around each area and he thinks it might be ringworm. He has been taking Zyrtec with no improvement.  Please advise. Pt # [id number]

## 2012-08-22 NOTE — Telephone Encounter (Signed)
Patient was advised to use clotrimazole on his feet at the last visit. He may use this cream on the lesions on his arms, legs, etc as well. He could also try using Selsun Blue OTC for his body. Would recommend making an appointment for 1-2 weeks for follow up.

## 2012-09-02 ENCOUNTER — Ambulatory Visit (INDEPENDENT_AMBULATORY_CARE_PROVIDER_SITE_OTHER): Payer: Medicare Other | Admitting: *Deleted

## 2012-09-02 DIAGNOSIS — I4891 Unspecified atrial fibrillation: Secondary | ICD-10-CM

## 2012-09-02 DIAGNOSIS — I6350 Cerebral infarction due to unspecified occlusion or stenosis of unspecified cerebral artery: Secondary | ICD-10-CM

## 2012-09-02 DIAGNOSIS — I639 Cerebral infarction, unspecified: Secondary | ICD-10-CM

## 2012-09-02 DIAGNOSIS — I480 Paroxysmal atrial fibrillation: Secondary | ICD-10-CM

## 2012-09-02 DIAGNOSIS — Z7901 Long term (current) use of anticoagulants: Secondary | ICD-10-CM

## 2012-09-02 LAB — POCT INR: INR: 2.6

## 2012-09-10 ENCOUNTER — Other Ambulatory Visit: Payer: Medicare Other

## 2012-09-10 ENCOUNTER — Ambulatory Visit: Payer: Medicare Other | Admitting: Vascular Surgery

## 2012-09-19 ENCOUNTER — Ambulatory Visit (INDEPENDENT_AMBULATORY_CARE_PROVIDER_SITE_OTHER): Payer: Medicare Other | Admitting: Internal Medicine

## 2012-09-19 ENCOUNTER — Telehealth: Payer: Self-pay | Admitting: *Deleted

## 2012-09-19 VITALS — BP 133/84 | HR 97 | Temp 97.0°F | Ht 71.0 in | Wt 248.4 lb

## 2012-09-19 DIAGNOSIS — L500 Allergic urticaria: Secondary | ICD-10-CM

## 2012-09-19 LAB — CBC WITH DIFFERENTIAL/PLATELET
Basophils Absolute: 0 10*3/uL (ref 0.0–0.1)
Basophils Relative: 1 % (ref 0–1)
Eosinophils Relative: 3 % (ref 0–5)
HCT: 41.6 % (ref 39.0–52.0)
Lymphs Abs: 1.7 10*3/uL (ref 0.7–4.0)
MCHC: 34.1 g/dL (ref 30.0–36.0)
MCV: 87.2 fL (ref 78.0–100.0)
Monocytes Absolute: 0.6 10*3/uL (ref 0.1–1.0)
Neutro Abs: 2.5 10*3/uL (ref 1.7–7.7)
Platelets: 156 10*3/uL (ref 150–400)
RDW: 15 % (ref 11.5–15.5)
WBC: 4.9 10*3/uL (ref 4.0–10.5)

## 2012-09-19 MED ORDER — HYDROXYZINE HCL 10 MG PO TABS: 10.0000 mg | ORAL_TABLET | Freq: Two times a day (BID) | ORAL | Status: DC | PRN

## 2012-09-19 NOTE — Patient Instructions (Signed)
You were seen in clinic today for rash.  We took some blood work in clinic today and will contact you with any abnormal results. If you are curious and have not heard from [place], you may call the clinic after 3-4 days and these results should be available. We have sent a referral to dermatology that you should follow up with. We are starting a new medication for you today: hydroxyzine 10mg  twice a day as needed for itching and rash. You may increase this to three times a day if nothing has improved in a week.  Hives Hives are itchy, red, puffy (swollen) areas of the skin. Hives can change in size and location on your body. Hives can come and go for hours, days, or weeks. Hives do not spread from person to person (noncontagious). Scratching, exercise, and stress can make your hives worse. HOME CARE  Avoid things that cause your hives (triggers).  Take antihistamine medicines as told by your doctor. Do not drive while taking an antihistamine.  Take any other medicines for itching as told by your doctor.  Wear loose-fitting clothing.  Keep all doctor visits as told. GET HELP RIGHT AWAY IF:   You have a fever.  Your tongue or lips are puffy.  You have trouble breathing or swallowing.  You feel tightness in the throat or chest.  You have belly (abdominal) pain.  You have lasting or severe itching that is not helped by medicine.  You have painful or puffy joints. These problems may be the first sign of a life-threatening allergic reaction. Call your local emergency services (911 in [place] MAKE SURE YOU:   Understand these instructions.  Will watch your condition.  Will get help right away if you are not doing well or get worse. Document Released: 12/28/2007 Document Revised: 09/19/2011 Document Reviewed: 06/13/2011 [facility] Patient Information 2014 [place], [place].

## 2012-09-19 NOTE — Telephone Encounter (Signed)
Pt called with c/o circles on rt arm. Pt was seen in May for same complaint, given meds but no improvement. C/o itching, redness and areas enlarging.  Will see today

## 2012-09-19 NOTE — Assessment & Plan Note (Addendum)
>  6 weeks of both ringed and coalesced raised erythematous rash which has persisted despite treatment with cetirizine and clotrimazole. The rash is pruritic and rarely burning but never painful in nature. Given the persistent nature of the rash,its appearance and the lack of known exposures, this appears to be most consistent with chronic urticaria at this point. TSH was normal in December.  - CBC with diff, CMP, ESR, CRP, ANA, C3 and C4 - Referral to dermatology for skin biopsy in this patient on chronic warfarin. The patient will arrange this appointment since he appears to have an outstanding debt with the dermatology group. - Begin hydroxyzine 10mg  BID as needed for itching and rash. This may be increased to TID if there is no improvement in 1 week. Patient was counseled that this medication may make him sleepy.

## 2012-09-19 NOTE — Progress Notes (Signed)
This is a Psychologist, occupational Note.  The care of the patient was discussed with Dr. Rogelia Boga and the assessment and plan was formulated with their assistance.  Please see their note for official documentation of the patient encounter.   Subjective:   Patient ID: Dylan Morgan male   DOB: 03/17/1946 67 y.o.   MRN: [id number]  HPI: [name] is a 67 y.o. male who presents for evaluation of a chronic rash. Patient has previously been seen twice in clinic for the same, non-resolving rash. The rash first began 6 weeks ago and came on "all at once" on his arms, legs, trunk, and back. He has thus far tried cetirizine and clotrimazole cream to minimal benefit. He has seen no benefit from benedryl cream or calamine lotion. He denies recent exposures including changes in clothing, bed sheets, detergents, lotions, creams, or colognes. The patient resumed daily ASA 81mg  3-4 months ago after a brief cessation of this therapy.  He denies fevers, chills, rhinorrhea or congestion, lymphadenopathy, abdominal pain, or changes in bowel or bladder habits.  He does not acknowledged foot pains or burning today.   Past Medical History  Diagnosis Date  . CAD (coronary artery disease) CABG in 2004    Nuclear, October, 2010, no ischemia, EF 71%  . GERD (gastroesophageal reflux disease)   . Hypertension   . Hyperlipidemia   . Renal insufficiency   . Cellulitis and abscess of leg     right leg  . Tobacco abuse   . Chronic back pain   . Chronic neck pain   . Seborrheic dermatitis of scalp   . Abnormal LFTs     Hx of abnormal LFT's  . Bradycardia   . AAA (abdominal aortic aneurysm)     November 2010, Stenting, November, 2010  . Paroxysmal atrial fibrillation 02/22/2009    diltiazem po started but discontinued due to side effects.  . Ejection fraction     EF 40% in the past  /   EF 55-65% echo, November, 2006  . Hx of CABG     2004  . Carotid artery disease     Doppler, October, 2012, and old LICA  occlusion , RICA no significant abnormality  . Arthritis   . Prediabetes   . CVA (cerebral infarction)     R Carona radiata stroke 08/16/2011  . Stroke 11/26/11   Current Outpatient Prescriptions  Medication Sig Dispense Refill  . aspirin EC 81 MG EC tablet Take 1 tablet (81 mg total) by mouth daily.      . cetirizine (ZYRTEC) 10 MG tablet Take 1 tablet (10 mg total) by mouth daily.  100 tablet  1  . lisinopril (PRINIVIL,ZESTRIL) 10 MG tablet Take 1 tablet (10 mg total) by mouth at bedtime.  30 tablet  6  . Multiple Vitamins-Minerals (MULTIVITAMIN WITH MINERALS) tablet Take 1 tablet by mouth at bedtime.       . simvastatin (ZOCOR) 10 MG tablet Take 1 tablet (10 mg total) by mouth at bedtime.  90 tablet  6  . warfarin (COUMADIN) 5 MG tablet Take 2.5-5 mg by mouth at bedtime. MWF-0.5half tab (2.5 mg total), All other days-1 tab (5 mg total)      . clotrimazole (LOTRIMIN) 1 % cream Apply topically 2 (two) times daily.  60 g  0   No current facility-administered medications for this visit.   Family History  Problem Relation Age of Onset  . Diabetes Mother   . Stroke Father   .  Cancer Sister     thyroid cancer  . Heart disease Brother     Coronary artery disease   History   Social History  . Marital Status: Single    Spouse Name: N/A    Number of Children: N/A  . Years of Education: N/A   Occupational History  . disabled    Social History Main Topics  . Smoking status: Former Smoker    Types: Cigarettes    Quit date: 02/03/2010  . Smokeless tobacco: Never Used  . Alcohol Use: No  . Drug Use: No  . Sexually Active: No   Other Topics Concern  . None   Social History Narrative   Lives with son and his significant other, denies being married. Has 3 daughters & a son.  Several grandchildren.    Divorced.   Regular exercise.   Prior to retirement, hung dry wall for a living.    Review of Systems: A comprehensive 12 point review of systems was performed and is negative  except as stated above. Objective:  Physical Exam: Filed Vitals:   09/19/12 1508  BP: 133/84  Pulse: 97  Temp: 97 F (36.1 C)  TempSrc: Oral  Height: 5\' 11"  (1.803 m)  Weight: 248 lb 6.4 oz (112.674 kg)  SpO2: 96%   General appearance: alert, cooperative and no distress Head: Normocephalic, without obvious abnormality, atraumatic Eyes: anicteric sclera. arcus senilus greater in left eye than right. Throat: moist mucus membranes of oropharynx Back: symmetric with erythematous, urticarial appearing plaques and papules most concentrated at the waistline. Lungs: clear to auscultation bilaterally and normal work of breathing Heart: regular rate and rhythm, S1, S2 normal, no murmur, click, rub or gallop Abdomen: obese, soft, nontender. bowel sounds present. erythematous, urticarial type rash diffusely present with concentration along waist (belt line). Extremities: no cyanosis or edema. erythematous, urticarial appearing rash present in bilateral axilla. distinct ringed wheals present on left forearm with solid wheals on right upper arm. Pulses: 2+ and symmetric Skin: Skin color, texture, turgor normal. No rashes or lesions or diffuse urticarial rash as described above. Assessment & Plan:  NASIAH Morgan is a 67 y.o. male presenting for evaluation of a chronic urticarial-appearing rash.  Allergic urticaria >6 weeks of both ringed and coalesced raised erythematous rash which has persisted despite treatment with cetirizine and clotrimazole. The rash is pruritic and rarely burning but never painful in nature. Given the persistent nature of the rash,its appearance and the lack of known exposures, this appears to be most consistent with chronic urticaria at this point. TSH was normal in December.  - CBC with diff, CMP, ESR, CRP, ANA, C3 and C4 - Referral to dermatology for skin biopsy in this patient on chronic warfarin. The patient will arrange this appointment since he appears to have an  outstanding debt with the dermatology group. - Begin hydroxyzine 10mg  BID as needed for itching and rash. This may be increased to TID if there is no improvement in 1 week. Patient was counseled that this medication may make him sleepy.    FOLLOW UP: Return if problem recurs,  worsens, or new problem develops.

## 2012-09-20 ENCOUNTER — Encounter (HOSPITAL_COMMUNITY): Payer: Self-pay | Admitting: *Deleted

## 2012-09-20 ENCOUNTER — Emergency Department (HOSPITAL_COMMUNITY)
Admission: EM | Admit: 2012-09-20 | Discharge: 2012-09-20 | Disposition: A | Discharge status: Home / Self Care | Payer: Medicare Other | Attending: Emergency Medicine | Admitting: Emergency Medicine

## 2012-09-20 ENCOUNTER — Ambulatory Visit (INDEPENDENT_AMBULATORY_CARE_PROVIDER_SITE_OTHER): Payer: Medicare Other | Admitting: Cardiovascular Disease

## 2012-09-20 ENCOUNTER — Emergency Department (HOSPITAL_COMMUNITY): Payer: Medicare Other

## 2012-09-20 ENCOUNTER — Encounter (INDEPENDENT_AMBULATORY_CARE_PROVIDER_SITE_OTHER): Payer: Medicare Other

## 2012-09-20 DIAGNOSIS — I6350 Cerebral infarction due to unspecified occlusion or stenosis of unspecified cerebral artery: Secondary | ICD-10-CM

## 2012-09-20 DIAGNOSIS — Z8679 Personal history of other diseases of the circulatory system: Secondary | ICD-10-CM | POA: Insufficient documentation

## 2012-09-20 DIAGNOSIS — I4891 Unspecified atrial fibrillation: Secondary | ICD-10-CM | POA: Insufficient documentation

## 2012-09-20 DIAGNOSIS — Z951 Presence of aortocoronary bypass graft: Secondary | ICD-10-CM | POA: Insufficient documentation

## 2012-09-20 DIAGNOSIS — Z87448 Personal history of other diseases of urinary system: Secondary | ICD-10-CM | POA: Insufficient documentation

## 2012-09-20 DIAGNOSIS — E785 Hyperlipidemia, unspecified: Secondary | ICD-10-CM | POA: Insufficient documentation

## 2012-09-20 DIAGNOSIS — Z79899 Other long term (current) drug therapy: Secondary | ICD-10-CM | POA: Insufficient documentation

## 2012-09-20 DIAGNOSIS — Z7901 Long term (current) use of anticoagulants: Secondary | ICD-10-CM

## 2012-09-20 DIAGNOSIS — Z872 Personal history of diseases of the skin and subcutaneous tissue: Secondary | ICD-10-CM | POA: Insufficient documentation

## 2012-09-20 DIAGNOSIS — I498 Other specified cardiac arrhythmias: Secondary | ICD-10-CM | POA: Insufficient documentation

## 2012-09-20 DIAGNOSIS — K219 Gastro-esophageal reflux disease without esophagitis: Secondary | ICD-10-CM | POA: Insufficient documentation

## 2012-09-20 DIAGNOSIS — Z8673 Personal history of transient ischemic attack (TIA), and cerebral infarction without residual deficits: Secondary | ICD-10-CM | POA: Insufficient documentation

## 2012-09-20 DIAGNOSIS — M549 Dorsalgia, unspecified: Secondary | ICD-10-CM | POA: Insufficient documentation

## 2012-09-20 DIAGNOSIS — M129 Arthropathy, unspecified: Secondary | ICD-10-CM | POA: Insufficient documentation

## 2012-09-20 DIAGNOSIS — I480 Paroxysmal atrial fibrillation: Secondary | ICD-10-CM

## 2012-09-20 DIAGNOSIS — G8929 Other chronic pain: Secondary | ICD-10-CM | POA: Insufficient documentation

## 2012-09-20 DIAGNOSIS — I2510 Atherosclerotic heart disease of native coronary artery without angina pectoris: Secondary | ICD-10-CM | POA: Insufficient documentation

## 2012-09-20 DIAGNOSIS — Z87891 Personal history of nicotine dependence: Secondary | ICD-10-CM | POA: Insufficient documentation

## 2012-09-20 DIAGNOSIS — M542 Cervicalgia: Secondary | ICD-10-CM | POA: Insufficient documentation

## 2012-09-20 DIAGNOSIS — I10 Essential (primary) hypertension: Secondary | ICD-10-CM | POA: Insufficient documentation

## 2012-09-20 DIAGNOSIS — R109 Unspecified abdominal pain: Secondary | ICD-10-CM | POA: Insufficient documentation

## 2012-09-20 DIAGNOSIS — I639 Cerebral infarction, unspecified: Secondary | ICD-10-CM

## 2012-09-20 DIAGNOSIS — Z8669 Personal history of other diseases of the nervous system and sense organs: Secondary | ICD-10-CM | POA: Insufficient documentation

## 2012-09-20 DIAGNOSIS — Z7982 Long term (current) use of aspirin: Secondary | ICD-10-CM | POA: Insufficient documentation

## 2012-09-20 LAB — COMPLETE METABOLIC PANEL WITH GFR
Alkaline Phosphatase: 72 U/L (ref 39–117)
CO2: 24 mEq/L (ref 19–32)
Calcium: 9.6 mg/dL (ref 8.4–10.5)
Chloride: 107 mEq/L (ref 96–112)
Creat: 1.59 mg/dL — ABNORMAL HIGH (ref 0.50–1.35)
GFR, Est African American: 50 mL/min — ABNORMAL LOW
GFR, Est Non African American: 44 mL/min — ABNORMAL LOW
Glucose, Bld: 83 mg/dL (ref 70–99)
Total Bilirubin: 0.5 mg/dL (ref 0.3–1.2)
Total Protein: 7.4 g/dL (ref 6.0–8.3)

## 2012-09-20 LAB — C3 AND C4
C3 Complement: 147 mg/dL (ref 90–180)
C4 Complement: 21 mg/dL (ref 10–40)

## 2012-09-20 LAB — URINALYSIS, ROUTINE W REFLEX MICROSCOPIC
Bilirubin Urine: NEGATIVE
Ketones, ur: NEGATIVE mg/dL
Nitrite: NEGATIVE
Protein, ur: NEGATIVE mg/dL
Urobilinogen, UA: 1 mg/dL (ref 0.0–1.0)
pH: 5.5 (ref 5.0–8.0)

## 2012-09-20 LAB — ANA: Anti Nuclear Antibody(ANA): NEGATIVE

## 2012-09-20 MED ORDER — HYDROCODONE-ACETAMINOPHEN 5-325 MG PO TABS
2.0000 | ORAL_TABLET | Freq: Once | ORAL | Status: AC
  Administered 2012-09-20: 2 via ORAL
  Filled 2012-09-20: qty 2

## 2012-09-20 MED ORDER — IBUPROFEN 200 MG PO TABS: 600.0000 mg | ORAL_TABLET | Freq: Once | ORAL | Status: DC

## 2012-09-20 MED ORDER — HYDROCODONE-ACETAMINOPHEN 5-325 MG PO TABS: 1.0000 | ORAL_TABLET | Freq: Four times a day (QID) | ORAL | Status: DC | PRN

## 2012-09-20 MED ORDER — IOHEXOL 350 MG/ML SOLN
80.0000 mL | Freq: Once | INTRAVENOUS | Status: AC | PRN
  Administered 2012-09-20: 80 mL via INTRAVENOUS

## 2012-09-20 MED ORDER — FENTANYL CITRATE 0.05 MG/ML IJ SOLN
50.0000 ug | Freq: Once | INTRAMUSCULAR | Status: AC
  Administered 2012-09-20: 50 ug via INTRAVENOUS
  Filled 2012-09-20: qty 2

## 2012-09-20 NOTE — ED Provider Notes (Signed)
History     CSN: [id number]  Arrival date & time 09/20/12  0050   First MD Initiated Contact with Patient 09/20/12 0413      Chief Complaint  Patient presents with  . Back Pain     Patient is a 67 y.o. male presenting with back pain. The history is provided by the patient.  Back Pain Location:  Sacro-iliac joint Quality:  Aching Radiates to:  R knee and R thigh Pain severity:  Moderate Onset quality:  Gradual Timing:  Constant Progression:  Worsening Chronicity:  Recurrent Relieved by:  Bed rest Worsened by:  Movement Associated symptoms: no abdominal pain, no chest pain, no fever, no weakness and no weight loss    Pt denies fever Denies fall or trauma Pain was gradual in onset No leg weakness  No abdominal pain is reported Past Medical History  Diagnosis Date  . CAD (coronary artery disease) CABG in 2004    Nuclear, October, 2010, no ischemia, EF 71%  . GERD (gastroesophageal reflux disease)   . Hypertension   . Hyperlipidemia   . Renal insufficiency   . Cellulitis and abscess of leg     right leg  . Tobacco abuse   . Chronic back pain   . Chronic neck pain   . Seborrheic dermatitis of scalp   . Abnormal LFTs     Hx of abnormal LFT's  . Bradycardia   . AAA (abdominal aortic aneurysm)     November 2010, Stenting, November, 2010  . Paroxysmal atrial fibrillation 02/22/2009    diltiazem po started but discontinued due to side effects.  . Ejection fraction     EF 40% in the past  /   EF 55-65% echo, November, 2006  . Hx of CABG     2004  . Carotid artery disease     Doppler, October, 2012, and old LICA occlusion , RICA no significant abnormality  . Arthritis   . Prediabetes   . CVA (cerebral infarction)     R Carona radiata stroke 08/16/2011  . Stroke 11/26/11    Past Surgical History  Procedure Laterality Date  . Coronary artery bypass graft  2004  . Rotator cuff repair    . Abdominal aortic aneurysm repair  2010    Aortic stent repair    Family  History  Problem Relation Age of Onset  . Diabetes Mother   . Stroke Father   . Cancer Sister     thyroid cancer  . Heart disease Brother     Coronary artery disease    History  Substance Use Topics  . Smoking status: Former Smoker    Types: Cigarettes    Quit date: 02/03/2010  . Smokeless tobacco: Never Used  . Alcohol Use: No      Review of Systems  Constitutional: Negative for fever and weight loss.  Cardiovascular: Negative for chest pain.  Gastrointestinal: Negative for abdominal pain.  Musculoskeletal: Positive for back pain.  Neurological: Negative for weakness.  All other systems reviewed and are negative.    Allergies  Diltiazem hcl and Oxycodone-acetaminophen  Home Medications   Current Outpatient Rx  Name  Route  Sig  Dispense  Refill  . aspirin EC 81 MG EC tablet   Oral   Take 1 tablet (81 mg total) by mouth daily.         . cetirizine (ZYRTEC) 10 MG tablet   Oral   Take 1 tablet (10 mg total) by mouth daily.  100 tablet   1   . clotrimazole (LOTRIMIN) 1 % cream   Topical   Apply topically 2 (two) times daily.   60 g   0   . hydrOXYzine (ATARAX/VISTARIL) 10 MG tablet   Oral   Take 1 tablet (10 mg total) by mouth 2 (two) times daily as needed for itching.   60 tablet   1   . lisinopril (PRINIVIL,ZESTRIL) 10 MG tablet   Oral   Take 1 tablet (10 mg total) by mouth at bedtime.   30 tablet   6   . Multiple Vitamins-Minerals (MULTIVITAMIN WITH MINERALS) tablet   Oral   Take 1 tablet by mouth at bedtime.          . simvastatin (ZOCOR) 10 MG tablet   Oral   Take 1 tablet (10 mg total) by mouth at bedtime.   90 tablet   6   . warfarin (COUMADIN) 5 MG tablet   Oral   Take 2.5-5 mg by mouth at bedtime. MWF-0.5half tab (2.5 mg total), All other days-1 tab (5 mg total)           BP 95/65  Pulse 71  Temp(Src) 98.9 F (37.2 C) (Oral)  Resp 18  Ht 5\' 11"  (1.803 m)  Wt 248 lb (112.492 kg)  BMI 34.6 kg/m2  SpO2 96%  Physical  Exam CONSTITUTIONAL: Well developed/well nourished HEAD: Normocephalic/atraumatic EYES: EOMI/PERRL ENMT: Mucous membranes moist NECK: supple no meningeal signs SPINE:entire spine nontender, No bruising/crepitance/stepoffs noted to spine Tenderness in lumbar paraspinal region CV: S1/S2 noted, no murmurs/rubs/gallops noted LUNGS: Lungs are clear to auscultation bilaterally, no apparent distress ABDOMEN: soft, nontender, no rebound or guarding GU:no cva tenderness NEURO: Pt is awake/alert, moves all extremitiesx4, able to flex/extend both hips/knees without difficulty EXTREMITIES: pulses normal, full ROM SKIN: warm, color normal PSYCH: no abnormalities of mood noted  ED Course  Procedures   Labs Reviewed  PROTIME-INR - Abnormal; Notable for the following:    Prothrombin Time 30.4 (*)    INR 3.12 (*)    All other components within normal limits  URINALYSIS, ROUTINE W REFLEX MICROSCOPIC   Dg Lumbar Spine Complete  09/20/2012   *RADIOLOGY REPORT*  Clinical Data: Back pain.  LUMBAR SPINE - COMPLETE 4+ VIEW  Comparison: CT of the abdomen and pelvis 07/02/2012.  Findings: Five views of the lumbar spine demonstrate no definite acute displaced fracture or compression type fracture.  No defects of the pars interarticularis are noted.  Multilevel degenerative disc disease is noted, most severe at T11-T12, T12-L1, L1-L2 and L5- S1.  Multilevel facet arthropathy, most severe L4-L5 and L5-S1. Aortobi-iliac stent graft noted.  Calcified abdominal aortic aneurysm sac.  IMPRESSION: 1.  No acute radiographic abnormality of the lumbar spine. 2.  Multilevel degenerative disc disease and lumbar spondylosis, as above.   Original Report Authenticated By: Trudie Reed, M.D.   6:30 AM Pt with gradual onset of low back pain. Does not recall injury He has h/o AAA and is on coumadin, he also had mild hypotension in the ED.  Will obtain CT imaging to r/o any acute pathology related to his AAA 7:35 AM Pt  improved He has no focal neuro deficits CT imaging does not reveal any complications from AAA He feels well for d/c We discussed strict return precautions Pt given pain meds over 3 hrs ago, and he feels awake/alert and comfortable to drive He did receive reduced dose of IV contrast after speaking to CT tech  MDM  Nursing notes including past medical history and social history reviewed and considered in documentation Labs/vital reviewed and considered xrays reviewed and considered Previous records reviewed and considered - recent labs reviewed         Joya Gaskins, MD 09/20/12 [phone]

## 2012-09-20 NOTE — ED Notes (Signed)
Patient transported to CT 

## 2012-09-20 NOTE — ED Notes (Signed)
2nd RN attempted to start IV on patient. Unsuccessful. IV team notified.

## 2012-09-20 NOTE — ED Notes (Addendum)
Pt states history of chronic back pain. Pt states that his typical back pain is in his mid back, but this pain is in his right back near his right hip. Pt states that it started at 10:00 this morning. Pt denies taking medication for pain. Pt denies known reason for the pain, no injury, no falls, no lifting heavy objects. Pt denies hematuria, but states that his pain is intermittant with urination. (pain will stop after urinating and come back after not urinating)

## 2012-09-20 NOTE — Progress Notes (Signed)
  Subjective:    Patient ID: Dylan Morgan, male    DOB: 07-06-1945, 67 y.o.   MRN: [id number]  Rash Pertinent negatives include no fever or sore throat.    Dylan Morgan is a 67 yo who is seen here for a rash that has been present since 5/9 2014. At that visit, Dr Allena Katz felt it was Allergic urticaria and tx with Zyrtec. Sxs persisted and seen 5/19 and again felt to be c/w urticaria and cont zyrtec but added clotrimazole cream just in case it was fungal in nature (and had fungal like changes to feet). Pt used the cream with only a slight improvement and returns today. There was no inciting event that started the rash. It is not spreading but does involvement almost all skin surfaces except face, hands, and feet. It is a burning and an itching. No new exposures except ASA 3 months ago that he had been on previously. No h/o psoriasis.   Review of Systems  Constitutional: Negative for fever.  HENT: Negative for sore throat and trouble swallowing.   Musculoskeletal: Negative for myalgias and arthralgias.  Skin: Positive for rash.  Neurological: Negative for weakness.       Objective:   Physical Exam  Constitutional: He is oriented to person, place, and time. He appears well-developed and well-nourished. No distress.  HENT:  Head: Normocephalic and atraumatic.  Right Ear: External ear normal.  Left Ear: External ear normal.  Nose: Nose normal.  Eyes: Conjunctivae and EOM are normal.  Pulmonary/Chest: Effort normal.  Musculoskeletal: Normal range of motion. He exhibits no edema and no tenderness.  Neurological: He is alert and oriented to person, place, and time.  Skin: Skin is warm and dry. Rash noted. He is not diaphoretic. No erythema. No pallor.  There are plaques on the elbows L > R. There is hypertrophic skin over knees (prior work involved being on knees). There are a few circumscribed erythematous lesions with central pallor otherwise, the lesions are few mm to cm erythematous that  collasce.   Psychiatric: He has a normal mood and affect. His behavior is normal. Judgment and thought content normal.          Assessment & Plan:  1. Rash, presumed chronic urticaria - We reviewed the MKSAP and UTD info and checked rec labs. He had a nl TSH about 6 months ago so did not repeat. There is no apparent malignancy in [place]. Will changed H1 blocker to see if more effective. Since this is now chronic, we discussed bc but he is anticoag with warfarin, so will defer to Derm. Will make Derm referral to consider bx and confirm dx.

## 2012-09-20 NOTE — ED Notes (Signed)
Attempted IV twice. Not successful. Another RN notified to try

## 2012-09-20 NOTE — ED Notes (Signed)
IV team at bedside to start IV on patient

## 2012-09-24 ENCOUNTER — Other Ambulatory Visit: Payer: Self-pay | Admitting: Cardiology

## 2012-09-26 ENCOUNTER — Encounter: Payer: Self-pay | Admitting: Internal Medicine

## 2012-09-26 ENCOUNTER — Encounter: Payer: Self-pay | Admitting: *Deleted

## 2012-09-26 ENCOUNTER — Ambulatory Visit (INDEPENDENT_AMBULATORY_CARE_PROVIDER_SITE_OTHER): Payer: Medicare Other | Admitting: Internal Medicine

## 2012-09-26 VITALS — BP 108/70 | HR 74 | Temp 97.2°F | Ht 71.0 in | Wt 247.7 lb

## 2012-09-26 DIAGNOSIS — L500 Allergic urticaria: Secondary | ICD-10-CM

## 2012-09-26 NOTE — Assessment & Plan Note (Addendum)
The patient again presents for the rash, which has been present for the last 6 weeks.  Work-up has been negative to date.  Allergic urticaria is the leading diagnosis, though without obvious trigger.  Eczema vs psoriasis is another possibility.  Skin biopsy is likely the next step needed for diagnosis, but given his use of warfarin, we're concerned about the risk of bleeding, which could be difficult for [place] to stop (no electrocautery, limited chemical cautery options).  As such, we have referred to dermatology.  The patient plans to make an appointment soon.  If he is unable to do so, another option is to stop anticoagulation, which he is on for afib, given that the day-to-day risk of clot formation is low, then restart after the biopsy. -continue hydroxyzine as needed for itching. -f/u with dermatology

## 2012-09-26 NOTE — Progress Notes (Signed)
Agree with plan 

## 2012-09-26 NOTE — Progress Notes (Signed)
Pt walked into clinic, he was told to return if not better.  He did not realize he needed an appointment.   He was seen for rash on left arm, rt arm and rt leg.  Area's do not itch, but he returns because no improvement with S/S.   Will see at 3:30 today

## 2012-09-26 NOTE — Patient Instructions (Addendum)
General Instructions: For your rash, the next best step for diagnosis will be to take a skin biopsy, which we would like to be done by a dermatologist.  Your blood pressure is well-controlled today  Please return for a follow-up visit in 4 weeks.   Treatment Goals:  Goals (1 Years of Data) as of 09/26/12   None      Progress Toward Treatment Goals:  Treatment Goal 09/26/2012  Blood pressure at goal    Self Care Goals & Plans:  Self Care Goal 09/26/2012  Manage my medications take my medicines as prescribed; bring my medications to every visit; refill my medications on time  Monitor my health keep track of my blood pressure; keep track of my weight  Eat healthy foods drink diet soda or water instead of juice or soda; eat foods that are low in salt; eat baked foods instead of fried foods  Be physically active take a walk every day; find an activity I enjoy       Care Management & Community Referrals:  Referral 09/26/2012  Referrals made for care management support none needed

## 2012-09-26 NOTE — Progress Notes (Signed)
HPI The patient is a 67 y.o. male with a history of CKD, CAD, prior CVA, presenting for an ED follow-up.  The patient was also recently in the ED for right hip pain, which has been present for the last few days.  The pain started after a day when he was helping a friend with some manual labor, involving walking up and down a ladder.  Imaging, including DG lumbar spine and CT abd/pelvis was unremarkable.  The patient states that the pain has improved.  The patient also presents for follow-up of rash.  The patient notes a 6-week history of a red rash on his bilateral legs and arms.  The rash consists of erythematous patches and papules, which occasionally itch, but do not hurt.  He has been seen in clinic several times for this, and work-up has included CMP, CBC, ESR, CRP, ANA, complement levels, all of which have been normal.  Treatments have included cetirizine, clotrimazole, benadryl cream, benadryl PO, calamine lotion, and hydroxyzine.  At his last visit, he was referred to dermatology for consideration of punch biopsy (due to him being on chronic anticoagulation, and the limited means we have in our clinic for stopping an uncontrolled bleed), but he is waiting to pay off a prior balance of $20 before going back to his dermatologist, which he plans to pay next week.  ROS: General: no fevers, chills, changes in weight, changes in appetite Skin: see HPI HEENT: no blurry vision, hearing changes, sore throat Pulm: no dyspnea, coughing, wheezing CV: no chest pain, palpitations, shortness of breath Abd: no abdominal pain, nausea/vomiting, diarrhea/constipation GU: no dysuria, hematuria, polyuria Ext: no arthralgias, myalgias Neuro: no weakness, numbness, or tingling  Filed Vitals:   09/26/12 1545  BP: 108/70  Pulse: 74  Temp: 97.2 F (36.2 C)    PEX General: alert, cooperative, and in no apparent distress HEENT: pupils equal round and reactive to light, vision grossly intact, oropharynx clear  and non-erythematous  Neck: supple, no lymphadenopathy Lungs: clear to ascultation bilaterally, normal work of respiration, no wheezes, rales, ronchi Heart: regular rate and rhythm, no murmurs, gallops, or rubs Abdomen: soft, non-tender, non-distended, normal bowel sounds Extremities: bilateral arms and legs with erythematous patches, which become confluent at places Neurologic: alert & oriented X3, cranial nerves II-XII intact, strength grossly intact, sensation intact to light touch  Current Outpatient Prescriptions on File Prior to Visit  Medication Sig Dispense Refill  . aspirin EC 81 MG EC tablet Take 1 tablet (81 mg total) by mouth daily.      . cetirizine (ZYRTEC) 10 MG tablet Take 1 tablet (10 mg total) by mouth daily.  100 tablet  1  . clotrimazole (LOTRIMIN) 1 % cream Apply topically 2 (two) times daily.  60 g  0  . HYDROcodone-acetaminophen (NORCO/VICODIN) 5-325 MG per tablet Take 1 tablet by mouth every 6 (six) hours as needed for pain.  15 tablet  0  . hydrOXYzine (ATARAX/VISTARIL) 10 MG tablet Take 1 tablet (10 mg total) by mouth 2 (two) times daily as needed for itching.  60 tablet  1  . lisinopril (PRINIVIL,ZESTRIL) 10 MG tablet Take 1 tablet (10 mg total) by mouth at bedtime.  30 tablet  6  . Multiple Vitamins-Minerals (MULTIVITAMIN WITH MINERALS) tablet Take 1 tablet by mouth at bedtime.       . simvastatin (ZOCOR) 10 MG tablet Take 1 tablet (10 mg total) by mouth at bedtime.  90 tablet  6  . warfarin (COUMADIN) 5 MG tablet  Take 1 tablet (5 mg total) by mouth as directed.  30 tablet  3   No current facility-administered medications on file prior to visit.    Assessment/Plan

## 2012-10-02 NOTE — Progress Notes (Signed)
Case discussed with Dr. Lineback at the time of the visit.  We reviewed the resident's history and exam and pertinent patient test results.  I agree with the assessment, diagnosis, and plan of care documented in the resident's note. 

## 2012-10-09 ENCOUNTER — Ambulatory Visit (INDEPENDENT_AMBULATORY_CARE_PROVIDER_SITE_OTHER): Payer: Medicare Other | Admitting: Internal Medicine

## 2012-10-09 ENCOUNTER — Encounter: Payer: Self-pay | Admitting: Internal Medicine

## 2012-10-09 VITALS — BP 116/77 | HR 58 | Temp 97.9°F | Ht 71.0 in | Wt 249.1 lb

## 2012-10-09 DIAGNOSIS — I10 Essential (primary) hypertension: Secondary | ICD-10-CM

## 2012-10-09 DIAGNOSIS — R21 Rash and other nonspecific skin eruption: Secondary | ICD-10-CM

## 2012-10-09 MED ORDER — PERMETHRIN 5 % EX CREA: TOPICAL_CREAM | CUTANEOUS | Status: DC

## 2012-10-09 NOTE — Assessment & Plan Note (Signed)
Diffuse erythematous macular rash all over body.  Previously though to have allergic urticaria but says the itching has gotten worse.  Has dogs at home but have treated pets and home for fleas and bed bugs.  No relief with topical ointments and anti-histamines.  Thinks he may have scabies, no track marks in webs of hands and between toes.  One other person in household also itching, especially at night.  -will treat for presumed scabies at this time with permethrin 5% cream -instructions on proper use of cream, treatment of household and clothes and bedding all explained -NEEDS TO SEE DERMATOLOGIST ASAP and hopefully acquire enough money for the copay, he said he will try to do that  -recommended if he notices any bugs on his bed, to try to capture and bring to appointment -follow up in at least 2 weeks if no better but would have hopefully seen dermatologist by then -advised if rash gets worse or more severe, or notices hives, shortness of breath, fever, and other symptoms to return right away or go to ED if severe

## 2012-10-09 NOTE — Progress Notes (Signed)
Subjective:   Patient ID: Dylan Morgan male   DOB: 01-02-1946 67 y.o.   MRN: [id number]  HPI: [name] is a 67 y.o. white male with PMH of CKD, CAD, and prior CVA presenting to clinic today for complaints of continued itching and diffuse body rash.  He says over the past 6 or 7 days the itching has gotten worse, especially at night times and the rash is everywhere causing itching everywhere on his body even his head.  He thinks he has scabies after searching on the internet for quite some time. One other person in the household is also itchy.  His son has dogs at home and he says he has treated them for fleas and has also treated the house for bed bugs and fleas as well but the itching continues.  He has tried over the counter medications including antihistamines and topical agents.  He was last seen in [facility] by Dr. Manson Passey on 09/26/12 for presumed allergic urticaria and urged to go to the dermatologist which he was going to do.  However, today he reports he still has not seen the dermatologist due to needing to pay a co-pay and prior balance.  I urged him to see the dermatologist again ans he may benefit from skin biopsy as Dr. Manson Passey explained on last visit.  He said he would try to gather the money and make an appointment.  Past Medical History  Diagnosis Date  . CAD (coronary artery disease) CABG in 2004    Nuclear, October, 2010, no ischemia, EF 71%  . GERD (gastroesophageal reflux disease)   . Hypertension   . Hyperlipidemia   . Renal insufficiency   . Cellulitis and abscess of leg     right leg  . Tobacco abuse   . Chronic back pain   . Chronic neck pain   . Seborrheic dermatitis of scalp   . Abnormal LFTs     Hx of abnormal LFT's  . Bradycardia   . AAA (abdominal aortic aneurysm)     November 2010, [place], November, 2010  . Paroxysmal atrial fibrillation 02/22/2009    diltiazem po started but discontinued due to side effects.  . Ejection fraction     EF 40% in the past  /    EF 55-65% echo, November, 2006  . Hx of CABG     2004  . Carotid artery disease     Doppler, October, 2012, and old LICA occlusion , RICA no significant abnormality  . Arthritis   . Prediabetes   . CVA (cerebral infarction)     R Carona radiata stroke 08/16/2011  . Stroke 11/26/11   Current Outpatient Prescriptions  Medication Sig Dispense Refill  . aspirin EC 81 MG EC tablet Take 1 tablet (81 mg total) by mouth daily.      . cetirizine (ZYRTEC) 10 MG tablet Take 1 tablet (10 mg total) by mouth daily.  100 tablet  1  . clotrimazole (LOTRIMIN) 1 % cream Apply topically 2 (two) times daily.  60 g  0  . HYDROcodone-acetaminophen (NORCO/VICODIN) 5-325 MG per tablet Take 1 tablet by mouth every 6 (six) hours as needed for pain.  15 tablet  0  . hydrOXYzine (ATARAX/VISTARIL) 10 MG tablet Take 1 tablet (10 mg total) by mouth 2 (two) times daily as needed for itching.  60 tablet  1  . lisinopril (PRINIVIL,ZESTRIL) 10 MG tablet Take 1 tablet (10 mg total) by mouth at bedtime.  30 tablet  6  . Multiple Vitamins-Minerals (MULTIVITAMIN WITH MINERALS) tablet Take 1 tablet by mouth at bedtime.       . simvastatin (ZOCOR) 10 MG tablet Take 1 tablet (10 mg total) by mouth at bedtime.  90 tablet  6  . warfarin (COUMADIN) 5 MG tablet Take 1 tablet (5 mg total) by mouth as directed.  30 tablet  3   No current facility-administered medications for this visit.   Family History  Problem Relation Age of Onset  . Diabetes Mother   . Stroke Father   . Cancer Sister     thyroid cancer  . Heart disease Brother     Coronary artery disease   History   Social History  . Marital Status: Single    Spouse Name: N/A    Number of Children: N/A  . Years of Education: N/A   Occupational History  . disabled    Social History Main Topics  . Smoking status: Former Smoker    Types: Cigarettes    Quit date: 02/03/2010  . Smokeless tobacco: Never Used  . Alcohol Use: No  . Drug Use: No  . Sexually Active:  None   Other Topics Concern  . None   Social History Narrative   Lives with son and his significant other, denies being married. Has 3 daughters & a son.  Several grandchildren.    Divorced.   Regular exercise.   Prior to retirement, hung dry wall for a living.    Review of Systems:  Constitutional:  Denies fever, chills, diaphoresis, appetite change and fatigue.   HEENT:  Denies congestion, sore throat, rhinorrhea, sneezing, mouth sores, trouble swallowing, neck pain   Respiratory:  Denies SOB, DOE, cough, and wheezing.   Cardiovascular:  Denies palpitations and leg swelling.   Gastrointestinal:  Denies nausea, vomiting, abdominal pain, diarrhea, constipation, blood in stool and abdominal distention.   Genitourinary:  Denies dysuria, urgency, frequency, hematuria, flank pain and difficulty urinating.   Musculoskeletal:  Denies myalgias, back pain, joint swelling, arthralgias and gait problem.   Skin:  Diffuse erythematous macular rash, severe pruritus  Neurological:  Denies dizziness, seizures, syncope, weakness, light-headedness, numbness and headaches.    Objective:  Physical Exam: Filed Vitals:   10/09/12 1322  BP: 116/77  Pulse: 58  Temp: 97.9 F (36.6 C)  TempSrc: Oral  Height: 5\' 11"  (1.803 m)  Weight: 249 lb 1.6 oz (112.991 kg)  SpO2: 99%   Vitals reviewed. General: sitting in chair,acute distress due to intense itching HEENT: PERRL, EOMI,  Cardiac: RRR, no rubs, murmurs or gallops Pulm: clear to auscultation bilaterally, no wheezes, rales, or rhonchi Abd: soft, nontender, obese, nondistended, BS present, +numerous widespread red macules  Ext: warm and well perfused, no pedal edema, +2DP B/L.  Malodorous and discolored toe nails Neuro: alert and oriented X3, cranial nerves II-XII grossly intact, strength and sensation to light touch equal in bilateral upper and lower extremities Skin: diffuse erythematous macules on entire body including b/l arms and legs, abdomen,  chest, and back.  Few papules on inner surface of b/l arms also erythematous.  Confluent area across lower abdomen of itching and redness at site of belt line.  No visible track marks between fingers and toes.  Few small macules on head as well.   Assessment & Plan:  Discussed with Dr. Meredith Pel  ?scabies--prescribed permethrin cream. NEEDS TO SEE DERMATOLOGIST

## 2012-10-09 NOTE — Assessment & Plan Note (Signed)
BP Readings from Last 3 Encounters:  10/09/12 116/77  09/26/12 108/70  09/20/12 125/74   Lab Results  Component Value Date   NA 141 09/19/2012   K 4.9 09/19/2012   CREATININE 1.59* 09/19/2012    Assessment: Blood pressure control: controlled Progress toward BP goal:  at goal  Plan: Medications:  continue current medications Lisinopril 10mg  qhs

## 2012-10-09 NOTE — Patient Instructions (Addendum)
General Instructions: Please Apply permethrin cream from head to toe, be careful NOT to touch your face especially eyes and mouth; leave on for 8-14 hours before washing off with water; may reapply in 1 week if live mites appear  If your itching or rash gets worse, call the clinic or dermatologist office as soon as possible or if severe, go to ED or if you notice hives or shortness of breath fever or chills and other symptoms  Please make sure everyone in your home is treated and all items used within several days before treatment (clothing, linens, stuffed animals, etc.) include placing in a plastic bag for at least three days, machine washing with hot water and then ironing or drying in a hot dryer, or dry cleaning.  Continue to treat your pets and home for possible fleas and bed bugs and maintain good hygiene as well, washing hands and showers.   You can also try a non-sedating antihistamine found over the counter to help with itching after treatment  PLEASE GO SEE YOUR DERMATOLOGIST AS SOON AS POSSIBLE, call and make an appointment TODAY  Treatment Goals:  Goals (1 Years of Data) as of 10/09/12   None      Progress Toward Treatment Goals:  Treatment Goal 10/09/2012  Blood pressure at goal    Self Care Goals & Plans:  Self Care Goal 10/09/2012  Manage my medications take my medicines as prescribed; bring my medications to every visit  Monitor my health -  Eat healthy foods -  Be physically active -    Care Management & Community Referrals:  Referral 09/26/2012  Referrals made for care management support none needed    Scabies Scabies are small bugs (mites) that burrow under the skin and cause red bumps and severe itching. These bugs can only be seen with a microscope. Scabies are highly contagious. They can spread easily from person to person by direct contact. They are also spread through sharing clothing or linens that have the scabies mites living in them. It is not unusual  for an entire family to become infected through shared towels, clothing, or bedding.  HOME CARE INSTRUCTIONS   Your caregiver may prescribe a cream or lotion to kill the mites. If cream is prescribed, massage the cream into the entire body from the neck to the bottom of both feet. Also massage the cream into the scalp and face if your child is less than 36 year old. Avoid the eyes and mouth. Do not wash your hands after application.  Leave the cream on for 8 to 12 hours. Your child should bathe or shower after the 8 to 12 hour application period. Sometimes it is helpful to apply the cream to your child right before bedtime.  One treatment is usually effective and will eliminate approximately 95% of infestations. For severe cases, your caregiver may decide to repeat the treatment in 1 week. Everyone in your household should be treated with one application of the cream.  New rashes or burrows should not appear within 24 to 48 hours after successful treatment. However, the itching and rash may last for 2 to 4 weeks after successful treatment. Your caregiver may prescribe a medicine to help with the itching or to help the rash go away more quickly.  Scabies can live on clothing or linens for up to 3 days. All of your child's recently used clothing, towels, stuffed toys, and bed linens should be washed in hot water and then dried in a  dryer for at least 20 minutes on high heat. Items that cannot be washed should be enclosed in a plastic bag for at least 3 days.  To help relieve itching, bathe your child in a cool bath or apply cool washcloths to the affected areas.  Your child may return to school after treatment with the prescribed cream. SEEK MEDICAL CARE IF:   The itching persists longer than 4 weeks after treatment.  The rash spreads or becomes infected. Signs of infection include red blisters or yellow-tan crust. Document Released: 03/20/2005 Document Revised: 06/12/2011 Document Reviewed:  07/29/2008 [facility] Patient Information 2014 [place], [place].  Itching Itching is a symptom that can be caused by many things. These include skin problems (including infections) as well as some internal diseases.  If the itching is affecting just one area of the body, it is most likely due to a common skin problem, such as:  Poison oak and poison ivy.  Contact dermatitis (skin irritation from a plant, chemicals, fiberglass, detergents, new cosmetic, new jewelry, or other substance).  Fungus (such as athlete's foot, jock itch, or ringworm).  Head lice  Dandruff  Insect bite  Infection (such as Shingles or other virus infections). If the itching is all over (widespread), the possible causes are many. These include:   Dry skin or eczema  Heat rash  Hives  Liver disorders  Kidney disorders TREATMENT  Localized itching   Lubrication of the skin. Use an ointment or cream or other unperfumed moisturizers if the skin is dry. Apply frequently, especially after bathing.  Anti-itch medicines. These medications may help control the urge to scratch. Scratching always makes itching worse and increases the chance of getting an infection.  Cortisone creams and ointments. These help reduce the inflammation.  Antibiotics. Skin infections can cause itching. Topical or oral antibiotics may be needed for 10 to 20 days to get rid of an infection. If you can identify what caused the itching, avoid this substance in the future.  Widespread itching  The following measures may help to relieve itching regardless of the cause:   Wash the skin once with soap to remove irritants.  Bathe in tepid water with baking soda, cornstarch, or oatmeal.  Use calamine lotion (nonprescription) or a baking soda solution (1 teaspoon in 4 ounces of water on the skin).  Apply 1% hydrocortisone cream (no prescription needed). Do not use this if there might be a skin infection.  Avoid scratching.  Avoid itchy  or tight-fitting clothes.  Avoid excessive heat, sweating, scented soaps, and swimming pools.  The lubricants, anti-itch medicines, etc. noted above may be helpful for controlling symptoms. SEEK MEDICAL CARE IF:   The itching becomes severe.  Your itch is not better after 1 week of treatment. Contact your caregiver to schedule further evaluation. Document Released: 03/20/2005 Document Revised: 06/12/2011 Document Reviewed: 09/07/2006 [facility] Patient Information 2014 [place], [place].

## 2012-10-10 ENCOUNTER — Ambulatory Visit (INDEPENDENT_AMBULATORY_CARE_PROVIDER_SITE_OTHER): Payer: Medicare Other | Admitting: *Deleted

## 2012-10-10 DIAGNOSIS — I639 Cerebral infarction, unspecified: Secondary | ICD-10-CM

## 2012-10-10 DIAGNOSIS — I4891 Unspecified atrial fibrillation: Secondary | ICD-10-CM

## 2012-10-10 DIAGNOSIS — I480 Paroxysmal atrial fibrillation: Secondary | ICD-10-CM

## 2012-10-10 DIAGNOSIS — I6350 Cerebral infarction due to unspecified occlusion or stenosis of unspecified cerebral artery: Secondary | ICD-10-CM

## 2012-10-10 DIAGNOSIS — Z7901 Long term (current) use of anticoagulants: Secondary | ICD-10-CM

## 2012-10-10 LAB — POCT INR: INR: 2.4

## 2012-10-10 NOTE — Progress Notes (Signed)
Case discussed with Dr. Moors at the time of the visit.  We reviewed the resident's history and exam and pertinent patient test results.  I agree with the assessment, diagnosis, and plan of care documented in the resident's note. 

## 2012-10-18 ENCOUNTER — Emergency Department (HOSPITAL_COMMUNITY)
Admission: EM | Admit: 2012-10-18 | Discharge: 2012-10-18 | Disposition: A | Discharge status: Home / Self Care | Payer: Medicare Other | Attending: Emergency Medicine | Admitting: Emergency Medicine

## 2012-10-18 ENCOUNTER — Encounter (HOSPITAL_COMMUNITY): Payer: Self-pay | Admitting: *Deleted

## 2012-10-18 DIAGNOSIS — R109 Unspecified abdominal pain: Secondary | ICD-10-CM | POA: Insufficient documentation

## 2012-10-18 DIAGNOSIS — I4891 Unspecified atrial fibrillation: Secondary | ICD-10-CM | POA: Insufficient documentation

## 2012-10-18 DIAGNOSIS — I252 Old myocardial infarction: Secondary | ICD-10-CM | POA: Insufficient documentation

## 2012-10-18 DIAGNOSIS — M129 Arthropathy, unspecified: Secondary | ICD-10-CM | POA: Insufficient documentation

## 2012-10-18 DIAGNOSIS — Z7982 Long term (current) use of aspirin: Secondary | ICD-10-CM | POA: Insufficient documentation

## 2012-10-18 DIAGNOSIS — Z8679 Personal history of other diseases of the circulatory system: Secondary | ICD-10-CM | POA: Insufficient documentation

## 2012-10-18 DIAGNOSIS — Z8673 Personal history of transient ischemic attack (TIA), and cerebral infarction without residual deficits: Secondary | ICD-10-CM | POA: Insufficient documentation

## 2012-10-18 DIAGNOSIS — E119 Type 2 diabetes mellitus without complications: Secondary | ICD-10-CM | POA: Insufficient documentation

## 2012-10-18 DIAGNOSIS — I2510 Atherosclerotic heart disease of native coronary artery without angina pectoris: Secondary | ICD-10-CM | POA: Insufficient documentation

## 2012-10-18 DIAGNOSIS — Z8719 Personal history of other diseases of the digestive system: Secondary | ICD-10-CM | POA: Insufficient documentation

## 2012-10-18 DIAGNOSIS — I129 Hypertensive chronic kidney disease with stage 1 through stage 4 chronic kidney disease, or unspecified chronic kidney disease: Secondary | ICD-10-CM | POA: Insufficient documentation

## 2012-10-18 DIAGNOSIS — Z872 Personal history of diseases of the skin and subcutaneous tissue: Secondary | ICD-10-CM | POA: Insufficient documentation

## 2012-10-18 DIAGNOSIS — E785 Hyperlipidemia, unspecified: Secondary | ICD-10-CM | POA: Insufficient documentation

## 2012-10-18 DIAGNOSIS — Z87891 Personal history of nicotine dependence: Secondary | ICD-10-CM | POA: Insufficient documentation

## 2012-10-18 DIAGNOSIS — N189 Chronic kidney disease, unspecified: Secondary | ICD-10-CM | POA: Insufficient documentation

## 2012-10-18 DIAGNOSIS — Z951 Presence of aortocoronary bypass graft: Secondary | ICD-10-CM | POA: Insufficient documentation

## 2012-10-18 DIAGNOSIS — R300 Dysuria: Secondary | ICD-10-CM

## 2012-10-18 DIAGNOSIS — G8929 Other chronic pain: Secondary | ICD-10-CM | POA: Insufficient documentation

## 2012-10-18 DIAGNOSIS — Z79899 Other long term (current) drug therapy: Secondary | ICD-10-CM | POA: Insufficient documentation

## 2012-10-18 DIAGNOSIS — Z87448 Personal history of other diseases of urinary system: Secondary | ICD-10-CM | POA: Insufficient documentation

## 2012-10-18 LAB — CBC WITH DIFFERENTIAL/PLATELET
Basophils Relative: 1 % (ref 0–1)
Eosinophils Relative: 4 % (ref 0–5)
HCT: 42.6 % (ref 39.0–52.0)
Hemoglobin: 14.5 g/dL (ref 13.0–17.0)
MCHC: 34 g/dL (ref 30.0–36.0)
MCV: 90.1 fL (ref 78.0–100.0)
Monocytes Absolute: 0.4 10*3/uL (ref 0.1–1.0)
Monocytes Relative: 12 % (ref 3–12)
Neutro Abs: 1.9 10*3/uL (ref 1.7–7.7)

## 2012-10-18 LAB — COMPREHENSIVE METABOLIC PANEL
Albumin: 3.8 g/dL (ref 3.5–5.2)
BUN: 24 mg/dL — ABNORMAL HIGH (ref 6–23)
CO2: 24 mEq/L (ref 19–32)
Calcium: 9.7 mg/dL (ref 8.4–10.5)
Chloride: 102 mEq/L (ref 96–112)
Creatinine, Ser: 1.38 mg/dL — ABNORMAL HIGH (ref 0.50–1.35)
GFR calc non Af Amer: 51 mL/min — ABNORMAL LOW (ref >90)
Total Bilirubin: 0.4 mg/dL (ref 0.3–1.2)

## 2012-10-18 LAB — PROTIME-INR
INR: 3.58 — ABNORMAL HIGH (ref 0.00–1.49)
Prothrombin Time: 34.4 seconds — ABNORMAL HIGH (ref 11.6–15.2)

## 2012-10-18 LAB — URINALYSIS, ROUTINE W REFLEX MICROSCOPIC
Leukocytes, UA: NEGATIVE
Nitrite: NEGATIVE
Protein, ur: NEGATIVE mg/dL
Specific Gravity, Urine: 1.023 (ref 1.005–1.030)
Urobilinogen, UA: 1 mg/dL (ref 0.0–1.0)

## 2012-10-18 NOTE — ED Notes (Signed)
Urinal at bedside. Pt aware of the need for a urine sample.

## 2012-10-18 NOTE — ED Provider Notes (Signed)
History    CSN: [id number] Arrival date & time 10/18/12  0915  First MD Initiated Contact with Patient 10/18/12 [phone]     Chief Complaint  Patient presents with  . Pyelonephritis   HPI  Patient presents with dysuria. Symptoms began 2 or 3 days ago, without clear precipitant. Since onset symptoms of persistent with no hematuria. There is mild associated lower abdominal pain, sore, nonradiating, suprapubic and left sided. There is no new fever, chills, chest pain, dyspnea, nausea, vomiting, diarrhea. Patient has history of chronic kidney disease, as well as multiple medical problems, including AAA for which he takes Coumadin.  The patient adds that in the days prior to development of symptoms he was drinking juice that he later found to be contaminated.  Past Medical History  Diagnosis Date  . CAD (coronary artery disease) CABG in 2004    Nuclear, October, 2010, no ischemia, EF 71%  . GERD (gastroesophageal reflux disease)   . Hypertension   . Hyperlipidemia   . Renal insufficiency   . Cellulitis and abscess of leg     right leg  . Tobacco abuse   . Chronic back pain   . Chronic neck pain   . Seborrheic dermatitis of scalp   . Abnormal LFTs     Hx of abnormal LFT's  . Bradycardia   . AAA (abdominal aortic aneurysm)     November 2010, Stenting, November, 2010  . Paroxysmal atrial fibrillation 02/22/2009    diltiazem po started but discontinued due to side effects.  . Ejection fraction     EF 40% in the past  /   EF 55-65% echo, November, 2006  . Hx of CABG     2004  . Carotid artery disease     Doppler, October, 2012, and old LICA occlusion , RICA no significant abnormality  . Arthritis   . Prediabetes   . CVA (cerebral infarction)     R Carona radiata stroke 08/16/2011  . Stroke 11/26/11   Past Surgical History  Procedure Laterality Date  . Coronary artery bypass graft  2004  . Rotator cuff repair    . Abdominal aortic aneurysm repair  2010    Aortic stent  repair   Family History  Problem Relation Age of Onset  . Diabetes Mother   . Stroke Father   . Cancer Sister     thyroid cancer  . Heart disease Brother     Coronary artery disease   History  Substance Use Topics  . Smoking status: Former Smoker    Types: Cigarettes    Quit date: 02/03/2010  . Smokeless tobacco: Never Used  . Alcohol Use: No    Review of Systems  Constitutional:       Per HPI, otherwise negative  HENT:       Per HPI, otherwise negative  Respiratory:       Per HPI, otherwise negative  Cardiovascular:       Per HPI, otherwise negative  Gastrointestinal: Negative for vomiting.  Endocrine:       Negative aside from HPI  Genitourinary:       Neg aside from HPI   Musculoskeletal:       Per HPI, otherwise negative  Skin: Negative.   Neurological: Negative for syncope.    Allergies  Diltiazem hcl and Oxycodone-acetaminophen  Home Medications   Current Outpatient Rx  Name  Route  Sig  Dispense  Refill  . aspirin EC 81 MG EC tablet  Oral   Take 1 tablet (81 mg total) by mouth daily.         . cetirizine (ZYRTEC) 10 MG tablet   Oral   Take 1 tablet (10 mg total) by mouth daily.   100 tablet   1   . clotrimazole (LOTRIMIN) 1 % cream   Topical   Apply topically 2 (two) times daily.   60 g   0   . HYDROcodone-acetaminophen (NORCO/VICODIN) 5-325 MG per tablet   Oral   Take 1 tablet by mouth every 6 (six) hours as needed for pain.   15 tablet   0   . hydrOXYzine (ATARAX/VISTARIL) 10 MG tablet   Oral   Take 1 tablet (10 mg total) by mouth 2 (two) times daily as needed for itching.   60 tablet   1   . lisinopril (PRINIVIL,ZESTRIL) 10 MG tablet   Oral   Take 1 tablet (10 mg total) by mouth at bedtime.   30 tablet   6   . Multiple Vitamins-Minerals (MULTIVITAMIN WITH MINERALS) tablet   Oral   Take 1 tablet by mouth at bedtime.          . permethrin (ACTICIN) 5 % cream      Apply cream from head to toe, sparing face; leave on  for 8-14 hours before washing off with water; may reapply in 1 week if live mites appear   60 g   0   . simvastatin (ZOCOR) 10 MG tablet   Oral   Take 1 tablet (10 mg total) by mouth at bedtime.   90 tablet   6   . warfarin (COUMADIN) 5 MG tablet   Oral   Take 1 tablet (5 mg total) by mouth as directed.   30 tablet   3    BP 141/75  Pulse 42  Temp(Src) 97.6 F (36.4 C) (Oral)  Resp 16  SpO2 94% Physical Exam  Nursing note and vitals reviewed. Constitutional: He is oriented to person, place, and time. He appears well-developed. No distress.  HENT:  Head: Normocephalic and atraumatic.  Eyes: Conjunctivae and EOM are normal.  Cardiovascular: Normal rate and regular rhythm.   Pulmonary/Chest: Effort normal. No stridor. No respiratory distress.  Abdominal: He exhibits no distension. There is no tenderness. There is no rebound and no guarding.  Musculoskeletal: He exhibits no edema.  Neurological: He is alert and oriented to person, place, and time.  Skin: Skin is warm and dry.  Psychiatric: He has a normal mood and affect.    ED Course  Procedures (including critical care time) Labs Reviewed  CBC WITH DIFFERENTIAL   No results found. No diagnosis found. Pulse ox 97% room air normal Cardiac 65 sinus rhythm and normal Patient was placed on monitor due to an episode of mild bradycardia.  Patient has a history of bradycardia. I reviewed the patient's electrocardiogram correct.  11:12 AM On repeat exam the patient is sitting upright, no new complaints.  Vital signs remain stable. MDM  This patient presents with concern of dysuria.  On exam he is awake and oriented, in no distress.  Patient does have history of bradycardia, and during his triage had an episode of low heart rate, but no chest pain, dyspnea, no lightheadedness, no syncope. Patient's description of ingestion of possibly contaminated juice prior to the onset of symptoms suggests an etiology.  His evaluation  here is largely reassuring, though urine culture was sent following unremarkable urinalysis. He has F/U and was  d/c to see his physician for further E/M.   Gerhard Munch, MD 10/18/12 1114

## 2012-10-18 NOTE — ED Notes (Signed)
MD at bedside. 

## 2012-10-18 NOTE — ED Notes (Signed)
Pt reports he went to [facility] 5 days ago and bought some expired juice for his arthritis. Pt reports started having dysuria 2 days ago, 7/10. Abdominal pain x3 days. Reports headache starting this morning.

## 2012-10-19 LAB — URINE CULTURE

## 2012-10-28 ENCOUNTER — Other Ambulatory Visit: Payer: Self-pay | Admitting: Ophthalmology

## 2012-11-07 ENCOUNTER — Ambulatory Visit (INDEPENDENT_AMBULATORY_CARE_PROVIDER_SITE_OTHER): Payer: Medicare Other

## 2012-11-07 DIAGNOSIS — I4891 Unspecified atrial fibrillation: Secondary | ICD-10-CM

## 2012-11-07 DIAGNOSIS — Z7901 Long term (current) use of anticoagulants: Secondary | ICD-10-CM

## 2012-11-07 DIAGNOSIS — I639 Cerebral infarction, unspecified: Secondary | ICD-10-CM

## 2012-11-07 DIAGNOSIS — I6350 Cerebral infarction due to unspecified occlusion or stenosis of unspecified cerebral artery: Secondary | ICD-10-CM

## 2012-11-07 DIAGNOSIS — I480 Paroxysmal atrial fibrillation: Secondary | ICD-10-CM

## 2012-11-07 LAB — POCT INR: INR: 3.8

## 2012-11-11 ENCOUNTER — Encounter: Payer: Self-pay | Admitting: Vascular Surgery

## 2012-11-12 ENCOUNTER — Ambulatory Visit (INDEPENDENT_AMBULATORY_CARE_PROVIDER_SITE_OTHER): Payer: Medicare Other | Admitting: Vascular Surgery

## 2012-11-12 ENCOUNTER — Encounter: Payer: Self-pay | Admitting: Vascular Surgery

## 2012-11-12 ENCOUNTER — Other Ambulatory Visit (INDEPENDENT_AMBULATORY_CARE_PROVIDER_SITE_OTHER): Payer: Medicare Other | Admitting: *Deleted

## 2012-11-12 DIAGNOSIS — Z8673 Personal history of transient ischemic attack (TIA), and cerebral infarction without residual deficits: Secondary | ICD-10-CM

## 2012-11-12 DIAGNOSIS — Z48812 Encounter for surgical aftercare following surgery on the circulatory system: Secondary | ICD-10-CM

## 2012-11-12 DIAGNOSIS — I714 Abdominal aortic aneurysm, without rupture: Secondary | ICD-10-CM

## 2012-11-12 DIAGNOSIS — I6529 Occlusion and stenosis of unspecified carotid artery: Secondary | ICD-10-CM

## 2012-11-12 NOTE — Progress Notes (Signed)
Subjective:     Patient ID: Dylan Morgan, male   DOB: May 14, 1945, 67 y.o.   MRN: [id number]  HPI this 67 year old male returns for continued followup regarding his aortic stent graft which placed in 2010 and his known left ICA occlusion. He denies any active neurologic symptoms such as lateralizing weakness, aphasia, amaurosis fugax, diplopia, blurred vision, or syncope. He does have chronic blindness in the left eye. He has also had no abdominal or back symptoms.  Past Medical History  Diagnosis Date  . CAD (coronary artery disease) CABG in 2004    Nuclear, October, 2010, no ischemia, EF 71%  . GERD (gastroesophageal reflux disease)   . Hypertension   . Hyperlipidemia   . Renal insufficiency   . Cellulitis and abscess of leg     right leg  . Tobacco abuse   . Chronic back pain   . Chronic neck pain   . Seborrheic dermatitis of scalp   . Abnormal LFTs     Hx of abnormal LFT's  . Bradycardia   . AAA (abdominal aortic aneurysm)     November 2010, Stenting, November, 2010  . Paroxysmal atrial fibrillation 02/22/2009    diltiazem po started but discontinued due to side effects.  . Ejection fraction     EF 40% in the past  /   EF 55-65% echo, November, 2006  . Hx of CABG     2004  . Carotid artery disease     Doppler, October, 2012, and old LICA occlusion , RICA no significant abnormality  . Arthritis   . Prediabetes   . CVA (cerebral infarction)     R Carona radiata stroke 08/16/2011  . Stroke 11/26/11    History  Substance Use Topics  . Smoking status: Former Smoker -- 0.50 packs/day for 35 years    Types: Cigarettes    Quit date: 02/03/2010  . Smokeless tobacco: Never Used  . Alcohol Use: No    Family History  Problem Relation Age of Onset  . Diabetes Mother   . Stroke Father   . Cancer Sister     thyroid cancer  . Heart disease Brother     Coronary artery disease    Allergies  Allergen Reactions  . Diltiazem Hcl Itching  . Oxycodone-Acetaminophen Other (See  Comments)    vertigo    Current outpatient prescriptions:aspirin EC 81 MG EC tablet, Take 1 tablet (81 mg total) by mouth daily., Disp: , Rfl: ;  cetirizine (ZYRTEC) 10 MG tablet, Take 1 tablet (10 mg total) by mouth daily., Disp: 100 tablet, Rfl: 1;  lisinopril (PRINIVIL,ZESTRIL) 10 MG tablet, Take 1 tablet (10 mg total) by mouth daily., Disp: 30 tablet, Rfl: 3 Multiple Vitamins-Minerals (MULTIVITAMIN WITH MINERALS) tablet, Take 1 tablet by mouth at bedtime. , Disp: , Rfl: ;  simvastatin (ZOCOR) 10 MG tablet, Take 1 tablet (10 mg total) by mouth at bedtime., Disp: 90 tablet, Rfl: 6;  warfarin (COUMADIN) 5 MG tablet, Take 2.5-5 mg by mouth every evening. 1 tab daily except 0.5 tab on Mondays and Fridays., Disp: , Rfl:   BP 116/84  Pulse 81  Resp 18  Ht 5\' 11"  (1.803 m)  Wt 243 lb (110.224 kg)  BMI 33.91 kg/m2  Body mass index is 33.91 kg/(m^2).           Review of Systems denies chest pain but does complain of pain in his legs with walking, pain in his feet while lying flat, numbness in his legs. Denies  productive cough, hemoptysis, chest pain. All other systems negative and complete review of systems    Objective:   Physical Exam BP 116/84  Pulse 81  Resp 18  Ht 5\' 11"  (1.803 m)  Wt 243 lb (110.224 kg)  BMI 33.91 kg/m2  Gen.-alert and oriented x3 in no apparent distress HEENT normal for age Lungs no rhonchi or wheezing Cardiovascular regular rhythm no murmurs carotid pulses 3+ palpable no bruits audible Abdomen soft nontender no palpable masses-obese Musculoskeletal free of  major deformities Skin clear -no rashes Neurologic normal Lower extremities 3+ femoral and posterior tibial pulses palpable bilaterally with no edema  Today I reviewed the CT angiogram of the abdomen and pelvis which was performed 09/20/2012 a computer. There is no evidence of endoleak. The aneurysm sac is 52 mm in maximum diameter and the stent graft is in excellent position.  I also ordered a  carotid duplex exam which I reviewed and interpreted. The right ICA has very mild stenosis less than 40% in severity in the left ICA is known to be occluded.       Assessment:     Mild right ICA stenosis with left ICA occlusion-asymptomatic Nicely functioning aortic stent graft in good position with no evidence of endoleak    Plan:     Return in one year with CT angiogram of abdomen and pelvis and carotid duplex exam right carotid artery

## 2012-11-13 ENCOUNTER — Other Ambulatory Visit: Payer: Self-pay | Admitting: *Deleted

## 2012-11-13 DIAGNOSIS — I714 Abdominal aortic aneurysm, without rupture: Secondary | ICD-10-CM

## 2012-11-13 DIAGNOSIS — Z48812 Encounter for surgical aftercare following surgery on the circulatory system: Secondary | ICD-10-CM

## 2012-11-13 NOTE — Addendum Note (Signed)
Addended by: Sharee Pimple on: 11/13/2012 08:22 AM   Modules accepted: Orders

## 2012-11-28 ENCOUNTER — Ambulatory Visit (INDEPENDENT_AMBULATORY_CARE_PROVIDER_SITE_OTHER): Payer: Medicare Other

## 2012-11-28 DIAGNOSIS — I4891 Unspecified atrial fibrillation: Secondary | ICD-10-CM

## 2012-11-28 DIAGNOSIS — I639 Cerebral infarction, unspecified: Secondary | ICD-10-CM

## 2012-11-28 DIAGNOSIS — I480 Paroxysmal atrial fibrillation: Secondary | ICD-10-CM

## 2012-11-28 DIAGNOSIS — Z7901 Long term (current) use of anticoagulants: Secondary | ICD-10-CM

## 2012-11-28 DIAGNOSIS — I6350 Cerebral infarction due to unspecified occlusion or stenosis of unspecified cerebral artery: Secondary | ICD-10-CM

## 2012-11-28 LAB — POCT INR: INR: 5.6

## 2012-12-09 ENCOUNTER — Ambulatory Visit (INDEPENDENT_AMBULATORY_CARE_PROVIDER_SITE_OTHER): Payer: Medicare Other | Admitting: *Deleted

## 2012-12-09 DIAGNOSIS — I639 Cerebral infarction, unspecified: Secondary | ICD-10-CM

## 2012-12-09 DIAGNOSIS — Z7901 Long term (current) use of anticoagulants: Secondary | ICD-10-CM

## 2012-12-09 DIAGNOSIS — I6350 Cerebral infarction due to unspecified occlusion or stenosis of unspecified cerebral artery: Secondary | ICD-10-CM

## 2012-12-09 DIAGNOSIS — I4891 Unspecified atrial fibrillation: Secondary | ICD-10-CM

## 2012-12-09 DIAGNOSIS — I480 Paroxysmal atrial fibrillation: Secondary | ICD-10-CM

## 2012-12-09 LAB — POCT INR: INR: 3

## 2012-12-23 ENCOUNTER — Ambulatory Visit (INDEPENDENT_AMBULATORY_CARE_PROVIDER_SITE_OTHER): Payer: Medicare Other | Admitting: *Deleted

## 2012-12-23 DIAGNOSIS — I639 Cerebral infarction, unspecified: Secondary | ICD-10-CM

## 2012-12-23 DIAGNOSIS — I6350 Cerebral infarction due to unspecified occlusion or stenosis of unspecified cerebral artery: Secondary | ICD-10-CM

## 2012-12-23 DIAGNOSIS — Z7901 Long term (current) use of anticoagulants: Secondary | ICD-10-CM

## 2012-12-23 DIAGNOSIS — I4891 Unspecified atrial fibrillation: Secondary | ICD-10-CM

## 2012-12-23 DIAGNOSIS — I480 Paroxysmal atrial fibrillation: Secondary | ICD-10-CM

## 2012-12-23 LAB — POCT INR: INR: 2.4

## 2013-01-10 ENCOUNTER — Encounter: Payer: Self-pay | Admitting: Internal Medicine

## 2013-01-10 ENCOUNTER — Ambulatory Visit (INDEPENDENT_AMBULATORY_CARE_PROVIDER_SITE_OTHER): Payer: Medicare Other | Admitting: Internal Medicine

## 2013-01-10 VITALS — BP 97/80 | HR 71 | Temp 97.1°F | Ht 71.0 in | Wt 248.6 lb

## 2013-01-10 DIAGNOSIS — B351 Tinea unguium: Secondary | ICD-10-CM

## 2013-01-10 LAB — HEPATIC FUNCTION PANEL
Albumin: 4.1 g/dL (ref 3.5–5.2)
Alkaline Phosphatase: 63 U/L (ref 39–117)
Bilirubin, Direct: 0.1 mg/dL (ref 0.0–0.3)
Total Bilirubin: 0.6 mg/dL (ref 0.3–1.2)
Total Protein: 7.2 g/dL (ref 6.0–8.3)

## 2013-01-10 MED ORDER — TERBINAFINE HCL 250 MG PO TABS: 250.0000 mg | ORAL_TABLET | Freq: Every day | ORAL | Status: DC

## 2013-01-10 MED ORDER — MICONAZOLE POWD: 1.0000 "application " | Freq: Two times a day (BID) | Status: DC

## 2013-01-10 NOTE — Patient Instructions (Signed)
General Instructions: Your toenail condition is called Onychomycosis, which is a fungal infection.  Even with treatment, this condition may not improve.  You also have some evidence of athlete's foot.  To treat this: 1. Use Terbinafine, 1 tablet, once/day, for 3 months 2. Use Miconazole powder, apply to feet twice per day for 4 weeks 3. We are referring you to Podiatry (foot doctor) for toenail care 4. We are checking your liver today, and will need to check it again in 6 weeks.  Return for a lab visit only in 6 weeks. 5. Discuss with your Coumadin Clinic on 10/13 whether you need to adjust the dose of your Coumadin due to these medications  Please return for a follow-up visit in 3 months.  Treatment Goals:  Goals (1 Years of Data) as of 01/10/13   None      Progress Toward Treatment Goals:  Treatment Goal 01/10/2013  Blood pressure at goal    Self Care Goals & Plans:  Self Care Goal 01/10/2013  Manage my medications take my medicines as prescribed; bring my medications to every visit; refill my medications on time  Monitor my health bring my glucose meter and log to each visit  Eat healthy foods eat foods that are low in salt  Be physically active take a walk every day    No flowsheet data found.   Care Management & Community Referrals:  Referral 01/10/2013  Referrals made for care management support none needed

## 2013-01-10 NOTE — Progress Notes (Signed)
HPI The patient is a 67 y.o. male with a history of COPD, CAD, OSA, prior CVA, prediabetes, presenting for an acute visit for foot pain.  The patient notes a 1-year history of bilateral toenail hypertrophy and discoloration.  The patient notes associated bilateral forefoot pain, described as a dull pain, with no aggravating or alleviating factors.  No itching, redness, edema, or drainage.  ROS: General: no fevers, chills, changes in weight, changes in appetite Skin: no rash HEENT: no blurry vision, hearing changes, sore throat Pulm: no dyspnea, coughing, wheezing CV: no chest pain, palpitations, shortness of breath Abd: no abdominal pain, nausea/vomiting, diarrhea/constipation GU: no dysuria, hematuria, polyuria Ext: no arthralgias, myalgias Neuro: no weakness, numbness, or tingling  Filed Vitals:   01/10/13 1427  BP: 97/80  Pulse: 71  Temp: 97.1 F (36.2 C)    PEX General: alert, cooperative, and in no apparent distress HEENT: pupils equal round and reactive to light, vision grossly intact, oropharynx clear and non-erythematous  Neck: supple Lungs: clear to ascultation bilaterally, normal work of respiration, no wheezes, rales, ronchi Heart: regular rate and rhythm, no murmurs, gallops, or rubs Abdomen: soft, non-tender, non-distended, normal bowel sounds Extremities: Bilateral feet with significant toenail hypertrophy and discoloration, involving all toenails. Whitish exudate present on and between toes, with mild moisture related skin breakdown between toes Neurologic: alert & oriented X3, cranial nerves II-XII intact, strength grossly intact, sensation intact to light touch  Current Outpatient Prescriptions on File Prior to Visit  Medication Sig Dispense Refill  . aspirin EC 81 MG EC tablet Take 1 tablet (81 mg total) by mouth daily.      . cetirizine (ZYRTEC) 10 MG tablet Take 1 tablet (10 mg total) by mouth daily.  100 tablet  1  . lisinopril (PRINIVIL,ZESTRIL) 10 MG  tablet Take 1 tablet (10 mg total) by mouth daily.  30 tablet  3  . Multiple Vitamins-Minerals (MULTIVITAMIN WITH MINERALS) tablet Take 1 tablet by mouth at bedtime.       . simvastatin (ZOCOR) 10 MG tablet Take 1 tablet (10 mg total) by mouth at bedtime.  90 tablet  6  . warfarin (COUMADIN) 5 MG tablet Take 2.5-5 mg by mouth every evening. 1 tab daily except 0.5 tab on Mondays and Fridays.       No current facility-administered medications on file prior to visit.    Assessment/Plan

## 2013-01-10 NOTE — Assessment & Plan Note (Signed)
The patient presents with evidence of bilateral toenail onychomycosis, as well as evidence of bilateral tinea pedis, with associated pain and discomfort, but without itching. We discussed that the treatment for onychomycosis is not often successful, and can have significant side effects such as liver dysfunction. However the patient feels strongly that he would like to try treatment for this. -Terbinafine 250 mg daily for 3 months -Check LFTs today, and at a lab visit in 6 weeks -Will use topical miconazole powder for tinea pedis. Some sources suggest that terbinafine treatment can also cover tinea pedis, but since there is not uniform acceptance of this, will treat with topical miconazole to ensure best chance of treatment success -Referral placed to podiatry for local toenail care -Patient has an appointment with Coumadin clinic in 3 days. I recommend continue his current dose of Coumadin for now, but following up with his Coumadin clinic for further instructions, in case this medication needs to be adjusted with the addition of terbinafine

## 2013-01-12 NOTE — Progress Notes (Signed)
Case discussed with Dr. Rasp at the time of the visit.  We reviewed the resident's history and exam and pertinent patient test results.  I agree with the assessment, diagnosis, and plan of care documented in the resident's note. 

## 2013-01-13 ENCOUNTER — Ambulatory Visit (INDEPENDENT_AMBULATORY_CARE_PROVIDER_SITE_OTHER): Payer: Medicare Other | Admitting: *Deleted

## 2013-01-13 DIAGNOSIS — I480 Paroxysmal atrial fibrillation: Secondary | ICD-10-CM

## 2013-01-13 DIAGNOSIS — Z7901 Long term (current) use of anticoagulants: Secondary | ICD-10-CM

## 2013-01-13 DIAGNOSIS — I639 Cerebral infarction, unspecified: Secondary | ICD-10-CM

## 2013-01-13 DIAGNOSIS — I6350 Cerebral infarction due to unspecified occlusion or stenosis of unspecified cerebral artery: Secondary | ICD-10-CM

## 2013-01-13 DIAGNOSIS — I4891 Unspecified atrial fibrillation: Secondary | ICD-10-CM

## 2013-01-13 LAB — POCT INR: INR: 2.3

## 2013-02-04 ENCOUNTER — Other Ambulatory Visit: Payer: Self-pay | Admitting: *Deleted

## 2013-02-04 MED ORDER — WARFARIN SODIUM 5 MG PO TABS: 5.0000 mg | ORAL_TABLET | ORAL | Status: DC

## 2013-02-10 ENCOUNTER — Ambulatory Visit (INDEPENDENT_AMBULATORY_CARE_PROVIDER_SITE_OTHER): Payer: Medicare Other | Admitting: General Practice

## 2013-02-10 DIAGNOSIS — I6350 Cerebral infarction due to unspecified occlusion or stenosis of unspecified cerebral artery: Secondary | ICD-10-CM

## 2013-02-10 DIAGNOSIS — I4891 Unspecified atrial fibrillation: Secondary | ICD-10-CM

## 2013-02-10 DIAGNOSIS — I639 Cerebral infarction, unspecified: Secondary | ICD-10-CM

## 2013-02-10 DIAGNOSIS — I480 Paroxysmal atrial fibrillation: Secondary | ICD-10-CM

## 2013-02-10 DIAGNOSIS — Z7901 Long term (current) use of anticoagulants: Secondary | ICD-10-CM

## 2013-02-10 LAB — POCT INR: INR: 2.4

## 2013-03-07 ENCOUNTER — Ambulatory Visit (INDEPENDENT_AMBULATORY_CARE_PROVIDER_SITE_OTHER): Payer: Medicare Other | Admitting: Internal Medicine

## 2013-03-07 ENCOUNTER — Encounter: Payer: Self-pay | Admitting: Internal Medicine

## 2013-03-07 VITALS — BP 137/82 | HR 97 | Temp 97.3°F | Wt 246.4 lb

## 2013-03-07 DIAGNOSIS — I10 Essential (primary) hypertension: Secondary | ICD-10-CM

## 2013-03-07 DIAGNOSIS — I480 Paroxysmal atrial fibrillation: Secondary | ICD-10-CM

## 2013-03-07 DIAGNOSIS — I4891 Unspecified atrial fibrillation: Secondary | ICD-10-CM

## 2013-03-07 DIAGNOSIS — B351 Tinea unguium: Secondary | ICD-10-CM

## 2013-03-07 LAB — COMPLETE METABOLIC PANEL WITH GFR
AST: 25 U/L (ref 0–37)
Alkaline Phosphatase: 72 U/L (ref 39–117)
GFR, Est Non African American: 38 mL/min — ABNORMAL LOW
Glucose, Bld: 87 mg/dL (ref 70–99)
Sodium: 139 mEq/L (ref 135–145)
Total Bilirubin: 0.4 mg/dL (ref 0.3–1.2)
Total Protein: 7.8 g/dL (ref 6.0–8.3)

## 2013-03-07 NOTE — Assessment & Plan Note (Addendum)
Assessment:  Pt with history of paroxysmal atrial fibrillation since 2010 and CVA in 2013 who is very mildly subtherapeutic (INR 2.4 on 02/10/13 ) on coumadin and currently asymptomatic without signs of thrombosis.      Plan: -Continue AC therapy with warfarin -Continue AP therapy with aspirin 81 mg for CAD s/p CABG in 2004. -Continue to monitor INR in setting of concomitant terbinafine use as there is interaction -Pt follows with coumadin clinic

## 2013-03-07 NOTE — Progress Notes (Signed)
Patient ID: Dylan Morgan, male   DOB: 1945-04-25, 67 y.o.   MRN: [id number]   Subjective:   Patient ID: Dylan Morgan male   DOB: November 18, 1945 67 y.o.   MRN: [id number]  HPI: [name] is a 67 y.o. very pleasant man with past medical history of HTN, HL, PAF on coumadin, CAD s/p CABG (2004), AAA s/p sten (2010), CKD Stage III, OSA, CVA, COPD, allergic rhinitis, and GERD who presents for follow-up visit of onychomycosis of toenails.   Patient was seen on 01/10/13 for fungal infection of his bilateral toenails which was ongoing for 1 year and associated with thickening, discoloration, and bilateral forefoot pain without itching or signs of infection. Patient was started on terbinafine 250 mg which he has been compliant with as well as miconazole powder for concomitant tinea pedis infection. He reports his nails have improved in discoloration (yellowing) and thickening with no pain, pruritis, or broken nails. He has been going to a foot center where he has his toenails trimmed regularly. He denies fever, chills, nausea, vomiting, abdominal pain, rash, joint pain, pruritis, or jaundice. He does not wear open or tight fitting shoes and changes his socks daily.   Patient with history of paroxysmal atrial fibrillation since 2010  who is compliant with coumadin therapy with last INR of 2.4 on 02/10/13. He denies palpitations, chest pain, dyspnea, lightheadedness, or syncope.  Last CVA was in 2013 without residual defects. He denies bleeding but does report easy bruising. He is also on daily aspirin for CAD s/p CABG in 2004.           Past Medical History  Diagnosis Date  . CAD (coronary artery disease) CABG in 2004    Nuclear, October, 2010, no ischemia, EF 71%  . GERD (gastroesophageal reflux disease)   . Hypertension   . Hyperlipidemia   . Renal insufficiency   . Cellulitis and abscess of leg     right leg  . Tobacco abuse   . Chronic back pain   . Chronic neck pain   . Seborrheic  dermatitis of scalp   . Abnormal LFTs     Hx of abnormal LFT's  . Bradycardia   . AAA (abdominal aortic aneurysm)     November 2010, Stenting, November, 2010  . Paroxysmal atrial fibrillation 02/22/2009    diltiazem po started but discontinued due to side effects.  . Ejection fraction     EF 40% in the past  /   EF 55-65% echo, November, 2006  . Hx of CABG     2004  . Carotid artery disease     Doppler, October, 2012, and old LICA occlusion , RICA no significant abnormality  . Arthritis   . Prediabetes   . CVA (cerebral infarction)     R Carona radiata stroke 08/16/2011  . Stroke 11/26/11   Current Outpatient Prescriptions  Medication Sig Dispense Refill  . aspirin EC 81 MG EC tablet Take 1 tablet (81 mg total) by mouth daily.      . cetirizine (ZYRTEC) 10 MG tablet Take 1 tablet (10 mg total) by mouth daily.  100 tablet  1  . lisinopril (PRINIVIL,ZESTRIL) 10 MG tablet Take 1 tablet (10 mg total) by mouth daily.  30 tablet  3  . Miconazole POWD Apply 1 application topically 2 (two) times daily. Apply to both feet and toes for 4 weeks  100 g  0  . Multiple Vitamins-Minerals (MULTIVITAMIN WITH MINERALS) tablet Take 1 tablet by  mouth at bedtime.       . simvastatin (ZOCOR) 10 MG tablet Take 1 tablet (10 mg total) by mouth at bedtime.  90 tablet  6  . terbinafine (LAMISIL) 250 MG tablet Take 1 tablet (250 mg total) by mouth daily. Take for 3 months  30 tablet  2  . warfarin (COUMADIN) 5 MG tablet Take 1 tablet (5 mg total) by mouth as directed.  30 tablet  1   No current facility-administered medications for this visit.   Family History  Problem Relation Age of Onset  . Diabetes Mother   . Stroke Father   . Cancer Sister     thyroid cancer  . Heart disease Brother     Coronary artery disease   History   Social History  . Marital Status: Single    Spouse Name: N/A    Number of Children: N/A  . Years of Education: N/A   Occupational History  . disabled    Social History  Main Topics  . Smoking status: Former Smoker -- 0.50 packs/day for 35 years    Types: Cigarettes    Quit date: 02/03/2010  . Smokeless tobacco: Never Used  . Alcohol Use: No  . Drug Use: No  . Sexual Activity: None   Other Topics Concern  . None   Social History Narrative   Lives with son and his significant other, denies being married. Has 3 daughters & a son.  Several grandchildren.    Divorced.   Regular exercise.   Prior to retirement, hung dry wall for a living.    Review of Systems: Review of Systems  Constitutional: Negative for fever, chills, weight loss, malaise/fatigue and diaphoresis.  HENT: Negative for congestion and sore throat.   Eyes: Negative for blurred vision.       Left eye with peripheral vision intact  Respiratory: Negative for cough, shortness of breath and wheezing.   Cardiovascular: Negative for chest pain, palpitations and leg swelling.  Gastrointestinal: Negative for heartburn, nausea, vomiting, abdominal pain, diarrhea, constipation and blood in stool.  Genitourinary: Negative for dysuria, urgency, frequency and hematuria.  Musculoskeletal: Negative for back pain, joint pain and myalgias.  Skin: Negative for itching and rash.  Neurological: Positive for sensory change (chornic bilateral numbness of feet). Negative for dizziness, speech change, loss of consciousness, weakness and headaches.  Endo/Heme/Allergies: Positive for environmental allergies (well controlled). Bruises/bleeds easily (on coumadin).    Objective:  Physical Exam: Filed Vitals:   03/07/13 1451  BP: 137/82  Pulse: 97  Temp: 97.3 F (36.3 C)  TempSrc: Oral  Weight: 246 lb 6.4 oz (111.766 kg)  SpO2: 97%    Physical Exam  Constitutional: He is oriented to person, place, and time. He appears well-developed and well-nourished. No distress.  obese  HENT:  Head: Normocephalic and atraumatic.  Right Ear: External ear normal.  Left Ear: External ear normal.  Nose: Nose normal.    Mouth/Throat: Oropharynx is clear and moist. No oropharyngeal exudate.  Eyes: Conjunctivae and EOM are normal. Pupils are equal, round, and reactive to light. Right eye exhibits no discharge. Left eye exhibits no discharge. No scleral icterus.  Left eye only with intact peripheral vision   Neck: Normal range of motion. Neck supple.  Cardiovascular: Normal rate, regular rhythm and normal heart sounds.   Pulmonary/Chest: Effort normal and breath sounds normal. No respiratory distress. He has no wheezes. He has no rales.  Abdominal: Soft. Bowel sounds are normal. He exhibits no distension. There is  no tenderness. There is no rebound and no guarding.  Musculoskeletal: Normal range of motion. He exhibits no edema and no tenderness.  Neurological: He is alert and oriented to person, place, and time. No cranial nerve deficit.  Sensation intact to light touch of b/l LE including feet  Skin: Skin is warm and dry. No rash noted. He is not diaphoretic. No erythema. No pallor.  Bilateral feet with thickened and yellow toenails of all digits without overlying erythema, warmth, or tenderness. Left great toenail with black tinge. Feet cool to touch with intact pulses.  Well healed scar on left upper back   Psychiatric: He has a normal mood and affect. His behavior is normal. Judgment and thought content normal.    Assessment & Plan:  Please see problem list for problem based assessment and plan

## 2013-03-07 NOTE — Assessment & Plan Note (Addendum)
Assessment: Pt with chronic onychomycosis of bilateral toenails who is improving with oral anti-fungal therapy and frequent nail trimmings without symptoms of adverse effect of oral terbinafine therapy or signs of worsening fungal infection.     Plan: -Obtain CMP to assess liver function in setting of terbinafine therapy  -Continue PO terbinafine for completion of 12 week course (to end mid-January)  -Completed 1 month of topical miconazole therapy for tinea pedis  -Continue podiatry toenail care  -Pt to return in 6 weeks for lab visit (CMP)

## 2013-03-07 NOTE — Patient Instructions (Addendum)
-  Continue taking lamisil daily  -Will check your blood work today -Will have you come back in 6 weeks for a lab visit to check your blood work -Pleasure meeting you!

## 2013-03-07 NOTE — Assessment & Plan Note (Addendum)
Assessment: Pt with controlled hypertension who is compliant with anti-hypertensive therapy who presents with BP of 137/82.    Plan: -BP at goal <140/90  -Continue lisinopril 10 mg daily  -Obtain CMP

## 2013-03-10 ENCOUNTER — Ambulatory Visit (INDEPENDENT_AMBULATORY_CARE_PROVIDER_SITE_OTHER): Payer: Medicare Other | Admitting: *Deleted

## 2013-03-10 DIAGNOSIS — I639 Cerebral infarction, unspecified: Secondary | ICD-10-CM

## 2013-03-10 DIAGNOSIS — I4891 Unspecified atrial fibrillation: Secondary | ICD-10-CM

## 2013-03-10 DIAGNOSIS — Z7901 Long term (current) use of anticoagulants: Secondary | ICD-10-CM

## 2013-03-10 DIAGNOSIS — I6350 Cerebral infarction due to unspecified occlusion or stenosis of unspecified cerebral artery: Secondary | ICD-10-CM

## 2013-03-10 DIAGNOSIS — I480 Paroxysmal atrial fibrillation: Secondary | ICD-10-CM

## 2013-03-11 NOTE — Addendum Note (Signed)
Addended by: Neomia Dear on: 03/11/2013 09:31 PM   Modules accepted: Orders

## 2013-03-17 ENCOUNTER — Ambulatory Visit (INDEPENDENT_AMBULATORY_CARE_PROVIDER_SITE_OTHER): Payer: Medicare Other | Admitting: *Deleted

## 2013-03-17 DIAGNOSIS — I639 Cerebral infarction, unspecified: Secondary | ICD-10-CM

## 2013-03-17 DIAGNOSIS — I6350 Cerebral infarction due to unspecified occlusion or stenosis of unspecified cerebral artery: Secondary | ICD-10-CM

## 2013-03-17 DIAGNOSIS — I480 Paroxysmal atrial fibrillation: Secondary | ICD-10-CM

## 2013-03-17 DIAGNOSIS — I4891 Unspecified atrial fibrillation: Secondary | ICD-10-CM

## 2013-03-17 DIAGNOSIS — Z7901 Long term (current) use of anticoagulants: Secondary | ICD-10-CM

## 2013-03-22 NOTE — Progress Notes (Signed)
I saw and evaluated the patient.  I personally confirmed the key portions of the history and exam documented by Dr. Rabbani and I reviewed pertinent patient test results.  The assessment, diagnosis, and plan were formulated together and I agree with the documentation in the resident's note.  

## 2013-03-25 ENCOUNTER — Ambulatory Visit (INDEPENDENT_AMBULATORY_CARE_PROVIDER_SITE_OTHER): Payer: Medicare Other | Admitting: Pharmacist

## 2013-03-25 DIAGNOSIS — I6350 Cerebral infarction due to unspecified occlusion or stenosis of unspecified cerebral artery: Secondary | ICD-10-CM

## 2013-03-25 DIAGNOSIS — I4891 Unspecified atrial fibrillation: Secondary | ICD-10-CM

## 2013-03-25 DIAGNOSIS — I639 Cerebral infarction, unspecified: Secondary | ICD-10-CM

## 2013-03-25 DIAGNOSIS — Z7901 Long term (current) use of anticoagulants: Secondary | ICD-10-CM

## 2013-03-25 DIAGNOSIS — I480 Paroxysmal atrial fibrillation: Secondary | ICD-10-CM

## 2013-03-25 LAB — POCT INR: INR: 1.3

## 2013-04-01 ENCOUNTER — Other Ambulatory Visit: Payer: Self-pay | Admitting: Internal Medicine

## 2013-04-04 NOTE — Telephone Encounter (Signed)
Hi Dylan Morgan,  It was originally prescribed for 3 month course, so if still not improved will need to come in for re-check and labs before prescription refilled.   Thanks,  Dr. Naaman Plummer

## 2013-04-07 ENCOUNTER — Ambulatory Visit (INDEPENDENT_AMBULATORY_CARE_PROVIDER_SITE_OTHER): Payer: Medicare Other

## 2013-04-07 DIAGNOSIS — I4891 Unspecified atrial fibrillation: Secondary | ICD-10-CM

## 2013-04-07 DIAGNOSIS — I480 Paroxysmal atrial fibrillation: Secondary | ICD-10-CM

## 2013-04-07 DIAGNOSIS — I639 Cerebral infarction, unspecified: Secondary | ICD-10-CM

## 2013-04-07 DIAGNOSIS — I6350 Cerebral infarction due to unspecified occlusion or stenosis of unspecified cerebral artery: Secondary | ICD-10-CM

## 2013-04-07 DIAGNOSIS — Z7901 Long term (current) use of anticoagulants: Secondary | ICD-10-CM

## 2013-04-07 LAB — POCT INR: INR: 1.6

## 2013-04-08 NOTE — Telephone Encounter (Signed)
i have made him an appt for Monday at 1415, could you please refuse the med request for the time being and let the md that sees him decide on course of therapy. Thanks hla

## 2013-04-14 ENCOUNTER — Encounter: Payer: Self-pay | Admitting: Internal Medicine

## 2013-04-14 ENCOUNTER — Ambulatory Visit (INDEPENDENT_AMBULATORY_CARE_PROVIDER_SITE_OTHER): Payer: Medicare Other | Admitting: Internal Medicine

## 2013-04-14 VITALS — BP 109/79 | HR 70 | Temp 97.2°F | Ht 68.0 in | Wt 250.6 lb

## 2013-04-14 DIAGNOSIS — R0981 Nasal congestion: Secondary | ICD-10-CM | POA: Insufficient documentation

## 2013-04-14 DIAGNOSIS — B351 Tinea unguium: Secondary | ICD-10-CM

## 2013-04-14 DIAGNOSIS — J3489 Other specified disorders of nose and nasal sinuses: Secondary | ICD-10-CM

## 2013-04-14 MED ORDER — FLUTICASONE PROPIONATE 50 MCG/ACT NA SUSP: 1.0000 | Freq: Every day | NASAL | Status: DC

## 2013-04-14 MED ORDER — TERBINAFINE HCL 250 MG PO TABS: 250.0000 mg | ORAL_TABLET | Freq: Every day | ORAL | Status: DC

## 2013-04-14 MED ORDER — SALINE SPRAY 0.65 % NA SOLN: 2.0000 | NASAL | Status: DC | PRN

## 2013-04-14 NOTE — Assessment & Plan Note (Signed)
Pt last CMP was normal and results reviewed with patient. Since starting lamisil pt has seen and happy with results with already 50-60% clearing of toe nails bilaterally. Pt also having  -refill lamisil -pt refused repeat CMP

## 2013-04-14 NOTE — Assessment & Plan Note (Signed)
This is an acute issue and began with sick contacts. Pt has no other systemic signs or symptoms of acute URI. Clear PND.  -flonase  -nasal saline spray

## 2013-04-14 NOTE — Progress Notes (Signed)
Subjective:    Patient ID: Dylan Morgan, male    DOB: 22-Jan-1946, 68 y.o.   MRN: [id number]  HPI Dylan Morgan is a 68 yo man pmh as listed below presents for lamisil refill.   Pt has had ongoing onchyomycosis but seen very good and positive results from lamisil. Having toe nails "sanded down" at podiatrist and putting clear nail polish on after procedure.   His other compliant is some sinus congestion that started w/in 24 hrs. He states he has had several sick contacts with URI symptoms. No fever/chills but some tinnitus in his right ear. A sore throat has also started within the last 24 hrs. He states that drinking hot beverages and honey has helped improve his symptoms greatly.   Review of Systems  Constitutional: Negative for fever, chills, activity change and fatigue.  HENT: Positive for congestion, postnasal drip, sinus pressure, sore throat and tinnitus. Negative for hearing loss and rhinorrhea.   Eyes: Negative for visual disturbance.  Respiratory: Negative for cough, chest tightness and shortness of breath.   Cardiovascular: Negative for chest pain, palpitations and leg swelling.  Gastrointestinal: Negative for nausea, vomiting, abdominal pain and constipation.  Skin: Positive for color change (of all 10 toenails but great improvement with 50% clearing per patient). Negative for rash.  Neurological: Negative for headaches.    Past Medical History  Diagnosis Date  . CAD (coronary artery disease) CABG in 2004    Nuclear, October, 2010, no ischemia, EF 71%  . GERD (gastroesophageal reflux disease)   . Hypertension   . Hyperlipidemia   . Renal insufficiency   . Cellulitis and abscess of leg     right leg  . Tobacco abuse   . Chronic back pain   . Chronic neck pain   . Seborrheic dermatitis of scalp   . Abnormal LFTs     Hx of abnormal LFT's  . Bradycardia   . AAA (abdominal aortic aneurysm)     November 2010, Stenting, November, 2010  . Paroxysmal atrial fibrillation  02/22/2009    diltiazem po started but discontinued due to side effects.  . Ejection fraction     EF 40% in the past  /   EF 55-65% echo, November, 2006  . Hx of CABG     2004  . Carotid artery disease     Doppler, October, 3329, and old LICA occlusion , RICA no significant abnormality  . Arthritis   . Prediabetes   . CVA (cerebral infarction)     R Carona radiata stroke 08/16/2011  . Stroke 11/26/11   Social, surgical, family history reviewed with patient and updated in appropriate chart locations.      Objective:   Physical Exam Filed Vitals:   04/14/13 1357  BP: 109/79  Pulse: 70  Temp: 97.2 F (36.2 C)   General: sitting in chair, NAD HEENT: PERRL, EOMI, no scleral icterus, impacted cerumen of left ear unable to see TM, right TM pearly grey with some clear fluid behind no pus or erythema, non retracted TM on right, oropharynx moist without erythema.  Cardiac: RRR, no rubs, murmurs or gallops Pulm: clear to auscultation bilaterally, moving normal volumes of air Abd: soft, nontender, nondistended, BS present Ext: warm and well perfused, no pedal edema, yellow discoloration of big toe with new clear nail growth behind yellow demarcation,  Neuro: alert and oriented X3, cranial nerves II-XII grossly intact     Assessment & Plan:  Please see problem oriented charting  Pt  discussed with Dr. Lynnae January

## 2013-04-14 NOTE — Patient Instructions (Signed)
Fluticasone nasal solution What is this medicine? FLUTICASONE (floo TIK a sone) is a corticosteroid. It helps decrease inflammation in your nose. This medicine is used to treat the symptoms of allergies like sneezing, itching, and runny or stuffy nose. This medicine may be used for other purposes; ask your health care provider or pharmacist if you have questions. COMMON BRAND NAME(S): Flonase What should I tell my health care provider before I take this medicine? They need to know if you have any of these conditions: -infection, like tuberculosis, herpes, or fungal infection -recent surgery on nose or sinuses -taking corticosteroid by mouth -an unusual or allergic reaction to fluticasone, steroids, other medicines, foods, dyes, or preservatives -pregnant or trying to get pregnant -breast-feeding How should I use this medicine? This medicine is for use in the nose. Follow the directions on your prescription label. This medicine works best if used regularly. Do not use more often than directed. Make sure that you are using your nasal spray correctly. Ask you doctor or health care provider if you have any questions. Talk to your pediatrician regarding the use of this medicine in children. While this drug may be prescribed for children as young as 23 years old for selected conditions, precautions do apply. Overdosage: If you think you have taken too much of this medicine contact a poison control center or emergency room at once. NOTE: This medicine is only for you. Do not share this medicine with others. What if I miss a dose? If you miss a dose, use it as soon as you remember. If it is almost time for your next dose, use only that dose and continue with your regular schedule. Do not use double or extra doses. What may interact with this medicine? -ketoconazole -metyrapone -some medicines for HIV -vaccines This list may not describe all possible interactions. Give your health care provider a list  of all the medicines, herbs, non-prescription drugs, or dietary supplements you use. Also tell them if you smoke, drink alcohol, or use illegal drugs. Some items may interact with your medicine. What should I watch for while using this medicine? Visit your doctor or health care professional for regular checks on your progress. Some symptoms may improve within 12 hours after starting use. Check with your doctor or health care professional if there is no improvement in your condition after 3 weeks of use. Do not come in contact with people who have chickenpox or the measles while you are taking this medicine. If you do, call your doctor right away. What side effects may I notice from receiving this medicine? Side effects that you should report to your doctor or health care professional as soon as possible: -allergic reactions like skin rash, itching or hives, swelling of the face, lips, or tongue -changes in vision -flu-like symptoms -white patches or sores in the mouth or nose Side effects that usually do not require medical attention (report to your doctor or health care professional if they continue or are bothersome): -burning or irritation inside the nose or throat -cough -headache -nosebleed -unusual taste or smell This list may not describe all possible side effects. Call your doctor for medical advice about side effects. You may report side effects to FDA at 1-800-FDA-1088. Where should I keep my medicine? Keep out of the reach of children. Store at room temperature between 15 and 30 degrees C (59 and 86 degrees F). Throw away any unused medicine after the expiration date. NOTE: This sheet is a summary. It may  not cover all possible information. If you have questions about this medicine, talk to your doctor, pharmacist, or health care provider.  2014, Elsevier/Gold Standard. (2008-03-03 10:40:16)

## 2013-04-17 ENCOUNTER — Other Ambulatory Visit: Payer: Self-pay | Admitting: Cardiology

## 2013-04-18 ENCOUNTER — Other Ambulatory Visit: Payer: Medicare Other

## 2013-04-18 NOTE — Progress Notes (Signed)
Case discussed with Dr. Sadek soon after the resident saw the patient.  We reviewed the resident's history and exam and pertinent patient test results.  I agree with the assessment, diagnosis, and plan of care documented in the resident's note. 

## 2013-04-21 ENCOUNTER — Ambulatory Visit (INDEPENDENT_AMBULATORY_CARE_PROVIDER_SITE_OTHER): Payer: Medicare Other | Admitting: Pharmacist

## 2013-04-21 DIAGNOSIS — I480 Paroxysmal atrial fibrillation: Secondary | ICD-10-CM

## 2013-04-21 DIAGNOSIS — Z7901 Long term (current) use of anticoagulants: Secondary | ICD-10-CM

## 2013-04-21 DIAGNOSIS — I6350 Cerebral infarction due to unspecified occlusion or stenosis of unspecified cerebral artery: Secondary | ICD-10-CM

## 2013-04-21 DIAGNOSIS — I4891 Unspecified atrial fibrillation: Secondary | ICD-10-CM

## 2013-04-21 DIAGNOSIS — I639 Cerebral infarction, unspecified: Secondary | ICD-10-CM

## 2013-04-21 LAB — POCT INR: INR: 2.3

## 2013-04-23 NOTE — Addendum Note (Signed)
Addended by: Truddie Crumble on: 04/23/2013 04:25 PM   Modules accepted: Orders

## 2013-05-12 ENCOUNTER — Ambulatory Visit (INDEPENDENT_AMBULATORY_CARE_PROVIDER_SITE_OTHER): Payer: Medicare Other | Admitting: Pharmacist

## 2013-05-12 DIAGNOSIS — I6350 Cerebral infarction due to unspecified occlusion or stenosis of unspecified cerebral artery: Secondary | ICD-10-CM

## 2013-05-12 DIAGNOSIS — Z5181 Encounter for therapeutic drug level monitoring: Secondary | ICD-10-CM

## 2013-05-12 DIAGNOSIS — I639 Cerebral infarction, unspecified: Secondary | ICD-10-CM

## 2013-05-12 DIAGNOSIS — I4891 Unspecified atrial fibrillation: Secondary | ICD-10-CM

## 2013-05-12 DIAGNOSIS — Z7901 Long term (current) use of anticoagulants: Secondary | ICD-10-CM

## 2013-05-12 DIAGNOSIS — I480 Paroxysmal atrial fibrillation: Secondary | ICD-10-CM

## 2013-05-12 LAB — POCT INR: INR: 1.8

## 2013-05-21 ENCOUNTER — Other Ambulatory Visit: Payer: Self-pay | Admitting: Vascular Surgery

## 2013-05-21 DIAGNOSIS — I6529 Occlusion and stenosis of unspecified carotid artery: Secondary | ICD-10-CM

## 2013-06-03 ENCOUNTER — Ambulatory Visit (INDEPENDENT_AMBULATORY_CARE_PROVIDER_SITE_OTHER): Payer: Medicare Other | Admitting: *Deleted

## 2013-06-03 DIAGNOSIS — I6350 Cerebral infarction due to unspecified occlusion or stenosis of unspecified cerebral artery: Secondary | ICD-10-CM

## 2013-06-03 DIAGNOSIS — I480 Paroxysmal atrial fibrillation: Secondary | ICD-10-CM

## 2013-06-03 DIAGNOSIS — Z7901 Long term (current) use of anticoagulants: Secondary | ICD-10-CM

## 2013-06-03 DIAGNOSIS — Z5181 Encounter for therapeutic drug level monitoring: Secondary | ICD-10-CM

## 2013-06-03 DIAGNOSIS — I4891 Unspecified atrial fibrillation: Secondary | ICD-10-CM

## 2013-06-03 DIAGNOSIS — I639 Cerebral infarction, unspecified: Secondary | ICD-10-CM

## 2013-06-03 LAB — POCT INR: INR: 1.7

## 2013-06-17 ENCOUNTER — Ambulatory Visit (INDEPENDENT_AMBULATORY_CARE_PROVIDER_SITE_OTHER): Payer: Medicare Other | Admitting: Pharmacist

## 2013-06-17 DIAGNOSIS — I6350 Cerebral infarction due to unspecified occlusion or stenosis of unspecified cerebral artery: Secondary | ICD-10-CM

## 2013-06-17 DIAGNOSIS — Z5181 Encounter for therapeutic drug level monitoring: Secondary | ICD-10-CM

## 2013-06-17 DIAGNOSIS — I639 Cerebral infarction, unspecified: Secondary | ICD-10-CM

## 2013-06-17 DIAGNOSIS — I480 Paroxysmal atrial fibrillation: Secondary | ICD-10-CM

## 2013-06-17 DIAGNOSIS — I4891 Unspecified atrial fibrillation: Secondary | ICD-10-CM

## 2013-06-17 DIAGNOSIS — Z7901 Long term (current) use of anticoagulants: Secondary | ICD-10-CM

## 2013-06-17 LAB — POCT INR: INR: 2.5

## 2013-07-01 ENCOUNTER — Ambulatory Visit (INDEPENDENT_AMBULATORY_CARE_PROVIDER_SITE_OTHER): Payer: Medicare Other

## 2013-07-01 DIAGNOSIS — I639 Cerebral infarction, unspecified: Secondary | ICD-10-CM

## 2013-07-01 DIAGNOSIS — I4891 Unspecified atrial fibrillation: Secondary | ICD-10-CM

## 2013-07-01 DIAGNOSIS — Z5181 Encounter for therapeutic drug level monitoring: Secondary | ICD-10-CM

## 2013-07-01 DIAGNOSIS — I6350 Cerebral infarction due to unspecified occlusion or stenosis of unspecified cerebral artery: Secondary | ICD-10-CM

## 2013-07-01 DIAGNOSIS — Z7901 Long term (current) use of anticoagulants: Secondary | ICD-10-CM

## 2013-07-01 DIAGNOSIS — I480 Paroxysmal atrial fibrillation: Secondary | ICD-10-CM

## 2013-07-01 LAB — POCT INR: INR: 2.2

## 2013-07-11 ENCOUNTER — Encounter: Payer: Self-pay | Admitting: Internal Medicine

## 2013-07-11 ENCOUNTER — Ambulatory Visit (INDEPENDENT_AMBULATORY_CARE_PROVIDER_SITE_OTHER): Payer: Commercial Managed Care - HMO | Admitting: Internal Medicine

## 2013-07-11 VITALS — BP 109/79 | HR 70 | Temp 97.4°F | Ht 68.0 in | Wt 251.1 lb

## 2013-07-11 DIAGNOSIS — J069 Acute upper respiratory infection, unspecified: Secondary | ICD-10-CM | POA: Insufficient documentation

## 2013-07-11 MED ORDER — PHENOL 1.4 % MT LIQD: 1.0000 | OROMUCOSAL | Status: DC | PRN

## 2013-07-11 NOTE — Patient Instructions (Signed)
I believe that your symptoms are due to a viral infection. I do not believe that antibiotics are likely to help. If your symptoms persist or worsen, please call our office and we will phone in an antibiotic for you.  Please use the throat spray as needed.

## 2013-07-11 NOTE — Assessment & Plan Note (Addendum)
The patients symptoms are likely due to viral URI. I plan to treat conservatively with tylenol and chloraseptic spray for symptom relief. It is possible but unlikely that patient has an early strep pharyngitis infection. CENTOR criteria is 0 (cough+, >45, no cerv lymph nodes, no fever).  Instructed patient that it will take at least 2-3 more days for symptom resolution. I instructed patient to call if symptoms worsen. I recommend empiric antibiotics if sings of bacterial pharyngitis develop.

## 2013-07-11 NOTE — Progress Notes (Signed)
Patient ID: Dylan Morgan, male   DOB: 08-06-1945, 68 y.o.   MRN: [id number]    Subjective:   Patient ID: Dylan Morgan male   DOB: [birth year] 68 y.o.   MRN: [id number]  HPI: [name] is a 68 y.o. man with a pmhx detailed below who presents with a 2 day history of sore throat. He states that he was in his normal state of health until 4 days ago when he started feeling malaise and increased fatigue. He reports recent exposure to sick contact with a sore throat. He admits to chills and subjective fever. He has a mild associated cough. He also notes rhinorrhea and head congestion. Denies CP and SOB. Denies LE edema. Denies changes in bowel and bladder. Denies abdominal pain. PO intake is normal.    Past Medical History  Diagnosis Date  . CAD (coronary artery disease) CABG in 2004    Nuclear, October, 2010, no ischemia, EF 71%  . GERD (gastroesophageal reflux disease)   . Hypertension   . Hyperlipidemia   . Renal insufficiency   . Cellulitis and abscess of leg     right leg  . Tobacco abuse   . Chronic back pain   . Chronic neck pain   . Seborrheic dermatitis of scalp   . Abnormal LFTs     Hx of abnormal LFT's  . Bradycardia   . AAA (abdominal aortic aneurysm)     November 2010, Stenting, November, 2010  . Paroxysmal atrial fibrillation 02/22/2009    diltiazem po started but discontinued due to side effects.  . Ejection fraction     EF 40% in the past  /   EF 55-65% echo, November, 2006  . Hx of CABG     2004  . Carotid artery disease     Doppler, October, 5009, and old LICA occlusion , RICA no significant abnormality  . Arthritis   . Prediabetes   . CVA (cerebral infarction)     R Carona radiata stroke 08/16/2011  . Stroke 11/26/11   Current Outpatient Prescriptions  Medication Sig Dispense Refill  . aspirin EC 81 MG EC tablet Take 1 tablet (81 mg total) by mouth daily.      . cetirizine (ZYRTEC) 10 MG tablet Take 1 tablet (10 mg total) by mouth daily.  100 tablet  1    . fluticasone (FLONASE) 50 MCG/ACT nasal spray Place 1 spray into both nostrils daily.  16 g  2  . lisinopril (PRINIVIL,ZESTRIL) 10 MG tablet Take 1 tablet (10 mg total) by mouth daily.  30 tablet  3  . Miconazole POWD Apply 1 application topically 2 (two) times daily. Apply to both feet and toes for 4 weeks  100 g  0  . Multiple Vitamins-Minerals (MULTIVITAMIN WITH MINERALS) tablet Take 1 tablet by mouth at bedtime.       . simvastatin (ZOCOR) 10 MG tablet Take 1 tablet (10 mg total) by mouth at bedtime.  90 tablet  6  . sodium chloride (OCEAN) 0.65 % SOLN nasal spray Place 2 sprays into both nostrils as needed for congestion.  45 mL  0  . terbinafine (LAMISIL) 250 MG tablet Take 1 tablet (250 mg total) by mouth daily. Take for 3 months  30 tablet  2  . warfarin (COUMADIN) 5 MG tablet TAKE AS DIRECTED BY COUMADIN CLINIC  30 tablet  2   No current facility-administered medications for this visit.   Family History  Problem Relation Age of Onset  .  Diabetes Mother   . Stroke Father   . Cancer Sister     thyroid cancer  . Heart disease Brother     Coronary artery disease   History   Social History  . Marital Status: Single    Spouse Name: N/A    Number of Children: N/A  . Years of Education: N/A   Occupational History  . disabled    Social History Main Topics  . Smoking status: Former Smoker -- 0.50 packs/day for 35 years    Types: Cigarettes    Quit date: 02/03/2010  . Smokeless tobacco: Never Used  . Alcohol Use: No  . Drug Use: No  . Sexual Activity: None   Other Topics Concern  . None   Social History Narrative   Lives with son and his significant other, denies being married. Has 3 daughters & a son.  Several grandchildren.    Divorced.   Regular exercise.   Prior to retirement, hung dry wall for a living.    Review of Systems: Pertinent items are noted in HPI. Objective:  Physical Exam: Filed Vitals:   07/11/13 1019  BP: 109/79  Pulse: 70  Temp: 97.4 F  (36.3 C)  TempSrc: Oral  Height: 5\' 8"  (1.727 m)  Weight: 251 lb 1.6 oz (113.898 kg)  SpO2: 99%   Physical Exam  Constitutional: He is oriented to person, place, and time. He appears well-developed and well-nourished. No distress.  HENT:  Head: Normocephalic and atraumatic.  Mouth/Throat: No oropharyngeal exudate.  Posterior pharyngeal erythema no exudate.  Neck:  No tender cervical lymphadenopathy.  Cardiovascular: Normal rate, regular rhythm, normal heart sounds and intact distal pulses.  Exam reveals no friction rub.   No murmur heard. Pulmonary/Chest: Effort normal and breath sounds normal. No respiratory distress. He has no wheezes. He has no rales.  Abdominal: Soft. Bowel sounds are normal. He exhibits no distension. There is no tenderness.  Neurological: He is oriented to person, place, and time.  Skin: He is not diaphoretic.  Psychiatric: He has a normal mood and affect. His behavior is normal.    Assessment & Plan:

## 2013-07-14 ENCOUNTER — Other Ambulatory Visit: Payer: Self-pay | Admitting: Cardiology

## 2013-07-14 ENCOUNTER — Other Ambulatory Visit: Payer: Self-pay | Admitting: Internal Medicine

## 2013-07-15 ENCOUNTER — Encounter: Payer: Self-pay | Admitting: Internal Medicine

## 2013-07-15 ENCOUNTER — Ambulatory Visit (HOSPITAL_COMMUNITY)
Admission: RE | Admit: 2013-07-15 | Discharge: 2013-07-15 | Disposition: A | Discharge status: Home / Self Care | Payer: Medicare HMO | Source: Ambulatory Visit | Attending: Internal Medicine | Admitting: Internal Medicine

## 2013-07-15 ENCOUNTER — Ambulatory Visit (INDEPENDENT_AMBULATORY_CARE_PROVIDER_SITE_OTHER): Payer: Commercial Managed Care - HMO | Admitting: Internal Medicine

## 2013-07-15 ENCOUNTER — Telehealth: Payer: Self-pay | Admitting: *Deleted

## 2013-07-15 VITALS — BP 139/89 | HR 57 | Temp 97.4°F | Wt 249.4 lb

## 2013-07-15 DIAGNOSIS — Z7901 Long term (current) use of anticoagulants: Secondary | ICD-10-CM

## 2013-07-15 DIAGNOSIS — R059 Cough, unspecified: Secondary | ICD-10-CM | POA: Insufficient documentation

## 2013-07-15 DIAGNOSIS — J449 Chronic obstructive pulmonary disease, unspecified: Secondary | ICD-10-CM | POA: Insufficient documentation

## 2013-07-15 DIAGNOSIS — R05 Cough: Secondary | ICD-10-CM

## 2013-07-15 DIAGNOSIS — J069 Acute upper respiratory infection, unspecified: Secondary | ICD-10-CM

## 2013-07-15 DIAGNOSIS — J4489 Other specified chronic obstructive pulmonary disease: Secondary | ICD-10-CM | POA: Insufficient documentation

## 2013-07-15 MED ORDER — AZITHROMYCIN 250 MG PO TABS: ORAL_TABLET | ORAL | Status: DC

## 2013-07-15 NOTE — Progress Notes (Signed)
Patient ID: Dylan Morgan, male   DOB: 17-Apr-1945, 68 y.o.   MRN: [id number]    Subjective:   Patient ID: Dylan Morgan male   DOB: 12-Aug-1945 68 y.o.   MRN: [id number]  HPI: [name] is a 68 y.o. man with complicated pmhx detailed below who presents for worsening rhinorrhea and cough since being seen in [facility] four days ago. He reports that this morning he began to cough yellow phlegm. He thinks that it feel like this is running down the back of his throat. He denies fevers, chills. He admits to muscle weakness. He admits to sore throat. Patient requests ABX.    Past Medical History  Diagnosis Date  . CAD (coronary artery disease) CABG in 2004    Nuclear, October, 2010, no ischemia, EF 71%  . GERD (gastroesophageal reflux disease)   . Hypertension   . Hyperlipidemia   . Renal insufficiency   . Cellulitis and abscess of leg     right leg  . Tobacco abuse   . Chronic back pain   . Chronic neck pain   . Seborrheic dermatitis of scalp   . Abnormal LFTs     Hx of abnormal LFT's  . Bradycardia   . AAA (abdominal aortic aneurysm)     November 2010, Stenting, November, 2010  . Paroxysmal atrial fibrillation 02/22/2009    diltiazem po started but discontinued due to side effects.  . Ejection fraction     EF 40% in the past  /   EF 55-65% echo, November, 2006  . Hx of CABG     2004  . Carotid artery disease     Doppler, October, 1884, and old LICA occlusion , RICA no significant abnormality  . Arthritis   . Prediabetes   . CVA (cerebral infarction)     R Carona radiata stroke 08/16/2011  . Stroke 11/26/11   Current Outpatient Prescriptions  Medication Sig Dispense Refill  . aspirin EC 81 MG EC tablet Take 1 tablet (81 mg total) by mouth daily.      . cetirizine (ZYRTEC) 10 MG tablet Take 1 tablet (10 mg total) by mouth daily.  100 tablet  1  . fluticasone (FLONASE) 50 MCG/ACT nasal spray Place 1 spray into both nostrils daily.  16 g  2  . lisinopril (PRINIVIL,ZESTRIL) 10 MG  tablet Take 1 tablet (10 mg total) by mouth daily.  30 tablet  3  . phenol (CHLORASEPTIC) 1.4 % LIQD Use as directed 1 spray in the mouth or throat as needed for throat irritation / pain.  20 mL  0  . simvastatin (ZOCOR) 10 MG tablet Take 1 tablet (10 mg total) by mouth at bedtime.  90 tablet  6  . sodium chloride (OCEAN) 0.65 % SOLN nasal spray Place 2 sprays into both nostrils as needed for congestion.  45 mL  0  . warfarin (COUMADIN) 5 MG tablet 1 tablet (5mg ) daily or as directed by coumadin clinic  35 tablet  3  . Miconazole POWD Apply 1 application topically 2 (two) times daily. Apply to both feet and toes for 4 weeks  100 g  0  . Multiple Vitamins-Minerals (MULTIVITAMIN WITH MINERALS) tablet Take 1 tablet by mouth at bedtime.        No current facility-administered medications for this visit.   Family History  Problem Relation Age of Onset  . Diabetes Mother   . Stroke Father   . Cancer Sister     thyroid  cancer  . Heart disease Brother     Coronary artery disease   History   Social History  . Marital Status: Single    Spouse Name: N/A    Number of Children: N/A  . Years of Education: N/A   Occupational History  . disabled    Social History Main Topics  . Smoking status: Former Smoker -- 0.50 packs/day for 35 years    Types: Cigarettes    Quit date: 02/03/2010  . Smokeless tobacco: Never Used  . Alcohol Use: No  . Drug Use: No  . Sexual Activity: None   Other Topics Concern  . None   Social History Narrative   Lives with son and his significant other, denies being married. Has 3 daughters & a son.  Several grandchildren.    Divorced.   Regular exercise.   Prior to retirement, hung dry wall for a living.    Review of Systems: Pertinent items are noted in HPI. Objective:  Physical Exam: Filed Vitals:   07/15/13 1052  BP: 139/89  Pulse: 57  Temp: 97.4 F (36.3 C)  TempSrc: Oral  Weight: 249 lb 6.4 oz (113.127 kg)  SpO2: 97%   Physical Exam    Constitutional: He is oriented to person, place, and time. He appears well-developed and well-nourished. No distress.  HENT:  Mild posterior pharyngeal erythema. Mild nasal mucosa erythema. No tonsillar exudate.  Cardiovascular: Normal rate, regular rhythm, normal heart sounds and intact distal pulses.  Exam reveals no friction rub.   No murmur heard. Pulmonary/Chest: Effort normal and breath sounds normal. No respiratory distress. He has no wheezes. He has no rales.  Neurological: He is alert and oriented to person, place, and time.  Skin: He is not diaphoretic.  Psychiatric: He has a normal mood and affect. His behavior is normal.    Assessment & Plan:

## 2013-07-15 NOTE — Assessment & Plan Note (Addendum)
Patient continues to complain of nasal drainage and worsening cough. I explained that his symptoms are consistent with URI caused by a virus. The patient continues to request ABX as he states this has helped in him in the past.  I ordered a CXR. It revealed no acute process. Possible scarring vs atelectasis in the right base.   I do not believe that patient has an PNA at this time.   I believe the patient likely has an URI of viral etiology but a possible early bacterial sinusitis is possible. The patient adamently requested ABX. I explained the risk of abx (bleeding risk, and diarrhea, and prolonged QT, and failure to improve with ABX). The patient voiced understanding of these risks and elected to pursue this coarse for the possibility of bacterial sinusitis.

## 2013-07-15 NOTE — Telephone Encounter (Signed)
Pt called feeling worse - ears are hurting, throat is sore and yellow productive cough. Talked with Dr Margart Sickles - will see now. Pt aware.Hilda Blades Qusai Kem RN 07/15/13 10:25AM

## 2013-07-15 NOTE — Patient Instructions (Addendum)
I have prescribed you antibiotics for a possible acute sinusitis. We discussed the risks of taking antibiotics. Specifically, we talked about the increased bleeding risk due to coumadin therapy.  Please do not take your coumadin tomorrow. Please have your INR checked on Friday with your coumadin clinic.  Sinusitis Sinusitis is redness, soreness, and swelling (inflammation) of the paranasal sinuses. Paranasal sinuses are air pockets within the bones of your face (beneath the eyes, the middle of the forehead, or above the eyes). In healthy paranasal sinuses, mucus is able to drain out, and air is able to circulate through them by way of your nose. However, when your paranasal sinuses are inflamed, mucus and air can become trapped. This can allow bacteria and other germs to grow and cause infection. Sinusitis can develop quickly and last only a short time (acute) or continue over a long period (chronic). Sinusitis that lasts for more than 12 weeks is considered chronic.  CAUSES  Causes of sinusitis include:  Allergies.  Structural abnormalities, such as displacement of the cartilage that separates your nostrils (deviated septum), which can decrease the air flow through your nose and sinuses and affect sinus drainage.  Functional abnormalities, such as when the small hairs (cilia) that line your sinuses and help remove mucus do not work properly or are not present. SYMPTOMS  Symptoms of acute and chronic sinusitis are the same. The primary symptoms are pain and pressure around the affected sinuses. Other symptoms include:  Upper toothache.  Earache.  Headache.  Bad breath.  Decreased sense of smell and taste.  A cough, which worsens when you are lying flat.  Fatigue.  Fever.  Thick drainage from your nose, which often is green and may contain pus (purulent).  Swelling and warmth over the affected sinuses. DIAGNOSIS  Your caregiver will perform a physical exam. During the exam, your  caregiver may:  Look in your nose for signs of abnormal growths in your nostrils (nasal polyps).  Tap over the affected sinus to check for signs of infection.  View the inside of your sinuses (endoscopy) with a special imaging device with a light attached (endoscope), which is inserted into your sinuses. If your caregiver suspects that you have chronic sinusitis, one or more of the following tests may be recommended:  Allergy tests.  Nasal culture A sample of mucus is taken from your nose and sent to a lab and screened for bacteria.  Nasal cytology A sample of mucus is taken from your nose and examined by your caregiver to determine if your sinusitis is related to an allergy. TREATMENT  Most cases of acute sinusitis are related to a viral infection and will resolve on their own within 10 days. Sometimes medicines are prescribed to help relieve symptoms (pain medicine, decongestants, nasal steroid sprays, or saline sprays).  However, for sinusitis related to a bacterial infection, your caregiver will prescribe antibiotic medicines. These are medicines that will help kill the bacteria causing the infection.  Rarely, sinusitis is caused by a fungal infection. In theses cases, your caregiver will prescribe antifungal medicine. For some cases of chronic sinusitis, surgery is needed. Generally, these are cases in which sinusitis recurs more than 3 times per year, despite other treatments. HOME CARE INSTRUCTIONS   Drink plenty of water. Water helps thin the mucus so your sinuses can drain more easily.  Use a humidifier.  Inhale steam 3 to 4 times a day (for example, sit in the bathroom with the shower running).  Apply a warm,  moist washcloth to your face 3 to 4 times a day, or as directed by your caregiver.  Use saline nasal sprays to help moisten and clean your sinuses.  Take over-the-counter or prescription medicines for pain, discomfort, or fever only as directed by your caregiver. SEEK  IMMEDIATE MEDICAL CARE IF:  You have increasing pain or severe headaches.  You have nausea, vomiting, or drowsiness.  You have swelling around your face.  You have vision problems.  You have a stiff neck.  You have difficulty breathing. MAKE SURE YOU:   Understand these instructions.  Will watch your condition.  Will get help right away if you are not doing well or get worse. Document Released: 03/20/2005 Document Revised: 06/12/2011 Document Reviewed: 04/04/2011 [facility] Patient Information 2014 [place], [place].

## 2013-07-16 NOTE — Assessment & Plan Note (Signed)
As patient is on coumadin and start azithromycin he is at elevated risk for elevation of INR and potential bleed. I recommended the patient to skip his dose of coumadin on Weds(tomorrow) and schedule an INR check for Friday.

## 2013-07-16 NOTE — Progress Notes (Signed)
Case discussed with Dr. Komanski at the time of the visit.  We reviewed the resident's history and exam and pertinent patient test results.  I agree with the assessment, diagnosis, and plan of care documented in the resident's note.      

## 2013-07-17 NOTE — Progress Notes (Signed)
Case discussed with Dr. Komanski at the time of the visit.  We reviewed the resident's history and exam and pertinent patient test results.  I agree with the assessment, diagnosis, and plan of care documented in the resident's note.      

## 2013-07-28 ENCOUNTER — Encounter: Payer: Self-pay | Admitting: Cardiology

## 2013-07-28 ENCOUNTER — Ambulatory Visit (INDEPENDENT_AMBULATORY_CARE_PROVIDER_SITE_OTHER): Payer: Commercial Managed Care - HMO | Admitting: Pharmacist

## 2013-07-28 ENCOUNTER — Ambulatory Visit (INDEPENDENT_AMBULATORY_CARE_PROVIDER_SITE_OTHER): Payer: Medicare HMO | Admitting: Cardiology

## 2013-07-28 VITALS — BP 114/80 | HR 80 | Ht 71.0 in | Wt 242.8 lb

## 2013-07-28 DIAGNOSIS — E785 Hyperlipidemia, unspecified: Secondary | ICD-10-CM

## 2013-07-28 DIAGNOSIS — Z7901 Long term (current) use of anticoagulants: Secondary | ICD-10-CM

## 2013-07-28 DIAGNOSIS — I779 Disorder of arteries and arterioles, unspecified: Secondary | ICD-10-CM

## 2013-07-28 DIAGNOSIS — I4891 Unspecified atrial fibrillation: Secondary | ICD-10-CM

## 2013-07-28 DIAGNOSIS — I2510 Atherosclerotic heart disease of native coronary artery without angina pectoris: Secondary | ICD-10-CM

## 2013-07-28 DIAGNOSIS — Z5181 Encounter for therapeutic drug level monitoring: Secondary | ICD-10-CM

## 2013-07-28 DIAGNOSIS — I480 Paroxysmal atrial fibrillation: Secondary | ICD-10-CM

## 2013-07-28 DIAGNOSIS — I6350 Cerebral infarction due to unspecified occlusion or stenosis of unspecified cerebral artery: Secondary | ICD-10-CM

## 2013-07-28 DIAGNOSIS — R0602 Shortness of breath: Secondary | ICD-10-CM

## 2013-07-28 DIAGNOSIS — I639 Cerebral infarction, unspecified: Secondary | ICD-10-CM

## 2013-07-28 DIAGNOSIS — I10 Essential (primary) hypertension: Secondary | ICD-10-CM

## 2013-07-28 DIAGNOSIS — I739 Peripheral vascular disease, unspecified: Secondary | ICD-10-CM

## 2013-07-28 LAB — POCT INR: INR: 1.8

## 2013-07-28 NOTE — Assessment & Plan Note (Addendum)
He says that he has had shortness of breath for a long time. However he is bothered by it now and there is some diaphoresis associated with it. We need to be sure that this is not an anginal equivalent. Nuclear stress study will be done. He had a recent chest x-ray. There was no obvious CHF.  As part of today's evaluation I spent greater than 25 minutes with is total care. More than half of this time is with direct contact with the patient. We have talked at length about his symptoms and about his prior history.

## 2013-07-28 NOTE — Assessment & Plan Note (Signed)
He continues on Coumadin for his atrial fibrillation.

## 2013-07-28 NOTE — Patient Instructions (Signed)
Schedule Limited walking Lexiscan follow instructions given   Your physician recommends that you schedule a follow-up appointment in: 8 weeks

## 2013-07-28 NOTE — Assessment & Plan Note (Signed)
His rhythm today is sinus. He is anticoagulated.

## 2013-07-28 NOTE — Assessment & Plan Note (Signed)
His carotids are followed very carefully by vascular surgery.

## 2013-07-28 NOTE — Assessment & Plan Note (Signed)
The patient is post CABG in 2004. Nuclear scan in 2010 revealed no significant ischemia. It is now but 5 years. He is having exertional diaphoresis along with his shortness of breath with exertion. There is no proof that this is ischemia but we need to be sure. We will proceed with a pharmacologic stress study. I will try to see if he can walk a little bit with this.

## 2013-07-28 NOTE — Assessment & Plan Note (Signed)
Current guidelines recommend that he should be on 40 mg of Lipitor. We will review this with him to see if this is a possibility for him to change

## 2013-07-28 NOTE — Progress Notes (Signed)
Patient ID: Dylan Morgan, male   DOB: Nov 01, 1945, 68 y.o.   MRN: [id number]    HPI  Patient is seen today to followup history of coronary disease. I saw him last January, 2014. Historically he said good left ventricular function. He underwent CABG in 2004. Echo in 2013 revealed a normal ejection fraction of 60% or higher. Recently he's had ongoing shortness of breath. He is being assessed by his primary team. He's not having any chest pain. Initially before his CABG he did have chest pain. With his current shortness of breath however he has diaphoresis. He has not had any syncope or presyncope.  As part of today's evaluation I have reviewed the old records and I have reviewed the recent primary care notes and plans.  Allergies  Allergen Reactions  . Diltiazem Hcl Itching  . Oxycodone-Acetaminophen Other (See Comments)    vertigo    Current Outpatient Prescriptions  Medication Sig Dispense Refill  . aspirin EC 81 MG EC tablet Take 1 tablet (81 mg total) by mouth daily.      Marland Kitchen azithromycin (ZITHROMAX) 250 MG tablet Take two tablets today, then 1 tablet daily for 4 more days.  6 each  0  . cetirizine (ZYRTEC) 10 MG tablet Take 1 tablet (10 mg total) by mouth daily.  100 tablet  1  . fluticasone (FLONASE) 50 MCG/ACT nasal spray Place 1 spray into both nostrils daily.  16 g  2  . lisinopril (PRINIVIL,ZESTRIL) 10 MG tablet Take 1 tablet (10 mg total) by mouth daily.  30 tablet  3  . Miconazole POWD Apply 1 application topically 2 (two) times daily. Apply to both feet and toes for 4 weeks  100 g  0  . Multiple Vitamins-Minerals (MULTIVITAMIN WITH MINERALS) tablet Take 1 tablet by mouth at bedtime.       . phenol (CHLORASEPTIC) 1.4 % LIQD Use as directed 1 spray in the mouth or throat as needed for throat irritation / pain.  20 mL  0  . simvastatin (ZOCOR) 10 MG tablet Take 1 tablet (10 mg total) by mouth at bedtime.  90 tablet  6  . sodium chloride (OCEAN) 0.65 % SOLN nasal spray Place 2 sprays  into both nostrils as needed for congestion.  45 mL  0  . warfarin (COUMADIN) 5 MG tablet 1 tablet (5mg ) daily or as directed by coumadin clinic  35 tablet  3   No current facility-administered medications for this visit.    History   Social History  . Marital Status: Single    Spouse Name: N/A    Number of Children: N/A  . Years of Education: N/A   Occupational History  . disabled    Social History Main Topics  . Smoking status: Former Smoker -- 0.50 packs/day for 35 years    Types: Cigarettes    Quit date: 02/03/2010  . Smokeless tobacco: Never Used  . Alcohol Use: No  . Drug Use: No  . Sexual Activity: Not on file   Other Topics Concern  . Not on file   Social History Narrative   Lives with son and his significant other, denies being married. Has 3 daughters & a son.  Several grandchildren.    Divorced.   Regular exercise.   Prior to retirement, hung dry wall for a living.     Family History  Problem Relation Age of Onset  . Diabetes Mother   . Stroke Father   . Cancer Sister  thyroid cancer  . Heart disease Brother     Coronary artery disease    Past Medical History  Diagnosis Date  . CAD (coronary artery disease) CABG in 2004    Nuclear, October, 2010, no ischemia, EF 71%  . GERD (gastroesophageal reflux disease)   . Hypertension   . Hyperlipidemia   . Renal insufficiency   . Cellulitis and abscess of leg     right leg  . Tobacco abuse   . Chronic back pain   . Chronic neck pain   . Seborrheic dermatitis of scalp   . Abnormal LFTs     Hx of abnormal LFT's  . Bradycardia   . AAA (abdominal aortic aneurysm)     November 2010, Stenting, November, 2010  . Paroxysmal atrial fibrillation 02/22/2009    diltiazem po started but discontinued due to side effects.  . Ejection fraction     EF 40% in the past  /   EF 55-65% echo, November, 2006  . Hx of CABG     2004  . Carotid artery disease     Doppler, October, 0981, and old LICA occlusion , RICA  no significant abnormality  . Arthritis   . Prediabetes   . CVA (cerebral infarction)     R Carona radiata stroke 08/16/2011  . Stroke 11/26/11    Past Surgical History  Procedure Laterality Date  . Coronary artery bypass graft  2004  . Rotator cuff repair    . Abdominal aortic aneurysm repair  2010    Aortic stent repair    Patient Active Problem List   Diagnosis Date Noted  . Acute upper respiratory infections of unspecified site 07/11/2013  . Encounter for therapeutic drug monitoring 05/12/2013  . Nasal congestion 04/14/2013  . Onychomycosis 01/10/2013  . Rash and nonspecific skin eruption 10/09/2012  . Allergic urticaria 08/09/2012  . Chronic anticoagulation 05/01/2012  . Neck pain, chronic 03/13/2012  . Lower extremity pain, bilateral 03/09/2012  . H/O TIA (transient ischemic attack) and stroke 01/02/2012  . Abdominal aneurysm without mention of rupture 01/02/2012  . Occlusion and stenosis of carotid artery without mention of cerebral infarction 12/19/2011  . TIA (transient ischemic attack) 11/23/2011  . Preventative health care 09/20/2011  . CVA (cerebral infarction)   . Prediabetes   . Encounter for long-term (current) use of anticoagulants 08/04/2011  . Carotid artery disease   . CAD (coronary artery disease)   . Hypertension   . Hyperlipidemia   . Bradycardia   . Ejection fraction   . Hx of CABG   . AAA (abdominal aortic aneurysm)   . Leg cramps, sleep related 04/07/2011  . Erectile dysfunction 02/06/2011  . Paroxysmal atrial fibrillation 02/22/2009  . DERMATITIS, SEBORRHEIC 07/10/2008  . DEPRESSIVE DISORDER 02/04/2007  . SLEEP APNEA 02/04/2007  . GERD 02/20/2006  . CKD (chronic kidney disease), stage III 02/20/2006  . BACK PAIN, CHRONIC 02/20/2006    ROS   Patient denies fever, chills, headache, sweats, rash, change in vision, change in hearing, chest pain, nausea vomiting, urinary symptoms. All other systems are reviewed and are negative.  PHYSICAL  EXAM  The patient is oriented to person time and place. He is significantly overweight. In general he is stable. There is no jugulovenous distention. Lungs were a few scattered rhonchi. There is no respiratory distress. Cardiac exam reveals S1 and S2. The abdomen is protuberant but soft. There is no peripheral edema. There are no musculoskeletal deformities. There are no skin rashes.  Filed Vitals:  07/28/13 1505  BP: 114/80  Pulse: 80  Height: 5\' 11"  (1.803 m)  Weight: 242 lb 12.8 oz (110.133 kg)  SpO2: 95%   EKG is done today and reviewed by me. There is sinus rhythm. There are scattered PVCs. There is no change in the QRS from the prior tracing that I have reviewed.  ASSESSMENT & PLAN

## 2013-07-28 NOTE — Assessment & Plan Note (Signed)
Blood pressure is controlled. No change in therapy. 

## 2013-08-05 ENCOUNTER — Other Ambulatory Visit: Payer: Self-pay | Admitting: Internal Medicine

## 2013-08-07 ENCOUNTER — Ambulatory Visit (HOSPITAL_COMMUNITY): Payer: Medicare HMO | Attending: Cardiology | Admitting: Radiology

## 2013-08-07 VITALS — BP 118/76 | Ht 71.0 in | Wt 242.0 lb

## 2013-08-07 DIAGNOSIS — I739 Peripheral vascular disease, unspecified: Secondary | ICD-10-CM

## 2013-08-07 DIAGNOSIS — R0602 Shortness of breath: Secondary | ICD-10-CM | POA: Insufficient documentation

## 2013-08-07 DIAGNOSIS — I4949 Other premature depolarization: Secondary | ICD-10-CM

## 2013-08-07 DIAGNOSIS — I2510 Atherosclerotic heart disease of native coronary artery without angina pectoris: Secondary | ICD-10-CM | POA: Insufficient documentation

## 2013-08-07 DIAGNOSIS — E785 Hyperlipidemia, unspecified: Secondary | ICD-10-CM | POA: Insufficient documentation

## 2013-08-07 DIAGNOSIS — I10 Essential (primary) hypertension: Secondary | ICD-10-CM | POA: Insufficient documentation

## 2013-08-07 DIAGNOSIS — I779 Disorder of arteries and arterioles, unspecified: Secondary | ICD-10-CM

## 2013-08-07 DIAGNOSIS — I480 Paroxysmal atrial fibrillation: Secondary | ICD-10-CM

## 2013-08-07 DIAGNOSIS — Z7901 Long term (current) use of anticoagulants: Secondary | ICD-10-CM | POA: Insufficient documentation

## 2013-08-07 DIAGNOSIS — I4891 Unspecified atrial fibrillation: Secondary | ICD-10-CM | POA: Insufficient documentation

## 2013-08-07 DIAGNOSIS — I6529 Occlusion and stenosis of unspecified carotid artery: Secondary | ICD-10-CM | POA: Insufficient documentation

## 2013-08-07 MED ORDER — TECHNETIUM TC 99M SESTAMIBI GENERIC - CARDIOLITE
33.0000 | Freq: Once | INTRAVENOUS | Status: AC | PRN
  Administered 2013-08-07: 33 via INTRAVENOUS

## 2013-08-07 MED ORDER — TECHNETIUM TC 99M SESTAMIBI GENERIC - CARDIOLITE
10.8000 | Freq: Once | INTRAVENOUS | Status: AC | PRN
  Administered 2013-08-07: 11 via INTRAVENOUS

## 2013-08-07 MED ORDER — REGADENOSON 0.4 MG/5ML IV SOLN
0.4000 mg | Freq: Once | INTRAVENOUS | Status: AC
  Administered 2013-08-07: 0.4 mg via INTRAVENOUS

## 2013-08-07 NOTE — Progress Notes (Signed)
[facility] [address] [place], [place] [address] [phone]    Cardiology Nuclear Med Study  Dylan Morgan is a 68 y.o. male     MRN : [id number]     DOB: 1945/04/23  Procedure Date: 08/07/2013  Nuclear Med Background Indication for Stress Test:  Evaluation for Ischemia and Graft Patency History:  2010 MPI: EF: 71% '13 ECHO: EF: 60%. CAD-MI-CABG Cardiac Risk Factors: Carotid Disease, CVA, Family History - CAD, History of Smoking, Hypertension, Lipids and PVD  Symptoms:  SOB   Nuclear Pre-Procedure Caffeine/Decaff Intake:  None NPO After: 6:00am   Lungs:  clear O2 Sat: 95% on room air. IV 0.9% NS with Angio Cath:  22g  IV Site: R Hand  IV Started by:  Crissie Figures, RN  Chest Size (in):  48 Cup Size: n/a  Height: 5\' 11"  (1.803 m)  Weight:  242 lb (109.77 kg)  BMI:  Body mass index is 33.77 kg/(m^2). Tech Comments:  N/A    Nuclear Med Study 1 or 2 day study: 1 day  Stress Test Type:  Lexiscan  Reading MD: N/A  Order Authorizing Provider:  Dola Argyle, MD  Resting Radionuclide: Technetium 16m Sestamibi  Resting Radionuclide Dose: 11.0 mCi   Stress Radionuclide:  Technetium 66m Sestamibi  Stress Radionuclide Dose: 33.0 mCi           Stress Protocol Rest HR: 53 Stress HR: 64  Rest BP: 118/76 Stress BP: 129/54  Exercise Time (min): n/a METS: n/a   Predicted Max HR: 150 bpm % Max HR: 42.67 bpm Rate Pressure Product: 8256   Dose of Adenosine (mg):  n/a Dose of Lexiscan: 0.4 mg  Dose of Atropine (mg): n/a Dose of Dobutamine: n/a mcg/kg/min (at max HR)  Stress Test Technologist: Perrin Maltese, EMT-P  Nuclear Technologist:  Charlton Amor, CNMT     Rest Procedure:  Myocardial perfusion imaging was performed at rest 45 minutes following the intravenous administration of Technetium 7m Sestamibi. Rest ECG: NSR, low voltage, Tall R in V1-V2, frequent PVCs  Stress Procedure:  The patient received IV Lexiscan 0.4 mg over 15-seconds.   Technetium 50m Sestamibi injected at 30-seconds. This patient was sob with the Lexiscan injection. Quantitative spect images were obtained after a 45 minute delay. Stress ECG: No significant change from baseline ECG  QPS Raw Data Images:  Normal; no motion artifact; normal heart/lung ratio. Stress Images:  There is a very small area of reduced uptake in the apex Rest Images:  Comparison with the stress images reveals no significant change. Subtraction (SDS):  No evidence of ischemia. Transient Ischemic Dilatation (Normal <1.22):  1.23 Lung/Heart Ratio (Normal <0.45):  0.44  Quantitative Gated Spect Images QGS EDV:  n/a QGS ESV:  n/a  Impression Exercise Capacity:  Lexiscan with no exercise. BP Response:  Normal blood pressure response. Clinical Symptoms:  No significant symptoms noted. ECG Impression:  No significant ECG changes with Lexiscan. Comparison with Prior Nuclear Study: No previous nuclear study performed  Overall Impression:  Low risk stress nuclear study with a very small fixed apical defect, likely "apical thinning". A small apical infarction cannot be excluded, especially in the absence of gated images. No ischemia is seen..  LV Ejection Fraction: Study not gated.  LV Wall Motion:  Study not gated  Sanda Klein, MD, [facility] [phone] office [phone] pager

## 2013-08-08 ENCOUNTER — Encounter: Payer: Self-pay | Admitting: Cardiology

## 2013-08-11 ENCOUNTER — Ambulatory Visit (INDEPENDENT_AMBULATORY_CARE_PROVIDER_SITE_OTHER): Payer: Medicare HMO | Admitting: Surgery

## 2013-08-11 DIAGNOSIS — I480 Paroxysmal atrial fibrillation: Secondary | ICD-10-CM

## 2013-08-11 DIAGNOSIS — Z5181 Encounter for therapeutic drug level monitoring: Secondary | ICD-10-CM

## 2013-08-11 DIAGNOSIS — I6350 Cerebral infarction due to unspecified occlusion or stenosis of unspecified cerebral artery: Secondary | ICD-10-CM

## 2013-08-11 DIAGNOSIS — Z7901 Long term (current) use of anticoagulants: Secondary | ICD-10-CM

## 2013-08-11 DIAGNOSIS — I639 Cerebral infarction, unspecified: Secondary | ICD-10-CM

## 2013-08-11 DIAGNOSIS — I4891 Unspecified atrial fibrillation: Secondary | ICD-10-CM

## 2013-08-11 LAB — POCT INR: INR: 2.2

## 2013-08-26 ENCOUNTER — Encounter (HOSPITAL_COMMUNITY): Payer: Self-pay | Admitting: Emergency Medicine

## 2013-08-26 ENCOUNTER — Emergency Department (HOSPITAL_COMMUNITY): Payer: Medicare HMO

## 2013-08-26 ENCOUNTER — Emergency Department (HOSPITAL_COMMUNITY)
Admission: EM | Admit: 2013-08-26 | Discharge: 2013-08-26 | Disposition: A | Discharge status: Home / Self Care | Payer: Medicare HMO | Attending: Emergency Medicine | Admitting: Emergency Medicine

## 2013-08-26 DIAGNOSIS — IMO0002 Reserved for concepts with insufficient information to code with codable children: Secondary | ICD-10-CM | POA: Insufficient documentation

## 2013-08-26 DIAGNOSIS — K219 Gastro-esophageal reflux disease without esophagitis: Secondary | ICD-10-CM | POA: Insufficient documentation

## 2013-08-26 DIAGNOSIS — Z7901 Long term (current) use of anticoagulants: Secondary | ICD-10-CM | POA: Insufficient documentation

## 2013-08-26 DIAGNOSIS — R0989 Other specified symptoms and signs involving the circulatory and respiratory systems: Secondary | ICD-10-CM | POA: Insufficient documentation

## 2013-08-26 DIAGNOSIS — I498 Other specified cardiac arrhythmias: Secondary | ICD-10-CM | POA: Insufficient documentation

## 2013-08-26 DIAGNOSIS — E039 Hypothyroidism, unspecified: Secondary | ICD-10-CM | POA: Insufficient documentation

## 2013-08-26 DIAGNOSIS — M129 Arthropathy, unspecified: Secondary | ICD-10-CM | POA: Insufficient documentation

## 2013-08-26 DIAGNOSIS — R0789 Other chest pain: Secondary | ICD-10-CM

## 2013-08-26 DIAGNOSIS — R079 Chest pain, unspecified: Secondary | ICD-10-CM

## 2013-08-26 DIAGNOSIS — Z872 Personal history of diseases of the skin and subcutaneous tissue: Secondary | ICD-10-CM | POA: Insufficient documentation

## 2013-08-26 DIAGNOSIS — I10 Essential (primary) hypertension: Secondary | ICD-10-CM

## 2013-08-26 DIAGNOSIS — Z87448 Personal history of other diseases of urinary system: Secondary | ICD-10-CM | POA: Insufficient documentation

## 2013-08-26 DIAGNOSIS — R0609 Other forms of dyspnea: Secondary | ICD-10-CM | POA: Insufficient documentation

## 2013-08-26 DIAGNOSIS — Z8673 Personal history of transient ischemic attack (TIA), and cerebral infarction without residual deficits: Secondary | ICD-10-CM | POA: Insufficient documentation

## 2013-08-26 DIAGNOSIS — Z87891 Personal history of nicotine dependence: Secondary | ICD-10-CM | POA: Insufficient documentation

## 2013-08-26 DIAGNOSIS — Z951 Presence of aortocoronary bypass graft: Secondary | ICD-10-CM | POA: Insufficient documentation

## 2013-08-26 DIAGNOSIS — I4891 Unspecified atrial fibrillation: Secondary | ICD-10-CM | POA: Insufficient documentation

## 2013-08-26 DIAGNOSIS — R071 Chest pain on breathing: Secondary | ICD-10-CM | POA: Insufficient documentation

## 2013-08-26 DIAGNOSIS — Z7982 Long term (current) use of aspirin: Secondary | ICD-10-CM | POA: Insufficient documentation

## 2013-08-26 DIAGNOSIS — I2510 Atherosclerotic heart disease of native coronary artery without angina pectoris: Secondary | ICD-10-CM | POA: Insufficient documentation

## 2013-08-26 DIAGNOSIS — Z79899 Other long term (current) drug therapy: Secondary | ICD-10-CM | POA: Insufficient documentation

## 2013-08-26 LAB — BASIC METABOLIC PANEL
BUN: 26 mg/dL — AB (ref 6–23)
CALCIUM: 10.3 mg/dL (ref 8.4–10.5)
CHLORIDE: 103 meq/L (ref 96–112)
CO2: 22 mEq/L (ref 19–32)
CREATININE: 1.84 mg/dL — AB (ref 0.50–1.35)
GFR, EST AFRICAN AMERICAN: 41 mL/min — AB (ref >90)
GFR, EST NON AFRICAN AMERICAN: 35 mL/min — AB (ref >90)
Glucose, Bld: 113 mg/dL — ABNORMAL HIGH (ref 70–99)
Potassium: 4.6 mEq/L (ref 3.7–5.3)
Sodium: 141 mEq/L (ref 137–147)

## 2013-08-26 LAB — CBC
HCT: 40.4 % (ref 39.0–52.0)
Hemoglobin: 13.7 g/dL (ref 13.0–17.0)
MCH: 30.8 pg (ref 26.0–34.0)
MCHC: 33.9 g/dL (ref 30.0–36.0)
MCV: 90.8 fL (ref 78.0–100.0)
PLATELETS: 146 10*3/uL — AB (ref 150–400)
RBC: 4.45 MIL/uL (ref 4.22–5.81)
RDW: 13.8 % (ref 11.5–15.5)
WBC: 5 10*3/uL (ref 4.0–10.5)

## 2013-08-26 LAB — I-STAT TROPONIN, ED: Troponin i, poc: 0.04 ng/mL (ref 0.00–0.08)

## 2013-08-26 LAB — D-DIMER, QUANTITATIVE: D-Dimer, Quant: 0.79 ug/mL-FEU — ABNORMAL HIGH (ref 0.00–0.48)

## 2013-08-26 LAB — PRO B NATRIURETIC PEPTIDE: Pro B Natriuretic peptide (BNP): 536.5 pg/mL — ABNORMAL HIGH (ref 0–125)

## 2013-08-26 LAB — PROTIME-INR
INR: 2.18 — ABNORMAL HIGH (ref 0.00–1.49)
PROTHROMBIN TIME: 23.6 s — AB (ref 11.6–15.2)

## 2013-08-26 MED ORDER — CLONIDINE HCL 0.1 MG PO TABS
0.1000 mg | ORAL_TABLET | Freq: Once | ORAL | Status: AC
  Administered 2013-08-26: 0.1 mg via ORAL
  Filled 2013-08-26: qty 1

## 2013-08-26 MED ORDER — GI COCKTAIL ~~LOC~~
30.0000 mL | Freq: Once | ORAL | Status: AC
  Administered 2013-08-26: 30 mL via ORAL
  Filled 2013-08-26: qty 30

## 2013-08-26 MED ORDER — HYDRALAZINE HCL 20 MG/ML IJ SOLN: 10.0000 mg | Freq: Once | INTRAMUSCULAR | Status: DC

## 2013-08-26 MED ORDER — METHOCARBAMOL 500 MG PO TABS: 500.0000 mg | ORAL_TABLET | Freq: Two times a day (BID) | ORAL | Status: DC

## 2013-08-26 NOTE — ED Provider Notes (Signed)
CSN: [id number]     Arrival date & time 08/26/13  1507 History   First MD Initiated Contact with Patient 08/26/13 1520     Chief Complaint  Patient presents with  . Chest Pain     (Consider location/radiation/quality/duration/timing/severity/associated sxs/prior Treatment) Patient is a 68 y.o. male presenting with chest pain. The history is provided by the patient.  Chest Pain  patient here with son onset of left-sided chest pain which is been constant for the past 3 hours. Pain characterized as pressure and nonradiating. Some associated dyspnea without diaphoresis. No nausea vomiting. Denies any recent history of trauma. Pain started when he was using a pressure washer. Denies any worsening symptoms with movement. Symptoms persisted and although he has a prior history of heart disease has never had chest pain like this. No leg pain or swelling. No recent travel history.  Past Medical History  Diagnosis Date  . CAD (coronary artery disease) CABG in 2004    Nuclear, October, 2010, no ischemia, EF 71%  . GERD (gastroesophageal reflux disease)   . Hypertension   . Hyperlipidemia   . Renal insufficiency   . Cellulitis and abscess of leg     right leg  . Tobacco abuse   . Chronic back pain   . Chronic neck pain   . Seborrheic dermatitis of scalp   . Abnormal LFTs     Hx of abnormal LFT's  . Bradycardia   . AAA (abdominal aortic aneurysm)     November 2010, Stenting, November, 2010  . Paroxysmal atrial fibrillation 02/22/2009    diltiazem po started but discontinued due to side effects.  . Ejection fraction     EF 40% in the past  /   EF 55-65% echo, November, 2006  . Hx of CABG     2004  . Carotid artery disease     Doppler, October, 9485, and old LICA occlusion , RICA no significant abnormality  . Arthritis   . Prediabetes   . CVA (cerebral infarction)     R Carona radiata stroke 08/16/2011  . Stroke 11/26/11   Past Surgical History  Procedure Laterality Date  . Coronary  artery bypass graft  2004  . Rotator cuff repair    . Abdominal aortic aneurysm repair  2010    Aortic stent repair   Family History  Problem Relation Age of Onset  . Diabetes Mother   . Stroke Father   . Cancer Sister     thyroid cancer  . Heart disease Brother     Coronary artery disease   History  Substance Use Topics  . Smoking status: Former Smoker -- 0.50 packs/day for 35 years    Types: Cigarettes    Quit date: 02/03/2010  . Smokeless tobacco: Never Used  . Alcohol Use: No    Review of Systems  Cardiovascular: Positive for chest pain.  All other systems reviewed and are negative.     Allergies  Diltiazem hcl and Oxycodone-acetaminophen  Home Medications   Prior to Admission medications   Medication Sig Start Date End Date Taking? Authorizing Provider  aspirin EC 81 MG EC tablet Take 1 tablet (81 mg total) by mouth daily. 03/13/12   Jerene Pitch, MD  azithromycin (ZITHROMAX) 250 MG tablet Take two tablets today, then 1 tablet daily for 4 more days. 07/15/13   Marrion Coy, MD  cetirizine (ZYRTEC) 10 MG tablet Take 1 tablet (10 mg total) by mouth daily. 08/09/12   Hadassah Pais, MD  fluticasone (FLONASE) 50 MCG/ACT nasal spray Place 1 spray into both nostrils daily. 04/14/13 04/14/14  Clinton Gallant, MD  lisinopril (PRINIVIL,ZESTRIL) 10 MG tablet TAKE ONE TABLET BY MOUTH ONCE DAILY    Juluis Mire, MD  Miconazole POWD Apply 1 application topically 2 (two) times daily. Apply to both feet and toes for 4 weeks 01/10/13   Hester Mates, MD  Multiple Vitamins-Minerals (MULTIVITAMIN WITH MINERALS) tablet Take 1 tablet by mouth at bedtime.     Historical Provider, MD  phenol (CHLORASEPTIC) 1.4 % LIQD Use as directed 1 spray in the mouth or throat as needed for throat irritation / pain. 07/11/13   Marrion Coy, MD  simvastatin (ZOCOR) 10 MG tablet Take 1 tablet (10 mg total) by mouth at bedtime. 08/15/12   Neta Ehlers, MD  sodium chloride (OCEAN) 0.65 % SOLN nasal  spray Place 2 sprays into both nostrils as needed for congestion. 04/14/13   Clinton Gallant, MD  warfarin (COUMADIN) 5 MG tablet 1 tablet (5mg ) daily or as directed by coumadin clinic 07/14/13   Carlena Bjornstad, MD   BP 134/97  Pulse 70  Temp(Src) 97.9 F (36.6 C)  Resp 17  SpO2 98% Physical Exam  Nursing note and vitals reviewed. Constitutional: He is oriented to person, place, and time. He appears well-developed and well-nourished.  Non-toxic appearance. No distress.  HENT:  Head: Normocephalic and atraumatic.  Eyes: Conjunctivae, EOM and lids are normal. Pupils are equal, round, and reactive to light.  Neck: Normal range of motion. Neck supple. No tracheal deviation present. No mass present.  Cardiovascular: Normal rate, regular rhythm and normal heart sounds.  Exam reveals no gallop.   No murmur heard. Pulmonary/Chest: Effort normal and breath sounds normal. No stridor. No respiratory distress. He has no decreased breath sounds. He has no wheezes. He has no rhonchi. He has no rales.  Abdominal: Soft. Normal appearance and bowel sounds are normal. He exhibits no distension. There is no tenderness. There is no rebound and no CVA tenderness.  Musculoskeletal: Normal range of motion. He exhibits no edema and no tenderness.  Neurological: He is alert and oriented to person, place, and time. He has normal strength. No cranial nerve deficit or sensory deficit. GCS eye subscore is 4. GCS verbal subscore is 5. GCS motor subscore is 6.  Skin: Skin is warm and dry. No abrasion and no rash noted.  Psychiatric: He has a normal mood and affect. His speech is normal and behavior is normal.    ED Course  Procedures (including critical care time) Labs Review Labs Reviewed  [place]  D-DIMER, QUANTITATIVE  I-STAT [place], ED    Imaging Review No results found.   EKG Interpretation   Date/Time:  Tuesday Aug 26 2013 15:12:07  EDT Ventricular Rate:  81 PR Interval:  144 QRS Duration: 90 QT Interval:  366 QTC Calculation: 425 R Axis:   -72 Text Interpretation:  Sinus rhythm with sinus arrhythmia with occasional  Premature ventricular complexes Left axis deviation Inferior-posterior  infarct , age undetermined Anterolateral infarct , age undetermined  Abnormal ECG No significant change since last tracing Confirmed by Jenin Birdsall   MD, Ayesha Markwell ([id number]) on 08/26/2013 3:24:02 PM      MDM   Final diagnoses:  None    Patient seen by cardiology and no concern for cardiac etiology of pain. Patient now relates to me that he did have trauma prior to arrival. Reexam shows  now he now tender on his chest wall without evidence of ecchymosis. D-dimer mildly elevated but in light of his trauma to his chest wall I suspect this is etiology. Do not think that this represents pulmonary embolism. He has no evidence of hypoxemia. He is not tachypneic. Pulse rate is stable. He was given clonidine for his hypertension which is improved his blood pressure. We'll treat with muscle accidents and discharge him   Leota Jacobsen, MD 08/26/13 1940

## 2013-08-26 NOTE — ED Notes (Signed)
Discharge instructions reviewed with pt. Pt verbalized understanding.   

## 2013-08-26 NOTE — ED Notes (Signed)
Spoke with IV team to come start IV

## 2013-08-26 NOTE — ED Notes (Signed)
IV team at bedside 

## 2013-08-26 NOTE — ED Notes (Signed)
Per pt sts chest pain that started about 2 hours ago with some nausea. Sts under his left breast. sts he has been using a pressure washer today.

## 2013-08-26 NOTE — H&P (Addendum)
Patient ID: Dylan Morgan MRN: [id number], DOB/AGE: Jun 15, 1945   Admit date: 08/26/2013   Primary Physician: Otis Brace, MD Primary Cardiologist: Dr. Myrtis Ser  Pt. Profile: Dylan Morgan is a 68 y.o. male with a history of CAD s/p CABG 2004, carotid artery disease, PAF on Coumadin, AAA s/p stenting (2010), CVA, tobacco abuse, HTN, HLD, CKD who presented to [facility] today with chest pain.   He was recently seen in the office for SOB associated with diaphoresis. A nuclear study was ordered and performed on 08/08/13. This was read as a low risk stress nuclear study with a very small fixed apical defect, likely "apical thinning". A small apical infarction cannot be excluded, especially in the absence of gated images. No ischemia is seen.   Today he was outside pressure washing when he fell in a hole and hit his left chest and it has been hurting ever since. This chest pain does not feel like his cardiac chest pain. The pain was relieved by a GI cocktail in the ED. He did break out in a sweat but no SOB, n/v. Pain has not recurred. CE negative x 1.   ECG initially with los of anterolateral R waves but tracing repeated and was very similar to previous.     Problem List  Past Medical History  Diagnosis Date  . CAD (coronary artery disease) CABG in 2004    Nuclear, October, 2010, no ischemia, EF 71%  . GERD (gastroesophageal reflux disease)   . Hypertension   . Hyperlipidemia   . Renal insufficiency   . Cellulitis and abscess of leg     right leg  . Tobacco abuse   . Chronic back pain   . Chronic neck pain   . Seborrheic dermatitis of scalp   . Abnormal LFTs     Hx of abnormal LFT's  . Bradycardia   . AAA (abdominal aortic aneurysm)     November 2010, Stenting, November, 2010  . Paroxysmal atrial fibrillation 02/22/2009    diltiazem po started but discontinued due to side effects.  . Ejection fraction     EF 40% in the past  /   EF 55-65% echo, November, 2006  . Hx of CABG    2004  . Carotid artery disease     Doppler, October, 2012, and old LICA occlusion , RICA no significant abnormality  . Arthritis   . Prediabetes   . CVA (cerebral infarction)     R Carona radiata stroke 08/16/2011  . Stroke 11/26/11    Past Surgical History  Procedure Laterality Date  . Coronary artery bypass graft  2004  . Rotator cuff repair    . Abdominal aortic aneurysm repair  2010    Aortic stent repair     Allergies  Allergies  Allergen Reactions  . Diltiazem Hcl Itching  . Oxycodone-Acetaminophen Other (See Comments)    vertigo    Home Medications  Prior to Admission medications   Medication Sig Start Date End Date Taking? Authorizing Provider  aspirin EC 81 MG tablet Take 81 mg by mouth every other day. After supper   Yes Historical Provider, MD  cetirizine (ZYRTEC) 10 MG tablet Take 10 mg by mouth daily after supper.   Yes Historical Provider, MD  fluticasone (FLONASE) 50 MCG/ACT nasal spray Place 1 spray into both nostrils daily as needed (runny nose).   Yes Historical Provider, MD  lisinopril (PRINIVIL,ZESTRIL) 10 MG tablet Take 10 mg by mouth daily after supper.  Yes Historical Provider, MD  Multiple Vitamins-Minerals (MULTIVITAMIN WITH MINERALS) tablet Take 1 tablet by mouth daily after supper.    Yes Historical Provider, MD  simvastatin (ZOCOR) 10 MG tablet Take 10 mg by mouth daily after supper.   Yes Historical Provider, MD  vitamin B-12 (CYANOCOBALAMIN) 1000 MCG tablet Take 1,000 mcg by mouth daily after supper.   Yes Historical Provider, MD  warfarin (COUMADIN) 5 MG tablet Take 5 mg by mouth daily after supper. 4:30pm daily   Yes Historical Provider, MD    Family History  Family History  Problem Relation Age of Onset  . Diabetes Mother   . Stroke Father   . Cancer Sister     thyroid cancer  . Heart disease Brother     Coronary artery disease    Social History  History   Social History  . Marital Status: Single    Spouse Name: N/A    Number  of Children: N/A  . Years of Education: N/A   Occupational History  . disabled    Social History Main Topics  . Smoking status: Former Smoker -- 0.50 packs/day for 35 years    Types: Cigarettes    Quit date: 02/03/2010  . Smokeless tobacco: Never Used  . Alcohol Use: No  . Drug Use: No  . Sexual Activity: Not on file   Other Topics Concern  . Not on file   Social History Narrative   Lives with son and his significant other, denies being married. Has 3 daughters & a son.  Several grandchildren.    Divorced.   Regular exercise.   Prior to retirement, hung dry wall for a living.      Review of Systems General:  No chills, fever, night sweats or weight changes.  Cardiovascular:  ++ chest pain, no dyspnea on exertion, No edema, orthopnea, palpitations, paroxysmal nocturnal dyspnea. Dermatological: No rash, lesions/masses Respiratory: No cough, dyspnea Urologic: No hematuria, dysuria Abdominal:   No nausea, vomiting, diarrhea, bright red blood per rectum, melena, or hematemesis Neurologic:  No visual changes, wkns, changes in mental status. All other systems reviewed and are otherwise negative except as noted above.  Physical Exam  Blood pressure 167/101, pulse 68, temperature 97.9 F (36.6 C), resp. rate 20, SpO2 100.00%.  General: Pleasant, NAD Psych: Normal affect. Neuro: Alert and oriented X 3. Moves all extremities spontaneously. HEENT: Normal  Neck: Supple without bruits or JVD. Lungs:  Resp regular and unlabored, CTA. Heart: RRR no s3, s4, or murmurs. Chest wall and shoulder mildly tender to palpation Abdomen: Soft, non-tender, non-distended, BS + x 4.  Extremities: No clubbing, cyanosis or edema. DP/PT/Radials 2+ and equal bilaterally.  Labs  No results found for this basename: CKTOTAL, CKMB, TROPONINI,  in the last 72 hours Lab Results  Component Value Date   WBC 5.0 08/26/2013   HGB 13.7 08/26/2013   HCT 40.4 08/26/2013   MCV 90.8 08/26/2013   PLT 146*  08/26/2013    Recent Labs Lab 08/26/13 1547  NA 141  K 4.6  CL 103  CO2 22  BUN 26*  CREATININE 1.84*  CALCIUM 10.3  GLUCOSE 113*    Lab Results  Component Value Date   DDIMER 0.79* 08/26/2013     Radiology/Studies  Dg Chest 2 View  08/26/2013   CLINICAL DATA:  Chest pain  EXAM: CHEST - 2 VIEW  COMPARISON:  07/15/2013  FINDINGS: Mild cardiomegaly. Clear lungs. Postoperative change. Increased AP diameter of the chest. No pleural effusion. No  pneumothorax.  IMPRESSION: Cardiomegaly without decompensation.    ECG  Sinus rhythm with sinus arrhythmia with occasional Premature ventricular complexes Left axis deviation Inferior-posterior infarct , age undetermined Anterolateral infarct , age undetermined  ASSESSMENT AND PLAN  Dylan Morgan is a 68 y.o. male with a history of CAD s/p CABG 2004, carotid artery disease, PAF on Coumadin, AAA s/p stenting (2010), CVA, former tobacco abuse, HTN, HLD, CKD who presented to [facility] today with chest pain.   Chest pain --Like musculoskeletal due to fall Troponin x1 negative, ECG with no acute ST or TW changes. -- Recent nuclear stress test ( 08/08/13) Overall Impression: Low risk stress nuclear study with a very small fixed apical defect, likely "apical thinning". A small apical infarction cannot be excluded, especially in the absence of gated images. No ischemia is seen.   PAF- currently in NSR  -- Continue coumadin: INR 2.18  AKI- Creat 1.84. He does have CKD- baseline around 1.4  HTN- elevated up to 196/101. Continue lisinopril. Will give prn clonidine +/- hydralazine    Signed, Thereasa Parkin, PA-C 08/26/2013, 5:59 PM  Pager [phone]]'  Patient seen and examined with Deborha Payment, PA-C. ECGs and labs reviewed. We discussed all aspects of the encounter. I agree with the assessment and plan as stated above.   Suspect this is chest wall pain from fall. He has had recent low-risk Myoview and denies recent exertional symptoms. No  objective signs ischemia. Can d/c home once BP controlled a bit better. F/u with Dr. Myrtis Ser.  Bevelyn Buckles Zane Samson,MD 6:50 PM

## 2013-08-26 NOTE — ED Notes (Signed)
IV team unable to obtain IV at this time

## 2013-08-26 NOTE — ED Notes (Signed)
IV attempted without success by two RNs, IV team paged

## 2013-08-26 NOTE — ED Notes (Signed)
Pt is ready for discharge. Informed pt to stay bedside nurse will bring in discharge paper work.

## 2013-08-26 NOTE — Discharge Instructions (Signed)
Chest Wall Pain Chest wall pain is pain in or around the bones and muscles of your chest. It may take up to 6 weeks to get better. It may take longer if you must stay physically active in your work and activities.  CAUSES  Chest wall pain may happen on its own. However, it may be caused by:  A viral illness like the flu.  Injury.  Coughing.  Exercise.  Arthritis.  Fibromyalgia.  Shingles. HOME CARE INSTRUCTIONS   Avoid overtiring physical activity. Try not to strain or perform activities that cause pain. This includes any activities using your chest or your abdominal and side muscles, especially if heavy weights are used.  Put ice on the sore area.  Put ice in a plastic bag.  Place a towel between your skin and the bag.  Leave the ice on for 15-20 minutes per hour while awake for the first 2 days.  Only take over-the-counter or prescription medicines for pain, discomfort, or fever as directed by your caregiver. SEEK IMMEDIATE MEDICAL CARE IF:   Your pain increases, or you are very uncomfortable.  You have a fever.  Your chest pain becomes worse.  You have new, unexplained symptoms.  You have nausea or vomiting.  You feel sweaty or lightheaded.  You have a cough with phlegm (sputum), or you cough up blood. MAKE SURE YOU:   Understand these instructions.  Will watch your condition.  Will get help right away if you are not doing well or get worse. Document Released: 03/20/2005 Document Revised: 06/12/2011 Document Reviewed: 11/14/2010 [facility] Patient Information 2014 [place], [place].  Chest Pain (Nonspecific) It is often hard to give a specific diagnosis for the cause of chest pain. There is always a chance that your pain could be related to something serious, such as a heart attack or a blood clot in the lungs. You need to follow up with your caregiver for further evaluation. CAUSES   Heartburn.  Pneumonia or bronchitis.  Anxiety or  stress.  Inflammation around your heart (pericarditis) or lung (pleuritis or pleurisy).  A blood clot in the lung.  A collapsed lung (pneumothorax). It can develop suddenly on its own (spontaneous pneumothorax) or from injury (trauma) to the chest.  Shingles infection (herpes zoster virus). The chest wall is composed of bones, muscles, and cartilage. Any of these can be the source of the pain.  The bones can be bruised by injury.  The muscles or cartilage can be strained by coughing or overwork.  The cartilage can be affected by inflammation and become sore (costochondritis). DIAGNOSIS  Lab tests or other studies, such as X-rays, electrocardiography, stress testing, or cardiac imaging, may be needed to find the cause of your pain.  TREATMENT   Treatment depends on what may be causing your chest pain. Treatment may include:  Acid blockers for heartburn.  Anti-inflammatory medicine.  Pain medicine for inflammatory conditions.  Antibiotics if an infection is present.  You may be advised to change lifestyle habits. This includes stopping smoking and avoiding alcohol, caffeine, and chocolate.  You may be advised to keep your head raised (elevated) when sleeping. This reduces the chance of acid going backward from your stomach into your esophagus.  Most of the time, nonspecific chest pain will improve within 2 to 3 days with rest and mild pain medicine. HOME CARE INSTRUCTIONS   If antibiotics were prescribed, take your antibiotics as directed. Finish them even if you start to feel better.  For the next few  days, avoid physical activities that bring on chest pain. Continue physical activities as directed.  Do not smoke.  Avoid drinking alcohol.  Only take over-the-counter or prescription medicine for pain, discomfort, or fever as directed by your caregiver.  Follow your caregiver's suggestions for further testing if your chest pain does not go away.  Keep any follow-up  appointments you made. If you do not go to an appointment, you could develop lasting (chronic) problems with pain. If there is any problem keeping an appointment, you must call to reschedule. SEEK MEDICAL CARE IF:   You think you are having problems from the medicine you are taking. Read your medicine instructions carefully.  Your chest pain does not go away, even after treatment.  You develop a rash with blisters on your chest. SEEK IMMEDIATE MEDICAL CARE IF:   You have increased chest pain or pain that spreads to your arm, neck, jaw, back, or abdomen.  You develop shortness of breath, an increasing cough, or you are coughing up blood.  You have severe back or abdominal pain, feel nauseous, or vomit.  You develop severe weakness, fainting, or chills.  You have a fever. THIS IS AN EMERGENCY. Do not wait to see if the pain will go away. Get medical help at once. Call your local emergency services (911 in [place] Do not drive yourself to the hospital. MAKE SURE YOU:   Understand these instructions.  Will watch your condition.  Will get help right away if you are not doing well or get worse. Document Released: 12/28/2004 Document Revised: 06/12/2011 Document Reviewed: 10/24/2007 [facility] Patient Information 2014 [place].

## 2013-08-26 NOTE — ED Notes (Signed)
Cardiology at bedside.

## 2013-09-01 ENCOUNTER — Ambulatory Visit (INDEPENDENT_AMBULATORY_CARE_PROVIDER_SITE_OTHER): Payer: Commercial Managed Care - HMO | Admitting: *Deleted

## 2013-09-01 DIAGNOSIS — I639 Cerebral infarction, unspecified: Secondary | ICD-10-CM

## 2013-09-01 DIAGNOSIS — I6350 Cerebral infarction due to unspecified occlusion or stenosis of unspecified cerebral artery: Secondary | ICD-10-CM

## 2013-09-01 DIAGNOSIS — Z5181 Encounter for therapeutic drug level monitoring: Secondary | ICD-10-CM

## 2013-09-01 DIAGNOSIS — I4891 Unspecified atrial fibrillation: Secondary | ICD-10-CM

## 2013-09-01 DIAGNOSIS — Z7901 Long term (current) use of anticoagulants: Secondary | ICD-10-CM | POA: Diagnosis not present

## 2013-09-01 DIAGNOSIS — I480 Paroxysmal atrial fibrillation: Secondary | ICD-10-CM

## 2013-09-01 LAB — POCT INR: INR: 2.9

## 2013-09-17 ENCOUNTER — Encounter: Payer: Self-pay | Admitting: Cardiology

## 2013-09-17 ENCOUNTER — Ambulatory Visit (INDEPENDENT_AMBULATORY_CARE_PROVIDER_SITE_OTHER): Payer: Commercial Managed Care - HMO | Admitting: Cardiology

## 2013-09-17 VITALS — BP 120/70 | HR 82 | Ht 71.0 in | Wt 236.0 lb

## 2013-09-17 DIAGNOSIS — I10 Essential (primary) hypertension: Secondary | ICD-10-CM

## 2013-09-17 DIAGNOSIS — I2510 Atherosclerotic heart disease of native coronary artery without angina pectoris: Secondary | ICD-10-CM

## 2013-09-17 DIAGNOSIS — E785 Hyperlipidemia, unspecified: Secondary | ICD-10-CM

## 2013-09-17 MED ORDER — SIMVASTATIN 40 MG PO TABS: 40.0000 mg | ORAL_TABLET | Freq: Every day | ORAL | Status: DC

## 2013-09-17 NOTE — Patient Instructions (Signed)
Your physician has recommended you make the following change in your medication: increase Simvastatin to 40 mg at bedtime.  Your physician wants you to follow-up in: 1 year. You will receive a reminder letter in the mail two months in advance. If you don't receive a letter, please call our office to schedule the follow-up appointment.

## 2013-09-17 NOTE — Assessment & Plan Note (Signed)
I've been trying to see if we can get him switched to a higher dose of a statin such as atorvastatin 40. So far this has not been successful. This will be further assessed with him today.

## 2013-09-17 NOTE — Assessment & Plan Note (Signed)
Coronary disease is stable. No change in therapy. No further workup.

## 2013-09-17 NOTE — Assessment & Plan Note (Signed)
Blood pressures control. No change in therapy. 

## 2013-09-17 NOTE — Progress Notes (Signed)
Patient ID: Dylan Morgan, male   DOB: 30-Mar-1946, 68 y.o.   MRN: [id number]    HPI  Patient is seen today to followup coronary disease. I saw him last April, 2015. We decide to proceed with a stress nuclear study. This showed no significant ischemia. He did fall and have some left-sided chest pain the to come to the emergency room on Aug 26, 2013. There was no evidence of cardiac issues at that time. Since then he stable.  Allergies  Allergen Reactions  . Diltiazem Hcl Itching  . Oxycodone-Acetaminophen Other (See Comments)    vertigo    Current Outpatient Prescriptions  Medication Sig Dispense Refill  . aspirin EC 81 MG tablet Take 81 mg by mouth every other day. After supper      . cetirizine (ZYRTEC) 10 MG tablet Take 10 mg by mouth daily after supper.      . fluticasone (FLONASE) 50 MCG/ACT nasal spray Place 1 spray into both nostrils daily as needed (runny nose).      Marland Kitchen lisinopril (PRINIVIL,ZESTRIL) 10 MG tablet Take 10 mg by mouth daily after supper.      . Multiple Vitamins-Minerals (MULTIVITAMIN WITH MINERALS) tablet Take 1 tablet by mouth daily after supper.       . simvastatin (ZOCOR) 10 MG tablet Take 10 mg by mouth daily after supper.      . vitamin B-12 (CYANOCOBALAMIN) 1000 MCG tablet Take 1,000 mcg by mouth daily after supper.      . warfarin (COUMADIN) 5 MG tablet Take 5 mg by mouth daily after supper. 4:30pm daily      . methocarbamol (ROBAXIN) 500 MG tablet Take 1 tablet (500 mg total) by mouth 2 (two) times daily.  20 tablet  0   No current facility-administered medications for this visit.    History   Social History  . Marital Status: Single    Spouse Name: N/A    Number of Children: N/A  . Years of Education: N/A   Occupational History  . disabled    Social History Main Topics  . Smoking status: Former Smoker -- 0.50 packs/day for 35 years    Types: Cigarettes    Quit date: 02/03/2010  . Smokeless tobacco: Never Used  . Alcohol Use: No  . Drug Use: No   . Sexual Activity: Not on file   Other Topics Concern  . Not on file   Social History Narrative   Lives with son and his significant other, denies being married. Has 3 daughters & a son.  Several grandchildren.    Divorced.   Regular exercise.   Prior to retirement, hung dry wall for a living.     Family History  Problem Relation Age of Onset  . Diabetes Mother   . Stroke Father   . Cancer Sister     thyroid cancer  . Heart disease Brother     Coronary artery disease    Past Medical History  Diagnosis Date  . CAD (coronary artery disease) CABG in 2004    a. 2004 s/p CABG  . GERD (gastroesophageal reflux disease)   . Hypertension   . Hyperlipidemia   . Renal insufficiency   . Tobacco abuse   . Seborrheic dermatitis of scalp   . Bradycardia   . AAA (abdominal aortic aneurysm)     a. 2010 s/p stenting  . Paroxysmal atrial fibrillation 02/22/2009    a. on Coumadin   . Carotid artery disease     a.  2012 dopplers with old LICA occlusion , RICA no significant abnormality  . Arthritis   . Prediabetes   . CVA (cerebral infarction)     a. 2013 R Carona radiata stroke     Past Surgical History  Procedure Laterality Date  . Coronary artery bypass graft  2004  . Rotator cuff repair    . Abdominal aortic aneurysm repair  2010    Aortic stent repair    Patient Active Problem List   Diagnosis Date Noted  . Chest pain 08/26/2013  . Shortness of breath 07/28/2013  . Acute upper respiratory infections of unspecified site 07/11/2013  . Encounter for therapeutic drug monitoring 05/12/2013  . Nasal congestion 04/14/2013  . Onychomycosis 01/10/2013  . Rash and nonspecific skin eruption 10/09/2012  . Allergic urticaria 08/09/2012  . Chronic anticoagulation 05/01/2012  . Neck pain, chronic 03/13/2012  . Lower extremity pain, bilateral 03/09/2012  . H/O TIA (transient ischemic attack) and stroke 01/02/2012  . Abdominal aneurysm without mention of rupture 01/02/2012  .  Occlusion and stenosis of carotid artery without mention of cerebral infarction 12/19/2011  . TIA (transient ischemic attack) 11/23/2011  . Preventative health care 09/20/2011  . CVA (cerebral infarction)   . Prediabetes   . Encounter for long-term (current) use of anticoagulants 08/04/2011  . Carotid artery disease   . CAD (coronary artery disease)   . Hypertension   . Hyperlipidemia   . Bradycardia   . Ejection fraction   . Hx of CABG   . AAA (abdominal aortic aneurysm)   . Leg cramps, sleep related 04/07/2011  . Erectile dysfunction 02/06/2011  . Paroxysmal atrial fibrillation 02/22/2009  . DERMATITIS, SEBORRHEIC 07/10/2008  . DEPRESSIVE DISORDER 02/04/2007  . SLEEP APNEA 02/04/2007  . GERD 02/20/2006  . CKD (chronic kidney disease), stage III 02/20/2006  . BACK PAIN, CHRONIC 02/20/2006    ROS   Patient denies fever, chills, headache, sweats, rash, change in vision, change in hearing, chest pain, cough, nausea vomiting, urinary symptoms. All other systems are reviewed and are negative.  PHYSICAL EXAM  Patient's overweight. He is oriented to person time and place. Affect is normal. Head is atraumatic. Sclera and conjunctiva are normal. There is no jugular venous distention. Lungs are clear. Respiratory effort is nonlabored. Cardiac exam reveals S1 and S2. The abdomen is soft. There is no peripheral edema. There no musculoskeletal deformities. There are no skin rashes.  Filed Vitals:   09/17/13 0956  BP: 120/70  Pulse: 82  Height: 5\' 11"  (1.803 m)  Weight: 236 lb (107.049 kg)  SpO2: 97%     ASSESSMENT & PLAN

## 2013-09-30 ENCOUNTER — Ambulatory Visit (INDEPENDENT_AMBULATORY_CARE_PROVIDER_SITE_OTHER): Payer: Commercial Managed Care - HMO | Admitting: *Deleted

## 2013-09-30 DIAGNOSIS — I4891 Unspecified atrial fibrillation: Secondary | ICD-10-CM

## 2013-09-30 DIAGNOSIS — I639 Cerebral infarction, unspecified: Secondary | ICD-10-CM

## 2013-09-30 DIAGNOSIS — Z7901 Long term (current) use of anticoagulants: Secondary | ICD-10-CM

## 2013-09-30 DIAGNOSIS — I480 Paroxysmal atrial fibrillation: Secondary | ICD-10-CM

## 2013-09-30 DIAGNOSIS — Z5181 Encounter for therapeutic drug level monitoring: Secondary | ICD-10-CM

## 2013-09-30 DIAGNOSIS — I6350 Cerebral infarction due to unspecified occlusion or stenosis of unspecified cerebral artery: Secondary | ICD-10-CM

## 2013-09-30 LAB — POCT INR: INR: 5.9

## 2013-10-10 ENCOUNTER — Ambulatory Visit (INDEPENDENT_AMBULATORY_CARE_PROVIDER_SITE_OTHER): Payer: Commercial Managed Care - HMO | Admitting: Pharmacist

## 2013-10-10 DIAGNOSIS — I6350 Cerebral infarction due to unspecified occlusion or stenosis of unspecified cerebral artery: Secondary | ICD-10-CM

## 2013-10-10 DIAGNOSIS — I639 Cerebral infarction, unspecified: Secondary | ICD-10-CM

## 2013-10-10 DIAGNOSIS — I480 Paroxysmal atrial fibrillation: Secondary | ICD-10-CM

## 2013-10-10 DIAGNOSIS — Z5181 Encounter for therapeutic drug level monitoring: Secondary | ICD-10-CM

## 2013-10-10 DIAGNOSIS — Z7901 Long term (current) use of anticoagulants: Secondary | ICD-10-CM

## 2013-10-10 DIAGNOSIS — I4891 Unspecified atrial fibrillation: Secondary | ICD-10-CM

## 2013-10-10 LAB — POCT INR: INR: 3

## 2013-10-24 ENCOUNTER — Ambulatory Visit (INDEPENDENT_AMBULATORY_CARE_PROVIDER_SITE_OTHER): Payer: Medicare HMO

## 2013-10-24 DIAGNOSIS — I4891 Unspecified atrial fibrillation: Secondary | ICD-10-CM

## 2013-10-24 DIAGNOSIS — Z7901 Long term (current) use of anticoagulants: Secondary | ICD-10-CM

## 2013-10-24 DIAGNOSIS — I6350 Cerebral infarction due to unspecified occlusion or stenosis of unspecified cerebral artery: Secondary | ICD-10-CM

## 2013-10-24 DIAGNOSIS — I480 Paroxysmal atrial fibrillation: Secondary | ICD-10-CM

## 2013-10-24 DIAGNOSIS — Z5181 Encounter for therapeutic drug level monitoring: Secondary | ICD-10-CM

## 2013-10-24 DIAGNOSIS — I639 Cerebral infarction, unspecified: Secondary | ICD-10-CM

## 2013-10-24 LAB — POCT INR: INR: 3.3

## 2013-11-07 ENCOUNTER — Ambulatory Visit (INDEPENDENT_AMBULATORY_CARE_PROVIDER_SITE_OTHER): Payer: Medicare HMO | Admitting: *Deleted

## 2013-11-07 DIAGNOSIS — I4891 Unspecified atrial fibrillation: Secondary | ICD-10-CM

## 2013-11-07 DIAGNOSIS — I639 Cerebral infarction, unspecified: Secondary | ICD-10-CM

## 2013-11-07 DIAGNOSIS — Z7901 Long term (current) use of anticoagulants: Secondary | ICD-10-CM

## 2013-11-07 DIAGNOSIS — I6350 Cerebral infarction due to unspecified occlusion or stenosis of unspecified cerebral artery: Secondary | ICD-10-CM

## 2013-11-07 DIAGNOSIS — Z5181 Encounter for therapeutic drug level monitoring: Secondary | ICD-10-CM

## 2013-11-07 DIAGNOSIS — I480 Paroxysmal atrial fibrillation: Secondary | ICD-10-CM

## 2013-11-07 LAB — POCT INR: INR: 2.6

## 2013-11-10 ENCOUNTER — Encounter: Payer: Self-pay | Admitting: Vascular Surgery

## 2013-11-11 ENCOUNTER — Other Ambulatory Visit: Payer: Medicare Other

## 2013-11-11 ENCOUNTER — Other Ambulatory Visit (HOSPITAL_COMMUNITY): Payer: Medicare Other

## 2013-11-11 ENCOUNTER — Ambulatory Visit: Payer: Medicare Other | Admitting: Vascular Surgery

## 2013-11-28 ENCOUNTER — Ambulatory Visit (INDEPENDENT_AMBULATORY_CARE_PROVIDER_SITE_OTHER): Payer: Medicare HMO | Admitting: *Deleted

## 2013-11-28 DIAGNOSIS — I4891 Unspecified atrial fibrillation: Secondary | ICD-10-CM

## 2013-11-28 DIAGNOSIS — Z5181 Encounter for therapeutic drug level monitoring: Secondary | ICD-10-CM

## 2013-11-28 DIAGNOSIS — I6350 Cerebral infarction due to unspecified occlusion or stenosis of unspecified cerebral artery: Secondary | ICD-10-CM

## 2013-11-28 DIAGNOSIS — Z7901 Long term (current) use of anticoagulants: Secondary | ICD-10-CM

## 2013-11-28 LAB — POCT INR: INR: 2.7

## 2013-12-05 ENCOUNTER — Other Ambulatory Visit: Payer: Self-pay | Admitting: Cardiology

## 2013-12-05 ENCOUNTER — Encounter: Payer: Self-pay | Admitting: Vascular Surgery

## 2013-12-09 ENCOUNTER — Other Ambulatory Visit (HOSPITAL_COMMUNITY): Payer: Medicare HMO

## 2013-12-09 ENCOUNTER — Other Ambulatory Visit: Payer: Self-pay | Admitting: *Deleted

## 2013-12-09 ENCOUNTER — Other Ambulatory Visit: Payer: Medicare HMO

## 2013-12-09 ENCOUNTER — Ambulatory Visit: Payer: Self-pay | Admitting: Vascular Surgery

## 2013-12-11 ENCOUNTER — Other Ambulatory Visit: Payer: Self-pay | Admitting: Vascular Surgery

## 2013-12-11 LAB — CREATININE, SERUM: Creat: 1.96 mg/dL — ABNORMAL HIGH (ref 0.50–1.35)

## 2013-12-11 LAB — BUN: BUN: 33 mg/dL — ABNORMAL HIGH (ref 6–23)

## 2013-12-12 ENCOUNTER — Ambulatory Visit (HOSPITAL_COMMUNITY)
Admission: RE | Admit: 2013-12-12 | Discharge: 2013-12-12 | Disposition: A | Discharge status: Home / Self Care | Payer: Medicare HMO | Source: Ambulatory Visit | Attending: Vascular Surgery | Admitting: Vascular Surgery

## 2013-12-12 ENCOUNTER — Ambulatory Visit
Admission: RE | Admit: 2013-12-12 | Discharge: 2013-12-12 | Disposition: A | Discharge status: Home / Self Care | Payer: Medicare HMO | Source: Ambulatory Visit | Attending: Vascular Surgery | Admitting: Vascular Surgery

## 2013-12-12 DIAGNOSIS — I6529 Occlusion and stenosis of unspecified carotid artery: Secondary | ICD-10-CM | POA: Diagnosis not present

## 2013-12-12 DIAGNOSIS — Z48812 Encounter for surgical aftercare following surgery on the circulatory system: Secondary | ICD-10-CM

## 2013-12-12 DIAGNOSIS — I714 Abdominal aortic aneurysm, without rupture, unspecified: Secondary | ICD-10-CM

## 2013-12-12 MED ORDER — IOHEXOL 350 MG/ML SOLN
40.0000 mL | Freq: Once | INTRAVENOUS | Status: AC | PRN
  Administered 2013-12-12: 40 mL via INTRAVENOUS

## 2013-12-22 ENCOUNTER — Encounter: Payer: Self-pay | Admitting: Vascular Surgery

## 2013-12-23 ENCOUNTER — Encounter: Payer: Self-pay | Admitting: Vascular Surgery

## 2013-12-23 ENCOUNTER — Ambulatory Visit (INDEPENDENT_AMBULATORY_CARE_PROVIDER_SITE_OTHER): Payer: Medicare HMO | Admitting: Vascular Surgery

## 2013-12-23 ENCOUNTER — Ambulatory Visit: Payer: Self-pay | Admitting: Vascular Surgery

## 2013-12-23 VITALS — BP 135/89 | HR 50 | Resp 18 | Ht 71.0 in | Wt 236.0 lb

## 2013-12-23 DIAGNOSIS — I6529 Occlusion and stenosis of unspecified carotid artery: Secondary | ICD-10-CM

## 2013-12-23 DIAGNOSIS — Z48812 Encounter for surgical aftercare following surgery on the circulatory system: Secondary | ICD-10-CM

## 2013-12-23 DIAGNOSIS — I714 Abdominal aortic aneurysm, without rupture, unspecified: Secondary | ICD-10-CM

## 2013-12-23 DIAGNOSIS — I6523 Occlusion and stenosis of bilateral carotid arteries: Secondary | ICD-10-CM

## 2013-12-23 NOTE — Addendum Note (Signed)
Addended by: Mena Goes on: 12/23/2013 01:03 PM   Modules accepted: Orders

## 2013-12-23 NOTE — Progress Notes (Signed)
Subjective:     Patient ID: Dylan Morgan, male   DOB: 01/28/1946, 68 y.o.   MRN: [id number]  HPI this 68 year old male returns for continued followup regarding his aortic stent graft which placed in 2010 for abdominal aortic aneurysm and his known left ICA occlusion. Previously he has had mild right ICA stenosis and he has been asymptomatic. 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 He has no active symptoms of lateralizing weakness, any aphasia, visual loss in the right eye. He has occasional numbness in both feet. But denies claudication symptoms.  Past Medical History  Diagnosis Date  . CAD (coronary artery disease) CABG in 2004    a. 2004 s/p CABG  . GERD (gastroesophageal reflux disease)   . Hypertension   . Hyperlipidemia   . Renal insufficiency   . Tobacco abuse   . Seborrheic dermatitis of scalp   . Bradycardia   . AAA (abdominal aortic aneurysm)     a. 2010 s/p stenting  . Paroxysmal atrial fibrillation 02/22/2009    a. on Coumadin   . Carotid artery disease     a. 2012 dopplers with old LICA occlusion , RICA no significant abnormality  . Arthritis   . Prediabetes   . CVA (cerebral infarction)     a. 2013 R Carona radiata stroke     History  Substance Use Topics  . Smoking status: Former Smoker -- 0.50 packs/day for 35 years    Types: Cigarettes    Quit date: 02/03/2010  . Smokeless tobacco: Never Used  . Alcohol Use: No    Family History  Problem Relation Age of Onset  . Diabetes Mother   . Stroke Father   . Cancer Sister     thyroid cancer  . Heart disease Brother     Coronary artery disease    Allergies  Allergen Reactions  . Diltiazem Hcl Itching  . Oxycodone-Acetaminophen Other (See Comments)    vertigo    Current outpatient prescriptions:aspirin EC 81 MG tablet, Take 81 mg by mouth every other day. After supper, Disp: , Rfl: ;  cetirizine (ZYRTEC) 10 MG tablet, Take 10 mg by mouth daily after supper.,  Disp: , Rfl: ;  fluticasone (FLONASE) 50 MCG/ACT nasal spray, Place 1 spray into both nostrils daily as needed (runny nose)., Disp: , Rfl: ;  lisinopril (PRINIVIL,ZESTRIL) 10 MG tablet, Take 10 mg by mouth daily after supper., Disp: , Rfl:  methocarbamol (ROBAXIN) 500 MG tablet, Take 1 tablet (500 mg total) by mouth 2 (two) times daily., Disp: 20 tablet, Rfl: 0;  Multiple Vitamins-Minerals (MULTIVITAMIN WITH MINERALS) tablet, Take 1 tablet by mouth daily after supper. , Disp: , Rfl: ;  simvastatin (ZOCOR) 40 MG tablet, Take 1 tablet (40 mg total) by mouth daily after supper., Disp: 30 tablet, Rfl: 11 vitamin B-12 (CYANOCOBALAMIN) 1000 MCG tablet, Take 1,000 mcg by mouth daily after supper., Disp: , Rfl: ;  warfarin (COUMADIN) 5 MG tablet, Take as directed by coumadin clinic, Disp: 35 tablet, Rfl: 3  BP 135/89  Pulse 50  Resp 18  Ht 5\' 11"  (1.803 m)  Wt 236 lb (107.049 kg)  BMI 32.93 kg/m2  Body mass index is 32.93 kg/(m^2).           Review of Systems patient has history of coronary artery disease but denies chest pain. It does have leg discomfort particularly in the feet with ambulation and also while lying flat  as well as lower extremity edema. Also has asthma and wheezing. Complains of numbness in the feet. Other systems negative and a complete review of systems other than chronic blindness left eye    Objective:   Physical Exam BP 135/89  Pulse 50  Resp 18  Ht 5\' 11"  (1.803 m)  Wt 236 lb (107.049 kg)  BMI 32.93 kg/m2  Gen.-alert and oriented x3 in no apparent distress HEENT normal for age-blind left eye  Lungs no rhonchi or wheezing Cardiovascular regular rhythm no murmurs carotid pulses 3+ palpable no bruits audible Abdomen soft nontender no palpable masses Musculoskeletal free of  major deformities  obese Skin clear -no rashes Neurologic normal Lower extremities 3+ femoral and dorsalis pedis pulses palpable bilaterally with no edema  Today I ordered a CT angiogram of  the abdomen and pelvis which are reviewed by computer and also ordered a carotid duplex exam which are reviewed. There is no evidence of endoleak. The aneurysm stent graft is in excellent position with a maximum diameter of 5.2 cm which is stable. Right ICA remains minimally stenotic with less than 40% stenosis with known left ICA occlusion       Assessment:     Doing well 5 years status post aortic stent graft for abdominal aortic aneurysm with no evidence of endoleak Chronic left ICA occlusion with minimal right ICA stenosis-asymptomatic    Plan:     Return in one year for carotid duplex exam and duplex scan of aortic stent graft be performed in the office and see nurse practitioner

## 2013-12-29 ENCOUNTER — Ambulatory Visit (INDEPENDENT_AMBULATORY_CARE_PROVIDER_SITE_OTHER): Payer: Medicare HMO

## 2013-12-29 DIAGNOSIS — Z7901 Long term (current) use of anticoagulants: Secondary | ICD-10-CM

## 2013-12-29 DIAGNOSIS — Z5181 Encounter for therapeutic drug level monitoring: Secondary | ICD-10-CM

## 2013-12-29 DIAGNOSIS — I4891 Unspecified atrial fibrillation: Secondary | ICD-10-CM

## 2013-12-29 DIAGNOSIS — I480 Paroxysmal atrial fibrillation: Secondary | ICD-10-CM

## 2013-12-29 DIAGNOSIS — I639 Cerebral infarction, unspecified: Secondary | ICD-10-CM

## 2013-12-29 DIAGNOSIS — I6350 Cerebral infarction due to unspecified occlusion or stenosis of unspecified cerebral artery: Secondary | ICD-10-CM

## 2013-12-29 LAB — POCT INR: INR: 3.1

## 2014-01-08 ENCOUNTER — Encounter: Payer: Medicare HMO | Admitting: Internal Medicine

## 2014-01-16 ENCOUNTER — Encounter: Payer: Self-pay | Admitting: Internal Medicine

## 2014-01-16 ENCOUNTER — Ambulatory Visit (INDEPENDENT_AMBULATORY_CARE_PROVIDER_SITE_OTHER): Payer: Medicare HMO | Admitting: Internal Medicine

## 2014-01-16 VITALS — BP 140/86 | HR 46 | Temp 97.8°F | Ht 71.0 in | Wt 238.0 lb

## 2014-01-16 DIAGNOSIS — Z23 Encounter for immunization: Secondary | ICD-10-CM

## 2014-01-16 DIAGNOSIS — R001 Bradycardia, unspecified: Secondary | ICD-10-CM

## 2014-01-16 DIAGNOSIS — G629 Polyneuropathy, unspecified: Secondary | ICD-10-CM

## 2014-01-16 DIAGNOSIS — I10 Essential (primary) hypertension: Secondary | ICD-10-CM

## 2014-01-16 DIAGNOSIS — Z299 Encounter for prophylactic measures, unspecified: Secondary | ICD-10-CM

## 2014-01-16 DIAGNOSIS — Z008 Encounter for other general examination: Secondary | ICD-10-CM

## 2014-01-16 LAB — POCT GLYCOSYLATED HEMOGLOBIN (HGB A1C): HEMOGLOBIN A1C: 5.6

## 2014-01-16 LAB — VITAMIN B12: VITAMIN B 12: 405 pg/mL (ref 211–911)

## 2014-01-16 LAB — GLUCOSE, CAPILLARY: Glucose-Capillary: 90 mg/dL (ref 70–99)

## 2014-01-16 MED ORDER — GABAPENTIN 300 MG PO CAPS: 300.0000 mg | ORAL_CAPSULE | Freq: Three times a day (TID) | ORAL | Status: DC

## 2014-01-16 NOTE — Assessment & Plan Note (Signed)
BP Readings from Last 3 Encounters:  01/16/14 140/86  12/23/13 135/89  09/17/13 120/70    Lab Results  Component Value Date   NA 141 08/26/2013   K 4.6 08/26/2013   CREATININE 1.96* 12/11/2013    Assessment: Blood pressure control: controlled Progress toward BP goal:  at goal Comments: Compliant with meds  Plan: Medications:  Lisinopril 10 mg daily. Educational resources provided: Therapist, sports tools provided:   Other plans: Followup routinely.

## 2014-01-16 NOTE — Progress Notes (Addendum)
Patient ID: [name], male   DOB: 05/15/1945, 68 y.o.   MRN: [id number]   Subjective:   HPI: [name] is a 68 y.o. gentleman with a past medical history of coronary artery disease, hypertension, paroxysmal atrial fibrillation, carotid artery stenosis bilaterally, aortic aneurysm, status post repair, prediabetes, among other problems, presents to the clinic for routine visit.  Reason(s) for this visit: 1. Numbness in his feet and hands: Patient describes symptoms of increased numbness in both his feet for the past 4 months. Symptoms have been persistent. He has not sought any treatment. He denies any tingling, pins or needles sensation in his feet. He is nondiabetic but he has previously had some elevation in his A1c with the highest being 6.0% 2years ago. He also reports similar symptoms involving his hands, but these are mild compared to his feet. He has no gait abnormalities. He has no weakness in his lower extremities. He has some history of back problems, which were treated by chiropractic, but he is managing this pain with Tylenol with good response. He has not noticed loss of sphincter control. No headaches dizziness, or lightheadedness. No recent falls. Patient is not alcoholic. He denies intermittent claudication.   ROS: Constitutional: Denies fever, chills, diaphoresis, appetite change and fatigue.  Respiratory: Denies SOB, DOE, cough, chest tightness, and wheezing. Denies chest pain. CVS: No chest pain, palpitations and leg swelling.  GI: No abdominal pain, nausea, vomiting, bloody stools GU: No dysuria, frequency, hematuria, or flank pain.  MSK: No myalgias, back pain, joint swelling, arthralgias  Psych: No depression symptoms. No SI or SA.    Objective:  Physical Exam: Filed Vitals:   01/16/14 1328  BP: 140/86  Pulse: 46  Temp: 97.8 F (36.6 C)  TempSrc: Oral  Height: 5\' 11"  (1.803 m)  Weight: 238 lb (107.956 kg)  SpO2: 99%   General: Well nourished. No  acute distress.  HEENT: Normal oral mucosa. MMM.  Lungs: CTA bilaterally. Noise of breathing, possibly due to underlying COPD Heart: RRR; no extra sounds or murmurs  Abdomen: Non-distended, normal bowel sounds, soft, nontender; no hepatosplenomegaly  Extremities: No pedal edema. No joint swelling or tenderness. Careful examination of his feet reveals loss of sensation to touch, pinprick and proprioception bilaterally to the level of the shins. I cannot appreciate his DP bilaterally, but seems to have good perfusion in the toes. No skin breakdowns. No discoloration. Feet are slightly cold to touch. Scanty hair on both legs bilaterally. Mild loss of sensation in the hands bilaterally. Strength is normal in both upper and lower extremities. Neurologic: Normal EOM,  Alert and oriented x3. No obvious neurologic/cranial nerve deficits.  Assessment & Plan:  Discussed case with my attending in the clinic, Dr. Lynnae January. See problem based charting.

## 2014-01-16 NOTE — Assessment & Plan Note (Signed)
Assessment: Most likely diagnosis: Peripheral neuropathy in view of distribution of his loss of sensation in glove and stocking pattern.  Etiology is not currently clear, but patient is prediabetic with A1c of 6.0% in 2013. Repeated today is 5.4% Ddx: Nutritional deficiency, vitamin B12. Patient is not alcoholic, which rules out alcohol-induced neuropathy. Infectious etiologies cannot be excluded. Peripheral neuropathy is also a possibility given patient's extensive history of arterial disease, including CAD, and aortic aneurysm.   Plan: 1. Labs/imaging: HIV, ANA, RPR, vitamin B12 level, A1C, and referral to neurology for EMG/NCS 2. Therapy: Initiate gabapentin with a slow titration to reach 300 mg 3 times a day. 3. Follow up: In 2 weeks. Further management will depend on the results from above tests. May also consider ABIs.

## 2014-01-16 NOTE — Patient Instructions (Addendum)
General Instructions: Please start taking gabapentin 300 mg at bedtime on day one  Then take 300 mg twice daily on day 2, followed by 300 mg 3 times daily on day 3. I will refer you to neurology for Electromyography/nerve conduction studies We will order some labs here, and I will  call you with the results if they are abnormal, otherwise, they will be discussed when you come back. Please follow up in this office in about 2 weeks.  Please try to bring all your medicines next time. This will help [place] keep you safe from mistakes.   Progress Toward Treatment Goals:  Treatment Goal 01/16/2014  Blood pressure at goal    Self Care Goals & Plans:  Self Care Goal 01/16/2014  Manage my medications take my medicines as prescribed; bring my medications to every visit; refill my medications on time; follow the sick day instructions if I am sick  Monitor my health keep track of my blood pressure  Eat healthy foods eat more vegetables; eat fruit for snacks and desserts; eat foods that are low in salt; eat baked foods instead of fried foods; eat smaller portions  Be physically active find an activity I enjoy    No flowsheet data found.   Care Management & Community Referrals:  Referral 01/10/2013  Referrals made for care management support none needed      Gabapentin capsules or tablets What is this medicine? GABAPENTIN (GA ba pen tin) is used to control partial seizures in adults with epilepsy. It is also used to treat certain types of nerve pain. This medicine may be used for other purposes; ask your health care provider or pharmacist if you have questions. COMMON BRAND NAME(S): Orpha Bur, Neurontin What should I tell my health care provider before I take this medicine? They need to know if you have any of these conditions: -kidney disease -suicidal thoughts, plans, or attempt; a previous suicide attempt by you or a family member -an unusual or allergic reaction to gabapentin, other  medicines, foods, dyes, or preservatives -pregnant or trying to get pregnant -breast-feeding How should I use this medicine? Take this medicine by mouth with a glass of water. Follow the directions on the prescription label. You can take it with or without food. If it upsets your stomach, take it with food.Take your medicine at regular intervals. Do not take it more often than directed. Do not stop taking except on your doctor's advice. If you are directed to break the 600 or 800 mg tablets in half as part of your dose, the extra half tablet should be used for the next dose. If you have not used the extra half tablet within 28 days, it should be thrown away. A special MedGuide will be given to you by the pharmacist with each prescription and refill. Be sure to read this information carefully each time. Talk to your pediatrician regarding the use of this medicine in children. Special care may be needed. Overdosage: If you think you have taken too much of this medicine contact a poison control center or emergency room at once. NOTE: This medicine is only for you. Do not share this medicine with others. What if I miss a dose? If you miss a dose, take it as soon as you can. If it is almost time for your next dose, take only that dose. Do not take double or extra doses. What may interact with this medicine? Do not take this medicine with any of the following medications: -other  gabapentin products This medicine may also interact with the following medications: -alcohol -antacids -antihistamines for allergy, cough and cold -certain medicines for anxiety or sleep -certain medicines for depression or psychotic disturbances -homatropine; hydrocodone -naproxen -narcotic medicines (opiates) for pain -phenothiazines like chlorpromazine, mesoridazine, prochlorperazine, thioridazine This list may not describe all possible interactions. Give your health care provider a list of all the medicines, herbs,  non-prescription drugs, or dietary supplements you use. Also tell them if you smoke, drink alcohol, or use illegal drugs. Some items may interact with your medicine. What should I watch for while using this medicine? Visit your doctor or health care professional for regular checks on your progress. You may want to keep a record at home of how you feel your condition is responding to treatment. You may want to share this information with your doctor or health care professional at each visit. You should contact your doctor or health care professional if your seizures get worse or if you have any new types of seizures. Do not stop taking this medicine or any of your seizure medicines unless instructed by your doctor or health care professional. Stopping your medicine suddenly can increase your seizures or their severity. Wear a medical identification bracelet or chain if you are taking this medicine for seizures, and carry a card that lists all your medications. You may get drowsy, dizzy, or have blurred vision. Do not drive, use machinery, or do anything that needs mental alertness until you know how this medicine affects you. To reduce dizzy or fainting spells, do not sit or stand up quickly, especially if you are an older patient. Alcohol can increase drowsiness and dizziness. Avoid alcoholic drinks. Your mouth may get dry. Chewing sugarless gum or sucking hard candy, and drinking plenty of water will help. The use of this medicine may increase the chance of suicidal thoughts or actions. Pay special attention to how you are responding while on this medicine. Any worsening of mood, or thoughts of suicide or dying should be reported to your health care professional right away. Women who become pregnant while using this medicine may enroll in the [place] Pregnancy Registry by calling [phone]. This registry collects information about the safety of antiepileptic drug use during  pregnancy. What side effects may I notice from receiving this medicine? Side effects that you should report to your doctor or health care professional as soon as possible: -allergic reactions like skin rash, itching or hives, swelling of the face, lips, or tongue -worsening of mood, thoughts or actions of suicide or dying Side effects that usually do not require medical attention (report to your doctor or health care professional if they continue or are bothersome): -constipation -difficulty walking or controlling muscle movements -dizziness -nausea -slurred speech -tiredness -tremors -weight gain This list may not describe all possible side effects. Call your doctor for medical advice about side effects. You may report side effects to FDA at 1-800-FDA-1088. Where should I keep my medicine? Keep out of reach of children. Store at room temperature between 15 and 30 degrees C (59 and 86 degrees F). Throw away any unused medicine after the expiration date. NOTE: This sheet is a summary. It may not cover all possible information. If you have questions about this medicine, talk to your doctor, pharmacist, or health care provider.  2015, Elsevier/Gold Standard. (2012-11-21 09:12:48)

## 2014-01-16 NOTE — Assessment & Plan Note (Signed)
Bradycardia is chronic, and asymptomatic. Has been noted by his cardiologist, who has worse mentioned possibility pacemaker in the future. Pulse rate today is 46. Patient is again asymptomatic. Will defer further management of his cardiologist with follows up with closely.

## 2014-01-17 LAB — HIV ANTIBODY (ROUTINE TESTING W REFLEX): HIV: NONREACTIVE

## 2014-01-17 LAB — RPR

## 2014-01-19 LAB — ANA: ANA: NEGATIVE

## 2014-01-20 NOTE — Progress Notes (Signed)
Internal Medicine Clinic Attending  Case discussed with Dr. Alice Rieger at the time of the visit.  We reviewed the resident's history and exam and pertinent patient test results.  I agree with the assessment, diagnosis, and plan of care documented in the resident's note.

## 2014-01-23 ENCOUNTER — Ambulatory Visit (INDEPENDENT_AMBULATORY_CARE_PROVIDER_SITE_OTHER): Payer: Medicare HMO

## 2014-01-23 DIAGNOSIS — I4891 Unspecified atrial fibrillation: Secondary | ICD-10-CM

## 2014-01-23 DIAGNOSIS — I480 Paroxysmal atrial fibrillation: Secondary | ICD-10-CM

## 2014-01-23 DIAGNOSIS — Z5181 Encounter for therapeutic drug level monitoring: Secondary | ICD-10-CM

## 2014-01-23 DIAGNOSIS — Z7901 Long term (current) use of anticoagulants: Secondary | ICD-10-CM

## 2014-01-23 DIAGNOSIS — I639 Cerebral infarction, unspecified: Secondary | ICD-10-CM

## 2014-01-23 LAB — POCT INR: INR: 2.3

## 2014-01-26 ENCOUNTER — Other Ambulatory Visit: Payer: Self-pay

## 2014-01-26 MED ORDER — SIMVASTATIN 40 MG PO TABS: 40.0000 mg | ORAL_TABLET | Freq: Every day | ORAL | Status: DC

## 2014-01-30 ENCOUNTER — Ambulatory Visit (INDEPENDENT_AMBULATORY_CARE_PROVIDER_SITE_OTHER): Payer: Medicare HMO | Admitting: Internal Medicine

## 2014-01-30 ENCOUNTER — Encounter: Payer: Self-pay | Admitting: Internal Medicine

## 2014-01-30 VITALS — BP 133/79 | HR 47 | Temp 97.6°F | Ht 71.0 in | Wt 238.7 lb

## 2014-01-30 DIAGNOSIS — G629 Polyneuropathy, unspecified: Secondary | ICD-10-CM

## 2014-01-30 MED ORDER — PREGABALIN 50 MG PO CAPS: 50.0000 mg | ORAL_CAPSULE | Freq: Three times a day (TID) | ORAL | Status: DC

## 2014-01-30 NOTE — Patient Instructions (Addendum)
General Instructions: Please stop taking Gabapentin  Please start taking Lyrica 50 mg three times a day Please come back in 2 weeks  Be sure to have the test for your nerves on Thursday  Please bring your medicines with you each time you come to clinic.  Medicines may include prescription medications, over-the-counter medications, herbal remedies, eye drops, vitamins, or other pills.   Progress Toward Treatment Goals:  Treatment Goal 01/16/2014  Blood pressure at goal    Self Care Goals & Plans:  Self Care Goal 01/30/2014  Manage my medications take my medicines as prescribed; bring my medications to every visit  Monitor my health -  Eat healthy foods drink diet soda or water instead of juice or soda; eat more vegetables; eat foods that are low in salt; eat baked foods instead of fried foods; eat fruit for snacks and desserts  Be physically active -    No flowsheet data found.   Care Management & Community Referrals:  Referral 01/10/2013  Referrals made for care management support none needed       Pregabalin capsules What is this medicine? PREGABALIN (pre GAB a lin) is used to treat nerve pain from diabetes, shingles, spinal cord injury, and fibromyalgia. It is also used to control seizures in epilepsy. This medicine may be used for other purposes; ask your health care provider or pharmacist if you have questions. COMMON BRAND NAME(S): Lyrica What should I tell my health care provider before I take this medicine? They need to know if you have any of these conditions: -bleeding problems -heart disease, including heart failure -history of alcohol or drug abuse -kidney disease -suicidal thoughts, plans, or attempt; a previous suicide attempt by you or a family member -an unusual or allergic reaction to pregabalin, gabapentin, other medicines, foods, dyes, or preservatives -pregnant or trying to get pregnant or trying to conceive with your partner -breast-feeding How  should I use this medicine? Take this medicine by mouth with a glass of water. Follow the directions on the prescription label. You can take this medicine with or without food. Take your doses at regular intervals. Do not take your medicine more often than directed. Do not stop taking except on your doctor's advice. A special MedGuide will be given to you by the pharmacist with each prescription and refill. Be sure to read this information carefully each time. Talk to your pediatrician regarding the use of this medicine in children. Special care may be needed. Overdosage: If you think you have taken too much of this medicine contact a poison control center or emergency room at once. NOTE: This medicine is only for you. Do not share this medicine with others. What if I miss a dose? If you miss a dose, take it as soon as you can. If it is almost time for your next dose, take only that dose. Do not take double or extra doses. What may interact with this medicine? -alcohol -certain medicines for blood pressure like captopril, enalapril, or lisinopril -certain medicines for diabetes, like pioglitazone or rosiglitazone -certain medicines for anxiety or sleep -narcotic medicines for pain This list may not describe all possible interactions. Give your health care provider a list of all the medicines, herbs, non-prescription drugs, or dietary supplements you use. Also tell them if you smoke, drink alcohol, or use illegal drugs. Some items may interact with your medicine. What should I watch for while using this medicine? Tell your doctor or healthcare professional if your symptoms do not start  to get better or if they get worse. Visit your doctor or health care professional for regular checks on your progress. Do not stop taking except on your doctor's advice. You may develop a severe reaction. Your doctor will tell you how much medicine to take. Wear a medical identification bracelet or chain if you are  taking this medicine for seizures, and carry a card that describes your disease and details of your medicine and dosage times. You may get drowsy or dizzy. Do not drive, use machinery, or do anything that needs mental alertness until you know how this medicine affects you. Do not stand or sit up quickly, especially if you are an older patient. This reduces the risk of dizzy or fainting spells. Alcohol may interfere with the effect of this medicine. Avoid alcoholic drinks. If you have a heart condition, like congestive heart failure, and notice that you are retaining water and have swelling in your hands or feet, contact your health care provider immediately. The use of this medicine may increase the chance of suicidal thoughts or actions. Pay special attention to how you are responding while on this medicine. Any worsening of mood, or thoughts of suicide or dying should be reported to your health care professional right away. This medicine has caused reduced sperm counts in some men. This may interfere with the ability to father a child. You should talk to your doctor or health care professional if you are concerned about your fertility. Women who become pregnant while using this medicine for seizures may enroll in the [place] Pregnancy Registry by calling ([phone]. This registry collects information about the safety of antiepileptic drug use during pregnancy. What side effects may I notice from receiving this medicine? Side effects that you should report to your doctor or health care professional as soon as possible: -allergic reactions like skin rash, itching or hives, swelling of the face, lips, or tongue -breathing problems -changes in vision -chest pain -confusion -jerking or unusual movements of any part of your body -loss of memory -muscle pain, tenderness, or weakness -suicidal thoughts or other mood changes -swelling of the ankles, feet, hands -unusual  bruising or bleeding Side effects that usually do not require medical attention (Report these to your doctor or health care professional if they continue or are bothersome.): -dizziness -drowsiness -dry mouth -headache -nausea -tremors -trouble sleeping -weight gain This list may not describe all possible side effects. Call your doctor for medical advice about side effects. You may report side effects to FDA at 1-800-FDA-1088. Where should I keep my medicine? Keep out of the reach of children. This medicine can be abused. Keep your medicine in a safe place to protect it from theft. Do not share this medicine with anyone. Selling or giving away this medicine is dangerous and against the law. Store at room temperature between 15 and 30 degrees C (59 and 86 degrees F). Throw away any unused medicine after the expiration date. NOTE: This sheet is a summary. It may not cover all possible information. If you have questions about this medicine, talk to your doctor, pharmacist, or health care provider.  2015, Elsevier/Gold Standard. (2010-09-22 20:00:36)

## 2014-01-30 NOTE — Assessment & Plan Note (Addendum)
Assessment: Most likely diagnosis: Peripheral neuropathy in view of distribution of his loss of sensation in glove and stocking pattern.  Etiology is not currently clear, but patient is prediabetic with A1c of 6.0% in 2013 and on his last visit was 5.4%. Vitamin B12, RPR, ANA, and HIV were all negative. Patient is not alcoholic, which rules out alcohol-induced neuropathy. Peripheral vascular disease cannot be excluded in a patient with extensive vasculopathy. However, patient does not report intermittent claudication, and there is appreciable pulses in his feet. Symptoms have improved since starting gabapentin however, he was unable to tolerate this medication due to dry mouth, and fatigue.  Plan: 1. Labs/imaging: EMG/NCS on 02/05/2014 2. Therapy: Switch to Lyrica 50 mg tid. May increased it to higher dose if needed. Stop Gabapentin.  3. Follow up: In 2 weeks. Further management will depend on the results from EMG/NCS. Will consider ABIs as the next step to rule out PAD.

## 2014-01-30 NOTE — Progress Notes (Signed)
Patient ID: Dylan Morgan, male   DOB: 01-Jan-1946, 68 y.o.   MRN: [id number]  Case discussed with Dr. Alice Rieger soon after the resident saw the patient.  We reviewed the resident's history and exam and pertinent patient test results.  I agree with the assessment, diagnosis, and plan of care documented in the resident's note.

## 2014-01-30 NOTE — Progress Notes (Addendum)
Patient ID: Dylan Morgan, male   DOB: 07/30/1945, 68 y.o.   MRN: [id number]   Subjective:   HPI: [name] is a 68 y.o. gentleman with a past medical history of coronary artery disease, hypertension, paroxysmal atrial fibrillation, carotid artery stenosis bilaterally, aortic aneurysm, status post repair, prediabetes, among other problems, presents to the clinic for routine visit.  Reason(s) for this visit: Numbness in his feet and hands: During his last visit on 01/16/2014, 68 y.o. symptoms/signs of his numbness in the feet and hands were thought to be consistent with peripheral neuropathy of unclear etiology. His labs including vitamin B12, HbA1c, RPR, ANA, HIV, were all negative. Nerve conduction study scheduled for 02/05/2014. I also prescribed gabapentin 300 mg 3 times a day. Patient took this medication for 10 days, but had to stop it due to side effects with dry mouth and increased fatigue. He does not wish to continue with this medication. He tells me that his numbness in the hands and feet had actually improved with this medication, but he cannot tolerated. No new symptoms.   ROS: Constitutional: Denies fever, chills, diaphoresis, appetite change and fatigue.  Respiratory: Denies SOB, DOE, cough, chest tightness, and wheezing. Denies chest pain. CVS: No chest pain, palpitations and leg swelling.  GI: No abdominal pain, nausea, vomiting, bloody stools GU: No dysuria, frequency, hematuria, or flank pain.  MSK: No myalgias, back pain, joint swelling, arthralgias  Psych: No depression symptoms. No SI or SA.    Objective:  Physical Exam: Filed Vitals:   01/30/14 0938  BP: 133/79  Pulse: 47  Temp: 97.6 F (36.4 C)  TempSrc: Oral  Height: 5\' 11"  (1.803 m)  Weight: 238 lb 11.2 oz (108.274 kg)  SpO2: 97%   General: Well nourished. No acute distress.  HEENT: Normal oral mucosa. MMM.  Lungs: CTA bilaterally. Heart: RRR; no extra sounds or murmurs  Abdomen: Non-distended, normal  bowel sounds, soft, nontender; no hepatosplenomegaly  Extremities: No pedal edema. No joint swelling or tenderness. Careful examination of his feet reveals loss of sensation to touch, pinprick and proprioception bilaterally to the level of the shins. Has appreciable DP bilaterally, and good perfusion in the toes. No skin breakdowns. No discoloration. Feet are slightly cold to touch. Scanty hair on both legs bilaterally. Mild loss of sensation in the hands bilaterally. Strength is normal in both upper and lower extremities. Neurologic: Normal EOM,  Alert and oriented x3. No obvious neurologic/cranial nerve deficits.  Assessment & Plan:  Discussed case with my attending in the clinic, Dr. Ellwood Dense. See problem based charting.

## 2014-02-06 ENCOUNTER — Encounter: Payer: Medicare HMO | Admitting: Neurology

## 2014-02-06 NOTE — Addendum Note (Signed)
Addended by: Hulan Fray on: 02/06/2014 05:47 PM   Modules accepted: Orders

## 2014-02-09 ENCOUNTER — Encounter: Payer: Self-pay | Admitting: Neurology

## 2014-02-13 ENCOUNTER — Encounter: Payer: Self-pay | Admitting: Internal Medicine

## 2014-02-13 ENCOUNTER — Ambulatory Visit (INDEPENDENT_AMBULATORY_CARE_PROVIDER_SITE_OTHER): Payer: Medicare HMO | Admitting: Internal Medicine

## 2014-02-13 VITALS — BP 135/61 | HR 49 | Temp 97.7°F | Ht 71.0 in | Wt 233.3 lb

## 2014-02-13 DIAGNOSIS — L600 Ingrowing nail: Secondary | ICD-10-CM | POA: Insufficient documentation

## 2014-02-13 DIAGNOSIS — Z0000 Encounter for general adult medical examination without abnormal findings: Secondary | ICD-10-CM

## 2014-02-13 DIAGNOSIS — Z1211 Encounter for screening for malignant neoplasm of colon: Secondary | ICD-10-CM

## 2014-02-13 DIAGNOSIS — I10 Essential (primary) hypertension: Secondary | ICD-10-CM

## 2014-02-13 DIAGNOSIS — M545 Low back pain: Secondary | ICD-10-CM

## 2014-02-13 DIAGNOSIS — G629 Polyneuropathy, unspecified: Secondary | ICD-10-CM

## 2014-02-13 NOTE — Assessment & Plan Note (Signed)
See HPI. Patient feels like this has largely resolved while on no medications for past two weeks. He had full sensation on exam in all extremities and bilateral full strength. He says he rescheduled his EMG and NCS and we encouraged him to call ahead for directions and go. -encourage purchase of pregabalin 50 mg TID trial if symptoms return -f/u on results of EMG and NCS on next visit -cont to monitor -consider ABI if returns as next step but wnl in 03/2012

## 2014-02-13 NOTE — Patient Instructions (Addendum)
It was a pleasure to see you today. Please keep your appointment for the nerve conduction study. Please continue to soak your toe as we discussed. GI and physical therapy will call you to scheduled your colonoscopy appointment. Please return to clinic or seek medical attention if you have any new or worsening chest pain, trouble breathing, or other worrisome medical condition. We look forward to seeing you again in 2 months.  Lottie Mussel, MD General Instructions:   Please bring your medicines with you each time you come to clinic.  Medicines may include prescription medications, over-the-counter medications, herbal remedies, eye drops, vitamins, or other pills.   Progress Toward Treatment Goals:  Treatment Goal 02/13/2014  Blood pressure at goal    Self Care Goals & Plans:  Self Care Goal 02/13/2014  Manage my medications take my medicines as prescribed; bring my medications to every visit; refill my medications on time  Monitor my health -  Eat healthy foods drink diet soda or water instead of juice or soda; eat more vegetables; eat foods that are low in salt; eat baked foods instead of fried foods  Be physically active -    No flowsheet data found.   Care Management & Community Referrals:  Referral 01/10/2013  Referrals made for care management support none needed

## 2014-02-13 NOTE — Progress Notes (Signed)
Internal Medicine Clinic Attending  I saw and evaluated the patient.  I personally confirmed the key portions of the history and exam documented by Dr. Rothman and I reviewed pertinent patient test results.  The assessment, diagnosis, and plan were formulated together and I agree with the documentation in the resident's note. 

## 2014-02-13 NOTE — Progress Notes (Signed)
   Subjective:    Patient ID: Dylan Morgan, male    DOB: 11-05-1945, 68 y.o.   MRN: [id number]  HPI  Mr Dylan Morgan is a 68 year old man with CAD s/p CABG 2004, HTN, PAF, b/l carotid artery stenosis, AAA s/p stent 2010 here for follow-up. He was seen by Dr Alice Rieger on 10/30 for bilateral hand and feet numbness in a glove and stocking pattern. Initial work-up was negative for vitamin b12, HgbA1c, RPR, ANA, and HIV. He was then referred to an EMG and nerve conduction study scheduled for 11/5. He also was switched at his last visit from gabapentin 300 TID to pregabalin 50 mg TID because gabapentin gave him dry mouth.  Since his last visit, he feels "pretty good." He says the numbness has gone away some. He stopped taking the gabapentin as directed but could not fill the pregabalin because his insurance wouldn't cover it. Mr Dylan Morgan did not get his studies because he could not find the building although he had the address and said no one answered their phone. He said it was rescheduled but is unsure when that is later this month.  Today he is concerned about an ingrown toenail on the R big toe. He says he has had it for months. He says the pain comes and goes. Putting weight on it makes it better and he is unsure what makes it worse. He has had his nail trimmed about a month ago but they did not get the side piece out. He said he used some unknown over the counter lotion on it but it has not helped. He has recently started soaks which he thinks are helpful and it is improving.  Review of Systems  Constitutional: Negative for fever, chills and diaphoresis.  Respiratory: Negative for shortness of breath.   Cardiovascular: Negative for chest pain and palpitations.  Gastrointestinal: Negative for nausea, vomiting, abdominal pain, diarrhea and constipation.  Neurological: Positive for numbness. Negative for dizziness, syncope, weakness, light-headedness and headaches.       Objective:   Physical Exam    Constitutional: He is oriented to person, place, and time. He appears well-developed and well-nourished.  HENT:  Mouth/Throat: Oropharynx is clear and moist.  Eyes: EOM are normal. Pupils are equal, round, and reactive to light.  Cardiovascular: Normal rate, regular rhythm, normal heart sounds and intact distal pulses.  Exam reveals no gallop and no friction rub.   No murmur heard. Pulmonary/Chest: Effort normal and breath sounds normal. No respiratory distress. He has no wheezes.  Abdominal: Soft. Bowel sounds are normal. He exhibits no distension. There is no tenderness.  Musculoskeletal:  2+ pulses in feet. R great toe with thick yellow nail that is tender to palpation on lateral edge where it meets skin. No erythema, swelling, warmth  Neurological: He is alert and oriented to person, place, and time. No cranial nerve deficit.  Sensation intact in all four extremities with b/l 5/5 strength          Assessment & Plan:

## 2014-02-13 NOTE — Assessment & Plan Note (Signed)
See HPI. Dylan Morgan has had this issue for some time and tried several OTC remedies. He recently began soaks in bath which is improving. We encouraged conservation management as there is no erythema, abscess, warmth that would warrant abx or removal at this time -cont to monitor

## 2014-02-13 NOTE — Assessment & Plan Note (Signed)
BP Readings from Last 3 Encounters:  02/13/14 135/61  01/30/14 133/79  01/16/14 140/86    Lab Results  Component Value Date   NA 141 08/26/2013   K 4.6 08/26/2013   CREATININE 1.96* 12/11/2013    Assessment: Blood pressure control: controlled Progress toward BP goal:  at goal Comments: none  Plan: Medications:  continue current medications lisinopril 10 mg daily Educational resources provided:   Self management tools provided:   Other plans: none

## 2014-02-13 NOTE — Assessment & Plan Note (Signed)
Pt given referral for GI for colonoscopy

## 2014-02-20 ENCOUNTER — Ambulatory Visit (INDEPENDENT_AMBULATORY_CARE_PROVIDER_SITE_OTHER): Payer: Medicare HMO

## 2014-02-20 DIAGNOSIS — I480 Paroxysmal atrial fibrillation: Secondary | ICD-10-CM

## 2014-02-20 DIAGNOSIS — I4891 Unspecified atrial fibrillation: Secondary | ICD-10-CM

## 2014-02-20 DIAGNOSIS — I639 Cerebral infarction, unspecified: Secondary | ICD-10-CM

## 2014-02-20 DIAGNOSIS — Z5181 Encounter for therapeutic drug level monitoring: Secondary | ICD-10-CM

## 2014-02-20 DIAGNOSIS — Z7901 Long term (current) use of anticoagulants: Secondary | ICD-10-CM

## 2014-02-20 LAB — POCT INR: INR: 1.8

## 2014-03-03 ENCOUNTER — Encounter: Payer: Self-pay | Admitting: *Deleted

## 2014-03-10 ENCOUNTER — Ambulatory Visit: Payer: Medicare HMO | Attending: Internal Medicine | Admitting: Physical Therapy

## 2014-03-10 DIAGNOSIS — M545 Low back pain, unspecified: Secondary | ICD-10-CM

## 2014-03-10 NOTE — Therapy (Signed)
Outpatient Rehabilitation [facility] [address] [place], [name], [address] Phone: [phone]   Fax:  [phone]  Physical Therapy Evaluation  Patient Details  Name: Dylan Morgan MRN: [id number] Date of Birth: [birth year]  Encounter Date: 03/10/2014      PT End of Session - 03/10/14 1720    Visit Number 1   Number of Visits 12   Date for PT Re-Evaluation 04/21/14   PT Start Time 1630   PT Stop Time 1730   PT Time Calculation (min) 60 min   Activity Tolerance Patient limited by pain      Past Medical History  Diagnosis Date  . CAD (coronary artery disease) CABG in 2004    a. 2004 s/p CABG  . GERD (gastroesophageal reflux disease)   . Hypertension   . Hyperlipidemia   . Renal insufficiency   . Tobacco abuse   . Seborrheic dermatitis of scalp   . Bradycardia   . AAA (abdominal aortic aneurysm)     a. 2010 s/p stenting  . Paroxysmal atrial fibrillation 02/22/2009    a. on Coumadin   . Carotid artery disease     a. 2012 dopplers with old LICA occlusion , RICA no significant abnormality  . Arthritis   . Prediabetes   . CVA (cerebral infarction)     a. 2013 R Carona radiata stroke     Past Surgical History  Procedure Laterality Date  . Coronary artery bypass graft  2004  . Rotator cuff repair    . Abdominal aortic aneurysm repair  2010    Aortic stent repair    There were no vitals taken for this visit.  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  Worsened  over time in back, knees and feet.    Limitations Standing;Walking   How long can you sit comfortably? 45 min   How long can you stand comfortably? 3 hours max   How long can you walk comfortably? 1/4 mile   Diagnostic tests arthritis   Currently in Pain? Yes   Pain Score 6    Pain Location Back  Central   Pain Type  Chronic pain   Pain Onset More than a month ago   Pain Frequency Constant   Aggravating Factors  prolonged positioning;  sweeping;  running   Pain Relieving Factors heat;borrowed brother's back brace but had to return it          [facility] PT Assessment - 03/10/14 1646    Assessment   Medical Diagnosis back pain   Onset Date --  1 year   Next MD Visit --  not scheduled   Prior Therapy --  no   Precautions   Precautions None   Restrictions   Weight Bearing Restrictions No   Balance Screen   Has the patient fallen in the past 6 months No   Has the patient had a decrease in activity level because of a fear of falling?  No   Is the patient reluctant to leave their home because of a fear of falling?  No   Home Environment   Living Enviornment Private residence   Prior Function   Level of Independence Independent with basic ADLs   Vocation --  Hangs drywall   Observation/Other Assessments   Focus on Therapeutic Outcomes (FOTO)  --  60% limit;  predicted 46%   AROM   Lumbar Flexion 40   Lumbar Extension 10   Lumbar - Right Side Bend 20   Lumbar - Left Side Bend 32   Strength   Lumbar Flexion 3-/5   Lumbar Extension 3-/5   Slump test   Findings Negative   Straight Leg Raise   Findings Positive   Side  Right  and left          [facility] Adult PT Treatment/Exercise - 03/10/14 1736    Lumbar Exercises: Sidelying   Other Sidelying Lumbar Exercises --  abdominal brace   Moist Heat Therapy   Number Minutes Moist Heat 15 Minutes   Moist Heat Location --  Lumbar   Electrical Stimulation   Electrical Stimulation Location --  Bilateral lumbar   Electrical Stimulation Action --  Sidelying IFC   Electrical Stimulation Parameters --  10 ma   Electrical Stimulation Goals Pain          PT Education - 03/10/14 1719    Education provided Yes   Education Details Sidelying abdominal brace   Person(s) Educated Patient   Methods Explanation;Demonstration;Handout    Comprehension Verbalized understanding;Returned demonstration;Tactile cues required;Verbal cues required;Need further instruction          PT Short Term Goals - 03/10/14 1737    PT SHORT TERM GOAL #1   Title Patient will be able to activate lower abdominals with ease in sidelying, sitting and standing position to help with standing activities like sweeping.   Time 3   Period Weeks   Status New   PT SHORT TERM GOAL #2   Title Patient will report a 25% improvement in pain using activity modification, body mechanics, possible abdominal brace exercise or use of a back brace for pain control.     Time 3   Period Weeks   Status New          PT Long Term Goals - 03/10/14 1742    PT LONG TERM GOAL #1   Title Patient will be independent in HEP and self managment techniques.   Time 6   Period Weeks   Status New   PT LONG TERM GOAL #2   Title Lumbar flexion improved to 50 degrees and extension to 25 degrees needed for dry wall work.   Time 6   Period Weeks   Status New   PT LONG TERM GOAL #3   Title Core abdominal and trunk extensor strength improved to 4-/5 needed for greater ease and decreased pain with sweeping and other dynamic work activities.     Time 6   Period Weeks   Status New   PT LONG TERM GOAL #4   Title FOTO functional outcome score improved from 60% limit to 46% limit indicating improved function with less pain.   Time 6   Period Weeks   Status New          Plan - 03/10/14 1721    Clinical Impression Statement The patient is a 68 year old male with a 20+ year history of back pain which is progressively worsening over time. Multiple co-morbidities including cardiac surgeries and multi-regional arthritis including the knees.  He works part-time as a Administrator, arts but he has a hard time tolerating because of the pain.  Aggravated with prolonged positions and with sweeping.  Reduced lumbar lordosis. Painful and limited lumbar AROM in all planes.  Tender lumbar  paraspinals.  Weak core muscles 3-/5 abominals and trunk extensors.  Hip abductors 4/5.  FOTO functional outcome score is 60% limited.   Pt will benefit from skilled therapeutic intervention in order to improve on the following deficits Pain;Decreased strength;Decreased range of motion   Rehab Potential Good   PT Frequency 2x / week   PT Duration 6 weeks   PT Treatment/Interventions Moist Heat;Electrical Stimulation;Traction;Therapeutic exercise;Patient/family education;Manual techniques   PT Next Visit Plan Preferred position sidelying abdominal brace progression;  assess response of today's e-stim/heat and continue if beneficial;  body mechanics/postural education;  no directional preference;  bilateral knee pain          G-Codes - 04/03/14 1747    Functional Assessment Tool Used --  [facility] [place]; clinical judgement   Mobility: Walking and Moving Around Current Status (X5883) At least 60 percent but less than 80 percent impaired, limited or restricted   Mobility: Walking and Moving Around Goal Status (G5498) At least 40 percent but less than 60 percent impaired, limited or restricted                            Problem List Patient Active Problem List   Diagnosis Date Noted  . Ingrown right big toenail 02/13/2014  . Peripheral neuropathy 01/16/2014  . Chest pain 08/26/2013  . Shortness of breath 07/28/2013  . Acute upper respiratory infections of unspecified site 07/11/2013  . Encounter for therapeutic drug monitoring 05/12/2013  . Nasal congestion 04/14/2013  . Onychomycosis 01/10/2013  . Rash and nonspecific skin eruption 10/09/2012  . Allergic urticaria 08/09/2012  . Chronic anticoagulation 05/01/2012  . Neck pain, chronic 03/13/2012  . Lower extremity pain, bilateral 03/09/2012  . H/O TIA (transient ischemic attack) and stroke 01/02/2012  . Abdominal aneurysm without mention of rupture 01/02/2012  . Occlusion and stenosis of carotid artery without mention of  cerebral infarction 12/19/2011  . TIA (transient ischemic attack) 11/23/2011  . Preventative health care 09/20/2011  . CVA (cerebral infarction)   . Prediabetes   . Encounter for long-term (current) use of anticoagulants 08/04/2011  . Carotid artery disease   . CAD (coronary artery disease)   . Hypertension   . Hyperlipidemia   . Bradycardia   . Ejection fraction   . Hx of CABG   . AAA (abdominal aortic aneurysm)   . Leg cramps, sleep related 04/07/2011  . Erectile dysfunction 02/06/2011  . Paroxysmal atrial fibrillation 02/22/2009  . DERMATITIS, SEBORRHEIC 07/10/2008  . DEPRESSIVE DISORDER 02/04/2007  . SLEEP APNEA 02/04/2007  . GERD 02/20/2006  . CKD (chronic kidney disease), stage III 02/20/2006  . BACK PAIN, CHRONIC 02/20/2006    Ruben Im C 04-03-2014, 5:50 PM  Ruben Im, PT 04-03-2014 5:51 PM Phone: [phone] Fax: [phone]

## 2014-03-10 NOTE — Patient Instructions (Signed)
Sidelying abdominal brace

## 2014-03-20 ENCOUNTER — Ambulatory Visit (INDEPENDENT_AMBULATORY_CARE_PROVIDER_SITE_OTHER): Payer: Medicare HMO | Admitting: *Deleted

## 2014-03-20 DIAGNOSIS — I4891 Unspecified atrial fibrillation: Secondary | ICD-10-CM

## 2014-03-20 DIAGNOSIS — Z7901 Long term (current) use of anticoagulants: Secondary | ICD-10-CM

## 2014-03-20 DIAGNOSIS — I639 Cerebral infarction, unspecified: Secondary | ICD-10-CM

## 2014-03-20 DIAGNOSIS — Z5181 Encounter for therapeutic drug level monitoring: Secondary | ICD-10-CM

## 2014-03-20 DIAGNOSIS — I480 Paroxysmal atrial fibrillation: Secondary | ICD-10-CM

## 2014-03-20 LAB — POCT INR: INR: 2.3

## 2014-03-31 ENCOUNTER — Encounter: Payer: Self-pay | Admitting: Physical Therapy

## 2014-03-31 NOTE — Therapy (Signed)
[place], [name], [address] Phone: [phone]   Fax:  [phone]  Patient Details  Name: Dylan Morgan MRN: [id number] Date of Birth: 1945-09-26  Encounter Date: 03/31/2014 PHYSICAL THERAPY DISCHARGE SUMMARY  Visits from Start of Care: 1  Current functional level related to goals / functional outcomes: PT LONG TERM GOAL #1    Title Patient will be independent in HEP and self managment techniques.   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   Time 6   Period Weeks   Status Not met   PT LONG TERM GOAL #3   Title Core abdominal and trunk extensor strength improved to 4-/5 needed for greater ease and decreased pain with sweeping and other dynamic work activities.    Time 6   Period Weeks   Status Not met   PT LONG TERM GOAL #4   Title FOTO functional outcome score improved from 60% limit to 46% limit indicating improved function with less pain.   Time 6   Period Weeks   Status Not met              Remaining deficits: The patient did not wish to schedule any appointments on the day of evaluation.  Called and spoke with patient on 03/31/14.  He states that his biggest problem right now were his fallen arches.  He did not wish to schedule any PT appointments at this time.  Will discharge patient from PT at this time at his request.     Education / Equipment:  Plan: Patient agrees to discharge.  Patient goals were not met. Patient is being discharged due to the patient's request.  ?????   Ruben Im, PT 03/31/2014 10:11 AM Phone: [phone] Fax: [phone]   Alvera Singh 03/31/2014, 10:08 AM  [place], [name], [address] Phone:  [phone]   Fax:  [phone]

## 2014-04-07 NOTE — Addendum Note (Signed)
Addended by: Hulan Fray on: 04/07/2014 06:23 PM   Modules accepted: Orders

## 2014-04-17 ENCOUNTER — Ambulatory Visit (INDEPENDENT_AMBULATORY_CARE_PROVIDER_SITE_OTHER): Payer: Medicare HMO

## 2014-04-17 DIAGNOSIS — I480 Paroxysmal atrial fibrillation: Secondary | ICD-10-CM

## 2014-04-17 DIAGNOSIS — I4891 Unspecified atrial fibrillation: Secondary | ICD-10-CM

## 2014-04-17 DIAGNOSIS — Z7901 Long term (current) use of anticoagulants: Secondary | ICD-10-CM

## 2014-04-17 DIAGNOSIS — I639 Cerebral infarction, unspecified: Secondary | ICD-10-CM

## 2014-04-17 DIAGNOSIS — Z5181 Encounter for therapeutic drug level monitoring: Secondary | ICD-10-CM

## 2014-04-17 LAB — POCT INR: INR: 2.9

## 2014-04-26 ENCOUNTER — Other Ambulatory Visit: Payer: Self-pay | Admitting: Cardiology

## 2014-04-29 ENCOUNTER — Emergency Department (HOSPITAL_COMMUNITY)
Admission: EM | Admit: 2014-04-29 | Discharge: 2014-04-29 | Disposition: A | Discharge status: Home / Self Care | Payer: Medicare HMO | Attending: Emergency Medicine | Admitting: Emergency Medicine

## 2014-04-29 ENCOUNTER — Emergency Department (HOSPITAL_COMMUNITY): Payer: Medicare HMO

## 2014-04-29 ENCOUNTER — Encounter (HOSPITAL_COMMUNITY): Payer: Self-pay | Admitting: Emergency Medicine

## 2014-04-29 DIAGNOSIS — Z87891 Personal history of nicotine dependence: Secondary | ICD-10-CM | POA: Insufficient documentation

## 2014-04-29 DIAGNOSIS — I480 Paroxysmal atrial fibrillation: Secondary | ICD-10-CM | POA: Insufficient documentation

## 2014-04-29 DIAGNOSIS — M545 Low back pain, unspecified: Secondary | ICD-10-CM

## 2014-04-29 DIAGNOSIS — I10 Essential (primary) hypertension: Secondary | ICD-10-CM | POA: Diagnosis not present

## 2014-04-29 DIAGNOSIS — I2510 Atherosclerotic heart disease of native coronary artery without angina pectoris: Secondary | ICD-10-CM | POA: Diagnosis not present

## 2014-04-29 DIAGNOSIS — Z872 Personal history of diseases of the skin and subcutaneous tissue: Secondary | ICD-10-CM | POA: Diagnosis not present

## 2014-04-29 DIAGNOSIS — Y9289 Other specified places as the place of occurrence of the external cause: Secondary | ICD-10-CM | POA: Diagnosis not present

## 2014-04-29 DIAGNOSIS — W108XXA Fall (on) (from) other stairs and steps, initial encounter: Secondary | ICD-10-CM | POA: Insufficient documentation

## 2014-04-29 DIAGNOSIS — Z8673 Personal history of transient ischemic attack (TIA), and cerebral infarction without residual deficits: Secondary | ICD-10-CM | POA: Insufficient documentation

## 2014-04-29 DIAGNOSIS — E785 Hyperlipidemia, unspecified: Secondary | ICD-10-CM | POA: Insufficient documentation

## 2014-04-29 DIAGNOSIS — Z7951 Long term (current) use of inhaled steroids: Secondary | ICD-10-CM | POA: Diagnosis not present

## 2014-04-29 DIAGNOSIS — Z7982 Long term (current) use of aspirin: Secondary | ICD-10-CM | POA: Diagnosis not present

## 2014-04-29 DIAGNOSIS — S30810A Abrasion of lower back and pelvis, initial encounter: Secondary | ICD-10-CM | POA: Insufficient documentation

## 2014-04-29 DIAGNOSIS — Z87448 Personal history of other diseases of urinary system: Secondary | ICD-10-CM | POA: Diagnosis not present

## 2014-04-29 DIAGNOSIS — Z7901 Long term (current) use of anticoagulants: Secondary | ICD-10-CM | POA: Insufficient documentation

## 2014-04-29 DIAGNOSIS — S3992XA Unspecified injury of lower back, initial encounter: Secondary | ICD-10-CM | POA: Diagnosis present

## 2014-04-29 DIAGNOSIS — Z8719 Personal history of other diseases of the digestive system: Secondary | ICD-10-CM | POA: Insufficient documentation

## 2014-04-29 DIAGNOSIS — Z79899 Other long term (current) drug therapy: Secondary | ICD-10-CM | POA: Insufficient documentation

## 2014-04-29 DIAGNOSIS — W19XXXA Unspecified fall, initial encounter: Secondary | ICD-10-CM

## 2014-04-29 DIAGNOSIS — Y998 Other external cause status: Secondary | ICD-10-CM | POA: Insufficient documentation

## 2014-04-29 DIAGNOSIS — T148XXA Other injury of unspecified body region, initial encounter: Secondary | ICD-10-CM

## 2014-04-29 DIAGNOSIS — W228XXA Striking against or struck by other objects, initial encounter: Secondary | ICD-10-CM | POA: Insufficient documentation

## 2014-04-29 DIAGNOSIS — Y9389 Activity, other specified: Secondary | ICD-10-CM | POA: Insufficient documentation

## 2014-04-29 DIAGNOSIS — M1990 Unspecified osteoarthritis, unspecified site: Secondary | ICD-10-CM | POA: Diagnosis not present

## 2014-04-29 DIAGNOSIS — Z951 Presence of aortocoronary bypass graft: Secondary | ICD-10-CM | POA: Diagnosis not present

## 2014-04-29 MED ORDER — ACETAMINOPHEN 500 MG PO TABS
1000.0000 mg | ORAL_TABLET | Freq: Once | ORAL | Status: AC
  Administered 2014-04-29: 1000 mg via ORAL
  Filled 2014-04-29: qty 2

## 2014-04-29 NOTE — Discharge Instructions (Signed)
Back Pain, Adult Low back pain is very common. About 1 in 5 people have back pain.The cause of low back pain is rarely dangerous. The pain often gets better over time.About half of people with a sudden onset of back pain feel better in just 2 weeks. About 8 in 10 people feel better by 6 weeks.  CAUSES Some common causes of back pain include:  Strain of the muscles or ligaments supporting the spine.  Wear and tear (degeneration) of the spinal discs.  Arthritis.  Direct injury to the back. DIAGNOSIS Most of the time, the direct cause of low back pain is not known.However, back pain can be treated effectively even when the exact cause of the pain is unknown.Answering your caregiver's questions about your overall health and symptoms is one of the most accurate ways to make sure the cause of your pain is not dangerous. If your caregiver needs more information, he or she may order lab work or imaging tests (X-rays or MRIs).However, even if imaging tests show changes in your back, this usually does not require surgery. HOME CARE INSTRUCTIONS For many people, back pain returns.Since low back pain is rarely dangerous, it is often a condition that people can learn to manageon their own.   Remain active. It is stressful on the back to sit or stand in one place. Do not sit, drive, or stand in one place for more than 30 minutes at a time. Take short walks on level surfaces as soon as pain allows.Try to increase the length of time you walk each day.  Do not stay in bed.Resting more than 1 or 2 days can delay your recovery.  Do not avoid exercise or work.Your body is made to move.It is not dangerous to be active, even though your back may hurt.Your back will likely heal faster if you return to being active before your pain is gone.  Pay attention to your body when you bend and lift. Many people have less discomfortwhen lifting if they bend their knees, keep the load close to their bodies,and  avoid twisting. Often, the most comfortable positions are those that put less stress on your recovering back.  Find a comfortable position to sleep. Use a firm mattress and lie on your side with your knees slightly bent. If you lie on your back, put a pillow under your knees.  Only take over-the-counter or prescription medicines as directed by your caregiver. Over-the-counter medicines to reduce pain and inflammation are often the most helpful.Your caregiver may prescribe muscle relaxant drugs.These medicines help dull your pain so you can more quickly return to your normal activities and healthy exercise.  Put ice on the injured area.  Put ice in a plastic bag.  Place a towel between your skin and the bag.  Leave the ice on for 15-20 minutes, 03-04 times a day for the first 2 to 3 days. After that, ice and heat may be alternated to reduce pain and spasms.  Ask your caregiver about trying back exercises and gentle massage. This may be of some benefit.  Avoid feeling anxious or stressed.Stress increases muscle tension and can worsen back pain.It is important to recognize when you are anxious or stressed and learn ways to manage it.Exercise is a great option. SEEK MEDICAL CARE IF:  You have pain that is not relieved with rest or medicine.  You have pain that does not improve in 1 week.  You have new symptoms.  You are generally not feeling well. SEEK   IMMEDIATE MEDICAL CARE IF:   You have pain that radiates from your back into your legs.  You develop new bowel or bladder control problems.  You have unusual weakness or numbness in your arms or legs.  You develop nausea or vomiting.  You develop abdominal pain.  You feel faint. Document Released: 03/20/2005 Document Revised: 09/19/2011 Document Reviewed: 07/22/2013 Exit[employer] Patient Information 2015 [employer]. This information is not intended to replace advice given to you by your health care provider. Make sure you  discuss any questions you have with your health care provider.  

## 2014-04-29 NOTE — ED Notes (Addendum)
Pt reports falling down 2 concrete steps prior to arrival, admits to lower back pain where he hit his back on the stair- abrasion noted to lower back and tender to palpate.  Pt ambulatory prior to arrival, denies numbness or tingling to extremities- denies other injuries.

## 2014-04-29 NOTE — ED Provider Notes (Signed)
CSN: [id number]     Arrival date & time 04/29/14  1503 History   First MD Initiated Contact with Patient 04/29/14 1624     Chief Complaint  Patient presents with  . Fall     (Consider location/radiation/quality/duration/timing/severity/associated sxs/prior Treatment) HPI 69 year old male with past history as below presents to ED after ground level fall which occurred approximately 2 hours ago. Patient reports he slipped and while attempting to prevent himself from falling he grabbed a pole and then spun around and fell onto his buttock. Patient states as he was falling down his back hit a piece of concrete. Patient denies hitting his head, LOC, amnesia. He denies having injuries or pain elsewhere. Pain is rated 7/10. It is located in his lower midline of his back and radiates to his right hip. Patient states he has been ambulatory since falling. Modifying factors include ambulation and palpation worsens pain and rest improves the pain. Patient has not attempted any treatments. He denies having any other symptoms at this time. Past Medical History  Diagnosis Date  . CAD (coronary artery disease) CABG in 2004    a. 2004 s/p CABG  . GERD (gastroesophageal reflux disease)   . Hypertension   . Hyperlipidemia   . Renal insufficiency   . Tobacco abuse   . Seborrheic dermatitis of scalp   . Bradycardia   . AAA (abdominal aortic aneurysm)     a. 2010 s/p stenting  . Paroxysmal atrial fibrillation 02/22/2009    a. on Coumadin   . Carotid artery disease     a. 2012 dopplers with old LICA occlusion , RICA no significant abnormality  . Arthritis   . Prediabetes   . CVA (cerebral infarction)     a. 2013 R Carona radiata stroke    Past Surgical History  Procedure Laterality Date  . Coronary artery bypass graft  2004  . Rotator cuff repair    . Abdominal aortic aneurysm repair  2010    Aortic stent repair   Family History  Problem Relation Age of Onset  . Diabetes Mother   . Stroke Father    . Cancer Sister     thyroid cancer  . Heart disease Brother     Coronary artery disease   History  Substance Use Topics  . Smoking status: Former Smoker -- 0.50 packs/day for 35 years    Types: Cigarettes    Quit date: 02/03/2010  . Smokeless tobacco: Never Used  . Alcohol Use: No    Review of Systems  Constitutional: Negative for fever, activity change and appetite change.  HENT: Negative for congestion, rhinorrhea and sore throat.   Eyes: Negative for visual disturbance.  Respiratory: Negative for cough and shortness of breath.   Cardiovascular: Negative for chest pain, palpitations and leg swelling.  Gastrointestinal: Negative for nausea, vomiting, abdominal pain and diarrhea.  Genitourinary: Negative for dysuria, flank pain, decreased urine volume and difficulty urinating.  Musculoskeletal: Positive for back pain. Negative for neck pain.  Skin: Negative for rash.  Neurological: Negative for dizziness, syncope, speech difficulty, weakness, light-headedness, numbness and headaches.  Psychiatric/Behavioral: Negative for confusion.      Allergies  Diltiazem hcl and Oxycodone-acetaminophen  Home Medications   Prior to Admission medications   Medication Sig Start Date End Date Taking? Authorizing Provider  aspirin EC 81 MG tablet Take 81 mg by mouth every other day. After supper   Yes Historical Provider, MD  lisinopril (PRINIVIL,ZESTRIL) 10 MG tablet Take 10 mg by mouth daily  after supper.   Yes Historical Provider, MD  Multiple Vitamins-Minerals (MULTIVITAMIN WITH MINERALS) tablet Take 1 tablet by mouth daily after supper.    Yes Historical Provider, MD  simvastatin (ZOCOR) 40 MG tablet Take 1 tablet (40 mg total) by mouth daily after supper. 01/26/14  Yes Carlena Bjornstad, MD  vitamin B-12 (CYANOCOBALAMIN) 1000 MCG tablet Take 1,000 mcg by mouth daily after supper.   Yes Historical Provider, MD  warfarin (COUMADIN) 5 MG tablet Take 5 mg by mouth daily.   Yes Historical  Provider, MD  cetirizine (ZYRTEC) 10 MG tablet Take 10 mg by mouth daily after supper.    Historical Provider, MD  fluticasone (FLONASE) 50 MCG/ACT nasal spray Place 1 spray into both nostrils daily as needed (runny nose).    Historical Provider, MD  methocarbamol (ROBAXIN) 500 MG tablet Take 1 tablet (500 mg total) by mouth 2 (two) times daily. Patient not taking: Reported on 04/29/2014 08/26/13   Leota Jacobsen, MD  pregabalin (LYRICA) 50 MG capsule Take 1 capsule (50 mg total) by mouth 3 (three) times daily. Patient not taking: Reported on 03/10/2014 01/30/14 01/30/15  Jessee Avers, MD  warfarin (COUMADIN) 5 MG tablet TAKE AS DIRECTED BY  COUMADIN  CLINIC Patient not taking: Reported on 04/29/2014 04/27/14   Carlena Bjornstad, MD   BP 139/116 mmHg  Pulse 72  Temp(Src) 97.6 F (36.4 C)  Resp 18  SpO2 96% Physical Exam General: awake. AAOx3. WD, WN HENT:  [place]/AT and no palpable skull defect; pupils 4 mm, equal, round, reactive; EOMs intact. No signs of ocular entrapment, Battle sign, raccoon eyes, nasal septal hematoma, hemotympanum, midface instability or deformity, apparent oral injury Neck: supple, trachea midline, cervical collar in place, no midline C spine ttp Cardio: RRR.  No JVD.  2+ pulses in bilateral upper and lower extremities. No peripheral edema. Pulm:   CTAB, no r/r/g. Normal respiratory effort Chest wall: stable to AP/LAT compression, chest wall non-tender, no obvious clavicle deformity Abd: soft, NT/ND. MSK: Extremities atraumatic, FROM, NVI. Hips stable to lateral compression. Spine: without obvious step off. At L2-3 pt has small midline abrasion without step off. Mild TTP surrounding area.  Neuro: GCS 15. No focal deficit. Normal strength/sensation/muscle tone     ED Course  Procedures (including critical care time) Labs Review Labs Reviewed - No data to display  Imaging Review Dg Lumbar Spine Complete  04/29/2014   CLINICAL DATA:  [place] outside on a cinder block,  struck lower lumbar spine on this block, abrasion, mid to lower lumbar pain  EXAM: LUMBAR SPINE - COMPLETE 4+ VIEW  COMPARISON:  CT abdomen and pelvis 12/12/2013  FINDINGS: Aortoiliac stent identified traversing an abdominal aortic aneurysm.  Five non-rib-bearing lumbar vertebrae.  Diffuse osseous demineralization.  Scattered disc space narrowing and endplate spur formation.  Vertebral body heights maintained without fracture or subluxation.  Facet degenerative changes lower lumbar spine.  No spondylolysis.  SI joints grossly preserved.  IMPRESSION: Osseous demineralization with degenerative disc and facet disease changes lumbar spine.  No acute bony findings.  Prior stenting of an abdominal aortic aneurysm.   Electronically Signed   By: Lavonia Dana M.D.   On: 04/29/2014 17:51     EKG Interpretation None      MDM   Final diagnoses:  None    Patient presents after a ground-level fall which was mechanical in nature. Patient has abrasion to mid L-spine. No red flag signs of cauda equina. Patient has been ambulatory without significant  difficulty. Patient will be given analgesia and have x-ray of his L-spine. No indication for further imaging at this time. X-rays show no signs of fx. Pain much better with tylenol.  Clinical Impression: 1. Fall from standing, initial encounter   2. Abrasion   3. Midline low back pain without sciatica     Disposition: Discharge  Condition: Good  I have discussed the results, Dx and Tx plan with the pt(& family if present). He/she/they expressed understanding and agree(s) with the plan. Discharge instructions discussed at great length. Strict return precautions discussed and pt &/or family have verbalized understanding of the instructions. No further questions at time of discharge.    Discharge Medication List as of 04/29/2014  6:17 PM      Follow Up: Aldine Contes, MD [place], [place] [id number] [phone]   As  needed  [place] [address] [id number] [place] [id number] [phone]  If symptoms worsen   Pt seen in conjunction with Dr. Laretta Bolster, [place] [facility] Resident - PGY-2      Kirstie Peri, MD 04/30/14 5009  Ernestina Patches, MD 04/30/14 [phone]

## 2014-04-29 NOTE — ED Notes (Signed)
Acuity increased per [facility] [employer] provider

## 2014-05-15 ENCOUNTER — Ambulatory Visit (INDEPENDENT_AMBULATORY_CARE_PROVIDER_SITE_OTHER): Payer: Medicare HMO | Admitting: *Deleted

## 2014-05-15 DIAGNOSIS — I4891 Unspecified atrial fibrillation: Secondary | ICD-10-CM

## 2014-05-15 DIAGNOSIS — Z5181 Encounter for therapeutic drug level monitoring: Secondary | ICD-10-CM

## 2014-05-15 DIAGNOSIS — I639 Cerebral infarction, unspecified: Secondary | ICD-10-CM

## 2014-05-15 DIAGNOSIS — I480 Paroxysmal atrial fibrillation: Secondary | ICD-10-CM

## 2014-05-15 DIAGNOSIS — Z7901 Long term (current) use of anticoagulants: Secondary | ICD-10-CM

## 2014-05-15 LAB — POCT INR: INR: 2.1

## 2014-06-10 ENCOUNTER — Emergency Department (HOSPITAL_COMMUNITY)
Admission: EM | Admit: 2014-06-10 | Discharge: 2014-06-10 | Disposition: A | Discharge status: Home / Self Care | Payer: Medicare HMO | Attending: Internal Medicine | Admitting: Internal Medicine

## 2014-06-10 ENCOUNTER — Encounter (HOSPITAL_COMMUNITY): Payer: Self-pay | Admitting: *Deleted

## 2014-06-10 ENCOUNTER — Emergency Department (HOSPITAL_COMMUNITY): Payer: Medicare HMO

## 2014-06-10 DIAGNOSIS — J069 Acute upper respiratory infection, unspecified: Secondary | ICD-10-CM | POA: Diagnosis not present

## 2014-06-10 DIAGNOSIS — I2510 Atherosclerotic heart disease of native coronary artery without angina pectoris: Secondary | ICD-10-CM | POA: Diagnosis not present

## 2014-06-10 DIAGNOSIS — Z7901 Long term (current) use of anticoagulants: Secondary | ICD-10-CM | POA: Insufficient documentation

## 2014-06-10 DIAGNOSIS — R05 Cough: Secondary | ICD-10-CM | POA: Diagnosis present

## 2014-06-10 DIAGNOSIS — Z872 Personal history of diseases of the skin and subcutaneous tissue: Secondary | ICD-10-CM | POA: Insufficient documentation

## 2014-06-10 DIAGNOSIS — Z7982 Long term (current) use of aspirin: Secondary | ICD-10-CM | POA: Insufficient documentation

## 2014-06-10 DIAGNOSIS — J209 Acute bronchitis, unspecified: Secondary | ICD-10-CM | POA: Diagnosis not present

## 2014-06-10 DIAGNOSIS — Z87891 Personal history of nicotine dependence: Secondary | ICD-10-CM | POA: Diagnosis not present

## 2014-06-10 DIAGNOSIS — I10 Essential (primary) hypertension: Secondary | ICD-10-CM | POA: Diagnosis not present

## 2014-06-10 DIAGNOSIS — Z951 Presence of aortocoronary bypass graft: Secondary | ICD-10-CM | POA: Diagnosis not present

## 2014-06-10 DIAGNOSIS — Z87448 Personal history of other diseases of urinary system: Secondary | ICD-10-CM | POA: Diagnosis not present

## 2014-06-10 DIAGNOSIS — Z7951 Long term (current) use of inhaled steroids: Secondary | ICD-10-CM | POA: Insufficient documentation

## 2014-06-10 DIAGNOSIS — Z8673 Personal history of transient ischemic attack (TIA), and cerebral infarction without residual deficits: Secondary | ICD-10-CM | POA: Insufficient documentation

## 2014-06-10 DIAGNOSIS — Z8719 Personal history of other diseases of the digestive system: Secondary | ICD-10-CM | POA: Insufficient documentation

## 2014-06-10 DIAGNOSIS — I639 Cerebral infarction, unspecified: Secondary | ICD-10-CM | POA: Diagnosis present

## 2014-06-10 DIAGNOSIS — E785 Hyperlipidemia, unspecified: Secondary | ICD-10-CM | POA: Insufficient documentation

## 2014-06-10 DIAGNOSIS — N183 Chronic kidney disease, stage 3 unspecified: Secondary | ICD-10-CM | POA: Diagnosis present

## 2014-06-10 DIAGNOSIS — J40 Bronchitis, not specified as acute or chronic: Secondary | ICD-10-CM

## 2014-06-10 DIAGNOSIS — R079 Chest pain, unspecified: Secondary | ICD-10-CM

## 2014-06-10 DIAGNOSIS — M1990 Unspecified osteoarthritis, unspecified site: Secondary | ICD-10-CM | POA: Diagnosis not present

## 2014-06-10 DIAGNOSIS — I480 Paroxysmal atrial fibrillation: Secondary | ICD-10-CM

## 2014-06-10 LAB — CBC
HCT: 44.6 % (ref 39.0–52.0)
Hemoglobin: 14.9 g/dL (ref 13.0–17.0)
MCH: 29.7 pg (ref 26.0–34.0)
MCHC: 33.4 g/dL (ref 30.0–36.0)
MCV: 88.8 fL (ref 78.0–100.0)
Platelets: 160 10*3/uL (ref 150–400)
RBC: 5.02 MIL/uL (ref 4.22–5.81)
RDW: 13.6 % (ref 11.5–15.5)
WBC: 6.5 10*3/uL (ref 4.0–10.5)

## 2014-06-10 LAB — BASIC METABOLIC PANEL
ANION GAP: 11 (ref 5–15)
BUN: 26 mg/dL — ABNORMAL HIGH (ref 6–23)
CO2: 25 mmol/L (ref 19–32)
CREATININE: 1.9 mg/dL — AB (ref 0.50–1.35)
Calcium: 9.2 mg/dL (ref 8.4–10.5)
Chloride: 102 mmol/L (ref 96–112)
GFR calc Af Amer: 39 mL/min — ABNORMAL LOW (ref >90)
GFR calc non Af Amer: 34 mL/min — ABNORMAL LOW (ref >90)
GLUCOSE: 120 mg/dL — AB (ref 70–99)
POTASSIUM: 4.6 mmol/L (ref 3.5–5.1)
Sodium: 138 mmol/L (ref 135–145)

## 2014-06-10 LAB — I-STAT TROPONIN, ED: Troponin i, poc: 0.01 ng/mL (ref 0.00–0.08)

## 2014-06-10 LAB — BRAIN NATRIURETIC PEPTIDE: B Natriuretic Peptide: 24.5 pg/mL (ref 0.0–100.0)

## 2014-06-10 MED ORDER — DOXYCYCLINE HYCLATE 100 MG PO CAPS: 100.0000 mg | ORAL_CAPSULE | Freq: Two times a day (BID) | ORAL | Status: DC

## 2014-06-10 MED ORDER — ALBUTEROL SULFATE (2.5 MG/3ML) 0.083% IN NEBU
5.0000 mg | INHALATION_SOLUTION | Freq: Once | RESPIRATORY_TRACT | Status: DC
  Filled 2014-06-10: qty 6

## 2014-06-10 MED ORDER — ALBUTEROL SULFATE (2.5 MG/3ML) 0.083% IN NEBU
2.5000 mg | INHALATION_SOLUTION | Freq: Once | RESPIRATORY_TRACT | Status: AC
  Administered 2014-06-10: 2.5 mg via RESPIRATORY_TRACT
  Filled 2014-06-10: qty 3

## 2014-06-10 MED ORDER — SODIUM CHLORIDE 0.9 % IV BOLUS (SEPSIS)
500.0000 mL | Freq: Once | INTRAVENOUS | Status: AC
  Administered 2014-06-10: 500 mL via INTRAVENOUS

## 2014-06-10 MED ORDER — SODIUM CHLORIDE 0.9 % IV SOLN: 10.0000 mL/h | Freq: Once | INTRAVENOUS | Status: DC

## 2014-06-10 NOTE — ED Notes (Signed)
Pt reports recent productive cough with yellow sputum, having chest pain and pain to entire body. Having sob, spo2 100%. Denies swelling to legs.

## 2014-06-10 NOTE — ED Provider Notes (Signed)
CSN: [id number]     Arrival date & time 06/10/14  1037 History   First MD Initiated Contact with Patient 06/10/14 1121     Chief Complaint  Patient presents with  . Cough  . Shortness of Breath     (Consider location/radiation/quality/duration/timing/severity/associated sxs/prior Treatment) Patient is a 69 y.o. male presenting with cough and shortness of breath. The history is provided by the patient. No language interpreter was used.  Cough Associated symptoms: chest pain, chills and shortness of breath   Associated symptoms: no fever, no headaches and no sore throat   Shortness of Breath Associated symptoms: chest pain and cough   Associated symptoms: no abdominal pain, no fever, no headaches, no neck pain, no sore throat and no vomiting   Dylan Morgan is a 69 y/o M with PMHx of CAD with CABG in 2004, GERD, HTN, renal insufficiency, tobacco abuse, bradycardia, AAA with repair 2010, Afib on coumadin, CVA presenting to the ED with chest pain, cough, shortness of breath. Patient reported that for the past 2 days he started to have a head cold - reported that he has been dealing with nasal congestion. Reported that he has been having a cough with productive sputum - stated that when he coughs he is coughing up a thick green tinged phlegm. Reported that he has been having intermittent chills. Reported that he has been using Advil with minimal relief. Reported that he has been experiencing chest pain localized to the entire chest and runs across his chest that started yesterday - reported to be intermittent. Reported that the pain is a pressure sensation without radiation. Denied sore throat, difficulty swallowing, fever, neck pain, neck stiffness, back pain, abdominal pain, nausea, vomiting, diarrhea, melena, hematochezia, weakness, fall, changes to appetite, difficulty breathing, travels, leg swelling, hemoptysis, sick contacts, denied auction therapy at home. PCP Dr. Dareen Piano Cardiologist Dr.  Ron Parker  Past Medical History  Diagnosis Date  . CAD (coronary artery disease) CABG in 2004    a. 2004 s/p CABG  . GERD (gastroesophageal reflux disease)   . Hypertension   . Hyperlipidemia   . Renal insufficiency   . Tobacco abuse   . Seborrheic dermatitis of scalp   . Bradycardia   . AAA (abdominal aortic aneurysm)     a. 2010 s/p stenting  . Paroxysmal atrial fibrillation 02/22/2009    a. on Coumadin   . Carotid artery disease     a. 2012 dopplers with old LICA occlusion , RICA no significant abnormality  . Arthritis   . Prediabetes   . CVA (cerebral infarction)     a. 2013 R Carona radiata stroke    Past Surgical History  Procedure Laterality Date  . Coronary artery bypass graft  2004  . Rotator cuff repair    . Abdominal aortic aneurysm repair  2010    Aortic stent repair   Family History  Problem Relation Age of Onset  . Diabetes Mother   . Stroke Father   . Cancer Sister     thyroid cancer  . Heart disease Brother     Coronary artery disease   History  Substance Use Topics  . Smoking status: Former Smoker -- 0.50 packs/day for 35 years    Types: Cigarettes    Quit date: 02/03/2010  . Smokeless tobacco: Never Used  . Alcohol Use: No    Review of Systems  Constitutional: Positive for chills. Negative for fever.  HENT: Positive for congestion. Negative for sore throat and trouble swallowing.  Eyes: Negative for visual disturbance.  Respiratory: Positive for cough and shortness of breath.   Cardiovascular: Positive for chest pain.  Gastrointestinal: Negative for nausea, vomiting, abdominal pain, diarrhea, constipation, blood in stool and anal bleeding.  Musculoskeletal: Negative for back pain, neck pain and neck stiffness.  Neurological: Negative for dizziness, weakness, numbness and headaches.      Allergies  Diltiazem hcl and Oxycodone-acetaminophen  Home Medications   Prior to Admission medications   Medication Sig Start Date End Date Taking?  Authorizing Provider  aspirin EC 81 MG tablet Take 81 mg by mouth every other day. After supper   Yes Historical Provider, MD  fluticasone (FLONASE) 50 MCG/ACT nasal spray Place 1 spray into both nostrils daily as needed (runny nose).   Yes Historical Provider, MD  lisinopril (PRINIVIL,ZESTRIL) 10 MG tablet Take 10 mg by mouth daily after supper.   Yes Historical Provider, MD  Multiple Vitamins-Minerals (MULTIVITAMIN WITH MINERALS) tablet Take 1 tablet by mouth daily after supper.    Yes Historical Provider, MD  simvastatin (ZOCOR) 40 MG tablet Take 1 tablet (40 mg total) by mouth daily after supper. 01/26/14  Yes Carlena Bjornstad, MD  vitamin B-12 (CYANOCOBALAMIN) 1000 MCG tablet Take 1,000 mcg by mouth daily after supper.   Yes Historical Provider, MD  warfarin (COUMADIN) 5 MG tablet Take 5 mg by mouth daily.   Yes Historical Provider, MD  doxycycline (VIBRAMYCIN) 100 MG capsule Take 1 capsule (100 mg total) by mouth 2 (two) times daily. One po bid x 7 days 06/10/14   Bazil Dhanani, PA-C  methocarbamol (ROBAXIN) 500 MG tablet Take 1 tablet (500 mg total) by mouth 2 (two) times daily. Patient not taking: Reported on 04/29/2014 08/26/13   Lacretia Leigh, MD  pregabalin (LYRICA) 50 MG capsule Take 1 capsule (50 mg total) by mouth 3 (three) times daily. Patient not taking: Reported on 03/10/2014 01/30/14 01/30/15  Jessee Avers, MD  warfarin (COUMADIN) 5 MG tablet TAKE AS DIRECTED BY  COUMADIN  CLINIC Patient not taking: Reported on 04/29/2014 04/27/14   Carlena Bjornstad, MD   BP 123/86 mmHg  Pulse 83  Temp(Src) 98.8 F (37.1 C) (Oral)  Resp 24  SpO2 94% Physical Exam  Constitutional: He is oriented to person, place, and time. He appears well-developed and well-nourished. No distress.  HENT:  Head: Normocephalic and atraumatic.  Mouth/Throat: Oropharynx is clear and moist. No oropharyngeal exudate.  Eyes: Conjunctivae and EOM are normal. Pupils are equal, round, and reactive to light. Right eye  exhibits no discharge. Left eye exhibits no discharge.  Neck: Normal range of motion. Neck supple. No tracheal deviation present.  Cardiovascular: Normal rate, regular rhythm and normal heart sounds.  Exam reveals no friction rub.   No murmur heard. Pulses:      Radial pulses are 2+ on the right side, and 2+ on the left side.       Dorsalis pedis pulses are 2+ on the right side, and 2+ on the left side.  Cap refill < 3 seconds Negative swelling or pitting edema noted to the lower extremities bilaterally   Pulmonary/Chest: Effort normal. No respiratory distress. He has wheezes. He has no rales. He exhibits no tenderness.  Musculoskeletal: Normal range of motion.  Lymphadenopathy:    He has no cervical adenopathy.  Neurological: He is alert and oriented to person, place, and time. No cranial nerve deficit. He exhibits normal muscle tone. Coordination normal.  Skin: Skin is warm and dry. No rash noted. He  is not diaphoretic. No erythema.  Psychiatric: He has a normal mood and affect. His behavior is normal. Thought content normal.  Nursing note and vitals reviewed.   ED Course  Procedures (including critical care time)  Results for orders placed or performed during the hospital encounter of 06/10/14  CBC  Result Value Ref Range   WBC 6.5 4.0 - 10.5 K/uL   RBC 5.02 4.22 - 5.81 MIL/uL   Hemoglobin 14.9 13.0 - 17.0 g/dL   HCT 44.6 39.0 - 52.0 %   MCV 88.8 78.0 - 100.0 fL   MCH 29.7 26.0 - 34.0 pg   MCHC 33.4 30.0 - 36.0 g/dL   RDW 13.6 11.5 - 15.5 %   Platelets 160 150 - 400 K/uL  Basic metabolic panel  Result Value Ref Range   Sodium 138 135 - 145 mmol/L   Potassium 4.6 3.5 - 5.1 mmol/L   Chloride 102 96 - 112 mmol/L   CO2 25 19 - 32 mmol/L   Glucose, Bld 120 (H) 70 - 99 mg/dL   BUN 26 (H) 6 - 23 mg/dL   Creatinine, Ser 1.90 (H) 0.50 - 1.35 mg/dL   Calcium 9.2 8.4 - 10.5 mg/dL   GFR calc non Af Amer 34 (L) >90 mL/min   GFR calc Af Amer 39 (L) >90 mL/min   Anion gap 11 5 - 15   BNP (order ONLY if patient complains of dyspnea/SOB AND you have documented it for THIS visit)  Result Value Ref Range   B Natriuretic Peptide 24.5 0.0 - 100.0 pg/mL  I-stat troponin, ED (not at [facility])  Result Value Ref Range   Troponin i, poc 0.01 0.00 - 0.08 ng/mL   Comment 3            Labs Review Labs Reviewed  BASIC METABOLIC PANEL - Abnormal; Notable for the following:    Glucose, Bld 120 (*)    BUN 26 (*)    Creatinine, Ser 1.90 (*)    GFR calc non Af Amer 34 (*)    GFR calc Af Amer 39 (*)    All other components within normal limits  CBC  BRAIN NATRIURETIC PEPTIDE  I-STAT TROPOININ, ED    Imaging Review Dg Chest 2 View  06/10/2014   CLINICAL DATA:  Shortness of breath and chest pain for 3 days, initial encounter.  EXAM: CHEST  2 VIEW  COMPARISON:  08/26/2013.  FINDINGS: Trachea is midline. Heart size stable. Descending thoracic aorta is tortuous. Lungs are clear. No pleural fluid.  IMPRESSION: No acute findings.   Electronically Signed   By: Lorin Picket M.D.   On: 06/10/2014 11:28     EKG Interpretation   Date/Time:  Wednesday June 10 2014 10:42:37 EST Ventricular Rate:  97 PR Interval:  140 QRS Duration: 86 QT Interval:  338 QTC Calculation: 429 R Axis:   -77 Text Interpretation:  Sinus rhythm with occasional Premature ventricular  complexes Low voltage QRS Left anterior fascicular block Possible Lateral  infarct , age undetermined Abnormal ECG No significant change since last  tracing Confirmed by Kathrynn Humble, MD, Thelma Comp [id number]) on 06/10/2014 3:37:32 PM       1:39 PM This provider spoke with Trish, Cards. Discussed case. Cardiology to consult patient.   2:38 PM Cardiology PA-C at bedside.  3:10 PM As per cardiology Pa-C, Lorretta Harp. Discussed with Cardiology - reported that patient can be discharged home. Appointment has been scheduled later this week for patient to be seen and assessed.  MDM   Final diagnoses:  URI (upper respiratory infection)   Bronchitis    Medications  sodium chloride 0.9 % bolus 500 mL (0 mLs Intravenous Stopped 06/10/14 1415)  albuterol (PROVENTIL) (2.5 MG/3ML) 0.083% nebulizer solution 2.5 mg (2.5 mg Nebulization Given 06/10/14 1434)     Filed Vitals:   06/10/14 1430 06/10/14 1500 06/10/14 1515 06/10/14 1523  BP: 109/71 123/86  123/86  Pulse: 82 90 85 83  Temp:    98.8 F (37.1 C)  TempSrc:    Oral  Resp: 25 19 17 24   SpO2: 91% 95% 97% 94%   EKG noted sinus rhythm with a heart rate of 97 bpm with premature ventricular complexes, left anterior fascicular block noted. I-STAT troponin negative elevation. CBC negative elevated leukocytosis. Hemoglobin 14.9, hematocrit 44.6. BMP noted mildly elevated BUN 26, creatinine 1.90. BNP negative elevation. Chest x-ray no acute findings-negative findings of pneumonia. Patient seen and assessed by attending physician. Patient seen and assessed by cardiology. As per cardiology reported that patient can be discharged home - appointment scheduled as an outpatient. Possible stable angina. Possible bronchitis. Negative findings of pneumonia. Negative findings of fluid overload. As per instructions from attending physician recommended doxycyline for patient with history of cardiac issues and for patient to be covered for respiratory infection. Patient stable, afebrile. Patient not septic appearing. Negative signs of respiratory distress. Discharged patient. Referred patient to Cardiology and to keep appointment. Referred to PCP. Discussed with patient to closely monitor symptoms and if symptoms are to worsen or change to report back to the ED - strict return instructions given.  Patient agreed to plan of care, understood, all questions answered.   Jamse Mead, PA-C 06/10/14 [place], MD 06/12/14 [phone]

## 2014-06-10 NOTE — ED Notes (Signed)
Pt eating snack at bedside. No signs of distress noted.

## 2014-06-10 NOTE — Discharge Instructions (Signed)
Please call your doctor for a followup appointment within 24-48 hours. When you talk to your doctor please let them know that you were seen in the emergency department and have them acquire all of your records so that they can discuss the findings with you and formulate a treatment plan to fully care for your new and ongoing problems. Please keep appointment with cardiology Please follow-up with primary care provider Please rest and stay hydrated Please avoid any physical strenuous activity Take medication as prescribed on a full stomach Please continue to monitor symptoms closely and if symptoms are to worsen or change (fever greater than 101, chills, sweating, nausea, vomiting, chest pain, shortness of breathe, difficulty breathing, weakness, numbness, tingling, worsening or changes to pain pattern, coughing up blood) please report back to the Emergency Department immediately.   Acute Bronchitis Bronchitis is inflammation of the airways that extend from the windpipe into the lungs (bronchi). The inflammation often causes mucus to develop. This leads to a cough, which is the most common symptom of bronchitis.  In acute bronchitis, the condition usually develops suddenly and goes away over time, usually in a couple weeks. Smoking, allergies, and asthma can make bronchitis worse. Repeated episodes of bronchitis may cause further lung problems.  CAUSES Acute bronchitis is most often caused by the same virus that causes a cold. The virus can spread from person to person (contagious) through coughing, sneezing, and touching contaminated objects. SIGNS AND SYMPTOMS   Cough.   Fever.   Coughing up mucus.   Body aches.   Chest congestion.   Chills.   Shortness of breath.   Sore throat.  DIAGNOSIS  Acute bronchitis is usually diagnosed through a physical exam. Your health care provider will also ask you questions about your medical history. Tests, such as chest X-rays, are sometimes  done to rule out other conditions.  TREATMENT  Acute bronchitis usually goes away in a couple weeks. Oftentimes, no medical treatment is necessary. Medicines are sometimes given for relief of fever or cough. Antibiotic medicines are usually not needed but may be prescribed in certain situations. In some cases, an inhaler may be recommended to help reduce shortness of breath and control the cough. A cool mist vaporizer may also be used to help thin bronchial secretions and make it easier to clear the chest.  HOME CARE INSTRUCTIONS  Get plenty of rest.   Drink enough fluids to keep your urine clear or pale yellow (unless you have a medical condition that requires fluid restriction). Increasing fluids may help thin your respiratory secretions (sputum) and reduce chest congestion, and it will prevent dehydration.   Take medicines only as directed by your health care provider.  If you were prescribed an antibiotic medicine, finish it all even if you start to feel better.  Avoid smoking and secondhand smoke. Exposure to cigarette smoke or irritating chemicals will make bronchitis worse. If you are a smoker, consider using nicotine gum or skin patches to help control withdrawal symptoms. Quitting smoking will help your lungs heal faster.   Reduce the chances of another bout of acute bronchitis by washing your hands frequently, avoiding people with cold symptoms, and trying not to touch your hands to your mouth, nose, or eyes.   Keep all follow-up visits as directed by your health care provider.  SEEK MEDICAL CARE IF: Your symptoms do not improve after 1 week of treatment.  SEEK IMMEDIATE MEDICAL CARE IF:  You develop an increased fever or chills.   You  have chest pain.   You have severe shortness of breath.  You have bloody sputum.   You develop dehydration.  You faint or repeatedly feel like you are going to pass out.  You develop repeated vomiting.  You develop a severe  headache. MAKE SURE YOU:   Understand these instructions.  Will watch your condition.  Will get help right away if you are not doing well or get worse. Document Released: 04/27/2004 Document Revised: 08/04/2013 Document Reviewed: 09/10/2012 [facility] Patient Information 2015 [place], [place]. This information is not intended to replace advice given to you by your health care provider. Make sure you discuss any questions you have with your health care provider.

## 2014-06-10 NOTE — Consult Note (Signed)
CARDIOLOGY CONSULT NOTE   Patient ID: BOBBI KOZAKIEWICZ MRN: [id number] DOB/AGE: 71-31-1947 71 y.o.  Admit date: 06/10/2014  Primary Physician   Aldine Contes, MD Primary Cardiologist   Dr. Ron Parker  Reason for Consultation   Chest pain   HPI: ULICES MAACK is a 69 y.o. male with a history of CAD s/p CABG (2004), HTN, HLD, CKD, bradycardia, previous tobacco abuse, AAA s/p stenting, PAF on coumadin, carotid artery disease, previous CVA, OSA and GERD who presented to [facility] today with cough, SOB and some CP.  He was in his usual state of health until 3 days ago when he developed a productive cough. He has some yellowish sputum. No fevers or chills. He also noted some mild central chest pain while laying on his belly fixing his son's well. It was self limited and mild. It radiated to his back and then dissapeared. He has some mild SOB but no nausea or diaphoresis. He has some minor chest discomfort this AM and some exertional SOB this AM. He told his niece who is a CMA about his symptoms who told him he should be see in the ED. He has been given a breathing treatment in the ED with complete resolution of is SOB. He is requesting to go home. In the ED his troponin POC is negative. BNP normal. ECG with no acute ST or TW changes. CXR clear. He is afebrile and has no white count. His INR is therapeutic at 2.1. Of note, he had a normal nuclear stress test in    Past Medical History  Diagnosis Date  . CAD (coronary artery disease) CABG in 2004    a. 2004 s/p CABG  . GERD (gastroesophageal reflux disease)   . Hypertension   . Hyperlipidemia   . Renal insufficiency   . Tobacco abuse   . Seborrheic dermatitis of scalp   . Bradycardia   . AAA (abdominal aortic aneurysm)     a. 2010 s/p stenting  . Paroxysmal atrial fibrillation 02/22/2009    a. on Coumadin   . Carotid artery disease     a. 2012 dopplers with old LICA occlusion , RICA no significant abnormality  . Arthritis   . Prediabetes     . CVA (cerebral infarction)     a. 2013 R Carona radiata stroke      Past Surgical History  Procedure Laterality Date  . Coronary artery bypass graft  2004  . Rotator cuff repair    . Abdominal aortic aneurysm repair  2010    Aortic stent repair    Allergies  Allergen Reactions  . Diltiazem Hcl Itching  . Oxycodone-Acetaminophen Other (See Comments)    vertigo    I have reviewed the patient's current medications . albuterol  2.5 mg Nebulization Once   . sodium chloride       Prior to Admission medications   Medication Sig Start Date End Date Taking? Authorizing Provider  aspirin EC 81 MG tablet Take 81 mg by mouth every other day. After supper   Yes Historical Provider, MD  fluticasone (FLONASE) 50 MCG/ACT nasal spray Place 1 spray into both nostrils daily as needed (runny nose).   Yes Historical Provider, MD  lisinopril (PRINIVIL,ZESTRIL) 10 MG tablet Take 10 mg by mouth daily after supper.   Yes Historical Provider, MD  Multiple Vitamins-Minerals (MULTIVITAMIN WITH MINERALS) tablet Take 1 tablet by mouth daily after supper.    Yes Historical Provider, MD  simvastatin (ZOCOR) 40 MG  tablet Take 1 tablet (40 mg total) by mouth daily after supper. 01/26/14  Yes Carlena Bjornstad, MD  vitamin B-12 (CYANOCOBALAMIN) 1000 MCG tablet Take 1,000 mcg by mouth daily after supper.   Yes Historical Provider, MD  warfarin (COUMADIN) 5 MG tablet Take 5 mg by mouth daily.   Yes Historical Provider, MD  methocarbamol (ROBAXIN) 500 MG tablet Take 1 tablet (500 mg total) by mouth 2 (two) times daily. Patient not taking: Reported on 04/29/2014 08/26/13   Lacretia Leigh, MD  pregabalin (LYRICA) 50 MG capsule Take 1 capsule (50 mg total) by mouth 3 (three) times daily. Patient not taking: Reported on 03/10/2014 01/30/14 01/30/15  Jessee Avers, MD  warfarin (COUMADIN) 5 MG tablet TAKE AS DIRECTED BY  COUMADIN  CLINIC Patient not taking: Reported on 04/29/2014 04/27/14   Carlena Bjornstad, MD      History   Social History  . Marital Status: Single    Spouse Name: N/A  . Number of Children: N/A  . Years of Education: N/A   Occupational History  . disabled    Social History Main Topics  . Smoking status: Former Smoker -- 0.50 packs/day for 35 years    Types: Cigarettes    Quit date: 02/03/2010  . Smokeless tobacco: Never Used  . Alcohol Use: No  . Drug Use: No  . Sexual Activity: Not on file   Other Topics Concern  . Not on file   Social History Narrative   Lives with son and his significant other, denies being married. Has 3 daughters & a son.  Several grandchildren.    Divorced.   Regular exercise.   Prior to retirement, hung dry wall for a living.     Family Status  Relation Status Death Age  . Mother Deceased   . Father Deceased    Family History  Problem Relation Age of Onset  . Diabetes Mother   . Stroke Father   . Cancer Sister     thyroid cancer  . Heart disease Brother     Coronary artery disease     ROS:  Full 14 point review of systems complete and found to be negative unless listed above.  Physical Exam: Blood pressure 121/45, pulse 80, temperature 99.8 F (37.7 C), temperature source Oral, resp. rate 22, SpO2 95 %.  General: Well developed, well nourished, male in no acute distress Head: Eyes PERRLA, No xanthomas.   Normocephalic and atraumatic, oropharynx without edema or exudate. :  Lungs: CTAB Heart: HRRR S1 S2, no rub/gallop, Heart irregular rate and rhythm with S1, S2  murmur. pulses are 2+ extrem.   Neck: No carotid bruits. No lymphadenopathy. No  JVD. Abdomen: Bowel sounds present, abdomen soft and non-tender without masses or hernias noted. Msk:  No spine or cva tenderness. No weakness, no joint deformities or effusions. Extremities: No clubbing or cyanosis. No  edema.  Neuro: Alert and oriented X 3. No focal deficits noted. Psych:  Good affect, responds appropriately Skin: No rashes or lesions noted.  Labs:   Lab Results   Component Value Date   WBC 6.5 06/10/2014   HGB 14.9 06/10/2014   HCT 44.6 06/10/2014   MCV 88.8 06/10/2014   PLT 160 06/10/2014   No results for input(s): INR in the last 72 hours.  Recent Labs Lab 06/10/14 1056  NA 138  K 4.6  CL 102  CO2 25  BUN 26*  CREATININE 1.90*  CALCIUM 9.2  GLUCOSE 120*  Recent Labs  06/10/14 1119  TROPIPOC 0.01     Radiology:  Dg Chest 2 View  06/10/2014   CLINICAL DATA:  Shortness of breath and chest pain for 3 days, initial encounter.  EXAM: CHEST  2 VIEW  COMPARISON:  08/26/2013.  FINDINGS: Trachea is midline. Heart size stable. Descending thoracic aorta is tortuous. Lungs are clear. No pleural fluid.  IMPRESSION: No acute findings.   Electronically Signed   By: Lorin Picket M.D.   On: 06/10/2014 11:28    ASSESSMENT AND PLAN:    Principal Problem:   Chest pain Active Problems:   CKD (chronic kidney disease), stage III   CAD (coronary artery disease)   Hypertension   Paroxysmal atrial fibrillation   CVA (cerebral infarction)   EDIBERTO SENS is a 69 y.o. male with a history of CAD s/p CABG (2004), HTN, HLD, CKD, bradycardia, previous tobacco abuse, AAA s/p stenting, PAF on coumadin, carotid artery disease, previous CVA, OSA and GERD who presented to [facility] today with cough, SOB and some CP.  Chest pain/SOB- now completely resolved. He has been given a breathing treatment in the ED with complete resolution of is SOB. No further CP. He is requesting to go home. In the ED his troponin POC is negative. BNP normal. ECG with no acute ST or TW changes. CXR clear. He is afebrile and has no white count. He had a negative nuclear stress test in 08/2013.   PAF- maintaining NSR. INR therapeutic at 2.1  HTN- BP well controlled. Continue current regimen  Dispo- The patient is currently asymptomatic with no objective signs of ischemia. No evidence of CHF or PNA.  Dr. Gwenlyn Found and I feel that the patient can be safely discharged home with close  outpatient follow up. I have arranged this with Richardson Dopp PA-C in on 06/22/14   Signed: Eileen Stanford, PA-C 06/10/2014 2:24 PM  Pager [phone]  Co-Sign MD  Agree with note by Nell Range PA-C  Pt with known CAD, diffuse vascular disease, PAF maintaining NSR we are asked to see for atypical CP. He apparently was working in a well with his son on his abdomen, with some atyp CP today and SOB which has resolved with a breathing Rx. Enz neg. EKG w/o acute changes. CXR OK. Doubt cardiac CP. OK for D/C home from the ER with close OP follow up     Lorretta Harp, M.D., [place], [facility], [place], [place] [address] [place], [place]  [id number]  [phone] 06/10/2014 3:40 PM

## 2014-06-18 ENCOUNTER — Telehealth: Payer: Self-pay | Admitting: Internal Medicine

## 2014-06-18 ENCOUNTER — Other Ambulatory Visit: Payer: Self-pay | Admitting: Internal Medicine

## 2014-06-18 NOTE — Telephone Encounter (Signed)
Call to patient to confirm appointment for 06/19/14 at 3:15 lmtcb

## 2014-06-19 ENCOUNTER — Ambulatory Visit (INDEPENDENT_AMBULATORY_CARE_PROVIDER_SITE_OTHER): Payer: Medicare HMO | Admitting: Internal Medicine

## 2014-06-19 VITALS — BP 148/83 | HR 71 | Temp 97.9°F | Wt 228.0 lb

## 2014-06-19 DIAGNOSIS — J069 Acute upper respiratory infection, unspecified: Secondary | ICD-10-CM | POA: Insufficient documentation

## 2014-06-19 MED ORDER — GUAIFENESIN-DM 100-10 MG/5ML PO SYRP: 5.0000 mL | ORAL_SOLUTION | Freq: Four times a day (QID) | ORAL | Status: DC | PRN

## 2014-06-19 NOTE — Patient Instructions (Signed)
It was a pleasure to see you today. Please take the robitussin-dm liquid 5 mL every six hours as needed for your cough. Please go to your cardiology appointment on 3/21. Please return to clinic or seek medical attention if you have any new or worsening trouble breathing, cough, fever, or other worrisome medical condition. We look forward to seeing you again as needed.  Lottie Mussel, MD  General Instructions:   Please try to bring all your medicines next time. This will help [place] keep you safe from mistakes.   Progress Toward Treatment Goals:  Treatment Goal 02/13/2014  Blood pressure at goal    Self Care Goals & Plans:  Self Care Goal 02/13/2014  Manage my medications take my medicines as prescribed; bring my medications to every visit; refill my medications on time  Monitor my health -  Eat healthy foods drink diet soda or water instead of juice or soda; eat more vegetables; eat foods that are low in salt; eat baked foods instead of fried foods  Be physically active -    No flowsheet data found.   Care Management & Community Referrals:  Referral 01/10/2013  Referrals made for care management support none needed

## 2014-06-19 NOTE — Assessment & Plan Note (Signed)
Dylan Morgan likely has a URI that is resolving. His symptoms have improved since he was seen in the ED a week ago. At that time his CXR was clear making pneumonia unlikely. He has been afebrile and subjective chills have resolved. He never filled his doxycycline prescription due to concerns about warfarin interaction so this was likely viral if it is resolving on his own. Will give robitussin-dm for symptom management. He wants an albuterol nebulizer at home but I told him he has no history of documented asthma, COPD, or PFTs (he did smoke but quit a few years ago) so we will see if his symptoms continue to improve before giving inhaler -robitussin-dm 5 mL q6hprn -patient instructed to RTC if no improvement in one week -reminded to attend cardiology appointment 3/21

## 2014-06-19 NOTE — Progress Notes (Signed)
   Subjective:    Patient ID: Dylan Morgan, male    DOB: 06-29-1945, 69 y.o.   MRN: [id number]  HPI  Mr Janowiak is a 69 year old man with CAD s/p CABG 2004, HTN, paroxysmal atrial fibrillation, AAA s/p stent 2010, CVA 2013, CKD3, pre-diabetes here for ED follow-up.  He was seen in the ED 3/9 for chest pain with a cough productive of green sputum as well as congestion for two days and intermittent chills. He described the chet pain as across his whole chest but non-radiating, intermittent, and a pressure sensation. Cardiology was called and they scheduled out-patient follow-up 3/21 as he had an unchanged EKG, negative CXR, negative troponin, and no leukocytosis. This was felt to be stable angina versus bronchitis. He was discharged with 7 days of doxycycline.  Since he left, he did not take any of the doxycycline because he was concerned it would make him bleed when interacting with the warfarin. He says that he is feeling better. He ascribes this to eating pineapple and taking a shot of white liquor. His cough has improved some. He says that sputum used to be green but is now white. It is less often in frequency. He also has congestion. His sore throat has resolved. He denies any fevers, chills, night sweats. He only has some chest soreness when he is coughing. I reminded him to go to his cardiology appointment 3/21 and he said he would.  Review of Systems  Constitutional: Negative for fever, chills and diaphoresis.  HENT: Positive for congestion. Negative for ear pain, postnasal drip, rhinorrhea, sinus pressure and sore throat.   Respiratory: Positive for cough. Negative for shortness of breath and wheezing.   Cardiovascular: Negative for chest pain.  Neurological: Negative for dizziness, weakness, light-headedness and numbness.       Objective:   Physical Exam  Constitutional: He appears well-developed and well-nourished. No distress.  HENT:  Head: Normocephalic and atraumatic.    Mouth/Throat: Oropharynx is clear and moist.  No frontal or maxillary sinus tenderness  Eyes: EOM are normal. Pupils are equal, round, and reactive to light.  Cardiovascular: Normal rate, regular rhythm, normal heart sounds and intact distal pulses.  Exam reveals no gallop and no friction rub.   No murmur heard. Pulmonary/Chest: Effort normal and breath sounds normal. No respiratory distress. He has no wheezes.  Abdominal: Soft. Bowel sounds are normal. He exhibits no distension. There is no tenderness.  Lymphadenopathy:    He has no cervical adenopathy.  Skin: He is not diaphoretic.  Vitals reviewed.         Assessment & Plan:

## 2014-06-19 NOTE — Telephone Encounter (Signed)
Here for appt. Refilled

## 2014-06-21 DIAGNOSIS — Z8679 Personal history of other diseases of the circulatory system: Secondary | ICD-10-CM | POA: Insufficient documentation

## 2014-06-21 DIAGNOSIS — Z9889 Other specified postprocedural states: Secondary | ICD-10-CM

## 2014-06-21 NOTE — Progress Notes (Signed)
Cardiology Office Note   Date:  06/22/2014   ID:  Dylan Morgan, DOB 1946-04-03, MRN [id number]  PCP:  Aldine Contes, MD  Cardiologist:  Dr. Cleatis Polka     Chief Complaint  Patient presents with  . Hospitalization Follow-up    ED visit with Chest Pain 06/10/14  . Coronary Artery Disease     History of Present Illness: Dylan Morgan is a 69 y.o. male with a hx of CAD, status post CABG in 2004, AAA, status post endovascular stenting 02/2009, parox atrial fibrillation, prior stroke in 01/2011 (right perirolandic subcortical) treated with tPA and right corona radiota stroke 08/2011, renal insufficiency, HTN, HL. He has a known occluded left internal carotid artery.   Seen in ED 06/10/14 with cough, SOB and some CP.  CXR and CEs were ok.  Symptoms improved with nebulizer Rx.  DC with OP FU recommended.  He returns for FU.  He is feeling much better. His symptoms had gone on  for 2 days before going to the emergency room. He had discomfort in his chest and arms. He had it with activity as well as positional changes. He had significant cough. The nebulizer treatment in the emergency room did help. He's been taking Robitussin since. This has also helped. His symptoms have completely resolved. He mainly complains of allergic rhinitis symptoms. He denies exertional chest pain or shortness of breath. He denies orthopnea, PND or edema. He denies syncope.   Studies/Reports Reviewed Today:  Carotid US 9/15 R < 40%;  L known occluded  Myoview 5/15 Low risk stress nuclear study with a very small fixed apical defect, likely "apical thinning". A small apical infarction cannot be excluded, especially in the absence of gated images. No ischemia is seen. LV Ejection Fraction: Study not gated. LV Wall Motion: Study not gated  Echo 11/2011 Moderate LVH, EF 16-01%, normal diastolic fxn.    Past Medical History  Diagnosis Date  . CAD (coronary artery disease) CABG in 2004    a. 2004 s/p CABG  .  GERD (gastroesophageal reflux disease)   . Hypertension   . Hyperlipidemia   . Renal insufficiency   . Tobacco abuse   . Seborrheic dermatitis of scalp   . Bradycardia   . AAA (abdominal aortic aneurysm)     a. 2010 s/p stenting  . Paroxysmal atrial fibrillation 02/22/2009    a. on Coumadin   . Carotid artery disease     a. 2012 dopplers with old LICA occlusion , RICA no significant abnormality  . Arthritis   . Prediabetes   . CVA (cerebral infarction)     a. 2013 R Carona radiata stroke     Past Surgical History  Procedure Laterality Date  . Coronary artery bypass graft  2004  . Rotator cuff repair    . Abdominal aortic aneurysm repair  2010    Aortic stent repair     Current Outpatient Prescriptions  Medication Sig Dispense Refill  . aspirin EC 81 MG tablet Take 81 mg by mouth every other day. After supper    . lisinopril (PRINIVIL,ZESTRIL) 10 MG tablet TAKE ONE TABLET BY MOUTH ONCE DAILY 90 tablet 3  . simvastatin (ZOCOR) 40 MG tablet Take 1 tablet (40 mg total) by mouth daily after supper. 90 tablet 2  . warfarin (COUMADIN) 5 MG tablet TAKE AS DIRECTED BY  COUMADIN  CLINIC 35 tablet 3   No current facility-administered medications for this visit.    Allergies:   Diltiazem  hcl and Oxycodone-acetaminophen    Social History:  The patient  reports that he quit smoking about 4 years ago. His smoking use included Cigarettes. He has a 17.5 pack-year smoking history. He has never used smokeless tobacco. He reports that he does not drink alcohol or use illicit drugs.   Family History:  The patient's family history includes Cancer in his sister; Diabetes in his mother; Heart disease in his brother; Stroke in his father.    ROS:   Please see the history of present illness.   Review of Systems  Constitution: Negative for fever.  HENT: Positive for congestion.   Respiratory: Negative for cough.   Gastrointestinal: Negative for diarrhea, hematochezia, melena and vomiting.    All other systems reviewed and are negative.    PHYSICAL EXAM: VS:  BP 106/62 mmHg  Pulse 67  Ht 5\' 11"  (1.803 m)  Wt 224 lb (101.606 kg)  BMI 31.26 kg/m2    Wt Readings from Last 3 Encounters:  06/22/14 224 lb (101.606 kg)  06/19/14 228 lb (103.42 kg)  02/13/14 233 lb 4.8 oz (105.824 kg)     GEN: Well nourished, well developed, in no acute distress HEENT: normal Neck: no JVD, no masses Cardiac:  Normal S1/S2, RRR; no murmur ,  no rubs or gallops, no edema  Respiratory:  clear to auscultation bilaterally, no wheezing, rhonchi or rales. GI: soft, nontender, nondistended, + BS MS: no deformity or atrophy Skin: warm and dry  Neuro:  CNs II-XII intact, Strength and sensation are intact Psych: Normal affect   EKG:  EKG is ordered today.  It demonstrates:   NSR, HR 67, LAD, no change from prior tracing   Recent Labs: 08/26/2013: Pro B Natriuretic peptide (BNP) 536.5* 06/10/2014: B Natriuretic Peptide 24.5; BUN 26*; Creatinine 1.90*; Hemoglobin 14.9; Platelets 160; Potassium 4.6; Sodium 138    Lipid Panel    Component Value Date/Time   CHOL 136 11/24/2011 0332   TRIG 445* 11/24/2011 0332   HDL 35* 11/24/2011 0332   CHOLHDL 3.9 11/24/2011 0332   VLDL UNABLE TO CALCULATE IF TRIGLYCERIDE OVER 400 mg/dL 11/24/2011 0332   LDLCALC UNABLE TO CALCULATE IF TRIGLYCERIDE OVER 400 mg/dL 11/24/2011 0332   LDLDIRECT 46 11/24/2011 0915      ASSESSMENT AND PLAN:  Chest pain, unspecified chest pain type His symptoms were related to viral URI. They have completely resolved. He had a low risk Myoview last year. No further cardiac testing is warranted.  Coronary artery disease involving native coronary artery of native heart without angina pectoris No angina. Continue aspirin, ACE inhibitor, statin. As noted, Myoview in 08/2013 was low risk.   Bilateral carotid artery disease Follow-up with Dr. Kellie Simmering as planned.  Hyperlipidemia Continue statin. Arrange fasting lipids and  LFTs.  Essential hypertension Controlled.  S/P AAA (abdominal aortic aneurysm) repair Follow-up with Dr. Kellie Simmering as planned.  CKD (chronic kidney disease), stage III  Recent creatinine stable.   Current medicines are reviewed at length with the patient today.  The patient does not have concerns regarding medicines.  The following changes have been made:  no change  Labs/ tests ordered today include:   Orders Placed This Encounter  Procedures  . Lipid Profile  . Hepatic function panel  . EKG 12-Lead    Disposition:   FU with Dr. Cleatis Polka 3 mos.    Signed, Versie Starks, MHS 06/22/2014 8:30 AM    [facility] [place], [place], [place]  [address]  Phone: [phone]; Fax: [phone]

## 2014-06-22 ENCOUNTER — Other Ambulatory Visit: Payer: Self-pay | Admitting: *Deleted

## 2014-06-22 ENCOUNTER — Ambulatory Visit (INDEPENDENT_AMBULATORY_CARE_PROVIDER_SITE_OTHER): Payer: Medicare HMO | Admitting: Physician Assistant

## 2014-06-22 ENCOUNTER — Encounter: Payer: Self-pay | Admitting: Physician Assistant

## 2014-06-22 VITALS — BP 106/62 | HR 67 | Ht 71.0 in | Wt 224.0 lb

## 2014-06-22 DIAGNOSIS — I10 Essential (primary) hypertension: Secondary | ICD-10-CM

## 2014-06-22 DIAGNOSIS — I739 Peripheral vascular disease, unspecified: Secondary | ICD-10-CM

## 2014-06-22 DIAGNOSIS — I779 Disorder of arteries and arterioles, unspecified: Secondary | ICD-10-CM

## 2014-06-22 DIAGNOSIS — N183 Chronic kidney disease, stage 3 unspecified: Secondary | ICD-10-CM

## 2014-06-22 DIAGNOSIS — Z9889 Other specified postprocedural states: Secondary | ICD-10-CM

## 2014-06-22 DIAGNOSIS — Z8679 Personal history of other diseases of the circulatory system: Secondary | ICD-10-CM

## 2014-06-22 DIAGNOSIS — I2510 Atherosclerotic heart disease of native coronary artery without angina pectoris: Secondary | ICD-10-CM

## 2014-06-22 DIAGNOSIS — E785 Hyperlipidemia, unspecified: Secondary | ICD-10-CM

## 2014-06-22 DIAGNOSIS — R079 Chest pain, unspecified: Secondary | ICD-10-CM

## 2014-06-22 MED ORDER — LISINOPRIL 10 MG PO TABS: 10.0000 mg | ORAL_TABLET | Freq: Every day | ORAL | Status: DC

## 2014-06-22 MED ORDER — SIMVASTATIN 40 MG PO TABS: 40.0000 mg | ORAL_TABLET | Freq: Every day | ORAL | Status: DC

## 2014-06-22 MED ORDER — WARFARIN SODIUM 5 MG PO TABS: 5.0000 mg | ORAL_TABLET | ORAL | Status: DC

## 2014-06-22 NOTE — Progress Notes (Signed)
Internal Medicine Clinic Attending  Case discussed with Dr. Rothman at the time of the visit.  We reviewed the resident's history and exam and pertinent patient test results.  I agree with the assessment, diagnosis, and plan of care documented in the resident's note. 

## 2014-06-22 NOTE — Patient Instructions (Signed)
Your physician recommends that you schedule a follow-up appointment in: 3 MONTHS WITH DR. KATZ  FASTING LIPID AND LIVER PANEL 3/25 WHEN YOU HAVE YOUR COUMADIN APPT  REFILLS FOR LISINOPRIL AND SIMVASTATIN HAVE BEEN SENT IN

## 2014-06-26 ENCOUNTER — Telehealth: Payer: Self-pay | Admitting: *Deleted

## 2014-06-26 ENCOUNTER — Ambulatory Visit (INDEPENDENT_AMBULATORY_CARE_PROVIDER_SITE_OTHER): Payer: Medicare HMO | Admitting: *Deleted

## 2014-06-26 ENCOUNTER — Other Ambulatory Visit (INDEPENDENT_AMBULATORY_CARE_PROVIDER_SITE_OTHER): Payer: Medicare HMO | Admitting: *Deleted

## 2014-06-26 DIAGNOSIS — I480 Paroxysmal atrial fibrillation: Secondary | ICD-10-CM

## 2014-06-26 DIAGNOSIS — Z7901 Long term (current) use of anticoagulants: Secondary | ICD-10-CM | POA: Diagnosis not present

## 2014-06-26 DIAGNOSIS — I639 Cerebral infarction, unspecified: Secondary | ICD-10-CM

## 2014-06-26 DIAGNOSIS — I4891 Unspecified atrial fibrillation: Secondary | ICD-10-CM

## 2014-06-26 DIAGNOSIS — E785 Hyperlipidemia, unspecified: Secondary | ICD-10-CM

## 2014-06-26 DIAGNOSIS — Z5181 Encounter for therapeutic drug level monitoring: Secondary | ICD-10-CM

## 2014-06-26 DIAGNOSIS — I6310 Cerebral infarction due to embolism of unspecified precerebral artery: Secondary | ICD-10-CM

## 2014-06-26 LAB — HEPATIC FUNCTION PANEL
ALK PHOS: 71 U/L (ref 39–117)
ALT: 13 U/L (ref 0–53)
AST: 19 U/L (ref 0–37)
Albumin: 4 g/dL (ref 3.5–5.2)
Total Bilirubin: 0.4 mg/dL (ref 0.2–1.2)
Total Protein: 7.9 g/dL (ref 6.0–8.3)

## 2014-06-26 LAB — LIPID PANEL
Cholesterol: 111 mg/dL (ref 0–200)
HDL: 39 mg/dL — AB (ref >=40)
LDL Cholesterol: 32 mg/dL (ref 0–99)
Total CHOL/HDL Ratio: 2.8 Ratio
Triglycerides: 202 mg/dL — ABNORMAL HIGH (ref <150)
VLDL: 40 mg/dL (ref 0–40)

## 2014-06-26 LAB — POCT INR: INR: 2.1

## 2014-06-26 NOTE — Telephone Encounter (Signed)
pt notified about lab results with verbal understanding  

## 2014-06-26 NOTE — Addendum Note (Signed)
Addended by: Eulis Foster on: 06/26/2014 08:41 AM   Modules accepted: Orders

## 2014-08-07 ENCOUNTER — Ambulatory Visit (INDEPENDENT_AMBULATORY_CARE_PROVIDER_SITE_OTHER): Payer: Medicare HMO

## 2014-08-07 DIAGNOSIS — I480 Paroxysmal atrial fibrillation: Secondary | ICD-10-CM

## 2014-08-07 DIAGNOSIS — Z7901 Long term (current) use of anticoagulants: Secondary | ICD-10-CM

## 2014-08-07 DIAGNOSIS — I4891 Unspecified atrial fibrillation: Secondary | ICD-10-CM | POA: Diagnosis not present

## 2014-08-07 DIAGNOSIS — Z5181 Encounter for therapeutic drug level monitoring: Secondary | ICD-10-CM | POA: Diagnosis not present

## 2014-08-07 DIAGNOSIS — I639 Cerebral infarction, unspecified: Secondary | ICD-10-CM

## 2014-08-07 LAB — POCT INR: INR: 2.5

## 2014-09-07 ENCOUNTER — Ambulatory Visit (INDEPENDENT_AMBULATORY_CARE_PROVIDER_SITE_OTHER): Payer: Medicare HMO | Admitting: Cardiology

## 2014-09-07 ENCOUNTER — Encounter: Payer: Self-pay | Admitting: Cardiology

## 2014-09-07 VITALS — BP 136/86 | HR 52 | Ht 71.0 in | Wt 227.8 lb

## 2014-09-07 DIAGNOSIS — I480 Paroxysmal atrial fibrillation: Secondary | ICD-10-CM | POA: Diagnosis not present

## 2014-09-07 DIAGNOSIS — I2510 Atherosclerotic heart disease of native coronary artery without angina pectoris: Secondary | ICD-10-CM | POA: Diagnosis not present

## 2014-09-07 DIAGNOSIS — I714 Abdominal aortic aneurysm, without rupture, unspecified: Secondary | ICD-10-CM

## 2014-09-07 DIAGNOSIS — I779 Disorder of arteries and arterioles, unspecified: Secondary | ICD-10-CM | POA: Diagnosis not present

## 2014-09-07 DIAGNOSIS — I739 Peripheral vascular disease, unspecified: Secondary | ICD-10-CM

## 2014-09-07 DIAGNOSIS — Z7901 Long term (current) use of anticoagulants: Secondary | ICD-10-CM

## 2014-09-07 NOTE — Assessment & Plan Note (Signed)
Coronary disease is stable. No change in therapy. His nuclear scan in May, 2015 revealed apical thinning and a normal ejection fraction.

## 2014-09-07 NOTE — Assessment & Plan Note (Signed)
Patient's abdominal aortic aneurysm was stented in the past. He is stable.

## 2014-09-07 NOTE — Assessment & Plan Note (Signed)
At this time he remains on Coumadin for his atrial fibrillation.

## 2014-09-07 NOTE — Patient Instructions (Signed)
**Note De-Identified  Obfuscation** Medication Instructions:  Same-No change  Labwork: None  Testing/Procedures: None  Follow-Up: Your physician wants you to follow-up in: 6 months. You will receive a reminder letter in the mail two months in advance. If you don't receive a letter, please call our office to schedule the follow-up appointment.

## 2014-09-07 NOTE — Assessment & Plan Note (Signed)
Patient is not symptomatic from this. He is anticoagulated.

## 2014-09-07 NOTE — Assessment & Plan Note (Signed)
His carotid disease is followed by Dr. Kellie Simmering.

## 2014-09-07 NOTE — Progress Notes (Signed)
Cardiology Office Note   Date:  09/07/2014   ID:  KHYRAN RIERA, DOB [birth year], MRN [id number]  PCP:  Aldine Contes, MD  Cardiologist:  Dola Argyle, MD   Chief Complaint  Patient presents with  . Appointment    Follow-up CAD      History of Present Illness: Dylan Morgan is a 69 y.o. male who presents today to follow-up coronary artery disease. I saw him last June, 2015. He was seen in the office by Mr. Kathlen Mody March, 2016. At that time he was seen after an emergency room visit for cough and chest pain. There was no MI. He received pulmonary treatment and improved. He was stable when he was seen in the office in March. Currently he is not having any significant chest pain or shortness of breath.  Past Medical History  Diagnosis Date  . CAD (coronary artery disease) CABG in 2004    a. 2004 s/p CABG  . GERD (gastroesophageal reflux disease)   . Hypertension   . Hyperlipidemia   . Renal insufficiency   . Tobacco abuse   . Seborrheic dermatitis of scalp   . Bradycardia   . AAA (abdominal aortic aneurysm)     a. 2010 s/p stenting  . Paroxysmal atrial fibrillation 02/22/2009    a. on Coumadin   . Carotid artery disease     a. 2012 dopplers with old LICA occlusion , RICA no significant abnormality  . Arthritis   . Prediabetes   . CVA (cerebral infarction)     a. 2013 R Carona radiata stroke     Past Surgical History  Procedure Laterality Date  . Coronary artery bypass graft  2004  . Rotator cuff repair    . Abdominal aortic aneurysm repair  2010    Aortic stent repair    Patient Active Problem List   Diagnosis Date Noted  . S/P AAA (abdominal aortic aneurysm) repair 06/21/2014  . Acute upper respiratory infection 06/19/2014  . Ingrown right big toenail 02/13/2014  . Peripheral neuropathy 01/16/2014  . Chest pain 08/26/2013  . Shortness of breath 07/28/2013  . Acute upper respiratory infections of unspecified site 07/11/2013  . Encounter for therapeutic drug  monitoring 05/12/2013  . Nasal congestion 04/14/2013  . Onychomycosis 01/10/2013  . Rash and nonspecific skin eruption 10/09/2012  . Allergic urticaria 08/09/2012  . Chronic anticoagulation 05/01/2012  . Neck pain, chronic 03/13/2012  . Lower extremity pain, bilateral 03/09/2012  . H/O TIA (transient ischemic attack) and stroke 01/02/2012  . Abdominal aneurysm without mention of rupture 01/02/2012  . Occlusion and stenosis of carotid artery without mention of cerebral infarction 12/19/2011  . TIA (transient ischemic attack) 11/23/2011  . Preventative health care 09/20/2011  . CVA (cerebral infarction)   . Prediabetes   . Long term (current) use of anticoagulants 08/04/2011  . Carotid artery disease   . CAD (coronary artery disease)   . Hypertension   . Hyperlipidemia   . Bradycardia   . Ejection fraction   . Hx of CABG   . AAA (abdominal aortic aneurysm)   . Leg cramps, sleep related 04/07/2011  . Erectile dysfunction 02/06/2011  . Paroxysmal atrial fibrillation 02/22/2009  . DERMATITIS, SEBORRHEIC 07/10/2008  . DEPRESSIVE DISORDER 02/04/2007  . SLEEP APNEA 02/04/2007  . GERD 02/20/2006  . CKD (chronic kidney disease), stage III 02/20/2006  . BACK PAIN, CHRONIC 02/20/2006      Current Outpatient Prescriptions  Medication Sig Dispense Refill  . aspirin EC 81 MG  tablet Take 81 mg by mouth every other day. After supper    . lisinopril (PRINIVIL,ZESTRIL) 10 MG tablet Take 1 tablet (10 mg total) by mouth daily. 90 tablet 3  . simvastatin (ZOCOR) 40 MG tablet Take 1 tablet (40 mg total) by mouth daily after supper. 90 tablet 3  . warfarin (COUMADIN) 5 MG tablet Take 1 tablet (5 mg total) by mouth as directed. 35 tablet 3   No current facility-administered medications for this visit.    Allergies:   Diltiazem hcl and Oxycodone-acetaminophen    Social History:  The patient  reports that he quit smoking about 4 years ago. His smoking use included Cigarettes. He has a 17.5  pack-year smoking history. He has never used smokeless tobacco. He reports that he does not drink alcohol or use illicit drugs.   Family History:  The patient's family history includes Cancer in his sister; Diabetes in his mother; Heart disease in his brother; Stroke in his father.    ROS:  Please see the history of present illness.   Patient denies fever, chills, headache, sweats, rash, change in vision, change in hearing, chest pain, cough, nausea or vomiting, urinary symptoms. He says that he has an unusual sensation in the bottom of both feet. He will follow-up with his primary team regarding this. All other systems are reviewed and are negative.   PHYSICAL EXAM: VS:  BP 136/86 mmHg  Pulse 52  Ht '5\' 11"'$  (1.803 m)  Wt 227 lb 12.8 oz (103.329 kg)  BMI 31.79 kg/m2  SpO2 97% , Patient is overweight. He is oriented to person time and place. Affect is normal. Head is atraumatic. The patient's left eye turns inward. Lungs are clear. Respiratory effort is not labored. Cardiac exam reveals S1 and S2. The abdomen is soft. There is no peripheral edema. There are no musculoskeletal deformities. There are no skin rashes.  EKG:   EKG is not done today.   Recent Labs: 06/10/2014: B Natriuretic Peptide 24.5; BUN 26*; Creatinine 1.90*; Hemoglobin 14.9; Platelets 160; Potassium 4.6; Sodium 138 06/26/2014: ALT 13    Lipid Panel    Component Value Date/Time   CHOL 111 06/26/2014 0841   TRIG 202* 06/26/2014 0841   HDL 39* 06/26/2014 0841   CHOLHDL 2.8 06/26/2014 0841   VLDL 40 06/26/2014 0841   LDLCALC 32 06/26/2014 0841   LDLDIRECT 46 11/24/2011 0915      Wt Readings from Last 3 Encounters:  09/07/14 227 lb 12.8 oz (103.329 kg)  06/22/14 224 lb (101.606 kg)  06/19/14 228 lb (103.42 kg)      Current medicines are reviewed  The patient understands his medications.     ASSESSMENT AND PLAN:

## 2014-09-18 ENCOUNTER — Ambulatory Visit (INDEPENDENT_AMBULATORY_CARE_PROVIDER_SITE_OTHER): Payer: Medicare HMO | Admitting: *Deleted

## 2014-09-18 DIAGNOSIS — Z7901 Long term (current) use of anticoagulants: Secondary | ICD-10-CM | POA: Diagnosis not present

## 2014-09-18 DIAGNOSIS — I639 Cerebral infarction, unspecified: Secondary | ICD-10-CM | POA: Diagnosis not present

## 2014-09-18 DIAGNOSIS — I4891 Unspecified atrial fibrillation: Secondary | ICD-10-CM

## 2014-09-18 DIAGNOSIS — Z5181 Encounter for therapeutic drug level monitoring: Secondary | ICD-10-CM

## 2014-09-18 DIAGNOSIS — I6310 Cerebral infarction due to embolism of unspecified precerebral artery: Secondary | ICD-10-CM

## 2014-09-18 DIAGNOSIS — I480 Paroxysmal atrial fibrillation: Secondary | ICD-10-CM

## 2014-09-18 LAB — POCT INR: INR: 3

## 2014-10-30 ENCOUNTER — Ambulatory Visit (INDEPENDENT_AMBULATORY_CARE_PROVIDER_SITE_OTHER): Payer: Medicare HMO | Admitting: *Deleted

## 2014-10-30 DIAGNOSIS — Z7901 Long term (current) use of anticoagulants: Secondary | ICD-10-CM

## 2014-10-30 DIAGNOSIS — Z5181 Encounter for therapeutic drug level monitoring: Secondary | ICD-10-CM

## 2014-10-30 DIAGNOSIS — I639 Cerebral infarction, unspecified: Secondary | ICD-10-CM | POA: Diagnosis not present

## 2014-10-30 DIAGNOSIS — I4891 Unspecified atrial fibrillation: Secondary | ICD-10-CM

## 2014-10-30 DIAGNOSIS — I6310 Cerebral infarction due to embolism of unspecified precerebral artery: Secondary | ICD-10-CM

## 2014-10-30 DIAGNOSIS — I480 Paroxysmal atrial fibrillation: Secondary | ICD-10-CM

## 2014-10-30 LAB — POCT INR: INR: 3.2

## 2014-12-14 ENCOUNTER — Ambulatory Visit (INDEPENDENT_AMBULATORY_CARE_PROVIDER_SITE_OTHER): Payer: Medicare HMO | Admitting: Internal Medicine

## 2014-12-14 ENCOUNTER — Encounter: Payer: Self-pay | Admitting: Internal Medicine

## 2014-12-14 VITALS — BP 154/86 | HR 50 | Temp 97.7°F | Ht 65.0 in | Wt 230.8 lb

## 2014-12-14 DIAGNOSIS — N183 Chronic kidney disease, stage 3 unspecified: Secondary | ICD-10-CM

## 2014-12-14 DIAGNOSIS — I129 Hypertensive chronic kidney disease with stage 1 through stage 4 chronic kidney disease, or unspecified chronic kidney disease: Secondary | ICD-10-CM

## 2014-12-14 DIAGNOSIS — Z23 Encounter for immunization: Secondary | ICD-10-CM

## 2014-12-14 DIAGNOSIS — Z0000 Encounter for general adult medical examination without abnormal findings: Secondary | ICD-10-CM

## 2014-12-14 DIAGNOSIS — G629 Polyneuropathy, unspecified: Secondary | ICD-10-CM

## 2014-12-14 NOTE — Assessment & Plan Note (Addendum)
Unsure why pt has such significant CKD- GFR- 30s. His Bp has been controlled with Lisinopril '10mg'$  daily. UA unremarkable. HgBA1c- 5.4- 2015. CKD present since 2007 per chart. AAA has been stented in 2010, and involved the part of the aorta below the renal arteries.Marland Kitchen

## 2014-12-14 NOTE — Patient Instructions (Signed)
Please be sure to call us with the medication you are taking so we can tell you what dose to take, as an increase in the amount you are presently taking may help you mch better.  Also be sure before your colonoscopy, that you follow up with your heart doctor.

## 2014-12-14 NOTE — Assessment & Plan Note (Signed)
Referrla for Colonoscopy placed, will follow up with cardiology before procedure.

## 2014-12-14 NOTE — Progress Notes (Signed)
Patient ID: Dylan Morgan, male   DOB: 1945-09-18, 69 y.o.   MRN: [id number]   Subjective:   Patient ID: Dylan Morgan male   DOB: 1945/09/28 69 y.o.   MRN: [id number]  HPI: Dylan Morgan is a 69 y.o. with PMH listed below, presented to clinic today with c/o burning pain in his hands that have returned and have been going on for about 2-3 months. Please see assessment and plan for details.   Past Medical History  Diagnosis Date  . CAD (coronary artery disease) CABG in 2004    a. 2004 s/p CABG  . GERD (gastroesophageal reflux disease)   . Hypertension   . Hyperlipidemia   . Renal insufficiency   . Tobacco abuse   . Seborrheic dermatitis of scalp   . Bradycardia   . AAA (abdominal aortic aneurysm)     a. 2010 s/p stenting  . Paroxysmal atrial fibrillation 02/22/2009    a. on Coumadin   . Carotid artery disease     a. 2012 dopplers with old LICA occlusion , RICA no significant abnormality  . Arthritis   . Prediabetes   . CVA (cerebral infarction)     a. 2013 R Carona radiata stroke    Current Outpatient Prescriptions  Medication Sig Dispense Refill  . aspirin EC 81 MG tablet Take 81 mg by mouth every other day. After supper    . lisinopril (PRINIVIL,ZESTRIL) 10 MG tablet Take 1 tablet (10 mg total) by mouth daily. 90 tablet 3  . simvastatin (ZOCOR) 40 MG tablet Take 1 tablet (40 mg total) by mouth daily after supper. 90 tablet 3  . warfarin (COUMADIN) 5 MG tablet Take 1 tablet (5 mg total) by mouth as directed. 35 tablet 3   No current facility-administered medications for this visit.   Family History  Problem Relation Age of Onset  . Diabetes Mother   . Stroke Father   . Cancer Sister     thyroid cancer  . Heart disease Brother     Coronary artery disease   Social History   Social History  . Marital Status: Single    Spouse Name: N/A  . Number of Children: N/A  . Years of Education: N/A   Occupational History  . disabled    Social History Main Topics  .  Smoking status: Former Smoker -- 0.50 packs/day for 35 years    Types: Cigarettes    Quit date: 02/03/2010  . Smokeless tobacco: Never Used  . Alcohol Use: No  . Drug Use: No  . Sexual Activity: Not Asked   Other Topics Concern  . None   Social History Narrative   Lives with son and his significant other, denies being married. Has 3 daughters & a son.  Several grandchildren.    Divorced.   Regular exercise.   Prior to retirement, hung dry wall for a living.    Review of Systems: CONSTITUTIONAL- No Fever or change in appetite. SKIN- No Rash, colour changes or itching. HEAD- No Headache or dizziness. RESPIRATORY- No Cough or SOB. CARDIAC- No Palpitations, DOE, PND or chest pain. GI- No  vomiting, diarrhoea, abd pain. URINARY- No Frequency, or dysuria. NEUROLOGIC- Has burning in hands and feet.  Objective:  Physical Exam: Filed Vitals:   12/14/14 1323  BP: 154/86  Pulse: 50  Temp: 97.7 F (36.5 C)  TempSrc: Oral  Height: '5\' 5"'$  (1.651 m)  Weight: 230 lb 12.8 oz (104.69 kg)  SpO2: 100%   GENERAL- alert,  co-operative, appears as stated age, not in any distress. HEENT- Atraumatic, normocephalic, PERRL, neck supple. CARDIAC- irregular, no murmurs, rubs or gallops. RESP- Moving equal volumes of air, and clear to auscultation bilaterally, no wheezes or crackles. ABDOMEN- Soft, nontender, bowel sounds present. NEURO- Alert and oriented, gait normal EXTREMITIES- pulse 2+, symmetric, no pedal edema. Hand sensation intact, feet numbness- big toes, bilat, position sense intact. SKIN- Warm, dry, No rash or lesion. PSYCH- Normal mood and affect, appropriate thought content and speech.  Assessment & Plan:  The patient's case and plan of care was discussed with attending physician, Dr. Evette Doffing.  Please see problem based charting for assessment and plan.

## 2014-12-14 NOTE — Assessment & Plan Note (Addendum)
BP Readings from Last 3 Encounters:  12/14/14 154/86  09/07/14 136/86  06/22/14 106/62    Lab Results  Component Value Date   NA 138 06/10/2014   K 4.6 06/10/2014   CREATININE 1.90* 06/10/2014    Assessment: Blood pressure control:  Previously better controlled Comments: Complaint with lisinopril- '10mg'$  daily.  Plan: Medications:  continue current medications Educational resources provided: brochure, handout, video Self management tools provided:   Other plans: Bmet, check renal function.  - bring Bp log next visit.  Addendum- Significant improvement in kidney function- cr- 1.3 , was previously 1.9.

## 2014-12-14 NOTE — Assessment & Plan Note (Addendum)
Pt burning pain largely resolved last Nov 2015, he had stopped taking his meds- but has returned over the past 3-4 months. He is now taking either Lyrica Or gabapentin, he does not know which and it is not on med list. Past work up for neuropathy failed to reveal a cause- Vit B12- which was low normal, but MMA was not done, HIV, never took alcohol, not a diabetic, ANA negative. EMG/nerve conduction studies were to be done, but results not in EPIC, no documentation they were done, patient does not know. Will order tests.   Plan- EMG/NCS - Pt to call back to let us know what med he is taking and the dose, can adjust/increase dose if no response os far - Also consider heavy metal screen, though no indication so far of exposure.

## 2014-12-15 ENCOUNTER — Other Ambulatory Visit: Payer: Self-pay | Admitting: Internal Medicine

## 2014-12-15 LAB — BMP8+ANION GAP
ANION GAP: 20 mmol/L — AB (ref 10.0–18.0)
BUN/Creatinine Ratio: 16 (ref 10–22)
BUN: 21 mg/dL (ref 8–27)
CHLORIDE: 100 mmol/L (ref 97–108)
CO2: 21 mmol/L (ref 18–29)
Calcium: 9.6 mg/dL (ref 8.6–10.2)
Creatinine, Ser: 1.34 mg/dL — ABNORMAL HIGH (ref 0.76–1.27)
GFR calc Af Amer: 61 mL/min/{1.73_m2} (ref >59)
GFR calc non Af Amer: 53 mL/min/{1.73_m2} — ABNORMAL LOW (ref >59)
Glucose: 91 mg/dL (ref 65–99)
Potassium: 4.9 mmol/L (ref 3.5–5.2)
Sodium: 141 mmol/L (ref 134–144)

## 2014-12-15 MED ORDER — PREGABALIN 50 MG PO CAPS: 50.0000 mg | ORAL_CAPSULE | Freq: Three times a day (TID) | ORAL | Status: DC

## 2014-12-15 NOTE — Progress Notes (Signed)
Internal Medicine Clinic Attending  Case discussed with Dr. Emokpae soon after the resident saw the patient.  We reviewed the resident's history and exam and pertinent patient test results.  I agree with the assessment, diagnosis, and plan of care documented in the resident's note. 

## 2014-12-16 NOTE — Progress Notes (Signed)
Call from pharmacy, needed to know the qty on rx. Qty #90, phone call complete.Despina Hidden Cassady9/14/20163:09 PM

## 2014-12-28 ENCOUNTER — Encounter (HOSPITAL_COMMUNITY): Payer: Self-pay | Admitting: Emergency Medicine

## 2014-12-28 ENCOUNTER — Emergency Department (HOSPITAL_COMMUNITY)
Admission: EM | Admit: 2014-12-28 | Discharge: 2014-12-28 | Disposition: A | Discharge status: Home / Self Care | Payer: Medicare HMO | Attending: Emergency Medicine | Admitting: Emergency Medicine

## 2014-12-28 ENCOUNTER — Encounter: Payer: Self-pay | Admitting: Family

## 2014-12-28 ENCOUNTER — Emergency Department (HOSPITAL_COMMUNITY): Payer: Medicare HMO

## 2014-12-28 DIAGNOSIS — Z872 Personal history of diseases of the skin and subcutaneous tissue: Secondary | ICD-10-CM | POA: Insufficient documentation

## 2014-12-28 DIAGNOSIS — E785 Hyperlipidemia, unspecified: Secondary | ICD-10-CM | POA: Diagnosis not present

## 2014-12-28 DIAGNOSIS — W228XXA Striking against or struck by other objects, initial encounter: Secondary | ICD-10-CM | POA: Diagnosis not present

## 2014-12-28 DIAGNOSIS — Z87448 Personal history of other diseases of urinary system: Secondary | ICD-10-CM | POA: Insufficient documentation

## 2014-12-28 DIAGNOSIS — Z951 Presence of aortocoronary bypass graft: Secondary | ICD-10-CM | POA: Diagnosis not present

## 2014-12-28 DIAGNOSIS — Z7901 Long term (current) use of anticoagulants: Secondary | ICD-10-CM | POA: Diagnosis not present

## 2014-12-28 DIAGNOSIS — S6992XA Unspecified injury of left wrist, hand and finger(s), initial encounter: Secondary | ICD-10-CM | POA: Diagnosis present

## 2014-12-28 DIAGNOSIS — Z8673 Personal history of transient ischemic attack (TIA), and cerebral infarction without residual deficits: Secondary | ICD-10-CM | POA: Insufficient documentation

## 2014-12-28 DIAGNOSIS — I480 Paroxysmal atrial fibrillation: Secondary | ICD-10-CM | POA: Diagnosis not present

## 2014-12-28 DIAGNOSIS — Y9389 Activity, other specified: Secondary | ICD-10-CM | POA: Diagnosis not present

## 2014-12-28 DIAGNOSIS — Z8719 Personal history of other diseases of the digestive system: Secondary | ICD-10-CM | POA: Diagnosis not present

## 2014-12-28 DIAGNOSIS — Y9289 Other specified places as the place of occurrence of the external cause: Secondary | ICD-10-CM | POA: Diagnosis not present

## 2014-12-28 DIAGNOSIS — I10 Essential (primary) hypertension: Secondary | ICD-10-CM | POA: Insufficient documentation

## 2014-12-28 DIAGNOSIS — Z87891 Personal history of nicotine dependence: Secondary | ICD-10-CM | POA: Diagnosis not present

## 2014-12-28 DIAGNOSIS — Y998 Other external cause status: Secondary | ICD-10-CM | POA: Diagnosis not present

## 2014-12-28 DIAGNOSIS — Z7982 Long term (current) use of aspirin: Secondary | ICD-10-CM | POA: Insufficient documentation

## 2014-12-28 DIAGNOSIS — I2510 Atherosclerotic heart disease of native coronary artery without angina pectoris: Secondary | ICD-10-CM | POA: Insufficient documentation

## 2014-12-28 DIAGNOSIS — S60212A Contusion of left wrist, initial encounter: Secondary | ICD-10-CM | POA: Diagnosis not present

## 2014-12-28 DIAGNOSIS — M1990 Unspecified osteoarthritis, unspecified site: Secondary | ICD-10-CM | POA: Insufficient documentation

## 2014-12-28 DIAGNOSIS — Z79899 Other long term (current) drug therapy: Secondary | ICD-10-CM | POA: Insufficient documentation

## 2014-12-28 MED ORDER — TRAMADOL HCL 50 MG PO TABS: 50.0000 mg | ORAL_TABLET | Freq: Four times a day (QID) | ORAL | Status: DC | PRN

## 2014-12-28 MED ORDER — ACETAMINOPHEN 325 MG PO TABS
650.0000 mg | ORAL_TABLET | Freq: Once | ORAL | Status: AC
  Administered 2014-12-28: 650 mg via ORAL
  Filled 2014-12-28: qty 2

## 2014-12-28 NOTE — ED Notes (Signed)
Ice pack applied to left wrist

## 2014-12-28 NOTE — Discharge Instructions (Signed)
Take tramadol as directed as needed for pain. Ice and elevate your shoulder.  Contusion A contusion is a deep bruise. Contusions are the result of an injury that caused bleeding under the skin. The contusion may turn blue, purple, or yellow. Minor injuries will give you a painless contusion, but more severe contusions may stay painful and swollen for a few weeks.  CAUSES  A contusion is usually caused by a blow, trauma, or direct force to an area of the body. SYMPTOMS   Swelling and redness of the injured area.  Bruising of the injured area.  Tenderness and soreness of the injured area.  Pain. DIAGNOSIS  The diagnosis can be made by taking a history and physical exam. An X-ray, CT scan, or MRI may be needed to determine if there were any associated injuries, such as fractures. TREATMENT  Specific treatment will depend on what area of the body was injured. In general, the best treatment for a contusion is resting, icing, elevating, and applying cold compresses to the injured area. Over-the-counter medicines may also be recommended for pain control. Ask your caregiver what the best treatment is for your contusion. HOME CARE INSTRUCTIONS   Put ice on the injured area.  Put ice in a plastic bag.  Place a towel between your skin and the bag.  Leave the ice on for 15-20 minutes, 3-4 times a day, or as directed by your health care provider.  Only take over-the-counter or prescription medicines for pain, discomfort, or fever as directed by your caregiver. Your caregiver may recommend avoiding anti-inflammatory medicines (aspirin, ibuprofen, and naproxen) for 48 hours because these medicines may increase bruising.  Rest the injured area.  If possible, elevate the injured area to reduce swelling. SEEK IMMEDIATE MEDICAL CARE IF:   You have increased bruising or swelling.  You have pain that is getting worse.  Your swelling or pain is not relieved with medicines. MAKE SURE YOU:    Understand these instructions.  Will watch your condition.  Will get help right away if you are not doing well or get worse. Document Released: 12/28/2004 Document Revised: 03/25/2013 Document Reviewed: 01/23/2011 [facility] Patient Information 2015 [place], [place]. This information is not intended to replace advice given to you by your health care provider. Make sure you discuss any questions you have with your health care provider.

## 2014-12-28 NOTE — ED Provider Notes (Signed)
CSN: [id number]     Arrival date & time 12/28/14  2054 History  By signing my name below, I, Soijett Blue, attest that this documentation has been prepared under the direction and in the presence of Lucien Mons, PA-C Electronically Signed: Soijett Blue, ED Scribe. 12/28/2014. 10:35 PM.   Chief Complaint  Patient presents with  . Wrist Pain      The history is provided by the patient. No language interpreter was used.    Dylan Morgan is a 69 y.o. male with a medical hx of arthritis who presents to the Emergency Department complaining of left wrist pain onset this evening. He notes that he was trying to open a door when he missed the doorknob and hit his left wrist against a battery charger trying to keep himself from falling. He began to have pain 3 hours following the incident. He states that he broke his left wrist in 1989 and has had residual swelling to the area. Pt is having associated symptoms of joint swelling. He notes that he has tried tylenol with no relief of his symptoms. He denies color change, wound, rash, fever, and any other symptoms. Denies hx of gout.   Past Medical History  Diagnosis Date  . CAD (coronary artery disease) CABG in 2004    a. 2004 s/p CABG  . GERD (gastroesophageal reflux disease)   . Hypertension   . Hyperlipidemia   . Renal insufficiency   . Tobacco abuse   . Seborrheic dermatitis of scalp   . Bradycardia   . AAA (abdominal aortic aneurysm)     a. 2010 s/p stenting  . Paroxysmal atrial fibrillation 02/22/2009    a. on Coumadin   . Carotid artery disease     a. 2012 dopplers with old LICA occlusion , RICA no significant abnormality  . Arthritis   . Prediabetes   . CVA (cerebral infarction)     a. 2013 R Carona radiata stroke    Past Surgical History  Procedure Laterality Date  . Coronary artery bypass graft  2004  . Rotator cuff repair    . Abdominal aortic aneurysm repair  2010    Aortic stent repair   Family History  Problem Relation  Age of Onset  . Diabetes Mother   . Stroke Father   . Cancer Sister     thyroid cancer  . Heart disease Brother     Coronary artery disease   Social History  Substance Use Topics  . Smoking status: Former Smoker -- 0.00 packs/day for 35 years    Types: Cigarettes    Quit date: 02/03/2010  . Smokeless tobacco: Never Used  . Alcohol Use: No    Review of Systems  Constitutional: Negative for fever.  Musculoskeletal: Positive for joint swelling and arthralgias.  Skin: Negative for color change, rash and wound.  All other systems reviewed and are negative.     Allergies  Diltiazem hcl and Oxycodone-acetaminophen  Home Medications   Prior to Admission medications   Medication Sig Start Date End Date Taking? Authorizing Provider  aspirin EC 81 MG tablet Take 81 mg by mouth every other day. After supper    Historical Provider, MD  lisinopril (PRINIVIL,ZESTRIL) 10 MG tablet Take 1 tablet (10 mg total) by mouth daily. 06/22/14   Liliane Shi, PA-C  pregabalin (LYRICA) 50 MG capsule Take 1 capsule (50 mg total) by mouth 3 (three) times daily. 12/15/14   Ejiroghene Arlyce Dice, MD  simvastatin (ZOCOR) 40 MG tablet  Take 1 tablet (40 mg total) by mouth daily after supper. 06/22/14   Liliane Shi, PA-C  traMADol (ULTRAM) 50 MG tablet Take 1 tablet (50 mg total) by mouth every 6 (six) hours as needed. 12/28/14   Carman Ching, PA-C  warfarin (COUMADIN) 5 MG tablet Take 1 tablet (5 mg total) by mouth as directed. 06/22/14   Carlena Bjornstad, MD   BP 106/75 mmHg  Pulse 88  Temp(Src) 98.7 F (37.1 C) (Oral)  Resp 16  Ht '5\' 7"'$  (1.702 m)  Wt 230 lb (104.327 kg)  BMI 36.01 kg/m2  SpO2 97% Physical Exam  Constitutional: He is oriented to person, place, and time. He appears well-developed and well-nourished. No distress.  HENT:  Head: Normocephalic and atraumatic.  Eyes: Conjunctivae and EOM are normal.  Neck: Normal range of motion. Neck supple.  Cardiovascular: Normal rate, regular rhythm,  normal heart sounds and intact distal pulses.   Pulmonary/Chest: Effort normal and breath sounds normal.  Musculoskeletal:  L wrist- swelling noted throughout (normal per pt) with increased swelling and tenderness over ulnar aspect. No snuffbox tenderness. ROM with ulnar deviation slightly limited secondary to pain. Sensation intact distally.  Neurological: He is alert and oriented to person, place, and time.  Skin: Skin is warm and dry.  Psychiatric: He has a normal mood and affect. His behavior is normal.  Nursing note and vitals reviewed.   ED Course  Procedures (including critical care time) DIAGNOSTIC STUDIES: Oxygen Saturation is 97% on RA, nl by my interpretation.    COORDINATION OF CARE: 10:20 PM Discussed treatment plan with pt at bedside which includes left wrist xray and pt agreed to plan.    Labs Review Labs Reviewed - No data to display  Imaging Review Dg Wrist Complete Left  12/28/2014   CLINICAL DATA:  LEFT wrist pain. Posterior lateral wrist pain and swelling. History of prior wrist fracture.  EXAM: LEFT WRIST - COMPLETE 3+ VIEW  COMPARISON:  None.  FINDINGS: Chronic scaphoid waist fracture is present, mildly displaced. Partially calcified loose body or heterotopic bone adjacent to the radial styloid. Chronic deformity of the distal radius compatible with healed fracture. Irregular articular surface. Chondrocalcinosis of the triangular fibrocartilage suggesting CPPD arthropathy. Fracture through the base of the ulnar styloid. STT joint osteoarthritis. Basal joint of the thumb osteoarthritis. No acute fracture is identified. Loss of the normal volar tilt is present on the lateral projection.  IMPRESSION: Healed and chronic fractures of the wrist with secondary osteoarthritis. Chondrocalcinosis of the triangular fibrocartilage suggesting CPPD arthropathy although this could also be degenerative given the severe posttraumatic osteoarthritis of the wrist.   Electronically Signed    By: Dereck Ligas M.D.   On: 12/28/2014 21:53   I have personally reviewed and evaluated these images and lab results as part of my medical decision-making.   EKG Interpretation None      MDM   Final diagnoses:  Wrist contusion, left, initial encounter   NAD. AFVSS. Wrist pain after mechanical injury. Xray without acute finding. Old fractures noted which the pt mentions. Advised RICE, tylenol. Velcro splint applied. F/u with PCP. Stable for d/c. Return precautions given. Patient states understanding of treatment care plan and is agreeable.  Discussed with attending Dr. Lacinda Axon who also evaluated patient and agrees with plan of care.  I personally performed the services described in this documentation, which was scribed in my presence. The recorded information has been reviewed and is accurate.  Carman Ching, PA-C 12/29/14  [place], MD 12/29/14 1932

## 2014-12-28 NOTE — ED Notes (Signed)
Patient left at this time with all belongings. 

## 2014-12-28 NOTE — ED Notes (Signed)
Pt. missed his step and slipped at home this evening hit his left wrist against a battery charger , presents with left wrist pain and swelling .

## 2014-12-29 ENCOUNTER — Encounter: Payer: Self-pay | Admitting: Family

## 2014-12-29 ENCOUNTER — Other Ambulatory Visit: Payer: Self-pay | Admitting: Vascular Surgery

## 2014-12-29 ENCOUNTER — Ambulatory Visit (INDEPENDENT_AMBULATORY_CARE_PROVIDER_SITE_OTHER)
Admission: RE | Admit: 2014-12-29 | Discharge: 2014-12-29 | Disposition: A | Discharge status: Home / Self Care | Payer: Medicare HMO | Source: Ambulatory Visit | Attending: Vascular Surgery | Admitting: Vascular Surgery

## 2014-12-29 ENCOUNTER — Ambulatory Visit (HOSPITAL_COMMUNITY)
Admission: RE | Admit: 2014-12-29 | Discharge: 2014-12-29 | Disposition: A | Discharge status: Home / Self Care | Payer: Medicare HMO | Source: Ambulatory Visit | Attending: Vascular Surgery | Admitting: Vascular Surgery

## 2014-12-29 ENCOUNTER — Ambulatory Visit (INDEPENDENT_AMBULATORY_CARE_PROVIDER_SITE_OTHER): Payer: Medicare HMO | Admitting: Family

## 2014-12-29 VITALS — BP 138/70 | HR 60 | Temp 97.1°F | Resp 16 | Ht 67.0 in | Wt 228.0 lb

## 2014-12-29 DIAGNOSIS — Z48812 Encounter for surgical aftercare following surgery on the circulatory system: Secondary | ICD-10-CM

## 2014-12-29 DIAGNOSIS — I714 Abdominal aortic aneurysm, without rupture, unspecified: Secondary | ICD-10-CM

## 2014-12-29 DIAGNOSIS — I6522 Occlusion and stenosis of left carotid artery: Secondary | ICD-10-CM

## 2014-12-29 DIAGNOSIS — Z95828 Presence of other vascular implants and grafts: Secondary | ICD-10-CM

## 2014-12-29 DIAGNOSIS — I6523 Occlusion and stenosis of bilateral carotid arteries: Secondary | ICD-10-CM

## 2014-12-29 NOTE — Progress Notes (Signed)
[facility] [place] HISTORY AND PHYSICAL   MRN : [id number]  History of Present Illness:   Dylan Morgan is a 69 y.o. male patient of Dr. Kellie Simmering who returns for continued followup regarding his aortic stent graft which was placed in 2010 for abdominal aortic aneurysm and his known left ICA occlusion. Previously he has had mild right ICA stenosis and he has been asymptomatic. 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 He has no active symptoms of lateralizing weakness, any aphasia, visual loss in the right eye. He has occasional numbness in both feet. But denies claudication symptoms. Pt states he had a TIA or stroke about 2014 as manifested by numbness on possibly left arm and leg, some slurred speech, states he was taken to [facility] ED and given and IV blood thinner. He has not had subsequent stroke or TIA. Dr. Kellie Simmering last saw pt on 12/23/13. At that time CT angiogram of the abdomen and pelvis were reviewed by computer and also carotid duplex exam reviewed. There was no evidence of endoleak. The aneurysm stent graft was in excellent position with a maximum diameter of 5.2 cm which was stable. Right ICA remained minimally stenotic with less than 40% stenosis with known left ICA occlusion.  Pt states his feet feel numb since about January 2016, is not getting worse, he denies any known history of DM. He does report known lumbar curvature problem, relieved by chiropractic care; states he also has known c-spine issues.  He has chronic low back pain, occassionally has left radiculopathy type pain.  He denies any new back pain, denies abdominal pain.  Pt states he bumped his left wrist and is wearing a wrist splint.  Both hands occasionally have tingling and numbness.  Pt Diabetic: No Pt smoker: former smoker, quit in 2011  Pt meds include: Statin :Yes Betablocker: No ASA: Yes Other anticoagulants/antiplatelets: coumadin, states he has  atrial fib  Current Outpatient Prescriptions  Medication Sig Dispense Refill  . aspirin EC 81 MG tablet Take 81 mg by mouth every other day. After supper    . lisinopril (PRINIVIL,ZESTRIL) 10 MG tablet Take 1 tablet (10 mg total) by mouth daily. 90 tablet 3  . pregabalin (LYRICA) 50 MG capsule Take 1 capsule (50 mg total) by mouth 3 (three) times daily. 90 capsule 1  . simvastatin (ZOCOR) 40 MG tablet Take 1 tablet (40 mg total) by mouth daily after supper. 90 tablet 3  . traMADol (ULTRAM) 50 MG tablet Take 1 tablet (50 mg total) by mouth every 6 (six) hours as needed. 15 tablet 0  . warfarin (COUMADIN) 5 MG tablet Take 1 tablet (5 mg total) by mouth as directed. 35 tablet 3   No current facility-administered medications for this visit.    Past Medical History  Diagnosis Date  . CAD (coronary artery disease) CABG in 2004    a. 2004 s/p CABG  . GERD (gastroesophageal reflux disease)   . Hypertension   . Hyperlipidemia   . Renal insufficiency   . Tobacco abuse   . Seborrheic dermatitis of scalp   . Bradycardia   . AAA (abdominal aortic aneurysm)     a. 2010 s/p stenting  . Paroxysmal atrial fibrillation 02/22/2009    a. on Coumadin   . Carotid artery disease     a. 2012 dopplers with old LICA occlusion , RICA no significant abnormality  . Arthritis   .  Prediabetes   . CVA (cerebral infarction)     a. 2013 R Carona radiata stroke     Social History Social History  Substance Use Topics  . Smoking status: Former Smoker -- 0.00 packs/day for 35 years    Types: Cigarettes    Quit date: 02/03/2010  . Smokeless tobacco: Never Used  . Alcohol Use: No    Family History Family History  Problem Relation Age of Onset  . Diabetes Mother   . Stroke Father   . Cancer Sister     thyroid cancer  . Heart disease Brother     Coronary artery disease    Surgical History Past Surgical History  Procedure Laterality Date  . Coronary artery bypass graft  2004  . Rotator cuff repair     . Abdominal aortic aneurysm repair  2010    Aortic stent repair    Allergies  Allergen Reactions  . Diltiazem Hcl Itching  . Oxycodone-Acetaminophen Other (See Comments)    vertigo    Current Outpatient Prescriptions  Medication Sig Dispense Refill  . aspirin EC 81 MG tablet Take 81 mg by mouth every other day. After supper    . lisinopril (PRINIVIL,ZESTRIL) 10 MG tablet Take 1 tablet (10 mg total) by mouth daily. 90 tablet 3  . pregabalin (LYRICA) 50 MG capsule Take 1 capsule (50 mg total) by mouth 3 (three) times daily. 90 capsule 1  . simvastatin (ZOCOR) 40 MG tablet Take 1 tablet (40 mg total) by mouth daily after supper. 90 tablet 3  . traMADol (ULTRAM) 50 MG tablet Take 1 tablet (50 mg total) by mouth every 6 (six) hours as needed. 15 tablet 0  . warfarin (COUMADIN) 5 MG tablet Take 1 tablet (5 mg total) by mouth as directed. 35 tablet 3   No current facility-administered medications for this visit.     REVIEW OF SYSTEMS: See HPI for pertinent positives and negatives.  Physical Examination Filed Vitals:   12/29/14 1130 12/29/14 1138  BP: 140/71 138/70  Pulse: 57 60  Temp: 97.1 F (36.2 C)   TempSrc: Oral   Resp: 16   Height: '5\' 7"'$  (1.702 m)   Weight: 228 lb (103.42 kg)   SpO2: 96%    Body mass index is 35.7 kg/(m^2).  General:  WDWN in obese male in NAD Gait: Normal HENT: WNL Eyes: Pupils equal Pulmonary: normal non-labored breathing , without rales, rhonchi, or wheezing Cardiac: RRR, no murmur detected  Abdomen: soft, NT, no masses palpated Skin: no rashes,no  ulcers;  no Gangrene , no cellulitis; no open wounds.   VASCULAR EXAM  Carotid Bruits Right Left   Negative Negative     Aorta is not palpable                         VASCULAR EXAM: Extremities without ischemic changes, without Gangrene; without open wounds.  Musculoskeletal: no muscle  wasting or atrophy; no peripheral edema. Left wrist slpint in place.  Neurologic: A&O X 3; Appropriate Affect ;  SENSATION: normal; MOTOR FUNCTION: 5/5 Symmetric, CN 2-12 intact Speech is fluent/normal   Non-Invasive Vascular Imaging (12/29/2014):  CEREBROVASCULAR DUPLEX EVALUATION    Carotid stenosis    PREVIOUS INTERVENTION(S): Known left ICA occlusion        RIGHT  LEFT  End Diastolic Velocities (cm/s) Plaque LOCATION Peak Systolic Velocities (cm/s) End Diastolic Velocities (cm/s) Plaque  25  CCA PROXIMAL 44 0   16  CCA MID 61 13   24 HT CCA DISTAL 34 7 HT  17 HT ECA 71 14 HT  60 HT ICA PROXIMAL 0 0 occluded  33  ICA MID 0 0 occluded  34  ICA DISTAL NA NA NA    2.5 ICA / CCA Ratio (PSV) ICA occlusion  Antegrade, Abnormal Vertebral Flow Antegrade  161 Brachial Systolic Pressure (mmHg) 096  Triphasic Brachial Artery Waveforms Triphasic    Plaque Morphology:  HM = Homogeneous, HT = Heterogeneous, CP = Calcific Plaque, SP = Smooth Plaque, IP = Irregular Plaque     ADDITIONAL FINDINGS: Right subclavian artery PSV143cm/sec; Left subclavian artery PSV116cm/sec    Right internal carotid artery stenosis present in the 40%-59% range. Left internal carotid artery presents with known occlusion. Abnormal right vertebral artery with blood pressure gradient present, however subclavian arteries are within normal limits.    Compared to the previous exam:  Increase in disease on the right and stable on the left since previous study on 12/12/2013.     ABDOMINAL AORTA DUPLEX EVALUATION - POST ENDOVASCULAR REPAIR    INDICATION: Abdominal aortic aneurysm    PREVIOUS INTERVENTION(S): Endovascular Aortic Repair performed 2010    DUPLEX EXAM:      DIAMETER AP (cm) DIAMETER TRANSVERSE (cm) VELOCITIES (cm/sec)  Aorta 5.64 5.13 122  Right Common Iliac 1.27 1.38 72  Left Common Iliac 1.34 1.34 78    Comparison Study  CT Scan     Date DIAMETER AP (cm) DIAMETER TRANSVERSE (cm)   12/12/2013 CT SCAN 5.2      ADDITIONAL FINDINGS:     IMPRESSION: Abdominal aortic sac present measuring 5.64cm AP x 5.13cm TRV, no extraluminal flow is observed.    Compared to the previous exam:  No previous ultrasound to compare, slight increase in diameter when compared to CT from 12/12/2013.      ASSESSMENT:  COBY SHREWSBERRY is a 69 y.o. male who is s/p endovascular Aortic Repair in 2010 and has a known left ICA occlusion. Pt reports a stroke or TIA in 2014 with no residual neurological deficits, no subsequent stroke or TIA.  Today's carotid duplex suggests 40%-59% right ICA stenosis and left internal carotid artery presents with known occlusion. ncrease in disease on the right and stable on the left since previous study on 12/12/2013.   Today's EVAR duplex suggests abdominal aortic sac measuring 5.64cm AP x 5.13cm TRV, no extraluminal flow is observed. Slight increase in diameter (5.64 cm) when compared to CT from 12/12/2013 (5.2 cm).   PLAN:   Based on today's exam and non-invasive vascular lab results, and after discussing with Dr. Kellie Simmering, the patient will follow up in 6 months with the following tests: CTA abd/pelvis, follow up with NP on a day that Dr. Kellie Simmering is in the office, not a vein clinic day. Return in 1 year with carotid duplex, see NP. I discussed in depth with the patient the nature  of atherosclerosis, and emphasized the importance of maximal medical management including strict control of blood pressure, blood glucose, and lipid levels, obtaining regular exercise, and cessation of smoking.  The patient is aware that without maximal medical management the underlying atherosclerotic disease process will progress, limiting the benefit of any interventions.  The patient was given information about stroke prevention and what symptoms should prompt the patient to seek immediate medical care. Thank you for allowing [place] to participate in this patient's care.  Clemon Chambers,  RN, MSN, FNP-C [facility] Office: [phone]  Clinic MD: Kellie Simmering  12/29/2014 11:39 AM

## 2014-12-29 NOTE — Progress Notes (Signed)
Filed Vitals:   12/29/14 1130 12/29/14 1138  BP: 140/71 138/70  Pulse: 57 60  Temp: 97.1 F (36.2 C)   TempSrc: Oral   Resp: 16   Height: '5\' 7"'$  (1.702 m)   Weight: 228 lb (103.42 kg)   SpO2: 96%

## 2014-12-29 NOTE — Patient Instructions (Signed)
Stroke Prevention Some medical conditions and behaviors are associated with an increased chance of having a stroke. You may prevent a stroke by making healthy choices and managing medical conditions. HOW CAN I REDUCE MY RISK OF HAVING A STROKE?   Stay physically active. Get at least 30 minutes of activity on most or all days.  Do not smoke. It may also be helpful to avoid exposure to secondhand smoke.  Limit alcohol use. Moderate alcohol use is considered to be:  No more than 2 drinks per day for men.  No more than 1 drink per day for nonpregnant women.  Eat healthy foods. This involves:  Eating 5 or more servings of fruits and vegetables a day.  Making dietary changes that address high blood pressure (hypertension), high cholesterol, diabetes, or obesity.  Manage your cholesterol levels.  Making food choices that are high in fiber and low in saturated fat, trans fat, and cholesterol may control cholesterol levels.  Take any prescribed medicines to control cholesterol as directed by your health care provider.  Manage your diabetes.  Controlling your carbohydrate and sugar intake is recommended to manage diabetes.  Take any prescribed medicines to control diabetes as directed by your health care provider.  Control your hypertension.  Making food choices that are low in salt (sodium), saturated fat, trans fat, and cholesterol is recommended to manage hypertension.  Take any prescribed medicines to control hypertension as directed by your health care provider.  Maintain a healthy weight.  Reducing calorie intake and making food choices that are low in sodium, saturated fat, trans fat, and cholesterol are recommended to manage weight.  Stop drug abuse.  Avoid taking birth control pills.  Talk to your health care provider about the risks of taking birth control pills if you are over 35 years old, smoke, get migraines, or have ever had a blood clot.  Get evaluated for sleep  disorders (sleep apnea).  Talk to your health care provider about getting a sleep evaluation if you snore a lot or have excessive sleepiness.  Take medicines only as directed by your health care provider.  For some people, aspirin or blood thinners (anticoagulants) are helpful in reducing the risk of forming abnormal blood clots that can lead to stroke. If you have the irregular heart rhythm of atrial fibrillation, you should be on a blood thinner unless there is a good reason you cannot take them.  Understand all your medicine instructions.  Make sure that other conditions (such as anemia or atherosclerosis) are addressed. SEEK IMMEDIATE MEDICAL CARE IF:   You have sudden weakness or numbness of the face, arm, or leg, especially on one side of the body.  Your face or eyelid droops to one side.  You have sudden confusion.  You have trouble speaking (aphasia) or understanding.  You have sudden trouble seeing in one or both eyes.  You have sudden trouble walking.  You have dizziness.  You have a loss of balance or coordination.  You have a sudden, severe headache with no known cause.  You have new chest pain or an irregular heartbeat. Any of these symptoms may represent a serious problem that is an emergency. Do not wait to see if the symptoms will go away. Get medical help at once. Call your local emergency services (911 in [place] Do not drive yourself to the hospital. Document Released: 04/27/2004 Document Revised: 08/04/2013 Document Reviewed: 09/20/2012 [employer] Patient Information 2015 [employer]. This information is not intended to replace advice given   to you by your health care provider. Make sure you discuss any questions you have with your health care provider.  

## 2015-01-01 ENCOUNTER — Ambulatory Visit (INDEPENDENT_AMBULATORY_CARE_PROVIDER_SITE_OTHER): Payer: Medicare HMO | Admitting: Internal Medicine

## 2015-01-01 ENCOUNTER — Encounter: Payer: Self-pay | Admitting: Internal Medicine

## 2015-01-01 VITALS — BP 110/66 | HR 73 | Temp 98.4°F | Ht 67.0 in | Wt 227.4 lb

## 2015-01-01 DIAGNOSIS — M25532 Pain in left wrist: Secondary | ICD-10-CM

## 2015-01-01 DIAGNOSIS — M25531 Pain in right wrist: Secondary | ICD-10-CM

## 2015-01-01 NOTE — Patient Instructions (Signed)
Apply vaseline to your hands.  Also for the wrist pain continue with the tramadol. Take it with meals.   We will try to the Lyrica approved, otherwise we will prescribe something else.

## 2015-01-01 NOTE — Progress Notes (Signed)
Medicine attending: Medical history, presenting problems, physical findings, and medications, reviewed with Dr Denton Brick and I concur with her evaluation and management plan.

## 2015-01-01 NOTE — Progress Notes (Signed)
Patient ID: Dylan Morgan, male   DOB: July 07, 1945, 69 y.o.   MRN: [id number]   Subjective:   Patient ID: Dylan Morgan male   DOB: [birth year] 69 y.o.   MRN: [id number]  HPI: Dylan Morgan is a 69 y.o. with PMH listed below. Presented today for Ed follow up, he was in the ED- 12/28/2014, for wrist pain after he hit his left wrist against a door knob. Xray done in the Ed were negative for fractures but showed- Healed and chronic fractures of the wrist with secondary osteoarthritis. He was prescribed tylenol and tramadol, and sent home. He is back today because he says he will like a something to hang his had over his neck- like a sling. He says tylenol helps a little with the pain. Pain is not getting worse. He says the tramadol relieves the pain but he hates the after taste he gets in the morning.  He is otherwise doing ok.   Past Medical History  Diagnosis Date  . CAD (coronary artery disease) CABG in 2004    a. 2004 s/p CABG  . GERD (gastroesophageal reflux disease)   . Hypertension   . Hyperlipidemia   . Renal insufficiency   . Tobacco abuse   . Seborrheic dermatitis of scalp   . Bradycardia   . AAA (abdominal aortic aneurysm)     a. 2010 s/p stenting  . Paroxysmal atrial fibrillation 02/22/2009    a. on Coumadin   . Carotid artery disease     a. 2012 dopplers with old LICA occlusion , RICA no significant abnormality  . Arthritis   . Prediabetes   . CVA (cerebral infarction)     a. 2013 R Carona radiata stroke    Current Outpatient Prescriptions  Medication Sig Dispense Refill  . aspirin EC 81 MG tablet Take 81 mg by mouth every other day. After supper    . lisinopril (PRINIVIL,ZESTRIL) 10 MG tablet Take 1 tablet (10 mg total) by mouth daily. 90 tablet 3  . pregabalin (LYRICA) 50 MG capsule Take 1 capsule (50 mg total) by mouth 3 (three) times daily. 90 capsule 1  . simvastatin (ZOCOR) 40 MG tablet Take 1 tablet (40 mg total) by mouth daily after supper. 90 tablet 3  . traMADol  (ULTRAM) 50 MG tablet Take 1 tablet (50 mg total) by mouth every 6 (six) hours as needed. 15 tablet 0  . warfarin (COUMADIN) 5 MG tablet Take 1 tablet (5 mg total) by mouth as directed. 35 tablet 3   No current facility-administered medications for this visit.   Family History  Problem Relation Age of Onset  . Diabetes Mother   . Stroke Father   . Cancer Sister     thyroid cancer  . Heart disease Brother     Coronary artery disease   Social History   Social History  . Marital Status: Single    Spouse Name: N/A  . Number of Children: N/A  . Years of Education: N/A   Occupational History  . disabled    Social History Main Topics  . Smoking status: Former Smoker -- 0.00 packs/day for 35 years    Types: Cigarettes    Quit date: 02/03/2010  . Smokeless tobacco: Never Used  . Alcohol Use: No  . Drug Use: No  . Sexual Activity: Not Asked   Other Topics Concern  . None   Social History Narrative   Lives with son and his significant other, denies being married. Has  3 daughters & a son.  Several grandchildren.    Divorced.   Regular exercise.   Prior to retirement, hung dry wall for a living.    Review of Systems: CONSTITUTIONAL- No Fever, weightloss, night sweat or change in appetite. SKIN- No Rash, colour changes or itching. HEAD- No Headache or dizziness. Mouth/throat- No Sorethroat, dentures, or bleeding gums. RESPIRATORY- No Cough or SOB. CARDIAC- No Palpitations,or chest pain. GI- No  vomiting, diarrhoea,  abd pain. URINARY- No Frequency, or dysuria. NEUROLOGIC- No Numbness, syncope, seizures or burning. [facility]- Denies depression or anxiety.  Objective:  Physical Exam: Filed Vitals:   01/01/15 1059  BP: 110/66  Pulse: 73  Temp: 98.4 F (36.9 C)  TempSrc: Oral  Height: '5\' 7"'$  (1.702 m)  Weight: 227 lb 6.4 oz (103.148 kg)  SpO2: 99%   GENERAL- alert, co-operative, appears as stated age, not in any distress. HEENT- Atraumatic, normocephalic, PERRL, EOMI,  oral mucosa appears moist neck supple. CARDIAC- Irregularly irregular, no murmurs, rubs or gallops. RESP- Moving equal volumes of air, and clear to auscultation bilaterally, no wheezes or crackles. ABDOMEN- Soft, nontender, bowel sounds present. BACK- Normal curvature of the spine, No tenderness along the vertebrae, no CVA tenderness. NEURO- No obvious Cr N abnormality, strenght upper and lower extremities- 5/5,  Gait- Normal. EXTREMITIES- pulse 2+, symmetric, no pedal edema. Left wrist- minimal swelling, patient says this has improved significantly, not tender to palpation- also improved, no warmth or redness, no bruise, normal range of motion. SKIN- Warm, dry, No rash or lesion. PSYCH- Normal mood and affect, appropriate thought content and speech.  Assessment & Plan:   The patient's case and plan of care was discussed with attending physician, Dr. Beryle Beams.  Wrist pain- Most likely due to trauma. Improved compared to Ed visit, where it was noted wrist was swollen, today there is very minimal swelling, which pt agrees has improved, also non tender. No suspicion for inflammatory disease. - Encouraged patient to take the tramadol with meals, or with something sweet to get rid of the after taste.   - Also reassured pt pain and swelling will gradually get better, to give it some rest.

## 2015-02-02 ENCOUNTER — Encounter: Payer: Self-pay | Admitting: Gastroenterology

## 2015-02-15 ENCOUNTER — Other Ambulatory Visit: Payer: Self-pay | Admitting: Cardiology

## 2015-02-16 ENCOUNTER — Ambulatory Visit (INDEPENDENT_AMBULATORY_CARE_PROVIDER_SITE_OTHER): Payer: Medicare HMO | Admitting: *Deleted

## 2015-02-16 DIAGNOSIS — I4891 Unspecified atrial fibrillation: Secondary | ICD-10-CM | POA: Diagnosis not present

## 2015-02-16 DIAGNOSIS — I480 Paroxysmal atrial fibrillation: Secondary | ICD-10-CM

## 2015-02-16 DIAGNOSIS — Z7901 Long term (current) use of anticoagulants: Secondary | ICD-10-CM | POA: Diagnosis not present

## 2015-02-16 DIAGNOSIS — Z5181 Encounter for therapeutic drug level monitoring: Secondary | ICD-10-CM | POA: Diagnosis not present

## 2015-02-16 DIAGNOSIS — I6310 Cerebral infarction due to embolism of unspecified precerebral artery: Secondary | ICD-10-CM

## 2015-02-16 DIAGNOSIS — I639 Cerebral infarction, unspecified: Secondary | ICD-10-CM

## 2015-02-16 LAB — POCT INR: INR: 2.9

## 2015-03-02 ENCOUNTER — Ambulatory Visit (INDEPENDENT_AMBULATORY_CARE_PROVIDER_SITE_OTHER): Payer: Medicare HMO | Admitting: Internal Medicine

## 2015-03-02 ENCOUNTER — Encounter: Payer: Self-pay | Admitting: Internal Medicine

## 2015-03-02 ENCOUNTER — Ambulatory Visit (HOSPITAL_COMMUNITY)
Admission: RE | Admit: 2015-03-02 | Discharge: 2015-03-02 | Disposition: A | Discharge status: Home / Self Care | Payer: Medicare HMO | Source: Ambulatory Visit | Attending: Internal Medicine | Admitting: Internal Medicine

## 2015-03-02 VITALS — BP 153/86 | HR 66 | Temp 97.5°F | Ht 67.0 in | Wt 236.9 lb

## 2015-03-02 DIAGNOSIS — Z0000 Encounter for general adult medical examination without abnormal findings: Secondary | ICD-10-CM | POA: Diagnosis not present

## 2015-03-02 DIAGNOSIS — G6289 Other specified polyneuropathies: Secondary | ICD-10-CM

## 2015-03-02 DIAGNOSIS — M79672 Pain in left foot: Secondary | ICD-10-CM

## 2015-03-02 DIAGNOSIS — I10 Essential (primary) hypertension: Secondary | ICD-10-CM | POA: Diagnosis not present

## 2015-03-02 MED ORDER — GABAPENTIN 300 MG PO CAPS: 300.0000 mg | ORAL_CAPSULE | Freq: Every day | ORAL | Status: DC

## 2015-03-02 MED ORDER — LISINOPRIL 20 MG PO TABS: 20.0000 mg | ORAL_TABLET | Freq: Every day | ORAL | Status: DC

## 2015-03-02 NOTE — Assessment & Plan Note (Signed)
-   Patient scheduled for colonoscopy in January. Will follow up results

## 2015-03-02 NOTE — Progress Notes (Signed)
   Subjective:    Patient ID: Dylan Morgan, male    DOB: 1945/04/13, 69 y.o.   MRN: [id number]  HPI 69 y/o male presents for routine follow up of his HTN. He states he is compliant with his meds. No HA, no chest pain, no sob, no syncope.  He also complains of intermittent burning of the soles of his feet which he attributes to a diagnosis of neuropathy. He had an EMG and NCS ordered on last visit but states he has not received an appointment for this. He states that the lyrica is not working but he has been taking walnuts on the advice of his daughter and this has helped.  He also complains of a sharp pain at the sole of his left foot near the calcaneus. It is constant and has been there over the last 4-8 weeks.   Review of Systems  Constitutional: Negative.   HENT: Negative.   Eyes: Negative.   Respiratory: Negative.   Cardiovascular: Negative.   Gastrointestinal: Negative.   Musculoskeletal: Negative.   Skin: Negative.   Neurological:       Burning over the soles of his feet  Psychiatric/Behavioral: Negative.        Objective:   Physical Exam  Constitutional: He is oriented to person, place, and time. He appears well-developed and well-nourished.  HENT:  Head: Normocephalic and atraumatic.  Neck: Normal range of motion. Neck supple.  Cardiovascular: Normal rate, regular rhythm and normal heart sounds.   Pulmonary/Chest: Effort normal and breath sounds normal. No respiratory distress. He has no wheezes.  Abdominal: Soft. Bowel sounds are normal. He exhibits no distension. There is no tenderness.  Musculoskeletal: Normal range of motion. He exhibits no edema or tenderness.  Lymphadenopathy:    He has no cervical adenopathy.  Neurological: He is alert and oriented to person, place, and time.  Skin: Skin is warm and dry.  Psychiatric: He has a normal mood and affect. His behavior is normal.          Assessment & Plan:  Please see problem based charting for assessment and  plan:

## 2015-03-02 NOTE — Assessment & Plan Note (Addendum)
-   Patient with sharp, intermittent left foot pain on the sole of his foot near his calcaneus - X ray left foot was unrevealing - Possibly plantar fascitis. Will reassess on next visit if persistent - Patient to contact us if worsens

## 2015-03-02 NOTE — Assessment & Plan Note (Signed)
-   Uncertain etiology of neuropathy - Past work up remains negative including ANA, B 12, HIV, diabetes - Patient to follow up for EMG/NCS. Would consider heavy metal screen and referral to neuro if wnl - Will d/c lyrica as has been ineffective and will start gabapentin and monitor

## 2015-03-02 NOTE — Patient Instructions (Signed)
-   It was a pleasure meeting you - We will increase your lisinopril today to 20 mg given your elevated BP - We will recheck your kidney function on your next visit - We will check an x ray of your left foot - We have stopped lyrica and started neurontin for the burning in your feet - Please call with any questions

## 2015-03-02 NOTE — Assessment & Plan Note (Signed)
BP Readings from Last 3 Encounters:  03/02/15 153/86  01/01/15 110/66  12/29/14 138/70    Lab Results  Component Value Date   NA 141 12/14/2014   K 4.9 12/14/2014   CREATININE 1.34* 12/14/2014    Assessment: Blood pressure control:  uncontrolled Progress toward BP goal:   deteriorated Comments: patient is compliant with his meds  Plan: Medications:  will increase lisinopril to 20 mg Educational resources provided:   Self management tools provided:   Other plans: recheck BMP on next visit

## 2015-03-16 ENCOUNTER — Ambulatory Visit (INDEPENDENT_AMBULATORY_CARE_PROVIDER_SITE_OTHER): Payer: Medicare HMO | Admitting: *Deleted

## 2015-03-16 DIAGNOSIS — I639 Cerebral infarction, unspecified: Secondary | ICD-10-CM

## 2015-03-16 DIAGNOSIS — Z7901 Long term (current) use of anticoagulants: Secondary | ICD-10-CM | POA: Diagnosis not present

## 2015-03-16 DIAGNOSIS — Z5181 Encounter for therapeutic drug level monitoring: Secondary | ICD-10-CM

## 2015-03-16 DIAGNOSIS — I480 Paroxysmal atrial fibrillation: Secondary | ICD-10-CM

## 2015-03-16 DIAGNOSIS — I4891 Unspecified atrial fibrillation: Secondary | ICD-10-CM | POA: Diagnosis not present

## 2015-03-16 DIAGNOSIS — I6310 Cerebral infarction due to embolism of unspecified precerebral artery: Secondary | ICD-10-CM

## 2015-03-16 LAB — POCT INR: INR: 4.1

## 2015-03-30 ENCOUNTER — Ambulatory Visit (INDEPENDENT_AMBULATORY_CARE_PROVIDER_SITE_OTHER): Payer: Medicare HMO | Admitting: Pharmacist

## 2015-03-30 DIAGNOSIS — I4891 Unspecified atrial fibrillation: Secondary | ICD-10-CM | POA: Diagnosis not present

## 2015-03-30 DIAGNOSIS — I480 Paroxysmal atrial fibrillation: Secondary | ICD-10-CM

## 2015-03-30 DIAGNOSIS — Z5181 Encounter for therapeutic drug level monitoring: Secondary | ICD-10-CM

## 2015-03-30 DIAGNOSIS — Z7901 Long term (current) use of anticoagulants: Secondary | ICD-10-CM | POA: Diagnosis not present

## 2015-03-30 DIAGNOSIS — I6310 Cerebral infarction due to embolism of unspecified precerebral artery: Secondary | ICD-10-CM

## 2015-03-30 DIAGNOSIS — I639 Cerebral infarction, unspecified: Secondary | ICD-10-CM | POA: Diagnosis not present

## 2015-03-30 LAB — POCT INR: INR: 3.4

## 2015-04-06 ENCOUNTER — Ambulatory Visit: Payer: Medicare HMO | Admitting: Internal Medicine

## 2015-04-08 ENCOUNTER — Emergency Department (HOSPITAL_COMMUNITY)
Admission: EM | Admit: 2015-04-08 | Discharge: 2015-04-08 | Disposition: A | Discharge status: Home / Self Care | Payer: Medicare HMO | Attending: Emergency Medicine | Admitting: Emergency Medicine

## 2015-04-08 ENCOUNTER — Encounter (HOSPITAL_COMMUNITY): Payer: Self-pay

## 2015-04-08 ENCOUNTER — Emergency Department (HOSPITAL_COMMUNITY): Payer: Medicare HMO

## 2015-04-08 DIAGNOSIS — Z8719 Personal history of other diseases of the digestive system: Secondary | ICD-10-CM | POA: Diagnosis not present

## 2015-04-08 DIAGNOSIS — Z87891 Personal history of nicotine dependence: Secondary | ICD-10-CM | POA: Diagnosis not present

## 2015-04-08 DIAGNOSIS — Y9289 Other specified places as the place of occurrence of the external cause: Secondary | ICD-10-CM | POA: Insufficient documentation

## 2015-04-08 DIAGNOSIS — W182XXA Fall in (into) shower or empty bathtub, initial encounter: Secondary | ICD-10-CM | POA: Diagnosis not present

## 2015-04-08 DIAGNOSIS — R0781 Pleurodynia: Secondary | ICD-10-CM

## 2015-04-08 DIAGNOSIS — Z872 Personal history of diseases of the skin and subcutaneous tissue: Secondary | ICD-10-CM | POA: Diagnosis not present

## 2015-04-08 DIAGNOSIS — M1990 Unspecified osteoarthritis, unspecified site: Secondary | ICD-10-CM | POA: Diagnosis not present

## 2015-04-08 DIAGNOSIS — I2510 Atherosclerotic heart disease of native coronary artery without angina pectoris: Secondary | ICD-10-CM | POA: Diagnosis not present

## 2015-04-08 DIAGNOSIS — I10 Essential (primary) hypertension: Secondary | ICD-10-CM | POA: Diagnosis not present

## 2015-04-08 DIAGNOSIS — Z7901 Long term (current) use of anticoagulants: Secondary | ICD-10-CM | POA: Insufficient documentation

## 2015-04-08 DIAGNOSIS — Z7982 Long term (current) use of aspirin: Secondary | ICD-10-CM | POA: Diagnosis not present

## 2015-04-08 DIAGNOSIS — Z87448 Personal history of other diseases of urinary system: Secondary | ICD-10-CM | POA: Insufficient documentation

## 2015-04-08 DIAGNOSIS — Z951 Presence of aortocoronary bypass graft: Secondary | ICD-10-CM | POA: Diagnosis not present

## 2015-04-08 DIAGNOSIS — Z79899 Other long term (current) drug therapy: Secondary | ICD-10-CM | POA: Diagnosis not present

## 2015-04-08 DIAGNOSIS — E785 Hyperlipidemia, unspecified: Secondary | ICD-10-CM | POA: Diagnosis not present

## 2015-04-08 DIAGNOSIS — Y998 Other external cause status: Secondary | ICD-10-CM | POA: Insufficient documentation

## 2015-04-08 DIAGNOSIS — W19XXXA Unspecified fall, initial encounter: Secondary | ICD-10-CM

## 2015-04-08 DIAGNOSIS — Z8673 Personal history of transient ischemic attack (TIA), and cerebral infarction without residual deficits: Secondary | ICD-10-CM | POA: Insufficient documentation

## 2015-04-08 DIAGNOSIS — S29001A Unspecified injury of muscle and tendon of front wall of thorax, initial encounter: Secondary | ICD-10-CM | POA: Diagnosis not present

## 2015-04-08 DIAGNOSIS — Y93E1 Activity, personal bathing and showering: Secondary | ICD-10-CM | POA: Insufficient documentation

## 2015-04-08 DIAGNOSIS — S299XXA Unspecified injury of thorax, initial encounter: Secondary | ICD-10-CM | POA: Diagnosis present

## 2015-04-08 MED ORDER — IBUPROFEN 800 MG PO TABS
800.0000 mg | ORAL_TABLET | Freq: Once | ORAL | Status: AC
Start: 2015-04-08 — End: 2015-04-08
  Administered 2015-04-08: 800 mg via ORAL
  Filled 2015-04-08: qty 1

## 2015-04-08 NOTE — ED Provider Notes (Signed)
Medical screening examination/treatment/procedure(s) were conducted as a shared visit with non-physician practitioner(s) and myself.  I personally evaluated the patient during the encounter.   EKG Interpretation None     Patient here to mechanical fall and striking the left posterior ribs. X-ray negative. Abdomen soft. Stable for discharge  Lacretia Leigh, MD 04/08/15 2321

## 2015-04-08 NOTE — ED Provider Notes (Signed)
CSN: [id number]     Arrival date & time 04/08/15  2200 History   First MD Initiated Contact with Patient 04/08/15 2205     Chief Complaint  Patient presents with  . Rib Injury   HPI  Dylan Morgan is a 70 year old male with PMHx CAD, GERD, HTN, PAF on coumadin and CVA presenting after a fall. He was in his shower when he reached for the grab bar and missed. He fell sideways onto the rim of the tub. He is reporting pain to his left lateral chest wall. Pain is described as sharp and is worse with palpation and coughing. Breathing does not worsen the pain. He took Tylenol at home which he reports good symptom control with. He denies hitting his head or loss of consciousness. Has no other pain complaints at this time. Denies SOB or cough. He takes Coumadin. He has no other complaints today. Denies headaches, dizziness, vision changes, neck pain, SOB, abdominal pain, nausea, vomiting or extremity pain.   Past Medical History  Diagnosis Date  . CAD (coronary artery disease) CABG in 2004    a. 2004 s/p CABG  . GERD (gastroesophageal reflux disease)   . Hypertension   . Hyperlipidemia   . Renal insufficiency   . Tobacco abuse   . Seborrheic dermatitis of scalp   . Bradycardia   . AAA (abdominal aortic aneurysm) ([place])     a. 2010 s/p stenting  . Paroxysmal atrial fibrillation ([place]) 02/22/2009    a. on Coumadin   . Carotid artery disease ([place])     a. 2012 dopplers with old LICA occlusion , RICA no significant abnormality  . Arthritis   . Prediabetes   . CVA (cerebral infarction)     a. 2013 R Carona radiata stroke    Past Surgical History  Procedure Laterality Date  . Coronary artery bypass graft  2004  . Rotator cuff repair    . Abdominal aortic aneurysm repair  2010    Aortic stent repair   Family History  Problem Relation Age of Onset  . Diabetes Mother   . Stroke Father   . Thyroid cancer Sister   . Heart disease Brother     Coronary artery disease   Social History  Substance Use  Topics  . Smoking status: Former Smoker -- 0.00 packs/day for 35 years    Types: Cigarettes    Quit date: 02/03/2010  . Smokeless tobacco: Never Used  . Alcohol Use: No    Review of Systems  Cardiovascular: Positive for chest pain (rib pain).  All other systems reviewed and are negative.     Allergies  Diltiazem hcl and Oxycodone-acetaminophen  Home Medications   Prior to Admission medications   Medication Sig Start Date End Date Taking? Authorizing Provider  aspirin EC 81 MG tablet Take 81 mg by mouth every other day. After supper   Yes Historical Provider, MD  gabapentin (NEURONTIN) 300 MG capsule Take 1 capsule (300 mg total) by mouth at bedtime. 03/02/15 03/01/16 Yes Nischal Narendra, MD  lisinopril (PRINIVIL,ZESTRIL) 20 MG tablet Take 1 tablet (20 mg total) by mouth daily. 03/02/15 03/01/16 Yes Nischal Narendra, MD  simvastatin (ZOCOR) 40 MG tablet Take 1 tablet (40 mg total) by mouth daily after supper. 06/22/14  Yes Liliane Shi, PA-C  warfarin (COUMADIN) 5 MG tablet TAKE ONE TABLET BY MOUTH AS DIRECTED 02/16/15  Yes Belva Crome, MD   BP 141/91 mmHg  Pulse 59  Temp(Src) 98 F (36.7  C) (Oral)  Resp 20  SpO2 99% Physical Exam  Constitutional: He is oriented to person, place, and time. He appears well-developed and well-nourished. No distress.  HENT:  Head: Normocephalic and atraumatic.  Eyes: Conjunctivae and EOM are normal. Pupils are equal, round, and reactive to light. Right eye exhibits no discharge. Left eye exhibits no discharge. No scleral icterus.  Neck: Normal range of motion. Neck supple.  No cervical spine tenderness. FROM of the neck without pain  Cardiovascular: Normal rate, regular rhythm and normal heart sounds.   Pulmonary/Chest: Effort normal and breath sounds normal. No respiratory distress. He has no wheezes.   He exhibits tenderness.    TTP over the left lateral chest wall as indicated in diagram. No bony deformities. No flail chest or  crepitus. Breathing unlabored.   Abdominal: Soft. There is no tenderness.  Musculoskeletal: Normal range of motion.  Moves all extremities spontaneously. No edema or deformity of the major joints  Neurological: He is alert and oriented to person, place, and time. No cranial nerve deficit. Coordination normal.  Cranial nerves 3-12 tested and intact. 5/5 strength in all major muscle groups. Sensation to light touch intact throughout.   Skin: Skin is warm and dry.  No ecchymosis, erythema, laceration or abrasion over left lateral chest wall  Psychiatric: He has a normal mood and affect. His behavior is normal.  Nursing note and vitals reviewed.   ED Course  Procedures (including critical care time) Labs Review Labs Reviewed - No data to display  Imaging Review Dg Chest 2 View  04/08/2015  CLINICAL DATA:  Left-sided rib pain and tenderness after fall in shower today. Cough. EXAM: CHEST  2 VIEW COMPARISON:  06/10/2014 FINDINGS: Patient is post median sternotomy. Cardiomediastinal contours are tortuosity of the thoracic aorta, unchanged. No pneumothorax, large pleural effusion or consolidation. No displaced rib fractures are seen. Abdominal aortic stent graft, partially included. IMPRESSION: No acute process.  No displaced rib fracture is seen. Electronically Signed   By: Jeb Levering M.D.   On: 04/08/2015 22:58   I have personally reviewed and evaluated these images and lab results as part of my medical decision-making.   EKG Interpretation None      MDM   Final diagnoses:  Fall, initial encounter  Rib pain on left side   70 year old male presenting after a mechanical fall complaining of left lateral rib pain. Pt is on coumadin. No other complaints today. No head injury or LOC. VSS. Lungs CTAB. TTP over the left lateral and posterior ribs. No bony deformities. No ecchymosis, abrasion or wounds over left chest wall. Non-focal neuro exam. Pt moves all extremities spontaneously without  pain. CXR negative for acute injury. Instructed to use tylenol, heat and/or ice at home for pain control. Return precautions given in discharge paperwork and discussed with pt at bedside. Pt stable for discharge     Josephina Gip, PA-C 04/08/15 2329  Lacretia Leigh, MD 04/14/15 [phone]

## 2015-04-08 NOTE — ED Notes (Signed)
Bed: [facility] Expected date:  Expected time:  Means of arrival:  Comments: EMS 70yo M fall in shower; left sided pain / rib pain

## 2015-04-08 NOTE — ED Notes (Signed)
Per EMS, pt c/o left sided rib pain and tenderness after falling in shower. No LOC, no injury to head. Currently taking blood thinners.

## 2015-04-08 NOTE — Discharge Instructions (Signed)
Take tylenol for pain control. Heating pad or ice may help with your rib pain. Return to ED with worsening pain, SOB, dizziness, passing out or any other new, worsening or concerning symptoms   Chest Wall Pain Chest wall pain is pain in or around the bones and muscles of your chest. Sometimes, an injury causes this pain. Sometimes, the cause may not be known. This pain may take several weeks or longer to get better. HOME CARE INSTRUCTIONS  Pay attention to any changes in your symptoms. Take these actions to help with your pain:   Rest as told by your health care provider.   Avoid activities that cause pain. These include any activities that use your chest muscles or your abdominal and side muscles to lift heavy items.   If directed, apply ice to the painful area:  Put ice in a plastic bag.  Place a towel between your skin and the bag.  Leave the ice on for 20 minutes, 2-3 times per day.  Take over-the-counter and prescription medicines only as told by your health care provider.  Do not use tobacco products, including cigarettes, chewing tobacco, and e-cigarettes. If you need help quitting, ask your health care provider.  Keep all follow-up visits as told by your health care provider. This is important. SEEK MEDICAL CARE IF:  You have a fever.  Your chest pain becomes worse.  You have new symptoms. SEEK IMMEDIATE MEDICAL CARE IF:  You have nausea or vomiting.  You feel sweaty or light-headed.  You have a cough with phlegm (sputum) or you cough up blood.  You develop shortness of breath.   This information is not intended to replace advice given to you by your health care provider. Make sure you discuss any questions you have with your health care provider.   Document Released: 03/20/2005 Document Revised: 12/09/2014 Document Reviewed: 06/15/2014 Elsevier Interactive Patient Education [employer].

## 2015-04-16 ENCOUNTER — Ambulatory Visit (INDEPENDENT_AMBULATORY_CARE_PROVIDER_SITE_OTHER): Payer: Medicare HMO | Admitting: *Deleted

## 2015-04-16 DIAGNOSIS — Z7901 Long term (current) use of anticoagulants: Secondary | ICD-10-CM | POA: Diagnosis not present

## 2015-04-16 DIAGNOSIS — I639 Cerebral infarction, unspecified: Secondary | ICD-10-CM

## 2015-04-16 DIAGNOSIS — I4891 Unspecified atrial fibrillation: Secondary | ICD-10-CM | POA: Diagnosis not present

## 2015-04-16 DIAGNOSIS — I480 Paroxysmal atrial fibrillation: Secondary | ICD-10-CM | POA: Diagnosis not present

## 2015-04-16 DIAGNOSIS — Z5181 Encounter for therapeutic drug level monitoring: Secondary | ICD-10-CM

## 2015-04-16 DIAGNOSIS — I6310 Cerebral infarction due to embolism of unspecified precerebral artery: Secondary | ICD-10-CM

## 2015-04-16 LAB — POCT INR: INR: 2.8

## 2015-04-24 ENCOUNTER — Emergency Department (HOSPITAL_COMMUNITY): Payer: Medicare HMO

## 2015-04-24 ENCOUNTER — Emergency Department (HOSPITAL_COMMUNITY)
Admission: EM | Admit: 2015-04-24 | Discharge: 2015-04-24 | Disposition: A | Discharge status: Home / Self Care | Payer: Medicare HMO | Attending: Emergency Medicine | Admitting: Emergency Medicine

## 2015-04-24 ENCOUNTER — Encounter (HOSPITAL_COMMUNITY): Payer: Self-pay | Admitting: Oncology

## 2015-04-24 DIAGNOSIS — Z7901 Long term (current) use of anticoagulants: Secondary | ICD-10-CM | POA: Insufficient documentation

## 2015-04-24 DIAGNOSIS — D689 Coagulation defect, unspecified: Secondary | ICD-10-CM

## 2015-04-24 DIAGNOSIS — M545 Low back pain, unspecified: Secondary | ICD-10-CM

## 2015-04-24 DIAGNOSIS — I2510 Atherosclerotic heart disease of native coronary artery without angina pectoris: Secondary | ICD-10-CM | POA: Diagnosis not present

## 2015-04-24 DIAGNOSIS — Z8719 Personal history of other diseases of the digestive system: Secondary | ICD-10-CM | POA: Insufficient documentation

## 2015-04-24 DIAGNOSIS — Z87448 Personal history of other diseases of urinary system: Secondary | ICD-10-CM | POA: Diagnosis not present

## 2015-04-24 DIAGNOSIS — I10 Essential (primary) hypertension: Secondary | ICD-10-CM | POA: Diagnosis not present

## 2015-04-24 DIAGNOSIS — E785 Hyperlipidemia, unspecified: Secondary | ICD-10-CM | POA: Diagnosis not present

## 2015-04-24 DIAGNOSIS — Z7982 Long term (current) use of aspirin: Secondary | ICD-10-CM | POA: Insufficient documentation

## 2015-04-24 DIAGNOSIS — Z8673 Personal history of transient ischemic attack (TIA), and cerebral infarction without residual deficits: Secondary | ICD-10-CM | POA: Insufficient documentation

## 2015-04-24 DIAGNOSIS — Z79899 Other long term (current) drug therapy: Secondary | ICD-10-CM | POA: Diagnosis not present

## 2015-04-24 DIAGNOSIS — Z87891 Personal history of nicotine dependence: Secondary | ICD-10-CM | POA: Diagnosis not present

## 2015-04-24 DIAGNOSIS — M79604 Pain in right leg: Secondary | ICD-10-CM | POA: Diagnosis present

## 2015-04-24 DIAGNOSIS — Z872 Personal history of diseases of the skin and subcutaneous tissue: Secondary | ICD-10-CM | POA: Diagnosis not present

## 2015-04-24 DIAGNOSIS — G8929 Other chronic pain: Secondary | ICD-10-CM | POA: Diagnosis not present

## 2015-04-24 DIAGNOSIS — M1990 Unspecified osteoarthritis, unspecified site: Secondary | ICD-10-CM | POA: Insufficient documentation

## 2015-04-24 LAB — PROTIME-INR
INR: 2.41 — ABNORMAL HIGH (ref 0.00–1.49)
Prothrombin Time: 26 seconds — ABNORMAL HIGH (ref 11.6–15.2)

## 2015-04-24 MED ORDER — HYDROCODONE-ACETAMINOPHEN 5-325 MG PO TABS: 1.0000 | ORAL_TABLET | ORAL | Status: DC | PRN

## 2015-04-24 NOTE — Discharge Instructions (Signed)
Back Pain, Adult °Back pain is very common in adults. The cause of back pain is rarely dangerous and the pain often gets better over time. The cause of your back pain may not be known. Some common causes of back pain include: °· Strain of the muscles or ligaments supporting the spine. °· Wear and tear (degeneration) of the spinal disks. °· Arthritis. °· Direct injury to the back. °For many people, back pain may return. Since back pain is rarely dangerous, most people can learn to manage this condition on their own. °HOME CARE INSTRUCTIONS °Watch your back pain for any changes. The following actions may help to lessen any discomfort you are feeling: °· Remain active. It is stressful on your back to sit or stand in one place for long periods of time. Do not sit, drive, or stand in one place for more than 30 minutes at a time. Take short walks on even surfaces as soon as you are able. Try to increase the length of time you walk each day. °· Exercise regularly as directed by your health care provider. Exercise helps your back heal faster. It also helps avoid future injury by keeping your muscles strong and flexible. °· Do not stay in bed. Resting more than 1-2 days can delay your recovery. °· Pay attention to your body when you bend and lift. The most comfortable positions are those that put less stress on your recovering back. Always use proper lifting techniques, including: °· Bending your knees. °· Keeping the load close to your body. °· Avoiding twisting. °· Find a comfortable position to sleep. Use a firm mattress and lie on your side with your knees slightly bent. If you lie on your back, put a pillow under your knees. °· Avoid feeling anxious or stressed. Stress increases muscle tension and can worsen back pain. It is important to recognize when you are anxious or stressed and learn ways to manage it, such as with exercise. °· Take medicines only as directed by your health care provider. Over-the-counter  medicines to reduce pain and inflammation are often the most helpful. Your health care provider may prescribe muscle relaxant drugs. These medicines help dull your pain so you can more quickly return to your normal activities and healthy exercise. °· Apply ice to the injured area: °· Put ice in a plastic bag. °· Place a towel between your skin and the bag. °· Leave the ice on for 20 minutes, 2-3 times a day for the first 2-3 days. After that, ice and heat may be alternated to reduce pain and spasms. °· Maintain a healthy weight. Excess weight puts extra stress on your back and makes it difficult to maintain good posture. °SEEK MEDICAL CARE IF: °· You have pain that is not relieved with rest or medicine. °· You have increasing pain going down into the legs or buttocks. °· You have pain that does not improve in one week. °· You have night pain. °· You lose weight. °· You have a fever or chills. °SEEK IMMEDIATE MEDICAL CARE IF:  °· You develop new bowel or bladder control problems. °· You have unusual weakness or numbness in your arms or legs. °· You develop nausea or vomiting. °· You develop abdominal pain. °· You feel faint. °  °This information is not intended to replace advice given to you by your health care provider. Make sure you discuss any questions you have with your health care provider. °  °Document Released: 03/20/2005 Document Revised: 04/10/2014 Document Reviewed: 07/22/2013 °Elsevier Interactive Patient Education ©2016 Elsevier   Inc. Heat Therapy Heat therapy can help ease sore, stiff, injured, and tight muscles and joints. Heat relaxes your muscles, which may help ease your pain.  RISKS AND COMPLICATIONS If you have any of the following conditions, do not use heat therapy unless your health care provider has approved:  Poor circulation.  Healing wounds or scarred skin in the area being treated.  Diabetes, heart disease, or high blood pressure.  Not being able to feel (numbness) the area  being treated.  Unusual swelling of the area being treated.  Active infections.  Blood clots.  Cancer.  Inability to communicate pain. This may include young children and people who have problems with their brain function (dementia).  Pregnancy. Heat therapy should only be used on old, pre-existing, or long-lasting (chronic) injuries. Do not use heat therapy on new injuries unless directed by your health care provider. HOW TO USE HEAT THERAPY There are several different kinds of heat therapy, including:  Moist heat pack.  Warm water bath.  Hot water bottle.  Electric heating pad.  Heated gel pack.  Heated wrap.  Electric heating pad. Use the heat therapy method suggested by your health care provider. Follow your health care provider's instructions on when and how to use heat therapy. GENERAL HEAT THERAPY RECOMMENDATIONS  Do not sleep while using heat therapy. Only use heat therapy while you are awake.  Your skin may turn pink while using heat therapy. Do not use heat therapy if your skin turns red.  Do not use heat therapy if you have new pain.  High heat or long exposure to heat can cause burns. Be careful when using heat therapy to avoid burning your skin.  Do not use heat therapy on areas of your skin that are already irritated, such as with a rash or sunburn. SEEK MEDICAL CARE IF:  You have blisters, redness, swelling, or numbness.  You have new pain.  Your pain is worse. MAKE SURE YOU:  Understand these instructions.  Will watch your condition.  Will get help right away if you are not doing well or get worse.   This information is not intended to replace advice given to you by your health care provider. Make sure you discuss any questions you have with your health care provider.   Document Released: 06/12/2011 Document Revised: 04/10/2014 Document Reviewed: 05/13/2013 Elsevier Interactive Patient Education [employer].

## 2015-04-24 NOTE — ED Notes (Signed)
Pt presents right leg x 1 day.  Denies injury/truama to extremity.  Pt also c/o chronic back pain.

## 2015-04-24 NOTE — ED Notes (Signed)
Pt ambulatory to exam room with steady gait.  

## 2015-04-24 NOTE — ED Provider Notes (Signed)
  Face-to-face evaluation   History: He is here for evaluation of right leg pain noted with bruising, today. Head injury several weeks ago but none since. At that time he was evaluated for low back injury with plain images. He is able ambulate and came here by private vehicle today.  Physical exam: Alert, cooperative, calm. Right leg with ecchymosis, above and on ankle, with localized swelling. Both lower legs are swollen. Right calf is tender. Left leg is nontender.  Medical screening examination/treatment/procedure(s) were conducted as a shared visit with non-physician practitioner(s) and myself.  I personally evaluated the patient during the encounter  Daleen Bo, MD 04/24/15 2337

## 2015-04-24 NOTE — ED Provider Notes (Signed)
CSN: [id number]     Arrival date & time 04/24/15  1945 History  By signing my name below, I, Meriel Pica, attest that this documentation has been prepared under the direction and in the presence of Charlann Lange, PA-C.  Electronically Signed: Meriel Pica, ED Scribe. 04/24/2015. 9:17 PM.  Chief Complaint  Patient presents with  . Leg Pain   The history is provided by the patient. No language interpreter was used.   HPI Comments: Dylan Morgan is a 70 y.o. male, with a PMhx of arthritis,CAD, CVA and Afib on coumadin therapy, who presents to the Emergency Department complaining of sudden onset, constant, moderate pain to anterior right lower leg onset last night without injury or trauma. There is erythema and ecchymosis to anterior right lower leg noted. He also reports worsening of his chronic left-sided lower back pain last night with onset of right leg pain. The pt was seen in the ED 10 days ago s/p fall but he denies injuring his right leg or back at that time.The pt is taking Coumadin with last INR being drawn last week when no adjustments were made. He has taking 2 tylenol tablets today without signigicant relief. Pt denies abdominal pain, loss of bladder or bowels, any urinary symptoms, h/o PE or DVT, pain to right calf, chest pain, or SOB. Pt ambulatory without difficulty.   Past Medical History  Diagnosis Date  . CAD (coronary artery disease) CABG in 2004    a. 2004 s/p CABG  . GERD (gastroesophageal reflux disease)   . Hypertension   . Hyperlipidemia   . Renal insufficiency   . Tobacco abuse   . Seborrheic dermatitis of scalp   . Bradycardia   . AAA (abdominal aortic aneurysm) ([place])     a. 2010 s/p stenting  . Paroxysmal atrial fibrillation ([place]) 02/22/2009    a. on Coumadin   . Carotid artery disease ([place])     a. 2012 dopplers with old LICA occlusion , RICA no significant abnormality  . Arthritis   . Prediabetes   . CVA (cerebral infarction)     a. 2013 R Carona  radiata stroke    Past Surgical History  Procedure Laterality Date  . Coronary artery bypass graft  2004  . Rotator cuff repair    . Abdominal aortic aneurysm repair  2010    Aortic stent repair   Family History  Problem Relation Age of Onset  . Diabetes Mother   . Stroke Father   . Thyroid cancer Sister   . Heart disease Brother     Coronary artery disease   Social History  Substance Use Topics  . Smoking status: Former Smoker -- 0.00 packs/day for 35 years    Types: Cigarettes    Quit date: 02/03/2010  . Smokeless tobacco: Never Used  . Alcohol Use: No    Review of Systems  Constitutional: Negative for fever and chills.  Respiratory: Negative for shortness of breath.   Cardiovascular: Negative for chest pain.  Gastrointestinal: Negative for abdominal pain.  Genitourinary: Negative for dysuria, urgency, frequency and hematuria.  Musculoskeletal: Positive for back pain and arthralgias ( right lower leg). Negative for gait problem.  Skin: Positive for color change ( erythema and ecchymosis right anterior lower leg ).   Allergies  Diltiazem hcl and Oxycodone-acetaminophen  Home Medications   Prior to Admission medications   Medication Sig Start Date End Date Taking? Authorizing Provider  aspirin EC 81 MG tablet Take 81 mg by mouth every other  day. After supper    Historical Provider, MD  gabapentin (NEURONTIN) 300 MG capsule Take 1 capsule (300 mg total) by mouth at bedtime. 03/02/15 03/01/16  Nischal Narendra, MD  lisinopril (PRINIVIL,ZESTRIL) 20 MG tablet Take 1 tablet (20 mg total) by mouth daily. 03/02/15 03/01/16  Nischal Narendra, MD  simvastatin (ZOCOR) 40 MG tablet Take 1 tablet (40 mg total) by mouth daily after supper. 06/22/14   Liliane Shi, PA-C  warfarin (COUMADIN) 5 MG tablet TAKE ONE TABLET BY MOUTH AS DIRECTED 02/16/15   Belva Crome, MD   BP 152/75 mmHg  Pulse 66  Temp(Src) 98.1 F (36.7 C) (Oral)  Resp 18  SpO2 95% Physical Exam   Constitutional: He is oriented to person, place, and time. He appears well-developed and well-nourished. No distress.  HENT:  Head: Normocephalic.  Eyes: Conjunctivae are normal.  Neck: Normal range of motion. Neck supple.  Cardiovascular: Normal rate and intact distal pulses.   Pulmonary/Chest: Effort normal. No respiratory distress.  Abdominal: There is no tenderness.  Musculoskeletal: Normal range of motion.  Focal tenderness to left paralumbar back without swelling. No midline tenderness. Right LE ecchymotic distally. Bilateral pitting. Tender anterior lower leg without calf tenderness.   Neurological: He is alert and oriented to person, place, and time. Coordination normal.  Skin: Skin is warm.  Psychiatric: He has a normal mood and affect. His behavior is normal.  Nursing note and vitals reviewed.   ED Course  Procedures  DIAGNOSTIC STUDIES: Oxygen Saturation is 95% on RA, adequate by my interpretation.    COORDINATION OF CARE: 8:57 PM Discussed treatment plan which includes to consult with attending Dr. Eulis Foster with pt. Will order Xray of right tib/fib.  Pt acknowledges and agrees to plan.   Lab Review Results for orders placed or performed during the hospital encounter of 04/24/15  Protime-INR  Result Value Ref Range   Prothrombin Time 26.0 (H) 11.6 - 15.2 seconds   INR 2.41 (H) 0.00 - 1.49    Imaging Review Dg Tibia/fibula Right  04/24/2015  CLINICAL DATA:  70 year old male with right lower extremity pain for 2 days, fell in the shower 1 week ago. Initial encounter. EXAM: RIGHT TIBIA AND FIBULA - 2 VIEW COMPARISON:  Right knee series [id number] FINDINGS: Chronic postoperative surgical clips throughout the medial visible right lower extremity. Alignment about the right knee and ankle appears grossly preserved. No acute fracture or dislocation of the right tibia or fibula identified; longitudinal tibia vascular channel noted on the frontal view. Chondrocalcinosis and other  degenerative changes at the right knee. Calcified peripheral vascular disease. No subcutaneous gas. IMPRESSION: No acute fracture or dislocation identified about the right tib-fib. Electronically Signed   By: Genevie Ann M.D.   On: 04/24/2015 21:41   I have personally reviewed and evaluated these images and lab results as part of my medical decision-making.   MDM   Final diagnoses:  None    1. Musculoskeletal back pain 2. Right LE pain 3. Coagulopathy  The patient's back pain is felt to be muscular - reproducible, worse with movement/position. LE pain: imaging negative for fracture. Consider DVT and will have doppler done in am. Anticoagulated and therapeutic - no Lovenox provided. Patient understands d/c instructions and plan of care. #6 Norco provided for pain.   I personally performed the services described in this documentation, which was scribed in my presence. The recorded information has been reviewed and is accurate.     Charlann Lange, PA-C 04/24/15 2238  Daleen Bo, MD 04/24/15 2337

## 2015-04-24 NOTE — ED Notes (Signed)
Patient transported to X-ray 

## 2015-04-25 ENCOUNTER — Encounter (HOSPITAL_COMMUNITY): Payer: Self-pay | Admitting: Emergency Medicine

## 2015-04-25 ENCOUNTER — Emergency Department (EMERGENCY_DEPARTMENT_HOSPITAL)
Admit: 2015-04-25 | Discharge: 2015-04-25 | Disposition: A | Discharge status: Home / Self Care | Payer: Medicare HMO | Attending: Emergency Medicine | Admitting: Emergency Medicine

## 2015-04-25 ENCOUNTER — Emergency Department (HOSPITAL_COMMUNITY)
Admission: EM | Admit: 2015-04-25 | Discharge: 2015-04-25 | Disposition: A | Discharge status: Home / Self Care | Payer: Medicare HMO | Attending: Emergency Medicine | Admitting: Emergency Medicine

## 2015-04-25 DIAGNOSIS — M79609 Pain in unspecified limb: Secondary | ICD-10-CM | POA: Diagnosis not present

## 2015-04-25 DIAGNOSIS — Z951 Presence of aortocoronary bypass graft: Secondary | ICD-10-CM | POA: Diagnosis not present

## 2015-04-25 DIAGNOSIS — G8929 Other chronic pain: Secondary | ICD-10-CM | POA: Diagnosis not present

## 2015-04-25 DIAGNOSIS — Z7982 Long term (current) use of aspirin: Secondary | ICD-10-CM | POA: Diagnosis not present

## 2015-04-25 DIAGNOSIS — M545 Low back pain: Secondary | ICD-10-CM | POA: Insufficient documentation

## 2015-04-25 DIAGNOSIS — Z7901 Long term (current) use of anticoagulants: Secondary | ICD-10-CM | POA: Diagnosis not present

## 2015-04-25 DIAGNOSIS — Z872 Personal history of diseases of the skin and subcutaneous tissue: Secondary | ICD-10-CM | POA: Diagnosis not present

## 2015-04-25 DIAGNOSIS — M1990 Unspecified osteoarthritis, unspecified site: Secondary | ICD-10-CM | POA: Insufficient documentation

## 2015-04-25 DIAGNOSIS — Z87448 Personal history of other diseases of urinary system: Secondary | ICD-10-CM | POA: Diagnosis not present

## 2015-04-25 DIAGNOSIS — M79604 Pain in right leg: Secondary | ICD-10-CM | POA: Diagnosis present

## 2015-04-25 DIAGNOSIS — Z8719 Personal history of other diseases of the digestive system: Secondary | ICD-10-CM | POA: Insufficient documentation

## 2015-04-25 DIAGNOSIS — Z79899 Other long term (current) drug therapy: Secondary | ICD-10-CM | POA: Diagnosis not present

## 2015-04-25 DIAGNOSIS — Z8673 Personal history of transient ischemic attack (TIA), and cerebral infarction without residual deficits: Secondary | ICD-10-CM | POA: Insufficient documentation

## 2015-04-25 DIAGNOSIS — I10 Essential (primary) hypertension: Secondary | ICD-10-CM | POA: Diagnosis not present

## 2015-04-25 DIAGNOSIS — E785 Hyperlipidemia, unspecified: Secondary | ICD-10-CM | POA: Insufficient documentation

## 2015-04-25 DIAGNOSIS — W1839XD Other fall on same level, subsequent encounter: Secondary | ICD-10-CM | POA: Diagnosis not present

## 2015-04-25 DIAGNOSIS — S8011XD Contusion of right lower leg, subsequent encounter: Secondary | ICD-10-CM | POA: Insufficient documentation

## 2015-04-25 DIAGNOSIS — I2510 Atherosclerotic heart disease of native coronary artery without angina pectoris: Secondary | ICD-10-CM | POA: Diagnosis not present

## 2015-04-25 DIAGNOSIS — I480 Paroxysmal atrial fibrillation: Secondary | ICD-10-CM | POA: Diagnosis not present

## 2015-04-25 DIAGNOSIS — Z87891 Personal history of nicotine dependence: Secondary | ICD-10-CM | POA: Insufficient documentation

## 2015-04-25 MED ORDER — ACETAMINOPHEN 325 MG PO TABS
650.0000 mg | ORAL_TABLET | Freq: Once | ORAL | Status: AC
  Administered 2015-04-25: 650 mg via ORAL
  Filled 2015-04-25: qty 2

## 2015-04-25 NOTE — Progress Notes (Signed)
VASCULAR LAB PRELIMINARY  PRELIMINARY  PRELIMINARY  PRELIMINARY  Right lower extremity venous duplex completed.    Preliminary report:  There is no DVT or SVT noted in the right lower extremity.  There is an enlarged lymph node noted in the right groin.  Kegan Shepardson, RVT 04/25/2015, 10:19 AM

## 2015-04-25 NOTE — ED Notes (Signed)
Pt reports that he fell on the 2nd of this month injuring his back. Yesterday pt began having right leg pain with bruising to right ankle. Pt seen at [facility] yesterday for same and was given prescription for pain medication but pt did not get it filled.

## 2015-04-25 NOTE — ED Provider Notes (Signed)
CSN: [id number]     Arrival date & time 04/25/15  7829 History   First MD Initiated Contact with Patient 04/25/15 [phone]     Chief Complaint  Patient presents with  . Leg Pain  . Back Pain     (Consider location/radiation/quality/duration/timing/severity/associated sxs/prior Treatment) HPI Patient seen at [facility] emergency department yesterday for complaint of right lower leg pain started yesterday. Pain is constant. No other associated symptoms. Sent here today for further evaluation and venous Doppler to check for DVT. He had INR performed yesterday which was 2.43, therapeutic also had x-ray of right tib-fib performed which was negative for fracture. He was prescribed Norco, which he has not filled yet. Patient reports that he stumbled and fell 04/05/2015 however had no pain until yesterday. He does suffer from chronic back pain, for many years which is worse with changing position and improved with remaining still. . Past Medical History  Diagnosis Date  . CAD (coronary artery disease) CABG in 2004    a. 2004 s/p CABG  . GERD (gastroesophageal reflux disease)   . Hypertension   . Hyperlipidemia   . Renal insufficiency   . Tobacco abuse   . Seborrheic dermatitis of scalp   . Bradycardia   . AAA (abdominal aortic aneurysm) ([place])     a. 2010 s/p stenting  . Paroxysmal atrial fibrillation ([place]) 02/22/2009    a. on Coumadin   . Carotid artery disease ([place])     a. 2012 dopplers with old LICA occlusion , RICA no significant abnormality  . Arthritis   . Prediabetes   . CVA (cerebral infarction)     a. 2013 R Carona radiata stroke    Past Surgical History  Procedure Laterality Date  . Coronary artery bypass graft  2004  . Rotator cuff repair    . Abdominal aortic aneurysm repair  2010    Aortic stent repair   Family History  Problem Relation Age of Onset  . Diabetes Mother   . Stroke Father   . Thyroid cancer Sister   . Heart disease Brother     Coronary artery disease    Social History  Substance Use Topics  . Smoking status: Former Smoker -- 0.00 packs/day for 35 years    Types: Cigarettes    Quit date: 02/03/2010  . Smokeless tobacco: Never Used  . Alcohol Use: No    Review of Systems  Constitutional: Negative.   HENT: Negative.   Respiratory: Negative.   Cardiovascular: Negative.   Gastrointestinal: Negative.   Musculoskeletal: Positive for myalgias and back pain.       Chronic back pain, right leg pain  Skin: Negative.   Neurological: Negative.   Hematological: Bruises/bleeds easily.  Psychiatric/Behavioral: Negative.   All other systems reviewed and are negative.     Allergies  Diltiazem hcl and Oxycodone-acetaminophen  Home Medications   Prior to Admission medications   Medication Sig Start Date End Date Taking? Authorizing Provider  aspirin EC 81 MG tablet Take 81 mg by mouth every other day. After supper   Yes Historical Provider, MD  lisinopril (PRINIVIL,ZESTRIL) 20 MG tablet Take 1 tablet (20 mg total) by mouth daily. 03/02/15 03/01/16 Yes Nischal Narendra, MD  simvastatin (ZOCOR) 40 MG tablet Take 1 tablet (40 mg total) by mouth daily after supper. 06/22/14  Yes Liliane Shi, PA-C  warfarin (COUMADIN) 5 MG tablet TAKE ONE TABLET BY MOUTH AS DIRECTED 02/16/15  Yes Belva Crome, MD  gabapentin (NEURONTIN) 300 MG capsule  Take 1 capsule (300 mg total) by mouth at bedtime. 03/02/15 03/01/16  Aldine Contes, MD  HYDROcodone-acetaminophen (NORCO/VICODIN) 5-325 MG tablet Take 1 tablet by mouth every 4 (four) hours as needed. 04/24/15   Shari Upstill, PA-C   BP 154/73 mmHg  Pulse 69  Temp(Src) 97.5 F (36.4 C) (Oral)  Resp 22  Ht '5\' 6"'$  (1.676 m)  Wt 234 lb 4 oz (106.255 kg)  BMI 37.83 kg/m2  SpO2 95% Physical Exam  Constitutional: He is oriented to person, place, and time. He appears well-developed and well-nourished. No distress.  HENT:  Head: Normocephalic and atraumatic.  Eyes: Conjunctivae are normal. Pupils are  equal, round, and reactive to light.  Neck: Neck supple. No tracheal deviation present. No thyromegaly present.  Cardiovascular: Normal rate and regular rhythm.   No murmur heard. Pulmonary/Chest: Effort normal and breath sounds normal.  Abdominal: Soft. Bowel sounds are normal. He exhibits no distension. There is no tenderness.  Musculoskeletal: Normal range of motion. He exhibits no edema or tenderness.  Entire spine nontender. He has pain in lower back upon sitting up from a supine position. Right lower extremity there is a 5 x 5 cm purplish ecchymosis at the distal third of the shin with corresponding tenderness. No calf tenderness DP pulses 2+ bilaterally. Femoral pulses 2+ bilaterally all other extremities or contusion abrasion or tenderness neurovascularly intact.  Neurological: He is alert and oriented to person, place, and time. Coordination normal.  Gait normal  Skin: Skin is warm and dry. No rash noted.  Psychiatric: He has a normal mood and affect.  Nursing note and vitals reviewed.   ED Course  Procedures (including critical care time) Labs Review Labs Reviewed - No data to display  Imaging Review Dg Tibia/fibula Right  04/24/2015  CLINICAL DATA:  70 year old male with right lower extremity pain for 2 days, fell in the shower 1 week ago. Initial encounter. EXAM: RIGHT TIBIA AND FIBULA - 2 VIEW COMPARISON:  Right knee series [id number] FINDINGS: Chronic postoperative surgical clips throughout the medial visible right lower extremity. Alignment about the right knee and ankle appears grossly preserved. No acute fracture or dislocation of the right tibia or fibula identified; longitudinal tibia vascular channel noted on the frontal view. Chondrocalcinosis and other degenerative changes at the right knee. Calcified peripheral vascular disease. No subcutaneous gas. IMPRESSION: No acute fracture or dislocation identified about the right tib-fib. Electronically Signed   By: Genevie Ann M.D.   On:  04/24/2015 21:41   I have personally reviewed and evaluated these images and lab results as part of my medical decision-making.   EKG Interpretation None     11:35 AM patient sleeping, resting comfortably, easily arousable states pain is minimal after treatment with Tylenol. Right lower extremity noninvasive study showed no evidence of DVT MDM   pretest clinical suspicion for DVT low. Of note patient reports that he had rash as result of having taking oxycodone/APAP in the past. However at a lower dose he did not have a rash Final diagnoses:  None   back pain is chronic, musculoskeletal in etiology. Plan patient can take his Norco prescribed yesterday as needed. Follow-up at [facility] outpatient clinic as needed Diagnosis contusion of right leg      Orlie Dakin, MD 04/25/15 1142

## 2015-04-25 NOTE — Discharge Instructions (Signed)
Take Tylenol for mild pain or the pain medicine prescribed to you yesterday for bad pain. Don't take Tylenol together with the pain medicine prescribed yesterday, as the combination can be dangerous your liver.return if your condition worsens for any reason or contact your doctor at the [facility] outpatient clinic .

## 2015-04-26 ENCOUNTER — Inpatient Hospital Stay (HOSPITAL_COMMUNITY): Admission: RE | Admit: 2015-04-26 | Payer: Medicare HMO | Source: Ambulatory Visit

## 2015-04-27 ENCOUNTER — Ambulatory Visit (HOSPITAL_COMMUNITY)
Admission: RE | Admit: 2015-04-27 | Discharge: 2015-04-27 | Disposition: A | Discharge status: Home / Self Care | Payer: Medicare HMO | Source: Ambulatory Visit | Attending: Internal Medicine | Admitting: Internal Medicine

## 2015-04-27 ENCOUNTER — Encounter: Payer: Self-pay | Admitting: Internal Medicine

## 2015-04-27 ENCOUNTER — Ambulatory Visit (INDEPENDENT_AMBULATORY_CARE_PROVIDER_SITE_OTHER): Payer: Medicare HMO | Admitting: Internal Medicine

## 2015-04-27 VITALS — Wt 233.0 lb

## 2015-04-27 DIAGNOSIS — W19XXXD Unspecified fall, subsequent encounter: Secondary | ICD-10-CM

## 2015-04-27 DIAGNOSIS — Z951 Presence of aortocoronary bypass graft: Secondary | ICD-10-CM | POA: Insufficient documentation

## 2015-04-27 DIAGNOSIS — R1032 Left lower quadrant pain: Secondary | ICD-10-CM

## 2015-04-27 DIAGNOSIS — W19XXXA Unspecified fall, initial encounter: Secondary | ICD-10-CM | POA: Insufficient documentation

## 2015-04-27 DIAGNOSIS — M545 Low back pain: Secondary | ICD-10-CM

## 2015-04-27 DIAGNOSIS — M958 Other specified acquired deformities of musculoskeletal system: Secondary | ICD-10-CM | POA: Diagnosis not present

## 2015-04-27 DIAGNOSIS — J9811 Atelectasis: Secondary | ICD-10-CM | POA: Insufficient documentation

## 2015-04-27 DIAGNOSIS — W182XXA Fall in (into) shower or empty bathtub, initial encounter: Secondary | ICD-10-CM | POA: Insufficient documentation

## 2015-04-27 DIAGNOSIS — S2232XA Fracture of one rib, left side, initial encounter for closed fracture: Secondary | ICD-10-CM | POA: Insufficient documentation

## 2015-04-27 DIAGNOSIS — I517 Cardiomegaly: Secondary | ICD-10-CM | POA: Insufficient documentation

## 2015-04-27 DIAGNOSIS — R0781 Pleurodynia: Secondary | ICD-10-CM | POA: Diagnosis present

## 2015-04-27 MED ORDER — TRAMADOL HCL 50 MG PO TABS: 50.0000 mg | ORAL_TABLET | Freq: Four times a day (QID) | ORAL | Status: DC | PRN

## 2015-04-27 NOTE — Progress Notes (Signed)
Case discussed with Dr. Lovena Le at the time of the visit. We reviewed the resident's history and exam and pertinent patient test results. I agree with the assessment, diagnosis, and plan of care documented in the resident's note.  CXR demonstrates some plate like atelectasis at the right base and ? early left basilar atelectasis that is not appreciated on the lateral.  There is no significant effusion.  The rib films demonstrate a non-displaced fracture of the left 10th rib.  This is the likely cause of the flank pain worsened with inspiration.  Agree that treatment with a less sedating analgesic (Tramadol) is appropriate to manage his rib fracture pain and hopefully minimize splinting with possible atelectasis.

## 2015-04-27 NOTE — Assessment & Plan Note (Addendum)
Patient with pleuritic left lateral flank pain after fall.  He reports having xrays done in ED, but they are not visible in Epic.  Will procure CXR and left rib series to r/o fracture.  Patient has not tolerated Norco due to sedation, so will switch to Tramadol. - CXR - Left rib series - Tramadol 50 mg q6h PRN  Addendum 04/28/15: Rib X-ray demonstrates minimally displaced left 10th rib fracture.  Patient called and informed of results.  He was counseled to continue taking Tramadol and take normal, deep breaths.  He understands to call the clinic if he has any trouble breathing or new fever, chills, or worsening productive cough.

## 2015-04-27 NOTE — Addendum Note (Signed)
Addended by: Viviano Simas A on: 04/27/2015 11:58 AM   Modules accepted: Orders

## 2015-04-27 NOTE — Progress Notes (Signed)
Patient ID: Dylan Morgan, male   DOB: [birth year], 70 y.o.   MRN: [id number]   Subjective:   Patient ID: Dylan Morgan male   DOB: 01-10-1946 70 y.o.   MRN: [id number]  HPI: Dylan Morgan is a 70 y.o. male with PMH as below, here for evaluation of back and ankle pain after fall.  Please see Problem-Based charting for the status of the patient's chronic medical issues.  He states he fell in the shower on Friday, hitting his back on the side of the tub.  He denies hitting his head.  The next day, he also noticed bruising and pain of his right ankle.  He does not know how he injured his ankle during the fall.  He was able to ambulate on the ankle immediately after the fall, and is still able to ambulate. Tib/fib xrays were negative for fracture.  LE [place] in the ED was negative for DVT. The patient has been therapeutically anticoagulated for his afib.  Patient continues to have left flank pain, described as "throbbing."  It does not radiate.  It is worsened with a deep breath and causes him to stop inhalation.  He states that he was Xrayed from head to abdomen, but the only films available in Epic are of his Tib/fib.  He denies CP, SOB, lightheadedness, dizziness, or calf swelling.    Past Medical History  Diagnosis Date  . CAD (coronary artery disease) CABG in 2004    a. 2004 s/p CABG  . GERD (gastroesophageal reflux disease)   . Hypertension   . Hyperlipidemia   . Renal insufficiency   . Tobacco abuse   . Seborrheic dermatitis of scalp   . Bradycardia   . AAA (abdominal aortic aneurysm) ([place])     a. 2010 s/p stenting  . Paroxysmal atrial fibrillation ([place]) 02/22/2009    a. on Coumadin   . Carotid artery disease ([place])     a. 2012 dopplers with old LICA occlusion , RICA no significant abnormality  . Arthritis   . Prediabetes   . CVA (cerebral infarction)     a. 2013 R Carona radiata stroke    Current Outpatient Prescriptions  Medication Sig Dispense Refill  . aspirin EC 81 MG  tablet Take 81 mg by mouth every other day. After supper    . gabapentin (NEURONTIN) 300 MG capsule Take 1 capsule (300 mg total) by mouth at bedtime. 30 capsule 2  . HYDROcodone-acetaminophen (NORCO/VICODIN) 5-325 MG tablet Take 1 tablet by mouth every 4 (four) hours as needed. 6 tablet 0  . lisinopril (PRINIVIL,ZESTRIL) 20 MG tablet Take 1 tablet (20 mg total) by mouth daily. 30 tablet 3  . simvastatin (ZOCOR) 40 MG tablet Take 1 tablet (40 mg total) by mouth daily after supper. 90 tablet 3  . traMADol (ULTRAM) 50 MG tablet Take 1 tablet (50 mg total) by mouth every 6 (six) hours as needed. 30 tablet 0  . warfarin (COUMADIN) 5 MG tablet TAKE ONE TABLET BY MOUTH AS DIRECTED 35 tablet 1   No current facility-administered medications for this visit.   Family History  Problem Relation Age of Onset  . Diabetes Mother   . Stroke Father   . Thyroid cancer Sister   . Heart disease Brother     Coronary artery disease   Social History   Social History  . Marital Status: Single    Spouse Name: N/A  . Number of Children: N/A  . Years of Education: N/A  Occupational History  . disabled    Social History Main Topics  . Smoking status: Former Smoker -- 0.00 packs/day for 35 years    Types: Cigarettes    Quit date: 02/03/2010  . Smokeless tobacco: Never Used  . Alcohol Use: No  . Drug Use: No  . Sexual Activity: Not Asked   Other Topics Concern  . None   Social History Narrative   Lives with son and his significant other, denies being married. Has 3 daughters & a son.  Several grandchildren.    Divorced.   Regular exercise.   Prior to retirement, hung dry wall for a living.    Review of Systems: Pertinent items are noted in HPI. Objective:  Physical Exam: Filed Vitals:   04/27/15 0943  Weight: 233 lb (105.688 kg)   Physical Exam  Constitutional: He is oriented to person, place, and time.  Elderly male, sitting in chair, uncomfortable, but NAD.  HENT:  Head: Normocephalic  and atraumatic.  Eyes: EOM are normal. No scleral icterus.  Neck: No tracheal deviation present.  Cardiovascular: Normal rate, regular rhythm and normal heart sounds.   Right PT/DP pulses 1+.  Pulmonary/Chest: Breath sounds normal. No stridor. No respiratory distress. He has no wheezes.  Effort limited 2/2 pain, but no crackles or wheezes appreciated.  Symmetric breath sounds with good air movement.  Musculoskeletal:  Left lateral flank without overlying ecchymosis.  Point tenderness to palpation ~T4/T5.  Trace RLE edema to midshin.  Point tenderless over right lateral malleolus.  Ecchymosis inferior to medial malleolus without tenderness.  Gait slow and limited by pain.  Patient able to bear weight.  Neurological: He is alert and oriented to person, place, and time.  Skin: Skin is warm and dry. No rash noted.     Assessment & Plan:   Patient and case were discussed with Dr. Eppie Gibson.  Please refer to Problem Based charting for further documentation.

## 2015-04-27 NOTE — Patient Instructions (Signed)
1. Take Tramadol 1 tab every 6 hours as needed for pain. 2. We will get some X-rays.  Tramadol extended release tablets or capsules What is this medicine? TRAMADOL (TRA ma dole) is a pain reliever. It is used to treat moderate to severe pain in adults. This medicine may be used for other purposes; ask your health care provider or pharmacist if you have questions. What should I tell my health care provider before I take this medicine? They need to know if you have any of these conditions: -brain tumor -drink more than 3 alcohol-containing drinks per day -drug abuse or addiction -head injury -kidney disease or problems going to the bathroom -liver disease -lung disease, asthma, or breathing problems -seizures or epilepsy -an unusual or allergic reaction to tramadol, codeine, other medicines, foods, dyes, or preservatives -pregnant or trying to get pregnant -breast-feeding How should I use this medicine? Take this medicine by mouth with a glass of water. Follow the directions on the prescription label. Do not cut, crush or chew this medicine. Crushing this medicine may cause overdose and death. This risk is increased in patients who abuse alcohol or other substances. Take this medicine the same way each day, either with food or not. If the medicine upsets your stomach, take it with food or milk. Take your medicine at regular intervals. Do not take it more often than directed. Talk to your pediatrician regarding the use of this medicine in children. This medicine is not approved for use in children. Overdosage: If you think you have taken too much of this medicine contact a poison control center or emergency room at once. NOTE: This medicine is only for you. Do not share this medicine with others. What if I miss a dose? If you miss a dose, take it as soon as you can. If it is almost time for your next dose, take only that dose. Do not take double or extra doses. What may interact with this  medicine? Do not take this medicine with any of the following medications: -MAOIs like Carbex, Eldepryl, Marplan, Nardil, and Parnate This medicine may also interact with the following medications: -alcohol or medicines that contain alcohol -antihistamines -benzodiazepines -bupropion -carbamazepine or oxcarbazepine -clozapine -cyclobenzaprine -digoxin -furazolidone -linezolid -medicines for depression, anxiety, or psychotic disturbances -medicines for migraine headache like almotriptan, eletriptan, frovatriptan, naratriptan, rizatriptan, sumatriptan, zolmitriptan -medicines for pain like pentazocine, buprenorphine, butorphanol, meperidine, nalbuphine, and propoxyphene -medicines for sleep -muscle relaxants -naltrexone -phenobarbital -phenothiazines like perphenazine, thioridazine, chlorpromazine, mesoridazine, fluphenazine, prochlorperazine, promazine, and trifluoperazine -procarbazine -warfarin This list may not describe all possible interactions. Give your health care provider a list of all the medicines, herbs, non-prescription drugs, or dietary supplements you use. Also tell them if you smoke, drink alcohol, or use illegal drugs. Some items may interact with your medicine. What should I watch for while using this medicine? Tell your doctor or health care professional if your pain does not go away, if it gets worse, or if you have new or a different type of pain. You may develop tolerance to the medicine. Tolerance means that you will need a higher dose of the medicine for pain relief. Tolerance is normal and is expected if you take this medicine for a long time. Do not suddenly stop taking your medicine because you may develop a severe reaction. Your body becomes used to the medicine. This does NOT mean you are addicted. Addiction is a behavior related to getting and using a drug for a non-medical reason. If you  have pain, you have a medical reason to take pain medicine. Your doctor  will tell you how much medicine to take. If your doctor wants you to stop the medicine, the dose will be slowly lowered over time to avoid any side effects. You may get drowsy or dizzy. Do not drive, use machinery, or do anything that needs mental alertness until you know how this medicine affects you. Do not stand or sit up quickly, especially if you are an older patient. This reduces the risk of dizzy or fainting spells. Alcohol can increase or decrease the effects of this medicine. Avoid alcoholic drinks. You may have constipation. Try to have a bowel movement at least every 2 to 3 days. If you do not have a bowel movement for 3 days, call your doctor or health care professional. Your mouth may get dry. Chewing sugarless gum or sucking hard candy, and drinking plenty of water may help. Contact your doctor if the problem does not go away or is severe. What side effects may I notice from receiving this medicine? Side effects that you should report to your doctor or health care professional as soon as possible: -allergic reactions like skin rash, itching or hives, swelling of the face, lips, or tongue -breathing problems -confusion -feeling faint or lightheaded, falls -itching -redness, blistering, peeling or loosening of the skin, including inside the mouth -seizures Side effects that usually do not require medical attention (report to your doctor or health care professional if they continue or are bothersome): -constipation -dizziness -drowsiness -headache -nausea, vomiting This list may not describe all possible side effects. Call your doctor for medical advice about side effects. You may report side effects to FDA at 1-800-FDA-1088. Where should I keep my medicine? Keep out of the reach of children. This medicine may cause accidental overdose and death if it taken by other adults, children, or pets. Mix any unused medicine with a substance like cat litter or coffee grounds. Then throw the  medicine away in a sealed container like a sealed bag or a coffee can with a lid. Do not use the medicine after the expiration date. Store at room temperature between 15 and 30 degrees C (59 and 86 degrees F). Keep container tightly closed. NOTE: This sheet is a summary. It may not cover all possible information. If you have questions about this medicine, talk to your doctor, pharmacist, or health care provider.    2016, Elsevier/Gold Standard. (2013-05-16 15:41:03)

## 2015-05-03 ENCOUNTER — Other Ambulatory Visit: Payer: Self-pay | Admitting: Interventional Cardiology

## 2015-05-07 ENCOUNTER — Ambulatory Visit (INDEPENDENT_AMBULATORY_CARE_PROVIDER_SITE_OTHER): Payer: Medicare HMO | Admitting: *Deleted

## 2015-05-07 DIAGNOSIS — I639 Cerebral infarction, unspecified: Secondary | ICD-10-CM

## 2015-05-07 DIAGNOSIS — Z5181 Encounter for therapeutic drug level monitoring: Secondary | ICD-10-CM | POA: Diagnosis not present

## 2015-05-07 DIAGNOSIS — Z7901 Long term (current) use of anticoagulants: Secondary | ICD-10-CM

## 2015-05-07 DIAGNOSIS — I6310 Cerebral infarction due to embolism of unspecified precerebral artery: Secondary | ICD-10-CM

## 2015-05-07 DIAGNOSIS — I4891 Unspecified atrial fibrillation: Secondary | ICD-10-CM

## 2015-05-07 DIAGNOSIS — I480 Paroxysmal atrial fibrillation: Secondary | ICD-10-CM

## 2015-05-07 LAB — POCT INR: INR: 3.7

## 2015-05-14 ENCOUNTER — Ambulatory Visit: Payer: Medicare HMO | Attending: Internal Medicine | Admitting: Rehabilitation

## 2015-05-14 ENCOUNTER — Encounter: Payer: Self-pay | Admitting: Rehabilitation

## 2015-05-14 DIAGNOSIS — R6889 Other general symptoms and signs: Secondary | ICD-10-CM

## 2015-05-14 DIAGNOSIS — R2681 Unsteadiness on feet: Secondary | ICD-10-CM | POA: Diagnosis present

## 2015-05-14 DIAGNOSIS — R531 Weakness: Secondary | ICD-10-CM | POA: Insufficient documentation

## 2015-05-14 DIAGNOSIS — Z7409 Other reduced mobility: Secondary | ICD-10-CM | POA: Insufficient documentation

## 2015-05-14 NOTE — Therapy (Signed)
[name], [name], [address] Phone: [phone]   Fax:  [phone]  Physical Therapy Evaluation  Patient Details  Name: Dylan Morgan MRN: [id number] Date of Birth: [birth year] Referring Provider: Larrie Kass, MD  Encounter Date: 05/14/2015      PT End of Session - 05/14/15 0853    Visit Number 1  eval only   Number of Visits 1   Authorization Type [employer] MCR-G code required   PT Start Time 0801   PT Stop Time 0848   PT Time Calculation (min) 47 min   Activity Tolerance Patient tolerated treatment well   Behavior During Therapy [facility] for tasks assessed/performed      Past Medical History  Diagnosis Date  . CAD (coronary artery disease) CABG in 2004    a. 2004 s/p CABG  . GERD (gastroesophageal reflux disease)   . Hypertension   . Hyperlipidemia   . Renal insufficiency   . Tobacco abuse   . Seborrheic dermatitis of scalp   . Bradycardia   . AAA (abdominal aortic aneurysm) ([place])     a. 2010 s/p stenting  . Paroxysmal atrial fibrillation ([place]) 02/22/2009    a. on Coumadin   . Carotid artery disease ([place])     a. 2012 dopplers with old LICA occlusion , RICA no significant abnormality  . Arthritis   . Prediabetes   . CVA (cerebral infarction)     a. 2013 R Carona radiata stroke     Past Surgical History  Procedure Laterality Date  . Coronary artery bypass graft  2004  . Rotator cuff repair    . Abdominal aortic aneurysm repair  2010    Aortic stent repair    There were no vitals filed for this visit.  Visit Diagnosis:  Generalized weakness - Plan: PT plan of care cert/re-cert  Decreased strength, endurance, and mobility - Plan: PT plan of care cert/re-cert  Unsteadiness - Plan: PT plan of care cert/re-cert      Subjective Assessment - 05/14/15 0807    Subjective "They called and asked me to come in to evaluate me."     Patient Stated Goals Pt states that he has no  deficits, no difficulties with anything at home.    Currently in Pain? No/denies            [facility] PT Assessment - 05/14/15 0001    Assessment   Medical Diagnosis Falls   Referring Provider Dylan Kass, MD   Onset Date/Surgical Date 04/05/15   Precautions   Precautions Fall   Restrictions   Weight Bearing Restrictions No   Balance Screen   Has the patient fallen in the past 6 months Yes   How many times? 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   Leisure Likes to go out and pick up "junk"    Cognition   Overall Cognitive Status Within Functional Limits for tasks assessed   Sensation   Light Touch Appears Intact   Hot/Cold Appears Intact   Proprioception Appears Intact   Coordination   Gross Motor Movements are Fluid and Coordinated Yes   Fine Motor Movements are Fluid and Coordinated Yes   ROM / Strength   AROM / PROM / Strength Strength   Strength   Overall Strength Within functional limits for tasks performed   Transfers   Transfers Sit to Stand;Stand to Sit   Sit to Stand 7: Independent   Stand to Sit 7: Independent   Ambulation/Gait   Ambulation/Gait Yes   Ambulation/Gait Assistance 6: Modified independent (Device/Increase time)   Ambulation Distance (Feet) 400 Feet   Assistive device None   Gait Pattern Decreased  stride length;Step-through pattern;Trunk flexed   Ambulation Surface Level;Indoor;Unlevel;Outdoor;Paved;Gravel;Grass   Gait velocity 2.78 ft/sec   Stairs Yes   Stairs Assistance 6: Modified independent (Device/Increase time)   Stair Management Technique One rail Right;Alternating pattern;Step to pattern;Forwards   Number of Stairs 4   Height of Stairs 6   Gait Comments Pt able to ambulate on varying surfaces outdoors at mod I level with no overt LOB.  Had him perform head turns while on grass (up/down and side/side) with mild staggering noted, but pt able to self correct without assist.     Functional Gait  Assessment   Gait assessed  Yes   Gait Level Surface Walks 20 ft in less than 7 sec but greater than 5.5 sec, uses assistive device, slower speed, mild gait deviations, or deviates 6-10 in outside of the 12 in walkway width.   Change in Gait Speed Able to change speed, demonstrates mild gait deviations, deviates 6-10 in outside of the 12 in walkway width, or no gait deviations, unable to achieve a major change in velocity, or uses a change in velocity, or uses an assistive device.   Gait with Horizontal Head Turns Performs head turns smoothly with no change in gait. Deviates no more than 6 in outside 12 in walkway width   Gait with Vertical Head Turns Performs task with slight change in gait velocity (eg, minor disruption to smooth gait path), deviates 6 - 10 in outside 12 in walkway width or uses assistive device   Gait and Pivot Turn Pivot turns safely within 3 sec and stops quickly with no loss of balance.   Step Over Obstacle Is able to step over one shoe box (4.5 in total height) without changing gait speed. No evidence of imbalance.   Gait with Narrow Base of Support Ambulates less than 4 steps heel to toe or cannot perform without assistance.   Gait with Eyes Closed Walks 20 ft, uses assistive device, slower speed, mild gait deviations, deviates 6-10 in outside 12 in walkway width.  Ambulates 20 ft in less than 9 sec but greater than 7 sec.   Ambulating Backwards Walks 20 ft, uses assistive device, slower speed, mild gait deviations, deviates 6-10 in outside 12 in walkway width.   Steps Two feet to a stair, must use rail.   Total Score 19   FGA comment: 19-24 = medium risk fall                           PT Education - 05/14/15 6295    Education provided Yes  Education Details OTAGO HEP, evaluation findings, benefits of therapy   Person(s) Educated Patient   Methods Explanation;Handout;Verbal cues   Comprehension Verbalized understanding                    Plan - 05-23-15 0854    Clinical Impression Statement Pt presents s/p fall in shower in early January and sustained L 10th rib fracture and possible mild injury to R ankle.  He demonstrates generalized weakness, decreased balance and poor endurance during PT evaluation.  FGA with score of 19/30, indicative of medium fall risk, gait speed at 2.78 ft/sec (less than normal walking speed) and increaesd dyspnea on exertion.  Went over results with pt and benefits of therapy, however he did not feel the need to participate in therapy at this time and was worried about high co-pay.  Provided pt with OTAGO HEP for generalized strengthening and balance, pt verbalized understanding of exercises and how to perform 3-5 daily.  Feel that pt could benefit from short bout of PT to improve deficits listed above, however pt declined.  PT to sign off at this time.  Thank you for referral.     Pt will benefit from skilled therapeutic intervention in order to improve on the following deficits Decreased balance;Decreased activity tolerance;Decreased endurance;Obesity   Rehab Potential Good   PT Frequency 1x / week  recommendations made for 1x/wk for 4 weeks, pt declined   PT Duration 4 weeks   PT Next Visit Plan n/a   Consulted and Agree with Plan of Care Patient          G-Codes - 05/23/2015 0901     Functional Assessment Tool Used FGA: 19/30   Functional Limitation Mobility: Walking and moving around   Mobility: Walking and Moving Around Current Status [phone]) At least 20 percent but less than 40 percent impaired, limited or restricted   Mobility: Walking and Moving Around Goal Status [phone]) At least 20 percent but less than 40 percent impaired, limited or restricted   Mobility: Walking and Moving Around Discharge Status [phone]) At least 20 percent but less than 40 percent impaired, limited or restricted       Problem List Patient Active Problem List   Diagnosis Date Noted  . Fall 04/27/2015  . Left foot pain 03/02/2015  . S/P AAA (abdominal aortic aneurysm) repair 06/21/2014  . Acute upper respiratory infection 06/19/2014  . Ingrown right big toenail 02/13/2014  . Peripheral neuropathy ([place]) 01/16/2014  . Chest pain 08/26/2013  . Shortness of breath 07/28/2013  . Acute upper respiratory infections of unspecified site 07/11/2013  . Encounter for therapeutic drug monitoring 05/12/2013  . Nasal congestion 04/14/2013  . Onychomycosis 01/10/2013  . Rash and nonspecific skin eruption 10/09/2012  . Allergic urticaria 08/09/2012  . Chronic anticoagulation 05/01/2012  . Neck pain, chronic 03/13/2012  . Lower extremity pain, bilateral 03/09/2012  . H/O TIA (transient ischemic attack) and stroke 01/02/2012  . Abdominal aneurysm without mention of rupture 01/02/2012  . Occlusion and stenosis of carotid artery without mention of cerebral infarction 12/19/2011  . TIA (transient ischemic attack) 11/23/2011  . Preventative health care 09/20/2011  . CVA (cerebral infarction)   . Prediabetes   . Long term (current) use of anticoagulants 08/04/2011  . Carotid artery disease ([place])   . CAD (coronary artery disease)   . Hypertension   . Hyperlipidemia   . Bradycardia   . Ejection fraction   . Hx of CABG   . AAA (abdominal aortic  aneurysm) ([place])   . Leg cramps, sleep related 04/07/2011   . Erectile dysfunction 02/06/2011  . Paroxysmal atrial fibrillation ([place]) 02/22/2009  . DERMATITIS, SEBORRHEIC 07/10/2008  . DEPRESSIVE DISORDER 02/04/2007  . SLEEP APNEA 02/04/2007  . GERD 02/20/2006  . CKD (chronic kidney disease), stage III 02/20/2006  . BACK PAIN, CHRONIC 02/20/2006    Cameron Sprang, PT, MPT [facility] [address] [place], [name], [address] Phone: [phone]   Fax:  [phone] 05/14/2015, 9:04 AM  Name: Dylan Morgan MRN: [id number] Date of Birth: [birth year]

## 2015-05-21 ENCOUNTER — Ambulatory Visit (INDEPENDENT_AMBULATORY_CARE_PROVIDER_SITE_OTHER): Payer: Medicare HMO | Admitting: *Deleted

## 2015-05-21 DIAGNOSIS — I639 Cerebral infarction, unspecified: Secondary | ICD-10-CM

## 2015-05-21 DIAGNOSIS — I480 Paroxysmal atrial fibrillation: Secondary | ICD-10-CM | POA: Diagnosis not present

## 2015-05-21 DIAGNOSIS — I4891 Unspecified atrial fibrillation: Secondary | ICD-10-CM

## 2015-05-21 DIAGNOSIS — Z5181 Encounter for therapeutic drug level monitoring: Secondary | ICD-10-CM | POA: Diagnosis not present

## 2015-05-21 DIAGNOSIS — Z7901 Long term (current) use of anticoagulants: Secondary | ICD-10-CM

## 2015-05-21 DIAGNOSIS — I6310 Cerebral infarction due to embolism of unspecified precerebral artery: Secondary | ICD-10-CM

## 2015-05-21 LAB — POCT INR: INR: 2.4

## 2015-06-01 ENCOUNTER — Encounter: Payer: Self-pay | Admitting: Internal Medicine

## 2015-06-01 ENCOUNTER — Ambulatory Visit (INDEPENDENT_AMBULATORY_CARE_PROVIDER_SITE_OTHER): Payer: Medicare HMO | Admitting: Internal Medicine

## 2015-06-01 VITALS — BP 174/93 | HR 52 | Temp 97.6°F | Ht 67.0 in | Wt 237.6 lb

## 2015-06-01 DIAGNOSIS — I110 Hypertensive heart disease with heart failure: Secondary | ICD-10-CM

## 2015-06-01 DIAGNOSIS — I714 Abdominal aortic aneurysm, without rupture: Secondary | ICD-10-CM

## 2015-06-01 DIAGNOSIS — N183 Chronic kidney disease, stage 3 unspecified: Secondary | ICD-10-CM

## 2015-06-01 DIAGNOSIS — R053 Chronic cough: Secondary | ICD-10-CM | POA: Insufficient documentation

## 2015-06-01 DIAGNOSIS — I10 Essential (primary) hypertension: Secondary | ICD-10-CM

## 2015-06-01 DIAGNOSIS — I480 Paroxysmal atrial fibrillation: Secondary | ICD-10-CM

## 2015-06-01 DIAGNOSIS — R059 Cough, unspecified: Secondary | ICD-10-CM

## 2015-06-01 DIAGNOSIS — R05 Cough: Secondary | ICD-10-CM

## 2015-06-01 DIAGNOSIS — Z0000 Encounter for general adult medical examination without abnormal findings: Secondary | ICD-10-CM

## 2015-06-01 MED ORDER — LISINOPRIL 40 MG PO TABS: 40.0000 mg | ORAL_TABLET | Freq: Every day | ORAL | Status: DC

## 2015-06-01 MED ORDER — GABAPENTIN 300 MG PO CAPS: 300.0000 mg | ORAL_CAPSULE | Freq: Every day | ORAL | Status: DC

## 2015-06-01 NOTE — Assessment & Plan Note (Signed)
-   likely secondary to viral URI - It is resolving - Exam with no pharyngeal exudates/erythema/cervical LAN - Will continue with conservative measures and monitor

## 2015-06-01 NOTE — Patient Instructions (Addendum)
- It was a pleasure seeing you today - We will check your urine today - We will also check your blood work including kidney function and cholesterol - continue with over the counter cough medication for your cough. If it worsens or you develop fevers please come back to the clinic - Your BP is very high. I will increase your lisinopril to 40 mg - We need to schedule you for a colonoscopy  Upper Respiratory Infection, Adult Most upper respiratory infections (URIs) are a viral infection of the air passages leading to the lungs. A URI affects the nose, throat, and upper air passages. The most common type of URI is nasopharyngitis and is typically referred to as "the common cold." URIs run their course and usually go away on their own. Most of the time, a URI does not require medical attention, but sometimes a bacterial infection in the upper airways can follow a viral infection. This is called a secondary infection. Sinus and middle ear infections are common types of secondary upper respiratory infections. Bacterial pneumonia can also complicate a URI. A URI can worsen asthma and chronic obstructive pulmonary disease (COPD). Sometimes, these complications can require emergency medical care and may be life threatening.  CAUSES Almost all URIs are caused by viruses. A virus is a type of germ and can spread from one person to another.  RISKS FACTORS You may be at risk for a URI if:   You smoke.   You have chronic heart or lung disease.  You have a weakened defense (immune) system.   You are very young or very old.   You have nasal allergies or asthma.  You work in crowded or poorly ventilated areas.  You work in health care facilities or schools. SIGNS AND SYMPTOMS  Symptoms typically develop 2-3 days after you come in contact with a cold virus. Most viral URIs last 7-10 days. However, viral URIs from the influenza virus (flu virus) can last 14-18 days and are typically more severe.  Symptoms may include:   Runny or stuffy (congested) nose.   Sneezing.   Cough.   Sore throat.   Headache.   Fatigue.   Fever.   Loss of appetite.   Pain in your forehead, behind your eyes, and over your cheekbones (sinus pain).  Muscle aches.  DIAGNOSIS  Your health care provider may diagnose a URI by:  Physical exam.  Tests to check that your symptoms are not due to another condition such as:  Strep throat.  Sinusitis.  Pneumonia.  Asthma. TREATMENT  A URI goes away on its own with time. It cannot be cured with medicines, but medicines may be prescribed or recommended to relieve symptoms. Medicines may help:  Reduce your fever.  Reduce your cough.  Relieve nasal congestion. HOME CARE INSTRUCTIONS   Take medicines only as directed by your health care provider.   Gargle warm saltwater or take cough drops to comfort your throat as directed by your health care provider.  Use a warm mist humidifier or inhale steam from a shower to increase air moisture. This may make it easier to breathe.  Drink enough fluid to keep your urine clear or pale yellow.   Eat soups and other clear broths and maintain good nutrition.   Rest as needed.   Return to work when your temperature has returned to normal or as your health care provider advises. You may need to stay home longer to avoid infecting others. You can also use a  face mask and careful hand washing to prevent spread of the virus.  Increase the usage of your inhaler if you have asthma.   Do not use any tobacco products, including cigarettes, chewing tobacco, or electronic cigarettes. If you need help quitting, ask your health care provider. PREVENTION  The best way to protect yourself from getting a cold is to practice good hygiene.   Avoid oral or hand contact with people with cold symptoms.   Wash your hands often if contact occurs.  There is no clear evidence that vitamin C, vitamin E,  echinacea, or exercise reduces the chance of developing a cold. However, it is always recommended to get plenty of rest, exercise, and practice good nutrition.  SEEK MEDICAL CARE IF:   You are getting worse rather than better.   Your symptoms are not controlled by medicine.   You have chills.  You have worsening shortness of breath.  You have [place] or red mucus.  You have yellow or [place] nasal discharge.  You have pain in your face, especially when you bend forward.  You have a fever.  You have swollen neck glands.  You have pain while swallowing.  You have white areas in the back of your throat. SEEK IMMEDIATE MEDICAL CARE IF:   You have severe or persistent:  Headache.  Ear pain.  Sinus pain.  Chest pain.  You have chronic lung disease and any of the following:  Wheezing.  Prolonged cough.  Coughing up blood.  A change in your usual mucus.  You have a stiff neck.  You have changes in your:  Vision.  Hearing.  Thinking.  Mood. MAKE SURE YOU:   Understand these instructions.  Will watch your condition.  Will get help right away if you are not doing well or get worse.   This information is not intended to replace advice given to you by your health care provider. Make sure you discuss any questions you have with your health care provider.   Document Released: 09/13/2000 Document Revised: 08/04/2014 Document Reviewed: 06/25/2013 Elsevier Interactive Patient Education [employer].

## 2015-06-01 NOTE — Progress Notes (Signed)
   Subjective:    Patient ID: Dylan Morgan, male    DOB: [birth year], 70 y.o.   MRN: [id number]  HPI Patient seen and examined. He is here for routine follow up of his HTN. He is compliant with his meds. Of note, he does complain of a dry cough. States it started 7-10 days ago and was initially productive. Had a fever on the 1st day of symptoms which resolved after that. He states the cough has been getting better. No sore throat, no wheezing, no SOB, no CP. He also missed his scheduled colonoscopy in January. Wants to have it rescheduled.    Review of Systems  Constitutional: Negative.   HENT: Negative.   Eyes: Negative.   Respiratory: Positive for cough. Negative for chest tightness, shortness of breath and wheezing.   Cardiovascular: Negative.   Gastrointestinal: Negative.   Musculoskeletal: Negative.   Skin: Negative.   Neurological: Negative.   Psychiatric/Behavioral: Negative.        Objective:   Physical Exam  Constitutional: He is oriented to person, place, and time. He appears well-developed and well-nourished.  HENT:  Head: Normocephalic and atraumatic.  Neck:  Normal oropharynx, no erythema, no exudate  Cardiovascular: Normal rate, regular rhythm and normal heart sounds.   Pulmonary/Chest: Effort normal and breath sounds normal. No respiratory distress.  Mild scattered end expiratory wheezes  Abdominal: Soft. Bowel sounds are normal. He exhibits no distension. There is no tenderness.  Musculoskeletal: Normal range of motion.  Trace b/l LE edema  Lymphadenopathy:    He has no cervical adenopathy.  Neurological: He is alert and oriented to person, place, and time.  Skin: Skin is warm. No rash noted. No erythema.  Psychiatric: He has a normal mood and affect. His behavior is normal.          Assessment & Plan:  Please see problem based charting for assessment and plan:

## 2015-06-01 NOTE — Assessment & Plan Note (Signed)
-   Will check BMP, u/a today - Patient states he has no issues with urinating, no hematuria - If worsening GFR will consider referring to nephrology for f/u - Will need better BP control. Lisinopril increased to 40 mg

## 2015-06-01 NOTE — Assessment & Plan Note (Signed)
-   Patient missed colonoscopy in January - Will reschedule and follow up results

## 2015-06-01 NOTE — Assessment & Plan Note (Signed)
-   s/p stenting - f/u with Dr. Kellie Simmering - no abd pain, no pulsatile mass noted on exam

## 2015-06-01 NOTE — Assessment & Plan Note (Addendum)
-   Patient with no recent episodes of palpitations - INR is therapeutic -  Patient follows up with Dr. Ron Parker

## 2015-06-01 NOTE — Assessment & Plan Note (Signed)
BP Readings from Last 3 Encounters:  06/01/15 174/93  04/25/15 154/73  04/24/15 152/75    Lab Results  Component Value Date   NA 141 12/14/2014   K 4.9 12/14/2014   CREATININE 1.34* 12/14/2014    Assessment: Blood pressure control:  poor Progress toward BP goal:   deteriorated Comments: Patient states he is taking his meds  Plan: Medications:  will increase lisinopril to 40 mg Educational resources provided: brochure (denied ) Self management tools provided:   Other plans: Can consider adding HCTZ if BP remains elevated

## 2015-06-02 LAB — URINALYSIS, COMPLETE
Bilirubin, UA: NEGATIVE
Glucose, UA: NEGATIVE
Ketones, UA: NEGATIVE
Leukocytes, UA: NEGATIVE
NITRITE UA: NEGATIVE
PH UA: 5.5 (ref 5.0–7.5)
Protein, UA: NEGATIVE
RBC, UA: NEGATIVE
Specific Gravity, UA: 1.016 (ref 1.005–1.030)
Urobilinogen, Ur: 0.2 mg/dL (ref 0.2–1.0)

## 2015-06-02 LAB — BMP8+ANION GAP
Anion Gap: 17 mmol/L (ref 10.0–18.0)
BUN/Creatinine Ratio: 24 — ABNORMAL HIGH (ref 10–22)
BUN: 29 mg/dL — AB (ref 8–27)
CALCIUM: 9.4 mg/dL (ref 8.6–10.2)
CHLORIDE: 101 mmol/L (ref 96–106)
CO2: 22 mmol/L (ref 18–29)
Creatinine, Ser: 1.23 mg/dL (ref 0.76–1.27)
GFR calc Af Amer: 68 mL/min/{1.73_m2} (ref >59)
GFR calc non Af Amer: 59 mL/min/{1.73_m2} — ABNORMAL LOW (ref >59)
GLUCOSE: 87 mg/dL (ref 65–99)
Potassium: 4.9 mmol/L (ref 3.5–5.2)
Sodium: 140 mmol/L (ref 134–144)

## 2015-06-02 LAB — MICROSCOPIC EXAMINATION
BACTERIA UA: NONE SEEN
CASTS: NONE SEEN /LPF
EPITHELIAL CELLS (NON RENAL): NONE SEEN /HPF (ref 0–10)

## 2015-06-02 LAB — LIPID PANEL
CHOLESTEROL TOTAL: 139 mg/dL (ref 100–199)
Chol/HDL Ratio: 3.6 ratio units (ref 0.0–5.0)
HDL: 39 mg/dL — ABNORMAL LOW (ref >39)
LDL CALC: 27 mg/dL (ref 0–99)
TRIGLYCERIDES: 366 mg/dL — AB (ref 0–149)
VLDL Cholesterol Cal: 73 mg/dL — ABNORMAL HIGH (ref 5–40)

## 2015-06-02 LAB — PROTEIN,TOTAL,URINE: PROTEIN UR: 5.3 mg/dL

## 2015-06-03 ENCOUNTER — Telehealth: Payer: Self-pay | Admitting: Internal Medicine

## 2015-06-03 MED ORDER — OSELTAMIVIR PHOSPHATE 30 MG PO CAPS: 30.0000 mg | ORAL_CAPSULE | Freq: Two times a day (BID) | ORAL | Status: DC

## 2015-06-03 NOTE — Telephone Encounter (Signed)
Called pt, gave him appt time and told him about tamiflu, he was agreeable

## 2015-06-03 NOTE — Telephone Encounter (Signed)
Called back got vmail, lm for rtc

## 2015-06-03 NOTE — Telephone Encounter (Signed)
Pt calls and states you told him at his appt that you would call him and abx in if he didn't feel better, Has no fever Able to eat and drink +achiness "all over" "a little sore throat" Cough No congestion noted Denies chest pain, shortness of breath, h/a Please advise

## 2015-06-03 NOTE — Telephone Encounter (Signed)
Called patient. He now complains of chills and myalgias beginning yesterday with a sore throat. Sounds suspicious for the flu. Attempted to get patient an appointment for tomorrow but there is no availability till Monday. Will start him on tamiflu given likely flu and have him follow up on Monday

## 2015-06-03 NOTE — Telephone Encounter (Signed)
Pt states he is sick, running nose, sore throat and aching all over the body. Requesting antibiotic to be called to the pharmacy. Please call pt back.

## 2015-06-04 ENCOUNTER — Telehealth: Payer: Self-pay | Admitting: Internal Medicine

## 2015-06-04 ENCOUNTER — Ambulatory Visit (INDEPENDENT_AMBULATORY_CARE_PROVIDER_SITE_OTHER): Payer: Medicare HMO | Admitting: *Deleted

## 2015-06-04 DIAGNOSIS — I480 Paroxysmal atrial fibrillation: Secondary | ICD-10-CM | POA: Diagnosis not present

## 2015-06-04 DIAGNOSIS — Z7901 Long term (current) use of anticoagulants: Secondary | ICD-10-CM | POA: Diagnosis not present

## 2015-06-04 DIAGNOSIS — I4891 Unspecified atrial fibrillation: Secondary | ICD-10-CM | POA: Diagnosis not present

## 2015-06-04 DIAGNOSIS — I639 Cerebral infarction, unspecified: Secondary | ICD-10-CM | POA: Diagnosis not present

## 2015-06-04 LAB — POCT INR: INR: 2

## 2015-06-04 NOTE — Telephone Encounter (Signed)
Pt states antibiotic is $83.00, unable to afford. Requesting something cheaper. Please call pt back.

## 2015-06-05 NOTE — Telephone Encounter (Signed)
With his renal function he can't take the 75 mg. I would hold off on meds and see him on monday

## 2015-06-07 ENCOUNTER — Encounter: Payer: Medicare HMO | Admitting: Internal Medicine

## 2015-06-08 ENCOUNTER — Telehealth: Payer: Self-pay | Admitting: Family

## 2015-06-08 NOTE — Telephone Encounter (Signed)
-----   Message from Orlene Plum sent at 06/08/2015 10:12 AM EST ----- Regarding: FW: CTA 06/22/15 Well, miracles do happen.  I received the authorization for the CTA.  Leave the appointment as planned.  Thanks, Debbie   ----- Message -----    From: Orlene Plum    Sent: 06/07/2015  11:13 AM      To: Estil Daft Subject: CTA 06/22/15                                   [employer] HAS DENIED THE CTA.  THE PATIENT MUST BE SEEN WITHIN 60 DAYS PRIOR TO CTA.  PLEASE RESCHEDULE THESE APPOINTMENTS.    THANKS, DEBBIE

## 2015-06-14 ENCOUNTER — Emergency Department (HOSPITAL_COMMUNITY): Payer: Medicare HMO

## 2015-06-14 ENCOUNTER — Emergency Department (HOSPITAL_COMMUNITY)
Admission: EM | Admit: 2015-06-14 | Discharge: 2015-06-14 | Disposition: A | Discharge status: Home / Self Care | Payer: Medicare HMO | Attending: Emergency Medicine | Admitting: Emergency Medicine

## 2015-06-14 ENCOUNTER — Encounter (HOSPITAL_COMMUNITY): Payer: Self-pay | Admitting: Emergency Medicine

## 2015-06-14 DIAGNOSIS — D696 Thrombocytopenia, unspecified: Secondary | ICD-10-CM | POA: Diagnosis not present

## 2015-06-14 DIAGNOSIS — E785 Hyperlipidemia, unspecified: Secondary | ICD-10-CM | POA: Insufficient documentation

## 2015-06-14 DIAGNOSIS — J069 Acute upper respiratory infection, unspecified: Secondary | ICD-10-CM

## 2015-06-14 DIAGNOSIS — H6593 Unspecified nonsuppurative otitis media, bilateral: Secondary | ICD-10-CM | POA: Diagnosis not present

## 2015-06-14 DIAGNOSIS — Z5181 Encounter for therapeutic drug level monitoring: Secondary | ICD-10-CM

## 2015-06-14 DIAGNOSIS — Z87891 Personal history of nicotine dependence: Secondary | ICD-10-CM | POA: Insufficient documentation

## 2015-06-14 DIAGNOSIS — K219 Gastro-esophageal reflux disease without esophagitis: Secondary | ICD-10-CM | POA: Insufficient documentation

## 2015-06-14 DIAGNOSIS — R062 Wheezing: Secondary | ICD-10-CM

## 2015-06-14 DIAGNOSIS — Z8673 Personal history of transient ischemic attack (TIA), and cerebral infarction without residual deficits: Secondary | ICD-10-CM | POA: Diagnosis not present

## 2015-06-14 DIAGNOSIS — J3489 Other specified disorders of nose and nasal sinuses: Secondary | ICD-10-CM

## 2015-06-14 DIAGNOSIS — I444 Left anterior fascicular block: Secondary | ICD-10-CM | POA: Insufficient documentation

## 2015-06-14 DIAGNOSIS — R739 Hyperglycemia, unspecified: Secondary | ICD-10-CM

## 2015-06-14 DIAGNOSIS — I2510 Atherosclerotic heart disease of native coronary artery without angina pectoris: Secondary | ICD-10-CM | POA: Insufficient documentation

## 2015-06-14 DIAGNOSIS — B9789 Other viral agents as the cause of diseases classified elsewhere: Secondary | ICD-10-CM

## 2015-06-14 DIAGNOSIS — R52 Pain, unspecified: Secondary | ICD-10-CM

## 2015-06-14 DIAGNOSIS — Z7901 Long term (current) use of anticoagulants: Secondary | ICD-10-CM

## 2015-06-14 DIAGNOSIS — R05 Cough: Secondary | ICD-10-CM | POA: Diagnosis present

## 2015-06-14 DIAGNOSIS — I129 Hypertensive chronic kidney disease with stage 1 through stage 4 chronic kidney disease, or unspecified chronic kidney disease: Secondary | ICD-10-CM | POA: Diagnosis not present

## 2015-06-14 DIAGNOSIS — Z7982 Long term (current) use of aspirin: Secondary | ICD-10-CM | POA: Insufficient documentation

## 2015-06-14 DIAGNOSIS — I452 Bifascicular block: Secondary | ICD-10-CM

## 2015-06-14 DIAGNOSIS — Z79899 Other long term (current) drug therapy: Secondary | ICD-10-CM | POA: Insufficient documentation

## 2015-06-14 DIAGNOSIS — N183 Chronic kidney disease, stage 3 unspecified: Secondary | ICD-10-CM

## 2015-06-14 LAB — CBC WITH DIFFERENTIAL/PLATELET
Basophils Absolute: 0 10*3/uL (ref 0.0–0.1)
Basophils Relative: 0 %
EOS ABS: 0.1 10*3/uL (ref 0.0–0.7)
EOS PCT: 1 %
HCT: 44.1 % (ref 39.0–52.0)
Hemoglobin: 14 g/dL (ref 13.0–17.0)
LYMPHS PCT: 24 %
Lymphs Abs: 1.1 10*3/uL (ref 0.7–4.0)
MCH: 28.3 pg (ref 26.0–34.0)
MCHC: 31.7 g/dL (ref 30.0–36.0)
MCV: 89.3 fL (ref 78.0–100.0)
MONOS PCT: 15 %
Monocytes Absolute: 0.7 10*3/uL (ref 0.1–1.0)
NEUTROS ABS: 2.7 10*3/uL (ref 1.7–7.7)
Neutrophils Relative %: 60 %
PLATELETS: 142 10*3/uL — AB (ref 150–400)
RBC: 4.94 MIL/uL (ref 4.22–5.81)
RDW: 13.7 % (ref 11.5–15.5)
WBC: 4.5 10*3/uL (ref 4.0–10.5)

## 2015-06-14 LAB — I-STAT TROPONIN, ED: TROPONIN I, POC: 0 ng/mL (ref 0.00–0.08)

## 2015-06-14 LAB — PROTIME-INR
INR: 1.96 — ABNORMAL HIGH (ref 0.00–1.49)
Prothrombin Time: 22.2 seconds — ABNORMAL HIGH (ref 11.6–15.2)

## 2015-06-14 LAB — BASIC METABOLIC PANEL
ANION GAP: 13 (ref 5–15)
BUN: 26 mg/dL — AB (ref 6–20)
CHLORIDE: 104 mmol/L (ref 101–111)
CO2: 22 mmol/L (ref 22–32)
Calcium: 9.2 mg/dL (ref 8.9–10.3)
Creatinine, Ser: 1.46 mg/dL — ABNORMAL HIGH (ref 0.61–1.24)
GFR calc Af Amer: 54 mL/min — ABNORMAL LOW (ref >60)
GFR, EST NON AFRICAN AMERICAN: 46 mL/min — AB (ref >60)
GLUCOSE: 134 mg/dL — AB (ref 65–99)
POTASSIUM: 3.8 mmol/L (ref 3.5–5.1)
SODIUM: 139 mmol/L (ref 135–145)

## 2015-06-14 MED ORDER — ALBUTEROL SULFATE (2.5 MG/3ML) 0.083% IN NEBU
5.0000 mg | INHALATION_SOLUTION | Freq: Once | RESPIRATORY_TRACT | Status: AC
  Administered 2015-06-14: 5 mg via RESPIRATORY_TRACT

## 2015-06-14 MED ORDER — ALBUTEROL SULFATE (2.5 MG/3ML) 0.083% IN NEBU
5.0000 mg | INHALATION_SOLUTION | Freq: Once | RESPIRATORY_TRACT | Status: AC
Start: 2015-06-14 — End: 2015-06-14
  Administered 2015-06-14: 5 mg via RESPIRATORY_TRACT
  Filled 2015-06-14: qty 6

## 2015-06-14 MED ORDER — SODIUM CHLORIDE 0.9 % IV BOLUS (SEPSIS)
1000.0000 mL | Freq: Once | INTRAVENOUS | Status: AC
  Administered 2015-06-14: 1000 mL via INTRAVENOUS

## 2015-06-14 MED ORDER — PREDNISONE 20 MG PO TABS: ORAL_TABLET | ORAL | Status: DC

## 2015-06-14 MED ORDER — IPRATROPIUM BROMIDE 0.02 % IN SOLN
0.5000 mg | Freq: Once | RESPIRATORY_TRACT | Status: AC
  Administered 2015-06-14: 0.5 mg via RESPIRATORY_TRACT
  Filled 2015-06-14: qty 2.5

## 2015-06-14 MED ORDER — PREDNISONE 20 MG PO TABS
60.0000 mg | ORAL_TABLET | Freq: Once | ORAL | Status: AC
  Administered 2015-06-14: 60 mg via ORAL
  Filled 2015-06-14: qty 3

## 2015-06-14 MED ORDER — ALBUTEROL SULFATE HFA 108 (90 BASE) MCG/ACT IN AERS: 2.0000 | INHALATION_SPRAY | RESPIRATORY_TRACT | Status: DC | PRN

## 2015-06-14 MED ORDER — ALBUTEROL SULFATE (2.5 MG/3ML) 0.083% IN NEBU
INHALATION_SOLUTION | RESPIRATORY_TRACT | Status: AC
  Filled 2015-06-14: qty 6

## 2015-06-14 NOTE — Discharge Instructions (Signed)
Your symptoms are likely due to a viral upper respiratory infection. Continue to stay well-hydrated with plenty of water. Gargle warm salt water and spit it out. Use chloraseptic spray as needed for sore throat. Continue to alternate between Tylenol and Ibuprofen for pain or fever. Use Mucinex for cough suppression/expectoration of mucus. Use netipot and flonase to help with nasal congestion. May consider over-the-counter Benadryl or other antihistamine to decrease secretions and for watery itchy eyes. Use inhaler as directed, as needed for cough/chest congestion/wheezing. Take prednisone as directed, starting on 06/15/15 since you received your first dose here today. Followup with your primary care doctor in 5-7 days for recheck of ongoing symptoms. Return to emergency department for emergent changing or worsening of symptoms.   Cough, Adult A cough helps to clear your throat and lungs. A cough may last only 2-3 weeks (acute), or it may last longer than 8 weeks (chronic). Many different things can cause a cough. A cough may be a sign of an illness or another medical condition. HOME CARE 1. Pay attention to any changes in your cough. 2. Take medicines only as told by your doctor. 1. If you were prescribed an antibiotic medicine, take it as told by your doctor. Do not stop taking it even if you start to feel better. 2. Talk with your doctor before you try using a cough medicine. 3. Drink enough fluid to keep your pee (urine) clear or pale yellow. 4. If the air is dry, use a cold steam vaporizer or humidifier in your home. 5. Stay away from things that make you cough at work or at home. 6. If your cough is worse at night, try using extra pillows to raise your head up higher while you sleep. 7. Do not smoke, and try not to be around smoke. If you need help quitting, ask your doctor. 8. Do not have caffeine. 9. Do not drink alcohol. 10. Rest as needed. GET HELP IF:  You have new problems  (symptoms).  You cough up yellow fluid (pus).  Your cough does not get better after 2-3 weeks, or your cough gets worse.  Medicine does not help your cough and you are not sleeping well.  You have pain that gets worse or pain that is not helped with medicine.  You have a fever.  You are losing weight and you do not know why.  You have night sweats. GET HELP RIGHT AWAY IF:  You cough up blood.  You have trouble breathing.  Your heartbeat is very fast.   This information is not intended to replace advice given to you by your health care provider. Make sure you discuss any questions you have with your health care provider.   Document Released: 12/01/2010 Document Revised: 12/09/2014 Document Reviewed: 05/27/2014 Elsevier Interactive Patient Education 2016 [employer]   Viral Infections A virus is a type of germ. Viruses can cause: 11. Minor sore throats. 12. Aches and pains. 13. Headaches. 14. Runny nose. 15. Rashes. 16. Watery eyes. 17. Tiredness. 18. Coughs. 19. Loss of appetite. 20. Feeling sick to your stomach (nausea). 21. Throwing up (vomiting). 22. Watery poop (diarrhea). HOME CARE   Only take medicines as told by your doctor.  Drink enough water and fluids to keep your pee (urine) clear or pale yellow. Sports drinks are a good choice.  Get plenty of rest and eat healthy. Soups and broths with crackers or rice are fine. GET HELP RIGHT AWAY IF:   You have a very bad  headache.  You have shortness of breath.  You have chest pain or neck pain.  You have an unusual rash.  You cannot stop throwing up.  You have watery poop that does not stop.  You cannot keep fluids down.  You or your child has a temperature by mouth above 102 F (38.9 C), not controlled by medicine.  Your baby is older than 3 months with a rectal temperature of 102 F (38.9 C) or higher.  Your baby is 55 months old or younger with a rectal temperature of 100.4 F (38 C) or  higher. MAKE SURE YOU:   Understand these instructions.  Will watch this condition.  Will get help right away if you are not doing well or get worse.   This information is not intended to replace advice given to you by your health care provider. Make sure you discuss any questions you have with your health care provider.   Document Released: 03/02/2008 Document Revised: 06/12/2011 Document Reviewed: 08/26/2014 Elsevier Interactive Patient Education 2016 [employer].  How to Use an Inhaler Using your inhaler correctly is very important. Good technique will make sure that the medicine reaches your lungs.  HOW TO USE AN INHALER: 23. Take the cap off the inhaler. 24. If this is the first time using your inhaler, you need to prime it. Shake the inhaler for 5 seconds. Release four puffs into the air, away from your face. Ask your doctor for help if you have questions. 25. Shake the inhaler for 5 seconds. 26. Turn the inhaler so the bottle is above the mouthpiece. 27. Put your pointer finger on top of the bottle. Your thumb holds the bottom of the inhaler. 28. Open your mouth. 29. Either hold the inhaler away from your mouth (the width of 2 fingers) or place your lips tightly around the mouthpiece. Ask your doctor which way to use your inhaler. 30. Breathe out as much air as possible. 31. Breathe in and push down on the bottle 1 time to release the medicine. You will feel the medicine go in your mouth and throat. 32. Continue to take a deep breath in very slowly. Try to fill your lungs. 33. After you have breathed in completely, hold your breath for 10 seconds. This will help the medicine to settle in your lungs. If you cannot hold your breath for 10 seconds, hold it for as long as you can before you breathe out. 34. Breathe out slowly, through pursed lips. Whistling is an example of pursed lips. 35. If your doctor has told you to take more than 1 puff, wait at least 15-30 seconds between  puffs. This will help you get the best results from your medicine. Do not use the inhaler more than your doctor tells you to. 36. Put the cap back on the inhaler. 37. Follow the directions from your doctor or from the inhaler package about cleaning the inhaler. If you use more than one inhaler, ask your doctor which inhalers to use and what order to use them in. Ask your doctor to help you figure out when you will need to refill your inhaler.  If you use a steroid inhaler, always rinse your mouth with water after your last puff, gargle and spit out the water. Do not swallow the water. GET HELP IF:  The inhaler medicine only partially helps to stop wheezing or shortness of breath.  You are having trouble using your inhaler.  You have some increase in thick spit (phlegm).  GET HELP RIGHT AWAY IF:  The inhaler medicine does not help your wheezing or shortness of breath or you have tightness in your chest.  You have dizziness, headaches, or fast heart rate.  You have chills, fever, or night sweats.  You have a large increase of thick spit, or your thick spit is bloody. MAKE SURE YOU:   Understand these instructions.  Will watch your condition.  Will get help right away if you are not doing well or get worse.   This information is not intended to replace advice given to you by your health care provider. Make sure you discuss any questions you have with your health care provider.   Document Released: 12/28/2007 Document Revised: 01/08/2013 Document Reviewed: 10/17/2012 Elsevier Interactive Patient Education [employer].

## 2015-06-14 NOTE — ED Notes (Addendum)
Pt c/o cough, generalized body aches and leg pain x 3 days. Pt son recently had an URI and has been coming around pt frequently.

## 2015-06-14 NOTE — ED Provider Notes (Signed)
CSN: [id number]     Arrival date & time 06/14/15  7169 History   First MD Initiated Contact with Patient 06/14/15 1130     Chief Complaint  Patient presents with  . Cough  . Generalized Body Aches  . Leg Pain     (Consider location/radiation/quality/duration/timing/severity/associated sxs/prior Treatment) HPI Comments: Dylan Morgan is a 70 y.o. male with a PMHx of CAD s/p CABG, GERD, HTN, HLD, renal insufficiency, tobacco use, AAA s/p repair stenting, paroxysmal Afib on coumadin, carotid artery disease, prediabetes, and remote CVA, who presents to the ED with complaints of URI symptoms that began gradually 3 days ago. His symptoms include generalized body aches, cough with yellowish sputum production, wheezing, ear fullness bilaterally, clear rhinorrhea, and sore throat. He was given albuterol treatment in the triage area with some improvement in his symptoms, and at home he was using antihistamines with some improvement, denies any known aggravating factors. Positive sick contacts at home. Nonsmoker with no history of COPD or asthma. He states that 2 weeks ago he was seen by his primary care doctor for very similar symptoms, was prescribed Tamiflu but unfortunately could not afford it and never got it filled, states that that illness self resolved and he was feeling better prior to 3 days ago.  He denies any fevers, chills, chest pain, shortness of breath, trismus, drooling, ear drainage, abdominal pain, nausea, vomiting, diarrhea, constipation, dysuria, hematuria, numbness, tingling, weakness, diaphoresis, or lightheadedness. He denies any recent travel.  Patient is a 70 y.o. male presenting with cough and leg pain. The history is provided by the patient and medical records. No language interpreter was used.  Cough Cough characteristics:  Productive Sputum characteristics:  Yellow Severity:  Moderate Onset quality:  Gradual Duration:  3 days Timing:  Constant Progression:   Unchanged Chronicity:  Recurrent Smoker: no   Context: sick contacts and upper respiratory infection   Relieved by:  Beta-agonist inhaler (and antihistamines) Worsened by:  Nothing tried Ineffective treatments:  None tried Associated symptoms: ear pain (fullness), myalgias, rhinorrhea, sore throat and wheezing   Associated symptoms: no chest pain, no chills, no diaphoresis, no fever and no shortness of breath   Risk factors: recent infection (URI 2wks ago, self-resolved)   Risk factors: no recent travel   Leg Pain Associated symptoms: no fever     Past Medical History  Diagnosis Date  . CAD (coronary artery disease) CABG in 2004    a. 2004 s/p CABG  . GERD (gastroesophageal reflux disease)   . Hypertension   . Hyperlipidemia   . Renal insufficiency   . Tobacco abuse   . Seborrheic dermatitis of scalp   . Bradycardia   . AAA (abdominal aortic aneurysm) ([place])     a. 2010 s/p stenting  . Paroxysmal atrial fibrillation ([place]) 02/22/2009    a. on Coumadin   . Carotid artery disease ([place])     a. 2012 dopplers with old LICA occlusion , RICA no significant abnormality  . Arthritis   . Prediabetes   . CVA (cerebral infarction)     a. 2013 R Carona radiata stroke    Past Surgical History  Procedure Laterality Date  . Coronary artery bypass graft  2004  . Rotator cuff repair    . Abdominal aortic aneurysm repair  2010    Aortic stent repair   Family History  Problem Relation Age of Onset  . Diabetes Mother   . Stroke Father   . Thyroid cancer Sister   .  Heart disease Brother     Coronary artery disease   Social History  Substance Use Topics  . Smoking status: Former Smoker -- 0.00 packs/day for 35 years    Types: Cigarettes    Quit date: 02/03/2010  . Smokeless tobacco: Never Used  . Alcohol Use: No    Review of Systems  Constitutional: Negative for fever, chills and diaphoresis.  HENT: Positive for ear pain (fullness), rhinorrhea and sore throat. Negative for ear  discharge.   Respiratory: Positive for cough and wheezing. Negative for shortness of breath.   Cardiovascular: Negative for chest pain.  Gastrointestinal: Negative for nausea, vomiting, abdominal pain, diarrhea and constipation.  Genitourinary: Negative for dysuria and hematuria.  Musculoskeletal: Positive for myalgias. Negative for arthralgias.  Skin: Negative for color change.  Allergic/Immunologic: Negative for immunocompromised state.  Neurological: Negative for weakness, light-headedness and numbness.  Hematological: Bruises/bleeds easily (on coumadin).  Psychiatric/Behavioral: Negative for confusion.   10 Systems reviewed and are negative for acute change except as noted in the HPI.    Allergies  Diltiazem hcl and Oxycodone-acetaminophen  Home Medications   Prior to Admission medications   Medication Sig Start Date End Date Taking? Authorizing Provider  aspirin EC 81 MG tablet Take 81 mg by mouth every other day. After supper    Historical Provider, MD  gabapentin (NEURONTIN) 300 MG capsule Take 1 capsule (300 mg total) by mouth at bedtime. 06/01/15 05/31/16  Aldine Contes, MD  lisinopril (PRINIVIL,ZESTRIL) 40 MG tablet Take 1 tablet (40 mg total) by mouth daily. 06/01/15 05/31/16  Aldine Contes, MD  oseltamivir (TAMIFLU) 30 MG capsule Take 1 capsule (30 mg total) by mouth 2 (two) times daily. 06/03/15   Nischal Narendra, MD  simvastatin (ZOCOR) 40 MG tablet Take 1 tablet (40 mg total) by mouth daily after supper. 06/22/14   Liliane Shi, PA-C  vitamin B-12 (CYANOCOBALAMIN) 1000 MCG tablet Take 1,000 mcg by mouth daily.    Historical Provider, MD  warfarin (COUMADIN) 5 MG tablet TAKE ONE TABLET BY MOUTH AS DIRECTED 05/03/15   Carlena Bjornstad, MD   BP 140/88 mmHg  Pulse 68  Temp(Src) 97.6 F (36.4 C) (Oral)  Resp 22  Ht '5\' 6"'$  (1.676 m)  Wt 104.327 kg  BMI 37.14 kg/m2  SpO2 95% Physical Exam  Constitutional: He is oriented to person, place, and time. Vital signs are normal.  He appears well-developed and well-nourished.  Non-toxic appearance. No distress.  Afebrile, nontoxic, NAD, elderly gentleman  HENT:  Head: Normocephalic and atraumatic.  Right Ear: Hearing, external ear and ear canal normal. Tympanic membrane is not injected and not bulging. A middle ear effusion is present.  Left Ear: Hearing, external ear and ear canal normal. Tympanic membrane is not injected and not bulging. A middle ear effusion is present.  Nose: Mucosal edema and rhinorrhea present.  Mouth/Throat: Uvula is midline, oropharynx is clear and moist and mucous membranes are normal. No trismus in the jaw. No uvula swelling.  Ears with bilateral serous effusions but no erythematous or bulging TM, canals clear. Nose with mucosal edema and clear rhinorrhea. Oropharynx clear and moist, without uvular swelling or deviation, no trismus or drooling, no tonsillar swelling or erythema, no exudates.    Eyes: Conjunctivae and EOM are normal. Right eye exhibits no discharge. Left eye exhibits no discharge.  Neck: Normal range of motion. Neck supple.  Cardiovascular: Normal rate, regular rhythm, normal heart sounds and intact distal pulses.  Exam reveals no gallop and no friction rub.  No murmur heard. RRR, nl s1/s2, no m/r/g, distal pulses intact, very trace b/l pedal edema, neg homan's sign bilaterally  Pulmonary/Chest: Effort normal. No respiratory distress. He has no decreased breath sounds. He has wheezes. He has no rhonchi. He has no rales.  Diffuse expiratory wheezing throughout all lung fields best heard over anterior chest fields, no focal rhonchi or rales, no hypoxia or increased WOB, speaking in full sentences, SpO2 95-98% on RA   Abdominal: Soft. Normal appearance and bowel sounds are normal. He exhibits no distension. There is no tenderness. There is no rigidity, no rebound, no guarding, no CVA tenderness, no tenderness at McBurney's point and negative Murphy's sign.  Musculoskeletal: Normal  range of motion.  Lymphadenopathy:    He has cervical adenopathy.  Shotty cervical LAD bilaterally which is nonTTP  Neurological: He is alert and oriented to person, place, and time. He has normal strength. No sensory deficit.  Skin: Skin is warm, dry and intact. No rash noted.  Psychiatric: He has a normal mood and affect.  Nursing note and vitals reviewed.   ED Course  Procedures (including critical care time) Labs Review Labs Reviewed  CBC WITH DIFFERENTIAL/PLATELET - Abnormal; Notable for the following:    Platelets 142 (*)    All other components within normal limits  PROTIME-INR - Abnormal; Notable for the following:    Prothrombin Time 22.2 (*)    INR 1.96 (*)    All other components within normal limits  BASIC METABOLIC PANEL - Abnormal; Notable for the following:    Glucose, Bld 134 (*)    BUN 26 (*)    Creatinine, Ser 1.46 (*)    GFR calc non Af Amer 46 (*)    GFR calc Af Amer 54 (*)    All other components within normal limits  I-STAT TROPOININ, ED    Imaging Review Dg Chest 2 View  06/14/2015  CLINICAL DATA:  Weakness, shortness of breath and cough since Thursday, hypertension, coronary artery disease, hyperlipidemia, former smoker, paroxysmal atrial fibrillation, history stroke EXAM: CHEST  2 VIEW COMPARISON:  04/27/2015 FINDINGS: Enlargement of cardiac silhouette post CABG. Tortuous aorta. Mediastinal contours and pulmonary vascularity normal. Minimal linear scarring at RIGHT base. No definite acute infiltrate, pleural effusion or pneumothorax. No acute osseous findings. IMPRESSION: Enlargement of cardiac silhouette post CABG. Minimal RIGHT basilar scarring without definite acute infiltrate. Electronically Signed   By: Lavonia Dana M.D.   On: 06/14/2015 10:27    Echo 11/2011: Study Conclusions Left ventricle: The cavity size was normal. Wall thickness was increased in a pattern of moderate LVH. Systolic function was vigorous. The estimated ejection fraction  was in the range of 65% to 70%. Wall motion was normal; there were no regional wall motion abnormalities. Left ventricular diastolic function parameters were normal.  NucMed Stress test 08/2013: negative per notes  I have personally reviewed and evaluated these images and lab results as part of my medical decision-making.   EKG Interpretation   Date/Time:  Monday June 14 2015 13:49:28 EDT Ventricular Rate:  58 PR Interval:  173 QRS Duration: 103 QT Interval:  463 QTC Calculation: 455 R Axis:   -41 Text Interpretation:  Sinus rhythm Incomplete RBBB and LAFB Low voltage,  precordial leads Consider right ventricular hypertrophy Baseline wander in  lead(s) I II aVR No significant change since last tracing Confirmed by YAO   MD, DAVID ([id number]) on 06/14/2015 2:04:03 PM      MDM   Final diagnoses:  Viral URI with  cough  Body aches  Rhinorrhea  Otitis media with effusion, bilateral  Wheezing  Thrombocytopenia (HCC)  Anticoagulation goal of INR 2 to 3  CKD (chronic kidney disease), stage III  Hyperglycemia, unspecified  RBBB (right bundle branch block with left anterior fascicular block)    70 y.o. male here with cough and body aches with URI symptoms x3 days, some wheezing as well. Ear fullness bilaterally with b/l serous effusions but no evidence of AOM. On exam, wheezing throughout all lung fields, no rhonchi or rales; throat clear; nose with mild mucosal edema and rhinorrhea. Overall, appears to be likely viral URI. Outside of window where tamiflu would help, and 2wks ago he was prescribed this but couldn't afford it so he didn't get it filled, and his symptoms resolved on their own at that time. Doubt that testing at this point for flu would be beneficial. CXR with no acute findings, shows some mild R basilar scarring without infiltrate and enlargement of cardiac silhouette. Will get screening labs, trop, EKG, and INR given pt's age and comorbidities. Got albuterol which he stated  helped some. Will give fluids (pt with preserved EF on prior echo's) and prednisone/nebs. Discussed case with my attending Dr. Darl Householder who agrees with plan. Will reassess shortly.   2:24 PM CBC w/diff showing plt 142 which is similar to prior results; no leukocytosis, which is reassuring. INR 1.96, very near goal of 2.0, doubt need for adjustments. BMP with baseline BUN/Cr, mildly elevated gluc at 134, similar to prior readings. Trop WNL. EKG with RBBB and low-voltage tracing but no significant change from prior EKGs, no acute ischemic findings. Pt feeling much better after second neb tx, wheezing resolved, states that the fluids are helping his symptoms. Only ~25% done with fluids, will allow these to finish and then d/c home with prednisone and albuterol inhaler. F/up with PCP in 1wk. Discussed OTC symptomatic care. Dr. Darl Householder saw pt and agrees with plan. Will d/c home shortly after fluids finish.  3:06 PM Fluids finished, pt feeling improved. D/c home with previously discussed plan. I explained the diagnosis and have given explicit precautions to return to the ER including for any other new or worsening symptoms. The patient understands and accepts the medical plan as it's been dictated and I have answered their questions. Discharge instructions concerning home care and prescriptions have been given. The patient is STABLE and is discharged to home in good condition.   BP 145/77 mmHg  Pulse 60  Temp(Src) 97.6 F (36.4 C) (Oral)  Resp 13  Ht '5\' 6"'$  (1.676 m)  Wt 104.327 kg  BMI 37.14 kg/m2  SpO2 95%  Meds ordered this encounter  Medications  . albuterol (PROVENTIL) (2.5 MG/3ML) 0.083% nebulizer solution 5 mg    Sig:   . albuterol (PROVENTIL) (2.5 MG/3ML) 0.083% nebulizer solution    Sig:     Matthew Folks   : cabinet override  . sodium chloride 0.9 % bolus 1,000 mL    Sig:   . albuterol (PROVENTIL) (2.5 MG/3ML) 0.083% nebulizer solution 5 mg    Sig:   . ipratropium (ATROVENT) nebulizer solution  0.5 mg    Sig:   . predniSONE (DELTASONE) tablet 60 mg    Sig:   . predniSONE (DELTASONE) 20 MG tablet    Sig: 3 tabs po daily x 3 days starting on 06/15/15    Dispense:  9 tablet    Refill:  0    Order Specific Question:  Supervising Provider  Answer:  MILLER, Percy  . albuterol (PROVENTIL HFA;VENTOLIN HFA) 108 (90 Base) MCG/ACT inhaler    Sig: Inhale 2 puffs into the lungs every 4 (four) hours as needed for wheezing or shortness of breath (cough).    Dispense:  1 Inhaler    Refill:  0    Order Specific Question:  Supervising Provider    Answer:  Jenny Reichmann Camprubi-Soms, PA-C 06/14/15 Pinch Yao, MD 06/14/15 1546

## 2015-06-22 ENCOUNTER — Inpatient Hospital Stay: Admission: RE | Admit: 2015-06-22 | Payer: Medicare HMO | Source: Ambulatory Visit

## 2015-06-23 ENCOUNTER — Encounter: Payer: Self-pay | Admitting: Family

## 2015-06-25 ENCOUNTER — Ambulatory Visit (INDEPENDENT_AMBULATORY_CARE_PROVIDER_SITE_OTHER): Payer: Medicare HMO | Admitting: *Deleted

## 2015-06-25 DIAGNOSIS — I4891 Unspecified atrial fibrillation: Secondary | ICD-10-CM | POA: Diagnosis not present

## 2015-06-25 DIAGNOSIS — Z7901 Long term (current) use of anticoagulants: Secondary | ICD-10-CM | POA: Diagnosis not present

## 2015-06-25 DIAGNOSIS — I639 Cerebral infarction, unspecified: Secondary | ICD-10-CM

## 2015-06-25 DIAGNOSIS — I480 Paroxysmal atrial fibrillation: Secondary | ICD-10-CM

## 2015-06-25 LAB — POCT INR: INR: 2.2

## 2015-06-29 ENCOUNTER — Ambulatory Visit: Payer: Medicare HMO | Admitting: Family

## 2015-07-02 ENCOUNTER — Encounter: Payer: Self-pay | Admitting: Family

## 2015-07-02 ENCOUNTER — Telehealth: Payer: Self-pay | Admitting: Vascular Surgery

## 2015-07-02 ENCOUNTER — Ambulatory Visit (INDEPENDENT_AMBULATORY_CARE_PROVIDER_SITE_OTHER): Payer: Medicare HMO | Admitting: Family

## 2015-07-02 ENCOUNTER — Other Ambulatory Visit: Payer: Self-pay | Admitting: Physician Assistant

## 2015-07-02 VITALS — BP 173/92 | HR 80 | Temp 97.2°F | Resp 22 | Ht 67.0 in | Wt 232.0 lb

## 2015-07-02 DIAGNOSIS — Z48812 Encounter for surgical aftercare following surgery on the circulatory system: Secondary | ICD-10-CM

## 2015-07-02 DIAGNOSIS — I6522 Occlusion and stenosis of left carotid artery: Secondary | ICD-10-CM

## 2015-07-02 DIAGNOSIS — I714 Abdominal aortic aneurysm, without rupture: Secondary | ICD-10-CM

## 2015-07-02 DIAGNOSIS — I6521 Occlusion and stenosis of right carotid artery: Secondary | ICD-10-CM

## 2015-07-02 DIAGNOSIS — Z95828 Presence of other vascular implants and grafts: Secondary | ICD-10-CM | POA: Diagnosis not present

## 2015-07-02 DIAGNOSIS — Z87891 Personal history of nicotine dependence: Secondary | ICD-10-CM

## 2015-07-02 NOTE — Progress Notes (Signed)
[facility] [place]  Established [facility]  History of Present Illness  Dylan Morgan is a 70 y.o. (April 16, 1945) male patient of Dr. Kellie Simmering who returns for continued followup regarding his aortic stent graft which was placed in 2010 for abdominal aortic aneurysm and his known left ICA occlusion. Previously he has had mild right ICA stenosis and he has been asymptomatic. 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 He has no active symptoms of lateralizing weakness, any aphasia, visual loss in the right eye. He has occasional numbness in both feet. But denies claudication symptoms. He returns today for discussion of his  CTA abd/pelvis results; he apparently did not have this done, no results on file. After calling radiology dept at [facility], no CT has been done since 12/12/13. He had a CXR recently, evaluated in the ED for a URI.   Pt states he had a TIA or stroke about 2014 as manifested by numbness on possibly left arm and leg, some slurred speech, states he was taken to [facility] ED and given and IV blood thinner. He has not had subsequent stroke or TIA. Dr. Kellie Simmering last saw pt on 12/23/13. At that time CT angiogram of the abdomen and pelvis were reviewed by computer and also carotid duplex exam reviewed. There was no evidence of endoleak. The aneurysm stent graft was in excellent position with a maximum diameter of 5.2 cm which was stable. Right ICA remained minimally stenotic with less than 40% stenosis with known left ICA occlusion.  Pt states his feet feel numb since about January 2016, is not getting worse, he denies any known history of DM. He does report known lumbar curvature problem, relieved by chiropractic care; states he also has known c-spine issues.  He has chronic low back pain, occassionally has left radiculopathy type pain. He denies any new back pain, denies abdominal pain.  Pt states he bumped his left wrist and is wearing a wrist  splint.  Both hands occasionally have tingling and numbness.  Pt Diabetic: No Pt smoker: former smoker, quit in 2011  Pt meds include: Statin :Yes Betablocker: No ASA: Yes Other anticoagulants/antiplatelets: coumadin, states he has atrial fib     Past Medical History  Diagnosis Date  . CAD (coronary artery disease) CABG in 2004    a. 2004 s/p CABG  . GERD (gastroesophageal reflux disease)   . Hypertension   . Hyperlipidemia   . Renal insufficiency   . Tobacco abuse   . Seborrheic dermatitis of scalp   . Bradycardia   . AAA (abdominal aortic aneurysm) ([place])     a. 2010 s/p stenting  . Paroxysmal atrial fibrillation ([place]) 02/22/2009    a. on Coumadin   . Carotid artery disease ([place])     a. 2012 dopplers with old LICA occlusion , RICA no significant abnormality  . Arthritis   . Prediabetes   . CVA (cerebral infarction)     a. 2013 R Carona radiata stroke    Past Surgical History  Procedure Laterality Date  . Coronary artery bypass graft  2004  . Rotator cuff repair    . Abdominal aortic aneurysm repair  2010    Aortic stent repair   Social History Social History  Substance Use Topics  . Smoking status: Former Smoker -- 0.00 packs/day for 35 years    Types: Cigarettes    Quit date: 02/03/2010  . Smokeless tobacco: Never Used  .  Alcohol Use: No   Family History Family History  Problem Relation Age of Onset  . Diabetes Mother   . Stroke Father   . Thyroid cancer Sister   . Heart disease Brother     Coronary artery disease   Current Outpatient Prescriptions on File Prior to Visit  Medication Sig Dispense Refill  . albuterol (PROVENTIL HFA;VENTOLIN HFA) 108 (90 Base) MCG/ACT inhaler Inhale 2 puffs into the lungs every 4 (four) hours as needed for wheezing or shortness of breath (cough). 1 Inhaler 0  . aspirin EC 81 MG tablet Take 81 mg by mouth every other day. After supper    . gabapentin (NEURONTIN) 300 MG capsule Take 1 capsule (300 mg total) by mouth at  bedtime. 30 capsule 2  . lisinopril (PRINIVIL,ZESTRIL) 40 MG tablet Take 1 tablet (40 mg total) by mouth daily. 30 tablet 3  . oseltamivir (TAMIFLU) 30 MG capsule Take 1 capsule (30 mg total) by mouth 2 (two) times daily. (Patient not taking: Reported on 06/14/2015) 10 capsule 0  . predniSONE (DELTASONE) 20 MG tablet 3 tabs po daily x 3 days starting on 06/15/15 9 tablet 0  . simvastatin (ZOCOR) 40 MG tablet Take 1 tablet (40 mg total) by mouth daily after supper. 90 tablet 3  . warfarin (COUMADIN) 5 MG tablet TAKE ONE TABLET BY MOUTH AS DIRECTED 35 tablet 2   No current facility-administered medications on file prior to visit.   Allergies  Allergen Reactions  . Oxycodone-Acetaminophen Other (See Comments)    vertigo  . Diltiazem Hcl Itching     ROS: See HPI for pertinent positives and negatives.  Physical Examination  Filed Vitals:   07/02/15 1029 07/02/15 1033  BP: 170/100 173/92  Pulse: 98 80  Temp: 97.2 F (36.2 C)   TempSrc: Oral   Resp: 22   Height: '5\' 7"'$  (1.702 m)   Weight: 232 lb (105.235 kg)   SpO2: 94%    Body mass index is 36.33 kg/(m^2).    Pt to return, will perform physical exam on his return after CTA abd/pelvis done              Medical Decision Making  [name] is a 70 y.o. male who is s/p endovascular Aortic Repair in 2010 and has a known left ICA occlusion. Pt reports a stroke or TIA in 2014 with no residual neurological deficits, no subsequent stroke or TIA. He returned today for discussion of the results of his CTA abd/pelvis, but this imaging was not performed.  Will again attempt to schedule CTA abd/pelvis to evaluate an increase in the size of aneurysmal extra-stent sac size from 5.2 cm by CT in 2015, to 5.64 cm by duplex in September, 2016; follow up with Dr. Kellie Simmering for discussion of results.   Clemon Chambers, RN, MSN, FNP-C [facility] [place] Office: [phone]  Clinic Physician: Bridgett Larsson  07/02/2015,  10:24 AM

## 2015-07-02 NOTE — Patient Instructions (Signed)
Stroke Prevention Some medical conditions and behaviors are associated with an increased chance of having a stroke. You may prevent a stroke by making healthy choices and managing medical conditions. HOW CAN I REDUCE MY RISK OF HAVING A STROKE?   Stay physically active. Get at least 30 minutes of activity on most or all days.  Do not smoke. It may also be helpful to avoid exposure to secondhand smoke.  Limit alcohol use. Moderate alcohol use is considered to be:  No more than 2 drinks per day for men.  No more than 1 drink per day for nonpregnant women.  Eat healthy foods. This involves:  Eating 5 or more servings of fruits and vegetables a day.  Making dietary changes that address high blood pressure (hypertension), high cholesterol, diabetes, or obesity.  Manage your cholesterol levels.  Making food choices that are high in fiber and low in saturated fat, trans fat, and cholesterol may control cholesterol levels.  Take any prescribed medicines to control cholesterol as directed by your health care provider.  Manage your diabetes.  Controlling your carbohydrate and sugar intake is recommended to manage diabetes.  Take any prescribed medicines to control diabetes as directed by your health care provider.  Control your hypertension.  Making food choices that are low in salt (sodium), saturated fat, trans fat, and cholesterol is recommended to manage hypertension.  Ask your health care provider if you need treatment to lower your blood pressure. Take any prescribed medicines to control hypertension as directed by your health care provider.  If you are 18-39 years of age, have your blood pressure checked every 3-5 years. If you are 40 years of age or older, have your blood pressure checked every year.  Maintain a healthy weight.  Reducing calorie intake and making food choices that are low in sodium, saturated fat, trans fat, and cholesterol are recommended to manage  weight.  Stop drug abuse.  Avoid taking birth control pills.  Talk to your health care provider about the risks of taking birth control pills if you are over 35 years old, smoke, get migraines, or have ever had a blood clot.  Get evaluated for sleep disorders (sleep apnea).  Talk to your health care provider about getting a sleep evaluation if you snore a lot or have excessive sleepiness.  Take medicines only as directed by your health care provider.  For some people, aspirin or blood thinners (anticoagulants) are helpful in reducing the risk of forming abnormal blood clots that can lead to stroke. If you have the irregular heart rhythm of atrial fibrillation, you should be on a blood thinner unless there is a good reason you cannot take them.  Understand all your medicine instructions.  Make sure that other conditions (such as anemia or atherosclerosis) are addressed. SEEK IMMEDIATE MEDICAL CARE IF:   You have sudden weakness or numbness of the face, arm, or leg, especially on one side of the body.  Your face or eyelid droops to one side.  You have sudden confusion.  You have trouble speaking (aphasia) or understanding.  You have sudden trouble seeing in one or both eyes.  You have sudden trouble walking.  You have dizziness.  You have a loss of balance or coordination.  You have a sudden, severe headache with no known cause.  You have new chest pain or an irregular heartbeat. Any of these symptoms may represent a serious problem that is an emergency. Do not wait to see if the symptoms will   go away. Get medical help at once. Call your local emergency services (911 in [place] Do not drive yourself to the hospital.   This information is not intended to replace advice given to you by your health care provider. Make sure you discuss any questions you have with your health care provider.   Document Released: 04/27/2004 Document Revised: 04/10/2014 Document Reviewed:  09/20/2012 Elsevier Interactive Patient Education 2016 [employer]  

## 2015-07-02 NOTE — Telephone Encounter (Signed)
Spoke with pt. CTA 07/08/15 11:30 am 301. No solids 4 hrs prior.  [facility] appt 07/13/15 4pm. Pt verbalized understanding.

## 2015-07-06 ENCOUNTER — Encounter: Payer: Self-pay | Admitting: Family

## 2015-07-06 ENCOUNTER — Ambulatory Visit (HOSPITAL_COMMUNITY): Payer: Commercial Managed Care - HMO | Attending: Internal Medicine

## 2015-07-06 ENCOUNTER — Ambulatory Visit (INDEPENDENT_AMBULATORY_CARE_PROVIDER_SITE_OTHER): Payer: Commercial Managed Care - HMO | Admitting: Internal Medicine

## 2015-07-06 ENCOUNTER — Encounter: Payer: Self-pay | Admitting: Internal Medicine

## 2015-07-06 VITALS — BP 148/82 | HR 63 | Temp 97.7°F | Ht 67.0 in | Wt 237.0 lb

## 2015-07-06 DIAGNOSIS — R001 Bradycardia, unspecified: Secondary | ICD-10-CM | POA: Insufficient documentation

## 2015-07-06 DIAGNOSIS — I452 Bifascicular block: Secondary | ICD-10-CM | POA: Diagnosis not present

## 2015-07-06 DIAGNOSIS — G6289 Other specified polyneuropathies: Secondary | ICD-10-CM

## 2015-07-06 DIAGNOSIS — I129 Hypertensive chronic kidney disease with stage 1 through stage 4 chronic kidney disease, or unspecified chronic kidney disease: Secondary | ICD-10-CM

## 2015-07-06 DIAGNOSIS — I10 Essential (primary) hypertension: Secondary | ICD-10-CM

## 2015-07-06 DIAGNOSIS — I480 Paroxysmal atrial fibrillation: Secondary | ICD-10-CM

## 2015-07-06 DIAGNOSIS — N183 Chronic kidney disease, stage 3 unspecified: Secondary | ICD-10-CM

## 2015-07-06 DIAGNOSIS — G629 Polyneuropathy, unspecified: Secondary | ICD-10-CM

## 2015-07-06 DIAGNOSIS — I714 Abdominal aortic aneurysm, without rupture, unspecified: Secondary | ICD-10-CM

## 2015-07-06 MED ORDER — GABAPENTIN 300 MG PO CAPS: 300.0000 mg | ORAL_CAPSULE | Freq: Every day | ORAL | Status: DC

## 2015-07-06 NOTE — Assessment & Plan Note (Signed)
-   s/p stenting - Patient to f/u with vascular surgery - Scheduled for CT abd on April 6th and then will f/u with vascular

## 2015-07-06 NOTE — Progress Notes (Signed)
   Subjective:    Patient ID: Dylan Morgan, male    DOB: 1945/04/08, 70 y.o.   MRN: [id number]  HPI Patient seen and examined. He is here for routine follow up of his HTN. Patient was recently seen in the ED with persistent cough after likely flu diagnosis. He was treated with nebs and steroids and improved. States cough is much improved. He states he does use the inhaler intermittently since he completed his steroid course but feels much better. Patient denies any other complaints. Of note, he was noted to be bradycardic on initial vitals but repeat HR was in the 60s.   Review of Systems  Constitutional: Negative.   HENT: Negative.   Respiratory: Negative.   Cardiovascular: Negative.   Gastrointestinal: Negative.   Musculoskeletal: Negative.   Skin: Negative.   Neurological: Negative.   Psychiatric/Behavioral: Negative.        Objective:   Physical Exam  Constitutional: He is oriented to person, place, and time. He appears well-developed and well-nourished.  HENT:  Head: Normocephalic and atraumatic.  Neck: Normal range of motion. Neck supple.  Cardiovascular: Normal rate and regular rhythm.   Pulmonary/Chest: Effort normal and breath sounds normal. No respiratory distress. He has no wheezes.  Abdominal: Soft. Bowel sounds are normal. He exhibits no distension. There is no tenderness.  Musculoskeletal: Normal range of motion. He exhibits no edema or tenderness.  Lymphadenopathy:    He has no cervical adenopathy.  Neurological: He is alert and oriented to person, place, and time.  Skin: Skin is warm and dry. No rash noted. No erythema.  Psychiatric: He has a normal mood and affect. His behavior is normal.          Assessment & Plan:  Please see problem based charting for assessment and plan:

## 2015-07-06 NOTE — Assessment & Plan Note (Signed)
BP Readings from Last 3 Encounters:  07/06/15 148/82  07/02/15 173/92  06/14/15 145/77    Lab Results  Component Value Date   NA 139 06/14/2015   K 3.8 06/14/2015   CREATININE 1.46* 06/14/2015    Assessment: Blood pressure control:  fair Progress toward BP goal:   improved Comments: patient still on prior lower dose of lisinopril. Will take his increased dose of lisinopril 40 mg from today  Plan: Medications:  continue current medications Educational resources provided:   Self management tools provided:   Other plans: recent BMP noted. Creatinine is at baseline. Will recheck on next visit

## 2015-07-06 NOTE — Assessment & Plan Note (Signed)
-   BMP with stable creatinine - Will monitor closely - repeat BMP on follow up - Will consider referral to nephrology if renal function worsens

## 2015-07-06 NOTE — Assessment & Plan Note (Signed)
-   Much improved now - Will c/w gabapentin - no clear etiology for peripheral neuropathy - Will consider referral to neuro for follow up if persistent

## 2015-07-06 NOTE — Assessment & Plan Note (Addendum)
-   patient with no CP, no palpitations, no SOB - INR is therapeutic - repeat EKG today - NSR with sinus arrhythmia with rate of 64 bpm - Patient to f/u with Dr. Ron Parker

## 2015-07-06 NOTE — Patient Instructions (Signed)
-   It was a pleasure seeing you today - We will schedule a colonoscopy for you. Please follow up - Please follow up with your cardiologist - Please follow up in 3 months - You are scheduled for a CT scan for follow up of your aneurysm.

## 2015-07-06 NOTE — Assessment & Plan Note (Signed)
-   Initially noted to have HR in 30s on initial vitals but patient was asymptomatic - repeat vitals with HR in the 60s - EKG with no changes from prior with HR in the 60s - Outpatient f/u with cardio

## 2015-07-08 ENCOUNTER — Ambulatory Visit
Admission: RE | Admit: 2015-07-08 | Discharge: 2015-07-08 | Disposition: A | Discharge status: Home / Self Care | Payer: Commercial Managed Care - HMO | Source: Ambulatory Visit | Attending: Family | Admitting: Family

## 2015-07-08 DIAGNOSIS — Z48812 Encounter for surgical aftercare following surgery on the circulatory system: Secondary | ICD-10-CM

## 2015-07-08 DIAGNOSIS — I6522 Occlusion and stenosis of left carotid artery: Secondary | ICD-10-CM

## 2015-07-08 DIAGNOSIS — I714 Abdominal aortic aneurysm, without rupture: Secondary | ICD-10-CM | POA: Diagnosis not present

## 2015-07-08 DIAGNOSIS — Z95828 Presence of other vascular implants and grafts: Secondary | ICD-10-CM

## 2015-07-08 MED ORDER — IOPAMIDOL (ISOVUE-370) INJECTION 76%
75.0000 mL | Freq: Once | INTRAVENOUS | Status: AC | PRN
  Administered 2015-07-08: 75 mL via INTRAVENOUS

## 2015-07-13 ENCOUNTER — Telehealth: Payer: Self-pay | Admitting: *Deleted

## 2015-07-13 ENCOUNTER — Ambulatory Visit (INDEPENDENT_AMBULATORY_CARE_PROVIDER_SITE_OTHER): Payer: Commercial Managed Care - HMO | Admitting: Vascular Surgery

## 2015-07-13 ENCOUNTER — Encounter: Payer: Self-pay | Admitting: Vascular Surgery

## 2015-07-13 VITALS — BP 169/96 | HR 79 | Ht 67.0 in | Wt 236.8 lb

## 2015-07-13 DIAGNOSIS — I714 Abdominal aortic aneurysm, without rupture, unspecified: Secondary | ICD-10-CM

## 2015-07-13 NOTE — Telephone Encounter (Signed)
Pt needs a referral to dr lawson's office for f/u on anuerysm.

## 2015-07-13 NOTE — Progress Notes (Signed)
Subjective:     Patient ID: Dylan Morgan, male   DOB: 05/04/1945, 70 y.o.   MRN: [id number]  HPI  This 70 year old male returns for continued follow-up regarding his abdominal aortic stent graft repair performed in 2010 by me with a gore  C3 aortic endograft. Patient also has left ICA occlusion which is chronic and denies any new neurologic symptoms such as lateralizing weakness, aphasia, diplopia, syncope. He does have chronic back discomfort from arthritis which is his biggest complaint.  Past Medical History  Diagnosis Date  . CAD (coronary artery disease) CABG in 2004    a. 2004 s/p CABG  . GERD (gastroesophageal reflux disease)   . Hypertension   . Hyperlipidemia   . Renal insufficiency   . Tobacco abuse   . Seborrheic dermatitis of scalp   . Bradycardia   . AAA (abdominal aortic aneurysm) ([facility])     a. 2010 s/p stenting  . Paroxysmal atrial fibrillation ([facility]) 02/22/2009    a. on Coumadin   . Carotid artery disease ([facility])     a. 2012 dopplers with old LICA occlusion , RICA no significant abnormality  . Arthritis   . Prediabetes   . CVA (cerebral infarction)     a. 2013 R Carona radiata stroke     Social History  Substance Use Topics  . Smoking status: Former Smoker -- 0.00 packs/day for 35 years    Types: Cigarettes    Quit date: 02/03/2010  . Smokeless tobacco: Never Used  . Alcohol Use: No    Family History  Problem Relation Age of Onset  . Diabetes Mother   . Stroke Father   . Thyroid cancer Sister   . Heart disease Brother     Coronary artery disease    Allergies  Allergen Reactions  . Oxycodone-Acetaminophen Other (See Comments)    vertigo  . Diltiazem Hcl Itching     Current outpatient prescriptions:  .  albuterol (PROVENTIL HFA;VENTOLIN HFA) 108 (90 Base) MCG/ACT inhaler, Inhale 2 puffs into the lungs every 4 (four) hours as needed for wheezing or shortness of breath (cough)., Disp: 1 Inhaler, Rfl: 0 .  aspirin EC 81 MG tablet, Take 81 mg by mouth  every other day. After supper, Disp: , Rfl:  .  gabapentin (NEURONTIN) 300 MG capsule, Take 1 capsule (300 mg total) by mouth at bedtime., Disp: 30 capsule, Rfl: 2 .  lisinopril (PRINIVIL,ZESTRIL) 40 MG tablet, Take 1 tablet (40 mg total) by mouth daily., Disp: 30 tablet, Rfl: 3 .  simvastatin (ZOCOR) 40 MG tablet, TAKE ONE TABLET BY MOUTH  DAILY AFTER SUPPER, Disp: 90 tablet, Rfl: 0 .  warfarin (COUMADIN) 5 MG tablet, TAKE ONE TABLET BY MOUTH AS DIRECTED, Disp: 35 tablet, Rfl: 2  Filed Vitals:   07/13/15 1559 07/13/15 1603  BP: 165/105 169/96  Pulse: 79   Height: '5\' 7"'$  (1.702 m)   Weight: 236 lb 12.8 oz (107.412 kg)   SpO2: 96%     Body mass index is 37.08 kg/(m^2).          Review of Systems Denies chest pain , does complain of mild dyspnea on exertion and chronic back discomfort. Has ongoing tobacco abuse.     Objective:   Physical Exam BP 169/96 mmHg  Pulse 79  Ht '5\' 7"'$  (1.702 m)  Wt 236 lb 12.8 oz (107.412 kg)  BMI 37.08 kg/m2  SpO2 96%    Gen.-alert and oriented x3 in no apparent distress HEENT normal for age -obese  Lungs no rhonchi or wheezing Cardiovascular regular rhythm no murmurs carotid pulses 3+ palpable no bruits audible Abdomen soft nontender no palpable masses -obeseMusculoskeletal free of  major deformities Skin clear -no rashes Neurologic normal Lower extremities 3+ femoral and dorsalis pedis pulses palpable bilaterally with no edema   today I reviewed the CT angiogram of the abdomen and pelvis which was done a week ago which reveals no evidence of type I or type II endoleak. The aneurysm sac is only 5.3 cm now contracting around the endograft. There was question of possible 8 mm of migration of the endograft and relation to the left renal artery although this was unclear after viewing the images  Previous carotid duplex exam revealed left ICA occlusion an mild right ICA stenosis in the 40-50% range.       Assessment:      left ICA occlusion  and 50% right ICA stenosis-stable and asymptomatic  7 years status post aortobiiliac stent graft for abdominal aortic aneurysm -doing well with no evidence of endoleak and contraction  of the aneurysm sac to 5.3 cm     Plan:      return in 1 year with duplex scan of the abdominal aortic aneurysm stent graft in the office and check carotid duplex exam

## 2015-07-13 NOTE — Telephone Encounter (Signed)
Done

## 2015-07-13 NOTE — Telephone Encounter (Signed)
Pt now states he is supposed to see dr Kellie Simmering this pm for f/u aneurysm. States he needs a referral processed today for the visit. Called dr lawson's office to find out exactly what pt needed, the receptionist stated pt needed to see his assigned pcp, informed her that dr Dareen Piano is pt's pcp and he does not want to change pcp. Gave her dr Wilber Bihari info, she changed the info in [employer] system and states ref# is [id number]. She states now pt s referral can be processed. Chilon, could you check on this, pt has a 1600 appt today w/ dr Osie Cheeks ph# [phone] Ref# [id number] Pt may need referral order

## 2015-07-23 ENCOUNTER — Ambulatory Visit (INDEPENDENT_AMBULATORY_CARE_PROVIDER_SITE_OTHER): Payer: Commercial Managed Care - HMO | Admitting: Cardiology

## 2015-07-23 ENCOUNTER — Encounter: Payer: Self-pay | Admitting: Cardiology

## 2015-07-23 ENCOUNTER — Ambulatory Visit (INDEPENDENT_AMBULATORY_CARE_PROVIDER_SITE_OTHER): Payer: Commercial Managed Care - HMO | Admitting: *Deleted

## 2015-07-23 ENCOUNTER — Ambulatory Visit (INDEPENDENT_AMBULATORY_CARE_PROVIDER_SITE_OTHER): Payer: Commercial Managed Care - HMO

## 2015-07-23 VITALS — BP 170/100 | HR 61 | Ht 67.0 in | Wt 233.2 lb

## 2015-07-23 DIAGNOSIS — I4891 Unspecified atrial fibrillation: Secondary | ICD-10-CM | POA: Diagnosis not present

## 2015-07-23 DIAGNOSIS — I480 Paroxysmal atrial fibrillation: Secondary | ICD-10-CM

## 2015-07-23 DIAGNOSIS — Z7901 Long term (current) use of anticoagulants: Secondary | ICD-10-CM

## 2015-07-23 DIAGNOSIS — I639 Cerebral infarction, unspecified: Secondary | ICD-10-CM | POA: Diagnosis not present

## 2015-07-23 LAB — POCT INR: INR: 1.7

## 2015-07-23 NOTE — Progress Notes (Signed)
Electrophysiology Office Note   Date:  07/23/2015   ID:  Dylan Morgan, DOB 15-Oct-1945, MRN [id number]  PCP:  Gwynneth Aliment, MD  Cardiologist:  Myrtis Ser Primary Electrophysiologist:  Aloma Boch Jorja Loa, MD    Chief Complaint  Patient presents with  . Bradycardia  . PAF     History of Present Illness: Dylan Morgan is a 70 y.o. male who presents today for electrophysiology evaluation.   He has a history of atrial fibrillation.  He was seen in his PCP's office and was found to be bradycardic with HR in the 30s.  On recheck, HR came up to the 60s.  ECG at that time showed sinus rhythm in the 60s.  He is on coumadin for his atrial fibrillation.  He says he feels well. He did not notice bradycardia symptoms when he was seen in his primary docs office. He says he has not had syncope or chest pain. 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   Today, he denies symptoms of palpitations, chest pain, orthopnea, PND, lower extremity edema, claudication, dizziness, presyncope, syncope, bleeding, or neurologic sequela. The patient is tolerating medications without difficulties and is otherwise without complaint today.    Past Medical History  Diagnosis Date  . CAD (coronary artery disease) CABG in 2004    a. 2004 s/p CABG  . GERD (gastroesophageal reflux disease)   . Hypertension   . Hyperlipidemia   . Renal insufficiency   . Tobacco abuse   . Seborrheic dermatitis of scalp   . Bradycardia   . AAA (abdominal aortic aneurysm) ([facility])     a. 2010 s/p stenting  . Paroxysmal atrial fibrillation ([facility]) 02/22/2009    a. on Coumadin   . Carotid artery disease ([facility])     a. 2012 dopplers with old LICA occlusion , RICA no significant abnormality  . Arthritis   . Prediabetes   . CVA (cerebral infarction)     a. 2013 R Carona radiata stroke    Past Surgical History  Procedure Laterality Date  . Coronary artery bypass graft  2004  . Rotator cuff  repair    . Abdominal aortic aneurysm repair  2010    Aortic stent repair     Current Outpatient Prescriptions  Medication Sig Dispense Refill  . albuterol (PROVENTIL HFA;VENTOLIN HFA) 108 (90 Base) MCG/ACT inhaler Inhale 2 puffs into the lungs every 4 (four) hours as needed for wheezing or shortness of breath (cough). 1 Inhaler 0  . aspirin EC 81 MG tablet Take 81 mg by mouth every other day. After supper    . lisinopril (PRINIVIL,ZESTRIL) 40 MG tablet Take 1 tablet (40 mg total) by mouth daily. 30 tablet 3  . simvastatin (ZOCOR) 40 MG tablet TAKE ONE TABLET BY MOUTH  DAILY AFTER SUPPER 90 tablet 0  . warfarin (COUMADIN) 5 MG tablet TAKE ONE TABLET BY MOUTH AS DIRECTED 35 tablet 2   No current facility-administered medications for this visit.    Allergies:   Oxycodone-acetaminophen and Diltiazem hcl   Social History:  The patient  reports that he quit smoking about 5 years ago. His smoking use included Cigarettes. He smoked 0.00 packs per day for 35 years. He has never used smokeless tobacco. He reports that he does not drink alcohol or use illicit drugs.   Family History:  The patient's family history includes Diabetes in his mother; Heart disease in his brother; Stroke in his father; Thyroid  cancer in his sister.    ROS:  Please see the history of present illness.   Otherwise, review of systems is positive for none.   All other systems are reviewed and negative.    PHYSICAL EXAM: VS:  BP 170/100 mmHg  Pulse 61  Ht 5\' 7"  (1.702 m)  Wt 233 lb 3.2 oz (105.779 kg)  BMI 36.52 kg/m2 , BMI Body mass index is 36.52 kg/(m^2). GEN: Well nourished, well developed, in no acute distress HEENT: normal Neck: no JVD, carotid bruits, or masses Cardiac: RRR; no murmurs, rubs, or gallops,no edema  Respiratory:  clear to auscultation bilaterally, normal work of breathing GI: soft, nontender, nondistended, + BS MS: no deformity or atrophy Skin: warm and dry Neuro:  Strength and sensation are  intact Psych: euthymic mood, full affect  EKG:  EKG is ordered today. The ekg ordered today shows sinus rhythm, LAFB, nonspecific TW abnormality  Recent Labs: 06/14/2015: BUN 26*; Creatinine, Ser 1.46*; Hemoglobin 14.0; Platelets 142*; Potassium 3.8; Sodium 139    Lipid Panel     Component Value Date/Time   CHOL 139 06/01/2015 1028   CHOL 111 06/26/2014 0841   TRIG 366* 06/01/2015 1028   HDL 39* 06/01/2015 1028   HDL 39* 06/26/2014 0841   CHOLHDL 3.6 06/01/2015 1028   CHOLHDL 2.8 06/26/2014 0841   VLDL 40 06/26/2014 0841   LDLCALC 27 06/01/2015 1028   LDLCALC 32 06/26/2014 0841   LDLDIRECT 46 11/24/2011 0915     Wt Readings from Last 3 Encounters:  07/23/15 233 lb 3.2 oz (105.779 kg)  07/13/15 236 lb 12.8 oz (107.412 kg)  07/06/15 237 lb (107.502 kg)      Other studies Reviewed: Additional studies/ records that were reviewed today include: TTE 11/24/15  Review of the above records today demonstrates:  Left ventricle: The cavity size was normal. Wall thickness was increased in a pattern of moderate LVH. Systolic function was vigorous. The estimated ejection fraction was in the range of 65% to 70%. Wall motion was normal; there were no regional wall motion abnormalities. Left ventricular diastolic function parameters were normal.   ASSESSMENT AND PLAN:  1.  Paroxysmal atrial fibrillation: Anticoagulated with coumadin and followed in coumadin clinic.  He is in sinus rhythm today without any complaints. He is tolerating his Coumadin well.  This patients CHA2DS2-VASc Score and unadjusted Ischemic Stroke Rate (% per year) is equal to 7.2 % stroke rate/year from a score of 5  Above score calculated as 1 point each if present [CHF, HTN, DM, Vascular=MI/PAD/Aortic Plaque, Age if 65-74, or Male] Above score calculated as 2 points each if present [Age > 75, or Stroke/TIA/TE]   2. Bradycardia: at this time, unclear of cause of bradycardia.  HR in the 30s but with rebound  of HR into the 60s with sinus rhythm on the ECG in the PCP office.  I have discussed with him the options of watchful waiting to see if he has any symptoms from bradycardia versus further monitoring to see if we can find a cause. As it is unclear the cause of the bradycardia, Dylan Morgan place 48 hour monitor.      Current medicines are reviewed at length with the patient today.   The patient does not have concerns regarding his medicines.  The following changes were made today:  none  Labs/ tests ordered today include:  Orders Placed This Encounter  Procedures  . Holter monitor - 48 hour  . EKG 12-Lead  Disposition:   FU with Dylan Morgan 6 months  Signed, Massie Cogliano Jorja Loa, MD  07/23/2015 10:18 AM     [facility] [address] [place] [address] [phone] (office) [phone] (fax)

## 2015-07-23 NOTE — Patient Instructions (Addendum)
Medication Instructions:  Your physician recommends that you continue on your current medications as directed. Please refer to the Current Medication list given to you today.  Labwork: None ordered  Testing/Procedures: Your physician has recommended that you wear a 48 hour holter monitor. Holter monitors are medical devices that record the heart's electrical activity. Doctors most often use these monitors to diagnose arrhythmias. Arrhythmias are problems with the speed or rhythm of the heartbeat. The monitor is a small, portable device. You can wear one while you do your normal daily activities. This is usually used to diagnose what is causing palpitations/syncope (passing out).  Follow-Up: Your physician wants you to follow-up in: 3 months with a general cardiologist. You will receive a reminder letter in the mail two months in advance. If you don't receive a letter, please call our office to schedule the follow-up appointment.  Your physician wants you to follow-up in: 6 months with Dr. Curt Bears. You will receive a reminder letter in the mail two months in advance. If you don't receive a letter, please call our office to schedule the follow-up appointment.  If you need a refill on your cardiac medications before your next appointment, please call your pharmacy.  Thank you for choosing [facility]!!   Trinidad Curet, RN [phone]   Holter Monitoring A Holter monitor is a small device that is used to detect abnormal heart rhythms. It clips to your clothing and is connected by wires to flat, sticky disks (electrodes) that attach to your chest. It is worn continuously for 24-48 hours. HOME CARE INSTRUCTIONS  Wear your Holter monitor at all times, even while exercising and sleeping, for as long as directed by your health care provider.  Make sure that the Holter monitor is safely clipped to your clothing or close to your body as recommended by your health care provider.  Do not get  the monitor or wires wet.  Do not put body lotion or moisturizer on your chest.  Keep your skin clean.  Keep a diary of your daily activities, such as walking and doing chores. If you feel that your heartbeat is abnormal or that your heart is fluttering or skipping a beat:  Record what you are doing when it happens.  Record what time of day the symptoms occur.  Return your Holter monitor as directed by your health care provider.  Keep all follow-up visits as directed by your health care provider. This is important. SEEK IMMEDIATE MEDICAL CARE IF:  You feel lightheaded or you faint.  You have trouble breathing.  You feel pain in your chest, upper arm, or jaw.  You feel sick to your stomach and your skin is pale, cool, or damp.  You heartbeat feels unusual or abnormal.   This information is not intended to replace advice given to you by your health care provider. Make sure you discuss any questions you have with your health care provider.   Document Released: 12/17/2003 Document Revised: 04/10/2014 Document Reviewed: 10/27/2013 Elsevier Interactive Patient Education [employer].

## 2015-07-25 DIAGNOSIS — I480 Paroxysmal atrial fibrillation: Secondary | ICD-10-CM

## 2015-08-06 ENCOUNTER — Ambulatory Visit (INDEPENDENT_AMBULATORY_CARE_PROVIDER_SITE_OTHER): Payer: Commercial Managed Care - HMO | Admitting: *Deleted

## 2015-08-06 DIAGNOSIS — I480 Paroxysmal atrial fibrillation: Secondary | ICD-10-CM

## 2015-08-06 DIAGNOSIS — I4891 Unspecified atrial fibrillation: Secondary | ICD-10-CM

## 2015-08-06 DIAGNOSIS — I639 Cerebral infarction, unspecified: Secondary | ICD-10-CM | POA: Diagnosis not present

## 2015-08-06 DIAGNOSIS — Z7901 Long term (current) use of anticoagulants: Secondary | ICD-10-CM

## 2015-08-06 LAB — POCT INR: INR: 2.2

## 2015-08-12 NOTE — Addendum Note (Signed)
Addended by: Dorothyann Gibbs on: 08/12/2015 09:45 AM   Modules accepted: Orders

## 2015-08-24 ENCOUNTER — Other Ambulatory Visit: Payer: Self-pay | Admitting: Cardiology

## 2015-08-27 ENCOUNTER — Ambulatory Visit (INDEPENDENT_AMBULATORY_CARE_PROVIDER_SITE_OTHER): Payer: Commercial Managed Care - HMO | Admitting: *Deleted

## 2015-08-27 DIAGNOSIS — Z7901 Long term (current) use of anticoagulants: Secondary | ICD-10-CM

## 2015-08-27 DIAGNOSIS — I480 Paroxysmal atrial fibrillation: Secondary | ICD-10-CM

## 2015-08-27 DIAGNOSIS — I639 Cerebral infarction, unspecified: Secondary | ICD-10-CM

## 2015-08-27 DIAGNOSIS — I4891 Unspecified atrial fibrillation: Secondary | ICD-10-CM | POA: Diagnosis not present

## 2015-08-27 LAB — POCT INR: INR: 2.5

## 2015-09-04 ENCOUNTER — Emergency Department (HOSPITAL_COMMUNITY)
Admission: EM | Admit: 2015-09-04 | Discharge: 2015-09-04 | Disposition: A | Discharge status: Home / Self Care | Payer: Medicare HMO | Attending: Emergency Medicine | Admitting: Emergency Medicine

## 2015-09-04 ENCOUNTER — Emergency Department (HOSPITAL_COMMUNITY): Payer: Medicare HMO

## 2015-09-04 ENCOUNTER — Encounter (HOSPITAL_COMMUNITY): Payer: Self-pay | Admitting: Emergency Medicine

## 2015-09-04 DIAGNOSIS — I10 Essential (primary) hypertension: Secondary | ICD-10-CM | POA: Insufficient documentation

## 2015-09-04 DIAGNOSIS — Z87891 Personal history of nicotine dependence: Secondary | ICD-10-CM | POA: Diagnosis not present

## 2015-09-04 DIAGNOSIS — N189 Chronic kidney disease, unspecified: Secondary | ICD-10-CM | POA: Diagnosis not present

## 2015-09-04 DIAGNOSIS — Z7982 Long term (current) use of aspirin: Secondary | ICD-10-CM | POA: Insufficient documentation

## 2015-09-04 DIAGNOSIS — J111 Influenza due to unidentified influenza virus with other respiratory manifestations: Secondary | ICD-10-CM

## 2015-09-04 DIAGNOSIS — Z955 Presence of coronary angioplasty implant and graft: Secondary | ICD-10-CM | POA: Insufficient documentation

## 2015-09-04 DIAGNOSIS — I2510 Atherosclerotic heart disease of native coronary artery without angina pectoris: Secondary | ICD-10-CM | POA: Diagnosis not present

## 2015-09-04 DIAGNOSIS — Z79899 Other long term (current) drug therapy: Secondary | ICD-10-CM | POA: Diagnosis not present

## 2015-09-04 DIAGNOSIS — Z8673 Personal history of transient ischemic attack (TIA), and cerebral infarction without residual deficits: Secondary | ICD-10-CM | POA: Insufficient documentation

## 2015-09-04 DIAGNOSIS — Z7901 Long term (current) use of anticoagulants: Secondary | ICD-10-CM | POA: Diagnosis not present

## 2015-09-04 DIAGNOSIS — I129 Hypertensive chronic kidney disease with stage 1 through stage 4 chronic kidney disease, or unspecified chronic kidney disease: Secondary | ICD-10-CM | POA: Diagnosis not present

## 2015-09-04 DIAGNOSIS — R05 Cough: Secondary | ICD-10-CM | POA: Diagnosis not present

## 2015-09-04 DIAGNOSIS — R69 Illness, unspecified: Secondary | ICD-10-CM

## 2015-09-04 DIAGNOSIS — R0602 Shortness of breath: Secondary | ICD-10-CM | POA: Diagnosis not present

## 2015-09-04 LAB — BASIC METABOLIC PANEL
Anion gap: 5 (ref 5–15)
BUN: 21 mg/dL — ABNORMAL HIGH (ref 6–20)
CALCIUM: 8.8 mg/dL — AB (ref 8.9–10.3)
CO2: 28 mmol/L (ref 22–32)
CREATININE: 1.42 mg/dL — AB (ref 0.61–1.24)
Chloride: 108 mmol/L (ref 101–111)
GFR, EST AFRICAN AMERICAN: 55 mL/min — AB (ref >60)
GFR, EST NON AFRICAN AMERICAN: 48 mL/min — AB (ref >60)
GLUCOSE: 90 mg/dL (ref 65–99)
Potassium: 4 mmol/L (ref 3.5–5.1)
Sodium: 141 mmol/L (ref 135–145)

## 2015-09-04 LAB — CBC
HCT: 37.1 % — ABNORMAL LOW (ref 39.0–52.0)
Hemoglobin: 12.2 g/dL — ABNORMAL LOW (ref 13.0–17.0)
MCH: 29 pg (ref 26.0–34.0)
MCHC: 32.9 g/dL (ref 30.0–36.0)
MCV: 88.1 fL (ref 78.0–100.0)
PLATELETS: 183 10*3/uL (ref 150–400)
RBC: 4.21 MIL/uL — ABNORMAL LOW (ref 4.22–5.81)
RDW: 13.5 % (ref 11.5–15.5)
WBC: 5.3 10*3/uL (ref 4.0–10.5)

## 2015-09-04 NOTE — ED Provider Notes (Signed)
CSN: [id number]     Arrival date & time 09/04/15  1600 History   First MD Initiated Contact with Patient 09/04/15 1650     Chief Complaint  Patient presents with  . Generalized Body Aches  . Shortness of Breath     (Consider location/radiation/quality/duration/timing/severity/associated sxs/prior Treatment) HPI Complains of diffuse myalgias, cough productive of yellowish sputum and shortness of breath Onset 4 days ago. Denies lightheadedness denies other associated symptoms no nausea or vomiting. He treated himself with Tylenol. He feels improved today over yesterday. Nothing makes symptoms better or worse. No other associated symptoms Past Medical History  Diagnosis Date  . CAD (coronary artery disease) CABG in 2004    a. 2004 s/p CABG  . GERD (gastroesophageal reflux disease)   . Hypertension   . Hyperlipidemia   . Renal insufficiency   . Tobacco abuse   . Seborrheic dermatitis of scalp   . Bradycardia   . AAA (abdominal aortic aneurysm) ([place])     a. 2010 s/p stenting  . Paroxysmal atrial fibrillation ([place]) 02/22/2009    a. on Coumadin   . Carotid artery disease ([place])     a. 2012 dopplers with old LICA occlusion , RICA no significant abnormality  . Arthritis   . Prediabetes   . CVA (cerebral infarction)     a. 2013 R Carona radiata stroke    Past Surgical History  Procedure Laterality Date  . Coronary artery bypass graft  2004  . Rotator cuff repair    . Abdominal aortic aneurysm repair  2010    Aortic stent repair   Family History  Problem Relation Age of Onset  . Diabetes Mother   . Stroke Father   . Thyroid cancer Sister   . Heart disease Brother     Coronary artery disease   Social History  Substance Use Topics  . Smoking status: Former Smoker -- 0.00 packs/day for 35 years    Types: Cigarettes    Quit date: 02/03/2010  . Smokeless tobacco: Never Used  . Alcohol Use: No    Review of Systems  Constitutional: Negative.   HENT: Negative.   Respiratory:  Positive for cough and shortness of breath.   Cardiovascular: Negative.   Gastrointestinal: Negative.   Musculoskeletal: Positive for myalgias.  Skin: Negative.   Neurological: Negative.   Psychiatric/Behavioral: Negative.   All other systems reviewed and are negative.     Allergies  Oxycodone-acetaminophen and Diltiazem hcl  Home Medications   Prior to Admission medications   Medication Sig Start Date End Date Taking? Authorizing Provider  albuterol (PROVENTIL HFA;VENTOLIN HFA) 108 (90 Base) MCG/ACT inhaler Inhale 2 puffs into the lungs every 4 (four) hours as needed for wheezing or shortness of breath (cough). 06/14/15   Mercedes Camprubi-Soms, PA-C  aspirin EC 81 MG tablet Take 81 mg by mouth every other day. After supper    Historical Provider, MD  lisinopril (PRINIVIL,ZESTRIL) 40 MG tablet Take 1 tablet (40 mg total) by mouth daily. 06/01/15 05/31/16  Aldine Contes, MD  simvastatin (ZOCOR) 40 MG tablet TAKE ONE TABLET BY MOUTH  DAILY AFTER SUPPER 07/02/15   Liliane Shi, PA-C  warfarin (COUMADIN) 5 MG tablet Take as directed by coumadin clinic 08/25/15   Will Meredith Leeds, MD   BP 162/85 mmHg  Pulse 59  Temp(Src) 98 F (36.7 C) (Oral)  Resp 18  SpO2 98% Physical Exam  Constitutional: No distress.  Chronically ill-appearing  HENT:  Head: Normocephalic and atraumatic.  Eyes: Conjunctivae are  normal. Pupils are equal, round, and reactive to light.  Neck: Neck supple. No tracheal deviation present. No thyromegaly present.  Cardiovascular: Normal rate and regular rhythm.   No murmur heard. Pulmonary/Chest: Effort normal and breath sounds normal.  Abdominal: Soft. Bowel sounds are normal. He exhibits no distension. There is no tenderness.  Obese  Musculoskeletal: Normal range of motion. He exhibits edema. He exhibits no tenderness.  Trace pretibial pitting edema bilaterally  Neurological: He is alert. Coordination normal.  Skin: Skin is warm and dry. No rash noted.   Psychiatric: He has a normal mood and affect.  Nursing note and vitals reviewed.   ED Course  Procedures (including critical care time) Labs Review Labs Reviewed - No data to display  Imaging Review No results found. I have personally reviewed and evaluated these images and lab results as part of my medical decision-making.   EKG Interpretation None     7:10 PM patient feels well except complains of mild sore throat.  Chest x-ray viewed by me. Results for orders placed or performed during the hospital encounter of 57/01/77  Basic metabolic panel  Result Value Ref Range   Sodium 141 135 - 145 mmol/L   Potassium 4.0 3.5 - 5.1 mmol/L   Chloride 108 101 - 111 mmol/L   CO2 28 22 - 32 mmol/L   Glucose, Bld 90 65 - 99 mg/dL   BUN 21 (H) 6 - 20 mg/dL   Creatinine, Ser 1.42 (H) 0.61 - 1.24 mg/dL   Calcium 8.8 (L) 8.9 - 10.3 mg/dL   GFR calc non Af Amer 48 (L) >60 mL/min   GFR calc Af Amer 55 (L) >60 mL/min   Anion gap 5 5 - 15  CBC  Result Value Ref Range   WBC 5.3 4.0 - 10.5 K/uL   RBC 4.21 (L) 4.22 - 5.81 MIL/uL   Hemoglobin 12.2 (L) 13.0 - 17.0 g/dL   HCT 37.1 (L) 39.0 - 52.0 %   MCV 88.1 78.0 - 100.0 fL   MCH 29.0 26.0 - 34.0 pg   MCHC 32.9 30.0 - 36.0 g/dL   RDW 13.5 11.5 - 15.5 %   Platelets 183 150 - 400 K/uL   Dg Chest 2 View  09/04/2015  CLINICAL DATA:  Shortness of breath, dry cough, body aches for 4 days. EXAM: CHEST  2 VIEW COMPARISON:  06/14/2015 FINDINGS: Prior CABG. There is cardiomegaly. Tortuosity of the thoracic aorta. No confluent airspace opacities or effusions. No acute bony abnormality. IMPRESSION: Cardiomegaly.  Tortuous aorta.  No active disease. Electronically Signed   By: Rolm Baptise M.D.   On: 09/04/2015 17:20    MDM  Patient has influenza-like illness given his symptoms. Old records reviewed. Renal insufficiency is chronic. Final diagnoses:  None   Plan continue to take Tylenol for aches. Follow-up at [facility] outpatient clinic if not  continue to improve by next week. Blood pressure recheck 1 week Diagnosis #1 influenza-like illness #2 chronic renal insufficiency #3 elevated blood pressure    Orlie Dakin, MD 09/04/15 [phone]

## 2015-09-04 NOTE — Discharge Instructions (Signed)
Continue to take Tylenol as directed for aches. Call the [facility] outpatient clinic to arrange to get your blood pressure rechecked in one week. Today's was elevated at 177/88. See the outpatient clinic if you do not continue to improve in 1 week. Return if concerned for any reason.

## 2015-09-04 NOTE — ED Notes (Signed)
MD at bedside. 

## 2015-09-04 NOTE — ED Notes (Signed)
Pt c/o generalized body aches and shortness of breath since Tuesday. Reports he has been coughing up yellow phlegm.

## 2015-09-24 ENCOUNTER — Ambulatory Visit (INDEPENDENT_AMBULATORY_CARE_PROVIDER_SITE_OTHER): Payer: Self-pay

## 2015-09-24 DIAGNOSIS — I480 Paroxysmal atrial fibrillation: Secondary | ICD-10-CM

## 2015-09-24 DIAGNOSIS — I4891 Unspecified atrial fibrillation: Secondary | ICD-10-CM

## 2015-09-24 DIAGNOSIS — I639 Cerebral infarction, unspecified: Secondary | ICD-10-CM

## 2015-09-24 DIAGNOSIS — Z7901 Long term (current) use of anticoagulants: Secondary | ICD-10-CM

## 2015-09-24 LAB — POCT INR: INR: 2.9

## 2015-09-28 ENCOUNTER — Other Ambulatory Visit: Payer: Self-pay | Admitting: Physician Assistant

## 2015-09-30 ENCOUNTER — Encounter (HOSPITAL_COMMUNITY): Payer: Self-pay | Admitting: Emergency Medicine

## 2015-09-30 ENCOUNTER — Emergency Department (HOSPITAL_COMMUNITY): Payer: Medicare HMO

## 2015-09-30 ENCOUNTER — Emergency Department (HOSPITAL_COMMUNITY)
Admission: EM | Admit: 2015-09-30 | Discharge: 2015-09-30 | Disposition: A | Discharge status: Home / Self Care | Payer: Medicare HMO | Attending: Emergency Medicine | Admitting: Emergency Medicine

## 2015-09-30 DIAGNOSIS — Z951 Presence of aortocoronary bypass graft: Secondary | ICD-10-CM | POA: Diagnosis not present

## 2015-09-30 DIAGNOSIS — Z7901 Long term (current) use of anticoagulants: Secondary | ICD-10-CM | POA: Diagnosis not present

## 2015-09-30 DIAGNOSIS — I10 Essential (primary) hypertension: Secondary | ICD-10-CM | POA: Insufficient documentation

## 2015-09-30 DIAGNOSIS — Z8673 Personal history of transient ischemic attack (TIA), and cerebral infarction without residual deficits: Secondary | ICD-10-CM | POA: Insufficient documentation

## 2015-09-30 DIAGNOSIS — R079 Chest pain, unspecified: Secondary | ICD-10-CM | POA: Insufficient documentation

## 2015-09-30 DIAGNOSIS — Z87891 Personal history of nicotine dependence: Secondary | ICD-10-CM | POA: Diagnosis not present

## 2015-09-30 DIAGNOSIS — Z7982 Long term (current) use of aspirin: Secondary | ICD-10-CM | POA: Diagnosis not present

## 2015-09-30 DIAGNOSIS — H6092 Unspecified otitis externa, left ear: Secondary | ICD-10-CM | POA: Insufficient documentation

## 2015-09-30 DIAGNOSIS — Z79899 Other long term (current) drug therapy: Secondary | ICD-10-CM | POA: Insufficient documentation

## 2015-09-30 DIAGNOSIS — H9202 Otalgia, left ear: Secondary | ICD-10-CM | POA: Diagnosis present

## 2015-09-30 DIAGNOSIS — I2510 Atherosclerotic heart disease of native coronary artery without angina pectoris: Secondary | ICD-10-CM | POA: Insufficient documentation

## 2015-09-30 LAB — CBC
HCT: 45.2 % (ref 39.0–52.0)
Hemoglobin: 15 g/dL (ref 13.0–17.0)
MCH: 29.2 pg (ref 26.0–34.0)
MCHC: 33.2 g/dL (ref 30.0–36.0)
MCV: 87.9 fL (ref 78.0–100.0)
PLATELETS: 142 10*3/uL — AB (ref 150–400)
RBC: 5.14 MIL/uL (ref 4.22–5.81)
RDW: 13.8 % (ref 11.5–15.5)
WBC: 7.1 10*3/uL (ref 4.0–10.5)

## 2015-09-30 LAB — BASIC METABOLIC PANEL
Anion gap: 9 (ref 5–15)
BUN: 15 mg/dL (ref 6–20)
CO2: 24 mmol/L (ref 22–32)
CREATININE: 1.42 mg/dL — AB (ref 0.61–1.24)
Calcium: 9.5 mg/dL (ref 8.9–10.3)
Chloride: 102 mmol/L (ref 101–111)
GFR calc Af Amer: 55 mL/min — ABNORMAL LOW (ref >60)
GFR calc non Af Amer: 48 mL/min — ABNORMAL LOW (ref >60)
Glucose, Bld: 105 mg/dL — ABNORMAL HIGH (ref 65–99)
Potassium: 4 mmol/L (ref 3.5–5.1)
Sodium: 135 mmol/L (ref 135–145)

## 2015-09-30 LAB — SEDIMENTATION RATE: SED RATE: 40 mm/h — AB (ref 0–16)

## 2015-09-30 LAB — C-REACTIVE PROTEIN: CRP: 7.8 mg/dL — ABNORMAL HIGH (ref <1.0)

## 2015-09-30 LAB — I-STAT TROPONIN, ED: TROPONIN I, POC: 0.01 ng/mL (ref 0.00–0.08)

## 2015-09-30 LAB — PROTIME-INR
INR: 2 — AB (ref 0.00–1.49)
Prothrombin Time: 22.6 seconds — ABNORMAL HIGH (ref 11.6–15.2)

## 2015-09-30 MED ORDER — CIPROFLOXACIN HCL 500 MG PO TABS: 500.0000 mg | ORAL_TABLET | Freq: Two times a day (BID) | ORAL | Status: DC

## 2015-09-30 MED ORDER — IOPAMIDOL (ISOVUE-300) INJECTION 61%
INTRAVENOUS | Status: AC
  Administered 2015-09-30: 75 mL
  Filled 2015-09-30: qty 75

## 2015-09-30 MED ORDER — CIPROFLOXACIN-HYDROCORTISONE 0.2-1 % OT SUSP: 3.0000 [drp] | Freq: Two times a day (BID) | OTIC | Status: DC

## 2015-09-30 NOTE — ED Notes (Signed)
Patient alert and oriented at discharge.  Patient verbalized understanding of instructions and the need for follow up care.  Patient assisted to the waiting room via wheelchair.

## 2015-09-30 NOTE — ED Notes (Signed)
Patient transported to CT 

## 2015-09-30 NOTE — ED Notes (Signed)
Pt sts body aches and some pain in chest; pt denies SOB but sts feels poorly

## 2015-09-30 NOTE — ED Provider Notes (Signed)
CSN: [id number]     Arrival date & time 09/30/15  1105 History   First MD Initiated Contact with Patient 09/30/15 1300     Chief Complaint  Patient presents with  . Chest Pain  . Generalized Body Aches     (Consider location/radiation/quality/duration/timing/severity/associated sxs/prior Treatment) HPI  Pt presenting with c/o nasal congestion and left ear pain with generalized body aches.  He states he has had "a cold" for the past several weeks. Last night his left ear began to hurt.  Today he noted pain behind the left ear and redness and swelling of ear.  No fever.  No shortness of breath.  Pt states he did have some chest pain today but this is not different from his occasional chest pain that he gets.  No diaphoresis, nausea, radiation of pain.  There are no other associated systemic symptoms, there are no other alleviating or modifying factors.   Past Medical History  Diagnosis Date  . CAD (coronary artery disease) CABG in 2004    a. 2004 s/p CABG  . GERD (gastroesophageal reflux disease)   . Hypertension   . Hyperlipidemia   . Renal insufficiency   . Tobacco abuse   . Seborrheic dermatitis of scalp   . Bradycardia   . AAA (abdominal aortic aneurysm) ([place])     a. 2010 s/p stenting  . Paroxysmal atrial fibrillation ([place]) 02/22/2009    a. on Coumadin   . Carotid artery disease ([place])     a. 2012 dopplers with old LICA occlusion , RICA no significant abnormality  . Arthritis   . Prediabetes   . CVA (cerebral infarction)     a. 2013 R Carona radiata stroke    Past Surgical History  Procedure Laterality Date  . Coronary artery bypass graft  2004  . Rotator cuff repair    . Abdominal aortic aneurysm repair  2010    Aortic stent repair   Family History  Problem Relation Age of Onset  . Diabetes Mother   . Stroke Father   . Thyroid cancer Sister   . Heart disease Brother     Coronary artery disease   Social History  Substance Use Topics  . Smoking status: Former  Smoker -- 0.00 packs/day for 35 years    Types: Cigarettes    Quit date: 02/03/2010  . Smokeless tobacco: Never Used  . Alcohol Use: No    Review of Systems  ROS reviewed and all otherwise negative except for mentioned in HPI    Allergies  Oxycodone-acetaminophen and Diltiazem hcl  Home Medications   Prior to Admission medications   Medication Sig Start Date End Date Taking? Authorizing Provider  albuterol (PROVENTIL HFA;VENTOLIN HFA) 108 (90 Base) MCG/ACT inhaler Inhale 2 puffs into the lungs every 4 (four) hours as needed for wheezing or shortness of breath (cough). 06/14/15  Yes Mercedes Camprubi-Soms, PA-C  aspirin EC 81 MG tablet Take 81 mg by mouth every other day. After supper   Yes Historical Provider, MD  ibuprofen (ADVIL,MOTRIN) 200 MG tablet Take 200 mg by mouth every 6 (six) hours as needed for moderate pain.   Yes Historical Provider, MD  lisinopril (PRINIVIL,ZESTRIL) 40 MG tablet Take 1 tablet (40 mg total) by mouth daily. 06/01/15 05/31/16 Yes Aldine Contes, MD  Multiple Vitamin (MULTIVITAMIN) tablet Take 1 tablet by mouth daily.   Yes Historical Provider, MD  simvastatin (ZOCOR) 40 MG tablet TAKE ONE TABLET BY MOUTH ONCE DAILY AFTER  SUPPER 09/28/15  Yes Scott T  Kathlen Mody, PA-C  warfarin (COUMADIN) 5 MG tablet Take as directed by coumadin clinic Patient taking differently: Take 5 mg by mouth daily at 6 PM.  08/25/15  Yes Will Meredith Leeds, MD  ciprofloxacin (CIPRO) 500 MG tablet Take 1 tablet (500 mg total) by mouth 2 (two) times daily. 09/30/15   Alfonzo Beers, MD  ciprofloxacin-hydrocortisone (CIPRO [facility]) otic suspension Place 3 drops into the left ear 2 (two) times daily. 09/30/15   Alfonzo Beers, MD   BP 138/82 mmHg  Pulse 96  Temp(Src) 99 F (37.2 C) (Oral)  Resp 17  SpO2 96%  Vitals reviewed Physical Exam  Physical Examination: General appearance - alert, well appearing, and in no distress Mental status - alert, oriented to person, place, and time Eyes - no  conjunctival injection, no scleral icterus Ears - right TM and EAC normal, left pinna with erythema and swelling, swelling of EAC, ttp over left mastoid Mouth - mucous membranes moist, pharynx normal without lesions Neck - supple, no significant adenopathy Chest - clear to auscultation, no wheezes, rales or rhonchi, symmetric air entry Heart - normal rate, regular rhythm, normal S1, S2, no murmurs, rubs, clicks or gallops Neurological - alert, oriented, normal speech Extremities - peripheral pulses normal, no pedal edema, no clubbing or cyanosis Skin - normal coloration and turgor, no rashes  ED Course  Procedures (including critical care time) Labs Review Labs Reviewed  BASIC METABOLIC PANEL - Abnormal; Notable for the following:    Glucose, Bld 105 (*)    Creatinine, Ser 1.42 (*)    GFR calc non Af Amer 48 (*)    GFR calc Af Amer 55 (*)    All other components within normal limits  CBC - Abnormal; Notable for the following:    Platelets 142 (*)    All other components within normal limits  PROTIME-INR - Abnormal; Notable for the following:    Prothrombin Time 22.6 (*)    INR 2.00 (*)    All other components within normal limits  SEDIMENTATION RATE - Abnormal; Notable for the following:    Sed Rate 40 (*)    All other components within normal limits  C-REACTIVE PROTEIN - Abnormal; Notable for the following:    CRP 7.8 (*)    All other components within normal limits  I-STAT TROPOININ, ED    Imaging Review Dg Chest 2 View  09/30/2015  CLINICAL DATA:  Chest pain EXAM: CHEST  2 VIEW COMPARISON:  09/04/2015 FINDINGS: Cardiac shadow is mildly enlarged. Postsurgical changes are again seen. Minimal right basilar atelectasis is noted. No compression deformities are seen. Aortic stent graft is noted. IMPRESSION: Minimal right basilar atelectasis. Electronically Signed   By: Inez Catalina M.D.   On: 09/30/2015 12:03   Ct Temporal Bones W/cm  09/30/2015  CLINICAL DATA:  Left ear pain  with swelling and tenderness. Symptoms for 1 week. Title C8 EXAM: CT TEMPORAL BONES WITH CONTRAST TECHNIQUE: Axial and coronal plane CT imaging of the petrous temporal bones was performed with thin-collimation image reconstruction after intravenous contrast administration. Multiplanar CT image reconstructions were also generated. CONTRAST:  60m ISOVUE-300 IOPAMIDOL (ISOVUE-300) INJECTION 61% COMPARISON:  Neck CT 03/13/2012 FINDINGS: Right temporal bone: The pina and external auditory canal are unremarkable. The tympanic membrane is thin. The ossicles are normally formed and aligned. Mastoid and middle ear is well pneumatized and aerated. The vestibular structures appear normally formed and bony covered. Internal auditory canal is normal in size. The vestibular aqueduct is normal in size.  The carotid and sigmoid sinus are bone covered. Unremarkable facial nerve canal. Left temporal bone: When compared to the right, especially on coronal reformats, there is mild thickening of the cartilaginous external auditory canal, correlating with history and compatible with otitis externa. Symmetric appearance of the bony external auditory canal with no signs of malignant erosion. There is no abscess or dural venous sinus thrombosis. Aside from cerumen, the left external auditory canal is patent. The tympanic membrane is thin. The ossicles are normally formed and aligned. Mastoid and middle ear is well pneumatized and aerated. The vestibular structures appear normally formed and bony covered. Internal auditory canal is normal in size. The vestibular aqueduct is normal in size. The carotid and sigmoid sinus are bone covered. Unremarkable facial nerve canal. Left ICA occlusion with supraclinoid ICA reconstitution likely from the anterior communicating artery. This is presumably chronic in this clinical setting. Remote but interval right cerebellar small vessel infarct. Bilateral cataract resection and scleral banding on the left.  Chronic left maxillary sinusitis. IMPRESSION: 1. Otitis externa on the left without complicating feature. No otomastoiditis. 2. Left ICA occlusion with supraclinoid ICA reconstitution. 3. Remote small vessel infarct in the right cerebellum that has developed since 2013. Electronically Signed   By: Monte Fantasia M.D.   On: 09/30/2015 16:31   I have personally reviewed and evaluated these images and lab results as part of my medical decision-making.   EKG Interpretation   Date/Time:  Thursday September 30 2015 11:07:47 EDT Ventricular Rate:  85 PR Interval:  140 QRS Duration: 96 QT Interval:  372 QTC Calculation: 442 R Axis:   -65 Text Interpretation:  Normal sinus rhythm with sinus arrhythmia Incomplete  right bundle branch block Left anterior fascicular block Possible Lateral  infarct , age undetermined Abnormal ECG No significant change since last  tracing Confirmed by [facility]  MD, MARTHA [phone]) on 09/30/2015 1:07:02 PM      MDM   Final diagnoses:  Otitis externa, left    Pt presenting with c/o ongoing viral symptoms- congestion/fatigue- now with developing left ear pain- has tenderness to palpation over mastoid process- CT temporal bone finds no mastoiditis.  Pt is not diabetic making otitis externa unlikely as well.  Will treat with cipro otic and cipro po for otitis externa.  Pt did mention chest pain but states this is not different from his intermittent episodes of chest pain that he has at home.  EKG and labs reassuring.  Pt advised to have his INR checked by his PMD due to interaction of coumadin and cipro.  Pt verbalized understanding of the importance of this followup.  Discharged with strict return precautions.  Pt agreeable with plan.    Alfonzo Beers, MD 10/01/15 [phone]

## 2015-09-30 NOTE — Discharge Instructions (Signed)
Return to the ED with any concerns including fever/chills, vomiting and not able to keep down liquids, difficulty breathing, fainting, decreased level of alertness/lethargy, or any other alarming symptoms  You should have your INR checked while on your antibiotics- your coumadin dose may need to be decreased- you should arrange for a followup appointment with your doctor in the next 2-3 days

## 2015-10-06 ENCOUNTER — Ambulatory Visit (INDEPENDENT_AMBULATORY_CARE_PROVIDER_SITE_OTHER): Payer: Medicare HMO | Admitting: *Deleted

## 2015-10-06 DIAGNOSIS — I4891 Unspecified atrial fibrillation: Secondary | ICD-10-CM | POA: Diagnosis not present

## 2015-10-06 DIAGNOSIS — Z7901 Long term (current) use of anticoagulants: Secondary | ICD-10-CM

## 2015-10-06 DIAGNOSIS — I639 Cerebral infarction, unspecified: Secondary | ICD-10-CM

## 2015-10-06 DIAGNOSIS — I480 Paroxysmal atrial fibrillation: Secondary | ICD-10-CM | POA: Diagnosis not present

## 2015-10-06 LAB — POCT INR: INR: 2.1

## 2015-10-26 ENCOUNTER — Encounter: Payer: Self-pay | Admitting: Interventional Cardiology

## 2015-10-26 ENCOUNTER — Ambulatory Visit (INDEPENDENT_AMBULATORY_CARE_PROVIDER_SITE_OTHER): Payer: Medicare HMO | Admitting: Interventional Cardiology

## 2015-10-26 VITALS — BP 130/90 | HR 72 | Ht 67.5 in | Wt 232.0 lb

## 2015-10-26 DIAGNOSIS — I480 Paroxysmal atrial fibrillation: Secondary | ICD-10-CM

## 2015-10-26 DIAGNOSIS — E785 Hyperlipidemia, unspecified: Secondary | ICD-10-CM

## 2015-10-26 DIAGNOSIS — E669 Obesity, unspecified: Secondary | ICD-10-CM

## 2015-10-26 DIAGNOSIS — I2510 Atherosclerotic heart disease of native coronary artery without angina pectoris: Secondary | ICD-10-CM

## 2015-10-26 NOTE — Progress Notes (Signed)
Cardiology Office Note   Date:  10/26/2015   ID:  Dylan Morgan, DOB 04-20-1945, MRN [id number]  PCP:  Maximino Greenland, MD    No chief complaint on file.  CAD, AFib  Wt Readings from Last 3 Encounters:  10/26/15 232 lb (105.2 kg)  07/23/15 233 lb 3.2 oz (105.8 kg)  07/13/15 236 lb 12.8 oz (107.4 kg)       History of Present Illness: Dylan Morgan is a 70 y.o. male  with a history of coronary artery disease. Bypass surgery in 2004. He does not remember distinct chest pain at that time, but he had fatigue and dizziness. No coronary PCI since that time. He also has peripheral arterial disease including a prior abdominal aortic aneurysm status post stenting in 2010. He has a known left internal carotid artery occlusion. His right internal carotid artery is patent. He has had a prior stroke with a history of paroxysmal atrial fibrillation. He is maintained on Coumadin for anticoagulation.  Prior back injury that has left him with chronic pain.  He walks at least 30 minutes a day.  He denies any chest pain or SHOB.  He stays active.  He does try to eat healthy.  He drinks a lot of lipton sweet tea.  He drinks a lot of water.    Past Medical History:  Diagnosis Date  . AAA (abdominal aortic aneurysm) ([place])    a. 2010 s/p stenting  . Arthritis   . Bradycardia   . CAD (coronary artery disease) CABG in 2004   a. 2004 s/p CABG  . Carotid artery disease ([place])    a. 2012 dopplers with old LICA occlusion , RICA no significant abnormality  . CVA (cerebral infarction)    a. 2013 R Carona radiata stroke   . GERD (gastroesophageal reflux disease)   . Hyperlipidemia   . Hypertension   . Paroxysmal atrial fibrillation ([place]) 02/22/2009   a. on Coumadin   . Prediabetes   . Renal insufficiency   . Seborrheic dermatitis of scalp   . Tobacco abuse     Past Surgical History:  Procedure Laterality Date  . ABDOMINAL AORTIC ANEURYSM REPAIR  2010   Aortic stent repair  . CORONARY  ARTERY BYPASS GRAFT  2004  . ROTATOR CUFF REPAIR       Current Outpatient Prescriptions  Medication Sig Dispense Refill  . albuterol (PROVENTIL HFA;VENTOLIN HFA) 108 (90 Base) MCG/ACT inhaler Inhale 2 puffs into the lungs every 4 (four) hours as needed for wheezing or shortness of breath (cough). 1 Inhaler 0  . aspirin EC 81 MG tablet Take 81 mg by mouth every other day. After supper    . ibuprofen (ADVIL,MOTRIN) 200 MG tablet Take 200 mg by mouth every 6 (six) hours as needed for moderate pain.    . Multiple Vitamin (MULTIVITAMIN) tablet Take 1 tablet by mouth daily.    . simvastatin (ZOCOR) 40 MG tablet TAKE ONE TABLET BY MOUTH ONCE DAILY AFTER  SUPPER 90 tablet 2  . warfarin (COUMADIN) 5 MG tablet Take 5 mg by mouth as directed.     No current facility-administered medications for this visit.     Allergies:   Oxycodone-acetaminophen and Diltiazem hcl    Social History:  The patient  reports that he quit smoking about 5 years ago. His smoking use included Cigarettes. He smoked 0.00 packs per day for 35.00 years. He has never used smokeless tobacco. He reports that he does not drink alcohol  or use drugs.   Family History:  The patient's family history includes Diabetes in his mother; Heart attack in his brother; Heart disease in his brother; Stroke in his father; Thyroid cancer in his sister.    ROS:  Please see the history of present illness.   Otherwise, review of systems are positive for knee and back pain.   All other systems are reviewed and negative.    PHYSICAL EXAM: VS:  BP 130/90   Pulse 72   Ht 5' 7.5" (1.715 m)   Wt 232 lb (105.2 kg)   BMI 35.80 kg/m  , BMI Body mass index is 35.8 kg/m. GEN: Well nourished, well developed, in no acute distress  HEENT: normal  Neck: no JVD, carotid bruits, or masses Cardiac: RRR; no murmurs, rubs, or gallops,no edema  Respiratory:  clear to auscultation bilaterally, normal work of breathing GI: soft, nontender, nondistended, +  BS MS: no deformity or atrophy  Skin: warm and dry, no rash Neuro:  Strength and sensation are intact Psych: euthymic mood, full affect   EKG:   The ekg ordered on 6/30 demonstrates NSR, NSST   Recent Labs: 09/30/2015: BUN 15; Creatinine, Ser 1.42; Hemoglobin 15.0; Platelets 142; Potassium 4.0; Sodium 135   Lipid Panel    Component Value Date/Time   CHOL 139 06/01/2015 1028   TRIG 366 (H) 06/01/2015 1028   HDL 39 (L) 06/01/2015 1028   CHOLHDL 3.6 06/01/2015 1028   CHOLHDL 2.8 06/26/2014 0841   VLDL 40 06/26/2014 0841   LDLCALC 27 06/01/2015 1028   LDLDIRECT 46 11/24/2011 0915     Other studies Reviewed: Additional studies/ records that were reviewed today with results demonstrating: low risk stress test in 2015 .   ASSESSMENT AND PLAN:  1. CAD: No angina.  Continue aggresssive secondary prevention.  Low risk stress test in 2015. No symptoms. 2. Hyperlipidemia: TG elevated.We spoke about reducing the amount of sugar she is getting in his diet. Will recheck lipids in 4 weeks. If triglycerides continue to be elevated, will refer to lipid clinic.   3. Obesity:  Try to improve diet to help lose weight. 4. Paroxysmal atrial fibrillation with history of stroke. Continue Coumadin for stroke prevention. No bleeding problems at this time.    Current medicines are reviewed at length with the patient today.  The patient concerns regarding his medicines were addressed.  The following changes have been made:  No change  Labs/ tests ordered today include:  No orders of the defined types were placed in this encounter.   Recommend 150 minutes/week of aerobic exercise Low fat, low carb, high fiber diet recommended  Disposition:   FU in 1 year   Signed, Larae Grooms, MD  10/26/2015 9:42 AM    [facility] [address], [place], [place]  [address] Phone: [phone]; Fax: [phone]

## 2015-10-26 NOTE — Patient Instructions (Addendum)
Medication Instructions:  Same-no changes  Labwork: Lipids in 1 month. Please do not eat or drink after midnight the night before labs are drawn. Please decrease your sugar intake.  Testing/Procedures: None  Follow-Up: 1 year.     If you need a refill on your cardiac medications before your next appointment, please call your pharmacy.

## 2015-10-29 ENCOUNTER — Ambulatory Visit (INDEPENDENT_AMBULATORY_CARE_PROVIDER_SITE_OTHER): Payer: Medicare HMO | Admitting: Internal Medicine

## 2015-10-29 ENCOUNTER — Encounter (INDEPENDENT_AMBULATORY_CARE_PROVIDER_SITE_OTHER): Payer: Self-pay

## 2015-10-29 ENCOUNTER — Encounter: Payer: Self-pay | Admitting: Internal Medicine

## 2015-10-29 DIAGNOSIS — G629 Polyneuropathy, unspecified: Secondary | ICD-10-CM | POA: Diagnosis not present

## 2015-10-29 DIAGNOSIS — G6289 Other specified polyneuropathies: Secondary | ICD-10-CM

## 2015-10-29 MED ORDER — GABAPENTIN 300 MG PO CAPS: 300.0000 mg | ORAL_CAPSULE | Freq: Every day | ORAL | 2 refills | Status: DC

## 2015-10-29 NOTE — Patient Instructions (Signed)
Thank you for coming to see me today. It was a pleasure. Today we talked about:   Burning in your feet: I have refilled your Gabapentin.  Please continue to walk and get plenty of exercise.  I have also given you a picture of daily stretching exercises for your leg cramps.  Please follow-up with [place] in 1-2 weeks if not better.  Otherwise follow up as planned with Dr. Dareen Piano.  If you have any questions or concerns, please do not hesitate to call the office at [phone].  Take Care,   Jule Ser, DO

## 2015-10-29 NOTE — Progress Notes (Signed)
Internal Medicine Clinic Attending  Case discussed with Dr. Wallace at the time of the visit.  We reviewed the resident's history and exam and pertinent patient test results.  I agree with the assessment, diagnosis, and plan of care documented in the resident's note.  

## 2015-10-29 NOTE — Assessment & Plan Note (Signed)
A: Pain consistent with past neuropathy.  There's been no clear etiology of his neuropathy per PCP notes.  He states symptoms coincide with weather change and running out of his gabapentin.  P: - resume gabapentin '300mg'$  QHS. - recommended daily stretching exercises as initial treatment of nocturnal cramping.  If ineffective, could consider B-complex vitamins. - RTC 1-2 weeks if not better, otherwise follow up with PCP.

## 2015-10-29 NOTE — Progress Notes (Signed)
   CC: burning in feet  HPI:  [name] is a 70 y.o. man with PMHx as detailed below presenting today for burning in his feet.  He reports a few days ago with the weather change, he began having burning, tingling and pins and needle sensation in his feet.  This coincided he says with running out of his gabapentin.  States it "feels like a fire is in them" and is similar to his past neuropathy pain.  He denies any other symptoms other than some nocturnal leg cramping in his calves.  No exertional pain.  Attempts relief with moving his feet around which helps a little.  Please see A&P for details of today's visit.  Past Medical History:  Diagnosis Date  . AAA (abdominal aortic aneurysm) ([place])    a. 2010 s/p stenting  . Arthritis   . Bradycardia   . CAD (coronary artery disease) CABG in 2004   a. 2004 s/p CABG  . Carotid artery disease ([place])    a. 2012 dopplers with old LICA occlusion , RICA no significant abnormality  . CVA (cerebral infarction)    a. 2013 R Carona radiata stroke   . GERD (gastroesophageal reflux disease)   . Hyperlipidemia   . Hypertension   . Paroxysmal atrial fibrillation ([place]) 02/22/2009   a. on Coumadin   . Prediabetes   . Renal insufficiency   . Seborrheic dermatitis of scalp   . Tobacco abuse     Review of Systems:   Review of Systems  Constitutional: Negative for chills and fever.  Respiratory: Negative for shortness of breath.   Cardiovascular: Negative for chest pain.  Neurological: Positive for tingling.     Physical Exam:  Vitals:   10/29/15 1002  BP: (!) 144/93  Pulse: 60  Temp: 97.7 F (36.5 C)  TempSrc: Oral  SpO2: 98%  Weight: 238 lb (108 kg)  Height: 5' 7.5" (1.715 m)   Physical Exam  Constitutional: He is oriented to person, place, and time.  Pleasant, obese man, no distress.  HENT:  Head: Normocephalic and atraumatic.  Eyes: EOM are normal.  Cardiovascular: Normal rate and regular rhythm.   Pulmonary/Chest: Effort  normal.  Neurological: He is alert and oriented to person, place, and time.  Skin: Skin is warm and dry. No rash noted.    Assessment & Plan:   See Encounters Tab for problem based charting.   Patient discussed with Dr. Lynnae January   Peripheral neuropathy A: Pain consistent with past neuropathy.  There's been no clear etiology of his neuropathy per PCP notes.  He states symptoms coincide with weather change and running out of his gabapentin.  P: - resume gabapentin '300mg'$  QHS. - recommended daily stretching exercises as initial treatment of nocturnal cramping.  If ineffective, could consider B-complex vitamins. - RTC 1-2 weeks if not better, otherwise follow up with PCP.

## 2015-11-11 NOTE — Addendum Note (Signed)
Addended by: Freada Bergeron on: 11/11/2015 09:38 AM   Modules accepted: Orders

## 2015-11-12 DIAGNOSIS — M545 Low back pain: Secondary | ICD-10-CM | POA: Diagnosis not present

## 2015-11-19 ENCOUNTER — Ambulatory Visit (INDEPENDENT_AMBULATORY_CARE_PROVIDER_SITE_OTHER): Payer: Medicare HMO | Admitting: Pharmacist

## 2015-11-19 DIAGNOSIS — Z7901 Long term (current) use of anticoagulants: Secondary | ICD-10-CM

## 2015-11-19 DIAGNOSIS — I639 Cerebral infarction, unspecified: Secondary | ICD-10-CM | POA: Diagnosis not present

## 2015-11-19 DIAGNOSIS — I4891 Unspecified atrial fibrillation: Secondary | ICD-10-CM | POA: Diagnosis not present

## 2015-11-19 DIAGNOSIS — I480 Paroxysmal atrial fibrillation: Secondary | ICD-10-CM

## 2015-11-19 LAB — POCT INR: INR: 2.4

## 2015-11-26 ENCOUNTER — Other Ambulatory Visit: Payer: Medicare HMO | Admitting: *Deleted

## 2015-11-26 DIAGNOSIS — E785 Hyperlipidemia, unspecified: Secondary | ICD-10-CM

## 2015-11-26 LAB — LIPID PANEL
CHOL/HDL RATIO: 3 ratio (ref <=5.0)
CHOLESTEROL: 124 mg/dL — AB (ref 125–200)
HDL: 41 mg/dL (ref >=40)
LDL CALC: 20 mg/dL (ref <130)
TRIGLYCERIDES: 314 mg/dL — AB (ref <150)
VLDL: 63 mg/dL — AB (ref <30)

## 2015-12-03 ENCOUNTER — Telehealth: Payer: Self-pay | Admitting: Internal Medicine

## 2015-12-03 NOTE — Telephone Encounter (Signed)
APT. REMINDER CALL, LMTCB °

## 2015-12-07 ENCOUNTER — Ambulatory Visit (INDEPENDENT_AMBULATORY_CARE_PROVIDER_SITE_OTHER): Payer: Medicare HMO | Admitting: Internal Medicine

## 2015-12-07 ENCOUNTER — Encounter: Payer: Self-pay | Admitting: Internal Medicine

## 2015-12-07 VITALS — BP 131/64 | HR 38 | Temp 97.6°F | Ht 67.5 in | Wt 234.3 lb

## 2015-12-07 DIAGNOSIS — G629 Polyneuropathy, unspecified: Secondary | ICD-10-CM | POA: Diagnosis not present

## 2015-12-07 DIAGNOSIS — G609 Hereditary and idiopathic neuropathy, unspecified: Secondary | ICD-10-CM

## 2015-12-07 DIAGNOSIS — I129 Hypertensive chronic kidney disease with stage 1 through stage 4 chronic kidney disease, or unspecified chronic kidney disease: Secondary | ICD-10-CM | POA: Diagnosis not present

## 2015-12-07 DIAGNOSIS — N183 Chronic kidney disease, stage 3 unspecified: Secondary | ICD-10-CM

## 2015-12-07 DIAGNOSIS — R05 Cough: Secondary | ICD-10-CM | POA: Diagnosis not present

## 2015-12-07 DIAGNOSIS — R053 Chronic cough: Secondary | ICD-10-CM

## 2015-12-07 DIAGNOSIS — Z0000 Encounter for general adult medical examination without abnormal findings: Secondary | ICD-10-CM

## 2015-12-07 DIAGNOSIS — Z23 Encounter for immunization: Secondary | ICD-10-CM

## 2015-12-07 DIAGNOSIS — Z87891 Personal history of nicotine dependence: Secondary | ICD-10-CM

## 2015-12-07 DIAGNOSIS — I10 Essential (primary) hypertension: Secondary | ICD-10-CM

## 2015-12-07 NOTE — Patient Instructions (Signed)
- It was a pleasure seeing you today - We will give you a flu shot today - Your BP is well controlled - Will check your bloodwork on your next visit - I will order a scan of your lungs and have you follow up in 1 week -  I will refer you to a neurologist for your neuropathy    Upper Respiratory Infection, Adult Most upper respiratory infections (URIs) are a viral infection of the air passages leading to the lungs. A URI affects the nose, throat, and upper air passages. The most common type of URI is nasopharyngitis and is typically referred to as "the common cold." URIs run their course and usually go away on their own. Most of the time, a URI does not require medical attention, but sometimes a bacterial infection in the upper airways can follow a viral infection. This is called a secondary infection. Sinus and middle ear infections are common types of secondary upper respiratory infections. Bacterial pneumonia can also complicate a URI. A URI can worsen asthma and chronic obstructive pulmonary disease (COPD). Sometimes, these complications can require emergency medical care and may be life threatening.  CAUSES Almost all URIs are caused by viruses. A virus is a type of germ and can spread from one person to another.  RISKS FACTORS You may be at risk for a URI if:   You smoke.   You have chronic heart or lung disease.  You have a weakened defense (immune) system.   You are very young or very old.   You have nasal allergies or asthma.  You work in crowded or poorly ventilated areas.  You work in health care facilities or schools. SIGNS AND SYMPTOMS  Symptoms typically develop 2-3 days after you come in contact with a cold virus. Most viral URIs last 7-10 days. However, viral URIs from the influenza virus (flu virus) can last 14-18 days and are typically more severe. Symptoms may include:   Runny or stuffy (congested) nose.   Sneezing.   Cough.   Sore throat.   Headache.    Fatigue.   Fever.   Loss of appetite.   Pain in your forehead, behind your eyes, and over your cheekbones (sinus pain).  Muscle aches.  DIAGNOSIS  Your health care provider may diagnose a URI by:  Physical exam.  Tests to check that your symptoms are not due to another condition such as:  Strep throat.  Sinusitis.  Pneumonia.  Asthma. TREATMENT  A URI goes away on its own with time. It cannot be cured with medicines, but medicines may be prescribed or recommended to relieve symptoms. Medicines may help:  Reduce your fever.  Reduce your cough.  Relieve nasal congestion. HOME CARE INSTRUCTIONS   Take medicines only as directed by your health care provider.   Gargle warm saltwater or take cough drops to comfort your throat as directed by your health care provider.  Use a warm mist humidifier or inhale steam from a shower to increase air moisture. This may make it easier to breathe.  Drink enough fluid to keep your urine clear or pale yellow.   Eat soups and other clear broths and maintain good nutrition.   Rest as needed.   Return to work when your temperature has returned to normal or as your health care provider advises. You may need to stay home longer to avoid infecting others. You can also use a face mask and careful hand washing to prevent spread of the virus.  Increase the usage of your inhaler if you have asthma.   Do not use any tobacco products, including cigarettes, chewing tobacco, or electronic cigarettes. If you need help quitting, ask your health care provider. PREVENTION  The best way to protect yourself from getting a cold is to practice good hygiene.   Avoid oral or hand contact with people with cold symptoms.   Wash your hands often if contact occurs.  There is no clear evidence that vitamin C, vitamin E, echinacea, or exercise reduces the chance of developing a cold. However, it is always recommended to get plenty of rest,  exercise, and practice good nutrition.  SEEK MEDICAL CARE IF:   You are getting worse rather than better.   Your symptoms are not controlled by medicine.   You have chills.  You have worsening shortness of breath.  You have [place] or red mucus.  You have yellow or Silverio nasal discharge.  You have pain in your face, especially when you bend forward.  You have a fever.  You have swollen neck glands.  You have pain while swallowing.  You have white areas in the back of your throat. SEEK IMMEDIATE MEDICAL CARE IF:   You have severe or persistent:  Headache.  Ear pain.  Sinus pain.  Chest pain.  You have chronic lung disease and any of the following:  Wheezing.  Prolonged cough.  Coughing up blood.  A change in your usual mucus.  You have a stiff neck.  You have changes in your:  Vision.  Hearing.  Thinking.  Mood. MAKE SURE YOU:   Understand these instructions.  Will watch your condition.  Will get help right away if you are not doing well or get worse.   This information is not intended to replace advice given to you by your health care provider. Make sure you discuss any questions you have with your health care provider.   Document Released: 09/13/2000 Document Revised: 08/04/2014 Document Reviewed: 06/25/2013 Elsevier Interactive Patient Education [employer].

## 2015-12-07 NOTE — Progress Notes (Signed)
   Subjective:    Patient ID: Dylan Morgan, male    DOB: 01-30-1946, 70 y.o.   MRN: [id number]  HPI  Patient seen and examined. He presents today for routine follow up of his peripheral neuropathy and to get his flu shot.  Patient states that the burning sensation in his feet improve with gabapentin but he only takes it intermittently when he the pain is bad as it cause him to feel fatigued.  He does complain of chronic cough. He states that he has been getting a recurrent cough over the last few months - since April and has had chest X rays which were unrevealing. Cough is productive of whitish phlegm, no hemoptysis, no weight loss, no loss of appetite, no n/v. He does have + sick contacts- says he was around a sick child a few weeks ago.   Review of Systems  Constitutional: Negative.   HENT: Negative for postnasal drip, rhinorrhea, sore throat, trouble swallowing and voice change.   Eyes: Negative.   Respiratory: Positive for cough. Negative for shortness of breath.        C/o occasional whezing  Cardiovascular: Negative.   Gastrointestinal: Negative.   Musculoskeletal: Negative.   Skin: Negative.   Neurological: Negative.   Psychiatric/Behavioral: Negative.        Objective:   Physical Exam  Constitutional: He is oriented to person, place, and time. He appears well-developed and well-nourished.  HENT:  Head: Normocephalic and atraumatic.  Mouth/Throat: No oropharyngeal exudate.  Eyes: Right eye exhibits no discharge. Left eye exhibits no discharge.  Neck: Normal range of motion. Neck supple.  Cardiovascular: Normal rate, regular rhythm and normal heart sounds.   Pulmonary/Chest: Effort normal and breath sounds normal. No stridor. No respiratory distress.  Scattered exp wheeze +  Abdominal: Soft. Bowel sounds are normal. He exhibits no distension. There is no tenderness.  Musculoskeletal: Normal range of motion. He exhibits no edema.  Lymphadenopathy:    He has no cervical  adenopathy.  Neurological: He is alert and oriented to person, place, and time.  Skin: Skin is warm. No rash noted. No erythema.  Psychiatric: He has a normal mood and affect. His behavior is normal.          Assessment & Plan:  Please see problem based charting for assessment and plan:

## 2015-12-07 NOTE — Assessment & Plan Note (Signed)
-   flu shot given today

## 2015-12-07 NOTE — Assessment & Plan Note (Signed)
-   Creatinine was stable at 1.4 when checked in June - Would recheck BMP on follow up - Uncertain etiology of his CKD as his BP has been well controlled - Will monitor and consider referral to nephro if creatinine worsens

## 2015-12-07 NOTE — Assessment & Plan Note (Signed)
BP Readings from Last 3 Encounters:  12/07/15 131/64  10/29/15 (!) 144/93  10/26/15 130/90    Lab Results  Component Value Date   NA 135 09/30/2015   K 4.0 09/30/2015   CREATININE 1.42 (H) 09/30/2015    Assessment: Blood pressure control:  well ocntrolled Progress toward BP goal:   at goal Comments: not on medications  Plan: Medications:  diet controlled Educational resources provided: brochure (denies need ) Self management tools provided:   Other plans:

## 2015-12-07 NOTE — Assessment & Plan Note (Signed)
-   Patient with persistent peripheral neuropathy of uncertain etiology. - Has had multiple A1C checked - last in 2015 which was wnl - Would recheck A1C on next visit.  - Would refer to neuro for further w/u - Symptoms are well controlled on gabapentin. Will c/w gabapentin for now

## 2015-12-07 NOTE — Assessment & Plan Note (Addendum)
-   Patient with recurrent, chronic cough over the last few months and has had multiple CXR since then for cough and pleuritic CP which have been wnl - Given smoking history (used to smoke 2 PPD quit in 2011) the recurrent cough is worrisome for possible malignancy - Would check CT chest for follow up - Would continue with conservative management for now - salt water gargle, cough drops, steam inhalation, robitussin prn

## 2015-12-13 ENCOUNTER — Other Ambulatory Visit: Payer: Self-pay | Admitting: *Deleted

## 2015-12-13 MED ORDER — WARFARIN SODIUM 5 MG PO TABS: ORAL_TABLET | ORAL | 3 refills | Status: DC

## 2015-12-13 NOTE — Telephone Encounter (Signed)
Pt called needing refill on coumadin  Pt called informed refill done

## 2015-12-15 ENCOUNTER — Encounter: Payer: Self-pay | Admitting: Internal Medicine

## 2015-12-15 ENCOUNTER — Ambulatory Visit (HOSPITAL_COMMUNITY)
Admission: RE | Admit: 2015-12-15 | Discharge: 2015-12-15 | Disposition: A | Discharge status: Home / Self Care | Payer: Medicare HMO | Source: Ambulatory Visit | Attending: Internal Medicine | Admitting: Internal Medicine

## 2015-12-15 DIAGNOSIS — X58XXXA Exposure to other specified factors, initial encounter: Secondary | ICD-10-CM | POA: Insufficient documentation

## 2015-12-15 DIAGNOSIS — Z23 Encounter for immunization: Secondary | ICD-10-CM | POA: Diagnosis not present

## 2015-12-15 DIAGNOSIS — I700 Atherosclerosis of aorta: Secondary | ICD-10-CM | POA: Diagnosis not present

## 2015-12-15 DIAGNOSIS — S2232XA Fracture of one rib, left side, initial encounter for closed fracture: Secondary | ICD-10-CM | POA: Insufficient documentation

## 2015-12-15 DIAGNOSIS — J984 Other disorders of lung: Secondary | ICD-10-CM | POA: Insufficient documentation

## 2015-12-15 DIAGNOSIS — Z951 Presence of aortocoronary bypass graft: Secondary | ICD-10-CM | POA: Insufficient documentation

## 2015-12-15 DIAGNOSIS — Z87891 Personal history of nicotine dependence: Secondary | ICD-10-CM | POA: Insufficient documentation

## 2015-12-15 DIAGNOSIS — R05 Cough: Secondary | ICD-10-CM | POA: Diagnosis not present

## 2015-12-15 DIAGNOSIS — R053 Chronic cough: Secondary | ICD-10-CM

## 2015-12-17 ENCOUNTER — Telehealth: Payer: Self-pay | Admitting: Interventional Cardiology

## 2015-12-17 ENCOUNTER — Other Ambulatory Visit: Payer: Self-pay

## 2015-12-17 DIAGNOSIS — Z13220 Encounter for screening for lipoid disorders: Secondary | ICD-10-CM

## 2015-12-17 NOTE — Telephone Encounter (Addendum)
**Note De-Identified  Obfuscation** See lab results from 8/25.

## 2015-12-17 NOTE — Telephone Encounter (Signed)
New message ° ° ° ° ° ° °Pt returning nurse call  °

## 2015-12-30 ENCOUNTER — Telehealth: Payer: Self-pay | Admitting: *Deleted

## 2015-12-30 ENCOUNTER — Ambulatory Visit (INDEPENDENT_AMBULATORY_CARE_PROVIDER_SITE_OTHER): Payer: Medicare HMO | Admitting: Neurology

## 2015-12-30 ENCOUNTER — Encounter: Payer: Self-pay | Admitting: Neurology

## 2015-12-30 VITALS — BP 163/86 | HR 72 | Ht 68.0 in | Wt 230.0 lb

## 2015-12-30 DIAGNOSIS — G6289 Other specified polyneuropathies: Secondary | ICD-10-CM | POA: Diagnosis not present

## 2015-12-30 DIAGNOSIS — E538 Deficiency of other specified B group vitamins: Secondary | ICD-10-CM | POA: Diagnosis not present

## 2015-12-30 MED ORDER — GABAPENTIN 100 MG PO CAPS: 100.0000 mg | ORAL_CAPSULE | Freq: Every day | ORAL | 2 refills | Status: DC

## 2015-12-30 NOTE — Telephone Encounter (Signed)
Opened in error while trying to f/u on referral. No call actually made at this time. Dylan Morgan, 12/31/15, 6:57 P

## 2015-12-30 NOTE — Progress Notes (Signed)
Reason for visit: Peripheral neuropathy  Referring physician: Dr. Colin Ina is a 70 y.o. male  History of present illness:  Mr. Scheiber is a 70 year old right-handed white male with a two-year history of gradually worsening numbness and tingling sensations in both feet. He mainly notes discomfort in the bottom the feet, but it will spread to the top of the feet at times with a burning, tingling, and sometimes shooting pain problem. The patient has also noted some mild changes in balance, he is fallen on one occasion a year ago while in the shower. The patient also notes some bilateral hand numbness. He has chronic issues with some neck and low back pain, he denies the pain radiates down the legs. He denies issues controlling the bowels or bladder. He has some slight sensation of weakness in the legs. He denies any family history of peripheral neuropathy. He has been given gabapentin to take at a 300 mg dose, but this caused too much drowsiness, he takes the medication only occasionally. He is sent to this office for an evaluation.  Past Medical History:  Diagnosis Date  . AAA (abdominal aortic aneurysm) ([place])    a. 2010 s/p stenting  . Arthritis   . Bradycardia   . CAD (coronary artery disease) CABG in 2004   a. 2004 s/p CABG  . Carotid artery disease ([place])    a. 2012 dopplers with old LICA occlusion , RICA no significant abnormality  . CVA (cerebral infarction)    a. 2013 R Carona radiata stroke   . GERD (gastroesophageal reflux disease)   . Hyperlipidemia   . Hypertension   . Paroxysmal atrial fibrillation ([place]) 02/22/2009   a. on Coumadin   . Prediabetes   . Renal insufficiency   . Seborrheic dermatitis of scalp   . Tobacco abuse     Past Surgical History:  Procedure Laterality Date  . ABDOMINAL AORTIC ANEURYSM REPAIR  2010   Aortic stent repair  . CORONARY ARTERY BYPASS GRAFT  2004  . ROTATOR CUFF REPAIR  1990    Family History  Problem Relation Age of  Onset  . Diabetes Mother   . Stroke Father   . Thyroid cancer Sister   . Heart disease Brother     Coronary artery disease  . Heart attack Brother     Social history:  reports that he quit smoking about 5 years ago. His smoking use included Cigarettes. He smoked 0.00 packs per day for 35.00 years. He has never used smokeless tobacco. He reports that he does not drink alcohol or use drugs.  Medications:  Prior to Admission medications   Medication Sig Start Date End Date Taking? Authorizing Provider  aspirin EC 81 MG tablet Take 81 mg by mouth every other day. After supper   Yes Historical Provider, MD  gabapentin (NEURONTIN) 300 MG capsule Take 1 capsule (300 mg total) by mouth at bedtime. 10/29/15 10/28/16 Yes Jule Ser, DO  Multiple Vitamin (MULTIVITAMIN) tablet Take 1 tablet by mouth daily.   Yes Historical Provider, MD  simvastatin (ZOCOR) 40 MG tablet TAKE ONE TABLET BY MOUTH ONCE DAILY AFTER  SUPPER 09/28/15  Yes Liliane Shi, PA-C  warfarin (COUMADIN) 5 MG tablet Take as directed by coumadin clinic 12/13/15  Yes Will Meredith Leeds, MD      Allergies  Allergen Reactions  . Oxycodone-Acetaminophen Other (See Comments)    vertigo  . Diltiazem Hcl Itching    ROS:  Out of a  complete 14 system review of symptoms, the patient complains only of the following symptoms, and all other reviewed systems are negative.  Weight gain Joint pain Numbness  Blood pressure (!) 163/86, pulse 72, height '5\' 8"'$  (1.727 m), weight 230 lb (104.3 kg).  Physical Exam  General: The patient is alert and cooperative at the time of the examination. The patient is moderately to markedly obese.  Eyes: Pupils are equal, round, and reactive to light. Discs are flat bilaterally.  Neck: The neck is supple, no carotid bruits are noted.  Respiratory: The respiratory examination is clear.  Cardiovascular: The cardiovascular examination reveals a regular rate and rhythm, no obvious murmurs or rubs  are noted.  Skin: Extremities are without significant edema.  Neurologic Exam  Mental status: The patient is alert and oriented x 3 at the time of the examination. The patient has apparent normal recent and remote memory, with an apparently normal attention span and concentration ability.  Cranial nerves: Facial symmetry is present. There is good sensation of the face to pinprick and soft touch bilaterally. The strength of the facial muscles and the muscles to head turning and shoulder shrug are normal bilaterally. Speech is well enunciated, no aphasia or dysarthria is noted. Extraocular movements are full, with exception of incomplete abduction of the left eye. Visual fields are full with the right eye, the patient has significant visual impairment with the left eye. The tongue is midline, and the patient has symmetric elevation of the soft palate. No obvious hearing deficits are noted.  Motor: The motor testing reveals 5 over 5 strength of all 4 extremities. Good symmetric motor tone is noted throughout.  Sensory: Sensory testing is intact to pinprick, soft touch, vibration sensation, and position sense on the upper extremities. With the lower extremities, there is a stocking pattern pinprick sensory deficit in the distal third of the legs bilaterally, severe impairment of vibration and position sense is seen in both feet. No evidence of extinction is noted.  Coordination: Cerebellar testing reveals good finger-nose-finger and heel-to-shin bilaterally.  Gait and station: Gait is normal. Tandem gait is unsteady. Romberg is negative. No drift is seen.  Reflexes: Deep tendon reflexes are symmetric and normal bilaterally, with exception of trace ankle jerk reflexes bilaterally. Toes are downgoing bilaterally.   Assessment/Plan:  1. Probable peripheral neuropathy  The patient has significant impairment of sensation in the feet, he reports discomfort in the feet that is worse in the evening  hours. He has not been able to tolerate the 300 mg dose of gabapentin, we will switch him to the 100 mg capsules to take 1 at night. He will be set up for blood work today, he will have nerve conduction studies on both legs and one arm, EMG on one leg. He will follow-up for the EMG study.  Jill Alexanders MD 12/30/2015 1:56 PM  [facility] [address] [place], [place] [address]  Phone [phone] Fax [phone]

## 2015-12-30 NOTE — Patient Instructions (Addendum)

## 2015-12-31 ENCOUNTER — Other Ambulatory Visit (INDEPENDENT_AMBULATORY_CARE_PROVIDER_SITE_OTHER): Payer: Self-pay

## 2015-12-31 ENCOUNTER — Ambulatory Visit (INDEPENDENT_AMBULATORY_CARE_PROVIDER_SITE_OTHER): Payer: Medicare HMO | Admitting: *Deleted

## 2015-12-31 DIAGNOSIS — I639 Cerebral infarction, unspecified: Secondary | ICD-10-CM

## 2015-12-31 DIAGNOSIS — I4891 Unspecified atrial fibrillation: Secondary | ICD-10-CM | POA: Diagnosis not present

## 2015-12-31 DIAGNOSIS — I480 Paroxysmal atrial fibrillation: Secondary | ICD-10-CM | POA: Diagnosis not present

## 2015-12-31 DIAGNOSIS — Z7901 Long term (current) use of anticoagulants: Secondary | ICD-10-CM | POA: Diagnosis not present

## 2015-12-31 DIAGNOSIS — G6289 Other specified polyneuropathies: Secondary | ICD-10-CM | POA: Diagnosis not present

## 2015-12-31 DIAGNOSIS — E538 Deficiency of other specified B group vitamins: Secondary | ICD-10-CM | POA: Diagnosis not present

## 2015-12-31 DIAGNOSIS — Z0289 Encounter for other administrative examinations: Secondary | ICD-10-CM

## 2015-12-31 LAB — POCT INR: INR: 2.8

## 2016-01-03 DIAGNOSIS — M1712 Unilateral primary osteoarthritis, left knee: Secondary | ICD-10-CM | POA: Diagnosis not present

## 2016-01-03 DIAGNOSIS — M1711 Unilateral primary osteoarthritis, right knee: Secondary | ICD-10-CM | POA: Diagnosis not present

## 2016-01-04 ENCOUNTER — Encounter (HOSPITAL_COMMUNITY): Payer: Medicare HMO

## 2016-01-04 ENCOUNTER — Ambulatory Visit: Payer: Medicare HMO | Admitting: Family

## 2016-01-04 ENCOUNTER — Telehealth: Payer: Self-pay | Admitting: Neurology

## 2016-01-04 LAB — MULTIPLE MYELOMA PANEL, SERUM
ALBUMIN/GLOB SERPL: 1.1 (ref 0.7–1.7)
ALPHA2 GLOB SERPL ELPH-MCNC: 0.9 g/dL (ref 0.4–1.0)
Albumin SerPl Elph-Mcnc: 3.6 g/dL (ref 2.9–4.4)
Alpha 1: 0.2 g/dL (ref 0.0–0.4)
B-GLOBULIN SERPL ELPH-MCNC: 1.1 g/dL (ref 0.7–1.3)
GAMMA GLOB SERPL ELPH-MCNC: 1.4 g/dL (ref 0.4–1.8)
GLOBULIN, TOTAL: 3.6 g/dL (ref 2.2–3.9)
IGG (IMMUNOGLOBIN G), SERUM: 1356 mg/dL (ref 700–1600)
IGM (IMMUNOGLOBULIN M), SRM: 40 mg/dL (ref 15–143)
IgA/Immunoglobulin A, Serum: 297 mg/dL (ref 61–437)
Total Protein: 7.2 g/dL (ref 6.0–8.5)

## 2016-01-04 LAB — B. BURGDORFI ANTIBODIES: Lyme IgG/IgM Ab: <0.91 {ISR} (ref 0.00–0.90)

## 2016-01-04 LAB — VITAMIN B12: VITAMIN B 12: 168 pg/mL — AB (ref 211–946)

## 2016-01-04 LAB — ANA W/REFLEX: Anti Nuclear Antibody(ANA): NEGATIVE

## 2016-01-04 LAB — RPR: RPR Ser Ql: NONREACTIVE

## 2016-01-04 LAB — RHEUMATOID FACTOR: Rhuematoid fact SerPl-aCnc: <10 IU/mL (ref 0.0–13.9)

## 2016-01-04 NOTE — Telephone Encounter (Signed)
I called patient. The blood work was unremarkable with exception that the vitamin B12 level is low. I will have the patient come in for a B12 injection, then he is to go on oral vitamin B12 supplementation taking 1000 g daily.

## 2016-01-05 NOTE — Telephone Encounter (Signed)
Spoke to pt who reports that he started taking B12 supplement PO yesterday. Agreed to get B12 injection next Tuesday when he is here for his nerve conduction study.

## 2016-01-11 ENCOUNTER — Ambulatory Visit (INDEPENDENT_AMBULATORY_CARE_PROVIDER_SITE_OTHER): Payer: Medicare HMO

## 2016-01-11 ENCOUNTER — Ambulatory Visit (INDEPENDENT_AMBULATORY_CARE_PROVIDER_SITE_OTHER): Payer: Medicare HMO | Admitting: Neurology

## 2016-01-11 ENCOUNTER — Encounter: Payer: Self-pay | Admitting: Neurology

## 2016-01-11 ENCOUNTER — Ambulatory Visit (INDEPENDENT_AMBULATORY_CARE_PROVIDER_SITE_OTHER): Payer: Self-pay | Admitting: Neurology

## 2016-01-11 DIAGNOSIS — G5603 Carpal tunnel syndrome, bilateral upper limbs: Secondary | ICD-10-CM | POA: Diagnosis not present

## 2016-01-11 DIAGNOSIS — G6289 Other specified polyneuropathies: Secondary | ICD-10-CM

## 2016-01-11 DIAGNOSIS — E538 Deficiency of other specified B group vitamins: Secondary | ICD-10-CM

## 2016-01-11 MED ORDER — DULOXETINE HCL 30 MG PO CPEP: 30.0000 mg | ORAL_CAPSULE | Freq: Every day | ORAL | 3 refills | Status: DC

## 2016-01-11 MED ORDER — CYANOCOBALAMIN 1000 MCG/ML IJ SOLN
1000.0000 ug | Freq: Once | INTRAMUSCULAR | Status: AC
  Administered 2016-01-11: 1000 ug via INTRAMUSCULAR

## 2016-01-11 NOTE — Procedures (Signed)
     HISTORY:  Dylan Morgan is a 70 year old gentleman with a two-year history of numbness in the feet and more recently some numbness in the hands as well. The patient is being evaluated for a possible peripheral neuropathy.  NERVE CONDUCTION STUDIES:  Nerve conduction studies were performed on both upper extremities. The distal motor latencies for the median nerves were prolonged bilaterally, with a borderline normal motor amplitude on the right, low motor amplitude on the left. The distal motor latencies for the ulnar nerves were normal bilaterally with a low motor amplitude on the left, normal on the right. The F wave latencies for the median nerves were prolonged bilaterally, borderline normal for the left ulnar nerve, normal for the right ulnar nerve. The nerve conduction velocities or the median and ulnar nerves were normal bilaterally. The sensory latencies for the median nerves were prolonged bilaterally, normal for the ulnar nerves bilaterally.  Nerve conduction studies were performed on both lower extremities. The left peroneal nerve revealed a normal distal motor latency with a low motor amplitude. The nerve conduction velocities for the this nerve were normal. There was no response for the right peroneal nerve and for the posterior tibial nerves bilaterally. The peroneal sensory latencies were unobtainable bilaterally.  EMG STUDIES:  EMG study was performed on the right lower extremity:  The tibialis anterior muscle reveals 2 to 5K motor units with decreased recruitment. No fibrillations or positive waves were seen. The peroneus tertius muscle reveals 2 to 5K motor units with decreased recruitment. No fibrillations or positive waves were seen. The medial gastrocnemius muscle reveals 1 to 4K motor units with decreased recruitment. 2+ positive waves were seen. The vastus lateralis muscle reveals 2 to 4K motor units with full recruitment. No fibrillations or positive waves were  seen. The iliopsoas muscle reveals 2 to 4K motor units with full recruitment. No fibrillations or positive waves were seen. The biceps femoris muscle (long head) reveals 2 to 4K motor units with slightly decreased recruitment. No fibrillations or positive waves were seen. The lumbosacral paraspinal muscles were tested at 3 levels, and revealed 2+ positive waves at all 3 levels tested. There was good relaxation.   IMPRESSION:  Nerve conduction studies done on all 4 extremities shows evidence of a mild right carpal tunnel syndrome and a left carpal tunnel syndrome of moderate severity. There is evidence of an overlying severe primarily axonal peripheral neuropathy. EMG evaluation of the right lower extremity shows acute and chronic distal denervation that is consistent with the diagnosis of peripheral neuropathy. Cannot fully exclude an overlying chronic L5 or chronic and acute S1 radiculopathy on the right. Clinical correlation is required.  Jill Alexanders MD 01/11/2016 2:53 PM  [facility] [address] [place], [place] [address]  Phone [phone] Fax [phone]

## 2016-01-11 NOTE — Progress Notes (Signed)
Pt arrives for B 12 injection.Cyanocobalamin 1034mg/1ml administered IM to R deltoid using aseptic technique. Pt tolerated well.Bandaid applied.

## 2016-01-13 ENCOUNTER — Ambulatory Visit (INDEPENDENT_AMBULATORY_CARE_PROVIDER_SITE_OTHER): Payer: Medicare HMO | Admitting: Internal Medicine

## 2016-01-13 VITALS — BP 164/90 | HR 99 | Temp 98.8°F | Wt 234.7 lb

## 2016-01-13 DIAGNOSIS — G629 Polyneuropathy, unspecified: Secondary | ICD-10-CM | POA: Diagnosis not present

## 2016-01-13 DIAGNOSIS — G5603 Carpal tunnel syndrome, bilateral upper limbs: Secondary | ICD-10-CM

## 2016-01-13 MED ORDER — PREDNISONE 20 MG PO TABS: 20.0000 mg | ORAL_TABLET | Freq: Every day | ORAL | 0 refills | Status: DC

## 2016-01-13 MED ORDER — PREGABALIN 50 MG PO CAPS: 50.0000 mg | ORAL_CAPSULE | Freq: Three times a day (TID) | ORAL | 2 refills | Status: DC

## 2016-01-13 NOTE — Assessment & Plan Note (Signed)
Bilateral hand weakness and numbness consistent with carpal tunnel syndrome, recently confirmed by EMG/NCS.  He symptoms are moderate-severe, with functional grip impairments and thenar atrophy. -Wrist splinting -Prednisone 20 mg PO for 2 weeks -Hand surgery referral for carpal tunnel release evaluation

## 2016-01-13 NOTE — Progress Notes (Deleted)
.  imcc

## 2016-01-13 NOTE — Progress Notes (Signed)
CC: wrist pain  HPI:  [name] is a 70 y.o. man with history of CAD (s/p CABG), peripheral neuropathy, and B12 deficiency who presents for follow-up of [place] carpal tunnel syndrome.  Last seen in clinic on 12/07/2015 for chronic cough and peripheral neuropathy. He has had worsening numbness and weakness in his hands for the last 6-12 months.  It has gotten to the point that he has troubles picking up cups and cannot open jars.  Seen by neurology on 9/28 for peripheral neuropathy.  Prescribed Gabapentin 100 mg QHS since he was unable to tolerate 300 mg due to somnolence.  Labwork showed low B12, given 1000 mcg IM B12 on 01/11/16 and started on B12 1000 mcg daily PO.  NCS on 01/11/16 with evidence of bilateral carpal tunnel syndrome as well as peripheral neuropathy,  He has previously tried gabapentin and duloxetine for neuropathic pain and was unable to tolerate either due to somnolence.   Past Medical History:  Diagnosis Date  . AAA (abdominal aortic aneurysm) ([place])    a. 2010 s/p stenting  . Arthritis   . Bradycardia   . CAD (coronary artery disease) CABG in 2004   a. 2004 s/p CABG  . Carotid artery disease ([place])    a. 2012 dopplers with old LICA occlusion , RICA no significant abnormality  . CVA (cerebral infarction)    a. 2013 R Carona radiata stroke   . GERD (gastroesophageal reflux disease)   . Hyperlipidemia   . Hypertension   . Paroxysmal atrial fibrillation ([place]) 02/22/2009   a. on Coumadin   . Prediabetes   . Renal insufficiency   . Seborrheic dermatitis of scalp   . Tobacco abuse     Review of Systems: Review of Systems  Constitutional: Negative for chills and fever.  Eyes: Negative for double vision.  Neurological: Positive for tingling, sensory change and focal weakness. Negative for headaches.     Physical Exam:  Vitals:   01/13/16 1313  BP: (!) 164/90  Pulse: 99  Temp: 98.8 F (37.1 C)  TempSrc: Oral  SpO2: 99%  Weight: 234 lb 11.2 oz (106.5  kg)   Physical Exam  Constitutional: He is oriented to person, place, and time. He appears well-developed and well-nourished. No distress.  Eyes:  OS NLP  OS restricted abduction and adduction S/p trauma   Cardiovascular: Normal rate and regular rhythm.   Pulmonary/Chest: Effort normal and breath sounds normal.  Musculoskeletal:  L > R thenar atrophy  Neurological: He is alert and oriented to person, place, and time.  Stocking glove pattern reduction in nociceptive sensation to elbows and knees. DTRs 2+ symmetric patella, 1+ R ankle L ankle mute Grip 4+/5 symmetric Sensation reduced to light touch throughout R hand, and fingers 1-3 > 4,5 in L hand  Psychiatric: He has a normal mood and affect. His behavior is normal.   EMG/Nerve Conduction Study (01/11/2016)  IMPRESSION:  Nerve conduction studies done on all 4 extremities shows evidence of a mild right carpal tunnel syndrome and a left carpal tunnel syndrome of moderate severity. There is evidence of an overlying severe primarily axonal peripheral neuropathy. EMG evaluation of the right lower extremity shows acute and chronic distal denervation that is consistent with the diagnosis of peripheral neuropathy. Cannot fully exclude an overlying chronic L5 or chronic and acute S1 radiculopathy on the right. Clinical correlation is required.  Assessment & Plan:   See Encounters Tab for problem based charting.  Patient seen with Dr. Dareen Piano

## 2016-01-13 NOTE — Assessment & Plan Note (Signed)
Burning pain in feet, pronounced distal sensory impairment and NCS showing axonal neuropathy.  Has previously been unable to tolerate gabapentin and duloextine due to somnolence.  Workup by neurologist included low B12, but normal RF, ANA, lyme, and RPR.  Received 1x parenteral B12 and on PO B12 daily. -Lyrica 50 mg TID -Continue PO B12 -F/u with neurology

## 2016-01-13 NOTE — Patient Instructions (Signed)
You were seen in clinic for carpal tunnel syndrome and peripheral neuropathy.  The EMG and nerve conduction study performed by your neurologist showed carpal tunnel syndrome in both hands as well as peripheral neuropathy.  For your peripheral neuropathy pain, I will prescribe Lyrica 50 mg to take three times per day for nerve pain.  For your carpal tunnel syndrome, I want you to go to the pharmacy and buy a rigid wrist splint for each hand and wear them every night to relieve pressure on the nerves in your wrists.  You can also wear them during the day if you want.  I will also prescribe 2 weeks of prednisone 20 mg to take once per day to relieve inflammation.  I will refer you to a Hand Surgeon in order to evaluate surgical options for treatment of your carpal tunnel syndrome.   Carpal Tunnel Syndrome Carpal tunnel syndrome is a condition that causes pain in your hand and arm. The carpal tunnel is a narrow area located on the palm side of your wrist. Repeated wrist motion or certain diseases may cause swelling within the tunnel. This swelling pinches the main nerve in the wrist (median nerve). CAUSES  This condition may be caused by:   Repeated wrist motions.  Wrist injuries.  Arthritis.  A cyst or tumor in the carpal tunnel.  Fluid buildup during pregnancy. Sometimes the cause of this condition is not known.  RISK FACTORS This condition is more likely to develop in:   People who have jobs that cause them to repeatedly move their wrists in the same motion, such as butchers and cashiers.  Women.  People with certain conditions, such as:  Diabetes.  Obesity.  An underactive thyroid (hypothyroidism).  Kidney failure. SYMPTOMS  Symptoms of this condition include:   A tingling feeling in your fingers, especially in your thumb, index, and middle fingers.  Tingling or numbness in your hand.  An aching feeling in your entire arm, especially when your wrist and elbow are bent  for long periods of time.  Wrist pain that goes up your arm to your shoulder.  Pain that goes down into your palm or fingers.  A weak feeling in your hands. You may have trouble grabbing and holding items. Your symptoms may feel worse during the night.  DIAGNOSIS  This condition is diagnosed with a medical history and physical exam. You may also have tests, including:   An electromyogram (EMG). This test measures electrical signals sent by your nerves into the muscles.  X-rays. TREATMENT  Treatment for this condition includes:  Lifestyle changes. It is important to stop doing or modify the activity that caused your condition.  Physical or occupational therapy.  Medicines for pain and inflammation. This may include medicine that is injected into your wrist.  A wrist splint.  Surgery. HOME CARE INSTRUCTIONS  If You Have a Splint:  Wear it as told by your health care provider. Remove it only as told by your health care provider.  Loosen the splint if your fingers become numb and tingle, or if they turn cold and blue.  Keep the splint clean and dry. General Instructions  Take over-the-counter and prescription medicines only as told by your health care provider.  Rest your wrist from any activity that may be causing your pain. If your condition is work related, talk to your employer about changes that can be made, such as getting a wrist pad to use while typing.  If directed, apply ice to  the painful area:  Put ice in a plastic bag.  Place a towel between your skin and the bag.  Leave the ice on for 20 minutes, 2-3 times per day.  Keep all follow-up visits as told by your health care provider. This is important.  Do any exercises as told by your health care provider, physical therapist, or occupational therapist. Cisco IF:   You have new symptoms.  Your pain is not controlled with medicines.  Your symptoms get worse.   This information is not intended  to replace advice given to you by your health care provider. Make sure you discuss any questions you have with your health care provider.   Document Released: 03/17/2000 Document Revised: 12/09/2014 Document Reviewed: 08/05/2014 Elsevier Interactive Patient Education [employer].

## 2016-01-16 NOTE — Progress Notes (Signed)
Internal Medicine Clinic Attending  I saw and evaluated the patient.  I personally confirmed the key portions of the history and exam documented by Dr. O'Sullivan and I reviewed pertinent patient test results.  The assessment, diagnosis, and plan were formulated together and I agree with the documentation in the resident's note.   

## 2016-01-18 ENCOUNTER — Telehealth: Payer: Self-pay | Admitting: *Deleted

## 2016-01-18 ENCOUNTER — Emergency Department (HOSPITAL_COMMUNITY): Payer: Medicare HMO

## 2016-01-18 ENCOUNTER — Encounter (HOSPITAL_COMMUNITY): Payer: Self-pay | Admitting: Emergency Medicine

## 2016-01-18 ENCOUNTER — Observation Stay (HOSPITAL_COMMUNITY)
Admission: EM | Admit: 2016-01-18 | Discharge: 2016-01-20 | Disposition: A | Discharge status: Skilled Nursing Facility | Payer: Medicare HMO | Attending: Internal Medicine | Admitting: Internal Medicine

## 2016-01-18 DIAGNOSIS — Z87891 Personal history of nicotine dependence: Secondary | ICD-10-CM | POA: Insufficient documentation

## 2016-01-18 DIAGNOSIS — Z951 Presence of aortocoronary bypass graft: Secondary | ICD-10-CM | POA: Diagnosis not present

## 2016-01-18 DIAGNOSIS — I2510 Atherosclerotic heart disease of native coronary artery without angina pectoris: Secondary | ICD-10-CM | POA: Insufficient documentation

## 2016-01-18 DIAGNOSIS — S82142A Displaced bicondylar fracture of left tibia, initial encounter for closed fracture: Secondary | ICD-10-CM | POA: Diagnosis not present

## 2016-01-18 DIAGNOSIS — I129 Hypertensive chronic kidney disease with stage 1 through stage 4 chronic kidney disease, or unspecified chronic kidney disease: Secondary | ICD-10-CM | POA: Diagnosis not present

## 2016-01-18 DIAGNOSIS — G4730 Sleep apnea, unspecified: Secondary | ICD-10-CM | POA: Diagnosis not present

## 2016-01-18 DIAGNOSIS — Z7901 Long term (current) use of anticoagulants: Secondary | ICD-10-CM | POA: Insufficient documentation

## 2016-01-18 DIAGNOSIS — I480 Paroxysmal atrial fibrillation: Secondary | ICD-10-CM | POA: Insufficient documentation

## 2016-01-18 DIAGNOSIS — Z23 Encounter for immunization: Secondary | ICD-10-CM | POA: Insufficient documentation

## 2016-01-18 DIAGNOSIS — R2689 Other abnormalities of gait and mobility: Secondary | ICD-10-CM

## 2016-01-18 DIAGNOSIS — W1809XA Striking against other object with subsequent fall, initial encounter: Secondary | ICD-10-CM | POA: Insufficient documentation

## 2016-01-18 DIAGNOSIS — N183 Chronic kidney disease, stage 3 (moderate): Secondary | ICD-10-CM | POA: Insufficient documentation

## 2016-01-18 DIAGNOSIS — Z79899 Other long term (current) drug therapy: Secondary | ICD-10-CM | POA: Diagnosis not present

## 2016-01-18 DIAGNOSIS — M1990 Unspecified osteoarthritis, unspecified site: Secondary | ICD-10-CM | POA: Diagnosis not present

## 2016-01-18 DIAGNOSIS — S82122A Displaced fracture of lateral condyle of left tibia, initial encounter for closed fracture: Secondary | ICD-10-CM | POA: Diagnosis not present

## 2016-01-18 DIAGNOSIS — Z7982 Long term (current) use of aspirin: Secondary | ICD-10-CM | POA: Insufficient documentation

## 2016-01-18 DIAGNOSIS — M25562 Pain in left knee: Secondary | ICD-10-CM | POA: Diagnosis not present

## 2016-01-18 DIAGNOSIS — Z7952 Long term (current) use of systemic steroids: Secondary | ICD-10-CM | POA: Diagnosis not present

## 2016-01-18 DIAGNOSIS — E785 Hyperlipidemia, unspecified: Secondary | ICD-10-CM | POA: Insufficient documentation

## 2016-01-18 DIAGNOSIS — G629 Polyneuropathy, unspecified: Secondary | ICD-10-CM | POA: Diagnosis not present

## 2016-01-18 DIAGNOSIS — Z8673 Personal history of transient ischemic attack (TIA), and cerebral infarction without residual deficits: Secondary | ICD-10-CM | POA: Diagnosis not present

## 2016-01-18 LAB — CBC WITH DIFFERENTIAL/PLATELET
BASOS ABS: 0 10*3/uL (ref 0.0–0.1)
BASOS PCT: 0 %
Eosinophils Absolute: 0 10*3/uL (ref 0.0–0.7)
Eosinophils Relative: 0 %
HEMATOCRIT: 42 % (ref 39.0–52.0)
HEMOGLOBIN: 13.9 g/dL (ref 13.0–17.0)
Lymphocytes Relative: 17 %
Lymphs Abs: 0.6 10*3/uL — ABNORMAL LOW (ref 0.7–4.0)
MCH: 29 pg (ref 26.0–34.0)
MCHC: 33.1 g/dL (ref 30.0–36.0)
MCV: 87.7 fL (ref 78.0–100.0)
Monocytes Absolute: 0.2 10*3/uL (ref 0.1–1.0)
Monocytes Relative: 4 %
NEUTROS ABS: 2.9 10*3/uL (ref 1.7–7.7)
NEUTROS PCT: 79 %
Platelets: 169 10*3/uL (ref 150–400)
RBC: 4.79 MIL/uL (ref 4.22–5.81)
RDW: 13.7 % (ref 11.5–15.5)
WBC: 3.7 10*3/uL — AB (ref 4.0–10.5)

## 2016-01-18 LAB — URINE MICROSCOPIC-ADD ON

## 2016-01-18 LAB — URINALYSIS, ROUTINE W REFLEX MICROSCOPIC
BILIRUBIN URINE: NEGATIVE
Glucose, UA: NEGATIVE mg/dL
HGB URINE DIPSTICK: NEGATIVE
KETONES UR: NEGATIVE mg/dL
NITRITE: POSITIVE — AB
Protein, ur: NEGATIVE mg/dL
Specific Gravity, Urine: 1.02 (ref 1.005–1.030)
pH: 5.5 (ref 5.0–8.0)

## 2016-01-18 LAB — BASIC METABOLIC PANEL
ANION GAP: 8 (ref 5–15)
BUN: 23 mg/dL — ABNORMAL HIGH (ref 6–20)
CALCIUM: 9.3 mg/dL (ref 8.9–10.3)
CHLORIDE: 106 mmol/L (ref 101–111)
CO2: 24 mmol/L (ref 22–32)
Creatinine, Ser: 1.45 mg/dL — ABNORMAL HIGH (ref 0.61–1.24)
GFR calc non Af Amer: 47 mL/min — ABNORMAL LOW (ref >60)
GFR, EST AFRICAN AMERICAN: 54 mL/min — AB (ref >60)
Glucose, Bld: 143 mg/dL — ABNORMAL HIGH (ref 65–99)
Potassium: 4.7 mmol/L (ref 3.5–5.1)
SODIUM: 138 mmol/L (ref 135–145)

## 2016-01-18 LAB — PROTIME-INR
INR: 2.44
Prothrombin Time: 27 seconds — ABNORMAL HIGH (ref 11.4–15.2)

## 2016-01-18 MED ORDER — PREGABALIN 50 MG PO CAPS
50.0000 mg | ORAL_CAPSULE | Freq: Three times a day (TID) | ORAL | Status: DC
  Administered 2016-01-18 – 2016-01-20 (×6): 50 mg via ORAL
  Filled 2016-01-18 (×6): qty 1

## 2016-01-18 MED ORDER — ONDANSETRON HCL 4 MG/2ML IJ SOLN: 4.0000 mg | Freq: Three times a day (TID) | INTRAMUSCULAR | Status: DC | PRN

## 2016-01-18 MED ORDER — GABAPENTIN 100 MG PO CAPS
100.0000 mg | ORAL_CAPSULE | Freq: Every day | ORAL | Status: DC
  Administered 2016-01-19 – 2016-01-20 (×2): 100 mg via ORAL
  Filled 2016-01-18 (×3): qty 1

## 2016-01-18 MED ORDER — WARFARIN SODIUM 5 MG PO TABS
5.0000 mg | ORAL_TABLET | Freq: Every day | ORAL | Status: DC
  Administered 2016-01-19: 5 mg via ORAL
  Filled 2016-01-18: qty 1

## 2016-01-18 MED ORDER — SENNOSIDES-DOCUSATE SODIUM 8.6-50 MG PO TABS: 1.0000 | ORAL_TABLET | Freq: Every evening | ORAL | Status: DC | PRN

## 2016-01-18 MED ORDER — HYDROCODONE-ACETAMINOPHEN 5-325 MG PO TABS
1.0000 | ORAL_TABLET | Freq: Once | ORAL | Status: AC
Start: 2016-01-18 — End: 2016-01-18
  Administered 2016-01-18: 1 via ORAL
  Filled 2016-01-18: qty 1

## 2016-01-18 MED ORDER — VITAMIN B-12 1000 MCG PO TABS
1000.0000 ug | ORAL_TABLET | Freq: Every day | ORAL | Status: DC
  Administered 2016-01-19 – 2016-01-20 (×2): 1000 ug via ORAL
  Filled 2016-01-18 (×3): qty 1

## 2016-01-18 MED ORDER — LIDOCAINE-EPINEPHRINE 2 %-1:100000 IJ SOLN
10.0000 mL | Freq: Once | INTRAMUSCULAR | Status: DC
  Filled 2016-01-18: qty 10

## 2016-01-18 MED ORDER — ADULT MULTIVITAMIN W/MINERALS CH
1.0000 | ORAL_TABLET | Freq: Every day | ORAL | Status: DC
  Administered 2016-01-19 – 2016-01-20 (×2): 1 via ORAL
  Filled 2016-01-18 (×3): qty 1

## 2016-01-18 MED ORDER — ACETAMINOPHEN 325 MG PO TABS: 650.0000 mg | ORAL_TABLET | Freq: Four times a day (QID) | ORAL | Status: DC | PRN

## 2016-01-18 MED ORDER — SIMVASTATIN 40 MG PO TABS
40.0000 mg | ORAL_TABLET | Freq: Every day | ORAL | Status: DC
  Administered 2016-01-18 – 2016-01-20 (×3): 40 mg via ORAL
  Filled 2016-01-18 (×3): qty 1

## 2016-01-18 MED ORDER — MORPHINE SULFATE (PF) 4 MG/ML IV SOLN
4.0000 mg | Freq: Once | INTRAVENOUS | Status: AC
  Administered 2016-01-18: 4 mg via INTRAVENOUS
  Filled 2016-01-18: qty 1

## 2016-01-18 MED ORDER — ASPIRIN EC 81 MG PO TBEC
81.0000 mg | DELAYED_RELEASE_TABLET | ORAL | Status: DC
  Administered 2016-01-19: 81 mg via ORAL
  Filled 2016-01-18: qty 1

## 2016-01-18 MED ORDER — OXYCODONE HCL 5 MG PO TABS
5.0000 mg | ORAL_TABLET | ORAL | Status: DC | PRN
  Administered 2016-01-18 – 2016-01-20 (×6): 5 mg via ORAL
  Filled 2016-01-18 (×7): qty 1

## 2016-01-18 MED ORDER — WARFARIN - PHYSICIAN DOSING INPATIENT: Freq: Every day | Status: DC

## 2016-01-18 MED ORDER — DULOXETINE HCL 30 MG PO CPEP
30.0000 mg | ORAL_CAPSULE | Freq: Every day | ORAL | Status: DC
  Administered 2016-01-19 – 2016-01-20 (×2): 30 mg via ORAL
  Filled 2016-01-18 (×3): qty 1

## 2016-01-18 MED ORDER — ACETAMINOPHEN 650 MG RE SUPP: 650.0000 mg | Freq: Four times a day (QID) | RECTAL | Status: DC | PRN

## 2016-01-18 NOTE — Progress Notes (Signed)
Orthopedic Tech Progress Note Patient Details:  Dylan Morgan [birth year] [id number] Applied Ace to Lt knee under Knee Immobilizer, per request of Tatyana Kirichenko PA-C. Ortho Devices Type of Ortho Device: Ace wrap Ortho Device/Splint Location: Lt Knee Ortho Device/Splint Interventions: Application, Ordered   Charlott Rakes 01/18/2016, 3:37 PM

## 2016-01-18 NOTE — Progress Notes (Signed)
Orthopedic Tech Progress Note Patient Details:  Dylan Morgan 30-Dec-1945 [id number] Order was put in for crutches, patient is unable to ambulate with crutches properly. Did not give patient crutches. Ortho Devices Type of Ortho Device: Knee Immobilizer Ortho Device/Splint Location: Lt Knee Ortho Device/Splint Interventions: Ordered, Application   Charlott Rakes 01/18/2016, 3:20 PM

## 2016-01-18 NOTE — ED Notes (Signed)
Ortho tech paged for knee immbolizer and crutches

## 2016-01-18 NOTE — Telephone Encounter (Signed)
Fax from [facility] stating that Lyrica has been prior approved for patient effective 02-17-2015 thru 04/02/2016.  Sander Nephew, RN 01/18/2016 8:48 AM.

## 2016-01-18 NOTE — ED Notes (Signed)
Ortho tech repaged to [phone].

## 2016-01-18 NOTE — Progress Notes (Signed)
Report received from [facility] for admission to [phone]

## 2016-01-18 NOTE — ED Notes (Signed)
Patient transported to CT 

## 2016-01-18 NOTE — ED Provider Notes (Signed)
[facility] DEPT Provider Note   CSN: [id number] Arrival date & time: 01/18/16  2585     History   Chief Complaint Chief Complaint  Patient presents with  . Fall    HPI Dylan Morgan is a 70 y.o. male.  HPI Dylan Morgan is a 70 y.o. male with history of prior CVA, coronary artery disease, hypertension, AAA, presents to emergency department after a fall. Patient states that he was getting out of the car, and was walking in the dark, when he did not notice a parking bump. He states he tripped over the bump and fell forward landing on his left knee. He did not hit his head or lost consciousness. He denies any pain to his head, neck, back. No pain to his chest or abdomen. No pain to his upper extremities. States the only thing that is bothering him his left knee. He is not able to walk on that leg. He did drive himself to the ED. Did not take any medications for the pain this morning. Denies any prior knee injuries or pain. Denies any numbness or weakness distal to the injury. Pt is on coumadin  Past Medical History:  Diagnosis Date  . AAA (abdominal aortic aneurysm) ([place])    a. 2010 s/p stenting  . Arthritis   . Bradycardia   . CAD (coronary artery disease) CABG in 2004   a. 2004 s/p CABG  . Carotid artery disease ([place])    a. 2012 dopplers with old LICA occlusion , RICA no significant abnormality  . CVA (cerebral infarction)    a. 2013 R Carona radiata stroke   . GERD (gastroesophageal reflux disease)   . Hyperlipidemia   . Hypertension   . Paroxysmal atrial fibrillation ([place]) 02/22/2009   a. on Coumadin   . Prediabetes   . Renal insufficiency   . Seborrheic dermatitis of scalp   . Tobacco abuse     Patient Active Problem List   Diagnosis Date Noted  . Carpal tunnel syndrome, bilateral 01/11/2016  . Chronic cough 06/01/2015  . S/P AAA (abdominal aortic aneurysm) repair 06/21/2014  . Peripheral neuropathy ([place]) 01/16/2014  . Onychomycosis 01/10/2013  . Chronic  anticoagulation 05/01/2012  . Neck pain, chronic 03/13/2012  . H/O TIA (transient ischemic attack) and stroke 01/02/2012  . Occlusion and stenosis of carotid artery without mention of cerebral infarction 12/19/2011  . TIA (transient ischemic attack) 11/23/2011  . Preventative health care 09/20/2011  . CVA (cerebral infarction)   . Prediabetes   . Long term (current) use of anticoagulants 08/04/2011  . Carotid artery disease ([place])   . CAD (coronary artery disease)   . Hypertension   . Hyperlipidemia   . Ejection fraction   . Hx of CABG   . AAA (abdominal aortic aneurysm) ([place])   . Erectile dysfunction 02/06/2011  . Paroxysmal atrial fibrillation ([place]) 02/22/2009  . DEPRESSIVE DISORDER 02/04/2007  . SLEEP APNEA 02/04/2007  . GERD 02/20/2006  . CKD (chronic kidney disease), stage III 02/20/2006    Past Surgical History:  Procedure Laterality Date  . ABDOMINAL AORTIC ANEURYSM REPAIR  2010   Aortic stent repair  . CORONARY ARTERY BYPASS GRAFT  2004  . ROTATOR CUFF REPAIR  1990       Home Medications    Prior to Admission medications   Medication Sig Start Date End Date Taking? Authorizing Provider  aspirin EC 81 MG tablet Take 81 mg by mouth every other day. After supper    Historical Provider,  MD  Multiple Vitamin (MULTIVITAMIN) tablet Take 1 tablet by mouth daily.    Historical Provider, MD  predniSONE (DELTASONE) 20 MG tablet Take 1 tablet (20 mg total) by mouth daily. 01/13/16 01/12/17  Minus Liberty, MD  pregabalin (LYRICA) 50 MG capsule Take 1 capsule (50 mg total) by mouth 3 (three) times daily. 01/13/16 01/12/17  Minus Liberty, MD  simvastatin (ZOCOR) 40 MG tablet TAKE ONE TABLET BY MOUTH ONCE DAILY AFTER  SUPPER 09/28/15   Liliane Shi, PA-C  vitamin B-12 (CYANOCOBALAMIN) 1000 MCG tablet Take 1,000 mcg by mouth daily.    Historical Provider, MD  warfarin (COUMADIN) 5 MG tablet Take as directed by coumadin clinic 12/13/15   Will Meredith Leeds, MD     Family History Family History  Problem Relation Age of Onset  . Diabetes Mother   . Stroke Father   . Thyroid cancer Sister   . Heart disease Brother     Coronary artery disease  . Heart attack Brother   . Heart attack Brother     Social History Social History  Substance Use Topics  . Smoking status: Former Smoker    Packs/day: 0.00    Years: 35.00    Types: Cigarettes    Quit date: 02/03/2010  . Smokeless tobacco: Never Used  . Alcohol use No     Allergies   Oxycodone-acetaminophen and Diltiazem hcl   Review of Systems Review of Systems  Constitutional: Negative for chills and fever.  Respiratory: Negative for cough, chest tightness and shortness of breath.   Cardiovascular: Negative for chest pain, palpitations and leg swelling.  Gastrointestinal: Negative for abdominal distention, abdominal pain, diarrhea, nausea and vomiting.  Genitourinary: Negative for dysuria, frequency, hematuria and urgency.  Musculoskeletal: Positive for arthralgias and joint swelling. Negative for myalgias, neck pain and neck stiffness.  Skin: Negative for rash.  Allergic/Immunologic: Negative for immunocompromised state.  Neurological: Negative for dizziness, weakness, light-headedness, numbness and headaches.  All other systems reviewed and are negative.    Physical Exam Updated Vital Signs BP 151/97 (BP Location: Left Arm)   Pulse 88   Temp 97.7 F (36.5 C) (Oral)   Resp 20   SpO2 95%   Physical Exam  Constitutional: He appears well-developed and well-nourished. No distress.  HENT:  Head: Normocephalic and atraumatic.  Eyes: Conjunctivae are normal.  Neck: Neck supple.  Cardiovascular: Normal rate, regular rhythm and normal heart sounds.   Pulmonary/Chest: Effort normal. No respiratory distress. He has no wheezes. He has no rales.  Abdominal: Soft. Bowel sounds are normal. He exhibits no distension. There is no tenderness. There is no rebound.  Musculoskeletal: He  exhibits no edema.  Bruising and swelling noted just distal to the left knee. Tenderness to palpation over anterior knee. No tenderness over the medial or lateral joint. The posterior joint tenderness. Pain with any range of motion of the knee joint. Negative anterior-posterior drawer signs. No laxity with medial lateral stress. Normal ankle and left foot. Dorsal pedal pulses intact.  Neurological: He is alert.  Skin: Skin is warm and dry.  Nursing note and vitals reviewed.    ED Treatments / Results  Labs (all labs ordered are listed, but only abnormal results are displayed) Labs Reviewed  PROTIME-INR - Abnormal; Notable for the following:       Result Value   Prothrombin Time 27.0 (*)    All other components within normal limits  CBC WITH DIFFERENTIAL/PLATELET - Abnormal; Notable for the following:    WBC 3.7 (*)  Lymphs Abs 0.6 (*)    All other components within normal limits  BASIC METABOLIC PANEL - Abnormal; Notable for the following:    Glucose, Bld 143 (*)    BUN 23 (*)    Creatinine, Ser 1.45 (*)    GFR calc non Af Amer 47 (*)    GFR calc Af Amer 54 (*)    All other components within normal limits    EKG  EKG Interpretation None       Radiology Ct Knee Left Wo Contrast  Result Date: 01/18/2016 CLINICAL DATA:  Status post fall.  Severe pain. EXAM: CT OF THE LEFT KNEE WITHOUT CONTRAST TECHNIQUE: Multidetector CT imaging of the LEFT knee was performed according to the standard protocol. Multiplanar CT image reconstructions were also generated. COMPARISON:  None. FINDINGS: Bones/Joint/Cartilage Generalized osteopenia. Left lateral tibial plateau fracture extending to the lateral margin of the lateral tibial spine and involving the periphery of the anteromedial lateral tibial plateau. No significant depression. No medial tibial plateau fracture. No other fracture or dislocation. Large joint effusion.  No Baker cyst. Normal alignment. Severe osteoarthritis of the lateral  patellofemoral compartment. Chondrocalcinosis of the medial and lateral femorotibial compartments as can be seen with CPPD. Dystrophic calcification in the patellofemoral compartment extending into the suprapatellar joint space concerning for calcified synovium. Ligaments Suboptimally assessed by CT.  ACL and PCL are grossly intact. Muscles and Tendons Muscles are normal. No significant muscle atrophy. No intramuscular fluid collection or hematoma. Intact quadriceps and patellar tendon. Soft tissues No fluid collection or hematoma. Peripheral vascular atherosclerotic disease. IMPRESSION: 1. Left lateral tibial plateau fracture without significant depression. Moderate-sized joint effusion. 2. Severe osteoarthritis of the lateral patellofemoral compartment. Electronically Signed   By: Kathreen Devoid   On: 01/18/2016 12:09   Dg Knee Complete 4 Views Left  Result Date: 01/18/2016 CLINICAL DATA:  Fall, knee pain EXAM: LEFT KNEE - COMPLETE 4+ VIEW COMPARISON:  None. FINDINGS: Mild chondrocalcinosis in the medial lateral joint. No significant joint space narrowing. Mild lateral joint margin spurring. Advanced degenerative change in the patellofemoral joint. Calcified loose bodies in the suprapatellar bursa. Small associated joint effusion Negative for fracture IMPRESSION: Severe degenerative change in the patellofemoral joint with calcified loose bodies in the suprapatellar bursa. No acute fracture. Electronically Signed   By: Franchot Gallo M.D.   On: 01/18/2016 09:21    Procedures Procedures (including critical care time)  Medications Ordered in ED Medications  HYDROcodone-acetaminophen (NORCO/VICODIN) 5-325 MG per tablet 1 tablet (not administered)     Initial Impression / Assessment and Plan / ED Course  I have reviewed the triage vital signs and the nursing notes.  Pertinent labs & imaging results that were available during my care of the patient were reviewed by me and considered in my medical  decision making (see chart for details).  Clinical Course   8:10 AM Patient seen and examined. Patient is a 70 year old with multiple medical problems, on Coumadin. Patient had a mechanical fall after tripping in the parking lot. He did not hit his head or had loss of consciousness. He denies any headache. He is complaining of pain and swelling to the left knee. There is a large contusion to the left knee. Will check INR. Will get x-rays of the knee.  10:00 AM Xray read as negative. I reviewed xray myself and can see some irregularity to the tibial platau, highly suspicious for fracture. Given amount of pain and swelling, will get CT of the knee joint.  1:53 PM CT showed nondisplaced tibial plateau fracture. I did speak with orthopedic surgeon on call, Dr. Alvan Dame, who reviewed CT scan. He advised the patient does not require surgery. She said that we could aspirate the knee to relieve some pressure and provide relief with pain. However this was optional. He advised Ace wrap and immobilizer, crutches, patient is nonweightbearing. I discussed aspiration of the joint with patient. He was very hesitant. Given he is on Coumadin, will hold off on aspirating at this time unless swelling is significantly worse. At this time compartment are still soft, dorsal pedal pulse intact, no numbness or pain to his foot. Ace wrap and immobilizer ordered. We will try to ambulate him with crutches. If patient unable to safely ambulate with crutches he will need medical admission.  3:32 PM Patient was unable to ambulate at all with crutches. Will admit for further treatment. Ace wrap and immobilizer applied on the knee. Compartments continue to be soft. Dorsal pedal pulses intact.  4:05 PM Spoke with internal medicine teaching service. Will admit. Holding orders placed.    Final Clinical Impressions(s) / ED Diagnoses   Final diagnoses:  Tibial plateau fracture, left, closed, initial encounter    New  Prescriptions New Prescriptions   No medications on file     Jeannett Senior, PA-C 01/18/16 [place], MD 01/18/16 [phone]

## 2016-01-18 NOTE — H&P (Signed)
Date: 01/18/2016               Patient Name:  Dylan Morgan MRN: [id number]  DOB: Oct 13, 1945 Age / Sex: 70 y.o., male   PCP: Aldine Contes, MD         Medical Service: Internal Medicine Teaching Service         Attending Physician: Dr. Aldine Contes, MD    First Contact: Dr. Ophelia Shoulder Pager: [phone]  Second Contact: Dr. Maryellen Pile Pager: [phone]       After Hours (After 5p/  First Contact Pager: [phone]  weekends / holidays): Second Contact Pager: [phone]   Chief Complaint: Leg pain  History of Present Illness: Dylan Morgan is a 70 year old male with a history of prior CVA, coronary artery disease, hypertension, AAA who presented to the [facility] emergency department following a fall. The patient states that he was walking out of a restaurant after eating breakfast in the dark this morning he did not notice a parking bump. He then tripped over this parking bump and fell landing on his left knee. He did not hit his head or lose consciousness. He denied pain to his head, neck or back. Following this fall the patient drove home and then had difficulty with ambulation and severe left knee pain. He then drove himself to the emergency department because of the pain in his leg was worsening. On review of systems the patient denied feeling this of dizziness or lightheadedness and stated that the fall was mechanical secondary to tripping on a parking bump. He denies headache or changes in vision. He denies nausea, vomiting or abdominal pain. He denies cough or increased shortness of breath or chest pain. He denies diarrhea, constipation, fevers or chills. He denies being around sick contacts.  In the present department the patient was afebrile and hemodynamically stable. BMP was significant for creatinine of 1.45 which is the patient's baseline. CBC was unremarkable. INR was 2.44. CT of the left knee showed left lateral tibial plateau fracture without significant depression. Moderate  size joint effusion. Orthopedic surgery was consulted and the injury was not deemed surgical. He was placed in an immobilizer and will need rehabilitation. He was then admitted to the internal medicine teaching service for pain management and physical therapy evaluation.  Meds:  Current Meds  Medication Sig  . aspirin EC 81 MG tablet Take 81 mg by mouth every other day. After supper  . DULoxetine (CYMBALTA) 30 MG capsule Take 30 mg by mouth daily.  Marland Kitchen gabapentin (NEURONTIN) 100 MG capsule Take 100 mg by mouth daily.  . Multiple Vitamin (MULTIVITAMIN) tablet Take 1 tablet by mouth daily.  . predniSONE (DELTASONE) 20 MG tablet Take 1 tablet (20 mg total) by mouth daily.  . pregabalin (LYRICA) 50 MG capsule Take 1 capsule (50 mg total) by mouth 3 (three) times daily.  . simvastatin (ZOCOR) 40 MG tablet TAKE ONE TABLET BY MOUTH ONCE DAILY AFTER  SUPPER  . vitamin B-12 (CYANOCOBALAMIN) 1000 MCG tablet Take 1,000 mcg by mouth daily.  Marland Kitchen warfarin (COUMADIN) 5 MG tablet Take as directed by coumadin clinic (Patient taking differently: Take 5 mg by mouth daily. Take as directed by coumadin clinic)     Allergies: Allergies as of 01/18/2016 - Review Complete 01/18/2016  Allergen Reaction Noted  . Oxycodone-acetaminophen Other (See Comments)   . Diltiazem hcl Itching    Past Medical History:  Diagnosis Date  . AAA (abdominal aortic aneurysm) ([place])  a. 2010 s/p stenting  . Arthritis   . Bradycardia   . CAD (coronary artery disease) CABG in 2004   a. 2004 s/p CABG  . Carotid artery disease ([place])    a. 2012 dopplers with old LICA occlusion , RICA no significant abnormality  . CVA (cerebral infarction)    a. 2013 R Carona radiata stroke   . GERD (gastroesophageal reflux disease)   . Hyperlipidemia   . Hypertension   . Paroxysmal atrial fibrillation ([place]) 02/22/2009   a. on Coumadin   . Prediabetes   . Renal insufficiency   . Seborrheic dermatitis of scalp   . Tobacco abuse     Family  History:  Family History  Problem Relation Age of Onset  . Diabetes Mother   . Stroke Father   . Thyroid cancer Sister   . Heart disease Brother     Coronary artery disease  . Heart attack Brother   . Heart attack Brother      Social History:  Social History   Social History  . Marital status: Single    Spouse name: N/A  . Number of children: 4  . Years of education: 8   Occupational History  . Retired    Social History Main Topics  . Smoking status: Former Smoker    Packs/day: 0.00    Years: 35.00    Types: Cigarettes    Quit date: 02/03/2010  . Smokeless tobacco: Never Used  . Alcohol use No  . Drug use: No  . Sexual activity: Not on file   Other Topics Concern  . Not on file   Social History Narrative   Lives with son and his significant other, denies being married. Has 3 daughters & a son.  Several grandchildren.    Divorced.   Regular exercise.   Prior to retirement, hung dry wall for a living.    Right-handed   Caffeine: occasional soft drink     Review of Systems: A complete ROS was negative except as per HPI.   Physical Exam: Blood pressure 145/88, pulse (!) 47, temperature 97.4 F (36.3 C), resp. rate 14, SpO2 95 %. Physical Exam  Constitutional: He is oriented to person, place, and time. He appears well-developed and well-nourished.  Obese male  HENT:  Head: Normocephalic and atraumatic.  Cardiovascular: Normal rate and regular rhythm.  Exam reveals no gallop and no friction rub.   No murmur heard. Respiratory: Effort normal and breath sounds normal.  GI: Soft. Bowel sounds are normal. He exhibits distension.  Distended abdomen, no pain to palpation, bowel sounds present  Musculoskeletal: He exhibits no edema.  Left knee immobilized. Patient able to move his toes and feet bilaterally. Sensation intact in his lower extremities bilaterally.  Neurological: He is alert and oriented to person, place, and time.     EKG: Not obtained  CXR:  Not obtained  Assessment & Plan by Problem: 1. Tibial plateau fracture Patient has a nondisplaced left tibial plateau fracture following a mechanical injury. This injury is not operative. He had no predisposing medical issues that would lead to a fall. He denied dizziness or lightheadedness. -- Pain control, oxycodone immediate release 5 mg every 4 hours as needed -- Physical therapy consult  2. Paroxysmal atrial fibrillation INR to admission 2.44 -- Warfarin 5 mg once daily  3. Peripheral neuropathy -- Gabapentin 100 mg -- Pregabalin 50 mg -- Duloxetine 30 mg once daily  4. Coronary artery disease -- Simvastatin 40 mg once daily --  Aspirin 81 mg once daily   DVT/PE prophylaxis: Therapeutically anticoagulated for paroxysmal atrial fibrillation FEN/GI: Normal diet  Dispo: Admit patient to Observation with expected length of stay less than 2 midnights.  Signed: Ophelia Shoulder, MD 01/18/2016, 6:37 PM  Pager: [phone]

## 2016-01-18 NOTE — ED Triage Notes (Signed)
Patient states tripped in a parking lot landing on his L knee.   Patient complains of pain and swelling.   Patient did drive himself to the hospital.    Patient denies other complaints.

## 2016-01-18 NOTE — ED Notes (Signed)
Patient transported to X-ray 

## 2016-01-19 DIAGNOSIS — S82145A Nondisplaced bicondylar fracture of left tibia, initial encounter for closed fracture: Secondary | ICD-10-CM | POA: Diagnosis not present

## 2016-01-19 DIAGNOSIS — Z79899 Other long term (current) drug therapy: Secondary | ICD-10-CM

## 2016-01-19 DIAGNOSIS — I480 Paroxysmal atrial fibrillation: Secondary | ICD-10-CM

## 2016-01-19 DIAGNOSIS — M25462 Effusion, left knee: Secondary | ICD-10-CM | POA: Diagnosis not present

## 2016-01-19 DIAGNOSIS — Z7901 Long term (current) use of anticoagulants: Secondary | ICD-10-CM

## 2016-01-19 DIAGNOSIS — I2510 Atherosclerotic heart disease of native coronary artery without angina pectoris: Secondary | ICD-10-CM

## 2016-01-19 DIAGNOSIS — Z7982 Long term (current) use of aspirin: Secondary | ICD-10-CM

## 2016-01-19 DIAGNOSIS — Y92481 Parking lot as the place of occurrence of the external cause: Secondary | ICD-10-CM

## 2016-01-19 DIAGNOSIS — W01198A Fall on same level from slipping, tripping and stumbling with subsequent striking against other object, initial encounter: Secondary | ICD-10-CM

## 2016-01-19 DIAGNOSIS — G629 Polyneuropathy, unspecified: Secondary | ICD-10-CM

## 2016-01-19 LAB — PROTIME-INR
INR: 2.54
PROTHROMBIN TIME: 27.8 s — AB (ref 11.4–15.2)

## 2016-01-19 MED ORDER — DIPHENHYDRAMINE HCL 25 MG PO CAPS
25.0000 mg | ORAL_CAPSULE | Freq: Once | ORAL | Status: AC
  Administered 2016-01-19: 25 mg via ORAL
  Filled 2016-01-19: qty 1

## 2016-01-19 MED ORDER — PNEUMOCOCCAL VAC POLYVALENT 25 MCG/0.5ML IJ INJ
0.5000 mL | INJECTION | INTRAMUSCULAR | Status: AC
  Administered 2016-01-20: 0.5 mL via INTRAMUSCULAR
  Filled 2016-01-19: qty 0.5

## 2016-01-19 MED ORDER — HYDROMORPHONE HCL 2 MG/ML IJ SOLN: 0.5000 mg | INTRAMUSCULAR | Status: DC | PRN

## 2016-01-19 MED ORDER — HYDROMORPHONE HCL 2 MG PO TABS
1.0000 mg | ORAL_TABLET | Freq: Once | ORAL | Status: AC
  Administered 2016-01-19: 1 mg via ORAL
  Filled 2016-01-19: qty 1

## 2016-01-19 MED ORDER — PHYTONADIONE 5 MG PO TABS
5.0000 mg | ORAL_TABLET | Freq: Once | ORAL | Status: AC
  Administered 2016-01-19: 5 mg via ORAL
  Filled 2016-01-19: qty 1

## 2016-01-19 MED ORDER — WARFARIN SODIUM 5 MG PO TABS: 5.0000 mg | ORAL_TABLET | Freq: Every day | ORAL | Status: DC

## 2016-01-19 MED ORDER — MORPHINE SULFATE (PF) 2 MG/ML IV SOLN
2.0000 mg | Freq: Once | INTRAVENOUS | Status: AC
  Administered 2016-01-19: 2 mg via INTRAVENOUS
  Filled 2016-01-19: qty 1

## 2016-01-19 MED ORDER — ACETAMINOPHEN 325 MG PO TABS
325.0000 mg | ORAL_TABLET | Freq: Once | ORAL | Status: AC
  Administered 2016-01-19: 325 mg via ORAL
  Filled 2016-01-19: qty 1

## 2016-01-19 MED ORDER — PHYTONADIONE 5 MG PO TABS: 2.5000 mg | ORAL_TABLET | Freq: Once | ORAL | Status: DC

## 2016-01-19 NOTE — Consult Note (Signed)
Reason for Consult: left tibial plateau fracture Referring Physician:  Dareen Piano, MD  Dylan Morgan is an 70 y.o. male.  HPI: 70 year old male with a history of prior CVA, coronary artery disease, hypertension, AAA who presented to the [facility] emergency department following a fall. The patient states that he was walking out of a restaurant after eating breakfast in the dark this morning he did not notice a parking bump. He then tripped over this parking bump and fell landing on his left knee. Xray revealed L tibial plateau fx.   While in the ER there was discussion about disposition.  He was eventually admitted due to instability with required NWB on the left lower extremity.  Seen in room this evening with his daughter in law.  Other than pain from injury no other Orthopaedic complaints   Past Medical History:  Diagnosis Date  . AAA (abdominal aortic aneurysm) ([place])    a. 2010 s/p stenting  . Arthritis   . Bradycardia   . CAD (coronary artery disease) CABG in 2004   a. 2004 s/p CABG  . Carotid artery disease ([place])    a. 2012 dopplers with old LICA occlusion , RICA no significant abnormality  . CVA (cerebral infarction)    a. 2013 R Carona radiata stroke   . GERD (gastroesophageal reflux disease)   . Hyperlipidemia   . Hypertension   . Paroxysmal atrial fibrillation ([place]) 02/22/2009   a. on Coumadin   . Prediabetes   . Renal insufficiency   . Seborrheic dermatitis of scalp   . Tobacco abuse     Past Surgical History:  Procedure Laterality Date  . ABDOMINAL AORTIC ANEURYSM REPAIR  2010   Aortic stent repair  . CORONARY ARTERY BYPASS GRAFT  2004  . ROTATOR CUFF REPAIR  1990    Family History  Problem Relation Age of Onset  . Diabetes Mother   . Stroke Father   . Thyroid cancer Sister   . Heart disease Brother     Coronary artery disease  . Heart attack Brother   . Heart attack Brother     Social History:  reports that he quit smoking about 5 years ago. His smoking  use included Cigarettes. He smoked 0.00 packs per day for 35.00 years. He has never used smokeless tobacco. He reports that he does not drink alcohol or use drugs.  Allergies:  Allergies  Allergen Reactions  . Diltiazem Hcl Itching    Medications:  I have reviewed the patient's current medications. Scheduled: . aspirin EC  81 mg Oral Q48H  . DULoxetine  30 mg Oral Daily  . gabapentin  100 mg Oral Daily  . multivitamin with minerals  1 tablet Oral Daily  . [START ON 01/20/2016] pneumococcal 23 valent vaccine  0.5 mL Intramuscular Tomorrow-1000  . pregabalin  50 mg Oral TID  . simvastatin  40 mg Oral q1800  . vitamin B-12  1,000 mcg Oral Daily    Results for orders placed or performed during the hospital encounter of 01/18/16 (from the past 24 hour(s))  Urinalysis, Routine w reflex microscopic (not at [facility])     Status: Abnormal   Collection Time: 01/18/16  8:39 PM  Result Value Ref Range   Color, Urine YELLOW YELLOW   APPearance CLEAR CLEAR   Specific Gravity, Urine 1.020 1.005 - 1.030   pH 5.5 5.0 - 8.0   Glucose, UA NEGATIVE NEGATIVE mg/dL   Hgb urine dipstick NEGATIVE NEGATIVE   Bilirubin Urine NEGATIVE NEGATIVE  Ketones, ur NEGATIVE NEGATIVE mg/dL   Protein, ur NEGATIVE NEGATIVE mg/dL   Nitrite POSITIVE (A) NEGATIVE   Leukocytes, UA SMALL (A) NEGATIVE  Urine microscopic-add on     Status: Abnormal   Collection Time: 01/18/16  8:39 PM  Result Value Ref Range   Squamous Epithelial / LPF 0-5 (A) NONE SEEN   WBC, UA 6-30 0 - 5 WBC/hpf   RBC / HPF 0-5 0 - 5 RBC/hpf   Bacteria, UA MANY (A) NONE SEEN  Protime-INR     Status: Abnormal   Collection Time: 01/19/16  6:09 AM  Result Value Ref Range   Prothrombin Time 27.8 (H) 11.4 - 15.2 seconds   INR 2.54     X-ray:  CLINICAL DATA:  Fall, knee pain  EXAM: LEFT KNEE - COMPLETE 4+ VIEW  COMPARISON:  None.  FINDINGS: Mild chondrocalcinosis in the medial lateral joint. No significant joint space narrowing. Mild  lateral joint margin spurring.  Advanced degenerative change in the patellofemoral joint. Calcified loose bodies in the suprapatellar bursa. Small associated joint effusion  Negative for fracture  IMPRESSION: Severe degenerative change in the patellofemoral joint with calcified loose bodies in the suprapatellar bursa. No acute fracture.   Electronically Signed   By: Franchot Gallo M.D.   CLINICAL DATA:  Status post fall.  Severe pain.  EXAM: CT OF THE LEFT KNEE WITHOUT CONTRAST  TECHNIQUE: Multidetector CT imaging of the LEFT knee was performed according to the standard protocol. Multiplanar CT image reconstructions were also generated.  COMPARISON:  None.  FINDINGS: Bones/Joint/Cartilage  Generalized osteopenia.  Left lateral tibial plateau fracture extending to the lateral margin of the lateral tibial spine and involving the periphery of the anteromedial lateral tibial plateau. No significant depression.  No medial tibial plateau fracture. No other fracture or dislocation.  Large joint effusion.  No Baker cyst.  Normal alignment.  Severe osteoarthritis of the lateral patellofemoral compartment. Chondrocalcinosis of the medial and lateral femorotibial compartments as can be seen with CPPD. Dystrophic calcification in the patellofemoral compartment extending into the suprapatellar joint space concerning for calcified synovium.  Ligaments  Suboptimally assessed by CT.  ACL and PCL are grossly intact.  Muscles and Tendons  Muscles are normal. No significant muscle atrophy. No intramuscular fluid collection or hematoma. Intact quadriceps and patellar tendon.  Soft tissues  No fluid collection or hematoma. Peripheral vascular atherosclerotic disease.  IMPRESSION: 1. Left lateral tibial plateau fracture without significant depression. Moderate-sized joint effusion. 2. Severe osteoarthritis of the lateral patellofemoral  compartment.   Electronically Signed   By: Kathreen Devoid  ROS  Review of Systems: A complete ROS was negative except as per HPI.    Blood pressure 138/65, pulse 74, temperature 99.7 F (37.6 C), temperature source Oral, resp. rate 20, height '5\' 11"'$  (1.803 m), weight 106 kg (233 lb 9.6 oz), SpO2 95 %.  Physical Exam  Physical Exam  Constitutional: He is oriented to person, place, and time. He appears well-developed and well-nourished.  Obese male  HENT:  Head: Normocephalic and atraumatic.  Cardiovascular: Normal rate and regular rhythm.  Exam reveals no gallop and no friction rub.   No murmur heard. Respiratory: Effort normal and breath sounds normal.  GI: Soft. Bowel sounds are normal. He exhibits distension.  Distended abdomen, no pain to palpation, bowel sounds present  Musculoskeletal: He exhibits no edema.   Sensation intact in his lower extremities bilaterally.  Neurological: He is alert and oriented to person, place, and time.  Left knee immobilized. Patient able to move his toes and feet bilaterally. Left calf soft, tenderness about knee worse laterally   Assessment/Plan: Non displaced left lateral tibial split tibial plateau fracture.  Significant medical co-morbidities  Plan: I feel that this could be at least attempted to be treated non-operatively for now. Must be non-weight bearing for 8 weeks ROM activity encouraged and permitted RTC to see me in 4 weeks Probably will need SNF until strong enough to go home with family  Mauri Pole 01/19/2016, 8:26 PM

## 2016-01-19 NOTE — Progress Notes (Signed)
Skin assessed on back,  No wounds noted.

## 2016-01-19 NOTE — Evaluation (Signed)
Physical Therapy Evaluation Patient Details Name: Dylan Morgan MRN: [id number] DOB: 02/01/1946 Today's Date: 01/19/2016   History of Present Illness  70 year old male with a history of prior CVA, coronary artery disease, hypertension, AAA who presented to the [facility] emergency department following a fall. The patient states that he was walking out of a restaurant after eating breakfast in the dark this morning he did not notice a parking bump. He then tripped over this parking bump and fell landing on his left knee. Xray revealed L tibial plateau fx.    Clinical Impression  Pt admitted with above diagnosis. Pt currently with functional limitations due to the deficits listed below (see PT Problem List). On eval, pt required mod assist bed mobility. He was unable to progress beyond EOB due to pain and was assisted back to supine position. When ready to proceed with transfers and gait, he will need a RW. He is NWB LLE. Pt received morphine during assessment.  Pt will benefit from skilled PT to increase their independence and safety with mobility to allow discharge to the venue listed below.  He will benefit from [facility] SNF stay for further therapy prior to d/c home.     Follow Up Recommendations SNF;Supervision for mobility/OOB    Equipment Recommendations  Rolling walker with 5" wheels    Recommendations for Other Services       Precautions / Restrictions Precautions Precautions: Fall Required Braces or Orthoses: Knee Immobilizer - Left Knee Immobilizer - Left: On at all times Restrictions Weight Bearing Restrictions: Yes LLE Weight Bearing: Non weight bearing      Mobility  Bed Mobility Overal bed mobility: Needs Assistance Bed Mobility: Rolling;Supine to Sit;Sit to Supine Rolling: Min assist   Supine to sit: HOB elevated;Mod assist Sit to supine: Mod assist   General bed mobility comments: +rail, assist with LLE and to elevate trunk. Increased time needed to complete due to  pain. Pt unable to tolerate LLE in dependent position requiring immediate return to supine postion from  sitting EOB.  Transfers                 General transfer comment: unable due to pain  Ambulation/Gait             General Gait Details: unable due to pain  Stairs            Wheelchair Mobility    Modified Rankin (Stroke Patients Only)       Balance                                             Pertinent Vitals/Pain Pain Assessment: 0-10 Pain Score: 9  Pain Location: LLE Pain Descriptors / Indicators: Sharp;Aching Pain Intervention(s): Monitored during session;Limited activity within patient's tolerance;RN gave pain meds during session    [place] expects to be discharged to:: Private residence Living Arrangements: Children Available Help at Discharge: Family;Available PRN/intermittently Type of Home: House Home Access: Stairs to enter Entrance Stairs-Rails: Psychiatric nurse of Steps: 4 Home Layout: Two level;Bed/bath upstairs Home Equipment: Cane - single point      Prior Function Level of Independence: Independent         Comments: Drives. Active in the community.     Hand Dominance   Dominant Hand: Right    Extremity/Trunk Assessment   Upper Extremity Assessment: Defer to OT evaluation  Lower Extremity Assessment: LLE deficits/detail;RLE deficits/detail RLE Deficits / Details: Pt reports numbness in feet. LLE Deficits / Details: Pt reports numbness in feet.     Communication   Communication: No difficulties  Cognition Arousal/Alertness: Awake/alert Behavior During Therapy: WFL for tasks assessed/performed Overall Cognitive Status: Within Functional Limits for tasks assessed                      General Comments      Exercises General Exercises - Lower Extremity Ankle Circles/Pumps: AROM;Both;10 reps;Supine   Assessment/Plan    PT Assessment  Patient needs continued PT services  PT Problem List Decreased activity tolerance;Decreased balance;Decreased mobility;Decreased knowledge of precautions;Pain;Decreased knowledge of use of DME          PT Treatment Interventions DME instruction;Gait training;Functional mobility training;Balance training;Therapeutic exercise;Patient/family education;Therapeutic activities    PT Goals (Current goals can be found in the Care Plan section)  Acute Rehab PT Goals Patient Stated Goal: decrease pain PT Goal Formulation: With patient Time For Goal Achievement: 02/02/16 Potential to Achieve Goals: Good    Frequency Min 5X/week   Barriers to discharge Inaccessible home environment      Co-evaluation               End of Session Equipment Utilized During Treatment: Left knee immobilizer Activity Tolerance: Patient limited by pain Patient left: in bed;with bed alarm set;with call bell/phone within reach Nurse Communication: Mobility status         Time: 7741-2878 PT Time Calculation (min) (ACUTE ONLY): 14 min   Charges:   PT Evaluation $PT Eval Moderate Complexity: 1 Procedure     PT G CodesLorriane Shire 01/19/2016, 9:46 AM

## 2016-01-19 NOTE — Progress Notes (Signed)
   Subjective: No acute events overnight. However, this morning the patient complained of 10/10 pain in his left knee. He another complaints.  Objective:  Vital signs in last 24 hours: Vitals:   01/18/16 1630 01/18/16 1841 01/18/16 2143 01/19/16 0433  BP: 145/88 (!) 149/94 (!) 147/79 104/62  Pulse: (!) 47 (!) 55 (!) 50 (!) 49  Resp: '14 20 20 20  '$ Temp:  97.9 F (36.6 C)  97.5 F (36.4 C)  TempSrc:  Oral  Oral  SpO2: 95% 97% 93% 93%  Weight:  233 lb 9.6 oz (106 kg)    Height:  '5\' 11"'$  (1.803 m)     Physical Exam  Constitutional: He is oriented to person, place, and time. He appears well-developed and well-nourished.  HENT:  Head: Normocephalic and atraumatic.  Cardiovascular: Normal rate and regular rhythm.  Exam reveals no gallop and no friction rub.   No murmur heard. Respiratory: Effort normal and breath sounds normal. No respiratory distress. He has no wheezes.  GI: Soft. Bowel sounds are normal. He exhibits distension.  No pain to palpation  Musculoskeletal: He exhibits tenderness.  Extremely tender to palpation over the left tibial plateau. Pain with movement. Left knee joint appears extremely swollen with effusion.  Neurological: He is alert and oriented to person, place, and time.     Assessment/Plan: In summary, patient had increased left knee pain this morning. On physical examination there is swelling of the left knee with an effusion of the joint space. He will benefit from left knee arthrocentesis.   1. Tibial plateau fracture Patient has a nondisplaced left tibial plateau fracture following a mechanical injury. This injury is not operative. He had no predisposing medical issues that would lead to a fall. He denied dizziness or lightheadedness. This morning the patient had increased left knee pain. On examination the joint is extremely swollen and effusion is present. He will benefit from left knee arthrocentesis. -- Pain control, oxycodone immediate release 5 mg  every 4 hours as needed -- Physical therapy consult -- Left knee arthrocentesis  2. Paroxysmal atrial fibrillation INR to admission 2.44 -- Warfarin 5 mg once daily  3. Peripheral neuropathy -- Gabapentin 100 mg -- Pregabalin 50 mg -- Duloxetine 30 mg once daily  4. Coronary artery disease -- Simvastatin 40 mg once daily -- Aspirin 81 mg once daily  Dispo: Anticipated discharge pending physical therapy evaluation and placement.   Ophelia Shoulder, MD 01/19/2016, 10:42 AM Pager: [phone]

## 2016-01-19 NOTE — NC FL2 (Signed)
[place] LEVEL OF CARE SCREENING TOOL     IDENTIFICATION  Patient Name: Dylan Morgan Birthdate: [birth year] Sex: male Admission Date (Current Location): 01/18/2016  [facility] and [place] Number:  Herbalist and Address:  The [place], [place] [address], [place], [place] [address]      Provider Number: [id number]  Attending Physician Name and Address:  Aldine Contes, MD  Relative Name and Phone Number:  Kenson Groh, [phone]    Current Level of Care: Hospital Recommended Level of Care: [place] Prior Approval Number:    Date Approved/Denied:   PASRR Number: [id number]  Discharge Plan: SNF    Current Diagnoses: Patient Active Problem List   Diagnosis Date Noted  . Tibial plateau fracture, left, closed, initial encounter 01/18/2016  . Carpal tunnel syndrome, bilateral 01/11/2016  . Chronic cough 06/01/2015  . S/P AAA (abdominal aortic aneurysm) repair 06/21/2014  . Peripheral neuropathy ([place]) 01/16/2014  . Onychomycosis 01/10/2013  . Chronic anticoagulation 05/01/2012  . Neck pain, chronic 03/13/2012  . H/O TIA (transient ischemic attack) and stroke 01/02/2012  . Occlusion and stenosis of carotid artery without mention of cerebral infarction 12/19/2011  . TIA (transient ischemic attack) 11/23/2011  . Preventative health care 09/20/2011  . CVA (cerebral infarction)   . Prediabetes   . Long term (current) use of anticoagulants 08/04/2011  . Carotid artery disease ([place])   . CAD (coronary artery disease)   . Hypertension   . Hyperlipidemia   . Ejection fraction   . Hx of CABG   . AAA (abdominal aortic aneurysm) ([place])   . Erectile dysfunction 02/06/2011  . Paroxysmal atrial fibrillation ([place]) 02/22/2009  . DEPRESSIVE DISORDER 02/04/2007  . SLEEP APNEA 02/04/2007  . GERD 02/20/2006  . CKD (chronic kidney disease), stage III 02/20/2006    Orientation RESPIRATION BLADDER Height & Weight     Self,  Time, Situation, Place  Normal Continent Weight: 106 kg (233 lb 9.6 oz) Height:  '5\' 11"'$  (180.3 cm)  BEHAVIORAL SYMPTOMS/MOOD NEUROLOGICAL BOWEL NUTRITION STATUS   (N/A)   Continent Diet (Please see DC Summary)  AMBULATORY STATUS COMMUNICATION OF NEEDS Skin   Extensive Assist Verbally Normal                       Personal Care Assistance Level of Assistance  Bathing, Feeding, Dressing Bathing Assistance: Maximum assistance Feeding assistance: Independent Dressing Assistance: Limited assistance     Functional Limitations Info             SPECIAL CARE FACTORS FREQUENCY  PT (By licensed PT)     PT Frequency: 5x/week              Contractures      Additional Factors Info  Code Status, Allergies, Psychotropic Code Status Info: Full Allergies Info: Oxycodone-acetaminophen, Diltiazem Hcl Psychotropic Info: Cymablta; Lyrica         Current Medications (01/19/2016):  This is the current hospital active medication list Current Facility-Administered Medications  Medication Dose Route Frequency Provider Last Rate Last Dose  . acetaminophen (TYLENOL) tablet 650 mg  650 mg Oral Q6H PRN Maryellen Pile, MD       Or  . acetaminophen (TYLENOL) suppository 650 mg  650 mg Rectal Q6H PRN Maryellen Pile, MD      . aspirin EC tablet 81 mg  81 mg Oral Q48H Maryellen Pile, MD      . DULoxetine (CYMBALTA) DR capsule 30 mg  30 mg Oral Daily Maryellen Pile, MD   30 mg at 01/19/16 0910  . gabapentin (NEURONTIN) capsule 100 mg  100 mg Oral Daily Maryellen Pile, MD   100 mg at 01/19/16 0910  . multivitamin with minerals tablet 1 tablet  1 tablet Oral Daily Maryellen Pile, MD   1 tablet at 01/19/16 0910  . oxyCODONE (Oxy IR/ROXICODONE) immediate release tablet 5 mg  5 mg Oral Q4H PRN Maryellen Pile, MD   5 mg at 01/19/16 0629  . [START ON 01/20/2016] pneumococcal 23 valent vaccine (PNU-IMMUNE) injection 0.5 mL  0.5 mL Intramuscular Tomorrow-1000 Nischal Narendra, MD      . pregabalin  (LYRICA) capsule 50 mg  50 mg Oral TID Maryellen Pile, MD   50 mg at 01/19/16 0910  . senna-docusate (Senokot-S) tablet 1 tablet  1 tablet Oral QHS PRN Maryellen Pile, MD      . simvastatin (ZOCOR) tablet 40 mg  40 mg Oral q1800 Maryellen Pile, MD   40 mg at 01/18/16 2139  . vitamin B-12 (CYANOCOBALAMIN) tablet 1,000 mcg  1,000 mcg Oral Daily Maryellen Pile, MD   1,000 mcg at 01/19/16 0910  . warfarin (COUMADIN) tablet 5 mg  5 mg Oral Daily Aldine Contes, MD      . Warfarin - Physician Dosing Inpatient   Does not apply [id number] Aldine Contes, MD         Discharge Medications: Please see discharge summary for a list of discharge medications.  Relevant Imaging Results:  Relevant Lab Results:   Additional Information SSN: [place], [place]

## 2016-01-19 NOTE — Progress Notes (Signed)
Aspiration tray ordered.

## 2016-01-19 NOTE — Progress Notes (Addendum)
Pt was in uncontrollable pain. Pt was given PRN Oxy IR without improvement. MD was notified, Dilaudid PO was ordered.Pain was unrelieved, and pt requested something to help him sleep. Benadryl and Tylenol were ordered and given. Will continue to order.   During the course of the night, Orthopedic Technician was contacted to come and assess the pt's immobilizer on his left knee.

## 2016-01-19 NOTE — Discharge Instructions (Signed)
NON-weight bearing LLE until further directed ROM of left knee advised and permitted Knee immobilizer for comfort only

## 2016-01-19 NOTE — Evaluation (Signed)
Occupational Therapy Evaluation Patient Details Name: Dylan Morgan MRN: [id number] DOB: [birth year] Today's Date: 01/19/2016    History of Present Illness 70 year old male with a history of prior CVA, coronary artery disease, hypertension, AAA who presented to the [facility] emergency department following a fall. The patient states that he was walking out of a restaurant after eating breakfast in the dark this morning he did not notice a parking bump. He then tripped over this parking bump and fell landing on his left knee. Xray revealed L tibial plateau fx.   Clinical Impression   Pt admitted with the above diagnosis and has the deficits listed below. Pt would benefit from cont OT to increase independence with basic adls and adl transfers so he can eventually d/c home with his children after rehab.  Pt does not have 24 hour S available at home so may benefit from SNF rehab prior to returning home to assist in mobility safety and adls.  Pt did not tolerate standing today due to pain.  Will attempt at next session.    Follow Up Recommendations  SNF;Supervision/Assistance - 24 hour    Equipment Recommendations  Tub/shower bench;3 in 1 bedside comode;Other (comment) (may go to SNF.)    Recommendations for Other Services       Precautions / Restrictions Precautions Precautions: Fall Required Braces or Orthoses: Knee Immobilizer - Left Knee Immobilizer - Left: On at all times Restrictions Weight Bearing Restrictions: Yes LLE Weight Bearing: Non weight bearing      Mobility Bed Mobility Overal bed mobility: Needs Assistance Bed Mobility: Rolling;Supine to Sit;Sit to Supine Rolling: Min assist   Supine to sit: Min assist;HOB elevated Sit to supine: Mod assist   General bed mobility comments: pt used rail and pushed self almost into full sitting.  Once pain started in L knee pt immediately laid back down without warning.   Transfers Overall transfer level: Needs assistance                General transfer comment: unable due to pain    Balance                                            ADL Overall ADL's : Needs assistance/impaired Eating/Feeding: Set up;Sitting;Bed level   Grooming: Wash/dry hands;Wash/dry face;Oral care;Sitting;Bed level   Upper Body Bathing: Set up;Sitting   Lower Body Bathing: Moderate assistance;Bed level   Upper Body Dressing : Set up;Sitting   Lower Body Dressing: Maximal assistance;Bed level                 General ADL Comments: Pt unable to stand and get out of bed. ADLS completed at bed level due to pain in L knee.     Vision Vision Assessment?:  (appears intact in R eye)   Perception     Praxis      Pertinent Vitals/Pain Pain Assessment: 0-10 Pain Score: 10-Worst pain ever Pain Location: L knee only with mobility. Pain Descriptors / Indicators: Aching;Sharp;Throbbing Pain Intervention(s): Limited activity within patient's tolerance;Premedicated before session;Repositioned;Monitored during session     Hand Dominance Right   Extremity/Trunk Assessment Upper Extremity Assessment Upper Extremity Assessment: Overall WFL for tasks assessed   Lower Extremity Assessment Lower Extremity Assessment: Defer to PT evaluation RLE Deficits / Details: Pt reports numbness in feet. LLE Deficits / Details: Pt reports numbness in feet. LLE: Unable  to fully assess due to pain;Unable to fully assess due to immobilization   Cervical / Trunk Assessment Cervical / Trunk Assessment: Normal   Communication Communication Communication: No difficulties   Cognition Arousal/Alertness: Awake/alert Behavior During Therapy: WFL for tasks assessed/performed Overall Cognitive Status: Within Functional Limits for tasks assessed                     General Comments       Exercises       Shoulder Instructions      Home Living Family/patient expects to be discharged to:: Private  residence Living Arrangements: Children Available Help at Discharge: Family;Available PRN/intermittently Type of Home: House Home Access: Stairs to enter [employer] of Steps: 4 Entrance Stairs-Rails: Right;Left Home Layout: Two level;Bed/bath upstairs Alternate Level Stairs-Number of Steps: 14 Alternate Level Stairs-Rails: Right Bathroom Shower/Tub: Tub/shower unit Shower/tub characteristics: Curtain Biochemist, clinical: Standard     Home Equipment: Cane - single point   Additional Comments: Pt can sleep on may floor if needed and full bath with tub shower available on main floor.      Prior Functioning/Environment Level of Independence: Independent        Comments: Drives. Active in the community.        OT Problem List: Decreased activity tolerance;Decreased knowledge of use of DME or AE;Decreased knowledge of precautions;Pain;Decreased range of motion   OT Treatment/Interventions: Self-care/ADL training;DME and/or AE instruction;Therapeutic activities    OT Goals(Current goals can be found in the care plan section) Acute Rehab OT Goals Patient Stated Goal: decrease pain OT Goal Formulation: With patient Time For Goal Achievement: 02/02/16 Potential to Achieve Goals: Good ADL Goals Pt Will Perform Grooming: with supervision;sitting Pt Will Perform Lower Body Bathing: with min assist;sit to/from stand Pt Will Perform Lower Body Dressing: with min assist;with adaptive equipment;sit to/from stand Pt Will Transfer to Toilet: with min assist;bedside commode;stand pivot transfer  OT Frequency: Min 2X/week   Barriers to D/C: Decreased caregiver support  Pt lives with children but they are not available 24/7.       Co-evaluation              End of Session Nurse Communication: Mobility status  Activity Tolerance: Patient limited by pain Patient left: in bed;with call bell/phone within reach;with bed alarm set   Time: 1209-1226 OT Time Calculation  (min): 17 min Charges:  OT General Charges $OT Visit: 1 Procedure OT Evaluation $OT Eval Moderate Complexity: 1 Procedure G-Codes:    Glenford Peers 2016-01-27, 12:37 PM  [phone]

## 2016-01-19 NOTE — Clinical Social Work Note (Signed)
Clinical Social Work Assessment  Patient Details  Name: Dylan Morgan MRN: [id number] Date of Birth: April 20, 1945  Date of referral:  01/19/16               Reason for consult:  Facility Placement                Permission sought to share information with:  Facility Sport and exercise psychologist, Family Supports Permission granted to share information::  Yes, Verbal Permission Granted  Name::        Agency::  SNFs  Relationship::  Son  Contact Information:     Housing/Transportation Living arrangements for the past 2 months:  Single Family Home Source of Information:  Patient Patient Interpreter Needed:  None Criminal Activity/Legal Involvement Pertinent to Current Situation/Hospitalization:  No - Comment as needed Significant Relationships:  Adult Children Lives with:  Adult Children Do you feel safe going back to the place where you live?  No Need for family participation in patient care:  No (Coment)  Care giving concerns:  CSW received consult for possible SNF placement at time of discharge. CSW met with patient regarding PT recommendation of SNF placement at time of discharge. Patient reports that he lives with his son, but his son is currently unable to care for patient at their home given patient's current physical needs and fall risk. Patient expressed understanding of PT recommendation and is agreeable to SNF placement at time of discharge. CSW to continue to follow and assist with discharge planning needs.   Social Worker assessment / plan:  CSW spoke with patient concerning possibility of rehab at [facility] before returning home.  Employment status:  Retired Nurse, adult PT Recommendations:  [place] / Referral to community resources:  [place]  Patient/Family's Response to care:  Patient recognizes need for rehab before returning home and is agreeable to a SNF in [place]. Patient reports understanding that  he has to go to a snf that is in network with his insurance.  Patient/Family's Understanding of and Emotional Response to Diagnosis, Current Treatment, and Prognosis:  Patient/family is realistic regarding therapy needs and expressed being hopeful for SNF placement. Patient expressed understanding of CSW role and discharge process. No questions/concerns about plan or treatment.    Emotional Assessment Appearance:  Appears stated age Attitude/Demeanor/Rapport:  Other (Appropriate) Affect (typically observed):  Accepting, Appropriate Orientation:  Oriented to Self, Oriented to Place, Oriented to  Time, Oriented to Situation Alcohol / Substance use:  Not Applicable Psych involvement (Current and /or in the community):  No (Comment)  Discharge Needs  Concerns to be addressed:  Care Coordination Readmission within the last 30 days:  No Current discharge risk:  None Barriers to Discharge:  Continued Medical Work up   [employer], [place] 01/19/2016, 10:35 AM

## 2016-01-20 DIAGNOSIS — I10 Essential (primary) hypertension: Secondary | ICD-10-CM | POA: Diagnosis not present

## 2016-01-20 DIAGNOSIS — M25462 Effusion, left knee: Secondary | ICD-10-CM | POA: Diagnosis not present

## 2016-01-20 DIAGNOSIS — S82102S Unspecified fracture of upper end of left tibia, sequela: Secondary | ICD-10-CM | POA: Diagnosis not present

## 2016-01-20 DIAGNOSIS — S82112A Displaced fracture of left tibial spine, initial encounter for closed fracture: Secondary | ICD-10-CM | POA: Diagnosis not present

## 2016-01-20 DIAGNOSIS — M549 Dorsalgia, unspecified: Secondary | ICD-10-CM | POA: Diagnosis not present

## 2016-01-20 DIAGNOSIS — N183 Chronic kidney disease, stage 3 (moderate): Secondary | ICD-10-CM | POA: Diagnosis not present

## 2016-01-20 DIAGNOSIS — R278 Other lack of coordination: Secondary | ICD-10-CM | POA: Diagnosis not present

## 2016-01-20 DIAGNOSIS — M542 Cervicalgia: Secondary | ICD-10-CM | POA: Diagnosis not present

## 2016-01-20 DIAGNOSIS — G5603 Carpal tunnel syndrome, bilateral upper limbs: Secondary | ICD-10-CM | POA: Diagnosis not present

## 2016-01-20 DIAGNOSIS — W01198A Fall on same level from slipping, tripping and stumbling with subsequent striking against other object, initial encounter: Secondary | ICD-10-CM | POA: Diagnosis not present

## 2016-01-20 DIAGNOSIS — M79642 Pain in left hand: Secondary | ICD-10-CM | POA: Diagnosis not present

## 2016-01-20 DIAGNOSIS — S82142S Displaced bicondylar fracture of left tibia, sequela: Secondary | ICD-10-CM | POA: Diagnosis not present

## 2016-01-20 DIAGNOSIS — M19131 Post-traumatic osteoarthritis, right wrist: Secondary | ICD-10-CM | POA: Diagnosis not present

## 2016-01-20 DIAGNOSIS — S82145A Nondisplaced bicondylar fracture of left tibia, initial encounter for closed fracture: Secondary | ICD-10-CM | POA: Diagnosis not present

## 2016-01-20 DIAGNOSIS — D508 Other iron deficiency anemias: Secondary | ICD-10-CM | POA: Diagnosis not present

## 2016-01-20 DIAGNOSIS — S82125D Nondisplaced fracture of lateral condyle of left tibia, subsequent encounter for closed fracture with routine healing: Secondary | ICD-10-CM | POA: Diagnosis not present

## 2016-01-20 DIAGNOSIS — M545 Low back pain: Secondary | ICD-10-CM | POA: Diagnosis not present

## 2016-01-20 DIAGNOSIS — R2681 Unsteadiness on feet: Secondary | ICD-10-CM | POA: Diagnosis not present

## 2016-01-20 DIAGNOSIS — M79641 Pain in right hand: Secondary | ICD-10-CM | POA: Diagnosis not present

## 2016-01-20 DIAGNOSIS — M47812 Spondylosis without myelopathy or radiculopathy, cervical region: Secondary | ICD-10-CM | POA: Diagnosis not present

## 2016-01-20 DIAGNOSIS — R69 Illness, unspecified: Secondary | ICD-10-CM | POA: Diagnosis not present

## 2016-01-20 DIAGNOSIS — I69328 Other speech and language deficits following cerebral infarction: Secondary | ICD-10-CM | POA: Diagnosis not present

## 2016-01-20 DIAGNOSIS — R208 Other disturbances of skin sensation: Secondary | ICD-10-CM | POA: Diagnosis not present

## 2016-01-20 DIAGNOSIS — G8929 Other chronic pain: Secondary | ICD-10-CM | POA: Diagnosis not present

## 2016-01-20 DIAGNOSIS — F3342 Major depressive disorder, recurrent, in full remission: Secondary | ICD-10-CM | POA: Diagnosis not present

## 2016-01-20 DIAGNOSIS — S82142A Displaced bicondylar fracture of left tibia, initial encounter for closed fracture: Secondary | ICD-10-CM | POA: Diagnosis not present

## 2016-01-20 DIAGNOSIS — M6281 Muscle weakness (generalized): Secondary | ICD-10-CM | POA: Diagnosis not present

## 2016-01-20 DIAGNOSIS — M19132 Post-traumatic osteoarthritis, left wrist: Secondary | ICD-10-CM | POA: Diagnosis not present

## 2016-01-20 DIAGNOSIS — S62002S Unspecified fracture of navicular [scaphoid] bone of left wrist, sequela: Secondary | ICD-10-CM | POA: Diagnosis not present

## 2016-01-20 DIAGNOSIS — I2510 Atherosclerotic heart disease of native coronary artery without angina pectoris: Secondary | ICD-10-CM | POA: Diagnosis not present

## 2016-01-20 DIAGNOSIS — I480 Paroxysmal atrial fibrillation: Secondary | ICD-10-CM | POA: Diagnosis not present

## 2016-01-20 DIAGNOSIS — G629 Polyneuropathy, unspecified: Secondary | ICD-10-CM | POA: Diagnosis not present

## 2016-01-20 DIAGNOSIS — M25562 Pain in left knee: Secondary | ICD-10-CM | POA: Diagnosis not present

## 2016-01-20 DIAGNOSIS — Y92481 Parking lot as the place of occurrence of the external cause: Secondary | ICD-10-CM | POA: Diagnosis not present

## 2016-01-20 DIAGNOSIS — Z5189 Encounter for other specified aftercare: Secondary | ICD-10-CM | POA: Diagnosis not present

## 2016-01-20 LAB — PROTIME-INR
INR: 1.72
PROTHROMBIN TIME: 20.4 s — AB (ref 11.4–15.2)

## 2016-01-20 MED ORDER — OXYCODONE HCL 10 MG PO TABS: 10.0000 mg | ORAL_TABLET | Freq: Four times a day (QID) | ORAL | 0 refills | Status: DC | PRN

## 2016-01-20 MED ORDER — ACETAMINOPHEN 325 MG PO TABS: 650.0000 mg | ORAL_TABLET | Freq: Four times a day (QID) | ORAL | 0 refills | Status: DC | PRN

## 2016-01-20 MED ORDER — ENOXAPARIN SODIUM 60 MG/0.6ML ~~LOC~~ SOLN
50.0000 mg | SUBCUTANEOUS | Status: DC
  Administered 2016-01-20: 50 mg via SUBCUTANEOUS
  Filled 2016-01-20: qty 0.6

## 2016-01-20 MED ORDER — WARFARIN - PHARMACIST DOSING INPATIENT: Freq: Every day | Status: DC

## 2016-01-20 MED ORDER — HYDROMORPHONE HCL 2 MG/ML IJ SOLN
0.5000 mg | Freq: Once | INTRAMUSCULAR | Status: AC
  Administered 2016-01-20: 0.5 mg via INTRAVENOUS
  Filled 2016-01-20: qty 1

## 2016-01-20 MED ORDER — LIDOCAINE HCL (CARDIAC) 20 MG/ML IV SOLN
INTRAVENOUS | Status: AC
  Filled 2016-01-20: qty 5

## 2016-01-20 MED ORDER — LIDOCAINE HCL 2 % IJ SOLN
5.0000 mL | Freq: Once | INTRAMUSCULAR | Status: DC
  Administered 2016-01-20: 100 mg
  Filled 2016-01-20: qty 10

## 2016-01-20 MED ORDER — IPRATROPIUM-ALBUTEROL 0.5-2.5 (3) MG/3ML IN SOLN: 3.0000 mL | Freq: Four times a day (QID) | RESPIRATORY_TRACT | Status: DC | PRN

## 2016-01-20 MED ORDER — LIDOCAINE HCL (PF) 2 % IJ SOLN
10.0000 mL | Freq: Once | INTRAMUSCULAR | Status: AC
Start: 2016-01-20 — End: 2016-01-20
  Filled 2016-01-20: qty 10

## 2016-01-20 MED ORDER — WARFARIN SODIUM 7.5 MG PO TABS
7.5000 mg | ORAL_TABLET | Freq: Once | ORAL | Status: AC
  Administered 2016-01-20: 7.5 mg via ORAL
  Filled 2016-01-20: qty 1

## 2016-01-20 NOTE — Discharge Summary (Signed)
Name: Dylan Morgan MRN: [id number] DOB: 10/05/1945 70 y.o. PCP: Aldine Contes, MD  Date of Admission: 01/18/2016  7:32 AM Date of Discharge: 01/20/2016 Attending Physician: Aldine Contes, MD  Discharge Diagnosis: Active Problems:   Tibial plateau fracture, left, closed, initial encounter   Discharge Medications:   Medication List    TAKE these medications   acetaminophen 325 MG tablet Commonly known as:  TYLENOL Take 2 tablets (650 mg total) by mouth every 6 (six) hours as needed for mild pain (or Fever >/= 101).   aspirin EC 81 MG tablet Take 81 mg by mouth every other day. After supper   DULoxetine 30 MG capsule Commonly known as:  CYMBALTA Take 30 mg by mouth daily.   gabapentin 100 MG capsule Commonly known as:  NEURONTIN Take 100 mg by mouth daily.   multivitamin tablet Take 1 tablet by mouth daily.   Oxycodone HCl 10 MG Tabs Take 1 tablet (10 mg total) by mouth every 6 (six) hours as needed for moderate pain or severe pain.   predniSONE 20 MG tablet Commonly known as:  DELTASONE Take 1 tablet (20 mg total) by mouth daily.   pregabalin 50 MG capsule Commonly known as:  LYRICA Take 1 capsule (50 mg total) by mouth 3 (three) times daily.   simvastatin 40 MG tablet Commonly known as:  ZOCOR TAKE ONE TABLET BY MOUTH ONCE DAILY AFTER  SUPPER   vitamin B-12 1000 MCG tablet Commonly known as:  CYANOCOBALAMIN Take 1,000 mcg by mouth daily.   warfarin 5 MG tablet Commonly known as:  COUMADIN Take as directed by coumadin clinic What changed:  how much to take  how to take this  when to take this  additional instructions       Disposition and follow-up:   DylanLenell L Morgan was discharged from [facility] in Stable condition.  At the hospital follow up visit please address:  1.  Tibial plateau fratcure - non-weight bearing x 8 weeks. Non surgical. Will need PT. 2. PAF - INR 1.7. Needs daily INR until reaches 2-3. No need  for bridging.   2.  Labs / imaging needed at time of follow-up: INR daily until 2-3, then 1 week  3.  Pending labs/ test needing follow-up: none  Follow-up Appointments:  Contact information for follow-up providers    Mauri Pole, MD Follow up in 4 week(s).   Specialty:  Orthopedic Surgery Contact information: [address] [place] [place] [employer] [address] [phone]            Contact information for after-discharge care    Destination    [facility] .   Specialty:  [place] information: [address] [place] Hospital Course by problem list:  Tibial plateau fracture, left, closed: Patient has a nondisplaced left tibial plateau fracture following a mechanical injury. He was evaluated by orthopedic surgery, Dr. Paralee Cancel, who did not feel that this would require surgical intervention at this time. He will be non-weight bearing for 8 weeks. Physical therapy recommending skilled nursing placement for further therapy. He had significant joint effusion requiring joint aspiration. His INR was reversed with vitamin K prior to arthrocentesis. 27 mL of serosanguinous fluid was drained from the joint with symptomatic relief. He was restarted on his home warfarin dose. Will need follow up INR until therapeutic  INR 2-3 again.   Paroxysmal atrial fibrillation: Reversed INR prior to arthrocentesis as above. INR of 1.72. Restarted home warfarin. Will need daily INRs until at goal.   Discharge Vitals:   BP (!) 141/74 (BP Location: Left Arm)   Pulse 60   Temp 97.4 F (36.3 C) (Oral)   Resp 18   Ht '5\' 11"'$  (1.803 m)   Wt 233 lb 9.6 oz (106 kg)   SpO2 93%   BMI 32.58 kg/m   Pertinent Labs, Studies, and Procedures:  CT with Left lateral tibial plateau fracture without significant depression. Moderate-sized joint effusion. S/P left knee therapeutic  arthrocentesis.  Discharge Instructions: Discharge Instructions    Diet - low sodium heart healthy    Complete by:  As directed    Increase activity slowly    Complete by:  As directed     Daily INR until 2-3. Follow up with Ortho in 4 weeks. Non-weight bearing x 8 weeks.   Signed: Maryellen Pile, MD 01/20/2016, 4:58 PM   Pager: [phone]

## 2016-01-20 NOTE — Progress Notes (Signed)
Patient will discharge to [facility] Anticipated discharge date: 10/19 Family notified: Pt dtr, Pharmacist, community by [employer]- scheduled for 5:45pm  CSW signing off.  Jorge Ny, LCSW Clinical Social Worker [phone]

## 2016-01-20 NOTE — Care Management Note (Signed)
Case Management Note  Patient Details  Name: Dylan Morgan MRN: [id number] Date of Birth: [birth year]  Subjective/Objective:                 Patient from home with children, will have knee fluid aspirated today. SNF approval pending, CSW following.    Action/Plan:  Anticipate SNF approval today and DC if tolerates procedure well.   Expected Discharge Date:                  Expected Discharge Plan:  [place]  In-House Referral:  NA  Discharge planning Services  CM Consult  Post Acute Care Choice:  NA Choice offered to:  NA  DME Arranged:  N/A DME Agency:  NA  HH Arranged:  NA HH Agency:  NA  Status of Service:  Completed, signed off  If discussed at [place] of Stay Meetings, dates discussed:    Additional Comments:  Carles Collet, RN 01/20/2016, 10:18 AM

## 2016-01-20 NOTE — Care Management CC44 (Signed)
Condition Code 44 Documentation Completed  Patient Details  Name: VALOR QUAINTANCE MRN: [id number] Date of Birth: Aug 26, 1945   Condition Code 44 given:  Yes Patient signature on Condition Code 44 notice:  Yes Documentation of 2 MD's agreement:  Yes Code 44 added to claim:  Yes    Carles Collet, RN 01/20/2016, 12:31 PM

## 2016-01-20 NOTE — Progress Notes (Addendum)
[place] for Warfarin Indication: atrial fibrillation  Allergies  Allergen Reactions  . Diltiazem Hcl Itching    Patient Measurements: Height: '5\' 11"'$  (180.3 cm) Weight: 233 lb 9.6 oz (106 kg) IBW/kg (Calculated) : 75.3  Vital Signs: Temp: 97.4 F (36.3 C) (10/19 0800) Temp Source: Oral (10/19 0800) BP: 141/74 (10/19 0800) Pulse Rate: 60 (10/19 0800)  Labs:  Recent Labs  01/18/16 0823 01/18/16 0827 01/19/16 0609 01/20/16 0449  HGB 13.9  --   --   --   HCT 42.0  --   --   --   PLT 169  --   --   --   LABPROT  --  27.0* 27.8* 20.4*  INR  --  2.44 2.54 1.72  CREATININE 1.45*  --   --   --     Estimated Creatinine Clearance: 57.1 mL/min (by C-G formula based on SCr of 1.45 mg/dL (H)).   Medical History: Past Medical History:  Diagnosis Date  . AAA (abdominal aortic aneurysm) ([place])    a. 2010 s/p stenting  . Arthritis   . Bradycardia   . CAD (coronary artery disease) CABG in 2004   a. 2004 s/p CABG  . Carotid artery disease ([place])    a. 2012 dopplers with old LICA occlusion , RICA no significant abnormality  . CVA (cerebral infarction)    a. 2013 R Carona radiata stroke   . GERD (gastroesophageal reflux disease)   . Hyperlipidemia   . Hypertension   . Paroxysmal atrial fibrillation ([place]) 02/22/2009   a. on Coumadin   . Prediabetes   . Renal insufficiency   . Seborrheic dermatitis of scalp   . Tobacco abuse     Assessment: 60 yoM on warfarin prior to admission for atrial fibrillation admitted 10/17 with L tibial plateau fracture.  Warfarin held last evening and vitamin K 5 mg given in anticipation of knee arthrocentesis that occurred earlier today. Current INR is 1.72 < 2.54 and CBC stable.  PTA warfarin dose is 5 mg daily per patient and 5 mg daily except 2.5 mg on Wednesday and Friday per clinic notes    Goal of Therapy:  INR 2-3 Monitor platelets by anticoagulation protocol: Yes   Plan:  1. Warfarin 7.5 mg  x 1 this evening (higher dose in light of recent vitamin K dose) 2. Daily INR while inpatient 3. Can likely resume home/prior to arrival dose of warfarin in next 24 - 48 hours  4. Discontinue Lovenox when INR therapeutic   Vincenza Hews, PharmD, BCPS 01/20/2016, 12:41 PM Pager: [phone]

## 2016-01-20 NOTE — Clinical Social Work Placement (Signed)
   CLINICAL SOCIAL WORK PLACEMENT  NOTE  Date:  01/20/2016  Patient Details  Name: Dylan Morgan MRN: [id number] Date of Birth: 11/11/1945  Clinical Social Work is seeking post-discharge placement for this patient at the [place] level of care (*CSW will initial, date and re-position this form in  chart as items are completed):      Patient/family provided with [place] Work Department's list of facilities offering this level of care within the geographic area requested by the patient (or if unable, by the patient's family).      Patient/family informed of their freedom to choose among providers that offer the needed level of care, that participate in Medicare, Medicaid or managed care program needed by the patient, have an available bed and are willing to accept the patient.      Patient/family informed of [facility]'s ownership interest in [facility] and [facility], as well as of the fact that they are under no obligation to receive care at these facilities.  PASRR submitted to EDS on 01/19/16     PASRR number received on 01/19/16     Existing PASRR number confirmed on       FL2 transmitted to all facilities in geographic area requested by pt/family on 01/19/16     FL2 transmitted to all facilities within larger geographic area on       Patient informed that his/her managed care company has contracts with or will negotiate with certain facilities, including the following:        Yes   Patient/family informed of bed offers received.  Patient [facility] bed at [facility]     Physician recommends and patient chooses bed at      Patient to be transferred to [facility] on 01/20/16.  Patient to be transferred to facility by [facility]     Patient family notified on 01/20/16 of transfer.  Name of family member notified:  Vicky     PHYSICIAN Please sign FL2     Additional Comment:     _______________________________________________ Jorge Ny, LCSW 01/20/2016, 5:04 PM

## 2016-01-20 NOTE — Procedures (Signed)
Knee Arthrocentesis without Injection Procedure Note  Diagnosis: left knee  Indications: Symptomatic relief of large effusion  Anesthesia: Lidocaine 2% without epinephrine  Procedure Details   Verbal and written consent was obtained for the procedure. The joint was prepped with alcohol swabs and a small wheel of 1 mL of 2% lidocaine was injected into the subcutaneous tissue. A 22 gauge needle was inserted into the medial aspect of the joint from a medial approach. 27 ml of serosanguinous fluid was removed from the joint and discarded. The needle was removed and the area cleansed and dressed.  Complications:  None; patient tolerated the procedure well.  Will resume Lovenox for DVT PPx and re-start home warfarin

## 2016-01-20 NOTE — Progress Notes (Signed)
   Subjective: No acute events overnight. Patient did complain of increased left knee pain this morning. He was given 0.5 mg IV hydromorphone with resolution of his symptoms. PT attempted to work with the patient yesterday but he was still unable to ambulate secondary to pain. The patient has good by mouth intake and has no additional questions this morning.  Objective:  Vital signs in last 24 hours: Vitals:   01/19/16 1413 01/19/16 2232 01/20/16 0532 01/20/16 0800  BP: 138/65 131/66 (!) 164/71 (!) 141/74  Pulse: 74 64 (!) 58 60  Resp: '20 16 18 18  '$ Temp: 99.7 F (37.6 C) 98.8 F (37.1 C) 98.6 F (37 C) 97.4 F (36.3 C)  TempSrc: Oral Oral Oral Oral  SpO2: 95% 92% 92% 93%  Weight:      Height:       Physical Exam  Constitutional: He is oriented to person, place, and time. He appears well-developed and well-nourished.  Cardiovascular: Normal rate and regular rhythm.  Exam reveals no friction rub.   No murmur heard. Respiratory: Effort normal. He has wheezes.  Diffuse wheezes bilaterally.  GI: Soft. Bowel sounds are normal. He exhibits distension. There is no tenderness.  Patient's abdomen appears distended on examination. There is no pain to palpation. Bowel sounds are normal. The patient states that his abdomen is generally like this.  Musculoskeletal:  Very swollen large left knee joint with effusion.  Neurological: He is alert and oriented to person, place, and time.     Assessment/Plan: In summary, patient continues to have left knee pain with worsening swelling and joint effusion. His warfarin dose was held yesterday. His INR this morning was 1.72. He will have left knee arthrocentesis performed by Dr. Charlynn Grimes this morning. Please see procedure note for details. Additionally, patient had increased wheezing and duo nebulizers were ordered. He will be medically clear for discharge pending placement.  1. Tibial plateau fracture Patient with continued left knee pain, swelling  and joint effusion. Coumadin held yesterday and his INR today was 1.72. He'll have left knee arthrocentesis performed today. We'll continue with pain control and the patient will be medically clear pending skilled nursing facility placement. -- Pain control, oxycodone immediate release 5 mg every 4 hours as needed -- Physical therapy consult -- Left knee arthrocentesis  2. Paroxysmal atrial fibrillation INR of 1.72. Warfarin was held secondary to left knee arthrocentesis. We will restart warfarin when appropriate time has passed following left knee arthrocentesis. -- Currently holding warfarin secondary to left knee arthrocentesis.  3. Peripheral neuropathy -- Gabapentin 100 mg -- Pregabalin 50 mg -- Duloxetine 30 mg once daily  4. Coronary artery disease -- Simvastatin 40 mg once daily -- Aspirin 81 mg once daily  DVT/PE prophylaxis: Start heparin after arthrocentesis FEN/GI: Renal diet  Dispo: Anticipated discharge pending placement.   Ophelia Shoulder, MD 01/20/2016, 11:07 AM Pager: [phone]

## 2016-01-20 NOTE — Progress Notes (Signed)
Physical Therapy Treatment Patient Details Name: Dylan Morgan MRN: [id number] DOB: [birth year] Today's Date: 01/20/2016    History of Present Illness 70 year old male with a history of prior CVA, coronary artery disease, hypertension, AAA who presented to the [facility] emergency department following a fall. The patient states that he was walking out of a restaurant after eating breakfast in the dark this morning he did not notice a parking bump. He then tripped over this parking bump and fell landing on his left knee. Xray revealed L tibial plateau fx.    PT Comments    Patient required +2 assist for all bed mobility and unable to transfer this session. Pt lethargic and with tangential speech. RN aware of change in pt's presentation compared to yesterday's noted therapy session. Continue to progress as tolerated with anticipated d/c to SNF for further skilled PT services.    Follow Up Recommendations  SNF;Supervision for mobility/OOB     Equipment Recommendations  Rolling walker with 5" wheels    Recommendations for Other Services       Precautions / Restrictions Precautions Precautions: Fall Restrictions Weight Bearing Restrictions: Yes LLE Weight Bearing: Non weight bearing    Mobility  Bed Mobility Overal bed mobility: Needs Assistance Bed Mobility: Rolling;Supine to Sit;Sit to Supine Rolling: Max assist   Supine to sit: Max assist;HOB elevated Sit to supine: Max assist;+2 for physical assistance   General bed mobility comments: max cues and assist for all aspects of bed mobility; hand over hand required for reaching and holding onto bed rails; RN assisted with return to supine; max cues for keeping eyes open and sitting EOB with pt falling backward at times and sitting midline no assist at times  Transfers                 General transfer comment: unable; attempted sit to stand and lateral scoot transfer with +2  Ambulation/Gait                  Stairs            Wheelchair Mobility    Modified Rankin (Stroke Patients Only)       Balance                                    Cognition Arousal/Alertness: Awake/alert Behavior During Therapy: WFL for tasks assessed/performed Overall Cognitive Status: Within Functional Limits for tasks assessed                      Exercises      General Comments General comments (skin integrity, edema, etc.): RN reported pt has been confused and lethargic all day; pt maintained closed eyes most of session and incontineent of urine upon arrival      Pertinent Vitals/Pain Pain Assessment: Faces Faces Pain Scale: Hurts little more Pain Location: L LE and R knee Pain Descriptors / Indicators: Aching;Sore (pt perseverated "I think I broke the other one too") Pain Intervention(s): Limited activity within patient's tolerance;Monitored during session;Premedicated before session;Repositioned    Home Living                      Prior Function            PT Goals (current goals can now be found in the care plan section) Acute Rehab PT Goals Patient Stated Goal: not able to state Progress towards  PT goals: Not progressing toward goals - comment    Frequency    Min 5X/week      PT Plan Current plan remains appropriate    Co-evaluation             End of Session Equipment Utilized During Treatment: Left knee immobilizer;Gait belt Activity Tolerance: Patient limited by lethargy;Other (comment) (confusion) Patient left: in bed;with bed alarm set;with call bell/phone within reach;with nursing/sitter in room     Time: 1840-3754 PT Time Calculation (min) (ACUTE ONLY): 26 min  Charges:  $Therapeutic Activity: 23-37 mins                    G Codes:      Salina April, PTA Pager: [phone]   01/20/2016, 4:29 PM

## 2016-01-24 ENCOUNTER — Encounter: Payer: Self-pay | Admitting: Internal Medicine

## 2016-01-24 ENCOUNTER — Non-Acute Institutional Stay (SKILLED_NURSING_FACILITY): Payer: Medicare HMO | Admitting: Internal Medicine

## 2016-01-24 DIAGNOSIS — G5603 Carpal tunnel syndrome, bilateral upper limbs: Secondary | ICD-10-CM

## 2016-01-24 DIAGNOSIS — N183 Chronic kidney disease, stage 3 unspecified: Secondary | ICD-10-CM

## 2016-01-24 DIAGNOSIS — I480 Paroxysmal atrial fibrillation: Secondary | ICD-10-CM

## 2016-01-24 DIAGNOSIS — I2510 Atherosclerotic heart disease of native coronary artery without angina pectoris: Secondary | ICD-10-CM | POA: Diagnosis not present

## 2016-01-24 DIAGNOSIS — S82142S Displaced bicondylar fracture of left tibia, sequela: Secondary | ICD-10-CM

## 2016-01-24 DIAGNOSIS — R2681 Unsteadiness on feet: Secondary | ICD-10-CM

## 2016-01-24 DIAGNOSIS — G629 Polyneuropathy, unspecified: Secondary | ICD-10-CM | POA: Diagnosis not present

## 2016-01-24 DIAGNOSIS — F329 Major depressive disorder, single episode, unspecified: Secondary | ICD-10-CM

## 2016-01-24 DIAGNOSIS — R69 Illness, unspecified: Secondary | ICD-10-CM | POA: Diagnosis not present

## 2016-01-24 NOTE — Progress Notes (Signed)
LOCATION: [facility]  PCP: Aldine Contes, MD   Code Status: Full Code  Goals of care: Advanced Directive information Advanced Directives 01/18/2016  Does patient have an advance directive? No  Type of Advance Directive -  Does patient want to make changes to advanced directive? -  Copy of advanced directive(s) in chart? -  Would patient like information on creating an advanced directive? No - patient declined information  Pre-existing out of facility DNR order (yellow form or pink MOST form) -       Extended Emergency Contact Information Primary Emergency Contact: Boehle,[name] Address: [place], [place] [address] [place] Phone: [phone] Relation: Relative Secondary Emergency Contact: [place] Mobile Phone: [phone] Relation: Son   Allergies  Allergen Reactions  . Diltiazem Hcl Itching    Chief Complaint  Patient presents with  . New Admit To SNF    New Admission Visit     HPI:  Patient is a 70 y.o. male seen today for short term rehabilitation post hospital admission from 01/18/16-01/20/16 with left tibial closed fracture post fall. He was seen by orthopedics and conservative management was recommended. He is NWB to LLE for 8 weeks. He underwent symptomatic relief of large left knee effusion with knee arthrocentesis in the hospital. he is seen in his room today. He has knee immobilizer in place  Review of Systems:  Constitutional: Negative for fever, chills, diaphoresis. Energy level is slowly coming back. HENT: Negative for headache, congestion, nasal discharge, difficulty swallowing.   Eyes: Negative for blurred vision Respiratory: Negative for cough, shortness of breath and wheezing.   Cardiovascular: Negative for chest pain, palpitations, leg swelling.  Gastrointestinal: Negative for heartburn, nausea, vomiting, abdominal pain. Last bowel movement was  yesterday. Genitourinary: Negative for dysuria Musculoskeletal: Negative for back pain, fall in the facility.  Skin: Negative for itching, rash.  Neurological: Negative for dizziness. Psychiatric/Behavioral: Negative for depression   Past Medical History:  Diagnosis Date  . AAA (abdominal aortic aneurysm) ([place])    a. 2010 s/p stenting  . Arthritis   . Bradycardia   . CAD (coronary artery disease) CABG in 2004   a. 2004 s/p CABG  . Carotid artery disease ([place])    a. 2012 dopplers with old LICA occlusion , RICA no significant abnormality  . CVA (cerebral infarction)    a. 2013 R Carona radiata stroke   . GERD (gastroesophageal reflux disease)   . Hyperlipidemia   . Hypertension   . Paroxysmal atrial fibrillation ([place]) 02/22/2009   a. on Coumadin   . Prediabetes   . Renal insufficiency   . Seborrheic dermatitis of scalp   . Tobacco abuse    Past Surgical History:  Procedure Laterality Date  . ABDOMINAL AORTIC ANEURYSM REPAIR  2010   Aortic stent repair  . CORONARY ARTERY BYPASS GRAFT  2004  . ROTATOR CUFF REPAIR  1990   Social History:   reports that he quit smoking about 5 years ago. His smoking use included Cigarettes. He smoked 0.00 packs per day for 35.00 years. He has never used smokeless tobacco. He reports that he does not drink alcohol or use drugs.  Family History  Problem Relation Age of Onset  . Diabetes Mother   . Stroke Father   . Thyroid cancer Sister   . Heart disease Brother     Coronary artery disease  . Heart attack Brother   .  Heart attack Brother     Medications:   Medication List       Accurate as of 01/24/16 12:40 PM. Always use your most recent med list.          acetaminophen 325 MG tablet Commonly known as:  TYLENOL Take 2 tablets (650 mg total) by mouth every 6 (six) hours as needed for mild pain (or Fever >/= 101).   aspirin EC 81 MG tablet Take 81 mg by mouth every other day. After supper   DULoxetine 30 MG capsule Commonly  known as:  CYMBALTA Take 30 mg by mouth daily.   gabapentin 100 MG capsule Commonly known as:  NEURONTIN Take 100 mg by mouth daily.   multivitamin tablet Take 1 tablet by mouth daily.   Oxycodone HCl 10 MG Tabs Take 1 tablet (10 mg total) by mouth every 6 (six) hours as needed.   predniSONE 20 MG tablet Commonly known as:  DELTASONE Take 1 tablet (20 mg total) by mouth daily.   pregabalin 50 MG capsule Commonly known as:  LYRICA Take 1 capsule (50 mg total) by mouth 3 (three) times daily.   simvastatin 40 MG tablet Commonly known as:  ZOCOR TAKE ONE TABLET BY MOUTH ONCE DAILY AFTER  SUPPER   vitamin B-12 1000 MCG tablet Commonly known as:  CYANOCOBALAMIN Take 1,000 mcg by mouth daily.   warfarin 5 MG tablet Commonly known as:  COUMADIN Take as directed by coumadin clinic       Immunizations: Immunization History  Administered Date(s) Administered  . Influenza Whole 01/22/2009  . Influenza, Seasonal, Injecte, Preservative Fre 03/08/2012  . Influenza,inj,Quad PF,36+ Mos 01/16/2014, 12/14/2014, 12/07/2015  . PPD Test 01/20/2016  . Pneumococcal Polysaccharide-23 01/25/2009, 01/20/2016  . Tdap 09/20/2011, 07/02/2012     Physical Exam:  Vitals:   01/24/16 1236  BP: 126/80  Pulse: 80  Resp: 18  Temp: 98.6 F (37 C)  TempSrc: Oral  SpO2: 95%  Weight: 215 lb 8 oz (97.8 kg)  Height: '5\' 11"'$  (1.803 m)   Body mass index is 30.06 kg/m.  General- elderly male, obese, in no acute distress Head- normocephalic, atraumatic Nose- no nasal discharge Throat- moist mucus membrane Eyes- no pallor, no icterus, legally blind in left eye Neck- no cervical lymphadenopathy Cardiovascular- normal s1,s2, no murmur Respiratory- bilateral clear to auscultation, no wheeze, no rhonchi, no crackles, no use of accessory muscles Abdomen- bowel sounds present, soft, non tender Musculoskeletal- able to move all 4 extremities, limited left leg range of motion, trace leg  edema Neurological- alert and oriented to person, place and time Skin- warm and dry Psychiatry- normal mood and affect    Labs reviewed: Basic Metabolic Panel:  Recent Labs  09/04/15 1725 09/30/15 1120 01/18/16 0823  NA 141 135 138  K 4.0 4.0 4.7  CL 108 102 106  CO2 '28 24 24  '$ GLUCOSE 90 105* 143*  BUN 21* 15 23*  CREATININE 1.42* 1.42* 1.45*  CALCIUM 8.8* 9.5 9.3   Liver Function Tests:  Recent Labs  12/31/15 0852  PROT 7.2   No results for input(s): LIPASE, AMYLASE in the last 8760 hours. No results for input(s): AMMONIA in the last 8760 hours. CBC:  Recent Labs  06/14/15 1253 09/04/15 1725 09/30/15 1120 01/18/16 0823  WBC 4.5 5.3 7.1 3.7*  NEUTROABS 2.7  --   --  2.9  HGB 14.0 12.2* 15.0 13.9  HCT 44.1 37.1* 45.2 42.0  MCV 89.3 88.1 87.9 87.7  PLT 142* 183  142* 169   Cardiac Enzymes: No results for input(s): CKTOTAL, CKMB, CKMBINDEX, TROPONINI in the last 8760 hours. BNP: Invalid input(s): POCBNP CBG: No results for input(s): GLUCAP in the last 8760 hours.  Radiological Exams: Ct Knee Left Wo Contrast  Result Date: 01/18/2016 CLINICAL DATA:  Status post fall.  Severe pain. EXAM: CT OF THE LEFT KNEE WITHOUT CONTRAST TECHNIQUE: Multidetector CT imaging of the LEFT knee was performed according to the standard protocol. Multiplanar CT image reconstructions were also generated. COMPARISON:  None. FINDINGS: Bones/Joint/Cartilage Generalized osteopenia. Left lateral tibial plateau fracture extending to the lateral margin of the lateral tibial spine and involving the periphery of the anteromedial lateral tibial plateau. No significant depression. No medial tibial plateau fracture. No other fracture or dislocation. Large joint effusion.  No Baker cyst. Normal alignment. Severe osteoarthritis of the lateral patellofemoral compartment. Chondrocalcinosis of the medial and lateral femorotibial compartments as can be seen with CPPD. Dystrophic calcification in the  patellofemoral compartment extending into the suprapatellar joint space concerning for calcified synovium. Ligaments Suboptimally assessed by CT.  ACL and PCL are grossly intact. Muscles and Tendons Muscles are normal. No significant muscle atrophy. No intramuscular fluid collection or hematoma. Intact quadriceps and patellar tendon. Soft tissues No fluid collection or hematoma. Peripheral vascular atherosclerotic disease. IMPRESSION: 1. Left lateral tibial plateau fracture without significant depression. Moderate-sized joint effusion. 2. Severe osteoarthritis of the lateral patellofemoral compartment. Electronically Signed   By: Kathreen Devoid   On: 01/18/2016 12:09   Dg Knee Complete 4 Views Left  Result Date: 01/18/2016 CLINICAL DATA:  Fall, knee pain EXAM: LEFT KNEE - COMPLETE 4+ VIEW COMPARISON:  None. FINDINGS: Mild chondrocalcinosis in the medial lateral joint. No significant joint space narrowing. Mild lateral joint margin spurring. Advanced degenerative change in the patellofemoral joint. Calcified loose bodies in the suprapatellar bursa. Small associated joint effusion Negative for fracture IMPRESSION: Severe degenerative change in the patellofemoral joint with calcified loose bodies in the suprapatellar bursa. No acute fracture. Electronically Signed   By: Franchot Gallo M.D.   On: 01/18/2016 09:21    Assessment/Plan  Unsteady gait Post left tibial fracture. Will have patient work with PT/OT as tolerated to regain strength and restore function.  Fall precautions are in place.  Left tibial fracture To provide pain management. Will have him work with physical therapy and occupational therapy team to help with gait training and muscle strengthening exercises.fall precautions. Skin care. Encourage to be out of bed. Continue tylenol and oxyIR on need basis as above for pain management. Continue coumadin for dvt prophylaxis. Has orthopedic follow up. NWB to LLE x 8 weeks  afib Rate controlled.  Continue coumadin with goal inr 2-3  ckd stage 3 Monitor bmp  CAD Chest pain free. Continue statin, aspirin  Neuropathic pain Continue gabapentin 100 mg daily with lyrica 50 mg tid. Monitor. Continue b12 supplement  Chronic depression Stable on duloxetine 30 mg daily  Carpal tunnel syndrome Confirmed by EMG. Continue prednisone 20 mg daily x 2 weeks from 01/14/16 until 01/28/16. He will need to be seen by hand surgery for carpal tunnel release evaluation   Goals of care: short term rehabilitation   Labs/tests ordered: cbc, cmp 01/31/16  Family/ staff Communication: reviewed care plan with patient and nursing supervisor    Blanchie Serve, MD Internal Medicine [place], [place] [address] Cell Phone (Monday-Friday 8 am - 5 pm): [phone] On Call: [phone] and follow prompts after 5  pm and on weekends Office Phone: [phone] Office Fax: [phone]

## 2016-01-26 ENCOUNTER — Non-Acute Institutional Stay (SKILLED_NURSING_FACILITY): Payer: Medicare HMO | Admitting: Family

## 2016-01-26 DIAGNOSIS — R208 Other disturbances of skin sensation: Secondary | ICD-10-CM

## 2016-01-26 DIAGNOSIS — I10 Essential (primary) hypertension: Secondary | ICD-10-CM

## 2016-01-26 DIAGNOSIS — L7682 Other postprocedural complications of skin and subcutaneous tissue: Secondary | ICD-10-CM

## 2016-01-26 NOTE — Progress Notes (Signed)
Location:  [place] Room Number: [phone] Place of Service:  SNF (31) Provider: Verdis Bassette FNP-C   Aldine Contes, MD  Patient Care Team: Aldine Contes, MD as PCP - General (Internal Medicine) Carlena Bjornstad, MD (Cardiology) Carlena Bjornstad, MD as Consulting Physician (Cardiology)  Extended Emergency Contact Information Primary Emergency Contact: [place] Address: [address]          [place], [place] [address] [place] Phone: [phone] Relation: Relative Secondary Emergency Contact: [place] Mobile Phone: [phone] Relation: Son  Code Status:  Full Code  Goals of care: Advanced Directive information Advanced Directives 01/18/2016  Does patient have an advance directive? No  Type of Advance Directive -  Does patient want to make changes to advanced directive? -  Copy of advanced directive(s) in chart? -  Would patient like information on creating an advanced directive? No - patient declined information  Pre-existing out of facility DNR order (yellow form or pink MOST form) -     Chief Complaint  Patient presents with  . Acute Visit    Elevated B/p     HPI:  Pt is a 70 y.o. male seen today at [facility]  for an acute visit for evaluation of elevated B/p. He has a significant medical history of HTN, CAD, Afib, TIA, CVA, CABG, AAA repair among other conditions. He is seen in his room today. He denies any acute issues. Facility Nurse reports B/P 148/93 during the night. Facility B/P log ranging in the 120's/80's-130's/90's. He denies any associated signs and symptoms. He is not currently on any B/P medications. He states left leg surgical incision under control with current medication. Discussed with patient elevated B/P will monitor for now then start on medication if needed.   Past Medical History:  Diagnosis Date  . AAA (abdominal aortic aneurysm) ([place])      a. 2010 s/p stenting  . Arthritis   . Bradycardia   . CAD (coronary artery disease) CABG in 2004   a. 2004 s/p CABG  . Carotid artery disease ([place])    a. 2012 dopplers with old LICA occlusion , RICA no significant abnormality  . CVA (cerebral infarction)    a. 2013 R Carona radiata stroke   . GERD (gastroesophageal reflux disease)   . Hyperlipidemia   . Hypertension   . Paroxysmal atrial fibrillation ([place]) 02/22/2009   a. on Coumadin   . Prediabetes   . Renal insufficiency   . Seborrheic dermatitis of scalp   . Tobacco abuse    Past Surgical History:  Procedure Laterality Date  . ABDOMINAL AORTIC ANEURYSM REPAIR  2010   Aortic stent repair  . CORONARY ARTERY BYPASS GRAFT  2004  . ROTATOR CUFF REPAIR  1990    Allergies  Allergen Reactions  . Diltiazem Hcl Itching      Medication List       Accurate as of 01/26/16  3:45 PM. Always use your most recent med list.          acetaminophen 325 MG tablet Commonly known as:  TYLENOL Take 2 tablets (650 mg total) by mouth every 6 (six) hours as needed for mild pain (or Fever >/= 101).   aspirin EC 81 MG tablet Take 81 mg by mouth every other day. After supper   DULoxetine 30 MG capsule Commonly known as:  CYMBALTA Take 30 mg by mouth daily.   gabapentin 100 MG  capsule Commonly known as:  NEURONTIN Take 100 mg by mouth daily.   multivitamin tablet Take 1 tablet by mouth daily.   Oxycodone HCl 10 MG Tabs Take 1 tablet (10 mg total) by mouth every 6 (six) hours as needed.   predniSONE 20 MG tablet Commonly known as:  DELTASONE Take 1 tablet (20 mg total) by mouth daily.   pregabalin 50 MG capsule Commonly known as:  LYRICA Take 1 capsule (50 mg total) by mouth 3 (three) times daily.   simvastatin 40 MG tablet Commonly known as:  ZOCOR TAKE ONE TABLET BY MOUTH ONCE DAILY AFTER  SUPPER   vitamin B-12 1000 MCG tablet Commonly known as:  CYANOCOBALAMIN Take 1,000 mcg by mouth daily.   warfarin 5 MG  tablet Commonly known as:  COUMADIN Take as directed by coumadin clinic       Review of Systems  Constitutional: Negative for activity change, appetite change, chills, fatigue and fever.  HENT: Negative for congestion, rhinorrhea, sinus pressure, sneezing and sore throat.   Eyes: Negative.   Respiratory: Negative for cough, chest tightness, shortness of breath and wheezing.   Cardiovascular: Negative for chest pain, palpitations and leg swelling.  Gastrointestinal: Negative for abdominal distention, abdominal pain, constipation, diarrhea, nausea and vomiting.  Genitourinary: Negative for dysuria, frequency and urgency.  Musculoskeletal: Positive for gait problem.       Left knee surgical incision   Skin: Negative for color change, pallor and rash.  Neurological: Negative for dizziness, syncope, weakness, light-headedness and headaches.  Psychiatric/Behavioral: Negative for agitation, confusion, hallucinations and sleep disturbance. The patient is not nervous/anxious.     Immunization History  Administered Date(s) Administered  . Influenza Whole 01/22/2009  . Influenza, Seasonal, Injecte, Preservative Fre 03/08/2012  . Influenza,inj,Quad PF,36+ Mos 01/16/2014, 12/14/2014, 12/07/2015  . PPD Test 01/20/2016  . Pneumococcal Polysaccharide-23 01/25/2009, 01/20/2016  . Tdap 09/20/2011, 07/02/2012   Pertinent  Health Maintenance Due  Topic Date Due  . COLONOSCOPY  06/02/1993  . PNA vac Low Risk Adult (2 of 2 - PCV13) 01/19/2017  . INFLUENZA VACCINE  Completed   Fall Risk  01/13/2016 12/07/2015 10/29/2015 07/06/2015 06/01/2015  Falls in the past year? Yes Yes Yes Yes Yes  Number falls in past yr: '1 1 1 1 1  '$ Injury with Fall? No Yes Yes Yes Yes  Risk Factor Category  High Fall Risk High Fall Risk - High Fall Risk High Fall Risk  Risk for fall due to : History of fall(s) History of fall(s);Impaired balance/gait - History of fall(s) History of fall(s);Impaired balance/gait;Other (Comment)   Risk for fall due to (comments): - - - - fell out of tube  Follow up Falls prevention discussed Falls prevention discussed - - Falls prevention discussed      Vitals:   01/26/16 1145  BP: 123/80  Pulse: 84  Resp: 18  Temp: 98.4 F (36.9 C)  SpO2: 98%  Weight: 215 lb (97.5 kg)  Height: '5\' 11"'$  (1.803 m)   Body mass index is 29.99 kg/m. Physical Exam  Constitutional: He is oriented to person, place, and time. He appears well-developed and well-nourished. No distress.  HENT:  Head: Normocephalic.  Mouth/Throat: Oropharynx is clear and moist. No oropharyngeal exudate.  Cardiovascular: Normal rate, regular rhythm, normal heart sounds and intact distal pulses.  Exam reveals no gallop and no friction rub.   No murmur heard. Sternum old incision noted   Pulmonary/Chest: Effort normal and breath sounds normal. No respiratory distress. He has no wheezes. He  has no rales.  Abdominal: Soft. Bowel sounds are normal. He exhibits no distension. There is no tenderness. There is no rebound and no guarding.  Genitourinary:  Genitourinary Comments: Continent   Musculoskeletal: He exhibits no edema, tenderness or deformity.  Moves x 4 extremities. LLE full leg immobilizer in place.   Neurological: He is oriented to person, place, and time.  Skin: Skin is warm and dry. No rash noted. No erythema. No pallor.  Left knee surgical incision drsg dry clean and intact. Surrounding skin without any signs of infections.   Psychiatric: He has a normal mood and affect.    Labs reviewed:  Recent Labs  09/04/15 1725 09/30/15 1120 01/18/16 0823  NA 141 135 138  K 4.0 4.0 4.7  CL 108 102 106  CO2 '28 24 24  '$ GLUCOSE 90 105* 143*  BUN 21* 15 23*  CREATININE 1.42* 1.42* 1.45*  CALCIUM 8.8* 9.5 9.3    Recent Labs  12/31/15 0852  PROT 7.2    Recent Labs  06/14/15 1253 09/04/15 1725 09/30/15 1120 01/18/16 0823  WBC 4.5 5.3 7.1 3.7*  NEUTROABS 2.7  --   --  2.9  HGB 14.0 12.2* 15.0 13.9   HCT 44.1 37.1* 45.2 42.0  MCV 89.3 88.1 87.9 87.7  PLT 142* 183 142* 169   Lab Results  Component Value Date   TSH 0.669 03/08/2012   Lab Results  Component Value Date   HGBA1C 5.6 01/16/2014   Lab Results  Component Value Date   CHOL 124 (L) 11/26/2015   HDL 41 11/26/2015   LDLCALC 20 11/26/2015   LDLDIRECT 46 11/24/2011   TRIG 314 (H) 11/26/2015   CHOLHDL 3.0 11/26/2015   Assessment/Plan HTN Elevated B/P 148/93 overnight per facility Nurse. B/p log ranging in the 120's/80's to 130/90's. Continue to monitor B/p and HR Q shift. Will initiate meds if remains > 140's.   Left leg surgical incision   Afebrile. Drsg dry clean and intact without any signs of infections. Continue current pain regimen. Continue warfarin for DVT prevention.continue PT/OT. Full leg immobilizer in place.    Family/ staff Communication: Reviewed plan of care with patient and facility Nurse supervisor.   Labs/tests ordered: None

## 2016-02-01 ENCOUNTER — Non-Acute Institutional Stay (SKILLED_NURSING_FACILITY): Payer: Medicare HMO | Admitting: Family

## 2016-02-01 DIAGNOSIS — G629 Polyneuropathy, unspecified: Secondary | ICD-10-CM | POA: Diagnosis not present

## 2016-02-01 DIAGNOSIS — F3342 Major depressive disorder, recurrent, in full remission: Secondary | ICD-10-CM | POA: Diagnosis not present

## 2016-02-01 DIAGNOSIS — M25562 Pain in left knee: Secondary | ICD-10-CM | POA: Diagnosis not present

## 2016-02-01 NOTE — Progress Notes (Signed)
   01/22/16 1238  OT Visit Information  Last OT Received On 01/22/16  OT G-codes **NOT FOR INPATIENT CLASS**  Functional Assessment Tool Used clinical judgement  Functional Limitation Self care  Self Care Current Status (A3094) CL  Self Care Goal Status [phone]) CI  [facility], OTR/L  For [place], [place] 02/01/2016

## 2016-02-01 NOTE — Progress Notes (Signed)
Location:  [place] Room Number: [place] of Service:  SNF (31) Provider:  Usama Harkless FNP-C   Aldine Contes, MD  Patient Care Team: Aldine Contes, MD as PCP - General (Internal Medicine) Carlena Bjornstad, MD (Cardiology) Carlena Bjornstad, MD as Consulting Physician (Cardiology)  Extended Emergency Contact Information Primary Emergency Contact: [place] Address: [address]          [place], [place] [address] [place] Phone: [phone] Relation: Relative Secondary Emergency Contact: [place] Mobile Phone: [phone] Relation: Son  Code Status:  Full Code  Goals of care: Advanced Directive information Advanced Directives 01/18/2016  Does patient have an advance directive? No  Type of Advance Directive -  Does patient want to make changes to advanced directive? -  Copy of advanced directive(s) in chart? -  Would patient like information on creating an advanced directive? No - patient declined information  Pre-existing out of facility DNR order (yellow form or pink MOST form) -     Chief Complaint  Patient presents with  . Acute Visit    Medication evaluation     HPI:  Pt is a 70 y.o. male seen today at [facility] for an acute visit for evaluation of medication. He is seen in his room today per facility Nurse request.Facility Nurse reports patient has been refusing to take his Cymbalta states makes him feel "Funny". He has also refused to take Lyrica states does not want strong medication. Prefers to take tylenol.    Past Medical History:  Diagnosis Date  . AAA (abdominal aortic aneurysm) ([place])    a. 2010 s/p stenting  . Arthritis   . Bradycardia   . CAD (coronary artery disease) CABG in 2004   a. 2004 s/p CABG  . Carotid artery disease ([place])    a. 2012 dopplers with old LICA occlusion , RICA no significant abnormality  . CVA (cerebral  infarction)    a. 2013 R Carona radiata stroke   . GERD (gastroesophageal reflux disease)   . Hyperlipidemia   . Hypertension   . Paroxysmal atrial fibrillation ([place]) 02/22/2009   a. on Coumadin   . Prediabetes   . Renal insufficiency   . Seborrheic dermatitis of scalp   . Tobacco abuse    Past Surgical History:  Procedure Laterality Date  . ABDOMINAL AORTIC ANEURYSM REPAIR  2010   Aortic stent repair  . CORONARY ARTERY BYPASS GRAFT  2004  . ROTATOR CUFF REPAIR  1990    Allergies  Allergen Reactions  . Diltiazem Hcl Itching      Medication List       Accurate as of 02/01/16  4:51 PM. Always use your most recent med list.          acetaminophen 325 MG tablet Commonly known as:  TYLENOL Take 2 tablets (650 mg total) by mouth every 6 (six) hours as needed for mild pain (or Fever >/= 101).   aspirin EC 81 MG tablet Take 81 mg by mouth every other day. After supper   gabapentin 100 MG capsule Commonly known as:  NEURONTIN Take 100 mg by mouth daily.   multivitamin tablet Take 1 tablet by mouth daily.   Oxycodone HCl 10 MG Tabs Take 1 tablet (10 mg total) by mouth every 6 (six) hours as needed.   simvastatin 40 MG tablet Commonly known as:  ZOCOR TAKE ONE TABLET BY MOUTH ONCE  DAILY AFTER  SUPPER   vitamin B-12 1000 MCG tablet Commonly known as:  CYANOCOBALAMIN Take 1,000 mcg by mouth daily.   warfarin 5 MG tablet Commonly known as:  COUMADIN Take as directed by coumadin clinic       Review of Systems  Constitutional: Negative for activity change, appetite change, chills, fatigue and fever.  HENT: Negative for congestion, rhinorrhea, sinus pressure, sneezing and sore throat.   Eyes: Negative.   Respiratory: Negative for cough, chest tightness, shortness of breath and wheezing.   Cardiovascular: Negative for chest pain, palpitations and leg swelling.  Gastrointestinal: Negative for abdominal distention, abdominal pain, constipation, diarrhea, nausea and  vomiting.  Genitourinary: Negative for dysuria, frequency and urgency.  Musculoskeletal: Positive for gait problem.       Left knee surgical incision. Had follow up Ortho appointment but states will be rescheduled.   Skin: Negative for color change, pallor and rash.  Neurological: Negative for dizziness, syncope, weakness, light-headedness and headaches.  Psychiatric/Behavioral: Negative for agitation, confusion, hallucinations and sleep disturbance. The patient is not nervous/anxious.     Immunization History  Administered Date(s) Administered  . Influenza Whole 01/22/2009  . Influenza, Seasonal, Injecte, Preservative Fre 03/08/2012  . Influenza,inj,Quad PF,36+ Mos 01/16/2014, 12/14/2014, 12/07/2015  . PPD Test 01/20/2016  . Pneumococcal Polysaccharide-23 01/25/2009, 01/20/2016  . Tdap 09/20/2011, 07/02/2012   Pertinent  Health Maintenance Due  Topic Date Due  . COLONOSCOPY  06/02/1993  . PNA vac Low Risk Adult (2 of 2 - PCV13) 01/19/2017  . INFLUENZA VACCINE  Completed   Fall Risk  01/13/2016 12/07/2015 10/29/2015 07/06/2015 06/01/2015  Falls in the past year? Yes Yes Yes Yes Yes  Number falls in past yr: '1 1 1 1 1  '$ Injury with Fall? No Yes Yes Yes Yes  Risk Factor Category  High Fall Risk High Fall Risk - High Fall Risk High Fall Risk  Risk for fall due to : History of fall(s) History of fall(s);Impaired balance/gait - History of fall(s) History of fall(s);Impaired balance/gait;Other (Comment)  Risk for fall due to (comments): - - - - fell out of tube  Follow up Falls prevention discussed Falls prevention discussed - - Falls prevention discussed       Vitals:   02/01/16 1230  BP: 136/78  Pulse: 60  Resp: 18  Temp: 98.4 F (36.9 C)  SpO2: 94%  Weight: 215 lb (97.5 kg)  Height: '5\' 11"'$  (1.803 m)   Body mass index is 29.99 kg/m. Physical Exam  Constitutional: He is oriented to person, place, and time. He appears well-developed and well-nourished. No distress.  HENT:    Head: Normocephalic.  Mouth/Throat: Oropharynx is clear and moist. No oropharyngeal exudate.  Eyes: Conjunctivae and EOM are normal. Pupils are equal, round, and reactive to light. No scleral icterus.  Neck: Normal range of motion. No JVD present. No thyromegaly present.  Cardiovascular: Normal rate, regular rhythm, normal heart sounds and intact distal pulses.  Exam reveals no gallop and no friction rub.   No murmur heard. Sternum old incision noted   Pulmonary/Chest: Effort normal and breath sounds normal. No respiratory distress. He has no wheezes. He has no rales.  Abdominal: Soft. Bowel sounds are normal. He exhibits no distension. There is no tenderness. There is no rebound and no guarding.  Genitourinary:  Genitourinary Comments: Continent   Musculoskeletal: He exhibits no edema, tenderness or deformity.  Moves x 4 extremities. LLE full leg immobilizer in place.   Lymphadenopathy:    He has no  cervical adenopathy.  Neurological: He is oriented to person, place, and time.  Skin: Skin is warm and dry. No rash noted. No erythema. No pallor.  Left knee surgical incision drsg dry clean and intact. Surrounding skin without any signs of infections.   Psychiatric: He has a normal mood and affect.    Labs reviewed:  Recent Labs  09/04/15 1725 09/30/15 1120 01/18/16 0823  NA 141 135 138  K 4.0 4.0 4.7  CL 108 102 106  CO2 '28 24 24  '$ GLUCOSE 90 105* 143*  BUN 21* 15 23*  CREATININE 1.42* 1.42* 1.45*  CALCIUM 8.8* 9.5 9.3    Recent Labs  12/31/15 0852  PROT 7.2    Recent Labs  06/14/15 1253 09/04/15 1725 09/30/15 1120 01/18/16 0823  WBC 4.5 5.3 7.1 3.7*  NEUTROABS 2.7  --   --  2.9  HGB 14.0 12.2* 15.0 13.9  HCT 44.1 37.1* 45.2 42.0  MCV 89.3 88.1 87.9 87.7  PLT 142* 183 142* 169   Lab Results  Component Value Date   TSH 0.669 03/08/2012   Lab Results  Component Value Date   HGBA1C 5.6 01/16/2014   Lab Results  Component Value Date   CHOL 124 (L)  11/26/2015   HDL 41 11/26/2015   LDLCALC 20 11/26/2015   LDLDIRECT 46 11/24/2011   TRIG 314 (H) 11/26/2015   CHOLHDL 3.0 11/26/2015   Assessment/Plan 1. Recurrent major depressive disorder, in full remission ([place]) Has been refusing Cymbalta for the past several days.Unable to gradual reduce dose since he has not taken medication for several days. D/c Cymbalta.Monitor for mood changes.    2. Peripheral polyneuropathy ([facility]) Refused Lyrica states does not like strong medication. Prefers Tylenol and PRN med. Continue on Gabapentin. D/C Lyrica.     Family/ staff Communication: Reviewed plan of care with patient and facility Nurse supervisor.   Labs/tests ordered: None

## 2016-02-02 ENCOUNTER — Telehealth: Payer: Self-pay | Admitting: Interventional Cardiology

## 2016-02-02 DIAGNOSIS — M47812 Spondylosis without myelopathy or radiculopathy, cervical region: Secondary | ICD-10-CM | POA: Diagnosis not present

## 2016-02-02 DIAGNOSIS — S62002S Unspecified fracture of navicular [scaphoid] bone of left wrist, sequela: Secondary | ICD-10-CM | POA: Diagnosis not present

## 2016-02-02 DIAGNOSIS — G8929 Other chronic pain: Secondary | ICD-10-CM | POA: Diagnosis not present

## 2016-02-02 DIAGNOSIS — M79641 Pain in right hand: Secondary | ICD-10-CM | POA: Diagnosis not present

## 2016-02-02 DIAGNOSIS — M19131 Post-traumatic osteoarthritis, right wrist: Secondary | ICD-10-CM | POA: Diagnosis not present

## 2016-02-02 DIAGNOSIS — M542 Cervicalgia: Secondary | ICD-10-CM | POA: Diagnosis not present

## 2016-02-02 DIAGNOSIS — M549 Dorsalgia, unspecified: Secondary | ICD-10-CM | POA: Diagnosis not present

## 2016-02-02 DIAGNOSIS — M19132 Post-traumatic osteoarthritis, left wrist: Secondary | ICD-10-CM | POA: Diagnosis not present

## 2016-02-02 DIAGNOSIS — G5603 Carpal tunnel syndrome, bilateral upper limbs: Secondary | ICD-10-CM | POA: Diagnosis not present

## 2016-02-02 DIAGNOSIS — M79642 Pain in left hand: Secondary | ICD-10-CM | POA: Diagnosis not present

## 2016-02-02 LAB — CBC AND DIFFERENTIAL
HEMATOCRIT: 41 % (ref 41–53)
Hemoglobin: 13 g/dL — AB (ref 13.5–17.5)
PLATELETS: 274 10*3/uL (ref 150–399)
WBC: 7.9 10*3/mL

## 2016-02-02 LAB — BASIC METABOLIC PANEL
BUN: 35 mg/dL — AB (ref 4–21)
CREATININE: 1.3 mg/dL (ref 0.6–1.3)
Glucose: 89 mg/dL
Potassium: 4.9 mmol/L (ref 3.4–5.3)
Sodium: 138 mmol/L (ref 137–147)

## 2016-02-02 NOTE — Telephone Encounter (Signed)
Returned call to [facility] & she stated that the pt is in their facility for rehab & will be for a while-no d/c date at this time.  She stated they can draw his labs & take care of coumadin dosing per their Nurse Practitioner & Physician. Spoke with [employer] & she states that they have been monitoring INR & dosing the pt's Coumadin since he has been in the facility.  Also, she stated once he is discharged from facility they would fax over his last INR & coumadin dosing to [facility] fax number-which was given & confirmed over the phone.

## 2016-02-02 NOTE — Progress Notes (Signed)
Late entry for missed G code   February 09, 2016 0943  PT G-Codes **NOT FOR INPATIENT CLASS**  Functional Assessment Tool Used clinical judgement  Functional Limitation Mobility: Walking and moving around  Mobility: Walking and Moving Around Current Status [phone]) CL  Mobility: Walking and Moving Around Goal Status ([id number]) Evern Bio, PT  Office # [phone] Pager [phone]

## 2016-02-02 NOTE — Telephone Encounter (Signed)
New Message  [facility] call requesting to speak with RN about getting an order to there location for pt coumadin. Pt has an appt but [facility] would rather do pt coumadin at there location. Please call back to discuss

## 2016-02-08 DIAGNOSIS — M542 Cervicalgia: Secondary | ICD-10-CM | POA: Diagnosis not present

## 2016-02-08 DIAGNOSIS — G5603 Carpal tunnel syndrome, bilateral upper limbs: Secondary | ICD-10-CM | POA: Diagnosis not present

## 2016-02-17 ENCOUNTER — Non-Acute Institutional Stay (SKILLED_NURSING_FACILITY): Payer: Medicare HMO | Admitting: Internal Medicine

## 2016-02-17 ENCOUNTER — Encounter: Payer: Self-pay | Admitting: Internal Medicine

## 2016-02-17 DIAGNOSIS — R2681 Unsteadiness on feet: Secondary | ICD-10-CM | POA: Diagnosis not present

## 2016-02-17 DIAGNOSIS — G629 Polyneuropathy, unspecified: Secondary | ICD-10-CM

## 2016-02-17 DIAGNOSIS — N183 Chronic kidney disease, stage 3 unspecified: Secondary | ICD-10-CM

## 2016-02-17 DIAGNOSIS — G5603 Carpal tunnel syndrome, bilateral upper limbs: Secondary | ICD-10-CM

## 2016-02-17 DIAGNOSIS — I480 Paroxysmal atrial fibrillation: Secondary | ICD-10-CM

## 2016-02-17 DIAGNOSIS — S82102S Unspecified fracture of upper end of left tibia, sequela: Secondary | ICD-10-CM

## 2016-02-17 DIAGNOSIS — I2510 Atherosclerotic heart disease of native coronary artery without angina pectoris: Secondary | ICD-10-CM

## 2016-02-17 NOTE — Progress Notes (Signed)
LOCATION: [facility]  PCP: Dylan Contes, MD   Code Status: Full Code  Goals of care: Advanced Directive information Advanced Directives 01/18/2016  Does patient have an advance directive? No  Type of Advance Directive -  Does patient want to make changes to advanced directive? -  Copy of advanced directive(s) in chart? -  Would patient like information on creating an advanced directive? No - patient declined information  Pre-existing out of facility DNR order (yellow form or pink MOST form) -       Extended Emergency Contact Information Primary Emergency Contact: Dylan Morgan Address: [place], [place] [address] [place] Phone: [phone] Relation: Relative Secondary Emergency Contact: Dylan Morgan of [place] Mobile Phone: [phone] Relation: Son   Allergies  Allergen Reactions  . Diltiazem Hcl Itching    Chief Complaint  Patient presents with  . Discharge Note    Discharge Visit     HPI:  Patient is a 70 y.o. male seen today for discharge visit. He has been here for short term rehabilitation post hospital admission from 01/18/16-01/20/16 with left tibial closed fracture post fall and has been under conservative management. He is NWB to LLE at present and sees orthopedics tomorrow 02/18/16. He has been seen by [facility] for carpal tunnel syndrome and has wrist splint post nerve conduction study. He denies pain. He has an immobilizer in place.  Review of Systems:  Constitutional: Negative for fever HENT: Negative for headache, congestion Eyes: Negative for blurred vision Respiratory: Negative for cough, shortness of breath Cardiovascular: Negative for chest pain, palpitations, leg swelling.  Gastrointestinal: Negative for heartburn, nausea, vomiting, abdominal pain. Last bowel movement was yesterday. Genitourinary: Negative for dysuria Musculoskeletal: Negative for back pain,  fall in the facility.  Skin: Negative for itching, rash.  Neurological: Negative for dizziness.    Past Medical History:  Diagnosis Date  . AAA (abdominal aortic aneurysm) ([facility])    a. 2010 s/p stenting  . Arthritis   . Bradycardia   . CAD (coronary artery disease) CABG in 2004   a. 2004 s/p CABG  . Carotid artery disease ([place])    a. 2012 dopplers with old LICA occlusion , RICA no significant abnormality  . CVA (cerebral infarction)    a. 2013 R Carona radiata stroke   . GERD (gastroesophageal reflux disease)   . Hyperlipidemia   . Hypertension   . Paroxysmal atrial fibrillation ([facility]) 02/22/2009   a. on Coumadin   . Prediabetes   . Renal insufficiency   . Seborrheic dermatitis of scalp   . Tobacco abuse    Past Surgical History:  Procedure Laterality Date  . ABDOMINAL AORTIC ANEURYSM REPAIR  2010   Aortic stent repair  . CORONARY ARTERY BYPASS GRAFT  2004  . ROTATOR CUFF REPAIR  1990   Social History:   reports that he quit smoking about 6 years ago. His smoking use included Cigarettes. He smoked 0.00 packs per day for 35.00 years. He has never used smokeless tobacco. He reports that he does not drink alcohol or use drugs.  Family History  Problem Relation Age of Onset  . Diabetes Mother   . Stroke Father   . Thyroid cancer Sister   . Heart disease Brother     Coronary artery disease  . Heart attack Brother   . Heart attack Brother     Medications:   Medication List  Accurate as of 02/17/16  2:07 PM. Always use your most recent med list.          acetaminophen 325 MG tablet Commonly known as:  TYLENOL Take 2 tablets (650 mg total) by mouth every 6 (six) hours as needed for mild pain (or Fever >/= 101).   aspirin EC 81 MG tablet Take 81 mg by mouth every other day. After supper   gabapentin 100 MG capsule Commonly known as:  NEURONTIN Take 100 mg by mouth daily.   multivitamin tablet Take 1 tablet by mouth daily.   Oxycodone HCl 10 MG  Tabs Take 1 tablet (10 mg total) by mouth every 6 (six) hours as needed.   simvastatin 40 MG tablet Commonly known as:  ZOCOR TAKE ONE TABLET BY MOUTH ONCE DAILY AFTER  SUPPER   vitamin B-12 1000 MCG tablet Commonly known as:  CYANOCOBALAMIN Take 1,000 mcg by mouth daily.   warfarin 3 MG tablet Commonly known as:  COUMADIN Take 3 mg by mouth daily.       Immunizations: Immunization History  Administered Date(s) Administered  . Influenza Whole 01/22/2009  . Influenza, Seasonal, Injecte, Preservative Fre 03/08/2012  . Influenza,inj,Quad PF,36+ Mos 01/16/2014, 12/14/2014, 12/07/2015  . PPD Test 01/20/2016  . Pneumococcal Polysaccharide-23 01/25/2009, 01/20/2016  . Tdap 09/20/2011, 07/02/2012     Physical Exam:  Vitals:   02/17/16 1239  BP: (!) 142/78  Pulse: 72  Resp: 17  Temp: 97.9 F (36.6 C)  TempSrc: Oral  SpO2: 95%  Weight: 233 lb 6.4 oz (105.9 kg)  Height: '5\' 11"'$  (1.803 m)   Body mass index is 32.55 kg/m.  General- elderly male, obese, in no acute distress Head- normocephalic, atraumatic Nose- no nasal discharge Throat- moist mucus membrane Eyes- no pallor, no icterus, legally blind in left eye Neck- no cervical lymphadenopathy Cardiovascular- normal s1,s2, no murmur Respiratory- bilateral clear to auscultation Abdomen- bowel sounds present, soft, non tender Musculoskeletal- able to move all 4 extremities, limited left leg range of motion, trace leg edema Neurological- alert and oriented to person, place and time Skin- warm and dry Psychiatry- normal mood and affect    Labs reviewed: Basic Metabolic Panel:  Recent Labs  09/04/15 1725 09/30/15 1120 01/18/16 0823 02/02/16  NA 141 135 138 138  K 4.0 4.0 4.7 4.9  CL 108 102 106  --   CO2 '28 24 24  '$ --   GLUCOSE 90 105* 143*  --   BUN 21* 15 23* 35*  CREATININE 1.42* 1.42* 1.45* 1.3  CALCIUM 8.8* 9.5 9.3  --    Liver Function Tests:  Recent Labs  12/31/15 0852  PROT 7.2   No results  for input(s): LIPASE, AMYLASE in the last 8760 hours. No results for input(s): AMMONIA in the last 8760 hours. CBC:  Recent Labs  06/14/15 1253 09/04/15 1725 09/30/15 1120 01/18/16 0823 02/02/16  WBC 4.5 5.3 7.1 3.7* 7.9  NEUTROABS 2.7  --   --  2.9  --   HGB 14.0 12.2* 15.0 13.9 13.0*  HCT 44.1 37.1* 45.2 42.0 41  MCV 89.3 88.1 87.9 87.7  --   PLT 142* 183 142* 169 274     Assessment/Plan  Unsteady gait Post left tibial fracture. Will have patient work with PT/OT as tolerated to regain strength and restore function.  Fall precautions are in place.  Left tibial fracture Currently on oxycodone 10 mg q6h prn pain and tylenol 650 mg q6h prn pain. to work with PT and OT  services. Continue coumadin for dvt prophylaxis. Has orthopedic follow up. NWB to LLE x 8 weeks  afib Rate controlled. Continue coumadin with goal inr 2-3. inr today 2.7. Continue coumadin 3 mg daily and check inr 02/21/16.   ckd stage 3 Stable, reviewed labs  CAD Chest pain free. Continue statin, aspirin  Neuropathic pain Continue gabapentin and b12 supplement. Off lyrica per patient request  Carpal tunnel syndrome Confirmed by EMG. Completed prednisone course. Advised to wear his wrist splint.   Patient is being discharged with the following home health services:  PT and OT  Patient is being discharged with the following durable medical equipment:  None. He has a walker and wheelchair at home.  Patient has been advised to f/u with their PCP in 1-2 weeks to bring them up to date on their rehab stay.  Social services at facility was responsible for arranging this appointment.  Pt was provided with a 30 day supply of prescriptions for medications and refills must be obtained from their PCP. Patient will be discharged home with his current med supply (remaining) from the SNF. He is being discharged with 27 pills of oxycodone. Thus, no hardscript for this medication is being provided. He will need to contact his  PCP or orthopedic for further supply.    Family/ staff Communication: reviewed care plan with patient and nursing supervisor    Blanchie Serve, MD Internal Medicine [place], [place] [address] Cell Phone (Monday-Friday 8 am - 5 pm): [phone] On Call: [phone] and follow prompts after 5 pm and on weekends Office Phone: [phone] Office Fax: [phone]

## 2016-02-18 DIAGNOSIS — M542 Cervicalgia: Secondary | ICD-10-CM | POA: Diagnosis not present

## 2016-02-18 DIAGNOSIS — M545 Low back pain: Secondary | ICD-10-CM | POA: Diagnosis not present

## 2016-02-23 ENCOUNTER — Other Ambulatory Visit: Payer: Self-pay | Admitting: Orthopedic Surgery

## 2016-02-23 DIAGNOSIS — M542 Cervicalgia: Secondary | ICD-10-CM

## 2016-02-23 DIAGNOSIS — M545 Low back pain: Secondary | ICD-10-CM

## 2016-02-23 DIAGNOSIS — S82122A Displaced fracture of lateral condyle of left tibia, initial encounter for closed fracture: Secondary | ICD-10-CM | POA: Diagnosis not present

## 2016-02-28 ENCOUNTER — Telehealth: Payer: Self-pay | Admitting: Internal Medicine

## 2016-02-28 NOTE — Telephone Encounter (Signed)
APT. REMINDER CALL, LMTCB °

## 2016-02-29 ENCOUNTER — Ambulatory Visit (INDEPENDENT_AMBULATORY_CARE_PROVIDER_SITE_OTHER): Payer: Medicare HMO | Admitting: Internal Medicine

## 2016-02-29 VITALS — BP 151/93 | HR 55 | Temp 97.6°F | Ht 70.5 in | Wt 241.0 lb

## 2016-02-29 DIAGNOSIS — G629 Polyneuropathy, unspecified: Secondary | ICD-10-CM

## 2016-02-29 DIAGNOSIS — I2510 Atherosclerotic heart disease of native coronary artery without angina pectoris: Secondary | ICD-10-CM

## 2016-02-29 DIAGNOSIS — I10 Essential (primary) hypertension: Secondary | ICD-10-CM | POA: Diagnosis not present

## 2016-02-29 DIAGNOSIS — S82142D Displaced bicondylar fracture of left tibia, subsequent encounter for closed fracture with routine healing: Secondary | ICD-10-CM

## 2016-02-29 DIAGNOSIS — R7303 Prediabetes: Secondary | ICD-10-CM

## 2016-02-29 DIAGNOSIS — I480 Paroxysmal atrial fibrillation: Secondary | ICD-10-CM

## 2016-02-29 DIAGNOSIS — Z7901 Long term (current) use of anticoagulants: Secondary | ICD-10-CM

## 2016-02-29 DIAGNOSIS — Z87891 Personal history of nicotine dependence: Secondary | ICD-10-CM

## 2016-02-29 DIAGNOSIS — W19XXXD Unspecified fall, subsequent encounter: Secondary | ICD-10-CM

## 2016-02-29 LAB — POCT INR: INR: 2.8

## 2016-02-29 LAB — POCT GLYCOSYLATED HEMOGLOBIN (HGB A1C): HEMOGLOBIN A1C: 5.8

## 2016-02-29 LAB — GLUCOSE, CAPILLARY: GLUCOSE-CAPILLARY: 94 mg/dL (ref 65–99)

## 2016-02-29 MED ORDER — ONE-DAILY MULTI VITAMINS PO TABS: 1.0000 | ORAL_TABLET | Freq: Every day | ORAL | 3 refills | Status: AC

## 2016-02-29 MED ORDER — VITAMIN B-12 1000 MCG PO TABS: 1000.0000 ug | ORAL_TABLET | Freq: Every day | ORAL | 0 refills | Status: DC

## 2016-02-29 MED ORDER — SIMVASTATIN 40 MG PO TABS: ORAL_TABLET | ORAL | 2 refills | Status: DC

## 2016-02-29 MED ORDER — GABAPENTIN 100 MG PO CAPS: 100.0000 mg | ORAL_CAPSULE | Freq: Every day | ORAL | 1 refills | Status: DC

## 2016-02-29 NOTE — Patient Instructions (Addendum)
We will call you if we need to adjust your coumadin dose.   Please come back in January to see Dr. Dareen Piano.

## 2016-02-29 NOTE — Progress Notes (Signed)
   CC: bilateral feet pain  HPI:  [name] is a 70 y.o. male who presents today for bilateral feet pain. Please see assessment & plan for status of chronic medical problems.   Past Medical History:  Diagnosis Date  . AAA (abdominal aortic aneurysm) ([facility])    a. 2010 s/p stenting  . Arthritis   . Bradycardia   . CAD (coronary artery disease) CABG in 2004   a. 2004 s/p CABG  . Carotid artery disease ([place])    a. 2012 dopplers with old LICA occlusion , RICA no significant abnormality  . CVA (cerebral infarction)    a. 2013 R Carona radiata stroke   . GERD (gastroesophageal reflux disease)   . Hyperlipidemia   . Hypertension   . Paroxysmal atrial fibrillation ([facility]) 02/22/2009   a. on Coumadin   . Prediabetes   . Renal insufficiency   . Seborrheic dermatitis of scalp   . Tobacco abuse     Review of Systems:  Please see each problem below for a pertinent review of systems.  Physical Exam:  Vitals:   02/29/16 0951  BP: (!) 162/90  Pulse: 62  Temp: 97.6 F (36.4 C)  TempSrc: Oral  SpO2: 98%  Weight: 241 lb (109.3 kg)  Height: 5' 10.5" (1.791 m)   Physical Exam  Constitutional: He is oriented to person, place, and time. No distress.  HENT:  Head: Normocephalic and atraumatic.  Eyes: Conjunctivae are normal. No scleral icterus.  Dysconjugate gaze  Cardiovascular: Normal rate and regular rhythm.   Pulmonary/Chest: Effort normal. No respiratory distress.  Musculoskeletal: He exhibits no tenderness (Left knee).  Neurological: He is alert and oriented to person, place, and time.  Skin: Skin is warm and dry. He is not diaphoretic.     Assessment & Plan:   See Encounters Tab for problem based charting.  Patient discussed with Dr. Angelia Mould

## 2016-03-02 NOTE — Assessment & Plan Note (Signed)
Assessment He was hospitalized 01/18/16 through 01/20/16 and was discharged to short-term rehabilitation  after sustaining a closed fracture left tibia from a fall. He was receiving PT/OT services at home and has been discharged from both. He had follow-up with orthopedics on 02/18/16 who recommended he remove his knee stabilizer.  Left knee XR 01/18/16 Severe degenerative change in the patellofemoral joint with calcified loose bodies in the suprapatellar bursa. No acute fracture.  He is back home with his son. He does require a handlebar for transfers in the bathroom off the toilet and has a walker and wheelchair. He flushed his remaining oxycodone/acetaminophen tablets down the toilet as he did not like how they made him feel.    Plan -Recommend he follow the care plan outlined by his orthopedist

## 2016-03-02 NOTE — Assessment & Plan Note (Addendum)
Assessment For his neuropathic pain in his feet, he finds the gabapentin 100 mg very alleviating and feels his sleep has improved as a result. He would like for [place] refill this medication and denies any daytime sedation or falls.  He was found to have vitamin B12 168 in September and acknowledges taking his 1059mg supplement daily.  Plan -Refill gabapentin 100 mg

## 2016-03-02 NOTE — Assessment & Plan Note (Signed)
Assessment He was told he had elevated blood sugars in his short term rehab. Review of his labwork over the last year does show multiple readings of glucose >120.  Plan -Check A1c   ADDENDUM 03/02/2016  9:16 AM:  A1c 5.8, reassuring.

## 2016-03-02 NOTE — Assessment & Plan Note (Signed)
Assessment His blood pressure today is elevated at 151/93 on recheck though appears relatively controlled prior to this visit. Given his CAD, goal BP should be <140/90.  Plan -Recheck BP at follow-up with PCP in 2 months and initiate anti-hypertensive therapy at that time if he remains elevated

## 2016-03-02 NOTE — Assessment & Plan Note (Signed)
Assessment Per the rehab facility discharge summary, his INR was 2.7. He is on warfarin 3 mg daily with goal INR 2-3 for paroxysmal atrial fibrillation.   Plan -Check INR today  ADDENDUM 03/02/2016  9:15 AM:  INR 2.8. We will continue current management and will route this office note to Dr. Elie Confer.

## 2016-03-03 ENCOUNTER — Other Ambulatory Visit: Payer: Medicare HMO

## 2016-03-05 ENCOUNTER — Ambulatory Visit
Admission: RE | Admit: 2016-03-05 | Discharge: 2016-03-05 | Disposition: A | Discharge status: Home / Self Care | Payer: Medicare HMO | Source: Ambulatory Visit | Attending: Orthopedic Surgery | Admitting: Orthopedic Surgery

## 2016-03-05 DIAGNOSIS — M50222 Other cervical disc displacement at C5-C6 level: Secondary | ICD-10-CM | POA: Diagnosis not present

## 2016-03-05 DIAGNOSIS — M50223 Other cervical disc displacement at C6-C7 level: Secondary | ICD-10-CM | POA: Diagnosis not present

## 2016-03-05 DIAGNOSIS — M5126 Other intervertebral disc displacement, lumbar region: Secondary | ICD-10-CM | POA: Diagnosis not present

## 2016-03-05 DIAGNOSIS — M545 Low back pain: Secondary | ICD-10-CM

## 2016-03-05 DIAGNOSIS — M542 Cervicalgia: Secondary | ICD-10-CM

## 2016-03-05 DIAGNOSIS — M50221 Other cervical disc displacement at C4-C5 level: Secondary | ICD-10-CM | POA: Diagnosis not present

## 2016-03-05 NOTE — Progress Notes (Signed)
Internal Medicine Clinic Attending  Case discussed with Dr. Patel,Rushil at the time of the visit.  We reviewed the resident's history and exam and pertinent patient test results.  I agree with the assessment, diagnosis, and plan of care documented in the resident's note.  

## 2016-03-10 ENCOUNTER — Other Ambulatory Visit: Payer: Self-pay | Admitting: Nurse Practitioner

## 2016-03-10 DIAGNOSIS — M542 Cervicalgia: Secondary | ICD-10-CM | POA: Diagnosis not present

## 2016-03-10 DIAGNOSIS — M545 Low back pain: Secondary | ICD-10-CM | POA: Diagnosis not present

## 2016-03-10 DIAGNOSIS — M4692 Unspecified inflammatory spondylopathy, cervical region: Secondary | ICD-10-CM | POA: Diagnosis not present

## 2016-03-10 DIAGNOSIS — M4696 Unspecified inflammatory spondylopathy, lumbar region: Secondary | ICD-10-CM | POA: Diagnosis not present

## 2016-03-13 DIAGNOSIS — M47812 Spondylosis without myelopathy or radiculopathy, cervical region: Secondary | ICD-10-CM | POA: Diagnosis not present

## 2016-03-16 DIAGNOSIS — S82122D Displaced fracture of lateral condyle of left tibia, subsequent encounter for closed fracture with routine healing: Secondary | ICD-10-CM | POA: Diagnosis not present

## 2016-03-20 ENCOUNTER — Ambulatory Visit (INDEPENDENT_AMBULATORY_CARE_PROVIDER_SITE_OTHER): Payer: Medicare HMO

## 2016-03-20 DIAGNOSIS — I480 Paroxysmal atrial fibrillation: Secondary | ICD-10-CM

## 2016-03-20 DIAGNOSIS — Z5181 Encounter for therapeutic drug level monitoring: Secondary | ICD-10-CM | POA: Diagnosis not present

## 2016-03-20 LAB — POCT INR: INR: 2.4

## 2016-04-04 DIAGNOSIS — M47812 Spondylosis without myelopathy or radiculopathy, cervical region: Secondary | ICD-10-CM | POA: Diagnosis not present

## 2016-04-17 ENCOUNTER — Ambulatory Visit (INDEPENDENT_AMBULATORY_CARE_PROVIDER_SITE_OTHER): Payer: Medicare HMO

## 2016-04-17 DIAGNOSIS — I480 Paroxysmal atrial fibrillation: Secondary | ICD-10-CM

## 2016-04-17 DIAGNOSIS — Z5181 Encounter for therapeutic drug level monitoring: Secondary | ICD-10-CM | POA: Diagnosis not present

## 2016-04-17 LAB — POCT INR: INR: 1.3

## 2016-04-17 MED ORDER — WARFARIN SODIUM 3 MG PO TABS: ORAL_TABLET | ORAL | 2 refills | Status: DC

## 2016-04-18 ENCOUNTER — Encounter: Payer: Self-pay | Admitting: Internal Medicine

## 2016-04-18 ENCOUNTER — Ambulatory Visit (INDEPENDENT_AMBULATORY_CARE_PROVIDER_SITE_OTHER): Payer: Medicare HMO | Admitting: Internal Medicine

## 2016-04-18 VITALS — BP 150/90 | HR 57 | Temp 97.5°F | Ht 70.0 in | Wt 238.2 lb

## 2016-04-18 DIAGNOSIS — H9319 Tinnitus, unspecified ear: Secondary | ICD-10-CM | POA: Diagnosis not present

## 2016-04-18 DIAGNOSIS — M791 Myalgia: Secondary | ICD-10-CM | POA: Diagnosis not present

## 2016-04-18 DIAGNOSIS — J069 Acute upper respiratory infection, unspecified: Secondary | ICD-10-CM

## 2016-04-18 DIAGNOSIS — J3489 Other specified disorders of nose and nasal sinuses: Secondary | ICD-10-CM

## 2016-04-18 DIAGNOSIS — R030 Elevated blood-pressure reading, without diagnosis of hypertension: Secondary | ICD-10-CM

## 2016-04-18 DIAGNOSIS — R05 Cough: Secondary | ICD-10-CM

## 2016-04-18 DIAGNOSIS — I10 Essential (primary) hypertension: Secondary | ICD-10-CM

## 2016-04-18 DIAGNOSIS — Z87891 Personal history of nicotine dependence: Secondary | ICD-10-CM

## 2016-04-18 NOTE — Progress Notes (Signed)
CC: cough HPI: [name] is a 71 y.o. male with a h/o of CAD, GERD, HTN, prediabetes who presents for URI sx.  Please see Problem-based charting for HPI and the status of patient's chronic medical conditions.  Past Medical History:  Diagnosis Date  . AAA (abdominal aortic aneurysm) ([facility])    a. 2010 s/p stenting  . Arthritis   . Bradycardia   . CAD (coronary artery disease) CABG in 2004   a. 2004 s/p CABG  . Carotid artery disease ([facility])    a. 2012 dopplers with old LICA occlusion , RICA no significant abnormality  . CVA (cerebral infarction)    a. 2013 R Carona radiata stroke   . GERD (gastroesophageal reflux disease)   . Hyperlipidemia   . Hypertension   . Paroxysmal atrial fibrillation ([facility]) 02/22/2009   a. on Coumadin   . Prediabetes   . Renal insufficiency   . Seborrheic dermatitis of scalp   . Tobacco abuse    Social Hx: Social History  Substance Use Topics  . Smoking status: Former Smoker    Packs/day: 0.00    Years: 35.00    Types: Cigarettes    Quit date: 02/03/2010  . Smokeless tobacco: Never Used  . Alcohol use No   Review of Systems: ROS in HPI. Otherwise: Review of Systems  Constitutional: Positive for malaise/fatigue. Negative for chills, fever and weight loss.  HENT: Positive for congestion, sinus pain and tinnitus. Negative for ear discharge, ear pain, hearing loss, nosebleeds and sore throat.   Respiratory: Positive for cough and sputum production. Negative for shortness of breath and wheezing.   Cardiovascular: Negative for chest pain and leg swelling.  Gastrointestinal: Negative for abdominal pain, constipation, diarrhea, nausea and vomiting.  Genitourinary: Negative for dysuria, frequency and urgency.  Musculoskeletal: Positive for myalgias.    Physical Exam: Vitals:   04/18/16 0855  BP: (!) 172/121  Pulse: (!) 57  Temp: 97.5 F (36.4 C)  TempSrc: Oral  SpO2: 99%  Weight: 238 lb 3.2 oz (108 kg)  Height: '5\' 10"'$  (1.778 m)    Physical Exam  Constitutional: He is cooperative. No distress.  HENT:  Right Ear: Tympanic membrane and ear canal normal.  Left Ear: Tympanic membrane and ear canal normal.  Nose: Mucosal edema present. No rhinorrhea. Right sinus exhibits maxillary sinus tenderness. Right sinus exhibits no frontal sinus tenderness. Left sinus exhibits maxillary sinus tenderness. Left sinus exhibits no frontal sinus tenderness.  Mouth/Throat: Posterior oropharyngeal erythema present. No oropharyngeal exudate or posterior oropharyngeal edema.  Cardiovascular: Normal rate, regular rhythm, normal heart sounds and normal pulses.  Exam reveals no gallop.   No murmur heard. Pulmonary/Chest: Effort normal and breath sounds normal. No respiratory distress. He has no wheezes. He has no rhonchi. He has no rales.  Abdominal: Soft. Bowel sounds are normal. There is no tenderness.  Musculoskeletal: He exhibits no edema.  Skin: Skin is intact.    Assessment & Plan:  See encounters tab for problem based medical decision making. Patient discussed with Dr. Eppie Gibson  Hypertension BP elevated today 172/121. Heart rate 57. Patient has a history of mildly elevated blood pressures on previous exams is not currently on antihypertensive therapy. Given his presentation with acute illness today I would recommend that we defer antihypertensive therapy as patient is currently asymptomatic from a blood pressure standpoint. Patient denies any headaches, changes to vision, urinary symptoms, chest pain, short of breath. He has a appointment scheduled with his primary provider in 2 weeks  and recommend that he follow-up at that time, hopefully he will have recovered from this viral process or infection, for further management.  Repeat BP measurement manually was 158/90.  Acute URI Patient was observed 3 day history of cough, and postnasal drip. He also complains of some sinus pain and pressure little bit of intermittent tinnitus and ear  pressure. He denies any significant chest congestion. He notes some mild myalgias of the upper extremities. Denies any nausea, vomiting, diarrhea. Patient reports that he had a sick contact in his landlord whom he saw late last week. Patient was vaccinated with the influenza vaccine last fall.  Plan: Given the patient's history as well as his three-day history is possible that he has the flu or other upper respiratory infection. I would not recommend initiating Tamiflu given that he is alert and symptomatically for 3 days. Would recommend management with over-the-counter remedies such as Mucinex and nasal saline irrigation and nasal steroids.   Signed: Holley Raring, MD 04/18/2016, 9:30 AM  Pager: [phone]

## 2016-04-18 NOTE — Assessment & Plan Note (Addendum)
BP elevated today 172/121. Heart rate 57. Patient has a history of mildly elevated blood pressures on previous exams is not currently on antihypertensive therapy. Given his presentation with acute illness today I would recommend that we defer antihypertensive therapy as patient is currently asymptomatic from a blood pressure standpoint. Patient denies any headaches, changes to vision, urinary symptoms, chest pain, short of breath. He has a appointment scheduled with his primary provider in 2 weeks and recommend that he follow-up at that time, hopefully he will have recovered from this viral process or infection, for further management.  Repeat BP measurement manually was 158/90.

## 2016-04-18 NOTE — Progress Notes (Signed)
Patient ID: [facility], male   DOB: [birth year], 71 y.o.   MRN: [id number]  Case discussed with Dr. Mitzie Na at the time of the visit. We reviewed the resident's history and exam and pertinent patient test results. I agree with the assessment, diagnosis, and plan of care documented in the resident's note.  If the patient's blood pressure remains elevated upon follow-up it can be addressed by his PCP if appropriate.

## 2016-04-18 NOTE — Patient Instructions (Signed)
You have a viral infection of your sinuses and upper respiratory tract. Unfortunately, we do not have any good antibiotics to treat this infection. However, it should resolve on its own in 10-14 days.  In the meantime, there are several good remedies that will treat your symptoms and hopefully help to make you more comfortable. Please look for one of the over-the-counter medications below that will help with your congestion, cough, and post-nasal drip. Additionally, it will be very important to use nasal irrigation and nasal steroid spray to clear you sinus congestion. I have provided a recipe and instructions below.     BUFFERED ISOTONIC SALINE NASAL IRRIGATION  The Benefits:  1. When you irrigate, the isotonic saline (salt water) acts as a solvent and washes the mucus crusts and other debris from your nose.  2. This decongests and improves the airflow into your nose. The sinus passages begin to open.  3. Studies have also shown that a salt water and an alkaline (baking soda) irrigation solution improves nasal membrane cell function (mucociliary flow of mucus debris).  The Recipe:  1. Choose a 1-quart glass jar that is thoroughly cleansed.  2. Fill with sterile or distilled water, or you can boil water from the tap.  3. Add 1 to 2 heaping teaspoons of "pickling/canning/sea" salt (NOT table salt as it contains a large number of additives). This salt is available at the grocery store in the food canning section.  4. Add 1 teaspoon of [employer] Hammer Baking Soda (pure bicarbonate).  5. Mix ingredients together and store at room temperature. Discard after one week. If you find this solution too strong, you may decrease the amount of salt added to 1 to 1  teaspoons. With children it is often best to start with a milder solution and advance slowly. Irrigate with 240 ml (8 oz) twice daily.  The Instructions:  You should plan to irrigate your nose with buffered isotonic saline 2 times per day.  Many people prefer to warm the solution slightly in the microwave - but be sure that the solution is NOT HOT. Stand over the sink (some do this in the shower) and squirt the solution into each side of your nose, keeping your mouth open. This allows you to spit the saltwater out of your mouth. It will not harm you if you swallow a little.  If you have been told to use a nasal steroid such as Flonase, Nasonex, or Nasacort, you should always use isortonic saline solution first, then use your nasal steroid product. The nasal steroid is much more effective when sprayed onto clean nasal membranes and the steroid medicine will reach deeper into the nose.  Most people experience a little burning sensation the first few times they use a isotonic saline solution, but this usually goes away within a few days.

## 2016-04-18 NOTE — Assessment & Plan Note (Signed)
Patient was observed 3 day history of cough, and postnasal drip. He also complains of some sinus pain and pressure little bit of intermittent tinnitus and ear pressure. He denies any significant chest congestion. He notes some mild myalgias of the upper extremities. Denies any nausea, vomiting, diarrhea. Patient reports that he had a sick contact in his landlord whom he saw late last week. Patient was vaccinated with the influenza vaccine last fall.  Plan: Given the patient's history as well as his three-day history is possible that he has the flu or other upper respiratory infection. I would not recommend initiating Tamiflu given that he is alert and symptomatically for 3 days. Would recommend management with over-the-counter remedies such as Mucinex and nasal saline irrigation and nasal steroids.

## 2016-04-28 ENCOUNTER — Ambulatory Visit (INDEPENDENT_AMBULATORY_CARE_PROVIDER_SITE_OTHER): Payer: Medicare HMO

## 2016-04-28 DIAGNOSIS — I480 Paroxysmal atrial fibrillation: Secondary | ICD-10-CM | POA: Diagnosis not present

## 2016-04-28 DIAGNOSIS — Z5181 Encounter for therapeutic drug level monitoring: Secondary | ICD-10-CM | POA: Diagnosis not present

## 2016-04-28 LAB — POCT INR: INR: 1.6

## 2016-05-01 ENCOUNTER — Telehealth: Payer: Self-pay | Admitting: Internal Medicine

## 2016-05-01 NOTE — Telephone Encounter (Signed)
APT. REMINDER CALL, LMTCB °

## 2016-05-02 ENCOUNTER — Ambulatory Visit (INDEPENDENT_AMBULATORY_CARE_PROVIDER_SITE_OTHER): Payer: Medicare HMO | Admitting: Internal Medicine

## 2016-05-02 ENCOUNTER — Encounter: Payer: Self-pay | Admitting: Internal Medicine

## 2016-05-02 VITALS — BP 126/68 | HR 65 | Temp 97.8°F | Ht 70.0 in | Wt 243.7 lb

## 2016-05-02 DIAGNOSIS — I739 Peripheral vascular disease, unspecified: Secondary | ICD-10-CM

## 2016-05-02 DIAGNOSIS — I10 Essential (primary) hypertension: Secondary | ICD-10-CM

## 2016-05-02 DIAGNOSIS — R300 Dysuria: Secondary | ICD-10-CM

## 2016-05-02 DIAGNOSIS — E538 Deficiency of other specified B group vitamins: Secondary | ICD-10-CM | POA: Diagnosis not present

## 2016-05-02 DIAGNOSIS — G629 Polyneuropathy, unspecified: Secondary | ICD-10-CM

## 2016-05-02 DIAGNOSIS — I129 Hypertensive chronic kidney disease with stage 1 through stage 4 chronic kidney disease, or unspecified chronic kidney disease: Secondary | ICD-10-CM | POA: Diagnosis not present

## 2016-05-02 DIAGNOSIS — I2510 Atherosclerotic heart disease of native coronary artery without angina pectoris: Secondary | ICD-10-CM

## 2016-05-02 DIAGNOSIS — Z87891 Personal history of nicotine dependence: Secondary | ICD-10-CM

## 2016-05-02 DIAGNOSIS — N183 Chronic kidney disease, stage 3 unspecified: Secondary | ICD-10-CM

## 2016-05-02 DIAGNOSIS — J069 Acute upper respiratory infection, unspecified: Secondary | ICD-10-CM

## 2016-05-02 DIAGNOSIS — Z79899 Other long term (current) drug therapy: Secondary | ICD-10-CM

## 2016-05-02 DIAGNOSIS — Z7982 Long term (current) use of aspirin: Secondary | ICD-10-CM

## 2016-05-02 DIAGNOSIS — Z8669 Personal history of other diseases of the nervous system and sense organs: Secondary | ICD-10-CM

## 2016-05-02 DIAGNOSIS — I714 Abdominal aortic aneurysm, without rupture, unspecified: Secondary | ICD-10-CM

## 2016-05-02 DIAGNOSIS — I480 Paroxysmal atrial fibrillation: Secondary | ICD-10-CM

## 2016-05-02 DIAGNOSIS — G5603 Carpal tunnel syndrome, bilateral upper limbs: Secondary | ICD-10-CM

## 2016-05-02 DIAGNOSIS — I779 Disorder of arteries and arterioles, unspecified: Secondary | ICD-10-CM

## 2016-05-02 DIAGNOSIS — Z7901 Long term (current) use of anticoagulants: Secondary | ICD-10-CM

## 2016-05-02 DIAGNOSIS — I6523 Occlusion and stenosis of bilateral carotid arteries: Secondary | ICD-10-CM

## 2016-05-02 LAB — POCT URINALYSIS DIPSTICK
Bilirubin, UA: NEGATIVE
Blood, UA: NEGATIVE
Glucose, UA: NEGATIVE
Ketones, UA: NEGATIVE
NITRITE UA: NEGATIVE
PROTEIN UA: NEGATIVE
SPEC GRAV UA: 1.015
UROBILINOGEN UA: 0.2
pH, UA: 6.5

## 2016-05-02 NOTE — Assessment & Plan Note (Addendum)
-   Patient's BP today was 126/68 after resting - He was noted to have elevated BPs on his last 2 encounters but was relatively well controlled prior to that - Will hold off on anti hypertensives for now - I will give him a BP log today and ask him to check his BP at home and follow up with me in 3 months and we will consider starting medications then if his BP is high at home

## 2016-05-02 NOTE — Assessment & Plan Note (Signed)
-   Patient followed up with vascular surgery in April last year - Noted to be stable with no evidence of endovascular leak - He will need to f/u vascular in April this year and obtain a follow up ultrasound - No further w/u for now

## 2016-05-02 NOTE — Assessment & Plan Note (Signed)
-   Patient states symptoms have completely resolved with night time wrist splints - Will continue with night time splints and advised patient to follow up with his neurologist

## 2016-05-02 NOTE — Assessment & Plan Note (Signed)
-   Was seen by vascular last April and will follow up with them again this year - On last follow up L ICA occlusion and 50% R ICA stenosis was stable per vascular notes - Patient to follow up with vascular this year

## 2016-05-02 NOTE — Assessment & Plan Note (Signed)
-   Patient was noted to have B 12 deficiency on his last blood work and was started on B 12 supplementation - Will check B12 levels today  - Continue with B 12 for now

## 2016-05-02 NOTE — Assessment & Plan Note (Signed)
-   Patient with no anginal symptoms - Last stress test was in May 2015 with no ischemia seen - Patient is compliant with asa - Would consider starting a beta blocker if his BP is elevated

## 2016-05-02 NOTE — Assessment & Plan Note (Signed)
-   Patient's symptoms are well controlled on gabapentin - Will continue with this for now - He was noted to have a B 12 deficiency as well which may have been contributing to this and was started on B12 supplementation - He is compliant with this

## 2016-05-02 NOTE — Assessment & Plan Note (Signed)
-   Patient complains of mild burning with urination. Denies fevers, no flank pain - Urine dipstick with only trace leukocytes - Will hold off on abx for now - Will check U/A and urine culture and if positive would consider PO abx treatment

## 2016-05-02 NOTE — Patient Instructions (Addendum)
-   It was a pleasure seeing you today - Please check your BP at home and bring your BP log to your next appointment - We will check some blood work today - We will check a urine test to rule out infection - Please follow up with vascular surgery in April for your aneurysm follow up - Please follow up with your coumadin clinic on Friday

## 2016-05-02 NOTE — Assessment & Plan Note (Signed)
-   Patient's symptoms are now much improved - He only has mild rhinorhea today and feels much better - No further work up for now

## 2016-05-02 NOTE — Assessment & Plan Note (Addendum)
-   Patient has regular rate and rhythm on exam today - Denies palpitations - Patient follows up in cardiology coumadin clinic - Last INR was subtherapeutic at 1.6. He is to follow up with coumadin clinic Friday for INR recheck

## 2016-05-02 NOTE — Progress Notes (Signed)
   Subjective:    Patient ID: Dylan Morgan, male    DOB: May 11, 1945, 71 y.o.   MRN: [id number]  HPI  I have seen and examined the patient. He is here for routine follow up of his HTN and peripheral neuropathy.  He is compliant with his medications. He does state that his peripheral neuropathy is well controlled on his current medications. Patient does complain of burning urination over the last 4-5 days. No fevers, no flank pain, no hematuria.  Of note, he was seen in [facility] recently for a viral URI and was given symptomatic management with nasal saline irrigation and Mucinex and this is now resolving  He states that his carpal tunnel symptoms have completely resolved with the use of night time splints and he denies any numbness or weakness today.   Review of Systems  Constitutional: Negative.   HENT: Positive for rhinorrhea. Negative for congestion, ear pain, postnasal drip, sinus pain, sore throat and voice change.   Respiratory: Negative.   Cardiovascular: Negative.   Gastrointestinal: Negative.   Genitourinary: Positive for dysuria.  Musculoskeletal: Negative.   Neurological: Negative.   Psychiatric/Behavioral: Negative.        Objective:   Physical Exam  Constitutional: He is oriented to person, place, and time. He appears well-developed and well-nourished.  HENT:  Head: Normocephalic and atraumatic.  Mouth/Throat: No oropharyngeal exudate.  Neck: Neck supple.  Cardiovascular: Normal rate, regular rhythm and normal heart sounds.   Pulmonary/Chest: Effort normal and breath sounds normal. No respiratory distress. He has no wheezes.  Abdominal: Soft. Bowel sounds are normal. He exhibits no distension. There is no tenderness.  Musculoskeletal: Normal range of motion. He exhibits no edema.  Lymphadenopathy:    He has no cervical adenopathy.  Neurological: He is alert and oriented to person, place, and time.  Psychiatric: He has a normal mood and affect. His behavior is normal.            Assessment & Plan:  Please see problem based charting for assessment and plan:

## 2016-05-02 NOTE — Assessment & Plan Note (Signed)
-   Last creatinine in November was stable at 1.3 - Will recheck BMP today

## 2016-05-03 ENCOUNTER — Telehealth: Payer: Self-pay | Admitting: Internal Medicine

## 2016-05-03 LAB — URINALYSIS, ROUTINE W REFLEX MICROSCOPIC
BILIRUBIN UA: NEGATIVE
Glucose, UA: NEGATIVE
KETONES UA: NEGATIVE
LEUKOCYTES UA: NEGATIVE
Nitrite, UA: NEGATIVE
PH UA: 6.5 (ref 5.0–7.5)
PROTEIN UA: NEGATIVE
RBC UA: NEGATIVE
SPEC GRAV UA: 1.018 (ref 1.005–1.030)
UUROB: 0.2 mg/dL (ref 0.2–1.0)

## 2016-05-03 LAB — BMP8+ANION GAP
Anion Gap: 19 mmol/L — ABNORMAL HIGH (ref 10.0–18.0)
BUN / CREAT RATIO: 13 (ref 10–24)
BUN: 18 mg/dL (ref 8–27)
CALCIUM: 9.7 mg/dL (ref 8.6–10.2)
CHLORIDE: 100 mmol/L (ref 96–106)
CO2: 25 mmol/L (ref 18–29)
Creatinine, Ser: 1.38 mg/dL — ABNORMAL HIGH (ref 0.76–1.27)
GFR, EST AFRICAN AMERICAN: 59 mL/min/{1.73_m2} — AB (ref >59)
GFR, EST NON AFRICAN AMERICAN: 51 mL/min/{1.73_m2} — AB (ref >59)
GLUCOSE: 104 mg/dL — AB (ref 65–99)
POTASSIUM: 4.8 mmol/L (ref 3.5–5.2)
SODIUM: 144 mmol/L (ref 134–144)

## 2016-05-03 LAB — VITAMIN B12: Vitamin B-12: 328 pg/mL (ref 232–1245)

## 2016-05-03 NOTE — Telephone Encounter (Signed)
I called patient to discuss results of his blood work with him. I explained to him that his creatinine was at his baseline and that his B12 is now within normal limits. I also told him that his urinalysis was normal and there is no evidence of urinary tract infection. Still awaiting results from his urine culture. I will call patient if his urine culture is positive. Patient denies any urinary complaints today and I doubt that he has a urinary tract infection. No further workup for now. Patient expressed understanding and is in agreement with plan.

## 2016-05-04 DIAGNOSIS — I10 Essential (primary) hypertension: Secondary | ICD-10-CM | POA: Diagnosis not present

## 2016-05-04 DIAGNOSIS — Z6835 Body mass index (BMI) 35.0-35.9, adult: Secondary | ICD-10-CM | POA: Diagnosis not present

## 2016-05-04 DIAGNOSIS — Z0000 Encounter for general adult medical examination without abnormal findings: Secondary | ICD-10-CM | POA: Diagnosis not present

## 2016-05-04 DIAGNOSIS — M47011 Anterior spinal artery compression syndromes, occipito-atlanto-axial region: Secondary | ICD-10-CM | POA: Diagnosis not present

## 2016-05-04 DIAGNOSIS — M13161 Monoarthritis, not elsewhere classified, right knee: Secondary | ICD-10-CM | POA: Diagnosis not present

## 2016-05-04 DIAGNOSIS — E669 Obesity, unspecified: Secondary | ICD-10-CM | POA: Diagnosis not present

## 2016-05-04 DIAGNOSIS — M13162 Monoarthritis, not elsewhere classified, left knee: Secondary | ICD-10-CM | POA: Diagnosis not present

## 2016-05-04 DIAGNOSIS — E7800 Pure hypercholesterolemia, unspecified: Secondary | ICD-10-CM | POA: Diagnosis not present

## 2016-05-04 DIAGNOSIS — H5440 Blindness, one eye, unspecified eye: Secondary | ICD-10-CM | POA: Diagnosis not present

## 2016-05-04 DIAGNOSIS — G9009 Other idiopathic peripheral autonomic neuropathy: Secondary | ICD-10-CM | POA: Diagnosis not present

## 2016-05-04 LAB — URINE CULTURE: ORGANISM ID, BACTERIA: NO GROWTH

## 2016-05-05 ENCOUNTER — Ambulatory Visit (INDEPENDENT_AMBULATORY_CARE_PROVIDER_SITE_OTHER): Payer: Medicare HMO

## 2016-05-05 DIAGNOSIS — I480 Paroxysmal atrial fibrillation: Secondary | ICD-10-CM

## 2016-05-05 DIAGNOSIS — Z5181 Encounter for therapeutic drug level monitoring: Secondary | ICD-10-CM | POA: Diagnosis not present

## 2016-05-05 LAB — POCT INR: INR: 2.3

## 2016-05-17 ENCOUNTER — Telehealth: Payer: Self-pay | Admitting: Neurology

## 2016-05-17 ENCOUNTER — Ambulatory Visit: Payer: Medicare HMO | Admitting: Neurology

## 2016-05-17 NOTE — Telephone Encounter (Signed)
This patient did not show for a revisit appointment today. 

## 2016-05-19 ENCOUNTER — Ambulatory Visit (INDEPENDENT_AMBULATORY_CARE_PROVIDER_SITE_OTHER): Payer: Medicare HMO | Admitting: *Deleted

## 2016-05-19 ENCOUNTER — Encounter (HOSPITAL_COMMUNITY): Payer: Self-pay | Admitting: Emergency Medicine

## 2016-05-19 ENCOUNTER — Encounter: Payer: Self-pay | Admitting: Neurology

## 2016-05-19 ENCOUNTER — Emergency Department (HOSPITAL_COMMUNITY)
Admission: EM | Admit: 2016-05-19 | Discharge: 2016-05-19 | Disposition: A | Discharge status: Home / Self Care | Payer: Medicare HMO | Attending: Emergency Medicine | Admitting: Emergency Medicine

## 2016-05-19 DIAGNOSIS — I2510 Atherosclerotic heart disease of native coronary artery without angina pectoris: Secondary | ICD-10-CM | POA: Diagnosis not present

## 2016-05-19 DIAGNOSIS — I480 Paroxysmal atrial fibrillation: Secondary | ICD-10-CM | POA: Diagnosis not present

## 2016-05-19 DIAGNOSIS — L0390 Cellulitis, unspecified: Secondary | ICD-10-CM

## 2016-05-19 DIAGNOSIS — Z7901 Long term (current) use of anticoagulants: Secondary | ICD-10-CM | POA: Insufficient documentation

## 2016-05-19 DIAGNOSIS — L03312 Cellulitis of back [any part except buttock]: Secondary | ICD-10-CM | POA: Diagnosis not present

## 2016-05-19 DIAGNOSIS — N183 Chronic kidney disease, stage 3 (moderate): Secondary | ICD-10-CM | POA: Insufficient documentation

## 2016-05-19 DIAGNOSIS — Z87891 Personal history of nicotine dependence: Secondary | ICD-10-CM | POA: Diagnosis not present

## 2016-05-19 DIAGNOSIS — L089 Local infection of the skin and subcutaneous tissue, unspecified: Secondary | ICD-10-CM

## 2016-05-19 DIAGNOSIS — Z5181 Encounter for therapeutic drug level monitoring: Secondary | ICD-10-CM | POA: Diagnosis not present

## 2016-05-19 DIAGNOSIS — Z8673 Personal history of transient ischemic attack (TIA), and cerebral infarction without residual deficits: Secondary | ICD-10-CM | POA: Insufficient documentation

## 2016-05-19 DIAGNOSIS — Z955 Presence of coronary angioplasty implant and graft: Secondary | ICD-10-CM | POA: Diagnosis not present

## 2016-05-19 DIAGNOSIS — Z7982 Long term (current) use of aspirin: Secondary | ICD-10-CM | POA: Diagnosis not present

## 2016-05-19 DIAGNOSIS — M79602 Pain in left arm: Secondary | ICD-10-CM | POA: Diagnosis present

## 2016-05-19 DIAGNOSIS — T148XXA Other injury of unspecified body region, initial encounter: Secondary | ICD-10-CM

## 2016-05-19 DIAGNOSIS — Z79899 Other long term (current) drug therapy: Secondary | ICD-10-CM | POA: Diagnosis not present

## 2016-05-19 DIAGNOSIS — I129 Hypertensive chronic kidney disease with stage 1 through stage 4 chronic kidney disease, or unspecified chronic kidney disease: Secondary | ICD-10-CM | POA: Insufficient documentation

## 2016-05-19 DIAGNOSIS — L03114 Cellulitis of left upper limb: Secondary | ICD-10-CM | POA: Insufficient documentation

## 2016-05-19 LAB — POCT INR: INR: 1.7

## 2016-05-19 MED ORDER — CEPHALEXIN 500 MG PO CAPS: 500.0000 mg | ORAL_CAPSULE | Freq: Four times a day (QID) | ORAL | 0 refills | Status: DC

## 2016-05-19 NOTE — Discharge Instructions (Signed)
Please take antibiotic as prescribed for skin infection.  Take 650 mg of Tylenol 3 times a day for pain and inflammation. Please read attached information on shingles you know what signs to look for in case your rash changes. Your symptoms should start to improve in the next 2-3 days, return to the emergency department if you develop fevers, your rash becomes bigger or starts to drain pus.

## 2016-05-19 NOTE — ED Triage Notes (Signed)
Pt sts wound or possible insect bite x 3 days; some redness noted

## 2016-05-19 NOTE — ED Provider Notes (Signed)
[facility] DEPT Provider Note   CSN: [id number] Arrival date & time: 05/19/16  6063   By signing my name below, I, Ethelle Lyon Long, attest that this documentation has been prepared under the direction and in the presence of Kinnie Feil, PA-C. Electronically Signed: Ethelle Lyon Long, Scribe. 05/19/2016. 9:59 AM.   History   Chief Complaint Chief Complaint  Patient presents with  . Wound Check    The history is provided by the patient. No language interpreter was used.   HPI Comments:  Dylan Morgan is a 71 y.o. male with a PMHx of Arthritis, AAA, HTN, HLD, and CVA, who presents to the Emergency Department complaining of an area of gradual worsening "burning" pain, redness and swelling to his posterior left arm onset three days. When he showed the area to his sister-in-law, she states she saw a "hole" on his arm. He declines any new travel, time spent outdoors, new detergents, soap, or lotion use. He additionally notes having chicken pox as a kid. Pt also states a similar area of pain to his upper back, though he denies drainage to both areas. Pt denies fever, arthralgias, abdominal pain, nausea, vomiting, diarrhea, myalgias, and any other associated symptoms at this time.  Past Medical History:  Diagnosis Date  . AAA (abdominal aortic aneurysm) ([place])    a. 2010 s/p stenting  . Arthritis   . Bradycardia   . CAD (coronary artery disease) CABG in 2004   a. 2004 s/p CABG  . Carotid artery disease ([place])    a. 2012 dopplers with old LICA occlusion , RICA no significant abnormality  . CVA (cerebral infarction)    a. 2013 R Carona radiata stroke   . GERD (gastroesophageal reflux disease)   . Hyperlipidemia   . Hypertension   . Paroxysmal atrial fibrillation ([place]) 02/22/2009   a. on Coumadin   . Prediabetes   . Renal insufficiency   . Seborrheic dermatitis of scalp   . Tobacco abuse     Patient Active Problem List   Diagnosis Date Noted  . Vitamin B 12 deficiency  05/02/2016  . Burning with urination 05/02/2016  . Closed fracture of left tibial plateau 01/18/2016  . Carpal tunnel syndrome, bilateral 01/11/2016  . S/P AAA (abdominal aortic aneurysm) repair 06/21/2014  . Acute URI 06/19/2014  . Peripheral neuropathy ([place]) 01/16/2014  . Onychomycosis 01/10/2013  . Chronic anticoagulation 05/01/2012  . Neck pain, chronic 03/13/2012  . H/O TIA (transient ischemic attack) and stroke 01/02/2012  . Occlusion and stenosis of carotid artery without mention of cerebral infarction 12/19/2011  . TIA (transient ischemic attack) 11/23/2011  . Preventative health care 09/20/2011  . CVA (cerebral infarction)   . Prediabetes   . Long term (current) use of anticoagulants 08/04/2011  . Carotid artery disease ([place])   . CAD (coronary artery disease)   . Hypertension   . Hyperlipidemia   . Hx of CABG   . AAA (abdominal aortic aneurysm) ([place])   . Erectile dysfunction 02/06/2011  . Paroxysmal atrial fibrillation ([place]) 02/22/2009  . Depression 02/04/2007  . SLEEP APNEA 02/04/2007  . CKD (chronic kidney disease), stage III 02/20/2006    Past Surgical History:  Procedure Laterality Date  . ABDOMINAL AORTIC ANEURYSM REPAIR  2010   Aortic stent repair  . CORONARY ARTERY BYPASS GRAFT  2004  . ROTATOR CUFF REPAIR  1990       Home Medications    Prior to Admission medications   Medication Sig Start Date End  Date Taking? Authorizing Provider  aspirin EC 81 MG tablet Take 81 mg by mouth every other day. After supper    Historical Provider, MD  cephALEXin (KEFLEX) 500 MG capsule Take 1 capsule (500 mg total) by mouth 4 (four) times daily. 05/19/16   Kinnie Feil, PA-C  gabapentin (NEURONTIN) 100 MG capsule Take 1 capsule (100 mg total) by mouth daily. 02/29/16   Riccardo Dubin, MD  Multiple Vitamin (MULTIVITAMIN) tablet Take 1 tablet by mouth daily. 02/29/16   Rushil Sherrye Payor, MD  simvastatin (ZOCOR) 40 MG tablet TAKE ONE TABLET BY MOUTH ONCE DAILY AFTER   SUPPER 02/29/16   Riccardo Dubin, MD  vitamin B-12 (CYANOCOBALAMIN) 1000 MCG tablet Take 1 tablet (1,000 mcg total) by mouth daily. 02/29/16   Riccardo Dubin, MD  warfarin (COUMADIN) 3 MG tablet Take as directed by Coumadin Clinic 04/17/16   Will Meredith Leeds, MD    Family History Family History  Problem Relation Age of Onset  . Diabetes Mother   . Stroke Father   . Thyroid cancer Sister   . Heart disease Brother     Coronary artery disease  . Heart attack Brother   . Heart attack Brother     Social History Social History  Substance Use Topics  . Smoking status: Former Smoker    Packs/day: 0.00    Years: 35.00    Types: Cigarettes    Quit date: 02/03/2010  . Smokeless tobacco: Never Used  . Alcohol use No     Allergies   Diltiazem hcl   Review of Systems Review of Systems  Constitutional: Negative for chills and fever.  HENT: Negative for congestion and sore throat.   Eyes: Negative for visual disturbance.  Respiratory: Negative for cough, chest tightness and shortness of breath.   Cardiovascular: Negative for chest pain.  Gastrointestinal: Negative for abdominal pain, constipation, diarrhea, nausea and vomiting.  Genitourinary: Negative for decreased urine volume and difficulty urinating.  Musculoskeletal: Negative for arthralgias, joint swelling and myalgias.  Skin: Positive for rash (5cm and 2cm lesion to left arm and upper back  ).  Neurological: Negative for dizziness, light-headedness and headaches.     Physical Exam Updated Vital Signs BP (!) 137/101 (BP Location: Right Arm)   Pulse 75   Temp 98.6 F (37 C) (Oral)   Resp 18   SpO2 94%   Physical Exam  Constitutional: He is oriented to person, place, and time. He appears well-developed and well-nourished. No distress.  NAD.  HENT:  Head: Normocephalic and atraumatic.  Right Ear: External ear normal.  Left Ear: External ear normal.  Nose: Nose normal.  Mouth/Throat: Oropharynx is clear and moist.  No oropharyngeal exudate.  Moist mucous membranes.  No nasal mucosa edema. Oropharynx and tonsils pink without erythema, edema, exudates or lesions.  Uvula midline. No trismus.   Eyes: Conjunctivae and EOM are normal. Pupils are equal, round, and reactive to light. No scleral icterus.  Neck: Normal range of motion. Neck supple. No JVD present. No tracheal deviation present.  Cardiovascular: Normal rate, regular rhythm, normal heart sounds and intact distal pulses.   No murmur heard. Pulmonary/Chest: Effort normal and breath sounds normal. He has no wheezes.  Abdominal: Soft. He exhibits no distension. There is no tenderness.  Musculoskeletal: Normal range of motion. He exhibits no deformity.  Lymphadenopathy:    He has no cervical adenopathy.  Neurological: He is alert and oriented to person, place, and time.  Skin: Skin is warm and  dry. Capillary refill takes less than 2 seconds.  5cm circular, macular, erythematous tender lesion with slight central induration and central opening.  No discharge or bleeding noted. 2cm similar circular, macular, erythematous non-tender lesion to the mid upper back.   Psychiatric: He has a normal mood and affect. His behavior is normal. Judgment and thought content normal.  Nursing note and vitals reviewed.      ED Treatments / Results  DIAGNOSTIC STUDIES:  Oxygen Saturation is 94% on RA, adequate by my interpretation.    COORDINATION OF CARE:  9:55 AM Discussed treatment plan with pt at bedside including Abx and pt agreed to plan.  Labs (all labs ordered are listed, but only abnormal results are displayed) Labs Reviewed - No data to display  EKG  EKG Interpretation None       Radiology No results found.  Procedures Procedures (including critical care time)  Medications Ordered in ED Medications - No data to display   Initial Impression / Assessment and Plan / ED Course  I have reviewed the triage vital signs and the nursing  notes.  Pertinent labs & imaging results that were available during my care of the patient were reviewed by me and considered in my medical decision making (see chart for details).     71 year old male presents with 2 erythematous, tender, circular macular lesions to posterior left upper arm and upper middle back onset 3 days ago. Suspect insect bite with secondary cellulitis. Considered shingles however these are lesions and not a dermatomal rash. There is also central hole in both lesions which is not typical of shingles and is most consistent with an insect bite. On exam there was very slight central fluctuance without any drainage, I suspect this is inflammation and not subcutaneous abscess. I don't think incision and drainage is indicated at this point. Will discharge patient with antibiotics to cover cellulitis. Patient given strict ED return precautions, he is aware of red flag symptoms to monitor for that would warrant return to the emergency department.   Final Clinical Impressions(s) / ED Diagnoses   Final diagnoses:  Cellulitis, unspecified cellulitis site  Wound infection    New Prescriptions Discharge Medication List as of 05/19/2016 10:08 AM    START taking these medications   Details  cephALEXin (KEFLEX) 500 MG capsule Take 1 capsule (500 mg total) by mouth 4 (four) times daily., Starting Fri 05/19/2016, Print       I personally performed the services described in this documentation, which was scribed in my presence. The recorded information has been reviewed and is accurate.     Kinnie Feil, PA-C 05/19/16 [place], MD 05/20/16 [phone]

## 2016-06-05 ENCOUNTER — Ambulatory Visit (INDEPENDENT_AMBULATORY_CARE_PROVIDER_SITE_OTHER): Payer: Medicare HMO | Admitting: *Deleted

## 2016-06-05 DIAGNOSIS — Z5181 Encounter for therapeutic drug level monitoring: Secondary | ICD-10-CM

## 2016-06-05 DIAGNOSIS — I480 Paroxysmal atrial fibrillation: Secondary | ICD-10-CM | POA: Diagnosis not present

## 2016-06-05 LAB — POCT INR: INR: 4.1

## 2016-06-16 ENCOUNTER — Ambulatory Visit (INDEPENDENT_AMBULATORY_CARE_PROVIDER_SITE_OTHER): Payer: Medicare HMO | Admitting: Pharmacist

## 2016-06-16 DIAGNOSIS — I480 Paroxysmal atrial fibrillation: Secondary | ICD-10-CM

## 2016-06-16 DIAGNOSIS — Z5181 Encounter for therapeutic drug level monitoring: Secondary | ICD-10-CM

## 2016-06-16 LAB — POCT INR: INR: 2.3

## 2016-06-30 ENCOUNTER — Ambulatory Visit (INDEPENDENT_AMBULATORY_CARE_PROVIDER_SITE_OTHER): Payer: Medicare HMO | Admitting: *Deleted

## 2016-06-30 DIAGNOSIS — Z5181 Encounter for therapeutic drug level monitoring: Secondary | ICD-10-CM

## 2016-06-30 DIAGNOSIS — I480 Paroxysmal atrial fibrillation: Secondary | ICD-10-CM

## 2016-06-30 LAB — POCT INR: INR: 1.1

## 2016-07-01 ENCOUNTER — Emergency Department (HOSPITAL_COMMUNITY)
Admission: EM | Admit: 2016-07-01 | Discharge: 2016-07-02 | Disposition: A | Discharge status: Home / Self Care | Payer: Medicare HMO | Attending: Emergency Medicine | Admitting: Emergency Medicine

## 2016-07-01 ENCOUNTER — Encounter (HOSPITAL_COMMUNITY): Payer: Self-pay

## 2016-07-01 ENCOUNTER — Emergency Department (HOSPITAL_COMMUNITY): Payer: Medicare HMO

## 2016-07-01 DIAGNOSIS — Z7901 Long term (current) use of anticoagulants: Secondary | ICD-10-CM | POA: Insufficient documentation

## 2016-07-01 DIAGNOSIS — Z7982 Long term (current) use of aspirin: Secondary | ICD-10-CM | POA: Diagnosis not present

## 2016-07-01 DIAGNOSIS — R42 Dizziness and giddiness: Secondary | ICD-10-CM

## 2016-07-01 DIAGNOSIS — Z951 Presence of aortocoronary bypass graft: Secondary | ICD-10-CM | POA: Diagnosis not present

## 2016-07-01 DIAGNOSIS — Z87891 Personal history of nicotine dependence: Secondary | ICD-10-CM | POA: Insufficient documentation

## 2016-07-01 DIAGNOSIS — I2510 Atherosclerotic heart disease of native coronary artery without angina pectoris: Secondary | ICD-10-CM | POA: Insufficient documentation

## 2016-07-01 DIAGNOSIS — N183 Chronic kidney disease, stage 3 (moderate): Secondary | ICD-10-CM | POA: Diagnosis not present

## 2016-07-01 DIAGNOSIS — Z8673 Personal history of transient ischemic attack (TIA), and cerebral infarction without residual deficits: Secondary | ICD-10-CM | POA: Insufficient documentation

## 2016-07-01 DIAGNOSIS — I129 Hypertensive chronic kidney disease with stage 1 through stage 4 chronic kidney disease, or unspecified chronic kidney disease: Secondary | ICD-10-CM | POA: Diagnosis not present

## 2016-07-01 LAB — DIFFERENTIAL
BASOS ABS: 0 10*3/uL (ref 0.0–0.1)
Basophils Relative: 0 %
EOS PCT: 2 %
Eosinophils Absolute: 0.1 10*3/uL (ref 0.0–0.7)
LYMPHS ABS: 1.3 10*3/uL (ref 0.7–4.0)
LYMPHS PCT: 27 %
Monocytes Absolute: 0.4 10*3/uL (ref 0.1–1.0)
Monocytes Relative: 8 %
NEUTROS PCT: 63 %
Neutro Abs: 3 10*3/uL (ref 1.7–7.7)

## 2016-07-01 LAB — COMPREHENSIVE METABOLIC PANEL
ALBUMIN: 3.6 g/dL (ref 3.5–5.0)
ALK PHOS: 78 U/L (ref 38–126)
ALT: 19 U/L (ref 17–63)
ANION GAP: 7 (ref 5–15)
AST: 26 U/L (ref 15–41)
BUN: 34 mg/dL — AB (ref 6–20)
CALCIUM: 9.1 mg/dL (ref 8.9–10.3)
CO2: 31 mmol/L (ref 22–32)
Chloride: 104 mmol/L (ref 101–111)
Creatinine, Ser: 1.32 mg/dL — ABNORMAL HIGH (ref 0.61–1.24)
GFR calc Af Amer: >60 mL/min (ref >60)
GFR calc non Af Amer: 52 mL/min — ABNORMAL LOW (ref >60)
GLUCOSE: 134 mg/dL — AB (ref 65–99)
POTASSIUM: 4.2 mmol/L (ref 3.5–5.1)
SODIUM: 135 mmol/L (ref 135–145)
Total Bilirubin: 0.5 mg/dL (ref 0.3–1.2)
Total Protein: 6.8 g/dL (ref 6.5–8.1)

## 2016-07-01 LAB — PROTIME-INR
INR: 1.25
Prothrombin Time: 15.7 seconds — ABNORMAL HIGH (ref 11.4–15.2)

## 2016-07-01 LAB — APTT: APTT: 31 s (ref 24–36)

## 2016-07-01 LAB — I-STAT TROPONIN, ED: Troponin i, poc: 0 ng/mL (ref 0.00–0.08)

## 2016-07-01 LAB — CBC
HCT: 43.3 % (ref 39.0–52.0)
HEMOGLOBIN: 13.9 g/dL (ref 13.0–17.0)
MCH: 28.6 pg (ref 26.0–34.0)
MCHC: 32.1 g/dL (ref 30.0–36.0)
MCV: 89.1 fL (ref 78.0–100.0)
PLATELETS: 138 10*3/uL — AB (ref 150–400)
RBC: 4.86 MIL/uL (ref 4.22–5.81)
RDW: 13.8 % (ref 11.5–15.5)
WBC: 4.8 10*3/uL (ref 4.0–10.5)

## 2016-07-01 MED ORDER — MECLIZINE HCL 25 MG PO TABS
25.0000 mg | ORAL_TABLET | Freq: Once | ORAL | Status: AC
  Administered 2016-07-02: 25 mg via ORAL
  Filled 2016-07-01: qty 1

## 2016-07-01 NOTE — ED Triage Notes (Signed)
Patient reports that he awoke this am with dizziness, difficulty ambulating, and headache. States that he feels generally weak. Nol arm drift, no facial droop. Speech normal per patient. Reports has had stroke in past. Alert and oriented on arrival

## 2016-07-01 NOTE — ED Notes (Signed)
Patient transported to MRI 

## 2016-07-01 NOTE — ED Notes (Signed)
Given patient meal and drink per MD after stroke swallow screen

## 2016-07-01 NOTE — ED Provider Notes (Signed)
[facility] DEPT Provider Note   CSN: [id number] Arrival date & time: 07/01/16  1410     History   Chief Complaint Chief Complaint  Patient presents with  . dizziness/weakness/headache    HPI Dylan Morgan is a 71 y.o. male.  HPI   72 year old male with extensive past medical history as below including known coronary artery disease and CVA who presents with acute onset of dizziness. The patient was in his usual state of health until awakening this morning. He felt mildly dizzy and his bed but when he sat up, he felt acutely more dizzy. He feels as though the room was spinning and he was leaning towards his left side. He sat up to walk then fell due to his dizziness and inability to walk. He had no associated chest pain, difficulty speaking, or difficulty swallowing. Denies any numbness or weakness. He has some chronic numbness in his distal hands and feet that has not worsened. Since then, his dizziness has persisted. It is primarily when sitting upright but has also occurred at rest. He has no history of vertigo. No tinnitus or ear pain. No other medical complaints. No recent falls.  Past Medical History:  Diagnosis Date  . AAA (abdominal aortic aneurysm) ([place])    a. 2010 s/p stenting  . Arthritis   . Bradycardia   . CAD (coronary artery disease) CABG in 2004   a. 2004 s/p CABG  . Carotid artery disease ([place])    a. 2012 dopplers with old LICA occlusion , RICA no significant abnormality  . CVA (cerebral infarction)    a. 2013 R Carona radiata stroke   . GERD (gastroesophageal reflux disease)   . Hyperlipidemia   . Hypertension   . Paroxysmal atrial fibrillation ([place]) 02/22/2009   a. on Coumadin   . Prediabetes   . Renal insufficiency   . Seborrheic dermatitis of scalp   . Tobacco abuse     Patient Active Problem List   Diagnosis Date Noted  . Vitamin B 12 deficiency 05/02/2016  . Burning with urination 05/02/2016  . Closed fracture of left tibial plateau  01/18/2016  . Carpal tunnel syndrome, bilateral 01/11/2016  . S/P AAA (abdominal aortic aneurysm) repair 06/21/2014  . Acute URI 06/19/2014  . Peripheral neuropathy ([place]) 01/16/2014  . Onychomycosis 01/10/2013  . Chronic anticoagulation 05/01/2012  . Neck pain, chronic 03/13/2012  . H/O TIA (transient ischemic attack) and stroke 01/02/2012  . Occlusion and stenosis of carotid artery without mention of cerebral infarction 12/19/2011  . TIA (transient ischemic attack) 11/23/2011  . Preventative health care 09/20/2011  . CVA (cerebral infarction)   . Prediabetes   . Long term (current) use of anticoagulants 08/04/2011  . Carotid artery disease ([place])   . CAD (coronary artery disease)   . Hypertension   . Hyperlipidemia   . Hx of CABG   . AAA (abdominal aortic aneurysm) ([place])   . Erectile dysfunction 02/06/2011  . Paroxysmal atrial fibrillation ([place]) 02/22/2009  . Depression 02/04/2007  . SLEEP APNEA 02/04/2007  . CKD (chronic kidney disease), stage III 02/20/2006    Past Surgical History:  Procedure Laterality Date  . ABDOMINAL AORTIC ANEURYSM REPAIR  2010   Aortic stent repair  . CORONARY ARTERY BYPASS GRAFT  2004  . ROTATOR CUFF REPAIR  1990       Home Medications    Prior to Admission medications   Medication Sig Start Date End Date Taking? Authorizing Provider  aspirin EC 81 MG tablet Take 81  mg by mouth every other day. After supper    Historical Provider, MD  cephALEXin (KEFLEX) 500 MG capsule Take 1 capsule (500 mg total) by mouth 4 (four) times daily. 05/19/16   Kinnie Feil, PA-C  gabapentin (NEURONTIN) 100 MG capsule Take 1 capsule (100 mg total) by mouth daily. 02/29/16   Riccardo Dubin, MD  Multiple Vitamin (MULTIVITAMIN) tablet Take 1 tablet by mouth daily. 02/29/16   Rushil Sherrye Payor, MD  simvastatin (ZOCOR) 40 MG tablet TAKE ONE TABLET BY MOUTH ONCE DAILY AFTER  SUPPER 02/29/16   Riccardo Dubin, MD  vitamin B-12 (CYANOCOBALAMIN) 1000 MCG tablet Take 1  tablet (1,000 mcg total) by mouth daily. 02/29/16   Riccardo Dubin, MD  warfarin (COUMADIN) 3 MG tablet Take as directed by [facility] 04/17/16   Will Meredith Leeds, MD    Family History Family History  Problem Relation Age of Onset  . Diabetes Mother   . Stroke Father   . Thyroid cancer Sister   . Heart disease Brother     Coronary artery disease  . Heart attack Brother   . Heart attack Brother     Social History Social History  Substance Use Topics  . Smoking status: Former Smoker    Packs/day: 0.00    Years: 35.00    Types: Cigarettes    Quit date: 02/03/2010  . Smokeless tobacco: Never Used  . Alcohol use No     Allergies   Diltiazem hcl   Review of Systems Review of Systems  Constitutional: Positive for fatigue. Negative for chills and fever.  HENT: Negative for congestion and rhinorrhea.   Eyes: Negative for visual disturbance.  Respiratory: Negative for cough, shortness of breath and wheezing.   Cardiovascular: Negative for chest pain and leg swelling.  Gastrointestinal: Negative for abdominal pain, diarrhea, nausea and vomiting.  Genitourinary: Negative for dysuria and flank pain.  Musculoskeletal: Negative for neck pain and neck stiffness.  Skin: Negative for rash and wound.  Allergic/Immunologic: Negative for immunocompromised state.  Neurological: Positive for dizziness and light-headedness. Negative for syncope, weakness and headaches.  All other systems reviewed and are negative.    Physical Exam Updated Vital Signs BP (!) 160/116 (BP Location: Right Arm)   Pulse 63   Temp 97.9 F (36.6 C) (Oral)   Resp 18   SpO2 97%   Physical Exam  Constitutional: He is oriented to person, place, and time. He appears well-developed and well-nourished. No distress.  HENT:  Head: Normocephalic and atraumatic.  Mouth/Throat: Oropharynx is clear and moist.  Eyes: Conjunctivae are normal.  Neck: Neck supple.  Cardiovascular: Normal rate, regular rhythm  and normal heart sounds.  Exam reveals no friction rub.   No murmur heard. Pulmonary/Chest: Effort normal and breath sounds normal. No respiratory distress. He has no wheezes. He has no rales.  Abdominal: Soft. He exhibits no distension.  Musculoskeletal: He exhibits no edema.  Neurological: He is alert and oriented to person, place, and time. He exhibits normal muscle tone.  Skin: Skin is warm. Capillary refill takes less than 2 seconds.  Psychiatric: He has a normal mood and affect.  Nursing note and vitals reviewed.   Neurological Exam:  Mental Status: Alert and oriented to person, place, and time. Attention and concentration normal. Speech clear. Recent memory is intact. Cranial Nerves: Visual fields grossly intact. Disconjugate vision/eyes with inward deviation left eye. No nystagmus noted. Facial sensation intact at forehead, maxillary cheek, and chin/mandible bilaterally. No facial asymmetry or  weakness. Hearing grossly normal. Uvula is midline, and palate elevates symmetrically. Normal SCM and trapezius strength. Tongue midline without fasciculations. Motor: Muscle strength 5/5 in proximal and distal UE and LE bilaterally. No pronator drift. Muscle tone normal. Reflexes: 2+ and symmetrical in all four extremities.  Sensation: Intact to light touch in upper and lower extremities distally bilaterally.  Gait: Normal without ataxia. Coordination: Normal FTN bilaterally in right visual fields, mild ataxia when finger on left 2/2 vision loss.      ED Treatments / Results  Labs (all labs ordered are listed, but only abnormal results are displayed) Labs Reviewed  CBC - Abnormal; Notable for the following:       Result Value   Platelets 138 (*)    All other components within normal limits  DIFFERENTIAL  PROTIME-INR  APTT  COMPREHENSIVE METABOLIC PANEL  I-STAT TROPOININ, ED    EKG  EKG Interpretation  Date/Time:  Saturday July 01 2016 14:18:07 EDT Ventricular Rate:  61 PR  Interval:  138 QRS Duration: 100 QT Interval:  422 QTC Calculation: 424 R Axis:   -59 Text Interpretation:  Sinus rhythm with Premature atrial complexes Incomplete right bundle branch block Left anterior fascicular block Possible Lateral infarct , age undetermined Abnormal ECG No STEMI.  Confirmed by LONG MD, JOSHUA [phone]) on 07/01/2016 2:38:06 PM       Radiology No results found.  Procedures Procedures (including critical care time)  Medications Ordered in ED Medications - No data to display   Initial Impression / Assessment and Plan / ED Course  I have reviewed the triage vital signs and the nursing notes.  Pertinent labs & imaging results that were available during my care of the patient were reviewed by me and considered in my medical decision making (see chart for details).     71 year old male with extensive past medical history as above here with primarily positional dizziness and lightheadedness. On arrival, patient is hemodynamically stable. He is mildly hypertensive. Neurological exam is nonfocal. He has chronic disconjugate gaze with no vision in the left eye, which is baseline. He had subjective ataxia on left-sided finger to nose, but this corrects in the right visual field and I suspect is secondary to his blindness. No other cerebellar findings. Initial consideration includes likely orthostasis, versus autonomic dysfunction and patient has additionally reports of bilateral upper and lower extremity tingling consistent with peripheral neuropathy. However, must consider TIA and will check CT head as well as consult neurology.  Discussed with Dr. Leonel Ramsay of Neuro. Given atypical history, will obtain MR Brain/Neck. If negative, can likely d/c home. Pt remains with sx resolved at this time. Labs are o/w very reassuring. No apparent underlying acute medical conditions.  Patient care transferred to Dr. Tomi Bamberger at the end of my shift. Patient presentation, ED course, and plan  of care discussed with review of all pertinent labs and imaging. Please see his/her note for further details regarding further ED course and disposition.   Final Clinical Impressions(s) / ED Diagnoses   Final diagnoses:  Dizziness  Vertigo      Duffy Bruce, MD 07/02/16 [phone]

## 2016-07-01 NOTE — ED Notes (Signed)
Pt assisted to stgand at bedside to void.

## 2016-07-01 NOTE — Consult Note (Addendum)
Neurology Consultation Reason for Consult: Dizziness Referring Physician: Isaac's, C  CC: Dizziness  History is obtained from: Patient  HPI: Dylan Morgan is a 71 y.o. male who woke up this morning with dizziness. He states that when he would stand up, he'll feel lightheaded and have difficulty with walking. Laying down improved things, but did not go away completely. This lasted for most of the morning, multiple hours. It has since resolved.  He also describes tingling of bilateral hands during this as well as a headache, there are no associated photophobia.   LKW: 3/30 prior to bed tpa given?: no, resolve symptoms   ROS: A 14 point ROS was performed and is negative except as noted in the HPI.   Past Medical History:  Diagnosis Date  . AAA (abdominal aortic aneurysm) ([place])    a. 2010 s/p stenting  . Arthritis   . Bradycardia   . CAD (coronary artery disease) CABG in 2004   a. 2004 s/p CABG  . Carotid artery disease ([place])    a. 2012 dopplers with old LICA occlusion , RICA no significant abnormality  . CVA (cerebral infarction)    a. 2013 R Carona radiata stroke   . GERD (gastroesophageal reflux disease)   . Hyperlipidemia   . Hypertension   . Paroxysmal atrial fibrillation ([name]) 02/22/2009   a. on Coumadin   . Prediabetes   . Renal insufficiency   . Seborrheic dermatitis of scalp   . Tobacco abuse      Family History  Problem Relation Age of Onset  . Diabetes Mother   . Stroke Father   . Thyroid cancer Sister   . Heart disease Brother     Coronary artery disease  . Heart attack Brother   . Heart attack Brother      Social History:  reports that he quit smoking about 6 years ago. His smoking use included Cigarettes. He smoked 0.00 packs per day for 35.00 years. He has never used smokeless tobacco. He reports that he does not drink alcohol or use drugs.   Exam: Current vital signs: BP (!) 134/98   Pulse (!) 58   Temp 97.9 F (36.6 C) (Oral)   Resp 14    SpO2 98%  Vital signs in last 24 hours: Temp:  [97.9 F (36.6 C)] 97.9 F (36.6 C) (03/31 1419) Pulse Rate:  [58-73] 58 (03/31 1700) Resp:  [14-18] 14 (03/31 1700) BP: (134-165)/(86-116) 134/98 (03/31 1700) SpO2:  [96 %-98 %] 98 % (03/31 1700)   Physical Exam  Constitutional: Appears well-developed and well-nourished.  Psych: Affect appropriate to situation Eyes: No scleral injection HENT: No OP obstrucion Head: Normocephalic.  Cardiovascular: Normal rate and regular rhythm.  Respiratory: Effort normal and breath sounds normal to anterior ascultation GI: Soft.  No distension. There is no tenderness.  Skin: WDI  Neuro: Mental Status: Patient is awake, alert, oriented to person, place, month, year, and situation. Patient is able to give a clear and coherent history. No signs of aphasia or neglect Cranial Nerves: II: Visual Fields are full, decreased acuity in the left eye. Pupils are equal, round, and reactive to light.   III,IV, VI: EOMI  diploplia. He keeps his left eye closed V: Facial sensation is symmetric to temperature VII: Facial movement is symmetric.  VIII: hearing is intact to voice X: Uvula elevates symmetrically XI: Shoulder shrug is symmetric. XII: tongue is midline without atrophy or fasciculations.  Motor: Tone is normal. Bulk is normal. 5/5 strength was  present in all four extremities.  Sensory: Sensation is symmetric to light touch and temperature in the arms and legs. Cerebellar: He does have mild tremor ontoher bilaterally, reports this is long-standing     I have reviewed labs in epic and the results pertinent to this consultation are: CMP-unremarkable other than borderline creatinine  I have reviewed the images obtained: CT head-negative  Impression: 71 year old male with dizziness this morning. The description of bilateral hand tingling as well as the positional nature make me wonder about orthostasis as opposed to cerebral ischemia. With the  duration of the symptoms, however expected to be some change on MRI if this is due to cerebral ischemia I would favor an MRI. If this is negative, then I think the possibility that he was orthostatic but this improved. Also, unmasking of his previous symptoms (cerebellar infarct) due to headache would be one other possibility.   Recommendations: 1) MRI brain, MRA head and neck 2) stroke workup if positive   Roland Rack, MD [facility] [phone]  If 7pm- 7am, please page neurology on call as listed in [place].

## 2016-07-02 DIAGNOSIS — N183 Chronic kidney disease, stage 3 (moderate): Secondary | ICD-10-CM | POA: Diagnosis not present

## 2016-07-02 DIAGNOSIS — Z951 Presence of aortocoronary bypass graft: Secondary | ICD-10-CM | POA: Diagnosis not present

## 2016-07-02 DIAGNOSIS — Z7901 Long term (current) use of anticoagulants: Secondary | ICD-10-CM | POA: Diagnosis not present

## 2016-07-02 DIAGNOSIS — I2510 Atherosclerotic heart disease of native coronary artery without angina pectoris: Secondary | ICD-10-CM | POA: Diagnosis not present

## 2016-07-02 DIAGNOSIS — I129 Hypertensive chronic kidney disease with stage 1 through stage 4 chronic kidney disease, or unspecified chronic kidney disease: Secondary | ICD-10-CM | POA: Diagnosis not present

## 2016-07-02 DIAGNOSIS — Z8673 Personal history of transient ischemic attack (TIA), and cerebral infarction without residual deficits: Secondary | ICD-10-CM | POA: Diagnosis not present

## 2016-07-02 DIAGNOSIS — R42 Dizziness and giddiness: Secondary | ICD-10-CM | POA: Diagnosis not present

## 2016-07-02 DIAGNOSIS — Z7982 Long term (current) use of aspirin: Secondary | ICD-10-CM | POA: Diagnosis not present

## 2016-07-02 DIAGNOSIS — Z87891 Personal history of nicotine dependence: Secondary | ICD-10-CM | POA: Diagnosis not present

## 2016-07-02 MED ORDER — MECLIZINE HCL 25 MG PO TABS: 25.0000 mg | ORAL_TABLET | Freq: Three times a day (TID) | ORAL | 0 refills | Status: DC | PRN

## 2016-07-02 MED ORDER — ONDANSETRON HCL 4 MG PO TABS: 4.0000 mg | ORAL_TABLET | Freq: Three times a day (TID) | ORAL | 0 refills | Status: DC | PRN

## 2016-07-02 NOTE — ED Provider Notes (Signed)
By signing my name below, I, Soijett Blue, attest that this documentation has been prepared under the direction and in the presence of Rolland Porter, MD. Electronically Signed: Soijett Blue, ED Scribe. 07/02/16. 1:30 AM.    HPI Comments: Pt left at shift change awaiting MRI/MRA brain results.   Dylan Morgan is a 71 y.o. male with a PMHx of CAD, HTN, who presents to the Emergency Department complaining of dizziness onset yesterday morning. Pt reports associated gait problem, HA, and generalized weakness. Pt has not tried any medications for the relief of his symptoms.  He notes that he his symptoms have resolved at this time and he is eating comfortably. He denies any other symptoms. Pt PCP is located at the outpatient clinic.  Discussed results and treatment of meclizine Rx with pt.    DIAGNOSTIC STUDIES: Oxygen Saturation is 97% on RA, nl by my interpretation.    Ct Head Wo Contrast  Result Date: 07/01/2016 CLINICAL DATA:  Weakness and dizziness since this morning. EXAM: CT HEAD WITHOUT CONTRAST TECHNIQUE: Contiguous axial images were obtained from the base of the skull through the vertex without intravenous contrast. COMPARISON:  Brain MR dated 11/24/2011 and head CT dated 11/23/2011. FINDINGS: Brain: Diffusely enlarged ventricles and subarachnoid spaces. Patchy white matter low density in both cerebral hemispheres. Old right cerebellar hemisphere infarct. No intracranial hemorrhage, mass lesion or CT evidence of acute infarction. Vascular: No hyperdense vessel or unexpected calcification. Skull: Normal. Negative for fracture or focal lesion. Sinuses/Orbits: No acute finding.  Stable left globe of banding. Other: Bilateral concha bullosa, greater on the right, with deviation of the mid nasal septum to the left. IMPRESSION: 1. No acute abnormality. 2. Progressive atrophy and chronic small vessel white matter ischemic changes in both cerebral hemispheres. 3. Interval old right cerebellar hemisphere  infarct. Electronically Signed   By: Claudie Revering M.D.   On: 07/01/2016 16:06   Mr Dylan Morgan Head Wo Contrast Mr Brain Wo Contrast  Result Date: 07/02/2016 CLINICAL DATA:  Dizziness and lightheadedness today, difficulty walking. Symptoms now resolved. Assess for stroke. History of hypertension, hyperlipidemia, stroke, LEFT internal carotid artery occlusion. EXAM: MRI HEAD WITHOUT CONTRAST MRA HEAD WITHOUT CONTRAST TECHNIQUE: Multiplanar, multiecho pulse sequences of the brain and surrounding structures were obtained without intravenous contrast. Angiographic images of the head were obtained using MRA technique without contrast. COMPARISON:  MRI and MRA head November 24, 2011 and CT HEAD July 01, 2016 FINDINGS: MRI HEAD FINDINGS- mildly motion degraded examination. BRAIN: No reduced diffusion to suggest acute ischemia. No susceptibility artifact to suggest hemorrhage. The ventricles and sulci are normal for patient's age. Old small RIGHT cerebellar infarct. Confluent supratentorial white matter T2 hyperintensities. No suspicious parenchymal signal, masses or mass effect. No abnormal extra-axial fluid collections ; similar prominent bifrontal extra-axial spaces. VASCULAR: T2 bright signal along course of LEFT internal carotid artery consistent with occlusion, unchanged from 2013. SKULL AND UPPER CERVICAL SPINE: No abnormal sellar expansion. No suspicious calvarial bone marrow signal. Craniocervical junction maintained. SINUSES/ORBITS: The mastoid air-cells and included paranasal sinuses are well-aerated. The included ocular globes and orbital contents are non-suspicious. Bilateral ocular lens implants and LEFT scleral banding. OTHER: Patient is edentulous. MRA HEAD FINDINGS- moderately motion degraded examination. ANTERIOR CIRCULATION: Complete loss of LEFT internal carotid artery flow related enhancement. Normal flow related enhancement RIGHT internal carotid artery. Patent anterior communicating artery. Patent though  slow flow LEFT middle cerebral artery without severe stenosis. Normal flow related enhancement bilateral anterior cerebral arteries and RIGHT middle cerebral artery  without severe stenosis. POSTERIOR CIRCULATION: LEFT vertebral artery is dominant. Flow related enhancement bilateral vertebral arteries, basilar artery and. Fetal origin RIGHT posterior cerebral artery with LEFT posterior communicating artery present. Bilateral posterior cerebral arteries are patent without severe stent. ANATOMIC VARIANTS: None. IMPRESSION: MRI HEAD: No acute intracranial process on this mildly motion degraded examination. Old small RIGHT cerebellar infarct and severe chronic small vessel ischemic disease. MRA HEAD: No emergent large vessel occlusion or severe stenosis on this moderately motion degraded examination. Chronically occluded LEFT internal carotid artery. Complete circle of Willis. Electronically Signed   By: Elon Alas M.D.   On: 07/02/2016 01:08      COORDINATION OF CARE: 1:30 AM Discussed treatment plan with pt at bedside which includes meclizine Rx and pt agreed to plan.   Diagnoses that have been ruled out:  None  Diagnoses that are still under consideration:  None  Final diagnoses:  Dizziness  Vertigo   New Prescriptions   MECLIZINE (ANTIVERT) 25 MG TABLET    Take 1 tablet (25 mg total) by mouth 3 (three) times daily as needed for dizziness.   ONDANSETRON (ZOFRAN) 4 MG TABLET    Take 1 tablet (4 mg total) by mouth every 8 (eight) hours as needed for nausea or vomiting.    Plan discharge  Rolland Porter, MD, FACEP   I personally performed the services described in this documentation, which was scribed in my presence. The recorded information has been reviewed and considered.  Rolland Porter, MD, Barbette Or, MD 07/02/16 [phone]

## 2016-07-02 NOTE — ED Notes (Signed)
Pt understood dc material. NAD noted. Scripts given at dc 

## 2016-07-02 NOTE — Discharge Instructions (Signed)
Take the meclizine as needed for dizziness. Use the zofran for nausea or vomiting. Recheck if you get worse such as a headache, falling, worsening dizziness. NO DRIVING, OR DOING ANY ACTIVITY THAT YOU COULD GET HURT IF YOU HAD A DIZZY SPELL HAPPEN!

## 2016-07-04 ENCOUNTER — Encounter: Payer: Self-pay | Admitting: Internal Medicine

## 2016-07-04 ENCOUNTER — Telehealth: Payer: Self-pay | Admitting: *Deleted

## 2016-07-04 ENCOUNTER — Ambulatory Visit (INDEPENDENT_AMBULATORY_CARE_PROVIDER_SITE_OTHER): Payer: Medicare HMO | Admitting: Internal Medicine

## 2016-07-04 DIAGNOSIS — Z79899 Other long term (current) drug therapy: Secondary | ICD-10-CM

## 2016-07-04 DIAGNOSIS — H8112 Benign paroxysmal vertigo, left ear: Secondary | ICD-10-CM

## 2016-07-04 DIAGNOSIS — Z8673 Personal history of transient ischemic attack (TIA), and cerebral infarction without residual deficits: Secondary | ICD-10-CM

## 2016-07-04 DIAGNOSIS — Z87891 Personal history of nicotine dependence: Secondary | ICD-10-CM

## 2016-07-04 DIAGNOSIS — I10 Essential (primary) hypertension: Secondary | ICD-10-CM | POA: Diagnosis not present

## 2016-07-04 MED ORDER — LISINOPRIL 10 MG PO TABS: 10.0000 mg | ORAL_TABLET | Freq: Every day | ORAL | 0 refills | Status: DC

## 2016-07-04 NOTE — Progress Notes (Signed)
   CC: Dizziness  HPI:  [name] is a 71 y.o. male with a past medical history listed below here today with complaints of dizziness.   Past Medical History:  Diagnosis Date  . AAA (abdominal aortic aneurysm) ([place])    a. 2010 s/p stenting  . Arthritis   . Bradycardia   . CAD (coronary artery disease) CABG in 2004   a. 2004 s/p CABG  . Carotid artery disease ([place])    a. 2012 dopplers with old LICA occlusion , RICA no significant abnormality  . CVA (cerebral infarction)    a. 2013 R Carona radiata stroke   . GERD (gastroesophageal reflux disease)   . Hyperlipidemia   . Hypertension   . Paroxysmal atrial fibrillation ([place]) 02/22/2009   a. on Coumadin   . Prediabetes   . Renal insufficiency   . Seborrheic dermatitis of scalp   . Tobacco abuse    Review of Systems:   See HPI  Physical Exam:  Vitals:   07/04/16 1350 07/04/16 1352  BP: (!) 162/88 (!) 149/81  Pulse: 64 71  Temp: 97.9 F (36.6 C)   TempSrc: Oral   SpO2: 98%   Weight: 241 lb 9.6 oz (109.6 kg)   Height: '5\' 10"'$  (1.778 m)    Physical Exam  Constitutional: He is well-developed, well-nourished, and in no distress.  Cardiovascular: Normal rate and regular rhythm.   Pulmonary/Chest: Effort normal and breath sounds normal.  Neurological:  Dix-Hallpike negative but difficult due to patient cooperation with exam maneuver. Symptomatic when performing maneuver.    Assessment & Plan:   See Encounters Tab for problem based charting.  Patient discussed with Dr. Eppie Gibson

## 2016-07-04 NOTE — Assessment & Plan Note (Signed)
  Mr. Rathman was recently seen in the ED 3/31 overnight with complaints of acute onset dizziness. At that time he reported difficulty walking, headache and generalized weakness. He had dizziness while in bed with worsening when he sat up. Tried to walk but fell due to his dizziness. Did not have any acute weakness or numbness. Neuro exam was intact at that time. Labs including cmet and cbc unremarkable at that time. He had a head CT without contrast without acute abnormalities. It did demonstrate an interval old right cerebellar hemisphere infarct. MRI/MRA brian was done and showed no acute intracranial abnormality, an old small right cerebellar infarct with severe chronic small vessel ischemic disease and a chronically occluded left ICA. He was discharged from the ED with a prescription for meclizine.   Says that last Thursday he stood up and felt dizzy like the room was spinning. Continued Friday and went to the ED. Says that when he leaned his head back he would get dizzy again. Says he filled the prescription of meclizine and has had improvement in his symptoms. Still having dizziness with standing quickly. Says that he continues to have dizziness when he rolls over in bed. Says symptoms last only a minute a two and returns back to baseline. No fevers or chills.   Attempted Dix-Hallpike today but was difficult due to patient's cooperation with maneuver. Was symptomatic and proceeded with Epely maneuver. Symptoms resolved with the epley maneuver.   Assessment: BPPV  Plan: Provided patient with instructions for Epely maneuver if symptoms recur at home. Instructed him to stop the Meclizine as it has no benefit and can cause drowsiness and put him at risk for falls.

## 2016-07-04 NOTE — Telephone Encounter (Signed)
WALK IN Pt states he has a horrible inner ear problem and needs to be seen, does not want ED, could we add him on to [facility] this pm?

## 2016-07-04 NOTE — Progress Notes (Signed)
Case discussed with Dr. Boswell at the time of the visit. We reviewed the resident's history and exam and pertinent patient test results. I agree with the assessment, diagnosis, and plan of care documented in the resident's note. 

## 2016-07-04 NOTE — Assessment & Plan Note (Signed)
BP Readings from Last 3 Encounters:  07/04/16 (!) 149/81  07/02/16 (!) 154/90  05/19/16 (!) 137/101    Lab Results  Component Value Date   NA 135 07/01/2016   K 4.2 07/01/2016   CREATININE 1.32 (H) 07/01/2016   Dylan Morgan has a history of elevated BP which has resolved in the past on repeat BP for the most part. BP today was initially 168/81 and improved to 149/81. Given his co-morbidities his goal BP should be <140/90. Denies any headache, vision changes or CP today.  Assessment: Worsened HTN  Plan: Will start lisinopril 10 mg daily today.  RTC in 2 weeks for BP recheck and labs

## 2016-07-04 NOTE — Telephone Encounter (Signed)
Please try and add him if possible

## 2016-07-04 NOTE — Patient Instructions (Addendum)
Mr. Trefz,   It was a pleasure meeting you today. You have what we can Benign positional vertigo. The meclizine will not help with this and can be sedating so I would advise not taking this medication. There are maneuvers to help reposition the stone in your ear canal to help with the dizziness which I have provided below.  Your blood pressure has been elevated so I would like to start on you a medicine for this called Lisinopril.   I would like to see you back in 2 weeks for follow up for your blood pressure and lab work up.    Benign Positional Vertigo Vertigo is the feeling that you or your surroundings are moving when they are not. Benign positional vertigo is the most common form of vertigo. The cause of this condition is not serious (is benign). This condition is triggered by certain movements and positions (is positional). This condition can be dangerous if it occurs while you are doing something that could endanger you or others, such as driving. What are the causes? In many cases, the cause of this condition is not known. It may be caused by a disturbance in an area of the inner ear that helps your brain to sense movement and balance. This disturbance can be caused by a viral infection (labyrinthitis), head injury, or repetitive motion. What increases the risk? This condition is more likely to develop in:  Women.  People who are 73 years of age or older. What are the signs or symptoms? Symptoms of this condition usually happen when you move your head or your eyes in different directions. Symptoms may start suddenly, and they usually last for less than a minute. Symptoms may include:  Loss of balance and falling.  Feeling like you are spinning or moving.  Feeling like your surroundings are spinning or moving.  Nausea and vomiting.  Blurred vision.  Dizziness.  Involuntary eye movement (nystagmus). Symptoms can be mild and cause only slight annoyance, or they can be severe  and interfere with daily life. Episodes of benign positional vertigo may return (recur) over time, and they may be triggered by certain movements. Symptoms may improve over time. How is this diagnosed? This condition is usually diagnosed by medical history and a physical exam of the head, neck, and ears. You may be referred to a health care provider who specializes in ear, nose, and throat (ENT) problems (otolaryngologist) or a provider who specializes in disorders of the nervous system (neurologist). You may have additional testing, including:  MRI.  A CT scan.  Eye movement tests. Your health care provider may ask you to change positions quickly while he or she watches you for symptoms of benign positional vertigo, such as nystagmus. Eye movement may be tested with an electronystagmogram (ENG), caloric stimulation, the Dix-Hallpike test, or the roll test.  An electroencephalogram (EEG). This records electrical activity in your brain.  Hearing tests. How is this treated? Usually, your health care provider will treat this by moving your head in specific positions to adjust your inner ear back to normal. Surgery may be needed in severe cases, but this is rare. In some cases, benign positional vertigo may resolve on its own in 2-4 weeks. Follow these instructions at home: Safety   Move slowly.Avoid sudden body or head movements.  Avoid driving.  Avoid operating heavy machinery.  Avoid doing any tasks that would be dangerous to you or others if a vertigo episode would occur.  If you have trouble  walking or keeping your balance, try using a cane for stability. If you feel dizzy or unstable, sit down right away.  Return to your normal activities as told by your health care provider. Ask your health care provider what activities are safe for you. General instructions   Take over-the-counter and prescription medicines only as told by your health care provider.  Avoid certain positions or  movements as told by your health care provider.  Drink enough fluid to keep your urine clear or pale yellow.  Keep all follow-up visits as told by your health care provider. This is important. Contact a health care provider if:  You have a fever.  Your condition gets worse or you develop new symptoms.  Your family or friends notice any behavioral changes.  Your nausea or vomiting gets worse.  You have numbness or a "pins and needles" sensation. Get help right away if:  You have difficulty speaking or moving.  You are always dizzy.  You faint.  You develop severe headaches.  You have weakness in your legs or arms.  You have changes in your hearing or vision.  You develop a stiff neck.  You develop sensitivity to light. This information is not intended to replace advice given to you by your health care provider. Make sure you discuss any questions you have with your health care provider. Document Released: 12/26/2005 Document Revised: 08/26/2015 Document Reviewed: 07/13/2014 Elsevier Interactive Patient Education  2017 [place].  How to Perform the Epley Maneuver The Epley maneuver is an exercise that relieves symptoms of vertigo. Vertigo is the feeling that you or your surroundings are moving when they are not. When you feel vertigo, you may feel like the room is spinning and have trouble walking. Dizziness is a little different than vertigo. When you are dizzy, you may feel unsteady or light-headed. You can do this maneuver at home whenever you have symptoms of vertigo. You can do it up to 3 times a day until your symptoms go away. Even though the Epley maneuver may relieve your vertigo for a few weeks, it is possible that your symptoms will return. This maneuver relieves vertigo, but it does not relieve dizziness. What are the risks? If it is done correctly, the Epley maneuver is considered safe. Sometimes it can lead to dizziness or nausea that goes away after a short  time. If you develop other symptoms, such as changes in vision, weakness, or numbness, stop doing the maneuver and call your health care provider. How to perform the Epley maneuver 1. Sit on the edge of a bed or table with your back straight and your legs extended or hanging over the edge of the bed or table. 2. Turn your head halfway toward the affected ear or side. 3. Lie backward quickly with your head turned until you are lying flat on your back. You may want to position a pillow under your shoulders. 4. Hold this position for 30 seconds. You may experience an attack of vertigo. This is normal. 5. Turn your head to the opposite direction until your unaffected ear is facing the floor. 6. Hold this position for 30 seconds. You may experience an attack of vertigo. This is normal. Hold this position until the vertigo stops. 7. Turn your whole body to the same side as your head. Hold for another 30 seconds. 8. Sit back up. You can repeat this exercise up to 3 times a day. Follow these instructions at home:  After doing the Epley  maneuver, you can return to your normal activities.  Ask your health care provider if there is anything you should do at home to prevent vertigo. He or she may recommend that you:  Keep your head raised (elevated) with two or more pillows while you sleep.  Do not sleep on the side of your affected ear.  Get up slowly from bed.  Avoid sudden movements during the day.  Avoid extreme head movement, like looking up or bending over. Contact a health care provider if:  Your vertigo gets worse.  You have other symptoms, including:  Nausea.  Vomiting.  Headache. Get help right away if:  You have vision changes.  You have a severe or worsening headache or neck pain.  You cannot stop vomiting.  You have new numbness or weakness in any part of your body. Summary  Vertigo is the feeling that you or your surroundings are moving when they are not.  The  Epley maneuver is an exercise that relieves symptoms of vertigo.  If the Epley maneuver is done correctly, it is considered safe. You can do it up to 3 times a day. This information is not intended to replace advice given to you by your health care provider. Make sure you discuss any questions you have with your health care provider. Document Released: 03/25/2013 Document Revised: 02/08/2016 Document Reviewed: 02/08/2016 Elsevier Interactive Patient Education  2017 [employer].

## 2016-07-07 ENCOUNTER — Ambulatory Visit (INDEPENDENT_AMBULATORY_CARE_PROVIDER_SITE_OTHER): Payer: Medicare HMO

## 2016-07-07 DIAGNOSIS — Z5181 Encounter for therapeutic drug level monitoring: Secondary | ICD-10-CM

## 2016-07-07 DIAGNOSIS — I480 Paroxysmal atrial fibrillation: Secondary | ICD-10-CM

## 2016-07-07 LAB — POCT INR: INR: 2.6

## 2016-07-08 ENCOUNTER — Observation Stay (HOSPITAL_COMMUNITY)
Admission: EM | Admit: 2016-07-08 | Discharge: 2016-07-09 | Disposition: A | Discharge status: Home / Self Care | Payer: Medicare HMO | Attending: Oncology | Admitting: Oncology

## 2016-07-08 ENCOUNTER — Encounter (HOSPITAL_COMMUNITY): Payer: Self-pay | Admitting: Emergency Medicine

## 2016-07-08 ENCOUNTER — Emergency Department (HOSPITAL_COMMUNITY): Payer: Medicare HMO

## 2016-07-08 DIAGNOSIS — Z951 Presence of aortocoronary bypass graft: Secondary | ICD-10-CM

## 2016-07-08 DIAGNOSIS — E785 Hyperlipidemia, unspecified: Secondary | ICD-10-CM | POA: Diagnosis present

## 2016-07-08 DIAGNOSIS — R0902 Hypoxemia: Secondary | ICD-10-CM | POA: Diagnosis not present

## 2016-07-08 DIAGNOSIS — E538 Deficiency of other specified B group vitamins: Secondary | ICD-10-CM | POA: Insufficient documentation

## 2016-07-08 DIAGNOSIS — I2510 Atherosclerotic heart disease of native coronary artery without angina pectoris: Secondary | ICD-10-CM | POA: Diagnosis not present

## 2016-07-08 DIAGNOSIS — Z66 Do not resuscitate: Secondary | ICD-10-CM | POA: Insufficient documentation

## 2016-07-08 DIAGNOSIS — J069 Acute upper respiratory infection, unspecified: Principal | ICD-10-CM | POA: Insufficient documentation

## 2016-07-08 DIAGNOSIS — Z87891 Personal history of nicotine dependence: Secondary | ICD-10-CM | POA: Insufficient documentation

## 2016-07-08 DIAGNOSIS — I129 Hypertensive chronic kidney disease with stage 1 through stage 4 chronic kidney disease, or unspecified chronic kidney disease: Secondary | ICD-10-CM | POA: Insufficient documentation

## 2016-07-08 DIAGNOSIS — Z7982 Long term (current) use of aspirin: Secondary | ICD-10-CM | POA: Diagnosis not present

## 2016-07-08 DIAGNOSIS — G4730 Sleep apnea, unspecified: Secondary | ICD-10-CM | POA: Insufficient documentation

## 2016-07-08 DIAGNOSIS — I4510 Unspecified right bundle-branch block: Secondary | ICD-10-CM | POA: Insufficient documentation

## 2016-07-08 DIAGNOSIS — I480 Paroxysmal atrial fibrillation: Secondary | ICD-10-CM | POA: Diagnosis present

## 2016-07-08 DIAGNOSIS — J441 Chronic obstructive pulmonary disease with (acute) exacerbation: Secondary | ICD-10-CM | POA: Diagnosis not present

## 2016-07-08 DIAGNOSIS — I482 Chronic atrial fibrillation: Secondary | ICD-10-CM | POA: Insufficient documentation

## 2016-07-08 DIAGNOSIS — N183 Chronic kidney disease, stage 3 unspecified: Secondary | ICD-10-CM | POA: Diagnosis present

## 2016-07-08 DIAGNOSIS — B9789 Other viral agents as the cause of diseases classified elsewhere: Secondary | ICD-10-CM

## 2016-07-08 DIAGNOSIS — I739 Peripheral vascular disease, unspecified: Secondary | ICD-10-CM

## 2016-07-08 DIAGNOSIS — I10 Essential (primary) hypertension: Secondary | ICD-10-CM | POA: Diagnosis present

## 2016-07-08 DIAGNOSIS — J449 Chronic obstructive pulmonary disease, unspecified: Secondary | ICD-10-CM | POA: Insufficient documentation

## 2016-07-08 DIAGNOSIS — Z79899 Other long term (current) drug therapy: Secondary | ICD-10-CM | POA: Insufficient documentation

## 2016-07-08 DIAGNOSIS — R0602 Shortness of breath: Secondary | ICD-10-CM | POA: Diagnosis present

## 2016-07-08 DIAGNOSIS — R05 Cough: Secondary | ICD-10-CM | POA: Diagnosis not present

## 2016-07-08 DIAGNOSIS — Z8673 Personal history of transient ischemic attack (TIA), and cerebral infarction without residual deficits: Secondary | ICD-10-CM | POA: Insufficient documentation

## 2016-07-08 DIAGNOSIS — Z7901 Long term (current) use of anticoagulants: Secondary | ICD-10-CM | POA: Diagnosis not present

## 2016-07-08 DIAGNOSIS — I779 Disorder of arteries and arterioles, unspecified: Secondary | ICD-10-CM | POA: Diagnosis present

## 2016-07-08 LAB — PROTIME-INR
INR: 2.49
Prothrombin Time: 27.4 seconds — ABNORMAL HIGH (ref 11.4–15.2)

## 2016-07-08 LAB — CBC
HCT: 41 % (ref 39.0–52.0)
Hemoglobin: 13.4 g/dL (ref 13.0–17.0)
MCH: 29.5 pg (ref 26.0–34.0)
MCHC: 32.7 g/dL (ref 30.0–36.0)
MCV: 90.3 fL (ref 78.0–100.0)
PLATELETS: 147 10*3/uL — AB (ref 150–400)
RBC: 4.54 MIL/uL (ref 4.22–5.81)
RDW: 13.7 % (ref 11.5–15.5)
WBC: 6.9 10*3/uL (ref 4.0–10.5)

## 2016-07-08 LAB — COMPREHENSIVE METABOLIC PANEL
ALK PHOS: 77 U/L (ref 38–126)
ALT: 18 U/L (ref 17–63)
AST: 25 U/L (ref 15–41)
Albumin: 3.5 g/dL (ref 3.5–5.0)
Anion gap: 12 (ref 5–15)
BILIRUBIN TOTAL: 0.7 mg/dL (ref 0.3–1.2)
BUN: 22 mg/dL — ABNORMAL HIGH (ref 6–20)
CALCIUM: 9.1 mg/dL (ref 8.9–10.3)
CO2: 24 mmol/L (ref 22–32)
CREATININE: 1.42 mg/dL — AB (ref 0.61–1.24)
Chloride: 105 mmol/L (ref 101–111)
GFR, EST AFRICAN AMERICAN: 55 mL/min — AB (ref >60)
GFR, EST NON AFRICAN AMERICAN: 47 mL/min — AB (ref >60)
Glucose, Bld: 123 mg/dL — ABNORMAL HIGH (ref 65–99)
Potassium: 4.3 mmol/L (ref 3.5–5.1)
Sodium: 141 mmol/L (ref 135–145)
Total Protein: 6.9 g/dL (ref 6.5–8.1)

## 2016-07-08 LAB — I-STAT TROPONIN, ED: TROPONIN I, POC: 0 ng/mL (ref 0.00–0.08)

## 2016-07-08 LAB — BRAIN NATRIURETIC PEPTIDE: B Natriuretic Peptide: 340.9 pg/mL — ABNORMAL HIGH (ref 0.0–100.0)

## 2016-07-08 LAB — MRSA PCR SCREENING: MRSA BY PCR: NEGATIVE

## 2016-07-08 MED ORDER — ALBUTEROL SULFATE (2.5 MG/3ML) 0.083% IN NEBU: 2.5000 mg | INHALATION_SOLUTION | RESPIRATORY_TRACT | Status: DC | PRN

## 2016-07-08 MED ORDER — WARFARIN - PHARMACIST DOSING INPATIENT: Freq: Every day | Status: DC

## 2016-07-08 MED ORDER — ASPIRIN EC 81 MG PO TBEC
81.0000 mg | DELAYED_RELEASE_TABLET | Freq: Every day | ORAL | Status: DC
  Filled 2016-07-08: qty 1

## 2016-07-08 MED ORDER — ALBUTEROL SULFATE (2.5 MG/3ML) 0.083% IN NEBU
5.0000 mg | INHALATION_SOLUTION | Freq: Once | RESPIRATORY_TRACT | Status: AC
  Administered 2016-07-08: 5 mg via RESPIRATORY_TRACT

## 2016-07-08 MED ORDER — GUAIFENESIN ER 600 MG PO TB12
600.0000 mg | ORAL_TABLET | Freq: Two times a day (BID) | ORAL | Status: DC
  Administered 2016-07-09 (×2): 600 mg via ORAL
  Filled 2016-07-08 (×2): qty 1

## 2016-07-08 MED ORDER — ALBUTEROL SULFATE (2.5 MG/3ML) 0.083% IN NEBU
INHALATION_SOLUTION | RESPIRATORY_TRACT | Status: AC
  Filled 2016-07-08: qty 6

## 2016-07-08 MED ORDER — VITAMIN B-12 1000 MCG PO TABS
1000.0000 ug | ORAL_TABLET | Freq: Every day | ORAL | Status: DC
  Administered 2016-07-08: 1000 ug via ORAL
  Filled 2016-07-08: qty 1

## 2016-07-08 MED ORDER — IPRATROPIUM-ALBUTEROL 0.5-2.5 (3) MG/3ML IN SOLN
3.0000 mL | Freq: Four times a day (QID) | RESPIRATORY_TRACT | Status: DC
  Administered 2016-07-08: 3 mL via RESPIRATORY_TRACT
  Filled 2016-07-08 (×2): qty 3

## 2016-07-08 MED ORDER — LISINOPRIL 10 MG PO TABS
10.0000 mg | ORAL_TABLET | Freq: Every day | ORAL | Status: DC
  Administered 2016-07-09: 10 mg via ORAL
  Filled 2016-07-08: qty 1

## 2016-07-08 MED ORDER — ADULT MULTIVITAMIN W/MINERALS CH
1.0000 | ORAL_TABLET | Freq: Every day | ORAL | Status: DC
  Administered 2016-07-09: 1 via ORAL
  Filled 2016-07-08: qty 1

## 2016-07-08 MED ORDER — ACETAMINOPHEN 325 MG PO TABS: 650.0000 mg | ORAL_TABLET | Freq: Four times a day (QID) | ORAL | Status: DC | PRN

## 2016-07-08 MED ORDER — ALBUTEROL (5 MG/ML) CONTINUOUS INHALATION SOLN
10.0000 mg/h | INHALATION_SOLUTION | RESPIRATORY_TRACT | Status: DC
  Administered 2016-07-08: 10 mg/h via RESPIRATORY_TRACT
  Filled 2016-07-08: qty 20

## 2016-07-08 MED ORDER — SIMVASTATIN 40 MG PO TABS
40.0000 mg | ORAL_TABLET | Freq: Every day | ORAL | Status: DC
  Administered 2016-07-08: 40 mg via ORAL
  Filled 2016-07-08: qty 1

## 2016-07-08 MED ORDER — ENOXAPARIN SODIUM 40 MG/0.4ML ~~LOC~~ SOLN: 40.0000 mg | SUBCUTANEOUS | Status: DC

## 2016-07-08 MED ORDER — ACETAMINOPHEN 650 MG RE SUPP: 650.0000 mg | Freq: Four times a day (QID) | RECTAL | Status: DC | PRN

## 2016-07-08 MED ORDER — METHYLPREDNISOLONE SODIUM SUCC 125 MG IJ SOLR
125.0000 mg | Freq: Once | INTRAMUSCULAR | Status: AC
  Administered 2016-07-08: 125 mg via INTRAVENOUS
  Filled 2016-07-08: qty 2

## 2016-07-08 NOTE — ED Notes (Signed)
Unsuccessful IV attempt.

## 2016-07-08 NOTE — Progress Notes (Signed)
ANTICOAGULATION CONSULT NOTE - Initial Consult  Pharmacy Consult for Warfarin Indication: atrial fibrillation  Allergies  Allergen Reactions  . Diltiazem Hcl Itching    Patient Measurements:   Heparin Dosing Weight:   Vital Signs: Temp: 98 F (36.7 C) (04/07 1327) Temp Source: Oral (04/07 1327) BP: 114/61 (04/07 1630) Pulse Rate: 72 (04/07 1630)  Labs:  Recent Labs  07/07/16 0845 07/08/16 1218 07/08/16 1727  HGB  --  13.4  --   HCT  --  41.0  --   PLT  --  147*  --   LABPROT  --   --  27.4*  INR 2.6  --  2.49  CREATININE  --  1.42*  --     Estimated Creatinine Clearance: 57.4 mL/min (A) (by C-G formula based on SCr of 1.42 mg/dL (H)).   Medical History: Past Medical History:  Diagnosis Date  . AAA (abdominal aortic aneurysm) ([place])    a. 2010 s/p stenting  . Arthritis   . Bradycardia   . CAD (coronary artery disease) CABG in 2004   a. 2004 s/p CABG  . Carotid artery disease ([place])    a. 2012 dopplers with old LICA occlusion , RICA no significant abnormality  . CVA (cerebral infarction)    a. 2013 R Carona radiata stroke   . GERD (gastroesophageal reflux disease)   . Hyperlipidemia   . Hypertension   . Paroxysmal atrial fibrillation ([place]) 02/22/2009   a. on Coumadin   . Prediabetes   . Renal insufficiency   . Seborrheic dermatitis of scalp   . Tobacco abuse     Medications:   (Not in a hospital admission) Scheduled:  . albuterol      . [START ON 07/09/2016] aspirin EC  81 mg Oral QPC supper  . guaiFENesin  600 mg Oral BID  . ipratropium-albuterol  3 mL Nebulization Q6H  . [START ON 07/09/2016] lisinopril  10 mg Oral Daily  . [START ON 07/09/2016] multivitamin with minerals  1 tablet Oral Daily  . simvastatin  40 mg Oral QPC supper  . vitamin B-12  1,000 mcg Oral QPC supper   Infusions:  . albuterol 10 mg/hr (07/08/16 1320)    Assessment: 71yo male with history of Afib on warfarin PTA presents with SOB. Pharmacy is consulted to dose warfarin for  atrial fibrillation.   Home Warfarin Dose: '3mg'$  Tues and 4.'5mg'$  AODs with last dose 4/7  Goal of Therapy:  INR 2-3 Monitor platelets by anticoagulation protocol: Yes   Plan:  Hold additional warfarin dose tonight Daily INR Monitor s/sx of bleeding  Andrey Cota. Diona Foley, PharmD, BCPS Clinical Pharmacist [phone] 07/08/2016,5:23 PM

## 2016-07-08 NOTE — Progress Notes (Signed)
Dylan Morgan [id number] Admission Data: 07/08/2016 6:23 PM Attending Provider: Annia Belt, MD  [id number], Dylan Citrin, MD Consults/ Treatment Team:   Dylan Morgan is a 71 y.o. male patient admitted from ED awake, alert  & orientated  X 3,  DNR, VSS - Blood pressure (!) 145/86, pulse 83, temperature 98.3 F (36.8 C), temperature source Oral, resp. rate 20, height '5\' 11"'$  (1.803 m), weight 111 kg (244 lb 12.8 oz), SpO2 97 %., O2    2 L nasal cannular, no c/o shortness of breath, no c/o chest pain, no distress noted.  IV site WDL:  antecubital right, condition patent and no redness with a transparent dsg that's clean dry and intact.  Allergies:   Allergies  Allergen Reactions  . Diltiazem Hcl Itching     Past Medical History:  Diagnosis Date  . AAA (abdominal aortic aneurysm) ([place])    a. 2010 s/p stenting  . Arthritis   . Bradycardia   . CAD (coronary artery disease) CABG in 2004   a. 2004 s/p CABG  . Carotid artery disease ([place])    a. 2012 dopplers with old LICA occlusion , RICA no significant abnormality  . CVA (cerebral infarction)    a. 2013 R Carona radiata stroke   . GERD (gastroesophageal reflux disease)   . Hyperlipidemia   . Hypertension   . Paroxysmal atrial fibrillation ([place]) 02/22/2009   a. on Coumadin   . Prediabetes   . Renal insufficiency   . Seborrheic dermatitis of scalp   . Tobacco abuse     History:  obtained from the patient. Tobacco/alcohol: Former smoker Pt orientation to unit, room and routine. Information packet given to patient/family and safety video watched.  Admission INP armband ID verified with patient/family, and in place. SR up x 2, fall risk assessment complete with Patient and family verbalizing understanding of risks associated with falls. Pt verbalizes an understanding of how to use the call bell and to call for help before getting out of bed.  Skin, clean-dry- intact without evidence of bruising, or skin tears.   No evidence of skin  break down noted on exam. no rashes, no ecchymoses, no petechiae    Will cont to monitor and assist as needed.  Houa Ackert Margaretha Sheffield, RN 07/08/2016 6:23 PM

## 2016-07-08 NOTE — H&P (Signed)
Date: 07/08/2016               Patient Name:  Dylan Morgan MRN: [id number]  DOB: May 12, 1945 Age / Sex: 71 y.o., male   PCP: Aldine Contes, MD         Medical Service: Internal Medicine Teaching Service         Attending Physician: Dr. Annia Belt, MD    First Contact: Dr. Velna Ochs Pager: [phone]  Second Contact: Dr. Zada Finders  Pager: [phone]       After Hours (After 5p/  First Contact Pager: [phone]  weekends / holidays): Second Contact Pager: [phone]   Chief Complaint: Cough   History of Present Illness: Patient is a 71 year old male past medical history significant for hypertension, hyperlipidemia, CKD stage III, CAD (status post CABG), AAA, paroxysmal A. fib (on warfarin), and CVA here with cough and SOB x 1 day. Patient reports that he was in his usual state of health until yesterday when he developed symptoms of URI (cough, congestion,and  rhinorrhea). He denies fevers at home. Recent sick contacts include a son who has a cold. Per chart review, patient received his flu shot in the internal medicine clinic on 12/07/2015. He reports that he woke up in the middle of the night with a severe coughing attack. During this coughing attack, he had phlegm stuck in the back of his throat that could not clear. He says he was coughing so hard he couldn't catch his breath. After a coughing attack, patient thought to himself that he should go to the ED in the morning to be evaluated, and went back to sleep. This morning, he continues to have cough productive of thick white sputum. The patient does not have a formal COPD diagnosis (no PFTs on file, not documented on problem list), but he endorses wheezing and says he has "smoker's lung". He is a former smoker and reports a 30-pack-year history (quit 2011). He denies orthopnea, recent weight gain and lower extremity swelling.    On arrival to the ED, patient was afebrile with temperature 98 and hemodynamically stable blood  pressure 114/61, heart rate 72, respiratory rate 14, and oxygen 98% on room air. On admission evaluation, patient was not wearing his nasal cannula due to irritating his nose. He was breathing comfortably on room air with oxygen saturations in the high 90s. CMP was notable only for an elevated creatinine of 1.4 at his baseline. CBC was unremarkable (WBC 6.9). I-STAT troponin 0.00. BNP was mildly elevated, at 340. Chest x-ray was unrevealing. No evidence of pulmonary edema, infiltrate, consolidation. Patient did endorse some mild chest tightness on initial ED arrival, that resolved with nebulized albuterol.   Meds:  Current Meds  Medication Sig  . aspirin EC 81 MG tablet Take 81 mg by mouth daily after supper.   Marland Kitchen lisinopril (PRINIVIL,ZESTRIL) 10 MG tablet Take 1 tablet (10 mg total) by mouth daily.  . Multiple Vitamin (MULTIVITAMIN) tablet Take 1 tablet by mouth daily.  . Omega-3 Fatty Acids (FISH OIL) 1000 MG CAPS Take 1,000 mg by mouth daily after supper.  . simvastatin (ZOCOR) 40 MG tablet TAKE ONE TABLET BY MOUTH ONCE DAILY AFTER  SUPPER (Patient taking differently: Take 40 mg by mouth daily after supper. )  . vitamin B-12 (CYANOCOBALAMIN) 1000 MCG tablet Take 1 tablet (1,000 mcg total) by mouth daily. (Patient taking differently: Take 1,000 mcg by mouth daily after supper. )  . warfarin (COUMADIN) 3 MG tablet Take  as directed by Coumadin Clinic (Patient taking differently: Take 3-6 mg by mouth See admin instructions. 4.5 mg every day except on Tuesday patient takes 3 mg.)     Allergies: Allergies as of 07/08/2016 - Review Complete 07/08/2016  Allergen Reaction Noted  . Diltiazem hcl Itching    Past Medical History:  Diagnosis Date  . AAA (abdominal aortic aneurysm) ([place])    a. 2010 s/p stenting  . Arthritis   . Bradycardia   . CAD (coronary artery disease) CABG in 2004   a. 2004 s/p CABG  . Carotid artery disease ([place])    a. 2012 dopplers with old LICA occlusion , RICA no  significant abnormality  . CVA (cerebral infarction)    a. 2013 R Carona radiata stroke   . GERD (gastroesophageal reflux disease)   . Hyperlipidemia   . Hypertension   . Paroxysmal atrial fibrillation ([place]) 02/22/2009   a. on Coumadin   . Prediabetes   . Renal insufficiency   . Seborrheic dermatitis of scalp   . Tobacco abuse     Family History: Father deceased from a stroke. "Hospice killed my Mom". Mom with DM.   Social History: Former smoker, quit in 2011. 30-pack-year history. Denies alcohol and illicit drug use.  Review of Systems: A complete ROS was negative except as per HPI.   Physical Exam: Blood pressure 114/61, pulse 72, temperature 98 F (36.7 C), temperature source Oral, resp. rate 14, SpO2 97 %. Constitutional: Laying flat in bed in NAD, breathing comfortable on room air  HEENT: Atraumatic, normocephalic. PERRL, anicteric sclera.  Neck: Supple, trachea midline.  Cardiovascular: RRR, no murmurs, rubs, or gallops.  Pulmonary/Chest: Expiratory wheezing bilaterally   Abdominal: Soft, non tender, non distended. +BS.  Extremities: Warm and well perfused. Nonpitting lower extremity edema  Neurological: A&Ox3, CN II - XII grossly intact.  Skin: No rashes or erythema  Psychiatric: Normal mood and affect   EKG: Personally reviewed. Normal sinus rhythm. Low voltage QRS. Incomplete right bundle branch block. Inverted T waves in V1, V2, V3. Overall unchanged from prior tracing on 07/01/2016.  CXR: Personally reviewed. Normal CXR.  Assessment & Plan by Problem:  Patient is a 71 year old male past medical history significant for hypertension, hyperlipidemia, CKD stage III, CAD (status post CABG), AAA, paroxysmal A. fib (on warfarin), and CVA here with cough, wheezing, and SOB x 1 day, likely due to viral URI.   Viral URI: Patient endorses symptoms of productive cough, congestion/rhinorrhea, and wheezing that started yesterday. He denies fevers and received a flu shot in  September. Sick contacts include his son who has a cold. Patient decided to come to the ED after severe coughing attack overnight during which he could not catch his breath. Patient is currently afebrile, chest x-ray is clear, and labs are negative for signs of infection (leukocytosis 6.9). He is maintaining oxygen saturation of 98% on room air.  He does have a productive cough and is moderately wheezing on exam despite breathing treatments in the ED. He does not have a formal COPD diagnosis but is a former chronic smoker (30 pack year history). Quit smoking in 2011. He would likely benefit from outpatient PFTs once he has recovered from this acute illness. S/p albuterol neb x 2, and IV solumedrol 125 mg in the ED.  -- Duonebs q6 scheduled -- Albuterol nebs q2 prn for wheezing  -- Mucinex BID -- Outpatient PFTs once recovered from URI   HTN: -- Continue home lisinopril 10 mg daily  Hx  CAD, CVA: -- Continue ASA 81 daily   Paroxysmal Atrial Fibrillation: -- Continue warfarin   Hx Vit B-12 Deficiency: -- Continue home B-12 supplements    FEN: No fluids, replete lytes prn, Heart healthy diet  VTE ppx: Warfarin  Code Status: DNR    Dispo: Admit patient to Observation with expected length of stay less than 2 midnights.  Signed: Velna Ochs, MD 07/08/2016, 5:13 PM  Pager: [phone]

## 2016-07-08 NOTE — ED Triage Notes (Signed)
Pt to ER for evaluation of shortness of breath with productive cough and body aches onset yesterday, also complaining of chest tightness. Wheezing noted throughout. Hx of COPD, cardiac hx. A/o x4. RA sats 90%, placed on 2L, does not wear daily O2.

## 2016-07-08 NOTE — Progress Notes (Signed)
Received report from Valarie Merino, [place] in ED

## 2016-07-08 NOTE — ED Provider Notes (Signed)
[facility] DEPT Provider Note   CSN: [id number] Arrival date & time: 07/08/16  1153     History   Chief Complaint Chief Complaint  Patient presents with  . Shortness of Breath    HPI CHAIS FEHRINGER is a 71 y.o. male.  HPI  Patient presents with generalized discomfort and fatigue as well as new dyspnea. Patient has a history of COPD, is a former smoker. He notes that over the past 3 or 4 days he has felt progressively weaker. During this time he has had increasing dyspnea as well. No new fever, chills, nausea, vomiting, no chest pain. No clear alleviating or exacerbating factors. Patient was seen by his physician a few days ago, notes that since that time his symptoms have developed.  Past Medical History:  Diagnosis Date  . AAA (abdominal aortic aneurysm) ([place])    a. 2010 s/p stenting  . Arthritis   . Bradycardia   . CAD (coronary artery disease) CABG in 2004   a. 2004 s/p CABG  . Carotid artery disease ([place])    a. 2012 dopplers with old LICA occlusion , RICA no significant abnormality  . CVA (cerebral infarction)    a. 2013 R Carona radiata stroke   . GERD (gastroesophageal reflux disease)   . Hyperlipidemia   . Hypertension   . Paroxysmal atrial fibrillation ([place]) 02/22/2009   a. on Coumadin   . Prediabetes   . Renal insufficiency   . Seborrheic dermatitis of scalp   . Tobacco abuse     Patient Active Problem List   Diagnosis Date Noted  . BPPV (benign paroxysmal positional vertigo), left 07/04/2016  . Vitamin B 12 deficiency 05/02/2016  . Burning with urination 05/02/2016  . Closed fracture of left tibial plateau 01/18/2016  . Carpal tunnel syndrome, bilateral 01/11/2016  . S/P AAA (abdominal aortic aneurysm) repair 06/21/2014  . Acute URI 06/19/2014  . Peripheral neuropathy ([place]) 01/16/2014  . Onychomycosis 01/10/2013  . Chronic anticoagulation 05/01/2012  . Neck pain, chronic 03/13/2012  . H/O TIA (transient ischemic attack) and stroke  01/02/2012  . Occlusion and stenosis of carotid artery without mention of cerebral infarction 12/19/2011  . TIA (transient ischemic attack) 11/23/2011  . Preventative health care 09/20/2011  . CVA (cerebral infarction)   . Prediabetes   . Long term (current) use of anticoagulants 08/04/2011  . Carotid artery disease ([place])   . CAD (coronary artery disease)   . Hypertension   . Hyperlipidemia   . Hx of CABG   . AAA (abdominal aortic aneurysm) ([place])   . Erectile dysfunction 02/06/2011  . Paroxysmal atrial fibrillation ([place]) 02/22/2009  . Depression 02/04/2007  . SLEEP APNEA 02/04/2007  . CKD (chronic kidney disease), stage III 02/20/2006    Past Surgical History:  Procedure Laterality Date  . ABDOMINAL AORTIC ANEURYSM REPAIR  2010   Aortic stent repair  . CORONARY ARTERY BYPASS GRAFT  2004  . ROTATOR CUFF REPAIR  1990       Home Medications    Prior to Admission medications   Medication Sig Start Date End Date Taking? Authorizing Provider  lisinopril (PRINIVIL,ZESTRIL) 10 MG tablet Take 1 tablet (10 mg total) by mouth daily. 07/04/16 07/04/17 Yes Maryellen Pile, MD  Multiple Vitamin (MULTIVITAMIN) tablet Take 1 tablet by mouth daily. 02/29/16  Yes Rushil Sherrye Payor, MD  aspirin EC 81 MG tablet Take 81 mg by mouth daily after supper.     Historical Provider, MD  gabapentin (NEURONTIN) 100 MG capsule Take 1  capsule (100 mg total) by mouth daily. Patient not taking: Reported on 07/01/2016 02/29/16   Riccardo Dubin, MD  Omega-3 Fatty Acids (FISH OIL) 1000 MG CAPS Take 1,000 mg by mouth daily after supper.    Historical Provider, MD  ondansetron (ZOFRAN) 4 MG tablet Take 1 tablet (4 mg total) by mouth every 8 (eight) hours as needed for nausea or vomiting. Patient not taking: Reported on 07/08/2016 07/02/16   Rolland Porter, MD  simvastatin (ZOCOR) 40 MG tablet TAKE ONE TABLET BY MOUTH ONCE DAILY AFTER  SUPPER Patient taking differently: Take 40 mg by mouth daily after supper.  02/29/16   R[place]il  Sherrye Payor, MD  vitamin B-12 (CYANOCOBALAMIN) 1000 MCG tablet Take 1 tablet (1,000 mcg total) by mouth daily. Patient taking differently: Take 1,000 mcg by mouth daily after supper.  02/29/16   Riccardo Dubin, MD  warfarin (COUMADIN) 3 MG tablet Take as directed by Coumadin Clinic Patient taking differently: Take 3-6 mg by mouth See admin instructions. 1.5 Monday, Tuesday 1.5 3 mg on Wednesday Thursday 1.5. Friday 1.5 04/17/16   Will Meredith Leeds, MD    Family History Family History  Problem Relation Age of Onset  . Diabetes Mother   . Stroke Father   . Thyroid cancer Sister   . Heart disease Brother     Coronary artery disease  . Heart attack Brother   . Heart attack Brother     Social History Social History  Substance Use Topics  . Smoking status: Former Smoker    Packs/day: 0.00    Years: 35.00    Types: Cigarettes    Quit date: 02/03/2010  . Smokeless tobacco: Never Used  . Alcohol use No     Allergies   Diltiazem hcl   Review of Systems Review of Systems  Constitutional:       Per HPI, otherwise negative  HENT:       Per HPI, otherwise negative  Respiratory:       Per HPI, otherwise negative  Cardiovascular:       Per HPI, otherwise negative  Gastrointestinal: Negative for vomiting.  Endocrine:       Negative aside from HPI  Genitourinary:       Neg aside from HPI   Musculoskeletal:       Per HPI, otherwise negative  Skin: Negative.   Neurological: Negative for syncope.     Physical Exam Updated Vital Signs BP 104/77   Pulse (!) 34   Temp 98 F (36.7 C) (Oral)   Resp 12   SpO2 99%   Physical Exam  Constitutional: He is oriented to person, place, and time. He appears well-developed. No distress.  HENT:  Head: Normocephalic and atraumatic.  Eyes: Conjunctivae and EOM are normal.  Cardiovascular: Normal rate and regular rhythm.   Pulmonary/Chest: No stridor. Tachypnea noted. He has decreased breath sounds.  Abdominal: He exhibits no distension.   Musculoskeletal: He exhibits no edema.  Neurological: He is alert and oriented to person, place, and time.  Skin: Skin is warm and dry.  Psychiatric: He has a normal mood and affect.  Nursing note and vitals reviewed.    ED Treatments / Results  Labs (all labs ordered are listed, but only abnormal results are displayed) Labs Reviewed  COMPREHENSIVE METABOLIC PANEL - Abnormal; Notable for the following:       Result Value   Glucose, Bld 123 (*)    BUN 22 (*)    Creatinine, Ser 1.42 (*)  GFR calc non Af Amer 47 (*)    GFR calc Af Amer 55 (*)    All other components within normal limits  CBC - Abnormal; Notable for the following:    Platelets 147 (*)    All other components within normal limits  BRAIN NATRIURETIC PEPTIDE - Abnormal; Notable for the following:    B Natriuretic Peptide 340.9 (*)    All other components within normal limits  I-STAT TROPOININ, ED  Patient requires 2 L nasal cannula for appropriate oxygen saturation of 100%, abnormal   EKG  EKG Interpretation  Date/Time:  Saturday July 08 2016 12:18:11 EDT Ventricular Rate:  71 PR Interval:  154 QRS Duration: 98 QT Interval:  384 QTC Calculation: 417 R Axis:   -60 Text Interpretation:  Normal sinus rhythm Low voltage QRS Incomplete right bundle branch block T wave abnormality Artifact Abnormal ekg Confirmed by Carmin Muskrat  MD [id number]) on 07/08/2016 1:37:17 PM       Radiology Dg Chest 2 View  Result Date: 07/08/2016 CLINICAL DATA:  71 year old male history of coughing EXAM: CHEST  2 VIEW COMPARISON:  09/30/2015, chest CT 12/15/2015 FINDINGS: Similar appearance of chronic interstitial opacities. No pleural effusion or pneumothorax. No confluent airspace disease. Low lung volumes persist. Cardiomediastinal silhouette is unchanged. Calcifications of the aortic arch. Surgical changes of prior median sternotomy and CABG. Vascular stent of the abdomen, for repair of abdominal aortic aneurysm IMPRESSION: Chronic  lung change without definite evidence of superimposed acute cardiopulmonary disease. Surgical changes of median sternotomy and CABG. Electronically Signed   By: Corrie Mckusick D.O.   On: 07/08/2016 13:28    Procedures Procedures (including critical care time)  Medications Ordered in ED Medications  albuterol (PROVENTIL) (2.5 MG/3ML) 0.083% nebulizer solution (  Not Given 07/08/16 1302)  methylPREDNISolone sodium succinate (SOLU-MEDROL) 125 mg/2 mL injection 125 mg (not administered)  albuterol (PROVENTIL,VENTOLIN) solution continuous neb (10 mg/hr Nebulization New Bag/Given 07/08/16 1257)  albuterol (PROVENTIL) (2.5 MG/3ML) 0.083% nebulizer solution 5 mg (5 mg Nebulization Given 07/08/16 1218)    After 2 individual albuterol sessions, one continuous treatment, the patient continues to have tachypnea, increased work of breathing. With elevated BNP, no reported history of congestive heart failure, patient will require admission for further evaluation and management of this likely multifactorial dyspnea, with separation of COPD, CHF, possible viral illness.    Initial Impression / Assessment and Plan / ED Course  I have reviewed the triage vital signs and the nursing notes.  Pertinent labs & imaging results that were available during my care of the patient were reviewed by me and considered in my medical decision making (see chart for details).    Final Clinical Impressions(s) / ED Diagnoses  Dyspnea Hypoxia    Carmin Muskrat, MD 07/08/16 1500

## 2016-07-09 DIAGNOSIS — I129 Hypertensive chronic kidney disease with stage 1 through stage 4 chronic kidney disease, or unspecified chronic kidney disease: Secondary | ICD-10-CM | POA: Diagnosis not present

## 2016-07-09 DIAGNOSIS — E785 Hyperlipidemia, unspecified: Secondary | ICD-10-CM | POA: Diagnosis not present

## 2016-07-09 DIAGNOSIS — R7989 Other specified abnormal findings of blood chemistry: Secondary | ICD-10-CM | POA: Diagnosis not present

## 2016-07-09 DIAGNOSIS — Z833 Family history of diabetes mellitus: Secondary | ICD-10-CM

## 2016-07-09 DIAGNOSIS — Z7901 Long term (current) use of anticoagulants: Secondary | ICD-10-CM

## 2016-07-09 DIAGNOSIS — I482 Chronic atrial fibrillation: Secondary | ICD-10-CM

## 2016-07-09 DIAGNOSIS — Z888 Allergy status to other drugs, medicaments and biological substances status: Secondary | ICD-10-CM

## 2016-07-09 DIAGNOSIS — I2510 Atherosclerotic heart disease of native coronary artery without angina pectoris: Secondary | ICD-10-CM | POA: Diagnosis not present

## 2016-07-09 DIAGNOSIS — Z7982 Long term (current) use of aspirin: Secondary | ICD-10-CM

## 2016-07-09 DIAGNOSIS — Z8673 Personal history of transient ischemic attack (TIA), and cerebral infarction without residual deficits: Secondary | ICD-10-CM

## 2016-07-09 DIAGNOSIS — Z79899 Other long term (current) drug therapy: Secondary | ICD-10-CM

## 2016-07-09 DIAGNOSIS — Z951 Presence of aortocoronary bypass graft: Secondary | ICD-10-CM | POA: Diagnosis not present

## 2016-07-09 DIAGNOSIS — E538 Deficiency of other specified B group vitamins: Secondary | ICD-10-CM | POA: Diagnosis not present

## 2016-07-09 DIAGNOSIS — Z87891 Personal history of nicotine dependence: Secondary | ICD-10-CM

## 2016-07-09 DIAGNOSIS — J441 Chronic obstructive pulmonary disease with (acute) exacerbation: Secondary | ICD-10-CM

## 2016-07-09 DIAGNOSIS — J069 Acute upper respiratory infection, unspecified: Secondary | ICD-10-CM | POA: Diagnosis not present

## 2016-07-09 DIAGNOSIS — N183 Chronic kidney disease, stage 3 (moderate): Secondary | ICD-10-CM

## 2016-07-09 DIAGNOSIS — Z823 Family history of stroke: Secondary | ICD-10-CM

## 2016-07-09 LAB — BASIC METABOLIC PANEL
ANION GAP: 13 (ref 5–15)
BUN: 21 mg/dL — ABNORMAL HIGH (ref 6–20)
CALCIUM: 9 mg/dL (ref 8.9–10.3)
CO2: 22 mmol/L (ref 22–32)
CREATININE: 1.5 mg/dL — AB (ref 0.61–1.24)
Chloride: 106 mmol/L (ref 101–111)
GFR calc Af Amer: 52 mL/min — ABNORMAL LOW (ref >60)
GFR, EST NON AFRICAN AMERICAN: 44 mL/min — AB (ref >60)
Glucose, Bld: 168 mg/dL — ABNORMAL HIGH (ref 65–99)
Potassium: 4.2 mmol/L (ref 3.5–5.1)
SODIUM: 141 mmol/L (ref 135–145)

## 2016-07-09 LAB — CBC
HCT: 36.7 % — ABNORMAL LOW (ref 39.0–52.0)
HEMOGLOBIN: 11.9 g/dL — AB (ref 13.0–17.0)
MCH: 28.7 pg (ref 26.0–34.0)
MCHC: 32.4 g/dL (ref 30.0–36.0)
MCV: 88.6 fL (ref 78.0–100.0)
Platelets: 133 10*3/uL — ABNORMAL LOW (ref 150–400)
RBC: 4.14 MIL/uL — AB (ref 4.22–5.81)
RDW: 13.8 % (ref 11.5–15.5)
WBC: 6.6 10*3/uL (ref 4.0–10.5)

## 2016-07-09 LAB — PROTIME-INR
INR: 2.91
Prothrombin Time: 31 seconds — ABNORMAL HIGH (ref 11.4–15.2)

## 2016-07-09 MED ORDER — ALBUTEROL SULFATE HFA 108 (90 BASE) MCG/ACT IN AERS: 2.0000 | INHALATION_SPRAY | Freq: Four times a day (QID) | RESPIRATORY_TRACT | 2 refills | Status: DC | PRN

## 2016-07-09 MED ORDER — GUAIFENESIN ER 600 MG PO TB12: 600.0000 mg | ORAL_TABLET | Freq: Two times a day (BID) | ORAL | 0 refills | Status: DC | PRN

## 2016-07-09 MED ORDER — IPRATROPIUM-ALBUTEROL 0.5-2.5 (3) MG/3ML IN SOLN: 3.0000 mL | Freq: Two times a day (BID) | RESPIRATORY_TRACT | Status: DC

## 2016-07-09 MED ORDER — ALBUTEROL SULFATE HFA 108 (90 BASE) MCG/ACT IN AERS: 1.0000 | INHALATION_SPRAY | Freq: Four times a day (QID) | RESPIRATORY_TRACT | Status: DC | PRN

## 2016-07-09 MED ORDER — WARFARIN SODIUM 3 MG PO TABS
3.0000 mg | ORAL_TABLET | Freq: Once | ORAL | Status: DC
  Filled 2016-07-09: qty 1

## 2016-07-09 NOTE — Progress Notes (Signed)
[facility] for Warfarin Indication: atrial fibrillation  Allergies  Allergen Reactions  . Diltiazem Hcl Itching    Patient Measurements: Height: '5\' 11"'$  (180.3 cm) Weight: 241 lb 9.4 oz (109.6 kg) IBW/kg (Calculated) : 75.3 Heparin Dosing Weight:   Vital Signs: Temp: 98.5 F (36.9 C) (04/08 0458) BP: 159/88 (04/08 0839) Pulse Rate: 82 (04/08 0458)  Labs:  Recent Labs  07/07/16 0845 07/08/16 1218 07/08/16 1727 07/09/16 0426  HGB  --  13.4  --  11.9*  HCT  --  41.0  --  36.7*  PLT  --  147*  --  133*  LABPROT  --   --  27.4* 31.0*  INR 2.6  --  2.49 2.91  CREATININE  --  1.42*  --  1.50*    Estimated Creatinine Clearance: 55.2 mL/min (A) (by C-G formula based on SCr of 1.5 mg/dL (H)).   Assessment: 71yo male with history of Afib on warfarin PTA presents with SOB. Pharmacy is consulted to dose warfarin for atrial fibrillation.   Home Warfarin Dose: '3mg'$  Tues and 4.'5mg'$  AODs with last dose 4/7.  INR this morning therapeutic at 2.91. Hgb 11.9, plts 133- no bleeding noted.  Goal of Therapy:  INR 2-3 Monitor platelets by anticoagulation protocol: Yes   Plan:  Warfarin '3mg'$  po x1 tonight  Daily INR Monitor s/sx of bleeding  Lavaeh Bau D. Shima Compere, PharmD, BCPS Clinical Pharmacist Pager: [phone] 07/09/2016 11:51 AM

## 2016-07-09 NOTE — Progress Notes (Signed)
   Subjective: Patient reports significant improvement in his symptoms overnight with breathing treatments. He continues to have a mild cough. Wheezing has resolved. He has remained afebrile and is breathing comfortably on room air. Oxygen saturation 98%.  Objective:  Vital signs in last 24 hours: Vitals:   07/09/16 0458 07/09/16 0629 07/09/16 0839 07/09/16 0845  BP: (!) 156/56  (!) 159/88   Pulse: 82     Resp: 19     Temp: 98.5 F (36.9 C)     TempSrc:      SpO2: 98%   97%  Weight:  241 lb 9.4 oz (109.6 kg)    Height:       Physical Exam: Constitutional: Sitting up on the side of the bed, NAD Neck: Supple, trachea midline.  Cardiovascular: RRR, no murmurs, rubs, or gallops.  Pulmonary/Chest: CTAB, wheezing has resolved.  Abdominal: Soft, non tender, non distended. +BS.  Extremities: Warm and well perfused. Nonpitting lower extremity edema  Neurological: A&Ox3, CN II - XII grossly intact.  Skin: No rashes or erythema  Psychiatric: Normal mood and affect   Assessment/Plan:  Patient is a 71 year old male past medical history significant for hypertension, hyperlipidemia, CKD stage III, CAD (status post CABG), AAA, paroxysmal A. fib (on warfarin), and CVA here with cough, wheezing, and SOB x 1 day, likely due to viral URI.   Viral URI: Wheezing has resolved and patient feels back to his baseline. Denies shortness of breath. Mild residual cough. He has remained afebrile and is satting 98% on room air. Plan for discharge home today with when necessary albuterol inhaler. Patient has follow-up scheduled in one week with PCP. He may benefit from outpatient PFTs given his smoking history once recovered from acute URI. -- Discharge today -- Albuterol PRN  -- Mucinex BID prn  -- F/u [facility] 4/17 at 9:45 am   HTN: -- Continue home lisinopril 10 mg daily  Hx CAD, CVA: -- Continue ASA 81 daily   Paroxysmal Atrial Fibrillation: -- Continue warfarin   Hx Vit B-12 Deficiency: --  Continue home B-12 supplements   FEN: No fluids, replete lytes prn, Heart healthy diet  VTE ppx: Warfarin  Code Status: DNR   Dispo: Anticipated discharge today.   Velna Ochs, MD 07/09/2016, 9:44 AM Pager: [phone]

## 2016-07-09 NOTE — Progress Notes (Signed)
Nsg Discharge Note  Admit Date:  07/08/2016 Discharge date: 07/09/2016   Garald Balding to be D/C'd Home per MD order.  AVS completed.  Copy for chart, and copy for patient signed, and dated. Patient/caregiver able to verbalize understanding.  Discharge Medication: Allergies as of 07/09/2016      Reactions   Diltiazem Hcl Itching      Medication List    TAKE these medications   albuterol 108 (90 Base) MCG/ACT inhaler Commonly known as:  PROVENTIL HFA;VENTOLIN HFA Inhale 2 puffs into the lungs every 6 (six) hours as needed for wheezing or shortness of breath.   aspirin EC 81 MG tablet Take 81 mg by mouth daily after supper.   Fish Oil 1000 MG Caps Take 1,000 mg by mouth daily after supper.   gabapentin 100 MG capsule Commonly known as:  NEURONTIN Take 1 capsule (100 mg total) by mouth daily.   guaiFENesin 600 MG 12 hr tablet Commonly known as:  MUCINEX Take 1 tablet (600 mg total) by mouth 2 (two) times daily as needed.   lisinopril 10 MG tablet Commonly known as:  PRINIVIL,ZESTRIL Take 1 tablet (10 mg total) by mouth daily.   multivitamin tablet Take 1 tablet by mouth daily.   ondansetron 4 MG tablet Commonly known as:  ZOFRAN Take 1 tablet (4 mg total) by mouth every 8 (eight) hours as needed for nausea or vomiting.   simvastatin 40 MG tablet Commonly known as:  ZOCOR TAKE ONE TABLET BY MOUTH ONCE DAILY AFTER  SUPPER What changed:  how much to take  how to take this  when to take this  additional instructions   vitamin B-12 1000 MCG tablet Commonly known as:  CYANOCOBALAMIN Take 1 tablet (1,000 mcg total) by mouth daily. What changed:  when to take this   warfarin 3 MG tablet Commonly known as:  COUMADIN Take as directed by Coumadin Clinic What changed:  how much to take  how to take this  when to take this  additional instructions       Discharge Assessment: Vitals:   07/09/16 0458 07/09/16 0839  BP: (!) 156/56 (!) 159/88  Pulse: 82    Resp: 19   Temp: 98.5 F (36.9 C)    Skin clean, dry and intact without evidence of skin break down, no evidence of skin tears noted. IV catheter discontinued intact. Site without signs and symptoms of complications - no redness or edema noted at insertion site, patient denies c/o pain - only slight tenderness at site.  Dressing with slight pressure applied.  D/c Instructions-Education: Discharge instructions given to patient/family with verbalized understanding. D/c education completed with patient/family including follow up instructions, medication list, d/c activities limitations if indicated, with other d/c instructions as indicated by MD - patient able to verbalize understanding, all questions fully answered. Patient instructed to return to ED, call 911, or call MD for any changes in condition.  Patient escorted via [place], and D/C home via private auto.  Krishika Bugge Margaretha Sheffield, RN 07/09/2016 1:46 PM

## 2016-07-09 NOTE — Discharge Summary (Signed)
Name: Dylan Morgan MRN: [id number] DOB: 29-Nov-1945 71 y.o. PCP: Aldine Contes, MD  Date of Admission: 07/08/2016 12:17 PM Date of Discharge: 07/09/2016 Attending Physician: Annia Belt, MD  Discharge Diagnosis: 1. Viral URI   Discharge Medications: Allergies as of 07/09/2016      Reactions   Diltiazem Hcl Itching      Medication List    TAKE these medications   aspirin EC 81 MG tablet Take 81 mg by mouth daily after supper.   Fish Oil 1000 MG Caps Take 1,000 mg by mouth daily after supper.   gabapentin 100 MG capsule Commonly known as:  NEURONTIN Take 1 capsule (100 mg total) by mouth daily.   guaiFENesin 600 MG 12 hr tablet Commonly known as:  MUCINEX Take 1 tablet (600 mg total) by mouth 2 (two) times daily as needed.   lisinopril 10 MG tablet Commonly known as:  PRINIVIL,ZESTRIL Take 1 tablet (10 mg total) by mouth daily.   multivitamin tablet Take 1 tablet by mouth daily.   ondansetron 4 MG tablet Commonly known as:  ZOFRAN Take 1 tablet (4 mg total) by mouth every 8 (eight) hours as needed for nausea or vomiting.   simvastatin 40 MG tablet Commonly known as:  ZOCOR TAKE ONE TABLET BY MOUTH ONCE DAILY AFTER  SUPPER What changed:  how much to take  how to take this  when to take this  additional instructions   vitamin B-12 1000 MCG tablet Commonly known as:  CYANOCOBALAMIN Take 1 tablet (1,000 mcg total) by mouth daily. What changed:  when to take this   warfarin 3 MG tablet Commonly known as:  COUMADIN Take as directed by Coumadin Clinic What changed:  how much to take  how to take this  when to take this  additional instructions       Disposition and follow-up:   [name] was discharged from [facility] in Stable condition.  At the hospital follow up visit please address:  1.  Viral URI: Symptoms improved with mucinex, duonebs, and prn albuterol nebs. Discharged with prn albuterol inhaler. No  formal COPD diagnosis but he is a former smoker (30 pack year history, quit in 2011). He may benefit from PFTs.   2.  Labs / imaging needed at time of follow-up: Consider PFTs  3.  Pending labs/ test needing follow-up: None   Follow-up Appointments:   Hospital Course by problem list:  1. Viral URI: Patient is a 71 year old male past medical history significant for hypertension, hyperlipidemia, CKD stage III, CAD (status post CABG), AAA, paroxysmal A. fib (on warfarin), and CVA who presented with cough and shortness of breath 1 day. He reported that he was in his usual state of health until the evening prior when he developed symptoms of URI (cough, congestion, and rhinorrhea). He denied fevers at home. Recent sick contacts included his son who had a cold. He endorsed a coughing attack that woke him up in the middle of the night. Patient reports he was coughing so hard during this episode that he had difficulty catching his breath. He went back to sleep after the episode but then presented to the ED in the morning to be evaluated. He continued to endorse cough productive of white sputum. The patient does not have a formal COPD diagnosis (no PFTs on file, not documented on [facility]), but he did endorse wheezing and reported having (smoker's lung). He is a former smoker and reported a 30-pack-year history (  quit 2011). On arrival to the ED, patient was afebrile with temperature 98 and hemodynamically stable blood pressure 114/61, heart rate 72, respiratory rate 14, and oxygen 98% on room air. On admission evaluation, patient was not wearing his nasal cannula due to irritating his nose. He was breathing comfortably on room air with oxygen saturations in the high 90s. CMP was notable only for an elevated creatinine of 1.4 at his baseline. CBC was unremarkable (WBC 6.9). I-STAT troponin 0.00. BNP was mildly elevated, at 340. Chest x-ray was unrevealing. No evidence of pulmonary edema, infiltrate, consolidation.  Patient did endorse some mild chest tightness on initial ED arrival, that resolved with nebulized albuterol. He was treated with Mucinex, scheduled DuoNeb's , and prn albuterol. He was discharged with a PRN albuterol inhaler with plans to follow up in [facility]. He may benefit from outpatient PFTs.   Discharge Vitals:   BP (!) 159/88   Pulse 82   Temp 98.5 F (36.9 C)   Resp 19   Ht '5\' 11"'$  (1.803 m)   Wt 241 lb 9.4 oz (109.6 kg)   SpO2 97%   BMI 33.69 kg/m   Pertinent Labs, Studies, and Procedures:   07/08/2016 CXR 2 View: IMPRESSION: Chronic lung change without definite evidence of superimposed acute cardiopulmonary disease.  Surgical changes of median sternotomy and CABG.  Discharge Instructions: Discharge Instructions    Call MD for:  difficulty breathing, headache or visual disturbances    Complete by:  As directed    Call MD for:  temperature >100.4    Complete by:  As directed    Diet - low sodium heart healthy    Complete by:  As directed    Discharge instructions    Complete by:  As directed    Mr. Ramsburg,   It was a pleasure taking care of you, I am glad you are feeling better. You should be provided with an albuterol inhaler before you leave. You may use this 1-2 puffs every 6 hours as needed for wheezing and shortness of breath. I have also sent a prescription for mucinex to your pharmacy. You may take this twice a day as needed for congestion. Please continue to take your other medications as previously prescribed. You are scheduled to follow up in the Internal Medicine Clinic on 07/18/2016. Please keep this appointment for hospital follow up. If you have any questions or concerns, call our clinic at [phone] or after hours call [phone] and ask for the internal medicine resident on call. Thank you!   - Dr. Philipp Ovens   Increase activity slowly    Complete by:  As directed       Signed: Velna Ochs, MD 07/09/2016, 9:56 AM   Pager: [phone]

## 2016-07-13 ENCOUNTER — Encounter: Payer: Self-pay | Admitting: Internal Medicine

## 2016-07-13 ENCOUNTER — Ambulatory Visit (INDEPENDENT_AMBULATORY_CARE_PROVIDER_SITE_OTHER): Payer: Medicare HMO | Admitting: Internal Medicine

## 2016-07-13 VITALS — BP 137/84 | HR 74 | Temp 97.9°F | Resp 22 | Ht 71.0 in | Wt 243.4 lb

## 2016-07-13 DIAGNOSIS — Z87891 Personal history of nicotine dependence: Secondary | ICD-10-CM | POA: Diagnosis not present

## 2016-07-13 DIAGNOSIS — Z79899 Other long term (current) drug therapy: Secondary | ICD-10-CM

## 2016-07-13 DIAGNOSIS — B9789 Other viral agents as the cause of diseases classified elsewhere: Principal | ICD-10-CM

## 2016-07-13 DIAGNOSIS — J3489 Other specified disorders of nose and nasal sinuses: Secondary | ICD-10-CM

## 2016-07-13 DIAGNOSIS — R062 Wheezing: Secondary | ICD-10-CM | POA: Diagnosis not present

## 2016-07-13 DIAGNOSIS — R0989 Other specified symptoms and signs involving the circulatory and respiratory systems: Secondary | ICD-10-CM | POA: Diagnosis not present

## 2016-07-13 DIAGNOSIS — J069 Acute upper respiratory infection, unspecified: Secondary | ICD-10-CM

## 2016-07-13 DIAGNOSIS — R0602 Shortness of breath: Secondary | ICD-10-CM | POA: Diagnosis not present

## 2016-07-13 DIAGNOSIS — G629 Polyneuropathy, unspecified: Secondary | ICD-10-CM | POA: Diagnosis not present

## 2016-07-13 DIAGNOSIS — R05 Cough: Secondary | ICD-10-CM

## 2016-07-13 MED ORDER — GABAPENTIN 100 MG PO CAPS: 100.0000 mg | ORAL_CAPSULE | Freq: Every day | ORAL | 1 refills | Status: DC

## 2016-07-13 MED ORDER — FLUTICASONE PROPIONATE 50 MCG/ACT NA SUSP: 2.0000 | Freq: Every day | NASAL | 2 refills | Status: DC

## 2016-07-13 MED ORDER — LORATADINE 10 MG PO TABS: 10.0000 mg | ORAL_TABLET | Freq: Every day | ORAL | 2 refills | Status: DC

## 2016-07-13 NOTE — Progress Notes (Signed)
CC: viral URI and allergy symptoms  HPI:  [name] is a 71 y.o. man with a past medical history listed below here today for an acute care visit. He has recently been discharged from the hospital on 07/09/2016.  He was treated symptomatically for a viral URI and discharged to home.  He reports feeling better while in the hospital but since going home is not feeling better, although he is not feeling worse.  He reports continued nocturnal symptoms of non-productive cough which leads to SOB until he can expectorate a "ball of phlegm".  He thinks much of his symptoms are driven by change in seasons and increased pollen.  He has been using Mucinex every 12 hours and Albuterol inhaler about 3-4 times per day, mostly at night.  He also reports sinus congestion and runny nose.  Denies any fever, chills, nausea or vomiting.  He is not using any cough syrups, nasal sprays or allergy medications.  He is accompanied by his wife.  For details of today's visit and the status of his chronic medical issues please refer to the assessment and plan.   Past Medical History:  Diagnosis Date  . AAA (abdominal aortic aneurysm) ([place])    a. 2010 s/p stenting  . Arthritis   . Bradycardia   . CAD (coronary artery disease) CABG in 2004   a. 2004 s/p CABG  . Carotid artery disease ([place])    a. 2012 dopplers with old LICA occlusion , RICA no significant abnormality  . CVA (cerebral infarction)    a. 2013 R Carona radiata stroke   . GERD (gastroesophageal reflux disease)   . Hyperlipidemia   . Hypertension   . Paroxysmal atrial fibrillation ([place]) 02/22/2009   a. on Coumadin   . Prediabetes   . Renal insufficiency   . Seborrheic dermatitis of scalp   . Tobacco abuse     Review of Systems:  Please see pertinent ROS reviewed in HPI and problem based charting.   Physical Exam:  Vitals:   07/13/16 1331  BP: 137/84  Pulse: 74  Resp: (!) 22  Temp: 97.9 F (36.6 C)  TempSrc: Oral  SpO2: 98%  Weight:  243 lb 6.4 oz (110.4 kg)  Height: '5\' 11"'$  (1.803 m)   Physical Exam  Constitutional: He is oriented to person, place, and time.  Elderly appearing man, no distress.  HENT:  Head: Normocephalic and atraumatic.  Eyes: Conjunctivae and EOM are normal.  Pulmonary/Chest: Effort normal. No respiratory distress. He has wheezes.  Mild bilateral expiratory wheezes.  On room air.  Neurological: He is alert and oriented to person, place, and time.  Skin: Skin is warm and dry.  Psychiatric: Mood and affect normal.     Assessment & Plan:   See Encounters Tab for problem based charting.  Patient seen with Dr. Dareen Piano .  Viral URI with cough Assessment: He has recently been discharged from the hospital on 07/09/2016.  He was treated symptomatically for a viral URI and discharged to home.  He reports feeling better while in the hospital but since going home is not feeling better, although he is not feeling worse.  He reports continued nocturnal symptoms of non-productive cough which leads to SOB until he can expectorate a "ball of phlegm".  He thinks much of his symptoms are driven by change in seasons and increased pollen.  He has been using Mucinex every 12 hours and Albuterol inhaler about 3-4 times per day, mostly at night.  He  also reports sinus congestion and runny nose.  Denies any fever, chills, nausea or vomiting.  He is not using any cough syrups, nasal sprays or allergy medications.  On exam, he is breathing comfortably on room air, afebrile, with mild bilateral expiratory wheezes.  He does not appear distressed.  Plan: - symptoms appear most consistent with resolving viral URI vs seasonal allergies - will continue albuterol inhaler as needed - begin flonase intranasal spray daily and loratidine daily - advised can also use saline nasal flushes liberally to help alleviate symptoms - continue Mucinex BID as he has been doing or recommended using OTC robitussin DM or Delsym cough syrup - RTC  in 1 week if symptoms are not any better  Peripheral neuropathy Assessment: Peripheral neuropathy well-controlled on gabapentin and requesting refill.  Plan: - refill gabapentin.

## 2016-07-13 NOTE — Patient Instructions (Signed)
Thank you for coming to see me today. It was a pleasure. Today we talked about:   Allergies & Upper respiratory infection: - I have prescribed 2 new medications: 1. Claritin for allergies and 2.  Flonase for congestion.  Please use as directed. - also use saline nasal spray that you can purchase over the counter - continue using Robitussin-DM cough syrup (over the counter) and albuterol inhaler as needed.  I have also refilled your Gabapentin.  Please follow-up with [place] in 1 week if needed.  If you have any questions or concerns, please do not hesitate to call the office at [phone].  Take Care,   Jule Ser, DO

## 2016-07-13 NOTE — Assessment & Plan Note (Signed)
Assessment: He has recently been discharged from the hospital on 07/09/2016.  He was treated symptomatically for a viral URI and discharged to home.  He reports feeling better while in the hospital but since going home is not feeling better, although he is not feeling worse.  He reports continued nocturnal symptoms of non-productive cough which leads to SOB until he can expectorate a "ball of phlegm".  He thinks much of his symptoms are driven by change in seasons and increased pollen.  He has been using Mucinex every 12 hours and Albuterol inhaler about 3-4 times per day, mostly at night.  He also reports sinus congestion and runny nose.  Denies any fever, chills, nausea or vomiting.  He is not using any cough syrups, nasal sprays or allergy medications.  On exam, he is breathing comfortably on room air, afebrile, with mild bilateral expiratory wheezes.  He does not appear distressed.  Plan: - symptoms appear most consistent with resolving viral URI vs seasonal allergies - will continue albuterol inhaler as needed - begin flonase intranasal spray daily and loratidine daily - advised can also use saline nasal flushes liberally to help alleviate symptoms - continue Mucinex BID as he has been doing or recommended using OTC robitussin DM or Delsym cough syrup - RTC in 1 week if symptoms are not any better

## 2016-07-13 NOTE — Assessment & Plan Note (Signed)
Assessment: Peripheral neuropathy well-controlled on gabapentin and requesting refill.  Plan: - refill gabapentin.

## 2016-07-18 ENCOUNTER — Ambulatory Visit (INDEPENDENT_AMBULATORY_CARE_PROVIDER_SITE_OTHER): Payer: Medicare HMO | Admitting: Internal Medicine

## 2016-07-18 ENCOUNTER — Encounter: Payer: Self-pay | Admitting: Internal Medicine

## 2016-07-18 DIAGNOSIS — I10 Essential (primary) hypertension: Secondary | ICD-10-CM

## 2016-07-18 DIAGNOSIS — J069 Acute upper respiratory infection, unspecified: Secondary | ICD-10-CM

## 2016-07-18 DIAGNOSIS — J309 Allergic rhinitis, unspecified: Secondary | ICD-10-CM | POA: Diagnosis not present

## 2016-07-18 DIAGNOSIS — Z79899 Other long term (current) drug therapy: Secondary | ICD-10-CM

## 2016-07-18 DIAGNOSIS — I9589 Other hypotension: Secondary | ICD-10-CM

## 2016-07-18 DIAGNOSIS — B9789 Other viral agents as the cause of diseases classified elsewhere: Secondary | ICD-10-CM

## 2016-07-18 DIAGNOSIS — Z87891 Personal history of nicotine dependence: Secondary | ICD-10-CM | POA: Diagnosis not present

## 2016-07-18 NOTE — Patient Instructions (Addendum)
Dylan Morgan,  Your sputum production is most likely from your recent viral illness and seasonal allergies. Continue with the flonase, loratadine and sinus irrigation. You do not have a bacterial infection and antibiotics will not help.   Your blood pressure is low today. Please stop taking the Lisinopril.   I would like to see you back in 2 weeks for follow up.

## 2016-07-18 NOTE — Assessment & Plan Note (Signed)
Dylan Morgan was recently admitted from 4/7-4/8 for viral URI. Treated symptomatically and discharged home. Followed up in clinic on 4/12 described allergic rhinitis symptoms and treated with flonase and loratadine daily. Also instructed to try nasal irrigation. He was continued on albuterol prn and mucinex BID.   Still coughing with white and [place] sputum production. No fevers or chills. Reports sinus congestion and rhinorrhea. Some sinus pressure. Itching eyes, sore throat, ear pain. No chest pain. Some shortness of breath. No nausea, vomiting, diarrhea. Says he has tried the Flonase, loratadine and nasal irrigation with some benefit. Requesting antibiotics today; reports his son has similar complaints and his doctor gave him antibiotics and his symptoms were resolved a week later.   Exam is benign today. No sinus pressure, bilateral TM normal, no nasal erythema, oropharynx clear, no cervical lymphadenopathy.    Plan: Agree with previous assessments of allergic rhinitis. Do not see any indication for antibiotics today. Will continue current therapies.   Patient was not pleased with not getting ABX today and repeated numerous times he thinks he should be getting ABX today.

## 2016-07-18 NOTE — Progress Notes (Signed)
   CC: HTN/Viral URI follow up  HPI:  [name] is a 71 y.o. male with a past medical history listed below here today for follow up of his HTN and viral URI symptoms.   For details of today's visit and the status of his chronic medical issues please refer to the assessment and plan.   Past Medical History:  Diagnosis Date  . AAA (abdominal aortic aneurysm) ([place])    a. 2010 s/p stenting  . Arthritis   . Bradycardia   . CAD (coronary artery disease) CABG in 2004   a. 2004 s/p CABG  . Carotid artery disease ([place])    a. 2012 dopplers with old LICA occlusion , RICA no significant abnormality  . CVA (cerebral infarction)    a. 2013 R Carona radiata stroke   . GERD (gastroesophageal reflux disease)   . Hyperlipidemia   . Hypertension   . Paroxysmal atrial fibrillation ([place]) 02/22/2009   a. on Coumadin   . Prediabetes   . Renal insufficiency   . Seborrheic dermatitis of scalp   . Tobacco abuse     Review of Systems:   See HPI  Physical Exam:  Vitals:   07/18/16 0922  BP: 105/73  Pulse: (!) 55  Temp: 97.6 F (36.4 C)  TempSrc: Oral  Weight: 238 lb 11.2 oz (108.3 kg)  Height: '5\' 11"'$  (1.803 m)   Physical Exam  Constitutional: He is well-developed, well-nourished, and in no distress. No distress.  HENT:  Head: Normocephalic and atraumatic.  Right Ear: Tympanic membrane, external ear and ear canal normal.  Left Ear: Tympanic membrane, external ear and ear canal normal.  Nose: Nose normal.  Mouth/Throat: Oropharynx is clear and moist. No oropharyngeal exudate.  Eyes: Pupils are equal, round, and reactive to light.  Cardiovascular: Normal rate and regular rhythm.   Pulmonary/Chest: Effort normal and breath sounds normal.  Lymphadenopathy:    He has no cervical adenopathy.  Vitals reviewed.   Assessment & Plan:   See Encounters Tab for problem based charting.  Patient discussed with Dr. Lynnae January

## 2016-07-18 NOTE — Progress Notes (Signed)
Internal Medicine Clinic Attending  I saw and evaluated the patient.  I personally confirmed the key portions of the history and exam documented by Dr. Wallace and I reviewed pertinent patient test results.  The assessment, diagnosis, and plan were formulated together and I agree with the documentation in the resident's note. 

## 2016-07-18 NOTE — Assessment & Plan Note (Signed)
BP Readings from Last 3 Encounters:  07/18/16 105/73  07/13/16 137/84  07/09/16 (!) 159/88    Lab Results  Component Value Date   NA 141 07/09/2016   K 4.2 07/09/2016   CREATININE 1.50 (H) 07/09/2016   BP today 105/73. Started on lisinopril 10 mg daily at 4/3 visit. Had BMET during recent hospitalizaiton with no electrolyte abnormalities. Some lightheadedness with standing first thing in the morning; no problems the rest of the day. No headaches. No dizziness. Orthostatics today are negative but BP continues to be low in the 75Y systolic.   Pulse today 55.  Assessment: Iatrogenic Hyptoension  Plan: Will D/C lisinopril today.  RTC in 2 weeks. Pulse has been in the 80s in the past, 55 today. If pulse is elevated at follow up can consider low dose BB given his cardiac history.

## 2016-07-19 NOTE — Progress Notes (Signed)
Internal Medicine Clinic Attending  Case discussed with Dr. Gayland Curry at the time of the visit.  We reviewed the resident's history and exam and pertinent patient test results.  I agree with the assessment, diagnosis, and plan of care documented in the resident's note.

## 2016-07-21 ENCOUNTER — Ambulatory Visit (INDEPENDENT_AMBULATORY_CARE_PROVIDER_SITE_OTHER): Payer: Medicare HMO | Admitting: *Deleted

## 2016-07-21 DIAGNOSIS — Z5181 Encounter for therapeutic drug level monitoring: Secondary | ICD-10-CM | POA: Diagnosis not present

## 2016-07-21 DIAGNOSIS — I480 Paroxysmal atrial fibrillation: Secondary | ICD-10-CM | POA: Diagnosis not present

## 2016-07-21 LAB — POCT INR: INR: 2.3

## 2016-07-21 MED ORDER — WARFARIN SODIUM 3 MG PO TABS: 3.0000 mg | ORAL_TABLET | ORAL | 2 refills | Status: DC

## 2016-07-25 ENCOUNTER — Encounter: Payer: Self-pay | Admitting: Family

## 2016-08-01 ENCOUNTER — Ambulatory Visit (HOSPITAL_COMMUNITY)
Admission: RE | Admit: 2016-08-01 | Discharge: 2016-08-01 | Disposition: A | Discharge status: Home / Self Care | Payer: Medicare HMO | Source: Ambulatory Visit | Attending: Family | Admitting: Family

## 2016-08-01 ENCOUNTER — Ambulatory Visit (INDEPENDENT_AMBULATORY_CARE_PROVIDER_SITE_OTHER): Payer: Medicare HMO | Admitting: Family

## 2016-08-01 ENCOUNTER — Encounter: Payer: Self-pay | Admitting: Family

## 2016-08-01 ENCOUNTER — Ambulatory Visit (INDEPENDENT_AMBULATORY_CARE_PROVIDER_SITE_OTHER): Payer: Medicare HMO | Admitting: Internal Medicine

## 2016-08-01 VITALS — BP 138/87 | HR 58 | Temp 98.3°F | Resp 20 | Ht 71.0 in | Wt 243.0 lb

## 2016-08-01 VITALS — BP 170/72 | HR 50 | Temp 97.6°F | Wt 245.2 lb

## 2016-08-01 DIAGNOSIS — I6523 Occlusion and stenosis of bilateral carotid arteries: Secondary | ICD-10-CM | POA: Insufficient documentation

## 2016-08-01 DIAGNOSIS — I714 Abdominal aortic aneurysm, without rupture, unspecified: Secondary | ICD-10-CM

## 2016-08-01 DIAGNOSIS — Z79899 Other long term (current) drug therapy: Secondary | ICD-10-CM

## 2016-08-01 DIAGNOSIS — I10 Essential (primary) hypertension: Secondary | ICD-10-CM

## 2016-08-01 DIAGNOSIS — I6522 Occlusion and stenosis of left carotid artery: Secondary | ICD-10-CM

## 2016-08-01 DIAGNOSIS — I4891 Unspecified atrial fibrillation: Secondary | ICD-10-CM

## 2016-08-01 DIAGNOSIS — Z95828 Presence of other vascular implants and grafts: Secondary | ICD-10-CM

## 2016-08-01 DIAGNOSIS — I6521 Occlusion and stenosis of right carotid artery: Secondary | ICD-10-CM

## 2016-08-01 DIAGNOSIS — Z87891 Personal history of nicotine dependence: Secondary | ICD-10-CM

## 2016-08-01 DIAGNOSIS — B9789 Other viral agents as the cause of diseases classified elsewhere: Secondary | ICD-10-CM

## 2016-08-01 DIAGNOSIS — J069 Acute upper respiratory infection, unspecified: Secondary | ICD-10-CM

## 2016-08-01 LAB — VAS US CAROTID
LCCADSYS: -45 cm/s
LCCAPDIAS: 4 cm/s
LEFT ECA DIAS: -8 cm/s
Left CCA dist dias: -7 cm/s
Left CCA prox sys: 75 cm/s
RCCAPDIAS: 17 cm/s
RIGHT CCA MID DIAS: 19 cm/s
RIGHT ECA DIAS: -18 cm/s
Right CCA prox sys: 75 cm/s
Right cca dist sys: -64 cm/s

## 2016-08-01 MED ORDER — LISINOPRIL 5 MG PO TABS: 5.0000 mg | ORAL_TABLET | Freq: Every day | ORAL | 1 refills | Status: DC

## 2016-08-01 NOTE — Progress Notes (Signed)
Internal Medicine Clinic Attending  Case discussed with Dr. Ahmed at the time of the visit.  We reviewed the resident's history and exam and pertinent patient test results.  I agree with the assessment, diagnosis, and plan of care documented in the resident's note. 

## 2016-08-01 NOTE — Progress Notes (Signed)
[facility] [place]   CC: Follow up s/p Endovascular Repair of Abdominal Aortic Aneurysm, and extracranial carotid artery stenosis      History of Present Illness  Dylan Morgan is a 71 y.o. (11-26-1945) male patient of Dr. Kellie Simmering who returns for continued followup regarding his aortic stent graft which was placed in 2010 for abdominal aortic aneurysm and his known left ICA occlusion. Previously he has had mild right ICA stenosis and he has been asymptomatic. 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 He has no active symptoms of lateralizing weakness, any aphasia, visual loss in the right eye. He has occasional numbness in both feet. But denies claudication symptoms.  Pt states he had a TIA or stroke about 2014 as manifested by numbness in possibly left arm and leg, some slurred speech, states he was taken to [facility] ED and given an IV blood thinner. He has not had any subsequent stroke or TIA. He is right hand dominant.   Pt states his feet feel numb since about January 2016, is not getting worse, he denies any known history of DM. He does report known lumbar curvature problem, relieved by chiropractic care; states he also has known c-spine issues.  He has chronic low back pain, occassionally has left radiculopathy type pain. He denies any new back pain, denies abdominal pain.   Pt Diabetic: No Pt smoker: former smoker, quit in 2011  Pt meds include: Statin :Yes Betablocker: No ASA: Yes Other anticoagulants/antiplatelets: coumadin, states he has atrial fib    Past Medical History:  Diagnosis Date  . AAA (abdominal aortic aneurysm) ([place])    a. 2010 s/p stenting  . Arthritis   . Bradycardia   . CAD (coronary artery disease) CABG in 2004   a. 2004 s/p CABG  . Carotid artery disease ([place])    a. 2012 dopplers with old LICA occlusion , RICA no significant abnormality  . CVA (cerebral infarction)    a. 2013 R Carona  radiata stroke   . GERD (gastroesophageal reflux disease)   . Hyperlipidemia   . Hypertension   . Paroxysmal atrial fibrillation ([place]) 02/22/2009   a. on Coumadin   . Prediabetes   . Renal insufficiency   . Seborrheic dermatitis of scalp   . Tobacco abuse    Past Surgical History:  Procedure Laterality Date  . ABDOMINAL AORTIC ANEURYSM REPAIR  2010   Aortic stent repair  . CORONARY ARTERY BYPASS GRAFT  2004  . [place]   Social History Social History  Substance Use Topics  . Smoking status: Former Smoker    Packs/day: 0.00    Years: 35.00    Types: Cigarettes    Quit date: 02/03/2010  . Smokeless tobacco: Never Used  . Alcohol use No   Family History Family History  Problem Relation Age of Onset  . Diabetes Mother   . Stroke Father   . Thyroid cancer Sister   . Heart disease Brother     Coronary artery disease  . Heart attack Brother   . Heart attack Brother    Current Outpatient Prescriptions on File Prior to Visit  Medication Sig Dispense Refill  . albuterol (PROVENTIL HFA;VENTOLIN HFA) 108 (90 Base) MCG/ACT inhaler Inhale 2 puffs into the lungs every 6 (six) hours as needed for wheezing or shortness of breath. 1 Inhaler 2  . aspirin EC 81 MG tablet Take 81 mg  by mouth daily after supper.     . fluticasone (FLONASE) 50 MCG/ACT nasal spray Place 2 sprays into both nostrils daily. 16 g 2  . gabapentin (NEURONTIN) 100 MG capsule Take 1 capsule (100 mg total) by mouth daily. 90 capsule 1  . guaiFENesin (MUCINEX) 600 MG 12 hr tablet Take 1 tablet (600 mg total) by mouth 2 (two) times daily as needed. 10 tablet 0  . loratadine (CLARITIN) 10 MG tablet Take 1 tablet (10 mg total) by mouth daily. 30 tablet 2  . Multiple Vitamin (MULTIVITAMIN) tablet Take 1 tablet by mouth daily. 90 tablet 3  . Omega-3 Fatty Acids (FISH OIL) 1000 MG CAPS Take 1,000 mg by mouth daily after supper.    . ondansetron (ZOFRAN) 4 MG tablet Take 1 tablet (4 mg total) by mouth every  8 (eight) hours as needed for nausea or vomiting. 10 tablet 0  . simvastatin (ZOCOR) 40 MG tablet TAKE ONE TABLET BY MOUTH ONCE DAILY AFTER  SUPPER (Patient taking differently: Take 40 mg by mouth daily after supper. ) 90 tablet 2  . vitamin B-12 (CYANOCOBALAMIN) 1000 MCG tablet Take 1 tablet (1,000 mcg total) by mouth daily. (Patient taking differently: Take 1,000 mcg by mouth daily after supper. ) 90 tablet 0  . warfarin (COUMADIN) 3 MG tablet Take 1-2 tablets (3-6 mg total) by mouth See admin instructions. 4.5 mg every day except on Tuesday patient takes 3 mg. 40 tablet 2   No current facility-administered medications on file prior to visit.    Allergies  Allergen Reactions  . Diltiazem Hcl Itching     ROS: See HPI for pertinent positives and negatives.  Physical Examination  Vitals:   08/01/16 1317 08/01/16 1320  BP: 140/88 138/87  Pulse: 60 (!) 58  Resp: 20   Temp: 98.3 F (36.8 C)   TempSrc: Oral   SpO2: 95%   Weight: 243 lb (110.2 kg)   Height: '5\' 11"'$  (1.803 m)    Body mass index is 33.89 kg/m.  General: A&O x 3, WD, obese male  Pulmonary: Sym exp, respirations are non labored, good air movt, CTAB, no rales, rhonchi, or wheezing.  Cardiac: Irregular rhythm, controlled rate, no murmur appreciated  Vascular: Vessel Right Left  Radial 2+Palpable 2+Palpable  Carotid  without bruit  without bruit  Aorta Not palpable N/A  Popliteal Not palpable Not palpable  PT Not Palpable Not Palpable  DP Faintly Palpable Faintly Palpable   Gastrointestinal: soft, NTND, -G/R, - HSM, - palpable masses, - CVAT B.  Musculoskeletal: M/S 5/5 throughout, extremities without ischemic changes.  Neurologic: Pain and light touch intact in extremities, Motor exam as listed above    DATA  Carotid Duplex (08/01/16): Right ICA: 40-59% stenosis Left ICA: confirmed occlusion.  Right vertebral artery flow is antegrade/abnormal, left is antegrade. Right subclavian artery waveforms are  monophasic, left are triphasic. No significant change sine the last exam on 12-29-14.   CTA Abd/Pelvis Duplex (Date: 07-08-15) IMPRESSION: Patient is status post endovascular repair of infrarenal abdominal aortic aneurysm with what appears to be a Public affairs consultant. No evidence of type 1 or type 2 endoleak, with unchanged size of the excluded aneurysm sac. On the coronal reformatted images, there does appear to be a small amount of distal migration of the stent graft, with the greatest distance from the left renal artery to the proximal margin of the graft approximately 16 mm, as opposed to an 8 mm distance on the first postoperative scan of 2010.  Continued surveillance is recommended.   Medical Decision Making  Dylan Morgan is a 71 y.o. male who is s/p endovascular repair of abdominal aortic aneurysm in 2010 and has a known left ICA occlusion.  Pt reports a stroke or TIA in 2014 with no residual neurological deficits, no subsequent stroke or TIA.  I reviewed with Dr. Donnetta Hutching the CTA abd/pelvis results from 07-08-15; will repeat CTA within the next 2-4 weeks, and pt will follow up with Dr. Donzetta Matters afterward.  Carotid duplex in a year.    I discussed with the patient the importance of surveillance of the endograft.  I emphasized the importance of maximal medical management including strict control of blood pressure, blood glucose, and lipid levels, antiplatelet agents, obtaining regular exercise, and cessation of smoking.   Thank you for allowing [place] to participate in this patient's care.  Clemon Chambers, RN, MSN, FNP-C [facility] [place] Office: [facility] Physician: Early  08/01/2016, 2:01 PM

## 2016-08-01 NOTE — Patient Instructions (Signed)
Glad to see you today.  Start taking a lower dose of lisinopril - '5mg'$  daily starting today.  Follow up in 2 weeks for BP check.

## 2016-08-01 NOTE — Patient Instructions (Signed)
Stroke Prevention Some medical conditions and behaviors are associated with an increased chance of having a stroke. You may prevent a stroke by making healthy choices and managing medical conditions. How can I reduce my risk of having a stroke?  Stay physically active. Get at least 30 minutes of activity on most or all days.  Do not smoke. It may also be helpful to avoid exposure to secondhand smoke.  Limit alcohol use. Moderate alcohol use is considered to be:  No more than 2 drinks per day for men.  No more than 1 drink per day for nonpregnant women.  Eat healthy foods. This involves:  Eating 5 or more servings of fruits and vegetables a day.  Making dietary changes that address high blood pressure (hypertension), high cholesterol, diabetes, or obesity.  Manage your cholesterol levels.  Making food choices that are high in fiber and low in saturated fat, trans fat, and cholesterol may control cholesterol levels.  Take any prescribed medicines to control cholesterol as directed by your health care provider.  Manage your diabetes.  Controlling your carbohydrate and sugar intake is recommended to manage diabetes.  Take any prescribed medicines to control diabetes as directed by your health care provider.  Control your hypertension.  Making food choices that are low in salt (sodium), saturated fat, trans fat, and cholesterol is recommended to manage hypertension.  Ask your health care provider if you need treatment to lower your blood pressure. Take any prescribed medicines to control hypertension as directed by your health care provider.  If you are 18-39 years of age, have your blood pressure checked every 3-5 years. If you are 40 years of age or older, have your blood pressure checked every year.  Maintain a healthy weight.  Reducing calorie intake and making food choices that are low in sodium, saturated fat, trans fat, and cholesterol are recommended to manage  weight.  Stop drug abuse.  Avoid taking birth control pills.  Talk to your health care provider about the risks of taking birth control pills if you are over 35 years old, smoke, get migraines, or have ever had a blood clot.  Get evaluated for sleep disorders (sleep apnea).  Talk to your health care provider about getting a sleep evaluation if you snore a lot or have excessive sleepiness.  Take medicines only as directed by your health care provider.  For some people, aspirin or blood thinners (anticoagulants) are helpful in reducing the risk of forming abnormal blood clots that can lead to stroke. If you have the irregular heart rhythm of atrial fibrillation, you should be on a blood thinner unless there is a good reason you cannot take them.  Understand all your medicine instructions.  Make sure that other conditions (such as anemia or atherosclerosis) are addressed. Get help right away if:  You have sudden weakness or numbness of the face, arm, or leg, especially on one side of the body.  Your face or eyelid droops to one side.  You have sudden confusion.  You have trouble speaking (aphasia) or understanding.  You have sudden trouble seeing in one or both eyes.  You have sudden trouble walking.  You have dizziness.  You have a loss of balance or coordination.  You have a sudden, severe headache with no known cause.  You have new chest pain or an irregular heartbeat. Any of these symptoms may represent a serious problem that is an emergency. Do not wait to see if the symptoms will go away.   Get medical help at once. Call your local emergency services (911 in [place] Do not drive yourself to the hospital. This information is not intended to replace advice given to you by your health care provider. Make sure you discuss any questions you have with your health care provider. Document Released: 04/27/2004 Document Revised: 08/26/2015 Document Reviewed: 09/20/2012 Elsevier  Interactive Patient Education  2017 [employer]  

## 2016-08-01 NOTE — Progress Notes (Signed)
   CC: BP f/up  HPI:  Mr.Johnathen L River is a 71 y.o. with PMH as listed below is here for HTN f/up  Past Medical History:  Diagnosis Date  . AAA (abdominal aortic aneurysm) ([place])    a. 2010 s/p stenting  . Arthritis   . Bradycardia   . CAD (coronary artery disease) CABG in 2004   a. 2004 s/p CABG  . Carotid artery disease ([employer])    a. 2012 dopplers with old LICA occlusion , RICA no significant abnormality  . CVA (cerebral infarction)    a. 2013 R Carona radiata stroke   . GERD (gastroesophageal reflux disease)   . Hyperlipidemia   . Hypertension   . Paroxysmal atrial fibrillation ([place]) 02/22/2009   a. on Coumadin   . Prediabetes   . Renal insufficiency   . Seborrheic dermatitis of scalp   . Tobacco abuse    Lisinopril '10mg'$  daily was d/ced last visit as his BP was on lower side 105/73. Here for f/up on BP. Already rate controlled for his AFib without any med. BP 170/72 today. Denies any HA, dizziness, change in vision, or any numbness/tingling.   Review of Systems:   Review of Systems  Constitutional: Negative for chills and fever.  Respiratory: Negative for cough.   Cardiovascular: Negative for chest pain and palpitations.  Genitourinary: Negative for dysuria.  Neurological: Negative for dizziness and headaches.     Physical Exam:  Vitals:   08/01/16 0836  BP: (!) 170/72  Pulse: (!) 50  Temp: 97.6 F (36.4 C)  TempSrc: Oral  SpO2: 100%  Weight: 245 lb 3.2 oz (111.2 kg)   Physical Exam  Constitutional: He is oriented to person, place, and time. He appears well-developed and well-nourished.  HENT:  Head: Normocephalic and atraumatic.  Cardiovascular: Normal rate and regular rhythm.  Exam reveals no gallop and no friction rub.   No murmur heard. Respiratory: Effort normal and breath sounds normal. No respiratory distress. He has no wheezes.  Neurological: He is alert and oriented to person, place, and time.    Assessment & Plan:   See Encounters Tab for  problem based charting.  Patient discussed with Dr. Angelia Mould

## 2016-08-01 NOTE — Assessment & Plan Note (Signed)
Vitals:   08/01/16 0836  BP: (!) 170/72  Pulse: (!) 50  Temp: 97.6 F (36.4 C)   BP high after stopping lisinopril '10mg'$  daily. Will restart lisinopril at lower dose '5mg'$  daily.  f/up in 2 weeks and check BMET at that visit.

## 2016-08-03 NOTE — Addendum Note (Signed)
Addended by: Lianne Cure A on: 08/03/2016 02:38 PM   Modules accepted: Orders

## 2016-08-10 ENCOUNTER — Ambulatory Visit
Admission: RE | Admit: 2016-08-10 | Discharge: 2016-08-10 | Disposition: A | Discharge status: Home / Self Care | Payer: Medicare HMO | Source: Ambulatory Visit | Attending: Family | Admitting: Family

## 2016-08-10 DIAGNOSIS — Z95828 Presence of other vascular implants and grafts: Secondary | ICD-10-CM

## 2016-08-10 DIAGNOSIS — I714 Abdominal aortic aneurysm, without rupture, unspecified: Secondary | ICD-10-CM

## 2016-08-10 MED ORDER — IOPAMIDOL (ISOVUE-370) INJECTION 76%
75.0000 mL | Freq: Once | INTRAVENOUS | Status: AC | PRN
  Administered 2016-08-10: 75 mL via INTRAVENOUS

## 2016-08-11 ENCOUNTER — Ambulatory Visit (INDEPENDENT_AMBULATORY_CARE_PROVIDER_SITE_OTHER): Payer: Medicare HMO

## 2016-08-11 DIAGNOSIS — I480 Paroxysmal atrial fibrillation: Secondary | ICD-10-CM

## 2016-08-11 DIAGNOSIS — Z5181 Encounter for therapeutic drug level monitoring: Secondary | ICD-10-CM | POA: Diagnosis not present

## 2016-08-11 LAB — POCT INR: INR: 1.8

## 2016-08-15 ENCOUNTER — Ambulatory Visit (INDEPENDENT_AMBULATORY_CARE_PROVIDER_SITE_OTHER): Payer: Medicare HMO | Admitting: Internal Medicine

## 2016-08-15 VITALS — BP 140/88 | HR 64 | Temp 97.6°F | Ht 69.0 in | Wt 240.1 lb

## 2016-08-15 DIAGNOSIS — I10 Essential (primary) hypertension: Secondary | ICD-10-CM | POA: Diagnosis not present

## 2016-08-15 DIAGNOSIS — Z87891 Personal history of nicotine dependence: Secondary | ICD-10-CM

## 2016-08-15 NOTE — Progress Notes (Signed)
   CC: HTN f/u   HPI:  [name] is a 71 y.o. with PMh as listed below is here for HTN f/up.   Past Medical History:  Diagnosis Date  . AAA (abdominal aortic aneurysm) ([place])    a. 2010 s/p stenting  . Arthritis   . Bradycardia   . CAD (coronary artery disease) CABG in 2004   a. 2004 s/p CABG  . Carotid artery disease ([place])    a. 2012 dopplers with old LICA occlusion , RICA no significant abnormality  . CVA (cerebral infarction)    a. 2013 R Carona radiata stroke   . GERD (gastroesophageal reflux disease)   . Hyperlipidemia   . Hypertension   . Paroxysmal atrial fibrillation ([place]) 02/22/2009   a. on Coumadin   . Prediabetes   . Renal insufficiency   . Seborrheic dermatitis of scalp   . Tobacco abuse    Last visit we restarted lisinopril '5mg'$  daily. Today BP is 140/88, doing well, no complaints. Due for BMET.   Review of Systems:   Review of Systems  Constitutional: Negative for chills and fever.  Respiratory: Negative for cough.   Cardiovascular: Negative for chest pain and palpitations.  Gastrointestinal: Negative for heartburn, nausea and vomiting.  Neurological: Negative for dizziness and headaches.     Physical Exam:  Vitals:   08/15/16 0835  BP: 140/88  Pulse: 64  Temp: 97.6 F (36.4 C)  TempSrc: Oral  SpO2: 98%  Weight: 240 lb 1.6 oz (108.9 kg)  Height: '5\' 9"'$  (1.753 m)   Physical Exam  Constitutional: He is oriented to person, place, and time. He appears well-developed and well-nourished. No distress.  HENT:  Head: Normocephalic and atraumatic.  Cardiovascular: Exam reveals no gallop and no friction rub.   No murmur heard. Respiratory: Effort normal and breath sounds normal. No respiratory distress. He has no wheezes.  Musculoskeletal: Normal range of motion. He exhibits no edema.  Neurological: He is alert and oriented to person, place, and time. No cranial nerve deficit.  Skin: He is not diaphoretic.    Assessment & Plan:   See  Encounters Tab for problem based charting.  Patient discussed with Dr. Daryll Drown

## 2016-08-15 NOTE — Assessment & Plan Note (Signed)
Vitals:   08/15/16 0835  BP: 140/88  Pulse: 64  Temp: 97.6 F (36.4 C)    Last visit we restarted lisinopril '5mg'$  daily. Today BP is 140/88, doing well, no complaints. Due for BMET.  -cont lisinopril 5 mg. This can be titrated up to the new goal of <130 in the future by his PCP if desired.  -check BMET, f/up in 3 months.

## 2016-08-15 NOTE — Patient Instructions (Addendum)
Continue to take your medication.  Follow up with your regular doctor in 3 months.

## 2016-08-16 LAB — BASIC METABOLIC PANEL
BUN/Creatinine Ratio: 14 (ref 10–24)
BUN: 20 mg/dL (ref 8–27)
CO2: 21 mmol/L (ref 18–29)
Calcium: 9.2 mg/dL (ref 8.6–10.2)
Chloride: 102 mmol/L (ref 96–106)
Creatinine, Ser: 1.43 mg/dL — ABNORMAL HIGH (ref 0.76–1.27)
GFR calc Af Amer: 56 mL/min/{1.73_m2} — ABNORMAL LOW (ref >59)
GFR, EST NON AFRICAN AMERICAN: 48 mL/min/{1.73_m2} — AB (ref >59)
GLUCOSE: 114 mg/dL — AB (ref 65–99)
POTASSIUM: 4.3 mmol/L (ref 3.5–5.2)
SODIUM: 141 mmol/L (ref 134–144)

## 2016-08-16 NOTE — Progress Notes (Signed)
Internal Medicine Clinic Attending  Case discussed with Dr. Ahmed soon after the resident saw the patient.  We reviewed the resident's history and exam and pertinent patient test results.  I agree with the assessment, diagnosis, and plan of care documented in the resident's note. 

## 2016-08-18 ENCOUNTER — Encounter: Payer: Self-pay | Admitting: Vascular Surgery

## 2016-08-25 ENCOUNTER — Ambulatory Visit (INDEPENDENT_AMBULATORY_CARE_PROVIDER_SITE_OTHER): Payer: Medicare HMO | Admitting: Vascular Surgery

## 2016-08-25 ENCOUNTER — Encounter: Payer: Self-pay | Admitting: Vascular Surgery

## 2016-08-25 VITALS — BP 116/83 | HR 68 | Temp 97.2°F | Resp 20 | Ht 69.0 in | Wt 240.3 lb

## 2016-08-25 DIAGNOSIS — I779 Disorder of arteries and arterioles, unspecified: Secondary | ICD-10-CM | POA: Diagnosis not present

## 2016-08-25 DIAGNOSIS — I739 Peripheral vascular disease, unspecified: Secondary | ICD-10-CM

## 2016-08-25 DIAGNOSIS — I714 Abdominal aortic aneurysm, without rupture, unspecified: Secondary | ICD-10-CM

## 2016-08-25 DIAGNOSIS — G459 Transient cerebral ischemic attack, unspecified: Secondary | ICD-10-CM | POA: Diagnosis not present

## 2016-08-25 NOTE — Progress Notes (Signed)
Patient ID: Dylan Morgan, male   DOB: Jan 03, 1946, 71 y.o.   MRN: [id number]  Reason for Consult: Re-evaluation (2-4 wk f/u CTA abd/pelvis prior)   Referred by Aldine Contes, MD  Subjective:     HPI:  Dylan Morgan is a 71 y.o. male previous endovascular repair of his aortic aneurysm performed a 2010. He was recently evaluated for his carotids at our office from which he is stable. He now presents with CT scan to evaluate his aneurysm. He does not have any back or abdominal pain. He remains at his normal level activity although he does have some pain with walking that is mostly associated with numbness of his bilateral feet. He is not having any new issues today.he is a former smoker having quit in 2011 takes an aspirin daily as well as Coumadin for atrial fibrillation.  Past Medical History:  Diagnosis Date  . AAA (abdominal aortic aneurysm) ([place])    a. 2010 s/p stenting  . Arthritis   . Bradycardia   . CAD (coronary artery disease) CABG in 2004   a. 2004 s/p CABG  . Carotid artery disease ([place])    a. 2012 dopplers with old LICA occlusion , RICA no significant abnormality  . CVA (cerebral infarction)    a. 2013 R Carona radiata stroke   . GERD (gastroesophageal reflux disease)   . Hyperlipidemia   . Hypertension   . Paroxysmal atrial fibrillation ([place]) 02/22/2009   a. on Coumadin   . Prediabetes   . Renal insufficiency   . Seborrheic dermatitis of scalp   . Tobacco abuse    Family History  Problem Relation Age of Onset  . Diabetes Mother   . Stroke Father   . Thyroid cancer Sister   . Heart disease Brother        Coronary artery disease  . Heart attack Brother   . Heart attack Brother    Past Surgical History:  Procedure Laterality Date  . ABDOMINAL AORTIC ANEURYSM REPAIR  2010   Aortic stent repair  . CORONARY ARTERY BYPASS GRAFT  2004  . [place]    Short Social History:  Social History  Substance Use Topics  . Smoking status: Former  Smoker    Packs/day: 0.00    Years: 35.00    Types: Cigarettes    Quit date: 02/03/2010  . Smokeless tobacco: Never Used  . Alcohol use No    Allergies  Allergen Reactions  . Diltiazem Hcl Itching    Current Outpatient Prescriptions  Medication Sig Dispense Refill  . albuterol (PROVENTIL HFA;VENTOLIN HFA) 108 (90 Base) MCG/ACT inhaler Inhale 2 puffs into the lungs every 6 (six) hours as needed for wheezing or shortness of breath. 1 Inhaler 2  . aspirin EC 81 MG tablet Take 81 mg by mouth daily after supper.     . fluticasone (FLONASE) 50 MCG/ACT nasal spray Place 2 sprays into both nostrils daily. 16 g 2  . gabapentin (NEURONTIN) 100 MG capsule Take 1 capsule (100 mg total) by mouth daily. 90 capsule 1  . guaiFENesin (MUCINEX) 600 MG 12 hr tablet Take 1 tablet (600 mg total) by mouth 2 (two) times daily as needed. 10 tablet 0  . lisinopril (PRINIVIL,ZESTRIL) 5 MG tablet Take 1 tablet (5 mg total) by mouth daily. 30 tablet 1  . loratadine (CLARITIN) 10 MG tablet Take 1 tablet (10 mg total) by mouth daily. 30 tablet 2  . Multiple Vitamin (MULTIVITAMIN) tablet Take 1  tablet by mouth daily. 90 tablet 3  . Omega-3 Fatty Acids (FISH OIL) 1000 MG CAPS Take 1,000 mg by mouth daily after supper.    . ondansetron (ZOFRAN) 4 MG tablet Take 1 tablet (4 mg total) by mouth every 8 (eight) hours as needed for nausea or vomiting. 10 tablet 0  . simvastatin (ZOCOR) 40 MG tablet TAKE ONE TABLET BY MOUTH ONCE DAILY AFTER  SUPPER (Patient taking differently: Take 40 mg by mouth daily after supper. ) 90 tablet 2  . vitamin B-12 (CYANOCOBALAMIN) 1000 MCG tablet Take 1 tablet (1,000 mcg total) by mouth daily. (Patient taking differently: Take 1,000 mcg by mouth daily after supper. ) 90 tablet 0  . warfarin (COUMADIN) 3 MG tablet Take 1-2 tablets (3-6 mg total) by mouth See admin instructions. 4.5 mg every day except on Tuesday patient takes 3 mg. 40 tablet 2   No current facility-administered medications for  this visit.     Review of Systems  Constitutional:  Constitutional negative. HENT: HENT negative.  Eyes: Eyes negative.  Respiratory: Respiratory negative.  Cardiovascular: Cardiovascular negative.  GI: Gastrointestinal negative.  Musculoskeletal: Musculoskeletal negative.  Skin: Skin negative.  Neurological: Positive for numbness.  Hematologic: Positive for bruises/bleeds easily.  Psychiatric: Psychiatric negative.        Objective:  Objective   Vitals:   08/25/16 0850  BP: 116/83  Pulse: 68  Resp: 20  Temp: 97.2 F (36.2 C)  TempSrc: Oral  SpO2: 96%  Weight: 240 lb 4.8 oz (109 kg)  Height: 5\' 9"  (1.753 m)   Body mass index is 35.49 kg/m.  Physical Exam  Constitutional: He is oriented to person, place, and time. He appears well-developed.  HENT:  Head: Normocephalic.  Eyes: Pupils are equal, round, and reactive to light.  Neck: Normal range of motion.  Cardiovascular: Normal rate.   Pulses:      Posterior tibial pulses are 1+ on the right side.  Left foot is warm and well perfused  Pulmonary/Chest: Effort normal.  Abdominal: Soft. He exhibits no mass.  Musculoskeletal: Normal range of motion. He exhibits no edema.  Lymphadenopathy:    He has no cervical adenopathy.  Neurological: He is alert and oriented to person, place, and time.  Skin: Skin is warm and dry.  Psychiatric: He has a normal mood and affect. His behavior is normal. Judgment and thought content normal.    Data: IMPRESSION: VASCULAR  1. Patent infrarenal aortic stent graft with no endoleak, stable 5.3 cm native aneurysm diameter.     Assessment/Plan:     71 year old male follows up for repair of abdominal aortic aneurysm 2010. He has a stable aneurysm at 5.3 cm. He will follow-up in one year with his carotid studies and endovascular aortic aneurysm evaluation with duplex with our nurse practitioner. Should he have issues with back or abdominal pain before that we will consider an ACM  sooner.     Waynetta Sandy MD [facility]

## 2016-09-01 ENCOUNTER — Ambulatory Visit (INDEPENDENT_AMBULATORY_CARE_PROVIDER_SITE_OTHER): Payer: Medicare HMO

## 2016-09-01 DIAGNOSIS — I480 Paroxysmal atrial fibrillation: Secondary | ICD-10-CM | POA: Diagnosis not present

## 2016-09-01 DIAGNOSIS — Z5181 Encounter for therapeutic drug level monitoring: Secondary | ICD-10-CM | POA: Diagnosis not present

## 2016-09-01 LAB — POCT INR: INR: 2.1

## 2016-09-06 ENCOUNTER — Encounter (HOSPITAL_COMMUNITY): Payer: Self-pay

## 2016-09-06 ENCOUNTER — Inpatient Hospital Stay (HOSPITAL_COMMUNITY)
Admission: EM | Admit: 2016-09-06 | Discharge: 2016-09-09 | DRG: 281 | Disposition: A | Discharge status: Home / Self Care | Payer: Medicare HMO | Attending: Internal Medicine | Admitting: Internal Medicine

## 2016-09-06 ENCOUNTER — Emergency Department (HOSPITAL_COMMUNITY): Payer: Medicare HMO

## 2016-09-06 DIAGNOSIS — I2510 Atherosclerotic heart disease of native coronary artery without angina pectoris: Secondary | ICD-10-CM | POA: Diagnosis not present

## 2016-09-06 DIAGNOSIS — Z951 Presence of aortocoronary bypass graft: Secondary | ICD-10-CM | POA: Diagnosis not present

## 2016-09-06 DIAGNOSIS — I200 Unstable angina: Secondary | ICD-10-CM

## 2016-09-06 DIAGNOSIS — T82858A Stenosis of vascular prosthetic devices, implants and grafts, initial encounter: Principal | ICD-10-CM | POA: Diagnosis present

## 2016-09-06 DIAGNOSIS — I214 Non-ST elevation (NSTEMI) myocardial infarction: Secondary | ICD-10-CM | POA: Diagnosis present

## 2016-09-06 DIAGNOSIS — Z8679 Personal history of other diseases of the circulatory system: Secondary | ICD-10-CM

## 2016-09-06 DIAGNOSIS — N183 Chronic kidney disease, stage 3 unspecified: Secondary | ICD-10-CM | POA: Diagnosis present

## 2016-09-06 DIAGNOSIS — I6782 Cerebral ischemia: Secondary | ICD-10-CM | POA: Diagnosis not present

## 2016-09-06 DIAGNOSIS — I129 Hypertensive chronic kidney disease with stage 1 through stage 4 chronic kidney disease, or unspecified chronic kidney disease: Secondary | ICD-10-CM | POA: Diagnosis present

## 2016-09-06 DIAGNOSIS — Z87891 Personal history of nicotine dependence: Secondary | ICD-10-CM

## 2016-09-06 DIAGNOSIS — Z9889 Other specified postprocedural states: Secondary | ICD-10-CM | POA: Diagnosis not present

## 2016-09-06 DIAGNOSIS — Z8249 Family history of ischemic heart disease and other diseases of the circulatory system: Secondary | ICD-10-CM

## 2016-09-06 DIAGNOSIS — G4730 Sleep apnea, unspecified: Secondary | ICD-10-CM | POA: Diagnosis not present

## 2016-09-06 DIAGNOSIS — I712 Thoracic aortic aneurysm, without rupture, unspecified: Secondary | ICD-10-CM

## 2016-09-06 DIAGNOSIS — R079 Chest pain, unspecified: Secondary | ICD-10-CM | POA: Diagnosis not present

## 2016-09-06 DIAGNOSIS — J449 Chronic obstructive pulmonary disease, unspecified: Secondary | ICD-10-CM | POA: Diagnosis not present

## 2016-09-06 DIAGNOSIS — J309 Allergic rhinitis, unspecified: Secondary | ICD-10-CM | POA: Diagnosis present

## 2016-09-06 DIAGNOSIS — Y832 Surgical operation with anastomosis, bypass or graft as the cause of abnormal reaction of the patient, or of later complication, without mention of misadventure at the time of the procedure: Secondary | ICD-10-CM | POA: Diagnosis present

## 2016-09-06 DIAGNOSIS — R1013 Epigastric pain: Secondary | ICD-10-CM

## 2016-09-06 DIAGNOSIS — I480 Paroxysmal atrial fibrillation: Secondary | ICD-10-CM | POA: Diagnosis not present

## 2016-09-06 DIAGNOSIS — I10 Essential (primary) hypertension: Secondary | ICD-10-CM | POA: Diagnosis present

## 2016-09-06 DIAGNOSIS — Z79899 Other long term (current) drug therapy: Secondary | ICD-10-CM

## 2016-09-06 DIAGNOSIS — I708 Atherosclerosis of other arteries: Secondary | ICD-10-CM | POA: Diagnosis present

## 2016-09-06 DIAGNOSIS — I25810 Atherosclerosis of coronary artery bypass graft(s) without angina pectoris: Secondary | ICD-10-CM | POA: Diagnosis present

## 2016-09-06 DIAGNOSIS — K219 Gastro-esophageal reflux disease without esophagitis: Secondary | ICD-10-CM | POA: Diagnosis present

## 2016-09-06 DIAGNOSIS — Z6833 Body mass index (BMI) 33.0-33.9, adult: Secondary | ICD-10-CM

## 2016-09-06 DIAGNOSIS — Z888 Allergy status to other drugs, medicaments and biological substances status: Secondary | ICD-10-CM | POA: Diagnosis not present

## 2016-09-06 DIAGNOSIS — Z8673 Personal history of transient ischemic attack (TIA), and cerebral infarction without residual deficits: Secondary | ICD-10-CM

## 2016-09-06 DIAGNOSIS — G629 Polyneuropathy, unspecified: Secondary | ICD-10-CM | POA: Diagnosis present

## 2016-09-06 DIAGNOSIS — I6522 Occlusion and stenosis of left carotid artery: Secondary | ICD-10-CM | POA: Diagnosis not present

## 2016-09-06 DIAGNOSIS — Z823 Family history of stroke: Secondary | ICD-10-CM

## 2016-09-06 DIAGNOSIS — R001 Bradycardia, unspecified: Secondary | ICD-10-CM | POA: Diagnosis present

## 2016-09-06 DIAGNOSIS — Z833 Family history of diabetes mellitus: Secondary | ICD-10-CM

## 2016-09-06 DIAGNOSIS — G4733 Obstructive sleep apnea (adult) (pediatric): Secondary | ICD-10-CM | POA: Diagnosis not present

## 2016-09-06 DIAGNOSIS — Z7901 Long term (current) use of anticoagulants: Secondary | ICD-10-CM

## 2016-09-06 DIAGNOSIS — R7303 Prediabetes: Secondary | ICD-10-CM | POA: Diagnosis present

## 2016-09-06 DIAGNOSIS — E785 Hyperlipidemia, unspecified: Secondary | ICD-10-CM | POA: Diagnosis present

## 2016-09-06 HISTORY — DX: Personal history of other medical treatment: Z92.89

## 2016-09-06 HISTORY — DX: Low back pain, unspecified: M54.50

## 2016-09-06 HISTORY — DX: Low back pain: M54.5

## 2016-09-06 HISTORY — DX: Prediabetes: R73.03

## 2016-09-06 HISTORY — DX: Blindness, one eye, unspecified eye: H54.40

## 2016-09-06 HISTORY — DX: Other chronic pain: G89.29

## 2016-09-06 HISTORY — DX: Cerebral infarction, unspecified: I63.9

## 2016-09-06 LAB — CBC
HEMATOCRIT: 40.3 % (ref 39.0–52.0)
Hemoglobin: 13.4 g/dL (ref 13.0–17.0)
MCH: 29.5 pg (ref 26.0–34.0)
MCHC: 33.3 g/dL (ref 30.0–36.0)
MCV: 88.8 fL (ref 78.0–100.0)
Platelets: 138 10*3/uL — ABNORMAL LOW (ref 150–400)
RBC: 4.54 MIL/uL (ref 4.22–5.81)
RDW: 14.1 % (ref 11.5–15.5)
WBC: 4 10*3/uL (ref 4.0–10.5)

## 2016-09-06 LAB — I-STAT TROPONIN, ED: Troponin i, poc: 0 ng/mL (ref 0.00–0.08)

## 2016-09-06 LAB — TROPONIN I
Troponin I: 0.85 ng/mL (ref <0.03)
Troponin I: 2.91 ng/mL (ref <0.03)
Troponin I: 2.89 ng/mL (ref <0.03)

## 2016-09-06 LAB — BASIC METABOLIC PANEL
Anion gap: 11 (ref 5–15)
BUN: 19 mg/dL (ref 6–20)
CHLORIDE: 105 mmol/L (ref 101–111)
CO2: 24 mmol/L (ref 22–32)
Calcium: 9.2 mg/dL (ref 8.9–10.3)
Creatinine, Ser: 1.57 mg/dL — ABNORMAL HIGH (ref 0.61–1.24)
GFR calc Af Amer: 49 mL/min — ABNORMAL LOW (ref >60)
GFR calc non Af Amer: 42 mL/min — ABNORMAL LOW (ref >60)
Glucose, Bld: 125 mg/dL — ABNORMAL HIGH (ref 65–99)
POTASSIUM: 3.9 mmol/L (ref 3.5–5.1)
SODIUM: 140 mmol/L (ref 135–145)

## 2016-09-06 LAB — I-STAT CHEM 8, ED
BUN: 24 mg/dL — AB (ref 6–20)
CALCIUM ION: 1.16 mmol/L (ref 1.15–1.40)
CHLORIDE: 105 mmol/L (ref 101–111)
CREATININE: 1.6 mg/dL — AB (ref 0.61–1.24)
Glucose, Bld: 123 mg/dL — ABNORMAL HIGH (ref 65–99)
HEMATOCRIT: 40 % (ref 39.0–52.0)
Hemoglobin: 13.6 g/dL (ref 13.0–17.0)
Potassium: 3.7 mmol/L (ref 3.5–5.1)
SODIUM: 142 mmol/L (ref 135–145)
TCO2: 26 mmol/L (ref 0–100)

## 2016-09-06 LAB — PROTIME-INR
INR: 1.73
PROTHROMBIN TIME: 20.5 s — AB (ref 11.4–15.2)

## 2016-09-06 LAB — LIPASE, BLOOD: Lipase: 31 U/L (ref 11–51)

## 2016-09-06 LAB — GLUCOSE, CAPILLARY
Glucose-Capillary: 95 mg/dL (ref 65–99)
Glucose-Capillary: 120 mg/dL — ABNORMAL HIGH (ref 65–99)

## 2016-09-06 LAB — HEPATIC FUNCTION PANEL
ALBUMIN: 4 g/dL (ref 3.5–5.0)
ALT: 17 U/L (ref 17–63)
AST: 33 U/L (ref 15–41)
Alkaline Phosphatase: 73 U/L (ref 38–126)
BILIRUBIN TOTAL: 0.9 mg/dL (ref 0.3–1.2)
Bilirubin, Direct: 0.2 mg/dL (ref 0.1–0.5)
Indirect Bilirubin: 0.7 mg/dL (ref 0.3–0.9)
Total Protein: 6.9 g/dL (ref 6.5–8.1)

## 2016-09-06 LAB — HEPARIN LEVEL (UNFRACTIONATED): Heparin Unfractionated: 0.19 IU/mL — ABNORMAL LOW (ref 0.30–0.70)

## 2016-09-06 MED ORDER — ASPIRIN 81 MG PO CHEW
81.0000 mg | CHEWABLE_TABLET | ORAL | Status: AC
  Administered 2016-09-07: 81 mg via ORAL
  Filled 2016-09-06: qty 1

## 2016-09-06 MED ORDER — ALBUTEROL SULFATE (2.5 MG/3ML) 0.083% IN NEBU: 3.0000 mL | INHALATION_SOLUTION | Freq: Four times a day (QID) | RESPIRATORY_TRACT | Status: DC | PRN

## 2016-09-06 MED ORDER — GUAIFENESIN-DM 100-10 MG/5ML PO SYRP
5.0000 mL | ORAL_SOLUTION | ORAL | Status: DC | PRN
  Administered 2016-09-07: 5 mL via ORAL
  Filled 2016-09-06: qty 5

## 2016-09-06 MED ORDER — ASPIRIN 325 MG PO TABS: 325.0000 mg | ORAL_TABLET | Freq: Every day | ORAL | Status: DC

## 2016-09-06 MED ORDER — IPRATROPIUM-ALBUTEROL 0.5-2.5 (3) MG/3ML IN SOLN
3.0000 mL | Freq: Once | RESPIRATORY_TRACT | Status: AC
  Administered 2016-09-06: 3 mL via RESPIRATORY_TRACT
  Filled 2016-09-06: qty 3

## 2016-09-06 MED ORDER — SODIUM CHLORIDE 0.9 % WEIGHT BASED INFUSION
100.0000 mL/h | INTRAVENOUS | Status: DC
  Administered 2016-09-06 – 2016-09-07 (×2): 100 mL/h via INTRAVENOUS

## 2016-09-06 MED ORDER — ACETAMINOPHEN 650 MG RE SUPP
650.0000 mg | Freq: Four times a day (QID) | RECTAL | Status: DC | PRN
Start: 2016-09-06 — End: 2016-09-08

## 2016-09-06 MED ORDER — IPRATROPIUM-ALBUTEROL 0.5-2.5 (3) MG/3ML IN SOLN
3.0000 mL | Freq: Four times a day (QID) | RESPIRATORY_TRACT | Status: DC | PRN
  Administered 2016-09-07: 3 mL via RESPIRATORY_TRACT
  Filled 2016-09-06: qty 3

## 2016-09-06 MED ORDER — LORATADINE 10 MG PO TABS: 10.0000 mg | ORAL_TABLET | Freq: Every day | ORAL | Status: DC

## 2016-09-06 MED ORDER — IOPAMIDOL (ISOVUE-370) INJECTION 76%
INTRAVENOUS | Status: AC
  Filled 2016-09-06: qty 100

## 2016-09-06 MED ORDER — SIMVASTATIN 40 MG PO TABS
40.0000 mg | ORAL_TABLET | Freq: Every day | ORAL | Status: DC
  Administered 2016-09-06 – 2016-09-07 (×2): 40 mg via ORAL
  Filled 2016-09-06 (×2): qty 1

## 2016-09-06 MED ORDER — HEPARIN (PORCINE) IN NACL 100-0.45 UNIT/ML-% IJ SOLN
1500.0000 [IU]/h | INTRAMUSCULAR | Status: DC
  Administered 2016-09-06: 1200 [IU]/h via INTRAVENOUS
  Administered 2016-09-07: 1500 [IU]/h via INTRAVENOUS
  Filled 2016-09-06 (×2): qty 250

## 2016-09-06 MED ORDER — ACETAMINOPHEN 325 MG PO TABS
650.0000 mg | ORAL_TABLET | Freq: Four times a day (QID) | ORAL | Status: DC | PRN
  Administered 2016-09-06: 650 mg via ORAL
  Filled 2016-09-06: qty 2

## 2016-09-06 MED ORDER — SODIUM CHLORIDE 0.9% FLUSH: 3.0000 mL | INTRAVENOUS | Status: DC | PRN

## 2016-09-06 MED ORDER — NITROGLYCERIN 2 % TD OINT
1.0000 [in_us] | TOPICAL_OINTMENT | Freq: Four times a day (QID) | TRANSDERMAL | Status: DC
  Administered 2016-09-06 – 2016-09-08 (×9): 1 [in_us] via TOPICAL
  Filled 2016-09-06 (×3): qty 30
  Filled 2016-09-06: qty 1
  Filled 2016-09-06 (×2): qty 30

## 2016-09-06 MED ORDER — SODIUM CHLORIDE 0.9 % IV SOLN: 250.0000 mL | INTRAVENOUS | Status: DC | PRN

## 2016-09-06 MED ORDER — IOPAMIDOL (ISOVUE-370) INJECTION 76%
INTRAVENOUS | Status: AC
  Administered 2016-09-06: 80 mL
  Filled 2016-09-06: qty 100

## 2016-09-06 MED ORDER — FLUTICASONE PROPIONATE 50 MCG/ACT NA SUSP: 2.0000 | Freq: Every day | NASAL | Status: DC

## 2016-09-06 MED ORDER — SODIUM CHLORIDE 0.9% FLUSH
3.0000 mL | Freq: Two times a day (BID) | INTRAVENOUS | Status: DC
  Administered 2016-09-06 – 2016-09-07 (×3): 3 mL via INTRAVENOUS

## 2016-09-06 NOTE — ED Provider Notes (Signed)
[facility] DEPT Provider Note   CSN: [id number] Arrival date & time: 09/06/16  0449     History   Chief Complaint Chief Complaint  Patient presents with  . Chest Pain    HPI Dylan Morgan is a 71 y.o. male.  Patient presents to the emergency department for evaluation of chest pain. Patient reports that he was awakened from sleep with pain in his left chest that radiates to the left arm. Patient was brought to the ER by ambulance. EMS report significant hypertension initially which improved with nitroglycerin. His pain also improved with nitroglycerin. Patient reports a history of bypass surgery in 2004.  Patient is short of breath. He does endorse some wheezing, does have a history of COPD. Patient also noticing pain in the center of his upper abdomen which has been persistent and constant since he awakened as well.      Past Medical History:  Diagnosis Date  . AAA (abdominal aortic aneurysm) ([place])    a. 2010 s/p stenting  . Arthritis   . Bradycardia   . CAD (coronary artery disease) CABG in 2004   a. 2004 s/p CABG  . Carotid artery disease ([place])    a. 2012 dopplers with old LICA occlusion , RICA no significant abnormality  . CVA (cerebral infarction)    a. 2013 R Carona radiata stroke   . GERD (gastroesophageal reflux disease)   . Hyperlipidemia   . Hypertension   . Paroxysmal atrial fibrillation ([place]) 02/22/2009   a. on Coumadin   . Prediabetes   . Renal insufficiency   . Seborrheic dermatitis of scalp   . Tobacco abuse     Patient Active Problem List   Diagnosis Date Noted  . COPD exacerbation ([place])   . Viral URI with cough 07/08/2016  . BPPV (benign paroxysmal positional vertigo), left 07/04/2016  . Vitamin B 12 deficiency 05/02/2016  . Burning with urination 05/02/2016  . Closed fracture of left tibial plateau 01/18/2016  . Carpal tunnel syndrome, bilateral 01/11/2016  . S/P AAA (abdominal aortic aneurysm) repair 06/21/2014  . Acute URI 06/19/2014    . Peripheral neuropathy 01/16/2014  . Onychomycosis 01/10/2013  . Chronic anticoagulation 05/01/2012  . Neck pain, chronic 03/13/2012  . H/O TIA (transient ischemic attack) and stroke 01/02/2012  . Occlusion and stenosis of carotid artery without mention of cerebral infarction 12/19/2011  . TIA (transient ischemic attack) 11/23/2011  . Preventative health care 09/20/2011  . CVA (cerebral infarction)   . Prediabetes   . Long term (current) use of anticoagulants 08/04/2011  . Carotid artery disease ([place])   . CAD (coronary artery disease)   . Hypertension   . Hyperlipidemia   . Hx of CABG   . AAA (abdominal aortic aneurysm) ([place])   . Erectile dysfunction 02/06/2011  . Paroxysmal atrial fibrillation ([place]) 02/22/2009  . Depression 02/04/2007  . SLEEP APNEA 02/04/2007  . CKD (chronic kidney disease), stage III 02/20/2006    Past Surgical History:  Procedure Laterality Date  . ABDOMINAL AORTIC ANEURYSM REPAIR  2010   Aortic stent repair  . CORONARY ARTERY BYPASS GRAFT  2004  . ROTATOR CUFF REPAIR  1990       Home Medications    Prior to Admission medications   Medication Sig Start Date End Date Taking? Authorizing Provider  albuterol (PROVENTIL HFA;VENTOLIN HFA) 108 (90 Base) MCG/ACT inhaler Inhale 2 puffs into the lungs every 6 (six) hours as needed for wheezing or shortness of breath. 07/09/16  Yes Asencion Partridge,  MD  gabapentin (NEURONTIN) 100 MG capsule Take 1 capsule (100 mg total) by mouth daily. 07/13/16  Yes Jule Ser, DO  lisinopril (PRINIVIL,ZESTRIL) 5 MG tablet Take 1 tablet (5 mg total) by mouth daily. 08/01/16 08/01/17 Yes Ahmed, Chesley Mires, MD  Multiple Vitamin (MULTIVITAMIN) tablet Take 1 tablet by mouth daily. 02/29/16  Yes Patel, Rayne Du, MD  simvastatin (ZOCOR) 40 MG tablet TAKE ONE TABLET BY MOUTH ONCE DAILY AFTER  SUPPER Patient taking differently: Take 40 mg by mouth daily after supper.  02/29/16  Yes Riccardo Dubin, MD  vitamin B-12 (CYANOCOBALAMIN) 1000 MCG  tablet Take 1 tablet (1,000 mcg total) by mouth daily. Patient taking differently: Take 1,000 mcg by mouth daily after supper.  02/29/16  Yes Riccardo Dubin, MD  warfarin (COUMADIN) 3 MG tablet Take 1-2 tablets (3-6 mg total) by mouth See admin instructions. 4.5 mg every day except on Tuesday patient takes 3 mg. 07/21/16  Yes Camnitz, Will Hassell Done, MD  fluticasone [facility]) 50 MCG/ACT nasal spray Place 2 sprays into both nostrils daily. Patient not taking: Reported on 09/06/2016 07/13/16   Jule Ser, DO  guaiFENesin (MUCINEX) 600 MG 12 hr tablet Take 1 tablet (600 mg total) by mouth 2 (two) times daily as needed. Patient not taking: Reported on 09/06/2016 07/09/16   Velna Ochs, MD  loratadine (CLARITIN) 10 MG tablet Take 1 tablet (10 mg total) by mouth daily. Patient not taking: Reported on 09/06/2016 07/13/16 07/13/17  Jule Ser, DO  ondansetron (ZOFRAN) 4 MG tablet Take 1 tablet (4 mg total) by mouth every 8 (eight) hours as needed for nausea or vomiting. Patient not taking: Reported on 09/06/2016 07/02/16   Rolland Porter, MD    Family History Family History  Problem Relation Age of Onset  . Diabetes Mother   . Stroke Father   . Thyroid cancer Sister   . Heart disease Brother        Coronary artery disease  . Heart attack Brother   . Heart attack Brother     Social History Social History  Substance Use Topics  . Smoking status: Former Smoker    Packs/day: 0.00    Years: 35.00    Types: Cigarettes    Quit date: 02/03/2010  . Smokeless tobacco: Never Used  . Alcohol use No     Allergies   Diltiazem hcl   Review of Systems Review of Systems  Respiratory: Positive for shortness of breath and wheezing.   Cardiovascular: Positive for chest pain.  Gastrointestinal: Positive for abdominal pain.  All other systems reviewed and are negative.    Physical Exam Updated Vital Signs BP 140/85   Pulse (!) 52   Temp 97.7 F (36.5 C) (Oral)   Resp 14   SpO2 99%   Physical  Exam  Constitutional: He is oriented to person, place, and time. He appears well-developed and well-nourished. No distress.  HENT:  Head: Normocephalic and atraumatic.  Right Ear: Hearing normal.  Left Ear: Hearing normal.  Nose: Nose normal.  Mouth/Throat: Oropharynx is clear and moist and mucous membranes are normal.  Eyes: Conjunctivae and EOM are normal. Pupils are equal, round, and reactive to light.  Neck: Normal range of motion. Neck supple.  Cardiovascular: Regular rhythm, S1 normal and S2 normal.  Exam reveals no gallop and no friction rub.   No murmur heard. Pulmonary/Chest: Effort normal. No respiratory distress. He has wheezes. He exhibits no tenderness.  Abdominal: Soft. Normal appearance and bowel sounds are normal. There is no  hepatosplenomegaly. There is tenderness in the epigastric area. There is no rebound, no guarding, no tenderness at McBurney's point and negative Murphy's sign. No hernia.  Musculoskeletal: Normal range of motion.  Neurological: He is alert and oriented to person, place, and time. He has normal strength. No cranial nerve deficit or sensory deficit. Coordination normal. GCS eye subscore is 4. GCS verbal subscore is 5. GCS motor subscore is 6.  Skin: Skin is warm, dry and intact. No rash noted. No cyanosis.  Psychiatric: He has a normal mood and affect. His speech is normal and behavior is normal. Thought content normal.  Nursing note and vitals reviewed.    ED Treatments / Results  Labs (all labs ordered are listed, but only abnormal results are displayed) Labs Reviewed  BASIC METABOLIC PANEL - Abnormal; Notable for the following:       Result Value   Glucose, Bld 125 (*)    Creatinine, Ser 1.57 (*)    GFR calc non Af Amer 42 (*)    GFR calc Af Amer 49 (*)    All other components within normal limits  CBC - Abnormal; Notable for the following:    Platelets 138 (*)    All other components within normal limits  I-STAT CHEM 8, ED - Abnormal;  Notable for the following:    BUN 24 (*)    Creatinine, Ser 1.60 (*)    Glucose, Bld 123 (*)    All other components within normal limits  HEPATIC FUNCTION PANEL  LIPASE, BLOOD  I-STAT TROPOININ, ED    EKG  EKG Interpretation  Date/Time:  Wednesday September 06 2016 04:55:48 EDT Ventricular Rate:  56 PR Interval:    QRS Duration: 107 QT Interval:  456 QTC Calculation: 441 R Axis:   -42 Text Interpretation:  Sinus rhythm Incomplete RBBB and LAFB Low voltage, precordial leads Consider RVH w/ secondary repol abnormality Nonspecific T abnormalities, lateral leads No significant change since last tracing Confirmed by Orpah Greek [phone]) on 09/06/2016 5:03:57 AM Also confirmed by Orpah Greek [phone]), editor Verna Czech [phone])  on 09/06/2016 7:49:48 AM       Radiology No results found.  Procedures Procedures (including critical care time)  Medications Ordered in ED Medications  nitroGLYCERIN (NITROGLYN) 2 % ointment 1 inch (1 inch Topical Given 09/06/16 0508)  ipratropium-albuterol (DUONEB) 0.5-2.5 (3) MG/3ML nebulizer solution 3 mL (3 mLs Nebulization Given 09/06/16 0508)  iopamidol (ISOVUE-370) 76 % injection (80 mLs  Contrast Given 09/06/16 0616)     Initial Impression / Assessment and Plan / ED Course  I have reviewed the triage vital signs and the nursing notes.  Pertinent labs & imaging results that were available during my care of the patient were reviewed by me and considered in my medical decision making (see chart for details).     Presents for evaluation of chest pain. Patient reports onset of pain in the left side of his chest radiating to his arm that awakened him from sleep. He reports that this pain is similar to pain that he had with cardiac disease prior to bypass in 2004. He received nitroglycerin with some improvement. He was also extremely hypertensive for EMS. This has resolved with sublingual and then transdermal nitroglycerin.  Patient has a  nondiagnostic EKG, no obvious ischemia or infarct. Initial troponin negative. Will require admission for further cardiac evaluation secondary to his history. He does, however, have abdominal pain. He has a history of endovascular repair of AAA. He will require  CT angiography to rule out aortic etiology of his pains prior to admission. Will sign out to oncoming ER physician to follow-up on CT prior to admission to internal medicine teaching service for cardiac evaluation.  Patient did have wheezing upon arrival. This was treated with DuoNeb. He has a history of COPD.  Final Clinical Impressions(s) / ED Diagnoses   Final diagnoses:  Chest pain, unspecified type  Chronic obstructive pulmonary disease, unspecified COPD type ([place])  Epigastric pain  Essential hypertension    New Prescriptions New Prescriptions   No medications on file     Orpah Greek, MD 09/06/16 [phone]

## 2016-09-06 NOTE — Progress Notes (Signed)
ANTICOAGULATION CONSULT NOTE - Follow Up Consult  Pharmacy Consult for heparin Indication: chest pain/ACS and atrial fibrillation  Labs:  Recent Labs  09/06/16 0455 09/06/16 0516 09/06/16 1138 09/06/16 1321 09/06/16 1931 09/06/16 2258  HGB 13.4 13.6  --   --   --   --   HCT 40.3 40.0  --   --   --   --   PLT 138*  --   --   --   --   --   LABPROT  --   --   --  20.5*  --   --   INR  --   --   --  1.73  --   --   HEPARINUNFRC  --   --   --   --   --  0.19*  CREATININE 1.57* 1.60*  --   --   --   --   TROPONINI  --   --  0.85*  --  2.91*  --     Assessment: 71yo male subtherapeutic on heparin with initial dosing while Coumadin on hold.  Goal of Therapy:  Heparin level 0.3-0.7 units/ml   Plan:  Will increase heparin gtt by 3 units/kg/hr to 1500 units/hr and check level in [place], PharmD, BCPS  09/06/2016,11:40 PM

## 2016-09-06 NOTE — ED Notes (Signed)
Attempted to call report

## 2016-09-06 NOTE — ED Notes (Signed)
Troponin 0.85 MD Posey Pronto made aware

## 2016-09-06 NOTE — Progress Notes (Signed)
   09/06/16 1528  Clinical Encounter Type  Visited With Patient  Visit Type Initial  Referral From Nurse  Consult/Referral To Chaplain  Stress Factors  Patient Stress Factors None identified  Pt did not remember requesting a Chaplain nor prayer request.  No needs assessed.  Advise Pt that if needed any further assistance from spiritual wellness pastoral care, to let nurse know, to call.  Chaplain Miqueas Whilden A. Nonnie Done, MA-PC, [phone]

## 2016-09-06 NOTE — Consult Note (Addendum)
The patient has been seen in conjunction with Dylan Pel, PA-C. All aspects of care have been considered and discussed. The patient has been personally interviewed, examined, and all clinical data has been reviewed.   The patient has a history of coronary artery disease with CABG 3 (grafts unknown- done in [place].) in 2004. He presents now with acute coronary syndrome/non-ST elevation myocardial infarction. He is currently pain-free. He has a very foul mouth.  He has chronic kidney disease stage III, a chronically occluded left internal carotid, 75% stenosis at the ostium of the right subclavian, paroxysmal atrial fibrillation, chronic anticoagulation, prior CVA, and abdominal aortic aneurysm with stent graft in 2010.  The chart states he is DO NOT RESUSCITATE. He wishes to be FULL CODE.  Morbidly obese with great left radial pulse.  Plan coronary angiography tomorrow if INR remains less than 1.8. Coumadin will not be given today. IV hydration will begin tonight.   Cardiology Consultation:   Patient ID: Dylan Morgan; [id number]; 1945/12/25   Admit date: 09/06/2016 Date of Consult: 09/06/2016  Primary Care Provider: Earl Lagos, MD Primary Cardiologist: Dr. Eldridge Morgan Primary Electrophysiologist:  None   Patient Profile:                                                                         Dylan Morgan is a 71 y.o. male with a hx of AAA (s/p stenting in 2010), arthritis, bradycardia, CAD (s/p CABG 2004), CVA R Dylan Morgan stroke (2013 or 2014), GERD, hyperlipidemia, hypertension, paroxysmal atrial fibrillation 2010, renal insufficiency, tobacco abuse, B12 deficiency who is being seen today for the evaluation of chest pain at the request of Dr. Mikey Morgan.  History of Present Illness:   Dylan Morgan is on chronic coumadin for his a fib and gets his check regularly. He see's Dr. Eldridge Morgan but has not seen him in office in about 1 year. He has been following up closely  with vascular surgery to monitor his aortic stent graft that was placed in 2010 for abdominal aortic aneurysm and known left ICA occlusion. As of 08/25/2016, he saw Dylan Morgan in the office and had a stable 5.3 cm aneurysm, he was instructed that if he were to have back pain or abdominal pain that they would consider Dylan Morgan.  He presented to the ER this admission because of sudden left sided chest pain. He was able to get himself ready but was unable to get down stairs and EMS was called. His pain improved with SL and aspirin to 2-3/10.    Normal CBG,  creatinine 1.60,  INR 1.73, no UTI.   POC trop negative and delta Troponin I 0.85.  CT angio of the chest and abdomen shows significant stenosis at the origin of the right subclavian artery approaching 70-75% in est caliber. Shows AAA.   EKG: no ischemic chagnes.   Past Medical History:  Diagnosis Date  . AAA (abdominal aortic aneurysm) (HCC)    a. 2010 s/p stenting  . Arthritis   . Bradycardia   . CAD (coronary artery disease) CABG in 2004   a. 2004 s/p CABG  . Carotid artery disease (HCC)    a. 2012 dopplers with old LICA occlusion , RICA no  significant abnormality  . CVA (cerebral infarction)    a. 2013 R Dylan radiata stroke   . GERD (gastroesophageal reflux disease)   . Hyperlipidemia   . Hypertension   . Paroxysmal atrial fibrillation (HCC) 02/22/2009   a. on Coumadin   . Prediabetes   . Renal insufficiency   . Seborrheic dermatitis of scalp   . Tobacco abuse     Past Surgical History:  Procedure Laterality Date  . ABDOMINAL AORTIC ANEURYSM REPAIR  2010   Aortic stent repair  . CORONARY ARTERY BYPASS GRAFT  2004  . ROTATOR CUFF REPAIR  1990     Inpatient Medications: Scheduled Meds: . aspirin  325 mg Oral Daily  . iopamidol      . nitroGLYCERIN  1 inch Topical Q6H  . simvastatin  40 mg Oral QPC supper   Continuous Infusions: . heparin 1,200 Units/hr (09/06/16 1416)   PRN Meds: albuterol  Allergies:    Allergies   Allergen Reactions  . Diltiazem Hcl Itching    Social History:   Social History   Social History  . Marital status: Single    Spouse name: N/A  . Number of children: 4  . Years of education: 8   Occupational History  . Retired    Social History Main Topics  . Smoking status: Former Smoker    Packs/day: 0.00    Years: 35.00    Types: Cigarettes    Quit date: 02/03/2010  . Smokeless tobacco: Never Used  . Alcohol use No  . Drug use: No  . Sexual activity: Not Currently   Other Topics Concern  . Not on file   Social History Narrative   Lives with son and his significant other, denies being married. Has 3 daughters & a son.  Several grandchildren.    Divorced.   Regular exercise.   Prior to retirement, hung dry wall for a living.    Right-handed   Caffeine: occasional soft drink    Family History:   The patient's family history includes Diabetes in his mother; Heart attack in his brother and brother; Heart disease in his brother; Stroke in his father; Thyroid cancer in his sister.  ROS:  Please see the history of present illness.  All other ROS reviewed and negative.     Physical Exam/Data:   Vitals:   09/06/16 1245 09/06/16 1315 09/06/16 1345 09/06/16 1400  BP: (!) 168/78 112/72 (!) 156/93 (!) 142/91  Pulse: (!) 46 (!) 49 (!) 58 (!) 50  Resp: 15 15 18 14   Temp:      TempSrc:      SpO2: 100% 100% 98% 98%  Weight:      Height:        Intake/Output Summary (Last 24 hours) at 09/06/16 1420 Last data filed at 09/06/16 0814  Gross per 24 hour  Intake                0 ml  Output              150 ml  Net             -150 ml   Filed Weights   09/06/16 1216  Weight: 240 lb 4.8 oz (109 kg)   Body mass index is 35.49 kg/m.  General: Well developed, well nourished, in no acute distress. Head: Normocephalic, atraumatic, sclera non-icteric, no xanthomas, nares are without discharge.  Neck: Negative for carotid bruits. JVD not elevated. Lungs: Clear  bilaterally to auscultation without  wheezes, rales, or rhonchi. Breathing is unlabored. Heart: RRR with S1 S2. No murmurs, rubs, or gallops appreciated. Abdomen: Soft, non-tender, non-distended with normoactive bowel sounds. No hepatomegaly. No rebound/guarding. No obvious abdominal masses. Msk:  Strength and tone appear normal for age. Extremities: No clubbing or cyanosis. No edema.  Distal pedal pulses are 2+ and equal bilaterally. Neuro: Alert and oriented X 3. No facial asymmetry. No focal deficit. Moves all extremities spontaneously. Psych:  Responds to questions appropriately with a normal affect.  EKG:  The EKG was personally reviewed and demonstrates HR 56, sinus rhythm RBBB and LAFB  Relevant CV Studies:  Echo 11/24/2011 Study Conclusions  Left ventricle: The cavity size was normal. Wall thickness was increased in a pattern of moderate LVH. Systolic function was vigorous. The estimated ejection fraction was in the range of 65% to 70%. Wall motion was normal; there were no regional wall motion abnormalities. Left ventricular diastolic function parameters were normal.     Laboratory Data:  Chemistry Recent Labs Lab 09/06/16 0455 09/06/16 0516  NA 140 142  K 3.9 3.7  CL 105 105  CO2 24  --   GLUCOSE 125* 123*  BUN 19 24*  CREATININE 1.57* 1.60*  CALCIUM 9.2  --   GFRNONAA 42*  --   GFRAA 49*  --   ANIONGAP 11  --      Recent Labs Lab 09/06/16 0455  PROT 6.9  ALBUMIN 4.0  AST 33  ALT 17  ALKPHOS 73  BILITOT 0.9   Hematology Recent Labs Lab 09/06/16 0455 09/06/16 0516  WBC 4.0  --   RBC 4.54  --   HGB 13.4 13.6  HCT 40.3 40.0  MCV 88.8  --   MCH 29.5  --   MCHC 33.3  --   RDW 14.1  --   PLT 138*  --    Cardiac Enzymes Recent Labs Lab 09/06/16 1138  TROPONINI 0.85*    Recent Labs Lab 09/06/16 0500  TROPIPOC 0.00    BNPNo results for input(s): BNP, PROBNP in the last 168 hours.  DDimer No results for input(s): DDIMER in the last 168  hours.  Radiology/Studies:  Dg Chest 2 View  Result Date: 09/06/2016 CLINICAL DATA:  Chest pain EXAM: CHEST  2 VIEW COMPARISON:  07/08/2016 FINDINGS: Cardiomegaly. Prior CABG. Lungs are clear. No effusions or acute bony abnormality. IMPRESSION: Cardiomegaly.  No active disease. Electronically Signed   By: Charlett Nose M.D.   On: 09/06/2016 09:27   Ct Angio Chest/abd/Morgan For Dissection W And/or Wo Contrast  Result Date: 09/06/2016 CLINICAL DATA:  Chest and abdominal pain. History of prior EVAR in 2010 to treat abdominal aortic aneurysm EXAM: CT ANGIOGRAPHY CHEST, ABDOMEN AND PELVIS TECHNIQUE: Multidetector CT imaging through the chest, abdomen and pelvis was performed using the standard protocol during bolus administration of intravenous contrast. Multiplanar reconstructed images and MIPs were obtained and reviewed to evaluate the vascular anatomy. CONTRAST:  80 mL Isovue 370 IV COMPARISON:  Prior CTA of the abdomen and pelvis on 08/10/2016 as well as multiple prior imaging studies post EVAR. Prior CT of the chest without contrast on 12/15/2015. FINDINGS: CTA CHEST FINDINGS Cardiovascular: The thoracic aorta is well opacified and shows no evidence of acute dissection. There is aneurysmal disease of the ascending thoracic aorta which measures 4.6 cm in greatest diameter. The aorta measures approximately 4.2 cm at the sinuses of Valsalva. Aortic arch and descending thoracic aorta are normal in caliber. Calcified and noncalcified plaque at the origin  of the right subclavian artery causes significant stenosis of approximately 70- 75% narrowing with poststenotic dilatation present. Mild plaque is present in the proximal innominate artery, proximal left common carotid artery and proximal left subclavian artery without significant stenosis. Pulmonary arterial opacification is suboptimal at the timing of the study. There is evidence of prior CABG. The heart size is stable. No pericardial fluid. Mediastinum/Nodes: No  enlarged mediastinal, hilar, or axillary lymph nodes. Thyroid gland, trachea, and esophagus demonstrate no significant findings. Lungs/Pleura: There is no evidence of pulmonary edema, consolidation, pneumothorax, nodule or pleural fluid. 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 Review of the MIP images confirms the above findings. CTA ABDOMEN AND PELVIS FINDINGS VASCULAR Aorta: Stable positioning of aortic endograft since the prior study. There remains evidence of slight distal migration of the proximal end of the endograft, especially along its left infrarenal margin. There is no associated type 1 endoleak. Aneurysm sac is stable measuring 4.7 x 5.3 cm and demonstrating no evidence of type 2 endoleak. Distal common iliac limbs show stable positioning and patency. Since aneurysm repair, there is some gradual increase caliber of the proximal and mid abdominal aorta superior to the endograft. The juxtarenal aorta measures 4.1 cm in maximum diameter and measured 3.5 cm at a comparable level prior to aneurysm repair. The aorta measures 4 cm at the level of the SMA origin. Celiac: Stable moderate stenosis, 50- 60%. SMA: Stable stenosis, 40- 50%. Renals: Stable patency of single right and 2 separate left renal arteries without significant stenosis. IMA: Occluded origin.  Distal branches reconstituted. Inflow: Native iliac arteries show stable patency. There is stable mild aneurysmal dilatation of the proximal right internal iliac artery trunk measuring 13 mm. Common femoral arteries and femoral bifurcations show normal patency. Review of the MIP images confirms the above findings. NON-VASCULAR Hepatobiliary: No focal liver abnormality is seen. No gallstones, gallbladder wall thickening, or biliary dilatation. Pancreas: Unremarkable. No pancreatic ductal dilatation or surrounding inflammatory changes. Spleen: Normal in size without focal abnormality. Adrenals/Urinary Tract: Adrenal  glands are unremarkable. Kidneys are normal, without renal calculi, focal lesion, or hydronephrosis. Bladder is unremarkable. Stomach/Bowel: No abnormality identified involving bowel. No free air. No abnormal fluid collections. Lymphatic: No enlarged lymph nodes identified. Reproductive: Prostate is unremarkable. Other: No abdominal wall hernia or abnormality. No abdominopelvic ascites. Musculoskeletal: Lumbar degenerative disc disease present with associated mild leftward convex scoliosis. Review of the MIP images confirms the above findings. IMPRESSION: 1. Aneurysmal disease of the ascending thoracic aorta measuring up to 4.6 cm in maximum diameter. No thoracic aortic dissection identified. Recommend semi-annual imaging followup by CTA or MRA and referral to cardiothoracic surgery if not already obtained. This recommendation follows 2010 ACCF/AHA/AATS/ACR/ASA/SCA/SCAI/SIR/STS/SVM Guidelines for the Diagnosis and Management of Patients With Thoracic Aortic Disease. Circulation. 2010; 121: e266-e36 2. Significant stenosis at the origin of the right subclavian artery approaching 70- 75% in estimated caliber. Recommend correlation with discrepancy in blood pressure measurement in the upper extremities and with any symptoms of right upper extremity claudication. 3. Stable CTA of the abdomen and pelvis since recent imaging last month. There remains evidence of slight distal migration of the proximal endograft since placement without evidence of endoleak. Aneurysmal disease of the native aorta superior to the endograft does show slow progression since 2010 with enlargement of the juxtarenal and proximal abdominal aorta up to 4.1 cm in maximum diameter. No evidence of abdominal aortic dissection. Electronically Signed   By: Irish Lack M.D.   On: 09/06/2016 10:09  Assessment and Plan:   1. Chest pain with elevated troponin:  (hx CABG) Troponin is  0.85. He was experiencing CP that improved with SL nitro. Most  recent ischemic work -up noted to be in 2015 as well as echo. -- Low risk stress nuclear study with a very small fixed apical defect, likely "apical thinning". A small apical infarction cannot be excluded, especially in the absence of gated images. No ischemia is seen.. -- EF 60% % CAD-MI-CABG -- Repeat echo for this admission -- I/O's and daily weights. -- plan is for left heart cath with grafts and poss PCI. Will initiate hydration at 8 pm 100cc's hour. Will hold today's coumadin. STAT PT/INR in am Patient  Put on schedule for cath tomorrow evening and orders placed.  2. Paroxysmal atrial fibrillation: On chronic coumadin.  3. S/P AAA: Aneurysmal disease of the native aorta superior to the endograft does show slow progression since 2010 with enlargement of the juxtarenal and proximal abdominal aorta up to 4.1 cm in maximum diameter. No evidence of abdominal aortic dissection.    Suan Halter, PA-C  09/06/2016 2:20 PM

## 2016-09-06 NOTE — Progress Notes (Signed)
ANTICOAGULATION CONSULT NOTE - Initial Consult  Pharmacy Consult for heparin Indication: chest pain/ACS  Allergies  Allergen Reactions  . Diltiazem Hcl Itching    Patient Measurements:   Heparin Dosing Weight: 94.6kg  Vital Signs: Temp: 97.7 F (36.5 C) (06/06 0449) Temp Source: Oral (06/06 0449) BP: 141/76 (06/06 1115) Pulse Rate: 53 (06/06 1145)  Labs:  Recent Labs  09/06/16 0455 09/06/16 0516 09/06/16 1138  HGB 13.4 13.6  --   HCT 40.3 40.0  --   PLT 138*  --   --   CREATININE 1.57* 1.60*  --   TROPONINI  --   --  0.85*    Estimated Creatinine Clearance: 50 mL/min (A) (by C-G formula based on SCr of 1.6 mg/dL (H)).   Medical History: Past Medical History:  Diagnosis Date  . AAA (abdominal aortic aneurysm) ([place])    a. 2010 s/p stenting  . Arthritis   . Bradycardia   . CAD (coronary artery disease) CABG in 2004   a. 2004 s/p CABG  . Carotid artery disease ([place])    a. 2012 dopplers with old LICA occlusion , RICA no significant abnormality  . CVA (cerebral infarction)    a. 2013 R Carona radiata stroke   . GERD (gastroesophageal reflux disease)   . Hyperlipidemia   . Hypertension   . Paroxysmal atrial fibrillation ([place]) 02/22/2009   a. on Coumadin   . Prediabetes   . Renal insufficiency   . Seborrheic dermatitis of scalp   . Tobacco abuse     Medications:  Infusions:  . heparin      Assessment: 61 yom presented to the ED with CP. Found to have an elevated troponin and now starting IV heparin. Pt is on warfarin PTA but INR is subtherapeutic at 1.73. Baseline H/H is WNL but platelets are slightly low. No bleeding noted.   Goal of Therapy:  Heparin level 0.3-0.7 units/ml Monitor platelets by anticoagulation protocol: Yes   Plan:  Heparin gtt 1200 units/hr - no bolus for now due to elevated INR Check an 8 hr heparin level Daily heparin level and CBC  Moo Gravley, Rande Lawman 09/06/2016,12:38 PM

## 2016-09-06 NOTE — ED Notes (Signed)
Patient transported to CT 

## 2016-09-06 NOTE — ED Notes (Signed)
Fialed IV ultrasound attempt to the RAC x2. 1st IV infiltrated with CT. 2nd IV resistance met when trying to flush. Positive blood return originally noted. IV removed.

## 2016-09-06 NOTE — ED Notes (Signed)
Dr. Posey Pronto, admitting MD, at bedside

## 2016-09-06 NOTE — H&P (Signed)
Date: 09/06/2016               Patient Name:  Dylan Morgan MRN: [id number]  DOB: 07/12/1945 Age / Sex: 71 y.o., male   PCP: Aldine Contes, MD         Medical Service: Internal Medicine Teaching Service         Attending Physician: Dr. Lucious Groves, DO    First Contact: Dr. Hetty Ely Pager: [phone]  Second Contact: Dr. Juleen China Pager: [phone]       After Hours (After 5p/  First Contact Pager: [phone]  weekends / holidays): Second Contact Pager: [phone]   Chief Complaint: chest pain  History of Present Illness: Dylan Morgan is a 71 year old man with h/o CABG, allergic rhinitis, thoracic aortic aneurysm, s/p AAA repair, paroxysmal atrial fibrillation on chronic anticoagulation, h/o CVA who presented to the ED this morning with sudden onset left-sided chest pain. He was able to put on his clothes but could not make it down the steps, so he called his son to call EMS to bring him to the hospital. His pain was initially associated with difficulty breathing and radiated down his left arm though resolved and did not vary with inspiration or palpation. His pain improved to 2-3/10 upon arrival to the ED with sublingual nitroglycerin and aspirin. He denies having similar symptoms in the past though had a CABG in 2004 and takes warfarin for paroxysmal atrial fibrillation. He denies having a prior CVA though most recent brain MRI 07/02/16 noted prior right cerebellar infarct, severe chronic small vessel ischemic disease, and chronic left internal carotid artery occlusion. He acknowledges taking warfarin and simvastatin daily. He otherwise denies diaphoresis, heartburn, leg swelling, poor appetite, nausea, vomiting, fevers.    In his family, he cannot recall anyone having a heart attack though his brother is on warfarin for reasons unknown to him. 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 After 32 years of smoking up to 3 packs/day,  he quit in 2011 and denies any ongoing alcohol or illicit drug use.   In the ED, initial troponin was negative. CTA noted 4.6 cm aneurysm of the ascending thoracic aorta, significant right subclavian artery stenosis stable from 1 month ago. After my evaluation, his troponin resulted 0.85, so he was started on heparin and Cardiology was consulted for further management.   Meds:  Current Meds  Medication Sig  . albuterol (PROVENTIL HFA;VENTOLIN HFA) 108 (90 Base) MCG/ACT inhaler Inhale 2 puffs into the lungs every 6 (six) hours as needed for wheezing or shortness of breath.  . gabapentin (NEURONTIN) 100 MG capsule Take 1 capsule (100 mg total) by mouth daily.  Marland Kitchen lisinopril (PRINIVIL,ZESTRIL) 5 MG tablet Take 1 tablet (5 mg total) by mouth daily.  . Multiple Vitamin (MULTIVITAMIN) tablet Take 1 tablet by mouth daily.  . simvastatin (ZOCOR) 40 MG tablet TAKE ONE TABLET BY MOUTH ONCE DAILY AFTER  SUPPER (Patient taking differently: Take 40 mg by mouth daily after supper. )  . vitamin B-12 (CYANOCOBALAMIN) 1000 MCG tablet Take 1 tablet (1,000 mcg total) by mouth daily. (Patient taking differently: Take 1,000 mcg by mouth daily after supper. )  . warfarin (COUMADIN) 3 MG tablet Take 1-2 tablets (3-6 mg total) by mouth See admin instructions. 4.5 mg every day except on Tuesday patient takes 3 mg.     Allergies: Allergies as of 09/06/2016 - Review  Complete 09/06/2016  Allergen Reaction Noted  . Diltiazem hcl Itching    Past Medical History:  Diagnosis Date  . AAA (abdominal aortic aneurysm) ([place])    a. 2010 s/p stenting  . Arthritis   . Bradycardia   . CAD (coronary artery disease) CABG in 2004   a. 2004 s/p CABG  . Carotid artery disease ([place])    a. 2012 dopplers with old LICA occlusion , RICA no significant abnormality  . CVA (cerebral infarction)    a. 2013 R Carona radiata stroke   . GERD (gastroesophageal reflux disease)   . Hyperlipidemia   . Hypertension   . Paroxysmal atrial  fibrillation ([place]) 02/22/2009   a. on Coumadin   . Prediabetes   . Renal insufficiency   . Seborrheic dermatitis of scalp   . Tobacco abuse     Family History: As noted in the HPI.  Social History: As noted in the HPI.  Review of Systems: A complete ROS was negative except as per HPI.   Physical Exam: Blood pressure (!) 168/78, pulse (!) 46, temperature 97.7 F (36.5 C), temperature source Oral, resp. rate 15, height 5\' 9"  (1.753 m), weight 240 lb 4.8 oz (109 kg), SpO2 100 %. Physical Exam  Constitutional: He is oriented to person, place, and time. No distress.  HENT:  Head: Normocephalic and atraumatic.  Eyes: Conjunctivae are normal. No scleral icterus.  Limited vision of the left eye.  Cardiovascular: Normal heart sounds.   Irregular pulse  Pulmonary/Chest: Effort normal. No respiratory distress.  Neurological: He is alert and oriented to person, place, and time.  Skin: He is not diaphoretic.   EKG: I reviewed and compared with 07/08/16. Sinus bradycardia, borderline left axis deviation  CXR: I reviewed and compared with 07/08/16. No consolidation or edema noted. Sternotomy wires in place.  Assessment & Plan by Problem: Principal Problem:   Chest pain Active Problems:   Hypertension   Hx of CABG   Long term (current) use of anticoagulants   S/P AAA (abdominal aortic aneurysm) repair  Dylan Morgan is a 71 year old man with h/o CABG, allergic rhinitis, thoracic aortic aneurysm, s/p AAA repair, paroxysmal atrial fibrillation on chronic anticoagulation, h/o CVA hospitalized for cardiac chest pain.  Chest pain: HEART score 9 which is high risk for ACS given his presentation, risk factors for ischemic disease, and initial troponin peak though no new EKG findings. No PE or dissection noted on CTA which is reassuring.  -Monitor on telemetry -Check troponins x 3 -Repeat EKG in AM -Continue nitroglycerin sublingual prn for chest pain -Start heparin IV -Recheck A1c  Paroxysmal  atrial fibrillation: Holding warfarin in setting of IV heparin.  Hypertension: Continue lisinopril 5 mg.  Allergic rhinitis: Continue loratidine 10 mg and fluticasone nasal spray.  COPD: Continue Duonebs every 6 hours as needed.  #FEN:  -Diet: NPO pending Cardiology recommendations  #DVT prophylaxis: heparin  #CODE STATUS: DNR/DNI -Defer to daughter Olegario Shearer [[phone]] if patients lacks decision-making capacity -Confirmed with patient on admission  Dispo: Admit patient to Observation with expected length of stay less than 2 midnights.  Signed: Riccardo Dubin, MD 09/06/2016, 1:40 PM  Pager: [phone]

## 2016-09-06 NOTE — ED Triage Notes (Signed)
Pt comes via [facility] for L sided CP , radiation to L arm, pain woke him up from his sleep. Some nausea, no vomiting. PTA received one nitro and 324 ASA, pt coughed and some ASA came back up. Pt also having abd pain, central. Pt has hx of bypass

## 2016-09-07 ENCOUNTER — Ambulatory Visit (HOSPITAL_COMMUNITY): Admit: 2016-09-07 | Payer: Medicare HMO | Admitting: Cardiovascular Disease

## 2016-09-07 DIAGNOSIS — R001 Bradycardia, unspecified: Secondary | ICD-10-CM | POA: Diagnosis present

## 2016-09-07 DIAGNOSIS — Z6833 Body mass index (BMI) 33.0-33.9, adult: Secondary | ICD-10-CM | POA: Diagnosis not present

## 2016-09-07 DIAGNOSIS — J309 Allergic rhinitis, unspecified: Secondary | ICD-10-CM

## 2016-09-07 DIAGNOSIS — J449 Chronic obstructive pulmonary disease, unspecified: Secondary | ICD-10-CM | POA: Diagnosis not present

## 2016-09-07 DIAGNOSIS — I129 Hypertensive chronic kidney disease with stage 1 through stage 4 chronic kidney disease, or unspecified chronic kidney disease: Secondary | ICD-10-CM | POA: Diagnosis not present

## 2016-09-07 DIAGNOSIS — Z9889 Other specified postprocedural states: Secondary | ICD-10-CM | POA: Diagnosis not present

## 2016-09-07 DIAGNOSIS — I200 Unstable angina: Secondary | ICD-10-CM | POA: Diagnosis not present

## 2016-09-07 DIAGNOSIS — I25810 Atherosclerosis of coronary artery bypass graft(s) without angina pectoris: Secondary | ICD-10-CM | POA: Diagnosis not present

## 2016-09-07 DIAGNOSIS — K219 Gastro-esophageal reflux disease without esophagitis: Secondary | ICD-10-CM | POA: Diagnosis present

## 2016-09-07 DIAGNOSIS — Y832 Surgical operation with anastomosis, bypass or graft as the cause of abnormal reaction of the patient, or of later complication, without mention of misadventure at the time of the procedure: Secondary | ICD-10-CM | POA: Diagnosis present

## 2016-09-07 DIAGNOSIS — G629 Polyneuropathy, unspecified: Secondary | ICD-10-CM | POA: Diagnosis not present

## 2016-09-07 DIAGNOSIS — T82858A Stenosis of vascular prosthetic devices, implants and grafts, initial encounter: Secondary | ICD-10-CM | POA: Diagnosis not present

## 2016-09-07 DIAGNOSIS — I214 Non-ST elevation (NSTEMI) myocardial infarction: Secondary | ICD-10-CM | POA: Diagnosis not present

## 2016-09-07 DIAGNOSIS — Z8679 Personal history of other diseases of the circulatory system: Secondary | ICD-10-CM

## 2016-09-07 DIAGNOSIS — Z8673 Personal history of transient ischemic attack (TIA), and cerebral infarction without residual deficits: Secondary | ICD-10-CM | POA: Diagnosis not present

## 2016-09-07 DIAGNOSIS — Z7951 Long term (current) use of inhaled steroids: Secondary | ICD-10-CM | POA: Diagnosis not present

## 2016-09-07 DIAGNOSIS — Z7901 Long term (current) use of anticoagulants: Secondary | ICD-10-CM | POA: Diagnosis not present

## 2016-09-07 DIAGNOSIS — I6522 Occlusion and stenosis of left carotid artery: Secondary | ICD-10-CM

## 2016-09-07 DIAGNOSIS — Z8249 Family history of ischemic heart disease and other diseases of the circulatory system: Secondary | ICD-10-CM | POA: Diagnosis not present

## 2016-09-07 DIAGNOSIS — G4733 Obstructive sleep apnea (adult) (pediatric): Secondary | ICD-10-CM | POA: Diagnosis not present

## 2016-09-07 DIAGNOSIS — R079 Chest pain, unspecified: Secondary | ICD-10-CM | POA: Insufficient documentation

## 2016-09-07 DIAGNOSIS — R7303 Prediabetes: Secondary | ICD-10-CM | POA: Diagnosis not present

## 2016-09-07 DIAGNOSIS — G4730 Sleep apnea, unspecified: Secondary | ICD-10-CM | POA: Diagnosis not present

## 2016-09-07 DIAGNOSIS — Z833 Family history of diabetes mellitus: Secondary | ICD-10-CM | POA: Diagnosis not present

## 2016-09-07 DIAGNOSIS — Z888 Allergy status to other drugs, medicaments and biological substances status: Secondary | ICD-10-CM | POA: Diagnosis not present

## 2016-09-07 DIAGNOSIS — Z951 Presence of aortocoronary bypass graft: Secondary | ICD-10-CM | POA: Diagnosis not present

## 2016-09-07 DIAGNOSIS — I2510 Atherosclerotic heart disease of native coronary artery without angina pectoris: Secondary | ICD-10-CM | POA: Diagnosis not present

## 2016-09-07 DIAGNOSIS — E785 Hyperlipidemia, unspecified: Secondary | ICD-10-CM | POA: Diagnosis present

## 2016-09-07 DIAGNOSIS — I10 Essential (primary) hypertension: Secondary | ICD-10-CM

## 2016-09-07 DIAGNOSIS — I708 Atherosclerosis of other arteries: Secondary | ICD-10-CM | POA: Diagnosis present

## 2016-09-07 DIAGNOSIS — I6782 Cerebral ischemia: Secondary | ICD-10-CM

## 2016-09-07 DIAGNOSIS — I480 Paroxysmal atrial fibrillation: Secondary | ICD-10-CM | POA: Diagnosis not present

## 2016-09-07 DIAGNOSIS — Z9989 Dependence on other enabling machines and devices: Secondary | ICD-10-CM

## 2016-09-07 DIAGNOSIS — N183 Chronic kidney disease, stage 3 (moderate): Secondary | ICD-10-CM | POA: Diagnosis not present

## 2016-09-07 DIAGNOSIS — Z79899 Other long term (current) drug therapy: Secondary | ICD-10-CM | POA: Diagnosis not present

## 2016-09-07 DIAGNOSIS — R1013 Epigastric pain: Secondary | ICD-10-CM | POA: Diagnosis present

## 2016-09-07 DIAGNOSIS — I712 Thoracic aortic aneurysm, without rupture: Secondary | ICD-10-CM | POA: Diagnosis not present

## 2016-09-07 LAB — CBC
HEMATOCRIT: 34.3 % — AB (ref 39.0–52.0)
HEMOGLOBIN: 11.1 g/dL — AB (ref 13.0–17.0)
MCH: 29 pg (ref 26.0–34.0)
MCHC: 32.4 g/dL (ref 30.0–36.0)
MCV: 89.6 fL (ref 78.0–100.0)
Platelets: 107 10*3/uL — ABNORMAL LOW (ref 150–400)
RBC: 3.83 MIL/uL — AB (ref 4.22–5.81)
RDW: 14.1 % (ref 11.5–15.5)
WBC: 3.1 10*3/uL — AB (ref 4.0–10.5)

## 2016-09-07 LAB — PROTIME-INR
INR: 2.04
INR: 1.82
Prothrombin Time: 23.4 seconds — ABNORMAL HIGH (ref 11.4–15.2)
Prothrombin Time: 21.3 seconds — ABNORMAL HIGH (ref 11.4–15.2)

## 2016-09-07 LAB — BASIC METABOLIC PANEL
ANION GAP: 9 (ref 5–15)
BUN: 17 mg/dL (ref 6–20)
CO2: 24 mmol/L (ref 22–32)
Calcium: 8.8 mg/dL — ABNORMAL LOW (ref 8.9–10.3)
Chloride: 105 mmol/L (ref 101–111)
Creatinine, Ser: 1.53 mg/dL — ABNORMAL HIGH (ref 0.61–1.24)
GFR calc Af Amer: 50 mL/min — ABNORMAL LOW (ref >60)
GFR, EST NON AFRICAN AMERICAN: 43 mL/min — AB (ref >60)
GLUCOSE: 116 mg/dL — AB (ref 65–99)
POTASSIUM: 3.7 mmol/L (ref 3.5–5.1)
Sodium: 138 mmol/L (ref 135–145)

## 2016-09-07 LAB — GLUCOSE, CAPILLARY
GLUCOSE-CAPILLARY: 112 mg/dL — AB (ref 65–99)
GLUCOSE-CAPILLARY: 136 mg/dL — AB (ref 65–99)
GLUCOSE-CAPILLARY: 123 mg/dL — AB (ref 65–99)
Glucose-Capillary: 168 mg/dL — ABNORMAL HIGH (ref 65–99)

## 2016-09-07 LAB — MRSA PCR SCREENING: MRSA by PCR: NEGATIVE

## 2016-09-07 LAB — HEPARIN LEVEL (UNFRACTIONATED): Heparin Unfractionated: 0.53 IU/mL (ref 0.30–0.70)

## 2016-09-07 MED ORDER — MELATONIN 3 MG PO TABS
3.0000 mg | ORAL_TABLET | Freq: Every day | ORAL | Status: DC
  Administered 2016-09-07: 3 mg via ORAL
  Filled 2016-09-07 (×2): qty 1

## 2016-09-07 MED ORDER — SODIUM CHLORIDE 0.9 % IV SOLN
INTRAVENOUS | Status: DC
  Administered 2016-09-07 – 2016-09-09 (×4): via INTRAVENOUS

## 2016-09-07 NOTE — Progress Notes (Signed)
This note also relates to the following rows which could not be included: MAP (mmHg) - Cannot attach notes to unvalidated device data

## 2016-09-07 NOTE — Progress Notes (Addendum)
   Subjective: Dylan Morgan denies chest pain or difficulty breathing today. He is hungry and anxiously awaiting his cath procedure.   Objective:  Vital signs in last 24 hours: Vitals:   09/07/16 0010 09/07/16 0505 09/07/16 0737 09/07/16 1251  BP:  (!) 125/58 130/81 (!) 151/85  Pulse:  (!) 52 60 (!) 35  Resp:  11 15 16   Temp:  97.9 F (36.6 C)    TempSrc:  Oral    SpO2: 96% 96% 95% 96%  Weight:  240 lb (108.9 kg)    Height:       Physical Exam  Constitutional: He is oriented to person, place, and time and well-developed, well-nourished, and in no distress. No distress.  HENT:  Head: Normocephalic and atraumatic.  Mouth/Throat: No oropharyngeal exudate.  Eyes: Conjunctivae are normal. Right eye exhibits no discharge. Left eye exhibits no discharge. No scleral icterus.  Cardiovascular: Normal rate and regular rhythm.   No murmur heard. Trace lower extremity edema   Pulmonary/Chest: Effort normal and breath sounds normal. No respiratory distress. He has no wheezes. He has no rales.  Abdominal: Soft. Bowel sounds are normal. He exhibits distension. There is no tenderness. There is no guarding.  Neurological: He is alert and oriented to person, place, and time.  Skin: Skin is warm and dry. He is not diaphoretic.  Psychiatric: Affect and judgment normal.   Assessment/Plan:  Principal Problem:   Non-ST elevation MI (NSTEMI) ([place]) Active Problems:   CKD (chronic kidney disease), stage III   Sleep apnea   Hypertension   Paroxysmal atrial fibrillation (HCC)   Hx of CABG   Long term (current) use of anticoagulants   S/P AAA (abdominal aortic aneurysm) repair   Unstable angina (HCC)  NSTEMI, hx of CABG Troponin continued to trend up overnight, he was seen by cardiology who started a heparin drip and plan to take him to cath today. He has had no further chest pain since receiving nitroglycerine in the ED. Cardiology was awaiting his INR to drift down to 1.8, which it has.  -follow up  repeat echo  -continue home med simvastatin 40 mg qd -heparin gtt, aspirin  - holding beginning a beta blocker for bradycardia   CKD stage III  Crt improved 1.5 this morning with IVFs, baseline crt is around 1.3.  -follow daily BMP   Paroxysmal atrial fibrillation  He is in NSR on telemetry.  - hold home coumadin, on IV heparin for NSTEMI   S/p AAA  CTAngio in the ED revealed progression of the aneurysm since 2010 up to 4.6 cm in maximum diameter. Asymptomatic.   COPD  Continue home med duonebs q6h PRN.   Hypertension  BP is controlled, continue home med lisinopril 5 mg.   Obstructive sleep apnea  - ordered CPAP   Dispo: Anticipated discharge in approximately 1-2 day(s).   Ledell Noss, MD 09/07/2016, 1:33 PM Pager: [phone]

## 2016-09-07 NOTE — Progress Notes (Addendum)
The patient has been seen in conjunction with Reino Bellis, NP. All aspects of care have been considered and discussed. The patient has been personally interviewed, examined, and all clinical data has been reviewed.   Stable overnight. Rising troponin compatible with non-ST elevation myocardial infarction.  Repeat INR at 11 AM was 1.8. I feel he is probably safe to proceed with coronary angiography via radial approach but will leave to the discretion of colleague.  Overnight hydration has slightly improved creatinine down to 1.5.   Suspect bypass graft failure. Unable to find report from [place]. Based on historical records within our system and the patient, he had three-vessel bypass. He doesn't know if LIMA use occurred.   Progress Note  Patient Name: Dylan Morgan Date of Encounter: 09/07/2016  Primary Cardiologist: Irish Lack  Subjective   No further chest pain.   Inpatient Medications    Scheduled Meds: . aspirin  325 mg Oral Daily  . nitroGLYCERIN  1 inch Topical Q6H  . simvastatin  40 mg Oral QPC supper  . sodium chloride flush  3 mL Intravenous Q12H   Continuous Infusions: . sodium chloride    . sodium chloride 100 mL/hr (09/07/16 0735)  . heparin 1,500 Units/hr (09/06/16 2345)   PRN Meds: sodium chloride, acetaminophen **OR** acetaminophen, guaiFENesin-dextromethorphan, ipratropium-albuterol, sodium chloride flush   Vital Signs    Vitals:   09/06/16 2342 09/07/16 0010 09/07/16 0505 09/07/16 0737  BP: 116/64  (!) 125/58 130/81  Pulse: 64  (!) 52 60  Resp: 16  11 15   Temp: 98.3 F (36.8 C)  97.9 F (36.6 C)   TempSrc: Oral  Oral   SpO2: 96% 96% 96% 95%  Weight:   240 lb (108.9 kg)   Height:        Intake/Output Summary (Last 24 hours) at 09/07/16 0816 Last data filed at 09/07/16 0509  Gross per 24 hour  Intake            444.8 ml  Output              600 ml  Net           -155.2 ml   Filed Weights   09/06/16 1216 09/06/16 1449 09/07/16 0505    Weight: 240 lb 4.8 oz (109 kg) 237 lb 14.4 oz (107.9 kg) 240 lb (108.9 kg)    Telemetry    SR, PACs - Personally Reviewed  ECG    N/A - Personally Reviewed  Physical Exam   General: Older obese W male appearing in no acute distress. Head: Normocephalic, atraumatic.  Neck: Supple without bruits, JVD. Lungs:  Resp regular and unlabored, CTA. Heart: RRR, S1, S2, no S3, S4, or murmur; no rub. Abdomen: Soft, non-tender, non-distended with normoactive bowel sounds. No hepatomegaly. No rebound/guarding. No obvious abdominal masses. Extremities: No clubbing, cyanosis, trace LE edema. Distal pedal pulses are 2+ bilaterally. Neuro: Alert and oriented X 3. Moves all extremities spontaneously. Psych: Normal affect.  Labs    Chemistry Recent Labs Lab 09/06/16 0455 09/06/16 0516 09/07/16 0451  NA 140 142 138  K 3.9 3.7 3.7  CL 105 105 105  CO2 24  --  24  GLUCOSE 125* 123* 116*  BUN 19 24* 17  CREATININE 1.57* 1.60* 1.53*  CALCIUM 9.2  --  8.8*  PROT 6.9  --   --   ALBUMIN 4.0  --   --   AST 33  --   --   ALT 17  --   --  ALKPHOS 73  --   --   BILITOT 0.9  --   --   GFRNONAA 42*  --  43*  GFRAA 49*  --  50*  ANIONGAP 11  --  9     Hematology Recent Labs Lab 09/06/16 0455 09/06/16 0516 09/07/16 0451  WBC 4.0  --  3.1*  RBC 4.54  --  3.83*  HGB 13.4 13.6 11.1*  HCT 40.3 40.0 34.3*  MCV 88.8  --  89.6  MCH 29.5  --  29.0  MCHC 33.3  --  32.4  RDW 14.1  --  14.1  PLT 138*  --  PENDING    Cardiac Enzymes Recent Labs Lab 09/06/16 1138 09/06/16 1931 09/06/16 2258  TROPONINI 0.85* 2.91* 2.89*    Recent Labs Lab 09/06/16 0500  TROPIPOC 0.00     BNPNo results for input(s): BNP, PROBNP in the last 168 hours.   DDimer No results for input(s): DDIMER in the last 168 hours.    Radiology    Dg Chest 2 View  Result Date: 09/06/2016 CLINICAL DATA:  Chest pain EXAM: CHEST  2 VIEW COMPARISON:  07/08/2016 FINDINGS: Cardiomegaly. Prior CABG. Lungs are clear.  No effusions or acute bony abnormality. IMPRESSION: Cardiomegaly.  No active disease. Electronically Signed   By: Rolm Baptise M.D.   On: 09/06/2016 09:27   Ct Angio Chest/abd/pel For Dissection W And/or Wo Contrast  Result Date: 09/06/2016 CLINICAL DATA:  Chest and abdominal pain. History of prior EVAR in 2010 to treat abdominal aortic aneurysm EXAM: CT ANGIOGRAPHY CHEST, ABDOMEN AND PELVIS TECHNIQUE: Multidetector CT imaging through the chest, abdomen and pelvis was performed using the standard protocol during bolus administration of intravenous contrast. Multiplanar reconstructed images and MIPs were obtained and reviewed to evaluate the vascular anatomy. CONTRAST:  80 mL Isovue 370 IV COMPARISON:  Prior CTA of the abdomen and pelvis on 08/10/2016 as well as multiple prior imaging studies post EVAR. Prior CT of the chest without contrast on 12/15/2015. FINDINGS: CTA CHEST FINDINGS Cardiovascular: The thoracic aorta is well opacified and shows no evidence of acute dissection. There is aneurysmal disease of the ascending thoracic aorta which measures 4.6 cm in greatest diameter. The aorta measures approximately 4.2 cm at the sinuses of Valsalva. Aortic arch and descending thoracic aorta are normal in caliber. Calcified and noncalcified plaque at the origin of the right subclavian artery causes significant stenosis of approximately 70- 75% narrowing with poststenotic dilatation present. Mild plaque is present in the proximal innominate artery, proximal left common carotid artery and proximal left subclavian artery without significant stenosis. Pulmonary arterial opacification is suboptimal at the timing of the study. There is evidence of prior CABG. The heart size is stable. No pericardial fluid. Mediastinum/Nodes: No enlarged mediastinal, hilar, or axillary lymph nodes. Thyroid gland, trachea, and esophagus demonstrate no significant findings. Lungs/Pleura: There is no evidence of pulmonary edema, consolidation,  pneumothorax, nodule or pleural fluid. 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 Review of the MIP images confirms the above findings. CTA ABDOMEN AND PELVIS FINDINGS VASCULAR Aorta: Stable positioning of aortic endograft since the prior study. There remains evidence of slight distal migration of the proximal end of the endograft, especially along its left infrarenal margin. There is no associated type 1 endoleak. Aneurysm sac is stable measuring 4.7 x 5.3 cm and demonstrating no evidence of type 2 endoleak. Distal common iliac limbs show stable positioning and patency. Since aneurysm repair, there is some gradual increase caliber of  the proximal and mid abdominal aorta superior to the endograft. The juxtarenal aorta measures 4.1 cm in maximum diameter and measured 3.5 cm at a comparable level prior to aneurysm repair. The aorta measures 4 cm at the level of the SMA origin. Celiac: Stable moderate stenosis, 50- 60%. SMA: Stable stenosis, 40- 50%. Renals: Stable patency of single right and 2 separate left renal arteries without significant stenosis. IMA: Occluded origin.  Distal branches reconstituted. Inflow: Native iliac arteries show stable patency. There is stable mild aneurysmal dilatation of the proximal right internal iliac artery trunk measuring 13 mm. Common femoral arteries and femoral bifurcations show normal patency. Review of the MIP images confirms the above findings. NON-VASCULAR Hepatobiliary: No focal liver abnormality is seen. No gallstones, gallbladder wall thickening, or biliary dilatation. Pancreas: Unremarkable. No pancreatic ductal dilatation or surrounding inflammatory changes. Spleen: Normal in size without focal abnormality. Adrenals/Urinary Tract: Adrenal glands are unremarkable. Kidneys are normal, without renal calculi, focal lesion, or hydronephrosis. Bladder is unremarkable. Stomach/Bowel: No abnormality identified involving bowel. No free air. No  abnormal fluid collections. Lymphatic: No enlarged lymph nodes identified. Reproductive: Prostate is unremarkable. Other: No abdominal wall hernia or abnormality. No abdominopelvic ascites. Musculoskeletal: Lumbar degenerative disc disease present with associated mild leftward convex scoliosis. Review of the MIP images confirms the above findings. IMPRESSION: 1. Aneurysmal disease of the ascending thoracic aorta measuring up to 4.6 cm in maximum diameter. No thoracic aortic dissection identified. Recommend semi-annual imaging followup by CTA or MRA and referral to cardiothoracic surgery if not already obtained. This recommendation follows 2010 ACCF/AHA/AATS/ACR/ASA/SCA/SCAI/SIR/STS/SVM Guidelines for the Diagnosis and Management of Patients With Thoracic Aortic Disease. Circulation. 2010; 121: e266-e36 2. Significant stenosis at the origin of the right subclavian artery approaching 70- 75% in estimated caliber. Recommend correlation with discrepancy in blood pressure measurement in the upper extremities and with any symptoms of right upper extremity claudication. 3. Stable CTA of the abdomen and pelvis since recent imaging last month. There remains evidence of slight distal migration of the proximal endograft since placement without evidence of endoleak. Aneurysmal disease of the native aorta superior to the endograft does show slow progression since 2010 with enlargement of the juxtarenal and proximal abdominal aorta up to 4.1 cm in maximum diameter. No evidence of abdominal aortic dissection. Electronically Signed   By: Aletta Edouard M.D.   On: 09/06/2016 10:09    Cardiac Studies   N/a   Patient Profile     71 y.o. male hx of AAA (s/p stenting in 2010), arthritis, bradycardia, CAD (s/p CABG 3v (grafts unknown, 2004), CVA, GERD, hyperlipidemia, hypertension, paroxysmal atrial fibrillation 2010, renal insufficiency, tobacco abuse,and B12 deficiency who presented with chest pain, and + troponin.    Assessment & Plan    1. NSTEMI: Presented with chest pain that was similar to previous cardiac events. Troponin peaked at 2.9, now trending down. No further chest pain.  -- on IV heparin -- planned for cardiac cath today, but INR still >2 this morning. Plan to recheck this afternoon around 2pm.   2. CKD: Cr improved to 1.5 this morning with IVFs.  -- daily BMET  3. PAF: SR on tele -- Coumadin held--> IV heparin in anticipation for cath.  4. AAA: 4.6cm on CTA on admission from 4.1cm in 2010, no dissection noted.   Signed, Reino Bellis, NP  09/07/2016, 8:16 AM

## 2016-09-07 NOTE — Progress Notes (Signed)
This note also relates to the following rows which could not be included: Resp - Cannot attach notes to unvalidated device data  Pt decline the use of cpap this evening.

## 2016-09-07 NOTE — Care Management Obs Status (Signed)
[facility] NOTIFICATION   Patient Details  Name: DEMONDRE AGUAS MRN: [id number] Date of Birth: 1945-05-15   Medicare Observation Status Notification Given:  Yes    Bethena Roys, RN 09/07/2016, 3:52 PM

## 2016-09-07 NOTE — H&P (Signed)
Internal Medicine Attending Admission Note  I saw and evaluated the patient. I reviewed the resident's note and I agree with the resident's findings and plan as documented in the resident's note.  Assessment & Plan by Problem:   Non-ST elevation MI (NSTEMI) (HCC) - Heparin drip, ASA -No BB at this time due to bradycardia. -Continue statin -To go for Cath, awaiting repeat INR    CKD (chronic kidney disease), stage III - Trend, get precath fluids.    Sleep apnea - CPAP    Hypertension -Monitor, may need to restart home medication    Paroxysmal atrial fibrillation (HCC) - Currently bradycardic    Hx of CABG   Long term (current) use of anticoagulants - Hold warfarin for cath, currently on Heparin   S/P AAA (abdominal aortic aneurysm) repair - Stable   Chief Complaint(s):chest pain  History - key components related to admission:Briefly Dylan Morgan is 71 year old man with history of coronary artery disease status post CABG, thoracic aortic aneurysm, abdominal aortic aneurysm status post repair, A. fib, CVA who presented yesterday to the ED with sudden onset left-sided chest pain. Pain is associated with shortness of breath, radiating to his left arm. Pain was improved with sublingual nitroglycerin and aspirin. In the emergency department initial EKG was grossly unchanged, and initial troponin was negative, a CTA was obtained to evaluate his aneurysm and it was unchanged from previous. However subsequent troponin was elevated to 0.85. Cardiology was consulted who recommended starting heparin and internal medicine admission. Currently patient reports he feels much better his chest pain has been resolved since admission, he has no specific complaints.  Lab results: Reviewed in Epic  Physical Exam - key components related to admission: General: resting in bed, no distress, pleasant HEENT: EOMI, no scleral icterus Cardiac: RRR, no appreciated rubs, murmurs or gallops Pulm: clear to  auscultation bilaterally Abd: soft, nontender, nondistended, BS present Ext: warm and well perfused, no pedal edema   Vitals:   09/07/16 0505 09/07/16 0737 09/07/16 1251 09/07/16 1410  BP: (!) 125/58 130/81 (!) 151/85 (!) 168/87  Pulse: (!) 52 60 (!) 35 64  Resp: 11 15 16 18   Temp: 97.9 F (36.6 C)   98.5 F (36.9 C)  TempSrc: Oral   Oral  SpO2: 96% 95% 96% 93%  Weight: 240 lb (108.9 kg)     Height:

## 2016-09-07 NOTE — Progress Notes (Signed)
ANTICOAGULATION CONSULT NOTE - Follow Up Consult  Pharmacy Consult for Heparin Indication: chest pain/ACS and atrial fibrillation  Allergies  Allergen Reactions  . Diltiazem Hcl Itching    Patient Measurements: Height: 5\' 10"  (177.8 cm) Weight: 240 lb (108.9 kg) IBW/kg (Calculated) : 73 Heparin Dosing Weight: 97 kg  Vital Signs: Temp: 97.9 F (36.6 C) (06/07 0505) Temp Source: Oral (06/07 0505) BP: 130/81 (06/07 0737) Pulse Rate: 60 (06/07 0737)  Labs:  Recent Labs  09/06/16 0455 09/06/16 0516 09/06/16 1138 09/06/16 1321 09/06/16 1931 09/06/16 2258 09/07/16 0451 09/07/16 0734  HGB 13.4 13.6  --   --   --   --  11.1*  --   HCT 40.3 40.0  --   --   --   --  34.3*  --   PLT 138*  --   --   --   --   --  107*  --   LABPROT  --   --   --  20.5*  --   --  23.4*  --   INR  --   --   --  1.73  --   --  2.04  --   HEPARINUNFRC  --   --   --   --   --  0.19*  --  0.53  CREATININE 1.57* 1.60*  --   --   --   --  1.53*  --   TROPONINI  --   --  0.85*  --  2.91* 2.89*  --   --     Estimated Creatinine Clearance: 53.2 mL/min (A) (by C-G formula based on SCr of 1.53 mg/dL (H)).  Assessment:   71 yr old male with NSTEM, hoping for cardiac cath later today if INR down.    On Coumadin prior to admission for PAF.  INR 1.73 yesterday, Coumadin held, but INR up to 2.04 today.  Last Coumadin dose 6/4 at home.  For repeat INR at 2pm today.        IV heparin begun last night. Initial heparin level low (0.19) on 1200 units/hr, but now therapeutic (0.53) on 1500 units/hr.   CBC has trended down from 6/6, platelet count 138>107.  147-133 in April.  No bleeding reported. Has AAA, followed by [facility]. No dissection on CTA 09/06/16.     Home Coumadin regimen:  4.5 mg daily except 3 mg on Tuesdays  Goal of Therapy:  Heparin level 0.3-0.7 units/ml Monitor platelets by anticoagulation protocol: Yes   Plan:   Continue heparin drip at 1500 units/hr  Daily heparin level and CBC while on heparin.    Coumadin on hold; daily PT/INR.  Follow up 2pm INR and possible cath later today.  Arty Baumgartner, [place] Pager: [phone] 09/07/2016,10:16 AM

## 2016-09-07 NOTE — Care Management Note (Signed)
Case Management Note  Patient Details  Name: Dylan Morgan MRN: [id number] Date of Birth: Apr 13, 1945  Subjective/Objective: Pt presented for Nstemi. Plan was for cath 09-07-16-INR 1.82. Cath rescheduled for 09-08-16. Pt is from home with son and daughter-n-law. Pt does not use any DME at home. Pt's plan to return home once stable.                  Action/Plan: No home needs identified at this time.   Expected Discharge Date:                  Expected Discharge Plan:  Home/Self Care  In-House Referral:  NA  Discharge planning Services  CM Consult  Post Acute Care Choice:  NA Choice offered to:  NA  DME Arranged:  N/A DME Agency:  NA  HH Arranged:  NA HH Agency:  NA  Status of Service:  Completed, signed off  If discussed at [place] of Stay Meetings, dates discussed:    Additional Comments:  Bethena Roys, RN 09/07/2016, 3:53 PM

## 2016-09-07 NOTE — Progress Notes (Signed)
    Unable to obtain cath records. INR still at 1.8, with planned groin approach. Will reschedule cath for tomorrow. Diet ordered. Continue IV heparin.   SignedReino Bellis, NP-C 09/07/2016, 2:55 PM Pager: [phone]

## 2016-09-08 ENCOUNTER — Encounter (HOSPITAL_COMMUNITY)
Admission: EM | Disposition: A | Discharge status: Home / Self Care | Payer: Self-pay | Source: Home / Self Care | Attending: Internal Medicine

## 2016-09-08 DIAGNOSIS — Z7951 Long term (current) use of inhaled steroids: Secondary | ICD-10-CM

## 2016-09-08 DIAGNOSIS — I2510 Atherosclerotic heart disease of native coronary artery without angina pectoris: Secondary | ICD-10-CM

## 2016-09-08 DIAGNOSIS — G4733 Obstructive sleep apnea (adult) (pediatric): Secondary | ICD-10-CM

## 2016-09-08 HISTORY — PX: LEFT HEART CATH AND CORS/GRAFTS ANGIOGRAPHY: CATH118250

## 2016-09-08 LAB — GLUCOSE, CAPILLARY
Glucose-Capillary: 117 mg/dL — ABNORMAL HIGH (ref 65–99)
Glucose-Capillary: 116 mg/dL — ABNORMAL HIGH (ref 65–99)
Glucose-Capillary: 118 mg/dL — ABNORMAL HIGH (ref 65–99)
Glucose-Capillary: 109 mg/dL — ABNORMAL HIGH (ref 65–99)

## 2016-09-08 LAB — BASIC METABOLIC PANEL
ANION GAP: 9 (ref 5–15)
BUN: 16 mg/dL (ref 6–20)
CO2: 25 mmol/L (ref 22–32)
Calcium: 9 mg/dL (ref 8.9–10.3)
Chloride: 104 mmol/L (ref 101–111)
Creatinine, Ser: 1.39 mg/dL — ABNORMAL HIGH (ref 0.61–1.24)
GFR calc Af Amer: 56 mL/min — ABNORMAL LOW (ref >60)
GFR, EST NON AFRICAN AMERICAN: 49 mL/min — AB (ref >60)
GLUCOSE: 116 mg/dL — AB (ref 65–99)
POTASSIUM: 4.2 mmol/L (ref 3.5–5.1)
Sodium: 138 mmol/L (ref 135–145)

## 2016-09-08 LAB — CBC
HCT: 36.7 % — ABNORMAL LOW (ref 39.0–52.0)
HEMOGLOBIN: 12.1 g/dL — AB (ref 13.0–17.0)
MCH: 29.4 pg (ref 26.0–34.0)
MCHC: 33 g/dL (ref 30.0–36.0)
MCV: 89.3 fL (ref 78.0–100.0)
Platelets: 120 10*3/uL — ABNORMAL LOW (ref 150–400)
RBC: 4.11 MIL/uL — AB (ref 4.22–5.81)
RDW: 14.1 % (ref 11.5–15.5)
WBC: 4.9 10*3/uL (ref 4.0–10.5)

## 2016-09-08 LAB — PROTIME-INR
INR: 1.49
Prothrombin Time: 18.2 seconds — ABNORMAL HIGH (ref 11.4–15.2)

## 2016-09-08 LAB — POCT ACTIVATED CLOTTING TIME: Activated Clotting Time: 164 seconds

## 2016-09-08 LAB — HEPARIN LEVEL (UNFRACTIONATED): Heparin Unfractionated: 0.6 IU/mL (ref 0.30–0.70)

## 2016-09-08 SURGERY — LEFT HEART CATH AND CORS/GRAFTS ANGIOGRAPHY
Anesthesia: LOCAL

## 2016-09-08 MED ORDER — WARFARIN - PHARMACIST DOSING INPATIENT: Freq: Every day | Status: DC

## 2016-09-08 MED ORDER — ATORVASTATIN CALCIUM 80 MG PO TABS
80.0000 mg | ORAL_TABLET | Freq: Every day | ORAL | Status: DC
  Administered 2016-09-08: 80 mg via ORAL
  Filled 2016-09-08: qty 1

## 2016-09-08 MED ORDER — MIDAZOLAM HCL 2 MG/2ML IJ SOLN
INTRAMUSCULAR | Status: AC
  Filled 2016-09-08: qty 2

## 2016-09-08 MED ORDER — IOPAMIDOL (ISOVUE-370) INJECTION 76%
INTRAVENOUS | Status: DC | PRN
  Administered 2016-09-08: 160 mL via INTRA_ARTERIAL

## 2016-09-08 MED ORDER — ASPIRIN 81 MG PO CHEW
81.0000 mg | CHEWABLE_TABLET | ORAL | Status: AC
  Administered 2016-09-08: 81 mg via ORAL
  Filled 2016-09-08: qty 1

## 2016-09-08 MED ORDER — HYDRALAZINE HCL 20 MG/ML IJ SOLN: 5.0000 mg | Freq: Once | INTRAMUSCULAR | Status: DC

## 2016-09-08 MED ORDER — CLOPIDOGREL BISULFATE 75 MG PO TABS
75.0000 mg | ORAL_TABLET | Freq: Every day | ORAL | Status: DC
  Administered 2016-09-09: 75 mg via ORAL
  Filled 2016-09-08 (×3): qty 1

## 2016-09-08 MED ORDER — ASPIRIN 81 MG PO CHEW: 81.0000 mg | CHEWABLE_TABLET | Freq: Every day | ORAL | Status: DC

## 2016-09-08 MED ORDER — HEPARIN (PORCINE) IN NACL 2-0.9 UNIT/ML-% IJ SOLN
INTRAMUSCULAR | Status: AC | PRN
  Administered 2016-09-08: 1500 mL

## 2016-09-08 MED ORDER — ONDANSETRON HCL 4 MG/2ML IJ SOLN: 4.0000 mg | Freq: Four times a day (QID) | INTRAMUSCULAR | Status: DC | PRN

## 2016-09-08 MED ORDER — WARFARIN SODIUM 5 MG PO TABS
6.0000 mg | ORAL_TABLET | Freq: Once | ORAL | Status: AC
  Administered 2016-09-08: 6 mg via ORAL
  Filled 2016-09-08: qty 1

## 2016-09-08 MED ORDER — TIROFIBAN HCL IV 12.5 MG/250 ML
0.0750 ug/kg/min | INTRAVENOUS | Status: DC
  Administered 2016-09-08 (×2): 0.075 ug/kg/min via INTRAVENOUS
  Filled 2016-09-08: qty 100
  Filled 2016-09-08: qty 250

## 2016-09-08 MED ORDER — HEPARIN SODIUM (PORCINE) 1000 UNIT/ML IJ SOLN
INTRAMUSCULAR | Status: AC
  Filled 2016-09-08: qty 1

## 2016-09-08 MED ORDER — FENTANYL CITRATE (PF) 100 MCG/2ML IJ SOLN
INTRAMUSCULAR | Status: DC | PRN
  Administered 2016-09-08: 25 ug via INTRAVENOUS

## 2016-09-08 MED ORDER — TIROFIBAN HCL IN NACL 5-0.9 MG/100ML-% IV SOLN
INTRAVENOUS | Status: AC | PRN
  Administered 2016-09-08: 0.075 ug/kg/min via INTRAVENOUS

## 2016-09-08 MED ORDER — LIDOCAINE HCL (PF) 1 % IJ SOLN
INTRAMUSCULAR | Status: DC | PRN
  Administered 2016-09-08: 12 mL

## 2016-09-08 MED ORDER — IOPAMIDOL (ISOVUE-370) INJECTION 76%
INTRAVENOUS | Status: AC
  Filled 2016-09-08: qty 100

## 2016-09-08 MED ORDER — SODIUM CHLORIDE 0.9 % WEIGHT BASED INFUSION: 1.0000 mL/kg/h | INTRAVENOUS | Status: AC

## 2016-09-08 MED ORDER — TIROFIBAN HCL IN NACL 5-0.9 MG/100ML-% IV SOLN
INTRAVENOUS | Status: AC
  Filled 2016-09-08: qty 100

## 2016-09-08 MED ORDER — TIROFIBAN (AGGRASTAT) BOLUS VIA INFUSION
INTRAVENOUS | Status: DC | PRN
  Administered 2016-09-08: 2627.5 ug via INTRAVENOUS

## 2016-09-08 MED ORDER — HEPARIN SODIUM (PORCINE) 1000 UNIT/ML IJ SOLN
INTRAMUSCULAR | Status: DC | PRN
  Administered 2016-09-08: 2000 [IU] via INTRAVENOUS

## 2016-09-08 MED ORDER — SODIUM CHLORIDE 0.9% FLUSH: 3.0000 mL | INTRAVENOUS | Status: DC | PRN

## 2016-09-08 MED ORDER — HYDRALAZINE HCL 20 MG/ML IJ SOLN
INTRAMUSCULAR | Status: AC
  Filled 2016-09-08: qty 1

## 2016-09-08 MED ORDER — CLOPIDOGREL BISULFATE 75 MG PO TABS
150.0000 mg | ORAL_TABLET | Freq: Once | ORAL | Status: AC
  Administered 2016-09-08: 150 mg via ORAL
  Filled 2016-09-08: qty 2

## 2016-09-08 MED ORDER — SODIUM CHLORIDE 0.9 % IV SOLN: 250.0000 mL | INTRAVENOUS | Status: DC | PRN

## 2016-09-08 MED ORDER — ACETAMINOPHEN 325 MG PO TABS: 650.0000 mg | ORAL_TABLET | ORAL | Status: DC | PRN

## 2016-09-08 MED ORDER — HEPARIN (PORCINE) IN NACL 2-0.9 UNIT/ML-% IJ SOLN
INTRAMUSCULAR | Status: AC
  Filled 2016-09-08: qty 1500

## 2016-09-08 MED ORDER — HYDRALAZINE HCL 20 MG/ML IJ SOLN
5.0000 mg | Freq: Once | INTRAMUSCULAR | Status: AC
  Administered 2016-09-08: 5 mg via INTRAVENOUS

## 2016-09-08 MED ORDER — IOPAMIDOL (ISOVUE-370) INJECTION 76%
INTRAVENOUS | Status: AC
  Filled 2016-09-08: qty 125

## 2016-09-08 MED ORDER — DIAZEPAM 5 MG PO TABS
5.0000 mg | ORAL_TABLET | Freq: Four times a day (QID) | ORAL | Status: DC | PRN
  Administered 2016-09-09: 5 mg via ORAL
  Filled 2016-09-08: qty 1

## 2016-09-08 MED ORDER — SODIUM CHLORIDE 0.9% FLUSH
3.0000 mL | Freq: Two times a day (BID) | INTRAVENOUS | Status: DC
  Administered 2016-09-08: 3 mL via INTRAVENOUS

## 2016-09-08 MED ORDER — LIDOCAINE HCL 1 % IJ SOLN
INTRAMUSCULAR | Status: AC
  Filled 2016-09-08: qty 20

## 2016-09-08 MED ORDER — ISOSORBIDE MONONITRATE ER 60 MG PO TB24
60.0000 mg | ORAL_TABLET | Freq: Every day | ORAL | Status: DC
  Administered 2016-09-08 – 2016-09-09 (×2): 60 mg via ORAL
  Filled 2016-09-08 (×3): qty 1

## 2016-09-08 MED ORDER — MIDAZOLAM HCL 2 MG/2ML IJ SOLN
INTRAMUSCULAR | Status: DC | PRN
  Administered 2016-09-08: 1 mg via INTRAVENOUS

## 2016-09-08 MED ORDER — FENTANYL CITRATE (PF) 100 MCG/2ML IJ SOLN
INTRAMUSCULAR | Status: AC
  Filled 2016-09-08: qty 2

## 2016-09-08 SURGICAL SUPPLY — 15 items
CATH INFINITI 5 FR IM (CATHETERS) ×1 IMPLANT
CATH INFINITI 5 FR MPA2 (CATHETERS) ×2 IMPLANT
CATH INFINITI 5 FR RCB (CATHETERS) ×1 IMPLANT
CATH INFINITI 5F PIG 125CM (CATHETERS) ×1 IMPLANT
CATH INFINITI 5FR JL5 (CATHETERS) ×1 IMPLANT
CATH INFINITI 5FR JR4 125CM (CATHETERS) ×1 IMPLANT
CATH INFINITI 5FR MULTPACK ANG (CATHETERS) ×1 IMPLANT
KIT HEART LEFT (KITS) ×2 IMPLANT
PACK CARDIAC CATHETERIZATION (CUSTOM PROCEDURE TRAY) ×2 IMPLANT
SHEATH PINNACLE 5F 10CM (SHEATH) ×2 IMPLANT
SYR MEDRAD MARK V 150ML (SYRINGE) ×2 IMPLANT
TRANSDUCER W/STOPCOCK (MISCELLANEOUS) ×2 IMPLANT
WIRE EMERALD 3MM-J .035X150CM (WIRE) ×1 IMPLANT
WIRE EMERALD 3MM-J .035X260CM (WIRE) ×2 IMPLANT
WIRE HI TORQ VERSACORE-J 145CM (WIRE) ×2 IMPLANT

## 2016-09-08 NOTE — Progress Notes (Signed)
Patient's BP now at 148/91 after hydralazine administration.

## 2016-09-08 NOTE — Progress Notes (Signed)
Progress Note  Patient Name: Dylan Morgan Date of Encounter: 09/08/2016  Primary Cardiologist: Chacra   Since planned femoral approach, procedure was pushed back to allow further normalization of INR. Still no luck with obtaining surgical records from [facility]. No difficulty overnight based upon clinical data in the electronic health chart. Creatinine is further improved.  Inpatient Medications    Scheduled Meds: . [MAR Hold] aspirin  325 mg Oral Daily  . [MAR Hold] Melatonin  3 mg Oral QHS  . [MAR Hold] nitroGLYCERIN  1 inch Topical Q6H  . [MAR Hold] simvastatin  40 mg Oral QPC supper  . sodium chloride flush  3 mL Intravenous Q12H   Continuous Infusions: . sodium chloride    . sodium chloride 75 mL/hr at 09/08/16 0052  . heparin 1,500 Units/hr (09/07/16 1245)   PRN Meds: sodium chloride, [MAR Hold] acetaminophen **OR** [MAR Hold] acetaminophen, [MAR Hold] guaiFENesin-dextromethorphan, [MAR Hold] ipratropium-albuterol, sodium chloride flush   Vital Signs    Vitals:   09/07/16 1937 09/08/16 0503 09/08/16 0506 09/08/16 0729  BP: 116/60 (!) 145/71 (!) 145/71   Pulse: 76  73   Resp: 18 19 18    Temp: 97.8 F (36.6 C)  98.5 F (36.9 C)   TempSrc: Oral  Oral   SpO2: 100%  100% 99%  Weight:   231 lb 11.2 oz (105.1 kg)   Height:        Intake/Output Summary (Last 24 hours) at 09/08/16 0732 Last data filed at 09/08/16 0552  Gross per 24 hour  Intake          1683.75 ml  Output             1400 ml  Net           283.75 ml   Filed Weights   09/06/16 1449 09/07/16 0505 09/08/16 0506  Weight: 237 lb 14.4 oz (107.9 kg) 240 lb (108.9 kg) 231 lb 11.2 oz (105.1 kg)    Telemetry   Normal sinus rhythm with PACs - Personally Reviewed  ECG    Not repeated - Personally Reviewed  Physical Exam  Well compensated with good skin color GEN: No acute distress.   Neck: No JVD Cardiac: RRR, no murmurs, rubs, or gallops.  Respiratory: Clear to  auscultation bilaterally. GI: Soft, nontender, non-distended  MS: No edema; No deformity. Neuro:  Nonfocal  Psych: Normal affect   Labs    Chemistry Recent Labs Lab 09/06/16 0455 09/06/16 0516 09/07/16 0451 09/08/16 0459  NA 140 142 138 138  K 3.9 3.7 3.7 4.2  CL 105 105 105 104  CO2 24  --  24 25  GLUCOSE 125* 123* 116* 116*  BUN 19 24* 17 16  CREATININE 1.57* 1.60* 1.53* 1.39*  CALCIUM 9.2  --  8.8* 9.0  PROT 6.9  --   --   --   ALBUMIN 4.0  --   --   --   AST 33  --   --   --   ALT 17  --   --   --   ALKPHOS 73  --   --   --   BILITOT 0.9  --   --   --   GFRNONAA 42*  --  43* 49*  GFRAA 49*  --  50* 56*  ANIONGAP 11  --  9 9     Hematology Recent Labs Lab 09/06/16 0455 09/06/16 0516 09/07/16 0451 09/08/16 0459  WBC 4.0  --  3.1* 4.9  RBC 4.54  --  3.83* 4.11*  HGB 13.4 13.6 11.1* 12.1*  HCT 40.3 40.0 34.3* 36.7*  MCV 88.8  --  89.6 89.3  MCH 29.5  --  29.0 29.4  MCHC 33.3  --  32.4 33.0  RDW 14.1  --  14.1 14.1  PLT 138*  --  107* 120*    Cardiac Enzymes Recent Labs Lab 09/06/16 1138 09/06/16 1931 09/06/16 2258  TROPONINI 0.85* 2.91* 2.89*    Recent Labs Lab 09/06/16 0500  TROPIPOC 0.00    INR/Prothrombin Time - 1.49 at 5 AM today    BNPNo results for input(s): BNP, PROBNP in the last 168 hours.   DDimer No results for input(s): DDIMER in the last 168 hours.   Radiology    Dg Chest 2 View  Result Date: 09/06/2016 CLINICAL DATA:  Chest pain EXAM: CHEST  2 VIEW COMPARISON:  07/08/2016 FINDINGS: Cardiomegaly. Prior CABG. Lungs are clear. No effusions or acute bony abnormality. IMPRESSION: Cardiomegaly.  No active disease. Electronically Signed   By: Rolm Baptise M.D.   On: 09/06/2016 09:27   Ct Angio Chest/abd/pel For Dissection W And/or Wo Contrast  Result Date: 09/06/2016 CLINICAL DATA:  Chest and abdominal pain. History of prior EVAR in 2010 to treat abdominal aortic aneurysm EXAM: CT ANGIOGRAPHY CHEST, ABDOMEN AND PELVIS TECHNIQUE:  Multidetector CT imaging through the chest, abdomen and pelvis was performed using the standard protocol during bolus administration of intravenous contrast. Multiplanar reconstructed images and MIPs were obtained and reviewed to evaluate the vascular anatomy. CONTRAST:  80 mL Isovue 370 IV COMPARISON:  Prior CTA of the abdomen and pelvis on 08/10/2016 as well as multiple prior imaging studies post EVAR. Prior CT of the chest without contrast on 12/15/2015. FINDINGS: CTA CHEST FINDINGS Cardiovascular: The thoracic aorta is well opacified and shows no evidence of acute dissection. There is aneurysmal disease of the ascending thoracic aorta which measures 4.6 cm in greatest diameter. The aorta measures approximately 4.2 cm at the sinuses of Valsalva. Aortic arch and descending thoracic aorta are normal in caliber. Calcified and noncalcified plaque at the origin of the right subclavian artery causes significant stenosis of approximately 70- 75% narrowing with poststenotic dilatation present. Mild plaque is present in the proximal innominate artery, proximal left common carotid artery and proximal left subclavian artery without significant stenosis. Pulmonary arterial opacification is suboptimal at the timing of the study. There is evidence of prior CABG. The heart size is stable. No pericardial fluid. Mediastinum/Nodes: No enlarged mediastinal, hilar, or axillary lymph nodes. Thyroid gland, trachea, and esophagus demonstrate no significant findings. Lungs/Pleura: There is no evidence of pulmonary edema, consolidation, pneumothorax, nodule or pleural fluid. 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 Review of the MIP images confirms the above findings. CTA ABDOMEN AND PELVIS FINDINGS VASCULAR Aorta: Stable positioning of aortic endograft since the prior study. There remains evidence of slight distal migration of the proximal end of the endograft, especially along its left infrarenal  margin. There is no associated type 1 endoleak. Aneurysm sac is stable measuring 4.7 x 5.3 cm and demonstrating no evidence of type 2 endoleak. Distal common iliac limbs show stable positioning and patency. Since aneurysm repair, there is some gradual increase caliber of the proximal and mid abdominal aorta superior to the endograft. The juxtarenal aorta measures 4.1 cm in maximum diameter and measured 3.5 cm at a comparable level prior to aneurysm repair. The aorta measures 4 cm at the level of  the SMA origin. Celiac: Stable moderate stenosis, 50- 60%. SMA: Stable stenosis, 40- 50%. Renals: Stable patency of single right and 2 separate left renal arteries without significant stenosis. IMA: Occluded origin.  Distal branches reconstituted. Inflow: Native iliac arteries show stable patency. There is stable mild aneurysmal dilatation of the proximal right internal iliac artery trunk measuring 13 mm. Common femoral arteries and femoral bifurcations show normal patency. Review of the MIP images confirms the above findings. NON-VASCULAR Hepatobiliary: No focal liver abnormality is seen. No gallstones, gallbladder wall thickening, or biliary dilatation. Pancreas: Unremarkable. No pancreatic ductal dilatation or surrounding inflammatory changes. Spleen: Normal in size without focal abnormality. Adrenals/Urinary Tract: Adrenal glands are unremarkable. Kidneys are normal, without renal calculi, focal lesion, or hydronephrosis. Bladder is unremarkable. Stomach/Bowel: No abnormality identified involving bowel. No free air. No abnormal fluid collections. Lymphatic: No enlarged lymph nodes identified. Reproductive: Prostate is unremarkable. Other: No abdominal wall hernia or abnormality. No abdominopelvic ascites. Musculoskeletal: Lumbar degenerative disc disease present with associated mild leftward convex scoliosis. Review of the MIP images confirms the above findings. IMPRESSION: 1. Aneurysmal disease of the ascending thoracic  aorta measuring up to 4.6 cm in maximum diameter. No thoracic aortic dissection identified. Recommend semi-annual imaging followup by CTA or MRA and referral to cardiothoracic surgery if not already obtained. This recommendation follows 2010 ACCF/AHA/AATS/ACR/ASA/SCA/SCAI/SIR/STS/SVM Guidelines for the Diagnosis and Management of Patients With Thoracic Aortic Disease. Circulation. 2010; 121: e266-e36 2. Significant stenosis at the origin of the right subclavian artery approaching 70- 75% in estimated caliber. Recommend correlation with discrepancy in blood pressure measurement in the upper extremities and with any symptoms of right upper extremity claudication. 3. Stable CTA of the abdomen and pelvis since recent imaging last month. There remains evidence of slight distal migration of the proximal endograft since placement without evidence of endoleak. Aneurysmal disease of the native aorta superior to the endograft does show slow progression since 2010 with enlargement of the juxtarenal and proximal abdominal aorta up to 4.1 cm in maximum diameter. No evidence of abdominal aortic dissection. Electronically Signed   By: Aletta Edouard M.D.   On: 09/06/2016 10:09    Cardiac Studies   No new data  Patient Profile     71 y.o. male hx of AAA (s/p stenting in 2010), arthritis, bradycardia, CAD (s/p CABG 3v (grafts unknown, 2004), CVA, GERD, hyperlipidemia, hypertension, paroxysmal atrial fibrillation 2010, CKD III, tobacco abuse,and B12 deficiency who presented with chest pain, and + troponin.   Assessment & Plan    1. Non-ST elevation myocardial infarction. Stable since admission. Suspect bypass graft occlusion. For coronary angiography today via the femoral approach. 2. CKD III, with improved creatinine from hydration, currently 1.39. Will monitor post contrast exposure. 3. Paroxysmal atrial fibrillation, maintaining normal sinus rhythm with PACs. 4. Chronic anticoagulation therapy, INR 1.49 today 5.  Abdominal aortic aneurysm, stable  Signed, Sinclair Grooms, MD  09/08/2016, 7:32 AM

## 2016-09-08 NOTE — Progress Notes (Addendum)
Cardiology Follow-up Note  Reviewed today's cath with Dr. Claiborne Billings.  The saphenous vein graft to the diagonal is essentially totally occluded.  Plan medical therapy with hopeful discharge in a.m. if no angina with ambulation. Have added Imdur 60 mg per day.  Resume Coumadin. Since he is in sinus rhythm I do not feel strongly that he needs overlapped therapy. Add Plavix for at least 1 month (did not receive stent).

## 2016-09-08 NOTE — H&P (View-Only) (Signed)
The patient has been seen in conjunction with Reino Bellis, NP. All aspects of care have been considered and discussed. The patient has been personally interviewed, examined, and all clinical data has been reviewed.   Stable overnight. Rising troponin compatible with non-ST elevation myocardial infarction.  Repeat INR at 11 AM was 1.8. I feel he is probably safe to proceed with coronary angiography via radial approach but will leave to the discretion of colleague.  Overnight hydration has slightly improved creatinine down to 1.5.   Suspect bypass graft failure. Unable to find report from [place]. Based on historical records within our system and the patient, he had three-vessel bypass. He doesn't know if LIMA use occurred.   Progress Note  Patient Name: Dylan Morgan Date of Encounter: 09/07/2016  Primary Cardiologist: Irish Lack  Subjective   No further chest pain.   Inpatient Medications    Scheduled Meds: . aspirin  325 mg Oral Daily  . nitroGLYCERIN  1 inch Topical Q6H  . simvastatin  40 mg Oral QPC supper  . sodium chloride flush  3 mL Intravenous Q12H   Continuous Infusions: . sodium chloride    . sodium chloride 100 mL/hr (09/07/16 0735)  . heparin 1,500 Units/hr (09/06/16 2345)   PRN Meds: sodium chloride, acetaminophen **OR** acetaminophen, guaiFENesin-dextromethorphan, ipratropium-albuterol, sodium chloride flush   Vital Signs    Vitals:   09/06/16 2342 09/07/16 0010 09/07/16 0505 09/07/16 0737  BP: 116/64  (!) 125/58 130/81  Pulse: 64  (!) 52 60  Resp: 16  11 15   Temp: 98.3 F (36.8 C)  97.9 F (36.6 C)   TempSrc: Oral  Oral   SpO2: 96% 96% 96% 95%  Weight:   240 lb (108.9 kg)   Height:        Intake/Output Summary (Last 24 hours) at 09/07/16 0816 Last data filed at 09/07/16 0509  Gross per 24 hour  Intake            444.8 ml  Output              600 ml  Net           -155.2 ml   Filed Weights   09/06/16 1216 09/06/16 1449 09/07/16 0505    Weight: 240 lb 4.8 oz (109 kg) 237 lb 14.4 oz (107.9 kg) 240 lb (108.9 kg)    Telemetry    SR, PACs - Personally Reviewed  ECG    N/A - Personally Reviewed  Physical Exam   General: Older obese W male appearing in no acute distress. Head: Normocephalic, atraumatic.  Neck: Supple without bruits, JVD. Lungs:  Resp regular and unlabored, CTA. Heart: RRR, S1, S2, no S3, S4, or murmur; no rub. Abdomen: Soft, non-tender, non-distended with normoactive bowel sounds. No hepatomegaly. No rebound/guarding. No obvious abdominal masses. Extremities: No clubbing, cyanosis, trace LE edema. Distal pedal pulses are 2+ bilaterally. Neuro: Alert and oriented X 3. Moves all extremities spontaneously. Psych: Normal affect.  Labs    Chemistry Recent Labs Lab 09/06/16 0455 09/06/16 0516 09/07/16 0451  NA 140 142 138  K 3.9 3.7 3.7  CL 105 105 105  CO2 24  --  24  GLUCOSE 125* 123* 116*  BUN 19 24* 17  CREATININE 1.57* 1.60* 1.53*  CALCIUM 9.2  --  8.8*  PROT 6.9  --   --   ALBUMIN 4.0  --   --   AST 33  --   --   ALT 17  --   --  ALKPHOS 73  --   --   BILITOT 0.9  --   --   GFRNONAA 42*  --  43*  GFRAA 49*  --  50*  ANIONGAP 11  --  9     Hematology Recent Labs Lab 09/06/16 0455 09/06/16 0516 09/07/16 0451  WBC 4.0  --  3.1*  RBC 4.54  --  3.83*  HGB 13.4 13.6 11.1*  HCT 40.3 40.0 34.3*  MCV 88.8  --  89.6  MCH 29.5  --  29.0  MCHC 33.3  --  32.4  RDW 14.1  --  14.1  PLT 138*  --  PENDING    Cardiac Enzymes Recent Labs Lab 09/06/16 1138 09/06/16 1931 09/06/16 2258  TROPONINI 0.85* 2.91* 2.89*    Recent Labs Lab 09/06/16 0500  TROPIPOC 0.00     BNPNo results for input(s): BNP, PROBNP in the last 168 hours.   DDimer No results for input(s): DDIMER in the last 168 hours.    Radiology    Dg Chest 2 View  Result Date: 09/06/2016 CLINICAL DATA:  Chest pain EXAM: CHEST  2 VIEW COMPARISON:  07/08/2016 FINDINGS: Cardiomegaly. Prior CABG. Lungs are clear.  No effusions or acute bony abnormality. IMPRESSION: Cardiomegaly.  No active disease. Electronically Signed   By: Rolm Baptise M.D.   On: 09/06/2016 09:27   Ct Angio Chest/abd/pel For Dissection W And/or Wo Contrast  Result Date: 09/06/2016 CLINICAL DATA:  Chest and abdominal pain. History of prior EVAR in 2010 to treat abdominal aortic aneurysm EXAM: CT ANGIOGRAPHY CHEST, ABDOMEN AND PELVIS TECHNIQUE: Multidetector CT imaging through the chest, abdomen and pelvis was performed using the standard protocol during bolus administration of intravenous contrast. Multiplanar reconstructed images and MIPs were obtained and reviewed to evaluate the vascular anatomy. CONTRAST:  80 mL Isovue 370 IV COMPARISON:  Prior CTA of the abdomen and pelvis on 08/10/2016 as well as multiple prior imaging studies post EVAR. Prior CT of the chest without contrast on 12/15/2015. FINDINGS: CTA CHEST FINDINGS Cardiovascular: The thoracic aorta is well opacified and shows no evidence of acute dissection. There is aneurysmal disease of the ascending thoracic aorta which measures 4.6 cm in greatest diameter. The aorta measures approximately 4.2 cm at the sinuses of Valsalva. Aortic arch and descending thoracic aorta are normal in caliber. Calcified and noncalcified plaque at the origin of the right subclavian artery causes significant stenosis of approximately 70- 75% narrowing with poststenotic dilatation present. Mild plaque is present in the proximal innominate artery, proximal left common carotid artery and proximal left subclavian artery without significant stenosis. Pulmonary arterial opacification is suboptimal at the timing of the study. There is evidence of prior CABG. The heart size is stable. No pericardial fluid. Mediastinum/Nodes: No enlarged mediastinal, hilar, or axillary lymph nodes. Thyroid gland, trachea, and esophagus demonstrate no significant findings. Lungs/Pleura: There is no evidence of pulmonary edema, consolidation,  pneumothorax, nodule or pleural fluid. 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 Review of the MIP images confirms the above findings. CTA ABDOMEN AND PELVIS FINDINGS VASCULAR Aorta: Stable positioning of aortic endograft since the prior study. There remains evidence of slight distal migration of the proximal end of the endograft, especially along its left infrarenal margin. There is no associated type 1 endoleak. Aneurysm sac is stable measuring 4.7 x 5.3 cm and demonstrating no evidence of type 2 endoleak. Distal common iliac limbs show stable positioning and patency. Since aneurysm repair, there is some gradual increase caliber of  the proximal and mid abdominal aorta superior to the endograft. The juxtarenal aorta measures 4.1 cm in maximum diameter and measured 3.5 cm at a comparable level prior to aneurysm repair. The aorta measures 4 cm at the level of the SMA origin. Celiac: Stable moderate stenosis, 50- 60%. SMA: Stable stenosis, 40- 50%. Renals: Stable patency of single right and 2 separate left renal arteries without significant stenosis. IMA: Occluded origin.  Distal branches reconstituted. Inflow: Native iliac arteries show stable patency. There is stable mild aneurysmal dilatation of the proximal right internal iliac artery trunk measuring 13 mm. Common femoral arteries and femoral bifurcations show normal patency. Review of the MIP images confirms the above findings. NON-VASCULAR Hepatobiliary: No focal liver abnormality is seen. No gallstones, gallbladder wall thickening, or biliary dilatation. Pancreas: Unremarkable. No pancreatic ductal dilatation or surrounding inflammatory changes. Spleen: Normal in size without focal abnormality. Adrenals/Urinary Tract: Adrenal glands are unremarkable. Kidneys are normal, without renal calculi, focal lesion, or hydronephrosis. Bladder is unremarkable. Stomach/Bowel: No abnormality identified involving bowel. No free air. No  abnormal fluid collections. Lymphatic: No enlarged lymph nodes identified. Reproductive: Prostate is unremarkable. Other: No abdominal wall hernia or abnormality. No abdominopelvic ascites. Musculoskeletal: Lumbar degenerative disc disease present with associated mild leftward convex scoliosis. Review of the MIP images confirms the above findings. IMPRESSION: 1. Aneurysmal disease of the ascending thoracic aorta measuring up to 4.6 cm in maximum diameter. No thoracic aortic dissection identified. Recommend semi-annual imaging followup by CTA or MRA and referral to cardiothoracic surgery if not already obtained. This recommendation follows 2010 ACCF/AHA/AATS/ACR/ASA/SCA/SCAI/SIR/STS/SVM Guidelines for the Diagnosis and Management of Patients With Thoracic Aortic Disease. Circulation. 2010; 121: e266-e36 2. Significant stenosis at the origin of the right subclavian artery approaching 70- 75% in estimated caliber. Recommend correlation with discrepancy in blood pressure measurement in the upper extremities and with any symptoms of right upper extremity claudication. 3. Stable CTA of the abdomen and pelvis since recent imaging last month. There remains evidence of slight distal migration of the proximal endograft since placement without evidence of endoleak. Aneurysmal disease of the native aorta superior to the endograft does show slow progression since 2010 with enlargement of the juxtarenal and proximal abdominal aorta up to 4.1 cm in maximum diameter. No evidence of abdominal aortic dissection. Electronically Signed   By: Aletta Edouard M.D.   On: 09/06/2016 10:09    Cardiac Studies   N/a   Patient Profile     71 y.o. male hx of AAA (s/p stenting in 2010), arthritis, bradycardia, CAD (s/p CABG 3v (grafts unknown, 2004), CVA, GERD, hyperlipidemia, hypertension, paroxysmal atrial fibrillation 2010, renal insufficiency, tobacco abuse,and B12 deficiency who presented with chest pain, and + troponin.    Assessment & Plan    1. NSTEMI: Presented with chest pain that was similar to previous cardiac events. Troponin peaked at 2.9, now trending down. No further chest pain.  -- on IV heparin -- planned for cardiac cath today, but INR still >2 this morning. Plan to recheck this afternoon around 2pm.   2. CKD: Cr improved to 1.5 this morning with IVFs.  -- daily BMET  3. PAF: SR on tele -- Coumadin held--> IV heparin in anticipation for cath.  4. AAA: 4.6cm on CTA on admission from 4.1cm in 2010, no dissection noted.   Signed, Reino Bellis, NP  09/07/2016, 8:16 AM

## 2016-09-08 NOTE — Progress Notes (Signed)
Internal Medicine Attending:   I saw and examined the patient. I reviewed the resident's note and I agree with the resident's findings and plan as documented in the resident's note. Patient seen post cath, mildly confused due to sedatives, right groin bandage dry.  It appears he has been started on aggrastat, we are awaiting cath report from cardiology.

## 2016-09-08 NOTE — Progress Notes (Signed)
ANTICOAGULATION CONSULT NOTE - Follow Up Consult  Pharmacy Consult for Coumadin Indication: atrial fibrillation  Allergies  Allergen Reactions  . Diltiazem Hcl Itching    Patient Measurements: Height: 5\' 10"  (177.8 cm) Weight: 231 lb 11.2 oz (105.1 kg) IBW/kg (Calculated) : 73  Vital Signs: Temp: 98.5 F (36.9 C) (06/08 0506) Temp Source: Oral (06/08 0506) BP: 146/95 (06/08 1326) Pulse Rate: 70 (06/08 1326)  Labs:  Recent Labs  09/06/16 0455 09/06/16 0516 09/06/16 1138  09/06/16 1931 09/06/16 2258 09/07/16 0451 09/07/16 0734 09/07/16 1103 09/08/16 0459  HGB 13.4 13.6  --   --   --   --  11.1*  --   --  12.1*  HCT 40.3 40.0  --   --   --   --  34.3*  --   --  36.7*  PLT 138*  --   --   --   --   --  107*  --   --  120*  LABPROT  --   --   --   < >  --   --  23.4*  --  21.3* 18.2*  INR  --   --   --   < >  --   --  2.04  --  1.82 1.49  HEPARINUNFRC  --   --   --   --   --  0.19*  --  0.53  --  0.60  CREATININE 1.57* 1.60*  --   --   --   --  1.53*  --   --  1.39*  TROPONINI  --   --  0.85*  --  2.91* 2.89*  --   --   --   --   < > = values in this interval not displayed.  Estimated Creatinine Clearance: 57.4 mL/min (A) (by C-G formula based on SCr of 1.39 mg/dL (H)).  Assessment:   71 yr old male with NSTEM, s/p cardiac cath today   On Coumadin prior to admission for PAF.  Held fof cath. INR down to 1.49 today.   Was IV heparin pre-cath, but not resuming post-procedure.  Coumadin to resume without bridge. Plavix added.  Discussed with Dr. Tamala Julian. Plan Plavix for about a month, then ASA 81 mg.        Home Coumadin regimen:  4.5 mg daily except 3 mg on Tuesdays     - last outpatient INR 2.1 on 09/01/17  Goal of Therapy:  INR 2-3 Monitor platelets by anticoagulation protocol: Yes   Plan:   Coumadin 6. Mg x 1 today.  Daily PT/INR.   Expect he can go home on prior Coumadin regimen on 6/9.  Discussed plan for Plavix for at least a month, then ASA 81 mg.   Arty Baumgartner, [place] Pager: [phone] 09/08/2016,3:46 PM

## 2016-09-08 NOTE — Progress Notes (Signed)
   Subjective: Dylan Morgan was seen just as he arrived back at his room after the cath procedure today. He denies chest pain or discomfort or difficulty breathing, he says that he is very hungry.    Objective:  Vital signs in last 24 hours: Vitals:   09/08/16 1045 09/08/16 1050 09/08/16 1055 09/08/16 1111  BP: (!) 142/73 (!) 146/77 138/86 139/85  Pulse: (!) 58 60 (!) 28 (!) 54  Resp: 11 12 11 12   Temp:      TempSrc:      SpO2: 97% 97% 99% 98%  Weight:      Height:       Physical Exam  Constitutional: He is well-developed, well-nourished, and in no distress. No distress.  HENT:  Head: Normocephalic and atraumatic.  Mouth/Throat: No oropharyngeal exudate.  Eyes: Conjunctivae are normal. Right eye exhibits no discharge. Left eye exhibits no discharge. No scleral icterus.  Cardiovascular: Normal rate and regular rhythm.   No murmur heard. Pulmonary/Chest: Effort normal and breath sounds normal. No respiratory distress. He has no wheezes. He has no rales.  Abdominal: Soft. Bowel sounds are normal. He exhibits no distension. There is no tenderness. There is no guarding.  Neurological:  drowsy  Skin: Skin is warm and dry. He is not diaphoretic.  Right femoral cath site dressing clean, dry, and intact   Psychiatric: Affect and judgment normal.   Assessment/Plan:  Principal Problem:   Non-ST elevation MI (NSTEMI) ([place]) Active Problems:   CKD (chronic kidney disease), stage III   Sleep apnea   Hypertension   Paroxysmal atrial fibrillation (HCC)   Hx of CABG   Long term (current) use of anticoagulants   S/P AAA (abdominal aortic aneurysm) repair   Unstable angina (HCC)  NSTEMI, hx of CABG Went for cath today and is on tirofiban post procedure. He seems to have tolerated the procedure well and is comfortable and chest pain free at this time.  -continue home med simvastatin 40 mg qd and atorvastatin  - holding off on starting a beta blocker for bradycardia  -cardiology is  following, we appreciate their recommendations   CKD stage III  Crt improved 1.3 this morning with IVFs, baseline crt is around 1.3.  -follow daily BMP   Paroxysmal atrial fibrillation  NSR on exam   - hold home coumadin, on IV heparin for NSTEMI   S/p AAA  CTAngio in the ED revealed progression of the aneurysm since 2010 up to 4.6 cm in maximum diameter. Asymptomatic, no new diastolic murmur.  -continue to monitor   COPD  Continue home med duonebs q6h PRN.   Hypertension  BP is controlled, continue home med lisinopril 5 mg.   Obstructive sleep apnea  - ordered CPAP   Dispo: Anticipated discharge in approximately 2-4 day(s).   Ledell Noss, MD 09/08/2016, 12:25 PM Pager: [phone]

## 2016-09-08 NOTE — Plan of Care (Signed)
Problem: Safety: Goal: Ability to remain free from injury will improve Outcome: Progressing Fall risk bundle in place. No injuries this shift. Cath site is level 0 with pulses +2. Denies pain or discomfort.

## 2016-09-08 NOTE — Addendum Note (Signed)
Addended by: Lianne Cure A on: 09/08/2016 09:29 AM   Modules accepted: Orders

## 2016-09-08 NOTE — Procedures (Signed)
Patient refused CPAP.

## 2016-09-08 NOTE — Discharge Instructions (Addendum)

## 2016-09-08 NOTE — Interval H&P Note (Signed)
Cath Lab Visit (complete for each Cath Lab visit)  Clinical Evaluation Leading to the Procedure:   ACS: Yes.    Non-ACS:    Anginal Classification: CCS IV  Anti-ischemic medical therapy: Minimal Therapy (1 class of medications)  Non-Invasive Test Results: No non-invasive testing performed  Prior CABG: Previous CABG      History and Physical Interval Note:  09/08/2016 7:38 AM  Dylan Morgan  has presented today for surgery, with the diagnosis of cad  The various methods of treatment have been discussed with the patient and family. After consideration of risks, benefits and other options for treatment, the patient has consented to  Procedure(s): Left Heart Cath and Cors/Grafts Angiography (N/A) as a surgical intervention .  The patient's history has been reviewed, patient examined, no change in status, stable for surgery.  I have reviewed the patient's chart and labs.  Questions were answered to the patient's satisfaction.     Shelva Majestic

## 2016-09-08 NOTE — Progress Notes (Signed)
Site area: rt groin fa sheath Site Prior to Removal:  Level 0 Pressure Applied For: 20 minutes Manual:   yes Patient Status During Pull:  stable Post Pull Site:  Level 0 Post Pull Instructions Given:  yes Post Pull Pulses Present: palpable Dressing Applied:  Gauze and tegaderm Bedrest begins @ 1055 Comments:

## 2016-09-09 DIAGNOSIS — I712 Thoracic aortic aneurysm, without rupture: Secondary | ICD-10-CM

## 2016-09-09 DIAGNOSIS — Z888 Allergy status to other drugs, medicaments and biological substances status: Secondary | ICD-10-CM

## 2016-09-09 DIAGNOSIS — Z79899 Other long term (current) drug therapy: Secondary | ICD-10-CM

## 2016-09-09 DIAGNOSIS — I129 Hypertensive chronic kidney disease with stage 1 through stage 4 chronic kidney disease, or unspecified chronic kidney disease: Secondary | ICD-10-CM

## 2016-09-09 LAB — CBC
HEMATOCRIT: 35.6 % — AB (ref 39.0–52.0)
Hemoglobin: 11.6 g/dL — ABNORMAL LOW (ref 13.0–17.0)
MCH: 29.3 pg (ref 26.0–34.0)
MCHC: 32.6 g/dL (ref 30.0–36.0)
MCV: 89.9 fL (ref 78.0–100.0)
PLATELETS: 113 10*3/uL — AB (ref 150–400)
RBC: 3.96 MIL/uL — ABNORMAL LOW (ref 4.22–5.81)
RDW: 14.4 % (ref 11.5–15.5)
WBC: 5 10*3/uL (ref 4.0–10.5)

## 2016-09-09 LAB — BASIC METABOLIC PANEL
ANION GAP: 10 (ref 5–15)
BUN: 12 mg/dL (ref 6–20)
CALCIUM: 9.1 mg/dL (ref 8.9–10.3)
CO2: 23 mmol/L (ref 22–32)
CREATININE: 1.34 mg/dL — AB (ref 0.61–1.24)
Chloride: 106 mmol/L (ref 101–111)
GFR calc Af Amer: 59 mL/min — ABNORMAL LOW (ref >60)
GFR, EST NON AFRICAN AMERICAN: 51 mL/min — AB (ref >60)
GLUCOSE: 97 mg/dL (ref 65–99)
Potassium: 4 mmol/L (ref 3.5–5.1)
Sodium: 139 mmol/L (ref 135–145)

## 2016-09-09 LAB — PROTIME-INR
INR: 1.3
Prothrombin Time: 16.3 seconds — ABNORMAL HIGH (ref 11.4–15.2)

## 2016-09-09 LAB — GLUCOSE, CAPILLARY
Glucose-Capillary: 106 mg/dL — ABNORMAL HIGH (ref 65–99)
Glucose-Capillary: 104 mg/dL — ABNORMAL HIGH (ref 65–99)

## 2016-09-09 MED ORDER — ATORVASTATIN CALCIUM 80 MG PO TABS: 80.0000 mg | ORAL_TABLET | Freq: Every day | ORAL | 0 refills | Status: DC

## 2016-09-09 MED ORDER — ISOSORBIDE MONONITRATE ER 60 MG PO TB24: 60.0000 mg | ORAL_TABLET | Freq: Every day | ORAL | 0 refills | Status: DC

## 2016-09-09 MED ORDER — WARFARIN SODIUM 5 MG PO TABS: 6.0000 mg | ORAL_TABLET | Freq: Once | ORAL | Status: DC

## 2016-09-09 MED ORDER — CLOPIDOGREL BISULFATE 75 MG PO TABS: 75.0000 mg | ORAL_TABLET | Freq: Every day | ORAL | 0 refills | Status: DC

## 2016-09-09 NOTE — Progress Notes (Signed)
Progress Note  Patient Name: Dylan Morgan Date of Encounter: 09/09/2016  Primary Cardiologist: Irish Lack  Subjective   Eating breakfast no complaints wants to go home   Inpatient Medications    Scheduled Meds: . atorvastatin  80 mg Oral q1800  . clopidogrel  75 mg Oral Daily  . isosorbide mononitrate  60 mg Oral Daily  . Melatonin  3 mg Oral QHS  . sodium chloride flush  3 mL Intravenous Q12H  . warfarin  6 mg Oral ONCE-1800  . Warfarin - Pharmacist Dosing Inpatient   Does not apply q1800   Continuous Infusions: . sodium chloride 75 mL/hr at 09/09/16 0300  . sodium chloride     PRN Meds: sodium chloride, acetaminophen, diazepam, guaiFENesin-dextromethorphan, ipratropium-albuterol, ondansetron (ZOFRAN) IV, sodium chloride flush   Vital Signs    Vitals:   09/08/16 1326 09/08/16 2028 09/09/16 0439 09/09/16 0542  BP: (!) 146/95 (!) 133/115  120/61  Pulse: 70 69  66  Resp: 16   12  Temp:    98 F (36.7 C)  TempSrc:    Oral  SpO2: 97% 98%  99%  Weight:   236 lb 6.4 oz (107.2 kg)   Height:        Intake/Output Summary (Last 24 hours) at 09/09/16 0812 Last data filed at 09/09/16 0659  Gross per 24 hour  Intake          1078.75 ml  Output             1226 ml  Net          -147.25 ml   Filed Weights   09/07/16 0505 09/08/16 0506 09/09/16 0439  Weight: 240 lb (108.9 kg) 231 lb 11.2 oz (105.1 kg) 236 lb 6.4 oz (107.2 kg)    Telemetry   Normal sinus rhythm with PACs - Personally Reviewed  ECG    Not repeated - Personally Reviewed  Physical Exam  BP 120/61 (BP Location: Right Arm)   Pulse 66   Temp 98 F (36.7 C) (Oral)   Resp 12   Ht 5\' 10"  (1.778 m)   Wt 236 lb 6.4 oz (107.2 kg)   SpO2 99%   BMI 33.92 kg/m  Affect appropriate Obese white male  HEENT: normal Neck supple with no adenopathy JVP normal no bruits no thyromegaly Lungs clear with no wheezing and good diaphragmatic motion Heart:  S1/S2 no murmur, no rub, gallop or click PMI  normal Abdomen: benighn, BS positve, no tenderness, no AAA no bruit.  No HSM or HJR Distal pulses intact with no bruits No edema Neuro non-focal Skin warm and dry No muscular weakness Right femoral artery no hematoma at cath sight    Labs    Chemistry Recent Labs Lab 09/06/16 0455  09/07/16 0451 09/08/16 0459 09/09/16 0428  NA 140  < > 138 138 139  K 3.9  < > 3.7 4.2 4.0  CL 105  < > 105 104 106  CO2 24  --  24 25 23   GLUCOSE 125*  < > 116* 116* 97  BUN 19  < > 17 16 12   CREATININE 1.57*  < > 1.53* 1.39* 1.34*  CALCIUM 9.2  --  8.8* 9.0 9.1  PROT 6.9  --   --   --   --   ALBUMIN 4.0  --   --   --   --   AST 33  --   --   --   --  ALT 17  --   --   --   --   ALKPHOS 73  --   --   --   --   BILITOT 0.9  --   --   --   --   GFRNONAA 42*  --  43* 49* 51*  GFRAA 49*  --  50* 56* 59*  ANIONGAP 11  --  9 9 10   < > = values in this interval not displayed.   Hematology  Recent Labs Lab 09/07/16 0451 09/08/16 0459 09/09/16 0428  WBC 3.1* 4.9 5.0  RBC 3.83* 4.11* 3.96*  HGB 11.1* 12.1* 11.6*  HCT 34.3* 36.7* 35.6*  MCV 89.6 89.3 89.9  MCH 29.0 29.4 29.3  MCHC 32.4 33.0 32.6  RDW 14.1 14.1 14.4  PLT 107* 120* 113*    Cardiac Enzymes  Recent Labs Lab 09/06/16 1138 09/06/16 1931 09/06/16 2258  TROPONINI 0.85* 2.91* 2.89*     Recent Labs Lab 09/06/16 0500  TROPIPOC 0.00    INR/Prothrombin Time - 1.49 at 5 AM today    BNPNo results for input(s): BNP, PROBNP in the last 168 hours.   DDimer No results for input(s): DDIMER in the last 168 hours.   Radiology    No results found.  Cardiac Studies   No new data  Patient Profile     71 y.o. male hx of AAA (s/p stenting in 2010), arthritis, bradycardia, CAD (s/p CABG 3v (grafts unknown, 2004), CVA, GERD, hyperlipidemia, hypertension, paroxysmal atrial fibrillation 2010, CKD III, tobacco abuse,and B12 deficiency who presented with chest pain, and + troponin.   Assessment & Plan    1. Non-ST  elevation myocardial infarction. Subtotal occlusion of SVG OM no attempt at opening 71 yo Graft with high thrombus burden Medical Rx Plavix and imdur added  2. CKD III, with improved creatinine from hydration, currently 1.34 3. Paroxysmal atrial fibrillation, maintaining normal sinus rhythm with PACs. 4. Chronic anticoagulation therapy, INR 1.3  Today Dr Tamala Julian ok with D/C before being RX since in NSR 5. Abdominal aortic aneurysm, stable   D/C home will arrange outpatient f/u with Irish Lack with INR check next week  Signed, Jenkins Rouge, MD  09/09/2016, 8:12 AM

## 2016-09-09 NOTE — Progress Notes (Signed)
ANTICOAGULATION CONSULT NOTE - Follow Up Consult  Pharmacy Consult for warfarin Indication: atrial fibrillation  Allergies  Allergen Reactions  . Diltiazem Hcl Itching    Patient Measurements: Height: 5\' 10"  (177.8 cm) Weight: 236 lb 6.4 oz (107.2 kg) IBW/kg (Calculated) : 73  Vital Signs: Temp: 98 F (36.7 C) (06/09 0542) Temp Source: Oral (06/09 0542) BP: 120/61 (06/09 0542) Pulse Rate: 66 (06/09 0542)  Labs:  Recent Labs  09/06/16 1138  09/06/16 1931 09/06/16 2258  09/07/16 0451 09/07/16 0734 09/07/16 1103 09/08/16 0459 09/09/16 0428  HGB  --   --   --   --   < > 11.1*  --   --  12.1* 11.6*  HCT  --   --   --   --   --  34.3*  --   --  36.7* 35.6*  PLT  --   --   --   --   --  107*  --   --  120* 113*  LABPROT  --   < >  --   --   --  23.4*  --  21.3* 18.2* 16.3*  INR  --   < >  --   --   --  2.04  --  1.82 1.49 1.30  HEPARINUNFRC  --   --   --  0.19*  --   --  0.53  --  0.60  --   CREATININE  --   --   --   --   --  1.53*  --   --  1.39* 1.34*  TROPONINI 0.85*  --  2.91* 2.89*  --   --   --   --   --   --   < > = values in this interval not displayed.  Estimated Creatinine Clearance: 60.2 mL/min (A) (by C-G formula based on SCr of 1.34 mg/dL (H)).  Assessment: 72 yoM on warfarin PTA for AFib presents with NSTEMI now s/p cardiac cath 6/8 found to have mobile thrombus and significant stenosis. Plan is to resume warfarin and add Plavix x6 months without heparin bridge per cards     Home Warfarin Dose =  4.5 mg daily except 3 mg on Tuesdays  Goal of Therapy:  INR 2-3 Monitor platelets by anticoagulation protocol: Yes   Plan:  -Warfarin 6mg  x1 tonight if remains in-patient - if discharged can likely be put on prior home regimen with close INR follow-up -INR and S/Sx bleeding daily while admitted  Arrie Senate, PharmD PGY-1 Pharmacy Resident Pager: [phone] 09/09/2016

## 2016-09-09 NOTE — Progress Notes (Signed)
Patient discharged home per MD. Discharge instructions given and patient and son verbalized understading

## 2016-09-09 NOTE — Progress Notes (Signed)
   Subjective:  Seen and examined this morning.  Denies chest pain or SOB.  Eager to go home.  Objective:  Vital signs in last 24 hours: Vitals:   09/08/16 2028 09/09/16 0439 09/09/16 0542 09/09/16 1019  BP: (!) 133/115  120/61 126/68  Pulse: 69  66 75  Resp:   12 (!) 22  Temp:   98 F (36.7 C)   TempSrc:   Oral   SpO2: 98%  99% 100%  Weight:  236 lb 6.4 oz (107.2 kg)    Height:       Physical Exam  Constitutional: He is oriented to person, place, and time and well-developed, well-nourished, and in no distress. No distress.  HENT:  Head: Normocephalic and atraumatic.  Eyes: Conjunctivae are normal. Right eye exhibits no discharge. Left eye exhibits no discharge. No scleral icterus.  Cardiovascular: Normal rate and regular rhythm.   No murmur heard. Pulmonary/Chest: Effort normal and breath sounds normal. No respiratory distress. He has no wheezes. He has no rales.  Neurological: He is alert and oriented to person, place, and time.  Skin: Skin is warm and dry. He is not diaphoretic.  Psychiatric: Affect and judgment normal.   Assessment/Plan:  Principal Problem:   Non-ST elevation MI (NSTEMI) ([place]) Active Problems:   CKD (chronic kidney disease), stage III   Sleep apnea   Hypertension   Paroxysmal atrial fibrillation (HCC)   Hx of CABG   Long term (current) use of anticoagulants   S/P AAA (abdominal aortic aneurysm) repair   Unstable angina (HCC)  NSTEMI, hx of CABG For cath yesterday with subtotal occlusion of saphenous vein graft.  No stents placed.  Plan medical therapy per cards.  Walked with RN today without angina.  Imdur added.  Plavix added for about 1 month then resume aspirin.  Follow up outpatient with [facility] and Cardiology  CKD stage III  Crt improved 1.34 this morning with IVFs, baseline crt is around 1.3.    Paroxysmal atrial fibrillation  NSR on exam   - Coumadin resumed yesterday.  F/u with Dr. Irish Lack next week with INR check per cards. - Home  Coumadin regimen:  4.5 mg daily except 3 mg on Tuesdays   Dispo: Anticipated discharge today.  Dylan Ser, DO 09/09/2016, 11:33 AM

## 2016-09-09 NOTE — Progress Notes (Signed)
Internal Medicine Attending:   I saw and examined the patient. I reviewed the resident's note and I agree with the resident's findings and plan as documented in the resident's note.  Patient feels well today with no new complaints. Patient was initially admitted with an NSTEMI and had an angiogram yesterday which showed a subtotal occlusion of the saphenous vein graft. Cardiology follow-up and recommendations appreciated. Patient to continue medical management for now. He was started on clopidogrel as well as Imdur on this admission. He'll need close follow-up as an outpatient with his cardiologist (Dr. Irish Lack). No further workup for now. Creatinine remains at baseline. We'll resume Coumadin for anticoagulation for his paroxysmal atrial fibrillation and have him follow-up for an INR check next week. Patient stable for discharge home today.

## 2016-09-09 NOTE — Discharge Summary (Signed)
Name: Dylan Morgan MRN: [id number] DOB: [birth year] 71 y.o. PCP: Aldine Contes, MD  Date of Admission: 09/06/2016  4:49 AM Date of Discharge: 09/09/2016 Attending Physician: No att. providers found  Discharge Diagnosis: 1. Non-ST elevation MI (NSTEMI) [facility])  Discharge Medications: Allergies as of 09/09/2016      Reactions   Diltiazem Hcl Itching      Medication List    STOP taking these medications   guaiFENesin 600 MG 12 hr tablet Commonly known as:  MUCINEX   ondansetron 4 MG tablet Commonly known as:  ZOFRAN   simvastatin 40 MG tablet Commonly known as:  ZOCOR     TAKE these medications   albuterol 108 (90 Base) MCG/ACT inhaler Commonly known as:  PROVENTIL HFA;VENTOLIN HFA Inhale 2 puffs into the lungs every 6 (six) hours as needed for wheezing or shortness of breath.   atorvastatin 80 MG tablet Commonly known as:  LIPITOR Take 1 tablet (80 mg total) by mouth daily at 6 PM.   clopidogrel 75 MG tablet Commonly known as:  PLAVIX Take 1 tablet (75 mg total) by mouth daily. Start taking on:  09/10/2016   fluticasone 50 MCG/ACT nasal spray Commonly known as:  FLONASE Place 2 sprays into both nostrils daily.   gabapentin 100 MG capsule Commonly known as:  NEURONTIN Take 1 capsule (100 mg total) by mouth daily.   isosorbide mononitrate 60 MG 24 hr tablet Commonly known as:  IMDUR Take 1 tablet (60 mg total) by mouth daily. Start taking on:  09/10/2016   lisinopril 5 MG tablet Commonly known as:  PRINIVIL,ZESTRIL Take 1 tablet (5 mg total) by mouth daily.   loratadine 10 MG tablet Commonly known as:  CLARITIN Take 1 tablet (10 mg total) by mouth daily.   multivitamin tablet Take 1 tablet by mouth daily.   vitamin B-12 1000 MCG tablet Commonly known as:  CYANOCOBALAMIN Take 1 tablet (1,000 mcg total) by mouth daily. What changed:  when to take this   warfarin 3 MG tablet Commonly known as:  COUMADIN Take 1-2 tablets (3-6 mg total) by mouth See  admin instructions. 4.5 mg every day except on Tuesday patient takes 3 mg.       Disposition and follow-up:   DylanDylan Morgan was discharged from [facility] in Stable condition.  At the hospital follow up visit please address:  1.  NSTEMI- assess how he has been tolerating clopidogrel and Imdur, which are new medications started this admission. Check that he has follow up scheduled with cardiology (Dr. Irish Lack)   2.  Labs / imaging needed at time of follow-up: INR   3.  Pending labs/ test needing follow-up: none   Follow-up Appointments: Follow-up Information    Aldine Contes, MD. Call.   Specialty:  Internal Medicine Why:  for a hospital follow up appointment if you have not heard from them by Tuesday. Contact information: [address], [place] [address] [phone]        Jettie Booze, MD. Call.   Specialties:  Cardiology, Radiology, Interventional Cardiology Why:  for follow up appointment and INR check next week. Contact information: [address] [place] [address] [phone]           Hospital Course by problem list:    Non-ST elevation MI (NSTEMI) [facility])   Hx of CABG Dylan Morgan is a 71 yo man with PMH CAD s/p CABG in 2004, thoracic aortic aneurysm, AAA  s/p repair, Afib on warfarin, CVA who presented to the ED via EMS with sudden onset left sided chest pain which awoke him from sleep. The pain was associated with shortness of breath and radiated to his left arm. The pain resolved with sublingual nitroglycerin and aspirin. In the ED initial EKG was unchanged and initial troponin was negative however subsequent troponin was elevated to 0.85 then 2.9. Cardiology was consulted and recommended starting a heparin drip. He went to the cath lab on the second day of admission, this revealed subtotal occlusion of the SVG OM not amendable to opening. HE was started on Plavix and Imdur and discharged with  plans for close follow up with cardiology and internal medicine.     S/P AAA (abdominal aortic aneurysm) repair Thoracic aortic aneurysm CTA was obtained in the ED, revealed thoracic aortic aneurysm 4.6 cm in maximum diameter and significant stenosis of the right subclavian artery. Radiology recommended semi-annual imaging with CTA or MRA and continued monitoring with cardiothoracic surgery.     CKD (chronic kidney disease), stage III Crt at presentation was elevated to 1.6 but then returned to baseline of 1.3.     Hypertension During this admission his blood pressure was well controlled with home medications lisinopril 5 mg daily.     Paroxysmal atrial fibrillation (HCC)   Long term (current) use of anticoagulants Remained in NSR on exams during this admission. Coumadin was held for catheterization. INR 1.3 on the day of discharge when his coumadin was restarted and plans were made for close follow up with coumadin clinic.   Discharge Vitals:   BP 126/68 (BP Location: Right Arm)   Pulse 75   Temp 98 F (36.7 C) (Oral)   Resp (!) 22   Ht 5\' 10"  (1.778 m)   Wt 236 lb 6.4 oz (107.2 kg)   SpO2 100%   BMI 33.92 kg/m   Pertinent Labs, Studies, and Procedures:   Cardiac catheterization (09/08/2016)   Mid LAD lesion, 90 %stenosed.  LIMA and is normal in caliber and anatomically normal.  Mid RCA lesion, 100 %stenosed.  SVG and is normal in caliber.  Dist Cx lesion, 85 %stenosed.  1st Mrg lesion, 100 %stenosed.  SVG.  Prox Graft lesion, 90 %stenosed.  Mid Graft-2 lesion, 95 %stenosed.  Mid Graft-1 lesion, 90 %stenosed.   Severe native CAD with coronary calcification and 90% stenosis in the LAD before the second diagonal vessel; total occlusion of the circumflex marginal vessel with 85% distal circumflex stenosis; and total occlusion of the mid RCA.  The LIMA graft supplying the mid LAD is patent.  The vein graft which had supplied a distal circumflex marginal has  diffuse disease.  There is segmental 90% stenosis in the proximal third of the graft followed by thrombotic stenosis of 90% in the midportion of the graft followed by at least 95% somewhat mobile thrombus in the distal third of the graft with TIMI 1 flow down the graft and faint visualization of the distal marginal vessel that supplies.  I suspect there may have been distal embolization from the multiple thrombotic lesions.  Patent vein graft supplying the PDA of the RCA.  RECOMMENDATION: Due to the extensive thrombus in the vein graft, Aggrastat was started during the procedure. The decision will need to be made concerning continuing anticoagulation with potential attempt at opening up this 71 year old graft versus stopping anticoagulation or antiplatelet therapy and medical therapy with probable resultant graft closure.  The patient  had been on Coumadin prior  to his catheterization procedure due to PAF.   Discharge Instructions: Discharge Instructions    Call MD for:  persistant nausea and vomiting    Complete by:  As directed    Call MD for:  severe uncontrolled pain    Complete by:  As directed    Call MD for:  temperature >100.4    Complete by:  As directed    Diet - low sodium heart healthy    Complete by:  As directed    Discharge instructions    Complete by:  As directed    Mr Carlyon, It was a pleasure taking care of you in the hospital.  We have made some adjustments to your medications based on Cardiology recommendations from your cardiac catheterization. We have added a higher intensity statin called Atorvastatin.  Prescription sent to your pharmacy.  Stop taking Simvastatin once you pick up the Atorvastatin. We have added Imdur for help with your angina. You will now take Plavix 75mg  daily for at least 1 month instead of Aspirin.  After 1 month, a decision about going back to Aspirin or continuing Plavix will be made.   Resume your previous Warfarin dosing as prior to  admission. Please follow up with your Heart doctor and Internal Medicine doctor.   Take Care.   Increase activity slowly    Complete by:  As directed       Signed: Jule Ser, DO 09/09/2016, 2:33 PM   Pager: [phone]

## 2016-09-09 NOTE — Progress Notes (Signed)
Patient ambulated from his room to the nursing station and back. Denis chest pain or shortness of breath.

## 2016-09-10 DIAGNOSIS — I712 Thoracic aortic aneurysm, without rupture, unspecified: Secondary | ICD-10-CM

## 2016-09-11 ENCOUNTER — Encounter (HOSPITAL_COMMUNITY): Payer: Self-pay | Admitting: Cardiovascular Disease

## 2016-09-13 ENCOUNTER — Telehealth: Payer: Self-pay | Admitting: Physician Assistant

## 2016-09-13 NOTE — Telephone Encounter (Signed)
New message       TOC appt on 09-19-16 with Melina Copa and coumadin clinic per Dr Johnsie Cancel

## 2016-09-13 NOTE — Telephone Encounter (Signed)
Outreach made to Pt for TCM follow up s/p hospitalization @  discharged 09/09/2016 Left VM (per DPR) alerting Pt to scheduled appt with Melina Copa 09/19/2016 @ 10:30 am at [place] Left this nurse name and # requesting call back.   Will continue to attempt to contact.

## 2016-09-14 ENCOUNTER — Telehealth: Payer: Self-pay

## 2016-09-14 ENCOUNTER — Telehealth: Payer: Self-pay | Admitting: Interventional Cardiology

## 2016-09-14 NOTE — Telephone Encounter (Signed)
2nd outreach made to Pt.  Call went to VM.  Left detailed message per DPR. Message left regarding discharge from [facility] on 09/09/2016 Notified Pt to follow up with Dayna Dunn 09/19/2016 @ 10:30 am at [facility]. office.  Notified Pt that after his appt he will be seen in the Coumadin clinic. Instructed Pt to bring all his medications to this visit Left this nurse name and number for call back if Pt has any questions.

## 2016-09-14 NOTE — Telephone Encounter (Signed)
Patient calling, states that he received a call from our office. Thanks.

## 2016-09-14 NOTE — Telephone Encounter (Addendum)
Patient returned call regarding discharge from [facility] on 09/09/2016 Patient understands to follow up with provider :  Yes Patient understands discharge instructions? yes Patient understands medications and regiment? yes  Patient understands to bring all medications to this visit? yes  Pt states he has been doing well since discharge.  States he has not had any chest pain or pressure.  Pt states he has been having trouble with a runny nose, thinks it is probably allergies although he has not had allergies before.  Pt also states he has had some trouble with his legs cramping during the night.  States this has been going on for awhile, prior to this admission.  Pt states he has been eating more dill pickles to try to prevent this cramping.  Will follow up with pharmacy to review his meds.   Per pharmacy, statins are usually responsible for muscle cramps.  Will have APP address at follow up appt.

## 2016-09-14 NOTE — Telephone Encounter (Signed)
Created in error

## 2016-09-15 ENCOUNTER — Ambulatory Visit (INDEPENDENT_AMBULATORY_CARE_PROVIDER_SITE_OTHER): Payer: Medicare HMO | Admitting: Internal Medicine

## 2016-09-15 VITALS — BP 131/75 | HR 65 | Temp 97.7°F | Ht 69.0 in | Wt 239.0 lb

## 2016-09-15 DIAGNOSIS — N183 Chronic kidney disease, stage 3 (moderate): Secondary | ICD-10-CM | POA: Diagnosis not present

## 2016-09-15 DIAGNOSIS — Z87891 Personal history of nicotine dependence: Secondary | ICD-10-CM | POA: Diagnosis not present

## 2016-09-15 DIAGNOSIS — Z6835 Body mass index (BMI) 35.0-35.9, adult: Secondary | ICD-10-CM | POA: Diagnosis not present

## 2016-09-15 DIAGNOSIS — I480 Paroxysmal atrial fibrillation: Secondary | ICD-10-CM | POA: Diagnosis not present

## 2016-09-15 DIAGNOSIS — I25119 Atherosclerotic heart disease of native coronary artery with unspecified angina pectoris: Secondary | ICD-10-CM

## 2016-09-15 DIAGNOSIS — Z8673 Personal history of transient ischemic attack (TIA), and cerebral infarction without residual deficits: Secondary | ICD-10-CM | POA: Diagnosis not present

## 2016-09-15 DIAGNOSIS — Z5189 Encounter for other specified aftercare: Secondary | ICD-10-CM | POA: Diagnosis not present

## 2016-09-15 DIAGNOSIS — Z951 Presence of aortocoronary bypass graft: Secondary | ICD-10-CM | POA: Diagnosis not present

## 2016-09-15 DIAGNOSIS — I214 Non-ST elevation (NSTEMI) myocardial infarction: Secondary | ICD-10-CM | POA: Diagnosis not present

## 2016-09-15 DIAGNOSIS — Z7901 Long term (current) use of anticoagulants: Secondary | ICD-10-CM

## 2016-09-15 DIAGNOSIS — E669 Obesity, unspecified: Secondary | ICD-10-CM | POA: Diagnosis not present

## 2016-09-15 DIAGNOSIS — I2510 Atherosclerotic heart disease of native coronary artery without angina pectoris: Secondary | ICD-10-CM | POA: Diagnosis not present

## 2016-09-15 DIAGNOSIS — I120 Hypertensive chronic kidney disease with stage 5 chronic kidney disease or end stage renal disease: Secondary | ICD-10-CM | POA: Diagnosis not present

## 2016-09-15 MED ORDER — RIVAROXABAN 15 MG PO TABS: 15.0000 mg | ORAL_TABLET | Freq: Every day | ORAL | 2 refills | Status: DC

## 2016-09-15 MED ORDER — METOPROLOL SUCCINATE ER 25 MG PO TB24: 12.5000 mg | ORAL_TABLET | Freq: Every day | ORAL | 2 refills | Status: DC

## 2016-09-15 NOTE — Assessment & Plan Note (Addendum)
Paroxysmal atrial fibrillation with CHA2DS2-VASc of 5.  In NSR today.    He has been on warfarin for many years, has stable CKD with GFR ~50  Risks and benefits of DOAC vs VKA discussed.  Interested in trying [place] for lack of monitoring labs.  -discontinue warfarin -Rivaroxaban 15 mg daily

## 2016-09-15 NOTE — Assessment & Plan Note (Addendum)
He was recently admitted June 6 - June 9 for an NSTEMI.  During the admission he had left heart catheterization with coronary angiography about 1 week ago which revealed severe coronary artery disease 90% stenosis LAD and total occlusion of the RCA patent LIMA graft to LAD but severe extensive clot in pain graft to circumflex marginal.  Medical management was recommended, and he was started on Clopidogrel and Imdur.  He was not started on a beta blocker due to history of bradycardia.  After leaving the hospital, he was at his baseline until this morning.  When he first woke up this morning, he had around 5 minutes of dull, substernal chest pressure.  He sat on the edge of his bed and pushed on his chest and symptoms relented.  No dyspnea, diaphoresis, or nausea.  At baseline his walking is limited by dyspnea.  He has accidentally been taking 3x 80 mg atorvastatin daily for the last several months because last time he refilled it he was given 3 bottles.  A/P Obstructive CAD with angina.  -add metoprolol succinate 12.5 mg daily -continue Imdur 60 mg daily -continue clopidogral 75 mg daily -instructed to take only 1x atorvastatin 80 mg daily -cardiac rehab -check CMP today -keep cardiology follow-up next week -monitor for bradycardia, titrate beta blocker and nitrate as needed

## 2016-09-15 NOTE — Patient Instructions (Signed)
For your heart, would like to start you on a low dose beta blocker. Take 12.5 mg (half of a 25 mg pills) of metoprolol once per day.  Only take 1 pill of atorvastatin every day.  For your blood thinning, I would like you to stop taking warfarin and start taking Xarelto 1 pill daily.  Keep your follow-up with cardiology next week.
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
## 2016-09-16 LAB — CMP14 + ANION GAP
ALT: 29 IU/L (ref 0–44)
ANION GAP: 17 mmol/L (ref 10.0–18.0)
AST: 41 IU/L — ABNORMAL HIGH (ref 0–40)
Albumin/Globulin Ratio: 1.4 (ref 1.2–2.2)
Albumin: 4.3 g/dL (ref 3.5–4.8)
Alkaline Phosphatase: 103 IU/L (ref 39–117)
BUN / CREAT RATIO: 16 (ref 10–24)
BUN: 23 mg/dL (ref 8–27)
Bilirubin Total: 0.3 mg/dL (ref 0.0–1.2)
CALCIUM: 9.1 mg/dL (ref 8.6–10.2)
CO2: 19 mmol/L — AB (ref 20–29)
CREATININE: 1.48 mg/dL — AB (ref 0.76–1.27)
Chloride: 105 mmol/L (ref 96–106)
GFR calc non Af Amer: 46 mL/min/{1.73_m2} — ABNORMAL LOW (ref >59)
GFR, EST AFRICAN AMERICAN: 53 mL/min/{1.73_m2} — AB (ref >59)
GLOBULIN, TOTAL: 3 g/dL (ref 1.5–4.5)
Glucose: 87 mg/dL (ref 65–99)
Potassium: 4.4 mmol/L (ref 3.5–5.2)
SODIUM: 141 mmol/L (ref 134–144)
TOTAL PROTEIN: 7.3 g/dL (ref 6.0–8.5)

## 2016-09-18 ENCOUNTER — Encounter: Payer: Self-pay | Admitting: Physician Assistant

## 2016-09-18 NOTE — Progress Notes (Signed)
Internal Medicine Clinic Attending  Case discussed with Dr. O'Sullivan at the time of the visit.  We reviewed the resident's history and exam and pertinent patient test results.  I agree with the assessment, diagnosis, and plan of care documented in the resident's note. 
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
## 2016-09-19 ENCOUNTER — Ambulatory Visit (INDEPENDENT_AMBULATORY_CARE_PROVIDER_SITE_OTHER): Payer: Medicare HMO | Admitting: Physician Assistant

## 2016-09-19 ENCOUNTER — Ambulatory Visit (INDEPENDENT_AMBULATORY_CARE_PROVIDER_SITE_OTHER): Payer: Medicare HMO | Admitting: *Deleted

## 2016-09-19 ENCOUNTER — Encounter: Payer: Self-pay | Admitting: Physician Assistant

## 2016-09-19 VITALS — BP 138/100 | HR 69 | Ht 69.5 in | Wt 239.4 lb

## 2016-09-19 DIAGNOSIS — D649 Anemia, unspecified: Secondary | ICD-10-CM

## 2016-09-19 DIAGNOSIS — I480 Paroxysmal atrial fibrillation: Secondary | ICD-10-CM

## 2016-09-19 DIAGNOSIS — Z5181 Encounter for therapeutic drug level monitoring: Secondary | ICD-10-CM

## 2016-09-19 DIAGNOSIS — I10 Essential (primary) hypertension: Secondary | ICD-10-CM | POA: Diagnosis not present

## 2016-09-19 DIAGNOSIS — I739 Peripheral vascular disease, unspecified: Secondary | ICD-10-CM

## 2016-09-19 DIAGNOSIS — D696 Thrombocytopenia, unspecified: Secondary | ICD-10-CM | POA: Diagnosis not present

## 2016-09-19 DIAGNOSIS — R252 Cramp and spasm: Secondary | ICD-10-CM

## 2016-09-19 DIAGNOSIS — I2510 Atherosclerotic heart disease of native coronary artery without angina pectoris: Secondary | ICD-10-CM

## 2016-09-19 LAB — POCT INR: INR: 2.5

## 2016-09-19 MED ORDER — METOPROLOL SUCCINATE ER 25 MG PO TB24: 25.0000 mg | ORAL_TABLET | Freq: Every day | ORAL | 3 refills | Status: DC

## 2016-09-19 NOTE — Patient Instructions (Signed)
Medication Instructions:  Your physician has recommended you make the following change in your medication: we have increased your metoprolol to 25 nightly    Labwork: Your physician recommends that you return for lab work today for BMET, MAGNESIUM, CBC  Testing/Procedures: None Ordered   Follow-Up:   Any Other Special Instructions Will Be Listed Below (If Applicable).  We have referred you to cardiac rehab     If you need a refill on your cardiac medications before your next appointment, please call your pharmacy.

## 2016-09-20 ENCOUNTER — Telehealth: Payer: Self-pay | Admitting: *Deleted

## 2016-09-20 DIAGNOSIS — R790 Abnormal level of blood mineral: Secondary | ICD-10-CM

## 2016-09-20 LAB — BASIC METABOLIC PANEL
BUN/Creatinine Ratio: 13 (ref 10–24)
BUN: 17 mg/dL (ref 8–27)
CALCIUM: 9.1 mg/dL (ref 8.6–10.2)
CHLORIDE: 104 mmol/L (ref 96–106)
CO2: 18 mmol/L — AB (ref 20–29)
Creatinine, Ser: 1.29 mg/dL — ABNORMAL HIGH (ref 0.76–1.27)
GFR calc Af Amer: 63 mL/min/{1.73_m2} (ref >59)
GFR calc non Af Amer: 55 mL/min/{1.73_m2} — ABNORMAL LOW (ref >59)
GLUCOSE: 100 mg/dL — AB (ref 65–99)
POTASSIUM: 4.3 mmol/L (ref 3.5–5.2)
SODIUM: 139 mmol/L (ref 134–144)

## 2016-09-20 LAB — CBC
HEMATOCRIT: 37.1 % — AB (ref 37.5–51.0)
Hemoglobin: 12.3 g/dL — ABNORMAL LOW (ref 13.0–17.7)
MCH: 28.3 pg (ref 26.6–33.0)
MCHC: 33.2 g/dL (ref 31.5–35.7)
MCV: 85 fL (ref 79–97)
PLATELETS: 244 10*3/uL (ref 150–379)
RBC: 4.35 x10E6/uL (ref 4.14–5.80)
RDW: 15.1 % (ref 12.3–15.4)
WBC: 5.8 10*3/uL (ref 3.4–10.8)

## 2016-09-20 LAB — MAGNESIUM: MAGNESIUM: 1.5 mg/dL — AB (ref 1.6–2.3)

## 2016-09-20 MED ORDER — MAGNESIUM OXIDE 400 MG PO TABS: 400.0000 mg | ORAL_TABLET | Freq: Every day | ORAL | 1 refills | Status: DC

## 2016-09-20 NOTE — Telephone Encounter (Signed)
-----   Message from Dylan Morgan, [place] sent at 09/20/2016  8:08 AM EDT ----- Please let patient know labs are stable except magnesium is low - this could be causing his leg cramps. Advise MagOx 400mg  daily with recheck Mg when he returns for his coumadin clinic visit. Dayna Dunn PA-C

## 2016-09-21 DIAGNOSIS — M19072 Primary osteoarthritis, left ankle and foot: Secondary | ICD-10-CM | POA: Diagnosis not present

## 2016-09-21 DIAGNOSIS — M19012 Primary osteoarthritis, left shoulder: Secondary | ICD-10-CM | POA: Diagnosis not present

## 2016-09-21 DIAGNOSIS — M7542 Impingement syndrome of left shoulder: Secondary | ICD-10-CM | POA: Diagnosis not present

## 2016-09-21 DIAGNOSIS — S46812A Strain of other muscles, fascia and tendons at shoulder and upper arm level, left arm, initial encounter: Secondary | ICD-10-CM | POA: Diagnosis not present

## 2016-09-21 DIAGNOSIS — M25571 Pain in right ankle and joints of right foot: Secondary | ICD-10-CM | POA: Diagnosis not present

## 2016-09-21 DIAGNOSIS — M25572 Pain in left ankle and joints of left foot: Secondary | ICD-10-CM | POA: Diagnosis not present

## 2016-09-21 DIAGNOSIS — M19071 Primary osteoarthritis, right ankle and foot: Secondary | ICD-10-CM | POA: Diagnosis not present

## 2016-09-26 ENCOUNTER — Telehealth: Payer: Self-pay | Admitting: Pharmacist

## 2016-09-26 NOTE — Progress Notes (Signed)
Contacted patient for medication management support. Patient states he is feeling better, was able to pick up his medications and understands how to take them. He is working on establishing with cardiac rehab, provided patient with contact information to schedule an appointment. Advised patient to contact clinic if any concerns arise.

## 2016-09-28 NOTE — Addendum Note (Signed)
Addended by: Hulan Fray on: 09/28/2016 07:56 PM   Modules accepted: Orders

## 2016-10-03 ENCOUNTER — Ambulatory Visit (INDEPENDENT_AMBULATORY_CARE_PROVIDER_SITE_OTHER): Payer: Medicare HMO | Admitting: Pharmacist

## 2016-10-03 ENCOUNTER — Other Ambulatory Visit: Payer: Medicare HMO | Admitting: *Deleted

## 2016-10-03 VITALS — BP 122/82 | HR 61 | Ht 69.0 in | Wt 233.0 lb

## 2016-10-03 DIAGNOSIS — E785 Hyperlipidemia, unspecified: Secondary | ICD-10-CM

## 2016-10-03 DIAGNOSIS — Z5181 Encounter for therapeutic drug level monitoring: Secondary | ICD-10-CM | POA: Diagnosis not present

## 2016-10-03 DIAGNOSIS — R790 Abnormal level of blood mineral: Secondary | ICD-10-CM | POA: Diagnosis not present

## 2016-10-03 DIAGNOSIS — I480 Paroxysmal atrial fibrillation: Secondary | ICD-10-CM

## 2016-10-03 DIAGNOSIS — I10 Essential (primary) hypertension: Secondary | ICD-10-CM | POA: Diagnosis not present

## 2016-10-03 LAB — MAGNESIUM: MAGNESIUM: 1.7 mg/dL (ref 1.6–2.3)

## 2016-10-03 LAB — POCT INR: INR: 3.1

## 2016-10-03 MED ORDER — LISINOPRIL 5 MG PO TABS: 5.0000 mg | ORAL_TABLET | Freq: Every day | ORAL | 3 refills | Status: DC

## 2016-10-03 NOTE — Patient Instructions (Addendum)
It was nice to see you today  Stop taking your simvastatin  Continue taking your other medications  Goal blood pressure is less than 130/80. Your reading was excellent today. Call clinic if your readings are consistently elevated above 140/90
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
## 2016-10-10 ENCOUNTER — Other Ambulatory Visit: Payer: Self-pay | Admitting: Internal Medicine

## 2016-10-17 ENCOUNTER — Other Ambulatory Visit (HOSPITAL_COMMUNITY): Payer: Self-pay | Admitting: *Deleted

## 2016-10-17 DIAGNOSIS — I214 Non-ST elevation (NSTEMI) myocardial infarction: Secondary | ICD-10-CM

## 2016-10-19 ENCOUNTER — Telehealth: Payer: Self-pay | Admitting: Interventional Cardiology

## 2016-10-19 ENCOUNTER — Telehealth (HOSPITAL_COMMUNITY): Payer: Self-pay

## 2016-10-19 NOTE — Telephone Encounter (Signed)
Patient insurance is active and benefits verified. Patient insurance is [employer] - $45.00 co-pay, no deductible, out of pocket $5900/$2424.53 has been met, no co-insurance, no pre-authorization and no limit. Passport/reference #[id number].  ° °

## 2016-10-19 NOTE — Telephone Encounter (Signed)
NEW MESSAGE   *STAT* If patient is at the pharmacy, call can be transferred to refill team.   1. Which medications need to be refilled? (please list name of each medication and dose if known)  METOPROLOL ER 25MG   2. Which pharmacy/location (including street and city if local pharmacy) is medication to be sent to?  [facility]  3. Do they need a 30 day or 90 day supply?  PT DID NOT SAY   PT WOULD LIKE TO BE CALLED WHEN RX HAS BEEN SENT IN SO HE CAN GO PICK IT UP.

## 2016-10-19 NOTE — Telephone Encounter (Signed)
Called pt's pharmacy to inquiry about the pt's medication Metoprolol 25 mg tablet, to make sure that they received the Rx that was sent on 09/19/16 and they did received it, but refilled pt's medication from an old script. Pharmacy cancelled the old script and got the new script to be overrided, so pt can get the correct amount of medication because of the dose change. I called the pt to inform him of the matter and to let him know that his medication has been straighten out and that he can pick up his medication today. I advised the pt that if he has any other problems, questions or concerns to call the office. Pt verbalized understanding.

## 2016-10-23 ENCOUNTER — Telehealth (HOSPITAL_COMMUNITY): Payer: Self-pay

## 2016-10-23 NOTE — Telephone Encounter (Signed)
I called patient to discuss scheduling for cardiac rehab. Patient declined due to financial reasons, patient would have a $45.00 co-pay each time he came to class. I also offered the cardiac rehab maintenance program, this is $68.00 a month and not billed through insurance. Patient declined, not able to pay for either one.  Referral closed.

## 2016-11-03 ENCOUNTER — Ambulatory Visit (INDEPENDENT_AMBULATORY_CARE_PROVIDER_SITE_OTHER): Payer: Medicare HMO

## 2016-11-03 DIAGNOSIS — I480 Paroxysmal atrial fibrillation: Secondary | ICD-10-CM | POA: Diagnosis not present

## 2016-11-03 DIAGNOSIS — Z5181 Encounter for therapeutic drug level monitoring: Secondary | ICD-10-CM | POA: Diagnosis not present

## 2016-11-03 LAB — POCT INR: INR: 2.7

## 2016-11-03 MED ORDER — WARFARIN SODIUM 3 MG PO TABS: ORAL_TABLET | ORAL | 1 refills | Status: DC

## 2016-12-01 ENCOUNTER — Ambulatory Visit (INDEPENDENT_AMBULATORY_CARE_PROVIDER_SITE_OTHER): Payer: Medicare HMO

## 2016-12-01 DIAGNOSIS — I480 Paroxysmal atrial fibrillation: Secondary | ICD-10-CM | POA: Diagnosis not present

## 2016-12-01 DIAGNOSIS — Z5181 Encounter for therapeutic drug level monitoring: Secondary | ICD-10-CM | POA: Diagnosis not present

## 2016-12-01 LAB — POCT INR: INR: 3.6

## 2016-12-22 ENCOUNTER — Ambulatory Visit (INDEPENDENT_AMBULATORY_CARE_PROVIDER_SITE_OTHER): Payer: Medicare HMO | Admitting: *Deleted

## 2016-12-22 DIAGNOSIS — Z5181 Encounter for therapeutic drug level monitoring: Secondary | ICD-10-CM | POA: Diagnosis not present

## 2016-12-22 DIAGNOSIS — I480 Paroxysmal atrial fibrillation: Secondary | ICD-10-CM | POA: Diagnosis not present

## 2016-12-22 LAB — POCT INR: INR: 4.7
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
## 2016-12-28 ENCOUNTER — Encounter (INDEPENDENT_AMBULATORY_CARE_PROVIDER_SITE_OTHER): Payer: Self-pay

## 2016-12-28 ENCOUNTER — Other Ambulatory Visit: Payer: Medicare HMO | Admitting: *Deleted

## 2016-12-28 ENCOUNTER — Encounter: Payer: Self-pay | Admitting: Interventional Cardiology

## 2016-12-28 ENCOUNTER — Ambulatory Visit (INDEPENDENT_AMBULATORY_CARE_PROVIDER_SITE_OTHER): Payer: Medicare HMO | Admitting: *Deleted

## 2016-12-28 ENCOUNTER — Ambulatory Visit (INDEPENDENT_AMBULATORY_CARE_PROVIDER_SITE_OTHER): Payer: Medicare HMO | Admitting: Interventional Cardiology

## 2016-12-28 VITALS — BP 118/70 | HR 58 | Ht 69.0 in | Wt 240.6 lb

## 2016-12-28 DIAGNOSIS — I480 Paroxysmal atrial fibrillation: Secondary | ICD-10-CM

## 2016-12-28 DIAGNOSIS — I5032 Chronic diastolic (congestive) heart failure: Secondary | ICD-10-CM

## 2016-12-28 DIAGNOSIS — E782 Mixed hyperlipidemia: Secondary | ICD-10-CM

## 2016-12-28 DIAGNOSIS — I25119 Atherosclerotic heart disease of native coronary artery with unspecified angina pectoris: Secondary | ICD-10-CM

## 2016-12-28 DIAGNOSIS — Z7901 Long term (current) use of anticoagulants: Secondary | ICD-10-CM

## 2016-12-28 DIAGNOSIS — I10 Essential (primary) hypertension: Secondary | ICD-10-CM

## 2016-12-28 DIAGNOSIS — Z5181 Encounter for therapeutic drug level monitoring: Secondary | ICD-10-CM | POA: Diagnosis not present

## 2016-12-28 DIAGNOSIS — E669 Obesity, unspecified: Secondary | ICD-10-CM | POA: Diagnosis not present

## 2016-12-28 LAB — POCT INR: INR: 2

## 2016-12-28 MED ORDER — POTASSIUM CHLORIDE ER 20 MEQ PO TBCR: 20.0000 meq | EXTENDED_RELEASE_TABLET | Freq: Every day | ORAL | 11 refills | Status: DC

## 2016-12-28 MED ORDER — FUROSEMIDE 40 MG PO TABS: 40.0000 mg | ORAL_TABLET | Freq: Every day | ORAL | 11 refills | Status: DC

## 2016-12-28 NOTE — Patient Instructions (Addendum)
Medication Instructions:  Your physician has recommended you make the following change in your medication:   1. START furosemide (lasix) 40 mg once daily  2. START potassium 20 mEq once daily   Labwork: Your physician recommends that you return for lab work on 01/11/17 for FASTING labs: CMET, LIPIDS  Testing/Procedures: None ordered  Follow-Up: Your physician wants you to follow-up in: 4 months with Dr. Irish Lack. You will receive a reminder letter in the mail two months in advance. If you don't receive a letter, please call our office to schedule the follow-up appointment.   Any Other Special Instructions Will Be Listed Below (If Applicable).     If you need a refill on your cardiac medications before your next appointment, please call your pharmacy.

## 2017-01-01 ENCOUNTER — Other Ambulatory Visit: Payer: Self-pay | Admitting: Internal Medicine

## 2017-01-11 ENCOUNTER — Ambulatory Visit (INDEPENDENT_AMBULATORY_CARE_PROVIDER_SITE_OTHER): Payer: Medicare HMO | Admitting: *Deleted

## 2017-01-11 ENCOUNTER — Other Ambulatory Visit: Payer: Medicare HMO | Admitting: *Deleted

## 2017-01-11 DIAGNOSIS — Z5181 Encounter for therapeutic drug level monitoring: Secondary | ICD-10-CM

## 2017-01-11 DIAGNOSIS — I480 Paroxysmal atrial fibrillation: Secondary | ICD-10-CM | POA: Diagnosis not present

## 2017-01-11 DIAGNOSIS — E782 Mixed hyperlipidemia: Secondary | ICD-10-CM | POA: Diagnosis not present

## 2017-01-11 LAB — LIPID PANEL
CHOLESTEROL TOTAL: 210 mg/dL — AB (ref 100–199)
Chol/HDL Ratio: 6.8 ratio — ABNORMAL HIGH (ref 0.0–5.0)
HDL: 31 mg/dL — ABNORMAL LOW (ref >39)
Triglycerides: 740 mg/dL (ref 0–149)

## 2017-01-11 LAB — COMPREHENSIVE METABOLIC PANEL
ALBUMIN: 4.4 g/dL (ref 3.5–4.8)
ALK PHOS: 106 IU/L (ref 39–117)
ALT: 15 IU/L (ref 0–44)
AST: 25 IU/L (ref 0–40)
Albumin/Globulin Ratio: 1.4 (ref 1.2–2.2)
BILIRUBIN TOTAL: 0.4 mg/dL (ref 0.0–1.2)
BUN / CREAT RATIO: 14 (ref 10–24)
BUN: 25 mg/dL (ref 8–27)
CHLORIDE: 102 mmol/L (ref 96–106)
CO2: 24 mmol/L (ref 20–29)
CREATININE: 1.83 mg/dL — AB (ref 0.76–1.27)
Calcium: 10.4 mg/dL — ABNORMAL HIGH (ref 8.6–10.2)
GFR calc non Af Amer: 36 mL/min/{1.73_m2} — ABNORMAL LOW (ref >59)
GFR, EST AFRICAN AMERICAN: 41 mL/min/{1.73_m2} — AB (ref >59)
GLOBULIN, TOTAL: 3.1 g/dL (ref 1.5–4.5)
Glucose: 109 mg/dL — ABNORMAL HIGH (ref 65–99)
Potassium: 5.6 mmol/L — ABNORMAL HIGH (ref 3.5–5.2)
SODIUM: 142 mmol/L (ref 134–144)
Total Protein: 7.5 g/dL (ref 6.0–8.5)

## 2017-01-11 LAB — POCT INR: INR: 2.8

## 2017-01-12 ENCOUNTER — Telehealth: Payer: Self-pay | Admitting: Interventional Cardiology

## 2017-01-12 DIAGNOSIS — I10 Essential (primary) hypertension: Secondary | ICD-10-CM

## 2017-01-12 DIAGNOSIS — E875 Hyperkalemia: Secondary | ICD-10-CM

## 2017-01-12 NOTE — Telephone Encounter (Signed)
-----   Message from Nuala Alpha, LPN sent at 11/55/2080  8:48 AM EDT -----   ----- Message ----- From: Jettie Booze, MD Sent: 01/12/2017   7:16 AM To: Cleon Gustin, RN, [facility] [address] Triage  Please stop potassium supplement.  Drink plenty of water daily.  Repeat BMet approximately next Wednesday.

## 2017-01-12 NOTE — Telephone Encounter (Signed)
Per Dr. Beau Fanny on 01/12/17, pt is to stop potassium supplement, drink plenty of water daily and to repeat BMET approximately next Wednesday, October 17. I informed the pt of Dr. Irish Lack recommendations and made an appt for pt to come in to repeat BMET. I advised the pt that if he has any other problems, questions or concerns to call the office. Pt verbalized understanding.

## 2017-01-16 ENCOUNTER — Other Ambulatory Visit: Payer: Self-pay | Admitting: Internal Medicine

## 2017-01-17 ENCOUNTER — Telehealth: Payer: Self-pay | Admitting: Interventional Cardiology

## 2017-01-17 ENCOUNTER — Other Ambulatory Visit: Payer: Medicare HMO | Admitting: *Deleted

## 2017-01-17 ENCOUNTER — Other Ambulatory Visit: Payer: Self-pay | Admitting: *Deleted

## 2017-01-17 DIAGNOSIS — I10 Essential (primary) hypertension: Secondary | ICD-10-CM

## 2017-01-17 DIAGNOSIS — E875 Hyperkalemia: Secondary | ICD-10-CM

## 2017-01-17 LAB — BASIC METABOLIC PANEL
BUN/Creatinine Ratio: 15 (ref 10–24)
BUN: 25 mg/dL (ref 8–27)
CALCIUM: 9.3 mg/dL (ref 8.6–10.2)
CO2: 23 mmol/L (ref 20–29)
CREATININE: 1.65 mg/dL — AB (ref 0.76–1.27)
Chloride: 104 mmol/L (ref 96–106)
GFR, EST AFRICAN AMERICAN: 47 mL/min/{1.73_m2} — AB (ref >59)
GFR, EST NON AFRICAN AMERICAN: 41 mL/min/{1.73_m2} — AB (ref >59)
Glucose: 102 mg/dL — ABNORMAL HIGH (ref 65–99)
Potassium: 4.8 mmol/L (ref 3.5–5.2)
Sodium: 139 mmol/L (ref 134–144)

## 2017-01-17 MED ORDER — ATORVASTATIN CALCIUM 80 MG PO TABS: 80.0000 mg | ORAL_TABLET | Freq: Every day | ORAL | 1 refills | Status: DC

## 2017-01-17 NOTE — Telephone Encounter (Signed)
Patient says that the pharmacy will not refill his Clopedogrel 75 mg.  Please call patient regarding this refill.

## 2017-01-17 NOTE — Telephone Encounter (Signed)
Follow up     Pt is returning call , not sure why they called him

## 2017-01-17 NOTE — Telephone Encounter (Signed)
Called pharmacy to verify the issue with the patient's rx. The pharmacy needs a verbal ok from either the PCP or Cardiology Provider about the patient taking both Plavix and Coumadin. Until there is a verbal ok they will not refill the patient's Plavix. Please review. Message sent to both PCP and Cardiology

## 2017-01-17 NOTE — Telephone Encounter (Signed)
Spoke with Darl Householder at the patint's pharmacy and made them aware that we are aware that the patient is on plavix and coumadin. Yao verbalized understanding.

## 2017-01-17 NOTE — Telephone Encounter (Signed)
I supplied one yr refill of plavix today. Has PCP appt later in Oct.

## 2017-01-17 NOTE — Telephone Encounter (Signed)
PCP appt 10/23

## 2017-01-18 ENCOUNTER — Other Ambulatory Visit: Payer: Self-pay

## 2017-01-18 DIAGNOSIS — E782 Mixed hyperlipidemia: Secondary | ICD-10-CM

## 2017-01-18 MED ORDER — FENOFIBRATE 145 MG PO TABS: 145.0000 mg | ORAL_TABLET | Freq: Every day | ORAL | 11 refills | Status: DC

## 2017-01-18 MED ORDER — ATORVASTATIN CALCIUM 80 MG PO TABS: 80.0000 mg | ORAL_TABLET | Freq: Every day | ORAL | 1 refills | Status: DC

## 2017-01-18 NOTE — Telephone Encounter (Signed)
Received another fax from the patient's pharmacy regarding fenofibrate interaction with warfarin. Patient just started on fenofibrate 145 mg QD today in addition to lipitor 80 mg QD for elevated TG. This patient's pharmacy just sent a message yesterday regarding the patient being on both plavix and warfarin.   [place] on [facility] again and spoke to Pharmacist and made her aware that we are aware that the patient is taking plavix, warfarin, fenofibrate, and lipitor and are aware of their potential interactions and side effects and are closely monitoring the patient and to please fill the patient's medicines. Pharmacist verbalizes understanding.

## 2017-01-22 ENCOUNTER — Telehealth: Payer: Self-pay

## 2017-01-22 NOTE — Telephone Encounter (Signed)
Message received from refills that patient wanted to speak to me regarding his lasix Rx. Called and spoke to patient. Patient was started on lasix and potassium on 9/27 at his OV. Potassium was d/c'd on 10/12 d/t hyperkalemia. Patient states that ever since he started taking the lasix on 9/27 that he has been having a bad taste in his mouth, his stomach has been hurting, and that he felt like he had indigestion. Patient states that these symptoms are not associated with meals or any type of activity. Patient denies having any chest pain or any other symptoms. Patient states that he has not taken the lasix for the past 3 days and states that he feels better. Patient denies having any SOB, swelling, weight gain, or any other complaints. He states that he is tolerating his other medicines at this time.

## 2017-01-23 ENCOUNTER — Encounter: Payer: Self-pay | Admitting: Internal Medicine

## 2017-01-23 ENCOUNTER — Ambulatory Visit (INDEPENDENT_AMBULATORY_CARE_PROVIDER_SITE_OTHER): Payer: Medicare HMO | Admitting: Internal Medicine

## 2017-01-23 VITALS — BP 110/60 | HR 70 | Temp 97.8°F | Ht 69.0 in | Wt 241.3 lb

## 2017-01-23 DIAGNOSIS — Z7901 Long term (current) use of anticoagulants: Secondary | ICD-10-CM | POA: Diagnosis not present

## 2017-01-23 DIAGNOSIS — Z23 Encounter for immunization: Secondary | ICD-10-CM

## 2017-01-23 DIAGNOSIS — I129 Hypertensive chronic kidney disease with stage 1 through stage 4 chronic kidney disease, or unspecified chronic kidney disease: Secondary | ICD-10-CM | POA: Diagnosis not present

## 2017-01-23 DIAGNOSIS — I2510 Atherosclerotic heart disease of native coronary artery without angina pectoris: Secondary | ICD-10-CM

## 2017-01-23 DIAGNOSIS — I480 Paroxysmal atrial fibrillation: Secondary | ICD-10-CM | POA: Diagnosis not present

## 2017-01-23 DIAGNOSIS — I712 Thoracic aortic aneurysm, without rupture, unspecified: Secondary | ICD-10-CM

## 2017-01-23 DIAGNOSIS — Z951 Presence of aortocoronary bypass graft: Secondary | ICD-10-CM | POA: Diagnosis not present

## 2017-01-23 DIAGNOSIS — I714 Abdominal aortic aneurysm, without rupture, unspecified: Secondary | ICD-10-CM

## 2017-01-23 DIAGNOSIS — I10 Essential (primary) hypertension: Secondary | ICD-10-CM

## 2017-01-23 DIAGNOSIS — G5603 Carpal tunnel syndrome, bilateral upper limbs: Secondary | ICD-10-CM | POA: Diagnosis not present

## 2017-01-23 DIAGNOSIS — N183 Chronic kidney disease, stage 3 unspecified: Secondary | ICD-10-CM

## 2017-01-23 DIAGNOSIS — R7303 Prediabetes: Secondary | ICD-10-CM

## 2017-01-23 DIAGNOSIS — I25119 Atherosclerotic heart disease of native coronary artery with unspecified angina pectoris: Secondary | ICD-10-CM

## 2017-01-23 DIAGNOSIS — Z79899 Other long term (current) drug therapy: Secondary | ICD-10-CM | POA: Diagnosis not present

## 2017-01-23 DIAGNOSIS — Z0000 Encounter for general adult medical examination without abnormal findings: Secondary | ICD-10-CM

## 2017-01-23 LAB — GLUCOSE, CAPILLARY: GLUCOSE-CAPILLARY: 117 mg/dL — AB (ref 65–99)

## 2017-01-23 LAB — POCT GLYCOSYLATED HEMOGLOBIN (HGB A1C): HEMOGLOBIN A1C: 5.5

## 2017-01-23 MED ORDER — METOPROLOL SUCCINATE ER 25 MG PO TB24: 25.0000 mg | ORAL_TABLET | Freq: Every day | ORAL | 3 refills | Status: DC

## 2017-01-23 MED ORDER — TORSEMIDE 20 MG PO TABS: 20.0000 mg | ORAL_TABLET | Freq: Every day | ORAL | 11 refills | Status: DC

## 2017-01-23 NOTE — Telephone Encounter (Signed)
Follow Up:    Pt says he wants something else to take for his fluid instead of Furosemide please.Please call this new medicine in to [facility].

## 2017-01-23 NOTE — Patient Instructions (Signed)
-   It was a pleasure seeing you today - We will refer you to cardiothoracic surgery for follow up of your aneurysm - I have refilled your metoprolol today - We will check an A1C - I will refer you on your next appointment for a colonoscopy - Have a great holiday season!!

## 2017-01-23 NOTE — Telephone Encounter (Signed)
Returned call to patient. Patient wanting to be prescribed a different diuretic in place of lasix d/t symptoms listed below. Patient is 4 days now without taking lasix and denies having any SOB, swelling, or weight gain. Will forward to Dr. Irish Lack for review and recommendation.

## 2017-01-23 NOTE — Telephone Encounter (Signed)
Called and left a detailed message on patient's VM (DPR on file) letting patient know that Dr. Irish Lack would like for him to start torsemide 20 mg QD in place of his lasix. Made patient aware that the Rx has been sent to his preferred pharmacy. Instructed for patient to call back with any questions or concerns.

## 2017-01-24 NOTE — Assessment & Plan Note (Signed)
BP Readings from Last 3 Encounters:  01/23/17 110/60  12/28/16 118/70  10/03/16 122/82    Lab Results  Component Value Date   NA 139 01/17/2017   K 4.8 01/17/2017   CREATININE 1.65 (H) 01/17/2017    Assessment: Blood pressure control:  Well-controlled Progress toward BP goal:   At goal Comments: Patient is compliant with Toprol-XL 25 mg, lisinopril 5 mg, Imdur 60 mg  Plan: Medications:  continue current medications Educational resources provided: brochure (denies need ) Self management tools provided:   Other plans:

## 2017-01-24 NOTE — Assessment & Plan Note (Signed)
-  Patient had a AAA status post repair -He follows with vascular surgery annually and followed up with them in May 2018 -AAA was reported to be stable on imaging. -Patient to follow-up with vascular surgery next year

## 2017-01-24 NOTE — Assessment & Plan Note (Signed)
-   Patient was noted to have thoracic aortic aneurysm of 4.6 cm on the CTA done in June 2018. -Patient was supposed to follow-up with cardiothoracic surgery for this and is supposed to have repeat imaging done in December 2018 -He denies any chest pain, no shortness of breath, no lightheadedness, no syncope -We will refer him to cardio thoracic surgery today for follow-up of this

## 2017-01-24 NOTE — Assessment & Plan Note (Signed)
-  This problem is chronic and stable -Patient had a recent BMP done on January 17, 2017 -His creatinine is at his baseline approximately 1.6 -We will continue to monitor closely

## 2017-01-24 NOTE — Assessment & Plan Note (Addendum)
-  Patient received a flu shot today -Patient is to be referred for colonoscopy and is agreeable to this.  We will do this on his next visit after he visits with the cardiothoracic surgeon for his thoracic aortic aneurysm

## 2017-01-24 NOTE — Assessment & Plan Note (Signed)
-  Patient has a history of paroxysmal A. fib but appears to be in a regular rhythm on exam today -This problem is chronic and stable -We will continue with warfarin per Coumadin clinic -Continue with Toprol-XL for rate control -No further workup for now

## 2017-01-24 NOTE — Assessment & Plan Note (Signed)
-  This problem is chronic and stable -Patient has history of CAD status post CABG and follows with cardiology for this -We will continue with Toprol-XL, Imdur, clopidogrel, atorvastatin 80 mg -Patient will follow up with Dr. Irish Lack as an outpatient

## 2017-01-24 NOTE — Progress Notes (Signed)
   Subjective:    Patient ID: Dylan Morgan, male    DOB: Apr 12, 1945, 71 y.o.   MRN: [id number]  HPI  I have seen and examined this patient.  Patient is here for routine follow-up of his prediabetes and paroxysmal A. fib.  Patient states that he feels well and is compliant with his medications and has no new complaints at this time.  Patient was noted on recent imaging to have a thoracic aortic aneurysm and was advised to have repeat imaging every 6 months (December 2018) and follow-up with cardiothoracic surgery.  Patient states that he has yet to see a cardiothoracic surgeon and has not been referred there.  Review of Systems  Constitutional: Negative.   HENT: Negative.   Respiratory: Negative.   Cardiovascular: Negative.   Gastrointestinal: Negative.   Musculoskeletal: Negative.   Neurological: Negative.   Psychiatric/Behavioral: Negative.        Objective:   Physical Exam  Constitutional: He is oriented to person, place, and time. He appears well-developed and well-nourished.  HENT:  Head: Normocephalic and atraumatic.  Mouth/Throat: No oropharyngeal exudate.  Neck: Normal range of motion. Neck supple.  Cardiovascular: Normal rate, regular rhythm and normal heart sounds.   Pulmonary/Chest: Effort normal and breath sounds normal. No respiratory distress. He has no wheezes.  Abdominal: Soft. Bowel sounds are normal. He exhibits no distension. There is no tenderness.  Musculoskeletal: Normal range of motion. He exhibits no edema.  Lymphadenopathy:    He has no cervical adenopathy.  Neurological: He is alert and oriented to person, place, and time.  Skin: Skin is warm. No rash noted.  Psychiatric: He has a normal mood and affect. His behavior is normal.          Assessment & Plan:  Please see problem based charting for assessment and plan:

## 2017-01-24 NOTE — Assessment & Plan Note (Signed)
-  Patient is not on any diabetic medications -We will monitor his A1c annually -A1c done today was 5.5 -Patient educated about the importance of weight loss -We will recheck his A1c next year

## 2017-01-24 NOTE — Assessment & Plan Note (Signed)
-  This problem is chronic and stable -Patient states that the weakness in his wrists have resolved and he is not dropping things anymore -Patient also denies tingling or numbness in his fingers -He used to wear splints at night for possible carpal tunnel syndrome but states he has stopped doing this and has no residual symptoms -We will continue to monitor off splints for now

## 2017-01-31 ENCOUNTER — Other Ambulatory Visit: Payer: Self-pay | Admitting: *Deleted

## 2017-01-31 MED ORDER — WARFARIN SODIUM 3 MG PO TABS: ORAL_TABLET | ORAL | 1 refills | Status: DC

## 2017-02-05 ENCOUNTER — Ambulatory Visit (INDEPENDENT_AMBULATORY_CARE_PROVIDER_SITE_OTHER): Payer: Medicare HMO | Admitting: Pharmacist

## 2017-02-05 DIAGNOSIS — I480 Paroxysmal atrial fibrillation: Secondary | ICD-10-CM | POA: Diagnosis not present

## 2017-02-05 DIAGNOSIS — Z5181 Encounter for therapeutic drug level monitoring: Secondary | ICD-10-CM

## 2017-02-05 LAB — PROTIME-INR
INR: 2.3 — ABNORMAL HIGH (ref 0.8–1.2)
Prothrombin Time: 24.8 s — ABNORMAL HIGH (ref 9.1–12.0)

## 2017-02-21 ENCOUNTER — Encounter: Payer: Medicare HMO | Admitting: Surgery

## 2017-03-02 ENCOUNTER — Ambulatory Visit (INDEPENDENT_AMBULATORY_CARE_PROVIDER_SITE_OTHER): Payer: Medicare HMO | Admitting: *Deleted

## 2017-03-02 DIAGNOSIS — Z5181 Encounter for therapeutic drug level monitoring: Secondary | ICD-10-CM | POA: Diagnosis not present

## 2017-03-02 DIAGNOSIS — I480 Paroxysmal atrial fibrillation: Secondary | ICD-10-CM | POA: Diagnosis not present

## 2017-03-02 LAB — POCT INR: INR: 5.5

## 2017-03-02 NOTE — Patient Instructions (Signed)
Skip today and tomorrow's dose, then Continue taking 1 pill everyday except 2 pills on Mondays and Fridays. Recheck in 7-10 days.

## 2017-03-09 ENCOUNTER — Ambulatory Visit (INDEPENDENT_AMBULATORY_CARE_PROVIDER_SITE_OTHER): Payer: Medicare HMO | Admitting: *Deleted

## 2017-03-09 DIAGNOSIS — I480 Paroxysmal atrial fibrillation: Secondary | ICD-10-CM

## 2017-03-09 DIAGNOSIS — Z5181 Encounter for therapeutic drug level monitoring: Secondary | ICD-10-CM

## 2017-03-09 LAB — POCT INR: INR: 2.3

## 2017-03-09 NOTE — Patient Instructions (Signed)
Continue taking 1 pill everyday except 2 pills on Mondays and Fridays. Recheck in 3 weeks.

## 2017-03-14 ENCOUNTER — Other Ambulatory Visit: Payer: Self-pay

## 2017-03-14 ENCOUNTER — Institutional Professional Consult (permissible substitution) (INDEPENDENT_AMBULATORY_CARE_PROVIDER_SITE_OTHER): Payer: Medicare HMO | Admitting: Surgery

## 2017-03-14 ENCOUNTER — Encounter: Payer: Self-pay | Admitting: Surgery

## 2017-03-14 VITALS — BP 140/86 | HR 82 | Resp 20 | Ht 69.0 in | Wt 238.0 lb

## 2017-03-14 DIAGNOSIS — I712 Thoracic aortic aneurysm, without rupture, unspecified: Secondary | ICD-10-CM

## 2017-03-14 NOTE — Progress Notes (Signed)
PCP is Aldine Contes, MD Referring Provider is Aldine Contes, MD  Chief Complaint  Patient presents with  . New Patient (Initial Visit)    Thoracic aortic aneurysm     HPI:  The patient is a 71 year old gentleman with a history of hypertension, hyperlipidemia, paroxysmal atrial fibrillation on Coumadin, prior stroke with known left internal carotid artery occlusion, stage III chronic kidney disease, abdominal aortic aneurysm status post EVAR in 2010, and coronary artery disease status post CABG in 2004.  The patient had a non-ST segment elevation MI in June 2018 and underwent cardiac catheterization showing severe native three-vessel coronary disease and severe vein graft disease in the graft to an obtuse marginal branch.  He has been treated medically and continued on Coumadin.  He denies any chest pain or pressure.  He had a CT scan of the chest, abdomen, and pelvis when he presented in June and this showed a 4.6 cm fusiform ascending aneurysm.  There was no aortic dissection.  Past Medical History:  Diagnosis Date  . AAA (abdominal aortic aneurysm) ([place])    a. 2010 s/p stenting  . Anemia   . Arthritis    "pretty much all over" (09/06/2016)  . Blind left eye    "explosion knocked hole in retina" (09/06/2016)  . Bradycardia   . CAD (coronary artery disease) CABG in 2004   a. 2004 s/p CABG. b. NSTEMI 09/2016 -> cath with occ VG-distal Cx, managed medically.  . Carotid artery disease ([place])    a. 2012 dopplers with old LICA occlusion , RICA no significant abnormality  . Chronic lower back pain   . CKD (chronic kidney disease), stage III ([place])   . CVA (cerebral vascular accident) [facility])    a. 2013 R Carona radiata stroke   . GERD (gastroesophageal reflux disease)   . History of blood transfusion 2004   "related to OHS"  . Hyperlipidemia   . Hypertension   . Paroxysmal atrial fibrillation ([place]) 02/22/2009   a. on Coumadin   . Pre-diabetes   . PVD (peripheral vascular  disease) ([place])    a. ?R subclavian stenosis by CT 09/2016.  Marland Kitchen Seborrheic dermatitis of scalp   . Thrombocytopenia ([place])   . Tobacco abuse     Past Surgical History:  Procedure Laterality Date  . ABDOMINAL AORTIC ANEURYSM REPAIR  2010   Aortic stent repair  . CATARACT EXTRACTION W/ INTRAOCULAR LENS  IMPLANT, BILATERAL    . CORONARY ARTERY BYPASS GRAFT  2004   "CABG X3"  . LEFT HEART CATH AND CORS/GRAFTS ANGIOGRAPHY N/A 09/08/2016   Procedure: Left Heart Cath and Cors/Grafts Angiography;  Surgeon: Troy Sine, MD;  Location: [facility];  Service: Cardiovascular;  Laterality: N/A;  . SHOULDER OPEN ROTATOR CUFF REPAIR Left 1990    Family History  Problem Relation Age of Onset  . Diabetes Mother   . Stroke Father   . Thyroid cancer Sister   . Heart disease Brother        Coronary artery disease  . Heart attack Brother   . Heart attack Brother     Social History Social History   Tobacco Use  . Smoking status: Former Smoker    Packs/day: 3.00    Years: 51.00    Pack years: 153.00    Types: Cigarettes    Last attempt to quit: 02/03/2010    Years since quitting: 7.1  . Smokeless tobacco: Never Used  . Tobacco comment: "chewed tobacco 1-2 times'  Substance Use Topics  . Alcohol use: Yes    Alcohol/week: 0.0 oz    Comment: 09/06/2016 "nothing in a long while; used to drink q now and then"  . Drug use: Yes    Types: Marijuana    Comment: 09/06/2016 "stopped in 2002"    Current Outpatient Medications  Medication Sig Dispense Refill  . atorvastatin (LIPITOR) 80 MG tablet Take 1 tablet (80 mg total) by mouth daily at 6 PM. 90 tablet 1  . clopidogrel (PLAVIX) 75 MG tablet TAKE 1 TABLET BY MOUTH ONCE DAILY 90 tablet 3  . fenofibrate (TRICOR) 145 MG tablet Take 1 tablet (145 mg total) by mouth daily. 30 tablet 11  . fluticasone (FLONASE) 50 MCG/ACT nasal spray Place 2 sprays into both nostrils daily. 16 g 2  . isosorbide mononitrate (IMDUR) 60 MG 24 hr tablet TAKE 1 TABLET  BY MOUTH ONCE DAILY 90 tablet 0  . lisinopril (PRINIVIL,ZESTRIL) 5 MG tablet Take 1 tablet (5 mg total) by mouth daily. 90 tablet 3  . loratadine (CLARITIN) 10 MG tablet Take 1 tablet (10 mg total) by mouth daily. 30 tablet 2  . magnesium oxide (MAG-OX) 400 MG tablet Take 1 tablet (400 mg total) by mouth daily. 90 tablet 1  . metoprolol succinate (TOPROL-XL) 25 MG 24 hr tablet Take 1 tablet (25 mg total) by mouth daily. 90 tablet 3  . Multiple Vitamin (MULTIVITAMIN) tablet Take 1 tablet by mouth daily. 90 tablet 3  . torsemide (DEMADEX) 20 MG tablet Take 1 tablet (20 mg total) by mouth daily. 30 tablet 11  . vitamin B-12 (CYANOCOBALAMIN) 1000 MCG tablet Take 1 tablet (1,000 mcg total) by mouth daily. 90 tablet 0  . warfarin (COUMADIN) 3 MG tablet Take as directed by the coumadin clinic 40 tablet 1   No current facility-administered medications for this visit.     Allergies  Allergen Reactions  . Diltiazem Hcl Itching    Review of Systems  Constitutional: Positive for fatigue.  HENT: Positive for hearing loss.        Dentures  Eyes: Negative.   Respiratory: Positive for cough and wheezing.   Cardiovascular: Negative for chest pain, palpitations and leg swelling.  Gastrointestinal: Negative.   Endocrine: Negative.   Genitourinary: Negative.   Musculoskeletal: Positive for gait problem.  Skin: Negative.   Allergic/Immunologic: Negative.   Neurological:       Prior stroke  Neuropathy in feet   Hematological: Negative.   Psychiatric/Behavioral: Negative.     BP 140/86   Pulse 82   Resp 20   Ht 5\' 9"  (1.753 m)   Wt 238 lb (108 kg)   SpO2 98% Comment: on RA  BMI 35.15 kg/m  Physical Exam  Constitutional: He is oriented to person, place, and time. No distress.  Obese gentleman  HENT:  Head: Normocephalic and atraumatic.  Mouth/Throat: Oropharynx is clear and moist.  Eyes: Conjunctivae and EOM are normal. Pupils are equal, round, and reactive to light.  Neck: Normal  range of motion. Neck supple. No JVD present. No thyromegaly present.  Cardiovascular: Normal rate, regular rhythm and normal heart sounds.  No murmur heard. Radial pulse weaker on right than left  Pulmonary/Chest: Effort normal and breath sounds normal. No respiratory distress.  Abdominal: Soft. Bowel sounds are normal. He exhibits no distension. There is no tenderness.  Musculoskeletal: Normal range of motion. He exhibits no edema.  Lymphadenopathy:    He has no cervical adenopathy.  Neurological: He is alert and oriented  to person, place, and time. He has normal strength.  Skin: Skin is warm and dry.  Psychiatric: He has a normal mood and affect.     Diagnostic Tests:  CLINICAL DATA:  Chest and abdominal pain. History of prior EVAR in 2010 to treat abdominal aortic aneurysm  EXAM: CT ANGIOGRAPHY CHEST, ABDOMEN AND PELVIS  TECHNIQUE: Multidetector CT imaging through the chest, abdomen and pelvis was performed using the standard protocol during bolus administration of intravenous contrast. Multiplanar reconstructed images and MIPs were obtained and reviewed to evaluate the vascular anatomy.  CONTRAST:  80 mL Isovue 370 IV  COMPARISON:  Prior CTA of the abdomen and pelvis on 08/10/2016 as well as multiple prior imaging studies post EVAR. Prior CT of the chest without contrast on 12/15/2015.  FINDINGS: CTA CHEST FINDINGS  Cardiovascular: The thoracic aorta is well opacified and shows no evidence of acute dissection. There is aneurysmal disease of the ascending thoracic aorta which measures 4.6 cm in greatest diameter. The aorta measures approximately 4.2 cm at the sinuses of Valsalva. Aortic arch and descending thoracic aorta are normal in caliber. Calcified and noncalcified plaque at the origin of the right subclavian artery causes significant stenosis of approximately 70- 75% narrowing with poststenotic dilatation present. Mild plaque is present in the proximal  innominate artery, proximal left common carotid artery and proximal left subclavian artery without significant stenosis. Pulmonary arterial opacification is suboptimal at the timing of the study. There is evidence of prior CABG. The heart size is stable. No pericardial fluid.  Mediastinum/Nodes: No enlarged mediastinal, hilar, or axillary lymph nodes. Thyroid gland, trachea, and esophagus demonstrate no significant findings.  Lungs/Pleura: There is no evidence of pulmonary edema, consolidation, pneumothorax, nodule or pleural fluid.  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  Review of the MIP images confirms the above findings.  CTA ABDOMEN AND PELVIS FINDINGS  VASCULAR  Aorta: Stable positioning of aortic endograft since the prior study. There remains evidence of slight distal migration of the proximal end of the endograft, especially along its left infrarenal margin. There is no associated type 1 endoleak. Aneurysm sac is stable measuring 4.7 x 5.3 cm and demonstrating no evidence of type 2 endoleak. Distal common iliac limbs show stable positioning and patency.  Since aneurysm repair, there is some gradual increase caliber of the proximal and mid abdominal aorta superior to the endograft. The juxtarenal aorta measures 4.1 cm in maximum diameter and measured 3.5 cm at a comparable level prior to aneurysm repair. The aorta measures 4 cm at the level of the SMA origin.  Celiac: Stable moderate stenosis, 50- 60%.  SMA: Stable stenosis, 40- 50%.  Renals: Stable patency of single right and 2 separate left renal arteries without significant stenosis.  IMA: Occluded origin.  Distal branches reconstituted.  Inflow: Native iliac arteries show stable patency. There is stable mild aneurysmal dilatation of the proximal right internal iliac artery trunk measuring 13 mm. Common femoral arteries and femoral bifurcations show  normal patency.  Review of the MIP images confirms the above findings.  NON-VASCULAR  Hepatobiliary: No focal liver abnormality is seen. No gallstones, gallbladder wall thickening, or biliary dilatation.  Pancreas: Unremarkable. No pancreatic ductal dilatation or surrounding inflammatory changes.  Spleen: Normal in size without focal abnormality.  Adrenals/Urinary Tract: Adrenal glands are unremarkable. Kidneys are normal, without renal calculi, focal lesion, or hydronephrosis. Bladder is unremarkable.  Stomach/Bowel: No abnormality identified involving bowel. No free air. No abnormal fluid collections.  Lymphatic: No enlarged  lymph nodes identified.  Reproductive: Prostate is unremarkable.  Other: No abdominal wall hernia or abnormality. No abdominopelvic ascites.  Musculoskeletal: Lumbar degenerative disc disease present with associated mild leftward convex scoliosis.  Review of the MIP images confirms the above findings.  IMPRESSION: 1. Aneurysmal disease of the ascending thoracic aorta measuring up to 4.6 cm in maximum diameter. No thoracic aortic dissection identified. Recommend semi-annual imaging followup by CTA or MRA and referral to cardiothoracic surgery if not already obtained. This recommendation follows 2010 ACCF/AHA/AATS/ACR/ASA/SCA/SCAI/SIR/STS/SVM Guidelines for the Diagnosis and Management of Patients With Thoracic Aortic Disease. Circulation. 2010; 121: e266-e36 2. Significant stenosis at the origin of the right subclavian artery approaching 70- 75% in estimated caliber. Recommend correlation with discrepancy in blood pressure measurement in the upper extremities and with any symptoms of right upper extremity claudication. 3. Stable CTA of the abdomen and pelvis since recent imaging last month. There remains evidence of slight distal migration of the proximal endograft since placement without evidence of endoleak. Aneurysmal disease of  the native aorta superior to the endograft does show slow progression since 2010 with enlargement of the juxtarenal and proximal abdominal aorta up to 4.1 cm in maximum diameter. No evidence of abdominal aortic dissection.   Electronically Signed   By: Aletta Edouard M.D.   On: 09/06/2016 10:09   Impression:  This 71 year old gentleman with multiple comorbidities has a 4.6 cm fusiform ascending aortic aneurysm noted incidentally on CT scan in June 2018.  This is well below the surgical threshold of 5.5 cm.  His blood pressure is under reasonable control.  Stressed the importance of that with him.  I reviewed the CT images with him and answered his questions.  He should have a follow-up scan in 1 year and I think this can be done without contrast since he has stage III kidney disease.  Plan:  I will see him back in 1 year with a CT scan of the chest without contrast.   I spent 30 minutes performing this consultation and > 50% of this time was spent face to face counseling and coordinating the care of this patient's ascending aortic aneurysm.   Gaye Pollack, MD [facility] [phone]

## 2017-03-28 ENCOUNTER — Other Ambulatory Visit: Payer: Self-pay | Admitting: Internal Medicine

## 2017-03-28 ENCOUNTER — Telehealth: Payer: Self-pay | Admitting: Interventional Cardiology

## 2017-03-28 ENCOUNTER — Other Ambulatory Visit: Payer: Self-pay | Admitting: Physician Assistant

## 2017-03-28 DIAGNOSIS — E782 Mixed hyperlipidemia: Secondary | ICD-10-CM

## 2017-03-28 NOTE — Telephone Encounter (Signed)
New message     Patients states that Dr Meda Coffee nurse called him to set up a appt ?  I could not find any notes

## 2017-03-28 NOTE — Telephone Encounter (Signed)
Patient needed lab appointment. Appointment made on 12/28.

## 2017-03-30 ENCOUNTER — Other Ambulatory Visit: Payer: Medicare HMO | Admitting: *Deleted

## 2017-03-30 ENCOUNTER — Ambulatory Visit (INDEPENDENT_AMBULATORY_CARE_PROVIDER_SITE_OTHER): Payer: Medicare HMO | Admitting: *Deleted

## 2017-03-30 ENCOUNTER — Telehealth: Payer: Self-pay

## 2017-03-30 DIAGNOSIS — E782 Mixed hyperlipidemia: Secondary | ICD-10-CM

## 2017-03-30 DIAGNOSIS — I480 Paroxysmal atrial fibrillation: Secondary | ICD-10-CM

## 2017-03-30 DIAGNOSIS — Z5181 Encounter for therapeutic drug level monitoring: Secondary | ICD-10-CM

## 2017-03-30 DIAGNOSIS — I10 Essential (primary) hypertension: Secondary | ICD-10-CM

## 2017-03-30 LAB — COMPREHENSIVE METABOLIC PANEL
ALK PHOS: 67 IU/L (ref 39–117)
ALT: 19 IU/L (ref 0–44)
AST: 26 IU/L (ref 0–40)
Albumin/Globulin Ratio: 1.4 (ref 1.2–2.2)
Albumin: 4.3 g/dL (ref 3.5–4.8)
BILIRUBIN TOTAL: 0.4 mg/dL (ref 0.0–1.2)
BUN / CREAT RATIO: 13 (ref 10–24)
BUN: 26 mg/dL (ref 8–27)
CHLORIDE: 104 mmol/L (ref 96–106)
CO2: 22 mmol/L (ref 20–29)
Calcium: 9.6 mg/dL (ref 8.6–10.2)
Creatinine, Ser: 1.99 mg/dL — ABNORMAL HIGH (ref 0.76–1.27)
GFR calc Af Amer: 37 mL/min/{1.73_m2} — ABNORMAL LOW (ref >59)
GFR calc non Af Amer: 32 mL/min/{1.73_m2} — ABNORMAL LOW (ref >59)
GLUCOSE: 105 mg/dL — AB (ref 65–99)
Globulin, Total: 3 g/dL (ref 1.5–4.5)
Potassium: 5.1 mmol/L (ref 3.5–5.2)
Sodium: 141 mmol/L (ref 134–144)
Total Protein: 7.3 g/dL (ref 6.0–8.5)

## 2017-03-30 LAB — LDL CHOLESTEROL, DIRECT: LDL Direct: 58 mg/dL (ref 0–99)

## 2017-03-30 LAB — LIPID PANEL
CHOL/HDL RATIO: 3.1 ratio (ref 0.0–5.0)
Cholesterol, Total: 112 mg/dL (ref 100–199)
HDL: 36 mg/dL — AB (ref >39)
LDL CALC: 39 mg/dL (ref 0–99)
Triglycerides: 187 mg/dL — ABNORMAL HIGH (ref 0–149)
VLDL CHOLESTEROL CAL: 37 mg/dL (ref 5–40)

## 2017-03-30 LAB — POCT INR: INR: 4.8

## 2017-03-30 NOTE — Telephone Encounter (Signed)
Patient called and made aware of results and recommendations to stay hydrated. Repeat BMET scheduled for 04/13/17 on same day as coumadin appointment. Patient verbalized understanding and thanked me for the call.

## 2017-03-30 NOTE — Telephone Encounter (Signed)
-----   Message from Jettie Booze, MD sent at 03/30/2017  4:56 PM EST ----- Cr increased.  Stay well hydrated.  WOuld recheck BMet  in 1-2 weeks.

## 2017-03-30 NOTE — Patient Instructions (Signed)
Description   Do not take any Coumadin today and No Coumadin tomorrow then continue taking 1 pill everyday except 2 pills on Mondays and Fridays. Recheck in 2 weeks.

## 2017-04-04 ENCOUNTER — Encounter: Payer: Self-pay | Admitting: Internal Medicine

## 2017-04-04 ENCOUNTER — Ambulatory Visit (INDEPENDENT_AMBULATORY_CARE_PROVIDER_SITE_OTHER): Payer: Medicare HMO | Admitting: Internal Medicine

## 2017-04-04 VITALS — BP 146/82 | HR 81 | Temp 98.2°F | Wt 234.6 lb

## 2017-04-04 DIAGNOSIS — H6092 Unspecified otitis externa, left ear: Secondary | ICD-10-CM | POA: Diagnosis not present

## 2017-04-04 DIAGNOSIS — H60502 Unspecified acute noninfective otitis externa, left ear: Secondary | ICD-10-CM

## 2017-04-04 DIAGNOSIS — J069 Acute upper respiratory infection, unspecified: Secondary | ICD-10-CM | POA: Diagnosis not present

## 2017-04-04 MED ORDER — CIPROFLOXACIN-HYDROCORTISONE 0.2-1 % OT SUSP: 3.0000 [drp] | Freq: Two times a day (BID) | OTIC | 0 refills | Status: AC

## 2017-04-04 NOTE — Patient Instructions (Addendum)
Nice to meet you today [name].  I sent some eardrops to the pharmacy to help with your ear infection.  Put 3 drops into your ear twice a day for 7 days.  If the redness and swelling continues to get worse and spread from your ear, please call the clinic to make another appointment or have it evaluated.  You can also take Tylenol to help with the pain.

## 2017-04-04 NOTE — Progress Notes (Signed)
   CC: L Ear pain   HPI:  [name] is a 72 y.o. male with past medical history as described below who presents to the clinic with complaints of ear pain.   L Ear Pain: Patient notes viral URI symptoms 4-5 days ago consisting of rhinorrhea, cough, throat irritation which was improving with over-the-counter medications.  However, yesterday he developed left ear pain which has worsened since onset.  Worsens with touching and moving the air.  Also notes associated redness, swelling.  Denies hearing changes, drainage, injury to ear, fever, shortness of breath.   Past Medical History:  Diagnosis Date  . AAA (abdominal aortic aneurysm) ([place])    a. 2010 s/p stenting  . Anemia   . Arthritis    "pretty much all over" (09/06/2016)  . Blind left eye    "explosion knocked hole in retina" (09/06/2016)  . Bradycardia   . CAD (coronary artery disease) CABG in 2004   a. 2004 s/p CABG. b. NSTEMI 09/2016 -> cath with occ VG-distal Cx, managed medically.  . Carotid artery disease ([place])    a. 2012 dopplers with old LICA occlusion , RICA no significant abnormality  . Chronic lower back pain   . CKD (chronic kidney disease), stage III ([place])   . CVA (cerebral vascular accident) [facility])    a. 2013 R Carona radiata stroke   . GERD (gastroesophageal reflux disease)   . History of blood transfusion 2004   "related to OHS"  . Hyperlipidemia   . Hypertension   . Paroxysmal atrial fibrillation ([place]) 02/22/2009   a. on Coumadin   . Pre-diabetes   . PVD (peripheral vascular disease) ([facility])    a. ?R subclavian stenosis by CT 09/2016.  Marland Kitchen Seborrheic dermatitis of scalp   . Thrombocytopenia ([place])   . Tobacco abuse    Review of Systems:  Review of Systems  Constitutional: Negative for chills and fever.  HENT: Positive for ear pain. Negative for ear discharge.   Respiratory: Negative for shortness of breath.      Physical Exam:  Vitals:   04/04/17 1312  BP: (!) 146/82  Pulse: 81  Temp: 98.2 F (36.8  C)  TempSrc: Oral  SpO2: 98%  Weight: 234 lb 9.6 oz (106.4 kg)   Physical Exam  Constitutional: He appears well-developed and well-nourished.  HENT:  Right Ear: External ear normal.  Mouth/Throat: Oropharynx is clear and moist. No oropharyngeal exudate.  L external ear is erythematous and swollen with tenderness to touch of areas of outer ear. Swelling/erythema does not extend over the mastoid. L TM is clear with light reflex. R TM also normal, minimal cerumen in bilateral canals   Neck:  Slight L preauricular lymphadenopathy   Cardiovascular: Normal rate, regular rhythm and normal heart sounds.  Pulmonary/Chest: Breath sounds normal.    Assessment & Plan:   See Encounters Tab for problem based charting.  Patient seen with Dr. Dareen Piano

## 2017-04-05 NOTE — Assessment & Plan Note (Addendum)
Pt presents with the acute onset of pain, erythema, and swelling to his outer ear consistent with otitis externa. No middle ear effusion, there is no swelling over the mastoid, no fever, and his pain and redness is fairly well localized at his ear, so low suspicion of otitis media, mastoiditis or other spread of this infection. Will treat with antibiotic drops with a steroid, return precautions given should he fail to improve in a timely manner or worsen.   --Cipro HC, 3 drops BID x 7 days  --Tylenol

## 2017-04-06 ENCOUNTER — Ambulatory Visit (INDEPENDENT_AMBULATORY_CARE_PROVIDER_SITE_OTHER): Payer: Medicare HMO | Admitting: Internal Medicine

## 2017-04-06 ENCOUNTER — Telehealth: Payer: Self-pay | Admitting: *Deleted

## 2017-04-06 ENCOUNTER — Other Ambulatory Visit: Payer: Self-pay

## 2017-04-06 VITALS — BP 138/71 | HR 78 | Wt 230.0 lb

## 2017-04-06 DIAGNOSIS — H6092 Unspecified otitis externa, left ear: Secondary | ICD-10-CM

## 2017-04-06 DIAGNOSIS — G629 Polyneuropathy, unspecified: Secondary | ICD-10-CM

## 2017-04-06 DIAGNOSIS — Z7901 Long term (current) use of anticoagulants: Secondary | ICD-10-CM

## 2017-04-06 DIAGNOSIS — I480 Paroxysmal atrial fibrillation: Secondary | ICD-10-CM | POA: Diagnosis not present

## 2017-04-06 DIAGNOSIS — H60502 Unspecified acute noninfective otitis externa, left ear: Secondary | ICD-10-CM

## 2017-04-06 MED ORDER — GABAPENTIN 100 MG PO CAPS: 100.0000 mg | ORAL_CAPSULE | Freq: Every day | ORAL | 1 refills | Status: DC

## 2017-04-06 MED ORDER — CIPROFLOXACIN HCL 500 MG PO TABS: 500.0000 mg | ORAL_TABLET | Freq: Two times a day (BID) | ORAL | 0 refills | Status: AC

## 2017-04-06 NOTE — Progress Notes (Signed)
Internal Medicine Clinic Attending  I saw and evaluated the patient.  I personally confirmed the key portions of the history and exam documented by Dr. Harden and I reviewed pertinent patient test results.  The assessment, diagnosis, and plan were formulated together and I agree with the documentation in the resident's note.  

## 2017-04-06 NOTE — Progress Notes (Signed)
   CC: Ear Pain   HPI:  Dylan Morgan is a 72 y.o. male with past medical history as described below who presents to the clinic for follow-up of his ear pain.  L Ear Pain: The patient was recently evaluated on 04/04/17 for left ear pain and swelling which was felt to be consistent with otitis externa.  He was prescribed ciprofloxacin plus hydrocortisone otic drops for 7 days and instructed to take Tylenol for further pain relief.  He states the prescription for the antibiotic eardrops were too expensive to fill as they would have been over $100 out-of-pocket cost.  He states his symptoms have not changed and remain stable.  He attempted to use a heating pad last night and fell asleep with a heating pad on his face for longer than intended.  He denies fever or increase in pain.  Past Medical History:  Diagnosis Date  . AAA (abdominal aortic aneurysm) ([place])    a. 2010 s/p stenting  . Anemia   . Arthritis    "pretty much all over" (09/06/2016)  . Blind left eye    "explosion knocked hole in retina" (09/06/2016)  . Bradycardia   . CAD (coronary artery disease) CABG in 2004   a. 2004 s/p CABG. b. NSTEMI 09/2016 -> cath with occ VG-distal Cx, managed medically.  . Carotid artery disease ([place])    a. 2012 dopplers with old LICA occlusion , RICA no significant abnormality  . Chronic lower back pain   . CKD (chronic kidney disease), stage III ([place])   . CVA (cerebral vascular accident) [facility])    a. 2013 R Carona radiata stroke   . GERD (gastroesophageal reflux disease)   . History of blood transfusion 2004   "related to OHS"  . Hyperlipidemia   . Hypertension   . Paroxysmal atrial fibrillation ([place]) 02/22/2009   a. on Coumadin   . Pre-diabetes   . PVD (peripheral vascular disease) ([place])    a. ?R subclavian stenosis by CT 09/2016.  Marland Kitchen Seborrheic dermatitis of scalp   . Thrombocytopenia ([place])   . Tobacco abuse    Review of Systems:  Review of Systems  Constitutional: Negative for fever.    HENT: Positive for ear pain. Negative for ear discharge.      Physical Exam:  Vitals:   04/06/17 1306  BP: 138/71  Pulse: 78  SpO2: 97%  Weight: 230 lb (104.3 kg)   General: Elderly male sitting in chair comfortably, no acute distress HEENT: Continued erythema and swelling to right outer ear, TM remains normal.  Preauricular lymphadenopathy.  Increased erythema anterior to right ear.  No swelling or redness or mastoid, no oral or buccal lesions. CV; RRR Neuro: Alert and oriented x3 Skin: Warm, dry      Assessment & Plan:   See Encounters Tab for problem based charting.  Patient seen with Dr. Dareen Piano

## 2017-04-06 NOTE — Telephone Encounter (Signed)
Spoke w/ pharmacy, VO to fill cipro Called pt, gave him instructions to not take coumadin today and tomorrow then resume Sunday, go to [facility] Monday for INR check and inform them that he is taking the cipro, he is to then call clinic and leave a message for dr's narendra and harden what his INR was. Repeated to pt 3 times and he repeated back

## 2017-04-06 NOTE — Telephone Encounter (Signed)
rtc to pharmacy, pharmacist has concerns about the cipro and coumadin. Dr harden will talk with dr Dareen Piano

## 2017-04-06 NOTE — Telephone Encounter (Signed)
Sameer from [place] regarding patient medicine

## 2017-04-06 NOTE — Assessment & Plan Note (Addendum)
Patient presents with persistent otitis externa after he was unable to fill prescription for Cipro HC drops.  He does have increased preauricular erythema which may be related to prolonged use of heating pad the night prior or extension of infection.  At this point, we will treat with oral Cipro which will be a much lower cost and should be affordable for the patient and provide adequate penetration if the underlying infection has worsened.  He was also instructed to avoid placing any foreign objects in his ear after he mentioned trying sweet oil as a home remedy.  --PO Cipro 500 mg BID x 7 days --Cont Tylenol   Addendum: On review pt is currently taking Warfarin managed by an outside coumadin clinic for paroxysmal a-fib. Upon discussion with pharmacy here, we called the pt and he was instructed to skip warfarin doses today and tomorrow, take his dose on 1/6, and then report to his coumadin clinic or back to the clinic here for an INR check and any further adjustments while on this Cipro course.

## 2017-04-06 NOTE — Patient Instructions (Signed)
Good to see you again Dylan Morgan.  We will switch over to a pill antibiotic which we sent to your pharmacy.  You will take 500 mg 2 times a day for 7 days which should improve your ear pain and swelling.  Avoid doing any sweet oil, cotton balls or swabs for now to avoid any further irritation.

## 2017-04-09 ENCOUNTER — Ambulatory Visit (INDEPENDENT_AMBULATORY_CARE_PROVIDER_SITE_OTHER): Payer: Medicare HMO | Admitting: *Deleted

## 2017-04-09 ENCOUNTER — Telehealth: Payer: Self-pay | Admitting: *Deleted

## 2017-04-09 DIAGNOSIS — Z5181 Encounter for therapeutic drug level monitoring: Secondary | ICD-10-CM

## 2017-04-09 DIAGNOSIS — I480 Paroxysmal atrial fibrillation: Secondary | ICD-10-CM

## 2017-04-09 DIAGNOSIS — G459 Transient cerebral ischemic attack, unspecified: Secondary | ICD-10-CM | POA: Diagnosis not present

## 2017-04-09 DIAGNOSIS — Z951 Presence of aortocoronary bypass graft: Secondary | ICD-10-CM

## 2017-04-09 LAB — POCT INR: INR: 1.1

## 2017-04-09 NOTE — Patient Instructions (Signed)
Description   Today Jan 7th take coumadin 9 mg (3 tablets) then  continue taking 1 tablet everyday except 2 tablets on Mondays and Fridays. Recheck in 1 week.  Will finish Cipro on Thursday night Please call with any changes in medications or new medications or if scheduled for any procedures [phone]

## 2017-04-09 NOTE — Progress Notes (Signed)
Internal Medicine Clinic Attending  I saw and evaluated the patient.  I personally confirmed the key portions of the history and exam documented by Dr. Harden and I reviewed pertinent patient test results.  The assessment, diagnosis, and plan were formulated together and I agree with the documentation in the resident's note.  

## 2017-04-09 NOTE — Telephone Encounter (Signed)
Pt walked in to the office & stated he was told to come here to see Anticoagulation per his PCP.  Pt was unsure of the medication he was taking.  After a brief search noted that the pt was prescribed Cipro 500mg  BID for 7 days & was instructed by them to hold his Coumadin on 04/06/17 & 04/07/17 then resume on Sunday 04/08/17 per PCP.  Advised that in the future please call Anticoagulation Clinic when he is prescribed any new meds before holding his Coumadin. Appt set for the pt.

## 2017-04-13 ENCOUNTER — Other Ambulatory Visit: Payer: Medicare HMO | Admitting: *Deleted

## 2017-04-13 ENCOUNTER — Ambulatory Visit (INDEPENDENT_AMBULATORY_CARE_PROVIDER_SITE_OTHER): Payer: Medicare HMO | Admitting: Pharmacist

## 2017-04-13 DIAGNOSIS — Z5181 Encounter for therapeutic drug level monitoring: Secondary | ICD-10-CM | POA: Diagnosis not present

## 2017-04-13 DIAGNOSIS — I480 Paroxysmal atrial fibrillation: Secondary | ICD-10-CM | POA: Diagnosis not present

## 2017-04-13 DIAGNOSIS — I10 Essential (primary) hypertension: Secondary | ICD-10-CM | POA: Diagnosis not present

## 2017-04-13 LAB — BASIC METABOLIC PANEL
BUN / CREAT RATIO: 15 (ref 10–24)
BUN: 24 mg/dL (ref 8–27)
CALCIUM: 9.1 mg/dL (ref 8.6–10.2)
CO2: 22 mmol/L (ref 20–29)
Chloride: 101 mmol/L (ref 96–106)
Creatinine, Ser: 1.55 mg/dL — ABNORMAL HIGH (ref 0.76–1.27)
GFR, EST AFRICAN AMERICAN: 51 mL/min/{1.73_m2} — AB (ref >59)
GFR, EST NON AFRICAN AMERICAN: 44 mL/min/{1.73_m2} — AB (ref >59)
Glucose: 122 mg/dL — ABNORMAL HIGH (ref 65–99)
POTASSIUM: 4.7 mmol/L (ref 3.5–5.2)
Sodium: 139 mmol/L (ref 134–144)

## 2017-04-13 LAB — POCT INR: INR: 1.7

## 2017-04-13 NOTE — Patient Instructions (Signed)
Description   Today Jan 11th take coumadin 9 mg (3 tablets) then continue taking 1 tablet everyday except 2 tablets on Mondays and Fridays. Recheck in 7 days.  Please call with any changes in medications or new medications or if scheduled for any procedures [phone]

## 2017-04-20 ENCOUNTER — Ambulatory Visit (INDEPENDENT_AMBULATORY_CARE_PROVIDER_SITE_OTHER): Payer: Medicare HMO | Admitting: *Deleted

## 2017-04-20 DIAGNOSIS — G459 Transient cerebral ischemic attack, unspecified: Secondary | ICD-10-CM | POA: Diagnosis not present

## 2017-04-20 DIAGNOSIS — Z5181 Encounter for therapeutic drug level monitoring: Secondary | ICD-10-CM

## 2017-04-20 DIAGNOSIS — I480 Paroxysmal atrial fibrillation: Secondary | ICD-10-CM | POA: Diagnosis not present

## 2017-04-20 LAB — POCT INR: INR: 3.5

## 2017-04-20 NOTE — Patient Instructions (Addendum)
  Description   Do not take coumadin today Jan 18th then continue taking 1 tablet everyday except 2 tablets on Mondays and Fridays. Recheck in 2 weeks.  Please call with any changes in medications or new medications or if scheduled for any procedures [phone]

## 2017-05-04 ENCOUNTER — Ambulatory Visit (INDEPENDENT_AMBULATORY_CARE_PROVIDER_SITE_OTHER): Payer: Medicare HMO | Admitting: *Deleted

## 2017-05-04 DIAGNOSIS — Z5181 Encounter for therapeutic drug level monitoring: Secondary | ICD-10-CM

## 2017-05-04 DIAGNOSIS — G459 Transient cerebral ischemic attack, unspecified: Secondary | ICD-10-CM | POA: Diagnosis not present

## 2017-05-04 DIAGNOSIS — I480 Paroxysmal atrial fibrillation: Secondary | ICD-10-CM | POA: Diagnosis not present

## 2017-05-04 DIAGNOSIS — Z951 Presence of aortocoronary bypass graft: Secondary | ICD-10-CM | POA: Diagnosis not present

## 2017-05-04 LAB — POCT INR: INR: 2.5

## 2017-05-04 NOTE — Patient Instructions (Signed)
Description   Continue taking 1 tablet everyday except 2 tablets on Mondays and Fridays. Recheck in3 weeks.  Please call with any changes in medications or new medications or if scheduled for any procedures [phone]

## 2017-05-08 ENCOUNTER — Encounter: Payer: Self-pay | Admitting: Internal Medicine

## 2017-05-08 ENCOUNTER — Ambulatory Visit (INDEPENDENT_AMBULATORY_CARE_PROVIDER_SITE_OTHER): Payer: Medicare HMO | Admitting: Internal Medicine

## 2017-05-08 VITALS — BP 141/66 | HR 62 | Temp 97.9°F | Wt 234.8 lb

## 2017-05-08 DIAGNOSIS — Z6834 Body mass index (BMI) 34.0-34.9, adult: Secondary | ICD-10-CM | POA: Diagnosis not present

## 2017-05-08 DIAGNOSIS — R05 Cough: Secondary | ICD-10-CM | POA: Diagnosis not present

## 2017-05-08 DIAGNOSIS — G629 Polyneuropathy, unspecified: Secondary | ICD-10-CM

## 2017-05-08 DIAGNOSIS — R52 Pain, unspecified: Secondary | ICD-10-CM | POA: Insufficient documentation

## 2017-05-08 DIAGNOSIS — Z79899 Other long term (current) drug therapy: Secondary | ICD-10-CM | POA: Diagnosis not present

## 2017-05-08 DIAGNOSIS — M79606 Pain in leg, unspecified: Secondary | ICD-10-CM

## 2017-05-08 DIAGNOSIS — Z87891 Personal history of nicotine dependence: Secondary | ICD-10-CM

## 2017-05-08 DIAGNOSIS — R0600 Dyspnea, unspecified: Secondary | ICD-10-CM | POA: Diagnosis not present

## 2017-05-08 DIAGNOSIS — E669 Obesity, unspecified: Secondary | ICD-10-CM

## 2017-05-08 MED ORDER — ALBUTEROL SULFATE HFA 108 (90 BASE) MCG/ACT IN AERS: 2.0000 | INHALATION_SPRAY | Freq: Four times a day (QID) | RESPIRATORY_TRACT | 0 refills | Status: DC | PRN

## 2017-05-08 MED ORDER — OSELTAMIVIR PHOSPHATE 30 MG PO CAPS: 30.0000 mg | ORAL_CAPSULE | Freq: Every day | ORAL | 0 refills | Status: AC

## 2017-05-08 MED ORDER — GABAPENTIN 300 MG PO CAPS: 300.0000 mg | ORAL_CAPSULE | Freq: Two times a day (BID) | ORAL | 1 refills | Status: DC

## 2017-05-08 NOTE — Progress Notes (Signed)
   CC: Diffuse body aches and peripheral neuropathy follow up   HPI:  [name] is a 72 y.o. male with PMH listed below who presents to clinic for new onset of diffuse body aches and peripheral neuropathy follow-up.  Please see problem based assessment and plan for further details.  Past Medical History:  Diagnosis Date  . AAA (abdominal aortic aneurysm) ([place])    a. 2010 s/p stenting  . Anemia   . Arthritis    "pretty much all over" (09/06/2016)  . Blind left eye    "explosion knocked hole in retina" (09/06/2016)  . Bradycardia   . CAD (coronary artery disease) CABG in 2004   a. 2004 s/p CABG. b. NSTEMI 09/2016 -> cath with occ VG-distal Cx, managed medically.  . Carotid artery disease ([place])    a. 2012 dopplers with old LICA occlusion , RICA no significant abnormality  . Chronic lower back pain   . CKD (chronic kidney disease), stage III ([place])   . CVA (cerebral vascular accident) [facility])    a. 2013 R Carona radiata stroke   . GERD (gastroesophageal reflux disease)   . History of blood transfusion 2004   "related to OHS"  . Hyperlipidemia   . Hypertension   . Paroxysmal atrial fibrillation ([place]) 02/22/2009   a. on Coumadin   . Pre-diabetes   . PVD (peripheral vascular disease) ([place])    a. ?R subclavian stenosis by CT 09/2016.  Marland Kitchen Seborrheic dermatitis of scalp   . Thrombocytopenia ([place])   . Tobacco abuse    Review of Systems:   Review of Systems  Constitutional: Positive for malaise/fatigue. Negative for chills, diaphoresis and fever.       Diffuse body aches  HENT: Negative for congestion, ear pain, sinus pain and sore throat.   Respiratory: Positive for cough and shortness of breath.   Cardiovascular: Negative for chest pain, palpitations and leg swelling.  Gastrointestinal: Negative for abdominal pain, diarrhea, nausea and vomiting.  Musculoskeletal: Positive for myalgias.  Neurological: Negative for weakness.     Physical Exam:  Vitals:   05/08/17 1325  BP:  (!) 141/66  Pulse: 62  Temp: 97.9 F (36.6 C)  TempSrc: Oral  SpO2: 98%  Weight: 234 lb 12.8 oz (106.5 kg)   General: elderly male, obese, well-developed, in pain but in no acute distress HENT: NCAT, neck supple and FROM, no adenopathy, OP clear without exudates or erythema  Cardiac: regular rate and rhythm, nl S1/S2, no murmurs, rubs or gallops  Pulm: Diffuse expiratory wheezes, no crackles, no increased work of breathing on room air Abd: soft, NTND, bowel sounds present Ext: warm and well perfused, no peripheral edema    Assessment & Plan:   See Encounters Tab for problem based charting.  Patient discussed with Dr. Angelia Mould

## 2017-05-08 NOTE — Patient Instructions (Signed)
It was nice to meet you today, Dylan Morgan.   I sent a prescription for Tamiflu to your pharmacy. Please take 1 tablet for the next 5 days. Please return to clinic if your symptoms have not improved in the next week or 2.   I also sent a prescription for an albuterol inhaler. You can take up to 2 puffs every 6 hours as needed.   For your leg pain, I sent a new prescription for gabapentin at a higher dose. You will take 300 mg two times a day.   Please call clinic if you have any questions.

## 2017-05-08 NOTE — Assessment & Plan Note (Signed)
Patient presents requesting Lyrica for peripheral neuropathy. He has a long history of peripheral neuropathy of unclear etiology.  He is currently on gabapentin 100 mg at bedtime but continues to have severe burning and tingling sensation in bilateral feet.  Per chart, he was on Lyrica 50 3 times daily in the past which was discontinued per his request.  Discussed with patient will maximize gabapentin at this time rather than adding a second agent.  He was advised to follow-up with PCP.   - Gabapentin 100 mg QHS --> 300 mg BID  - Follow up with PCP

## 2017-05-08 NOTE — Assessment & Plan Note (Addendum)
Patient presents with a 2-day history of diffuse body aches associated with dyspnea and productive cough of clear sputum.  States he feels very tired and is lost his appetite.  Denies fever, chills, sore throat, sinus congestion, and ear pain.  Denies sick contacts.  Did not receive flu shot.  Patient reports history of COPD however this is not documented in his chart.  Chest CT from 09/2016 with no emphysema noted.  He is not on any inhalers for this at home.  On exam he has diffuse expiratory wheezes.  Cardiac exam is benign.  No lymphadenopathy.  We will treat empirically for influenza given presenting symptoms and seasonal incidence.  Patient was advised to return to clinic if symptoms fail to improve or worsen within the next few days.   - Tamiflu 30 mg x5 days (renally dosed) - Albuterol inhaler

## 2017-05-09 ENCOUNTER — Encounter: Payer: Self-pay | Admitting: Interventional Cardiology

## 2017-05-09 NOTE — Progress Notes (Signed)
Internal Medicine Clinic Attending  Case discussed with Dr. Santos-Sanchez at the time of the visit.  We reviewed the resident's history and exam and pertinent patient test results.  I agree with the assessment, diagnosis, and plan of care documented in the resident's note.    

## 2017-05-11 ENCOUNTER — Other Ambulatory Visit: Payer: Self-pay

## 2017-05-11 ENCOUNTER — Emergency Department (HOSPITAL_COMMUNITY): Payer: Medicare HMO

## 2017-05-11 ENCOUNTER — Emergency Department (HOSPITAL_COMMUNITY)
Admission: EM | Admit: 2017-05-11 | Discharge: 2017-05-11 | Disposition: A | Discharge status: Home / Self Care | Payer: Medicare HMO | Attending: Emergency Medicine | Admitting: Emergency Medicine

## 2017-05-11 ENCOUNTER — Encounter (HOSPITAL_COMMUNITY): Payer: Self-pay

## 2017-05-11 DIAGNOSIS — W010XXA Fall on same level from slipping, tripping and stumbling without subsequent striking against object, initial encounter: Secondary | ICD-10-CM | POA: Diagnosis not present

## 2017-05-11 DIAGNOSIS — N183 Chronic kidney disease, stage 3 (moderate): Secondary | ICD-10-CM | POA: Insufficient documentation

## 2017-05-11 DIAGNOSIS — Y9389 Activity, other specified: Secondary | ICD-10-CM | POA: Diagnosis not present

## 2017-05-11 DIAGNOSIS — S60221A Contusion of right hand, initial encounter: Secondary | ICD-10-CM

## 2017-05-11 DIAGNOSIS — I2510 Atherosclerotic heart disease of native coronary artery without angina pectoris: Secondary | ICD-10-CM | POA: Diagnosis not present

## 2017-05-11 DIAGNOSIS — E785 Hyperlipidemia, unspecified: Secondary | ICD-10-CM | POA: Insufficient documentation

## 2017-05-11 DIAGNOSIS — S6991XA Unspecified injury of right wrist, hand and finger(s), initial encounter: Secondary | ICD-10-CM | POA: Diagnosis present

## 2017-05-11 DIAGNOSIS — Y998 Other external cause status: Secondary | ICD-10-CM | POA: Insufficient documentation

## 2017-05-11 DIAGNOSIS — Z7902 Long term (current) use of antithrombotics/antiplatelets: Secondary | ICD-10-CM | POA: Insufficient documentation

## 2017-05-11 DIAGNOSIS — M7989 Other specified soft tissue disorders: Secondary | ICD-10-CM | POA: Diagnosis not present

## 2017-05-11 DIAGNOSIS — I4510 Unspecified right bundle-branch block: Secondary | ICD-10-CM | POA: Diagnosis not present

## 2017-05-11 DIAGNOSIS — Y929 Unspecified place or not applicable: Secondary | ICD-10-CM | POA: Insufficient documentation

## 2017-05-11 DIAGNOSIS — Z79899 Other long term (current) drug therapy: Secondary | ICD-10-CM | POA: Insufficient documentation

## 2017-05-11 DIAGNOSIS — Z8673 Personal history of transient ischemic attack (TIA), and cerebral infarction without residual deficits: Secondary | ICD-10-CM | POA: Insufficient documentation

## 2017-05-11 DIAGNOSIS — Z7901 Long term (current) use of anticoagulants: Secondary | ICD-10-CM | POA: Diagnosis not present

## 2017-05-11 DIAGNOSIS — R0602 Shortness of breath: Secondary | ICD-10-CM | POA: Diagnosis not present

## 2017-05-11 DIAGNOSIS — Z87891 Personal history of nicotine dependence: Secondary | ICD-10-CM | POA: Insufficient documentation

## 2017-05-11 DIAGNOSIS — M79641 Pain in right hand: Secondary | ICD-10-CM | POA: Diagnosis not present

## 2017-05-11 DIAGNOSIS — R05 Cough: Secondary | ICD-10-CM | POA: Diagnosis not present

## 2017-05-11 DIAGNOSIS — I480 Paroxysmal atrial fibrillation: Secondary | ICD-10-CM | POA: Insufficient documentation

## 2017-05-11 DIAGNOSIS — I129 Hypertensive chronic kidney disease with stage 1 through stage 4 chronic kidney disease, or unspecified chronic kidney disease: Secondary | ICD-10-CM | POA: Insufficient documentation

## 2017-05-11 MED ORDER — TRAMADOL HCL 50 MG PO TABS: 50.0000 mg | ORAL_TABLET | Freq: Four times a day (QID) | ORAL | 0 refills | Status: DC | PRN

## 2017-05-11 NOTE — ED Provider Notes (Signed)
[place] EMERGENCY DEPARTMENT Provider Note   CSN: [id number] Arrival date & time: 05/11/17  0736     History   Chief Complaint Chief Complaint  Patient presents with  . Hand Pain    HPI Dylan Morgan is a 72 y.o. male.  Patient is a 72 year old male who presents with right hand pain.  He states he was hooking up a trailer and he knocked his left leg against the trailer and because of that he lost his balance and fell onto the ground.  He fell with both of his arms outstretched.  He is on Coumadin but he did not hit his head.  He denies any neck or back pain.  He denies any other injuries from the fall.  He has a little soreness in his left leg where he bumped it but he is ambulating without difficulty.  His INR was checked recently within the last week and was in the normal range.      Past Medical History:  Diagnosis Date  . AAA (abdominal aortic aneurysm) ([place])    a. 2010 s/p stenting  . Anemia   . Arthritis    "pretty much all over" (09/06/2016)  . Blind left eye    "explosion knocked hole in retina" (09/06/2016)  . Bradycardia   . CAD (coronary artery disease) CABG in 2004   a. 2004 s/p CABG. b. NSTEMI 09/2016 -> cath with occ VG-distal Cx, managed medically.  . Carotid artery disease ([place])    a. 2012 dopplers with old LICA occlusion , RICA no significant abnormality  . Chronic lower back pain   . CKD (chronic kidney disease), stage III ([place])   . CVA (cerebral vascular accident) [facility])    a. 2013 R Carona radiata stroke   . GERD (gastroesophageal reflux disease)   . History of blood transfusion 2004   "related to OHS"  . Hyperlipidemia   . Hypertension   . Paroxysmal atrial fibrillation ([place]) 02/22/2009   a. on Coumadin   . Pre-diabetes   . PVD (peripheral vascular disease) ([place])    a. ?R subclavian stenosis by CT 09/2016.  Marland Kitchen Seborrheic dermatitis of scalp   . Thrombocytopenia ([place])   . Tobacco abuse     Patient Active Problem List   Diagnosis Date Noted  . Generalized body aches 05/08/2017  . Acute otitis externa of left ear 04/04/2017  . Hyperkalemia 01/12/2017  . Thoracic aortic aneurysm ([place]) 09/10/2016  . Chronic obstructive pulmonary disease ([place])   . Vitamin B 12 deficiency 05/02/2016  . Closed fracture of left tibial plateau 01/18/2016  . Carpal tunnel syndrome, bilateral 01/11/2016  . S/P AAA (abdominal aortic aneurysm) repair 06/21/2014  . Peripheral neuropathy 01/16/2014  . Onychomycosis 01/10/2013  . Chronic anticoagulation 05/01/2012  . Occlusion and stenosis of carotid artery without mention of cerebral infarction 12/19/2011  . TIA (transient ischemic attack) 11/23/2011  . Preventative health care 09/20/2011  . CVA (cerebral infarction)   . Prediabetes   . Long term (current) use of anticoagulants 08/04/2011  . Carotid artery disease ([place])   . CAD (coronary artery disease)   . Hypertension   . Hyperlipidemia   . Hx of CABG   . AAA (abdominal aortic aneurysm) ([place])   . Erectile dysfunction 02/06/2011  . Paroxysmal atrial fibrillation ([place]) 02/22/2009  . Sleep apnea 02/04/2007  . CKD (chronic kidney disease), stage III ([place]) 02/20/2006    Past Surgical History:  Procedure Laterality Date  . ABDOMINAL AORTIC ANEURYSM  REPAIR  2010   Aortic stent repair  . CATARACT EXTRACTION W/ INTRAOCULAR LENS  IMPLANT, BILATERAL    . CORONARY ARTERY BYPASS GRAFT  2004   "CABG X3"  . LEFT HEART CATH AND CORS/GRAFTS ANGIOGRAPHY N/A 09/08/2016   Procedure: Left Heart Cath and Cors/Grafts Angiography;  Surgeon: Troy Sine, MD;  Location: [place] [facility] CV LAB;  Service: Cardiovascular;  Laterality: N/A;  . SHOULDER OPEN ROTATOR CUFF REPAIR Left 1990       Home Medications    Prior to Admission medications   Medication Sig Start Date End Date Taking? Authorizing Provider  albuterol (PROVENTIL HFA;VENTOLIN HFA) 108 (90 Base) MCG/ACT inhaler Inhale 2 puffs into the lungs every 6 (six) hours as needed for  wheezing or shortness of breath. 05/08/17   Welford Roche, MD  atorvastatin (LIPITOR) 80 MG tablet Take 1 tablet (80 mg total) by mouth daily at 6 PM. 01/18/17   Jettie Booze, MD  clopidogrel (PLAVIX) 75 MG tablet TAKE 1 TABLET BY MOUTH ONCE DAILY 01/17/17   Bartholomew Crews, MD  fenofibrate (TRICOR) 145 MG tablet Take 1 tablet (145 mg total) by mouth daily. 01/18/17   Jettie Booze, MD  fluticasone [facility]) 50 MCG/ACT nasal spray Place 2 sprays into both nostrils daily. 07/13/16   Jule Ser, DO  gabapentin (NEURONTIN) 300 MG capsule Take 1 capsule (300 mg total) by mouth 2 (two) times daily. 05/08/17 05/08/18  Welford Roche, MD  isosorbide mononitrate (IMDUR) 60 MG 24 hr tablet TAKE 1 TABLET BY MOUTH ONCE DAILY 03/29/17   Oda Kilts, MD  lisinopril (PRINIVIL,ZESTRIL) 5 MG tablet Take 1 tablet (5 mg total) by mouth daily. 10/03/16 10/03/17  Jettie Booze, MD  loratadine (CLARITIN) 10 MG tablet Take 1 tablet (10 mg total) by mouth daily. 07/13/16 07/13/17  Jule Ser, DO  magnesium oxide (MAG-OX) 400 (241.3 Mg) MG tablet TAKE 1 TABLET BY MOUTH ONCE DAILY 03/28/17   Dunn, Lisbeth Renshaw N, PA-C  magnesium oxide (MAG-OX) 400 MG tablet Take 1 tablet (400 mg total) by mouth daily. 09/20/16   Dunn, Nedra Hai, PA-C  metoprolol succinate (TOPROL-XL) 25 MG 24 hr tablet Take 1 tablet (25 mg total) by mouth daily. 01/23/17   Aldine Contes, MD  Multiple Vitamin (MULTIVITAMIN) tablet Take 1 tablet by mouth daily. 02/29/16   Riccardo Dubin, MD  oseltamivir (TAMIFLU) 30 MG capsule Take 1 capsule (30 mg total) by mouth daily for 5 days. 05/08/17 05/13/17  Welford Roche, MD  torsemide (DEMADEX) 20 MG tablet Take 1 tablet (20 mg total) by mouth daily. 01/23/17 04/23/17  Jettie Booze, MD  traMADol (ULTRAM) 50 MG tablet Take 1 tablet (50 mg total) by mouth every 6 (six) hours as needed. 05/11/17   Malvin Johns, MD  vitamin B-12 (CYANOCOBALAMIN) 1000 MCG tablet  Take 1 tablet (1,000 mcg total) by mouth daily. 02/29/16   Riccardo Dubin, MD  warfarin (COUMADIN) 3 MG tablet Take as directed by the coumadin clinic 01/31/17   Constance Haw, MD    Family History Family History  Problem Relation Age of Onset  . Diabetes Mother   . Stroke Father   . Thyroid cancer Sister   . Heart disease Brother        Coronary artery disease  . Heart attack Brother   . Heart attack Brother     Social History Social History   Tobacco Use  . Smoking status: Former Smoker    Packs/day: 3.00  Years: 51.00    Pack years: 153.00    Types: Cigarettes    Last attempt to quit: 02/03/2010    Years since quitting: 7.2  . Smokeless tobacco: Never Used  . Tobacco comment: "chewed tobacco 1-2 times'  Substance Use Topics  . Alcohol use: Yes    Alcohol/week: 0.0 oz    Comment: 09/06/2016 "nothing in a long while; used to drink q now and then"  . Drug use: Yes    Types: Marijuana    Comment: 09/06/2016 "stopped in 2002"     Allergies   Diltiazem hcl   Review of Systems Review of Systems  Constitutional: Negative for fever.  Gastrointestinal: Negative for nausea and vomiting.  Musculoskeletal: Positive for arthralgias and joint swelling. Negative for back pain and neck pain.  Skin: Negative for wound.  Neurological: Negative for weakness, numbness and headaches.     Physical Exam Updated Vital Signs BP (!) 139/105 (BP Location: Right Arm)   Pulse 60   Temp 97.8 F (36.6 C) (Oral)   Resp 18   Ht 5\' 11"  (1.803 m)   Wt 106.1 kg (234 lb)   SpO2 96%   BMI 32.64 kg/m   Physical Exam  Constitutional: He is oriented to person, place, and time. He appears well-developed and well-nourished.  HENT:  Head: Normocephalic and atraumatic.  Neck: Normal range of motion. Neck supple.  No pain along the spine  Cardiovascular: Normal rate.  Pulmonary/Chest: Effort normal.  Musculoskeletal: He exhibits edema and tenderness.  Patient has some swelling and  ecchymosis to the dorsum of the right hand, particularly over the third MCP joint.  This is where the majority of the tenderness is.  There is no tenderness to the wrist or forearm.  He has full range of motion in his hand other than he cannot do complete extension of the fingers, which may be due to the swelling.  He has normal sensation to light touch in all nerve distributions, radial pulses are intact.  Capillary refill is less than 2 distally, there are no wounds.  Neurological: He is alert and oriented to person, place, and time.  Skin: Skin is warm and dry.  Psychiatric: He has a normal mood and affect.     ED Treatments / Results  Labs (all labs ordered are listed, but only abnormal results are displayed) Labs Reviewed - No data to display  EKG  EKG Interpretation None       Radiology Dg Chest 2 View  Result Date: 05/11/2017 CLINICAL DATA:  Shortness of breath and cough for 1 week. EXAM: CHEST  2 VIEW COMPARISON:  Chest radiograph 09/06/2016. FINDINGS: Stable enlarged cardiac and mediastinal contours status post median sternotomy. Tortuosity of the thoracic aorta. Heterogeneous opacities lung bases bilaterally. No pleural effusion or pneumothorax. Thoracic spine degenerative changes. IMPRESSION: Basilar atelectasis.  No acute cardiopulmonary process. Electronically Signed   By: Lovey Newcomer M.D.   On: 05/11/2017 08:28   Dg Hand Complete Right  Result Date: 05/11/2017 CLINICAL DATA:  Pain, swelling of right hand EXAM: RIGHT HAND - COMPLETE 3+ VIEW COMPARISON:  07/02/2012 FINDINGS: Moderate arthritic changes in the right wrist joint with radiocarpal joint space narrowing and chondrocalcinosis in the triangular fibrocartilage. Joint space narrowing in the IP joints and MCP joints, particularly the 3rd MCP joint. No acute bony abnormality. Specifically, no fracture, subluxation, or dislocation. IMPRESSION: Arthritic changes in the hand and wrist as above. No acute bony abnormality.  Electronically Signed   By: Lennette Bihari  [place] M.D.   On: 05/11/2017 08:27    Procedures Procedures (including critical care time)  Medications Ordered in ED Medications - No data to display   Initial Impression / Assessment and Plan / ED Course  I have reviewed the triage vital signs and the nursing notes.  Pertinent labs & imaging results that were available during my care of the patient were reviewed by me and considered in my medical decision making (see chart for details).     No fracture is visualized on imaging.  He was placed in a Ace wrap.  He was advised on ice and elevation.  He was advised to keep it elevated above his heart.  He was given a prescription for tramadol for pain.  He has an appointment on Tuesday to follow-up with his primary care physician at the internal medicine clinic.  I advised him to have it rechecked at that time.  If it still tender, he may need reimaging or referral to a hand specialist.  Final Clinical Impressions(s) / ED Diagnoses   Final diagnoses:  Contusion of right hand, initial encounter    ED Discharge Orders        Ordered    traMADol (ULTRAM) 50 MG tablet  Every 6 hours PRN     05/11/17 1128       Malvin Johns, MD 05/11/17 1137

## 2017-05-11 NOTE — ED Notes (Signed)
Pt has full ROM in R wrist/hand. Swelling noted. Pain with movement to R hand/wrist.

## 2017-05-11 NOTE — ED Triage Notes (Signed)
Per Pt, Pt is coming from home with complaints of right hand pain secondary to a trailer accident yesterday. Pt reports that he caught his hand on a rock when he was trying to make sure his trailer was hooked up correctly. Swelling noted to the right hand.

## 2017-05-13 ENCOUNTER — Emergency Department (HOSPITAL_COMMUNITY)
Admission: EM | Admit: 2017-05-13 | Discharge: 2017-05-13 | Disposition: A | Discharge status: Home / Self Care | Payer: Medicare HMO | Attending: Emergency Medicine | Admitting: Emergency Medicine

## 2017-05-13 ENCOUNTER — Encounter (HOSPITAL_COMMUNITY): Payer: Self-pay

## 2017-05-13 ENCOUNTER — Other Ambulatory Visit: Payer: Self-pay

## 2017-05-13 DIAGNOSIS — Z8673 Personal history of transient ischemic attack (TIA), and cerebral infarction without residual deficits: Secondary | ICD-10-CM | POA: Insufficient documentation

## 2017-05-13 DIAGNOSIS — N183 Chronic kidney disease, stage 3 (moderate): Secondary | ICD-10-CM | POA: Diagnosis not present

## 2017-05-13 DIAGNOSIS — Z7902 Long term (current) use of antithrombotics/antiplatelets: Secondary | ICD-10-CM | POA: Insufficient documentation

## 2017-05-13 DIAGNOSIS — M79641 Pain in right hand: Secondary | ICD-10-CM

## 2017-05-13 DIAGNOSIS — Z87891 Personal history of nicotine dependence: Secondary | ICD-10-CM | POA: Diagnosis not present

## 2017-05-13 DIAGNOSIS — Z79899 Other long term (current) drug therapy: Secondary | ICD-10-CM | POA: Insufficient documentation

## 2017-05-13 DIAGNOSIS — I2510 Atherosclerotic heart disease of native coronary artery without angina pectoris: Secondary | ICD-10-CM | POA: Insufficient documentation

## 2017-05-13 DIAGNOSIS — Z7901 Long term (current) use of anticoagulants: Secondary | ICD-10-CM | POA: Insufficient documentation

## 2017-05-13 DIAGNOSIS — Z951 Presence of aortocoronary bypass graft: Secondary | ICD-10-CM | POA: Insufficient documentation

## 2017-05-13 DIAGNOSIS — M79644 Pain in right finger(s): Secondary | ICD-10-CM | POA: Diagnosis present

## 2017-05-13 DIAGNOSIS — I129 Hypertensive chronic kidney disease with stage 1 through stage 4 chronic kidney disease, or unspecified chronic kidney disease: Secondary | ICD-10-CM | POA: Insufficient documentation

## 2017-05-13 NOTE — ED Provider Notes (Signed)
[facility] EMERGENCY DEPARTMENT Provider Note   CSN: [id number] Arrival date & time: 05/13/17  2979     History   Chief Complaint Chief Complaint  Patient presents with  . Hand Pain    HPI Dylan Morgan is a 72 y.o. male.  HPI   Dylan Morgan is a 72 y.o. male, with a history of CAD chronic bradycardia, and HTN, presenting to the ED with right middle finger pain for the last 2 days.  Patient states he tripped and fell onto the hand.  He was seen yesterday in the ED and had a negative x-ray at that time.  He states that his pain persists, despite Tylenol and tramadol.  States he does not want stronger pain medicine.  Denies fever/chills, numbness, weakness, wrist pain, or any other complaints.   Past Medical History:  Diagnosis Date  . AAA (abdominal aortic aneurysm) ([place])    a. 2010 s/p stenting  . Anemia   . Arthritis    "pretty much all over" (09/06/2016)  . Blind left eye    "explosion knocked hole in retina" (09/06/2016)  . Bradycardia   . CAD (coronary artery disease) CABG in 2004   a. 2004 s/p CABG. b. NSTEMI 09/2016 -> cath with occ VG-distal Cx, managed medically.  . Carotid artery disease ([place])    a. 2012 dopplers with old LICA occlusion , RICA no significant abnormality  . Chronic lower back pain   . CKD (chronic kidney disease), stage III ([place])   . CVA (cerebral vascular accident) [facility])    a. 2013 R Carona radiata stroke   . GERD (gastroesophageal reflux disease)   . History of blood transfusion 2004   "related to OHS"  . Hyperlipidemia   . Hypertension   . Paroxysmal atrial fibrillation ([place]) 02/22/2009   a. on Coumadin   . Pre-diabetes   . PVD (peripheral vascular disease) ([place])    a. ?R subclavian stenosis by CT 09/2016.  Marland Kitchen Seborrheic dermatitis of scalp   . Thrombocytopenia ([place])   . Tobacco abuse     Patient Active Problem List   Diagnosis Date Noted  . Generalized body aches 05/08/2017  . Acute otitis externa of left ear  04/04/2017  . Hyperkalemia 01/12/2017  . Thoracic aortic aneurysm ([place]) 09/10/2016  . Chronic obstructive pulmonary disease ([place])   . Vitamin B 12 deficiency 05/02/2016  . Closed fracture of left tibial plateau 01/18/2016  . Carpal tunnel syndrome, bilateral 01/11/2016  . S/P AAA (abdominal aortic aneurysm) repair 06/21/2014  . Peripheral neuropathy 01/16/2014  . Onychomycosis 01/10/2013  . Chronic anticoagulation 05/01/2012  . Occlusion and stenosis of carotid artery without mention of cerebral infarction 12/19/2011  . TIA (transient ischemic attack) 11/23/2011  . Preventative health care 09/20/2011  . CVA (cerebral infarction)   . Prediabetes   . Long term (current) use of anticoagulants 08/04/2011  . Carotid artery disease ([place])   . CAD (coronary artery disease)   . Hypertension   . Hyperlipidemia   . Hx of CABG   . AAA (abdominal aortic aneurysm) ([place])   . Erectile dysfunction 02/06/2011  . Paroxysmal atrial fibrillation ([place]) 02/22/2009  . Sleep apnea 02/04/2007  . CKD (chronic kidney disease), stage III ([place]) 02/20/2006    Past Surgical History:  Procedure Laterality Date  . ABDOMINAL AORTIC ANEURYSM REPAIR  2010   Aortic stent repair  . CATARACT EXTRACTION W/ INTRAOCULAR LENS  IMPLANT, BILATERAL    . CORONARY ARTERY BYPASS GRAFT  2004   "CABG X3"  . LEFT HEART CATH AND CORS/GRAFTS ANGIOGRAPHY N/A 09/08/2016   Procedure: Left Heart Cath and Cors/Grafts Angiography;  Surgeon: Troy Sine, MD;  Location: [facility] CV LAB;  Service: Cardiovascular;  Laterality: N/A;  . SHOULDER OPEN ROTATOR CUFF REPAIR Left 1990       Home Medications    Prior to Admission medications   Medication Sig Start Date End Date Taking? Authorizing Provider  albuterol (PROVENTIL HFA;VENTOLIN HFA) 108 (90 Base) MCG/ACT inhaler Inhale 2 puffs into the lungs every 6 (six) hours as needed for wheezing or shortness of breath. 05/08/17   Welford Roche, MD  atorvastatin (LIPITOR) 80 MG  tablet Take 1 tablet (80 mg total) by mouth daily at 6 PM. 01/18/17   Jettie Booze, MD  clopidogrel (PLAVIX) 75 MG tablet TAKE 1 TABLET BY MOUTH ONCE DAILY 01/17/17   Bartholomew Crews, MD  fenofibrate (TRICOR) 145 MG tablet Take 1 tablet (145 mg total) by mouth daily. 01/18/17   Jettie Booze, MD  fluticasone [facility]) 50 MCG/ACT nasal spray Place 2 sprays into both nostrils daily. 07/13/16   Jule Ser, DO  gabapentin (NEURONTIN) 300 MG capsule Take 1 capsule (300 mg total) by mouth 2 (two) times daily. 05/08/17 05/08/18  Welford Roche, MD  isosorbide mononitrate (IMDUR) 60 MG 24 hr tablet TAKE 1 TABLET BY MOUTH ONCE DAILY 03/29/17   Oda Kilts, MD  lisinopril (PRINIVIL,ZESTRIL) 5 MG tablet Take 1 tablet (5 mg total) by mouth daily. 10/03/16 10/03/17  Jettie Booze, MD  loratadine (CLARITIN) 10 MG tablet Take 1 tablet (10 mg total) by mouth daily. 07/13/16 07/13/17  Jule Ser, DO  magnesium oxide (MAG-OX) 400 (241.3 Mg) MG tablet TAKE 1 TABLET BY MOUTH ONCE DAILY 03/28/17   Dunn, Lisbeth Renshaw N, PA-C  magnesium oxide (MAG-OX) 400 MG tablet Take 1 tablet (400 mg total) by mouth daily. 09/20/16   Dunn, Nedra Hai, PA-C  metoprolol succinate (TOPROL-XL) 25 MG 24 hr tablet Take 1 tablet (25 mg total) by mouth daily. 01/23/17   Aldine Contes, MD  Multiple Vitamin (MULTIVITAMIN) tablet Take 1 tablet by mouth daily. 02/29/16   Riccardo Dubin, MD  oseltamivir (TAMIFLU) 30 MG capsule Take 1 capsule (30 mg total) by mouth daily for 5 days. 05/08/17 05/13/17  Welford Roche, MD  torsemide (DEMADEX) 20 MG tablet Take 1 tablet (20 mg total) by mouth daily. 01/23/17 04/23/17  Jettie Booze, MD  traMADol (ULTRAM) 50 MG tablet Take 1 tablet (50 mg total) by mouth every 6 (six) hours as needed. 05/11/17   Malvin Johns, MD  vitamin B-12 (CYANOCOBALAMIN) 1000 MCG tablet Take 1 tablet (1,000 mcg total) by mouth daily. 02/29/16   Riccardo Dubin, MD  warfarin (COUMADIN) 3  MG tablet Take as directed by the coumadin clinic 01/31/17   Constance Haw, MD    Family History Family History  Problem Relation Age of Onset  . Diabetes Mother   . Stroke Father   . Thyroid cancer Sister   . Heart disease Brother        Coronary artery disease  . Heart attack Brother   . Heart attack Brother     Social History Social History   Tobacco Use  . Smoking status: Former Smoker    Packs/day: 3.00    Years: 51.00    Pack years: 153.00    Types: Cigarettes    Last attempt to quit: 02/03/2010    Years since  quitting: 7.2  . Smokeless tobacco: Never Used  . Tobacco comment: "chewed tobacco 1-2 times'  Substance Use Topics  . Alcohol use: Yes    Alcohol/week: 0.0 oz    Comment: 09/06/2016 "nothing in a long while; used to drink q now and then"  . Drug use: Yes    Types: Marijuana    Comment: 09/06/2016 "stopped in 2002"     Allergies   Diltiazem hcl   Review of Systems Review of Systems  Constitutional: Negative for fever.  Musculoskeletal: Positive for arthralgias and joint swelling.  Skin: Negative for color change.  Neurological: Negative for weakness and numbness.     Physical Exam Updated Vital Signs BP 109/74   Pulse 87   Temp 97.6 F (36.4 C)   Resp 20   SpO2 99%   Physical Exam  Constitutional: He appears well-developed and well-nourished. No distress.  HENT:  Head: Normocephalic and atraumatic.  Eyes: Conjunctivae are normal.  Neck: Neck supple.  Cardiovascular: Normal rate, regular rhythm and intact distal pulses.  Pulmonary/Chest: Effort normal.  Musculoskeletal: He exhibits edema and tenderness.  Tenderness in swelling seems to be centered around the MCP joint of the right middle finger.  Motor function individually intact at the MCP, PIP, and DIP joints with both flexion and extension, though these movements are painful.  No increased warmth, erythema, or deformity noted. Some mild tenderness extending proximally into the  hand over the metacarpal.  Neurological: He is alert.  No sensory deficits noted. Abduction and adduction of the fingers intact against resistance. Grip strength equal bilaterally. Strength 5/5 through the cardinal directions of the bilateral wrists. Strength 5/5 with flexion and extension of the bilateral elbows. Patient can touch the thumb to each one of the fingertips.  Skin: Skin is warm and dry. Capillary refill takes less than 2 seconds. He is not diaphoretic. No pallor.  Psychiatric: He has a normal mood and affect. His behavior is normal.  Nursing note and vitals reviewed.    ED Treatments / Results  Labs (all labs ordered are listed, but only abnormal results are displayed) Labs Reviewed - No data to display  EKG  EKG Interpretation None       Radiology No results found.  Procedures Procedures (including critical care time)  Medications Ordered in ED Medications - No data to display   Initial Impression / Assessment and Plan / ED Course  I have reviewed the triage vital signs and the nursing notes.  Pertinent labs & imaging results that were available during my care of the patient were reviewed by me and considered in my medical decision making (see chart for details).     Patient presents with continued hand pain following a fall 2 days ago.  X-ray from yesterday showed no acute abnormalities.  This x-ray was again reviewed by myself and I do not see any acute abnormalities.  Doubt septic joint.  PCP versus hand surgery follow-up.  Patient fitted with a static finger splint.  The patient was given instructions for home care as well as return precautions. Patient voices understanding of these instructions, accepts the plan, and is comfortable with discharge.   Findings and plan of care discussed with Gara Kroner, MD. Dr. Laverta Baltimore personally evaluated and examined this patient.  Final Clinical Impressions(s) / ED Diagnoses   Final diagnoses:  Right hand pain     ED Discharge Orders    None       Lorayne Bender, PA-C 05/13/17 1305  Margette Fast, MD 05/13/17 1550

## 2017-05-13 NOTE — Progress Notes (Signed)
Orthopedic Tech Progress Note Patient Details:  Dylan Morgan June 28, 1945 [id number]  Ortho Devices Type of Ortho Device: Finger splint       [place] Pink 05/13/2017, 10:53 AM

## 2017-05-13 NOTE — ED Triage Notes (Signed)
patiemnt complains of ongoing right hand pain after being seen for same with negative xray

## 2017-05-13 NOTE — Discharge Instructions (Signed)
You have been seen today for a finger/hand injury. There were no acute abnormalities on the x-rays, including no sign of fracture or dislocation, however, there could be injuries to the soft tissues, such as the ligaments or tendons that are not seen on xrays. There could also be what are called occult fractures that are small fractures not seen on xray. Pain: May take tylenol or the prescribed tramadol for pain. Ice: May apply ice to the area over the next 24 hours for 15 minutes at a time to reduce swelling. Elevation: Keep the extremity elevated as often as possible to reduce pain and inflammation. Support: Wear the splint for support and comfort. Wear this until pain resolves.  Exercises: Start by performing these exercises a few times a week, increasing the frequency until you are performing them twice daily.  Follow up: If symptoms are improving, you may follow up with your primary care provider for any continued management. If symptoms are not improving, you may follow up with the orthopedic hand specialist.

## 2017-05-13 NOTE — Progress Notes (Deleted)
Cardiology Office Note   Date:  05/13/2017   ID:  Dylan Morgan, DOB Jul 07, 1945, MRN [id number]  PCP:  Aldine Contes, MD    No chief complaint on file.    Wt Readings from Last 3 Encounters:  05/11/17 234 lb (106.1 kg)  05/08/17 234 lb 12.8 oz (106.5 kg)  04/06/17 230 lb (104.3 kg)       History of Present Illness: Dylan Morgan is a 72 y.o. male  with a history of coronary artery disease. Bypass surgery in 2004. He does not remember distinct chest pain at that time, but he had fatigue and dizziness. No coronary PCI since that time. He also has peripheral arterial disease including a prior abdominal aortic aneurysm status post stenting in 2010. He has a known left internal carotid artery occlusion. His right internal carotid artery is patent. He has had a prior stroke with a history of paroxysmal atrial fibrillation. He is maintained on Coumadin for anticoagulation.  Prior back injury that has left him with chronic pain.  Cath in June 2018 done showing : Severe native CAD with coronary calcification and 90% stenosis in the LAD before the second diagonal vessel; total occlusion of the circumflex marginal vessel with 85% distal circumflex stenosis; and total occlusion of the mid RCA.  The LIMA graft supplying the mid LAD is patent.  The vein graft which had supplied a distal circumflex marginal has diffuse disease. There is segmental 90% stenosis in the proximal third of the graft followed by thrombotic stenosis of 90% in the midportion of the graft followed by at least 95% somewhat mobile thrombus in the distal third of the graft with TIMI 1 flow down the graft and faint visualization of the distal marginal vessel that supplies. I suspect there may have been distal embolization from the multiple thrombotic lesions.  Patent vein graft supplying the PDA of the RCA.  Coumadin was restarted post procedure.     He had a CT scan of the chest, abdomen, and pelvis when he  presented in June 2018 and this showed a 4.6 cm fusiform ascending aneurysm.  There was no aortic dissection.    Past Medical History:  Diagnosis Date  . AAA (abdominal aortic aneurysm) ([place])    a. 2010 s/p stenting  . Anemia   . Arthritis    "pretty much all over" (09/06/2016)  . Blind left eye    "explosion knocked hole in retina" (09/06/2016)  . Bradycardia   . CAD (coronary artery disease) CABG in 2004   a. 2004 s/p CABG. b. NSTEMI 09/2016 -> cath with occ VG-distal Cx, managed medically.  . Carotid artery disease ([place])    a. 2012 dopplers with old LICA occlusion , RICA no significant abnormality  . Chronic lower back pain   . CKD (chronic kidney disease), stage III ([place])   . CVA (cerebral vascular accident) [facility])    a. 2013 R Carona radiata stroke   . GERD (gastroesophageal reflux disease)   . History of blood transfusion 2004   "related to OHS"  . Hyperlipidemia   . Hypertension   . Paroxysmal atrial fibrillation ([place]) 02/22/2009   a. on Coumadin   . Pre-diabetes   . PVD (peripheral vascular disease) ([place])    a. ?R subclavian stenosis by CT 09/2016.  Marland Kitchen Seborrheic dermatitis of scalp   . Thrombocytopenia ([place])   . Tobacco abuse     Past Surgical History:  Procedure Laterality Date  . ABDOMINAL AORTIC ANEURYSM  REPAIR  2010   Aortic stent repair  . CATARACT EXTRACTION W/ INTRAOCULAR LENS  IMPLANT, BILATERAL    . CORONARY ARTERY BYPASS GRAFT  2004   "CABG X3"  . LEFT HEART CATH AND CORS/GRAFTS ANGIOGRAPHY N/A 09/08/2016   Procedure: Left Heart Cath and Cors/Grafts Angiography;  Surgeon: Troy Sine, MD;  Location: [facility] CV LAB;  Service: Cardiovascular;  Laterality: N/A;  . SHOULDER OPEN ROTATOR CUFF REPAIR Left 1990     Current Outpatient Medications  Medication Sig Dispense Refill  . albuterol (PROVENTIL HFA;VENTOLIN HFA) 108 (90 Base) MCG/ACT inhaler Inhale 2 puffs into the lungs every 6 (six) hours as needed for wheezing or shortness of breath. 1 Inhaler 0    . atorvastatin (LIPITOR) 80 MG tablet Take 1 tablet (80 mg total) by mouth daily at 6 PM. 90 tablet 1  . clopidogrel (PLAVIX) 75 MG tablet TAKE 1 TABLET BY MOUTH ONCE DAILY 90 tablet 3  . fenofibrate (TRICOR) 145 MG tablet Take 1 tablet (145 mg total) by mouth daily. 30 tablet 11  . fluticasone (FLONASE) 50 MCG/ACT nasal spray Place 2 sprays into both nostrils daily. 16 g 2  . gabapentin (NEURONTIN) 300 MG capsule Take 1 capsule (300 mg total) by mouth 2 (two) times daily. 60 capsule 1  . isosorbide mononitrate (IMDUR) 60 MG 24 hr tablet TAKE 1 TABLET BY MOUTH ONCE DAILY 90 tablet 0  . lisinopril (PRINIVIL,ZESTRIL) 5 MG tablet Take 1 tablet (5 mg total) by mouth daily. 90 tablet 3  . loratadine (CLARITIN) 10 MG tablet Take 1 tablet (10 mg total) by mouth daily. 30 tablet 2  . magnesium oxide (MAG-OX) 400 (241.3 Mg) MG tablet TAKE 1 TABLET BY MOUTH ONCE DAILY 90 tablet 3  . magnesium oxide (MAG-OX) 400 MG tablet Take 1 tablet (400 mg total) by mouth daily. 90 tablet 1  . metoprolol succinate (TOPROL-XL) 25 MG 24 hr tablet Take 1 tablet (25 mg total) by mouth daily. 90 tablet 3  . Multiple Vitamin (MULTIVITAMIN) tablet Take 1 tablet by mouth daily. 90 tablet 3  . oseltamivir (TAMIFLU) 30 MG capsule Take 1 capsule (30 mg total) by mouth daily for 5 days. 5 capsule 0  . torsemide (DEMADEX) 20 MG tablet Take 1 tablet (20 mg total) by mouth daily. 30 tablet 11  . traMADol (ULTRAM) 50 MG tablet Take 1 tablet (50 mg total) by mouth every 6 (six) hours as needed. 15 tablet 0  . vitamin B-12 (CYANOCOBALAMIN) 1000 MCG tablet Take 1 tablet (1,000 mcg total) by mouth daily. 90 tablet 0  . warfarin (COUMADIN) 3 MG tablet Take as directed by the coumadin clinic 40 tablet 1   No current facility-administered medications for this visit.     Allergies:   Diltiazem hcl    Social History:  The patient  reports that he quit smoking about 7 years ago. His smoking use included cigarettes. He has a 153.00  pack-year smoking history. he has never used smokeless tobacco. He reports that he drinks alcohol. He reports that he uses drugs. Drug: Marijuana.   Family History:  The patient's ***family history includes Diabetes in his mother; Heart attack in his brother and brother; Heart disease in his brother; Stroke in his father; Thyroid cancer in his sister.    ROS:  Please see the history of present illness.   Otherwise, review of systems are positive for ***.   All other systems are reviewed and negative.    PHYSICAL EXAM: VS:  There were no vitals taken for this visit. , BMI There is no height or weight on file to calculate BMI. GEN: Well nourished, well developed, in no acute distress  HEENT: normal  Neck: no JVD, carotid bruits, or masses Cardiac: ***RRR; no murmurs, rubs, or gallops,no edema  Respiratory:  clear to auscultation bilaterally, normal work of breathing GI: soft, nontender, nondistended, + BS MS: no deformity or atrophy  Skin: warm and dry, no rash Neuro:  Strength and sensation are intact Psych: euthymic mood, full affect   EKG:   The ekg ordered today demonstrates ***   Recent Labs: 07/08/2016: B Natriuretic Peptide 340.9 09/19/2016: Hemoglobin 12.3; Platelets 244 10/03/2016: Magnesium 1.7 03/30/2017: ALT 19 04/13/2017: BUN 24; Creatinine, Ser 1.55; Potassium 4.7; Sodium 139   Lipid Panel    Component Value Date/Time   CHOL 112 03/30/2017 0748   TRIG 187 (H) 03/30/2017 0748   HDL 36 (L) 03/30/2017 0748   CHOLHDL 3.1 03/30/2017 0748   CHOLHDL 3.0 11/26/2015 0751   VLDL 63 (H) 11/26/2015 0751   LDLCALC 39 03/30/2017 0748   LDLDIRECT 58 03/30/2017 0748   LDLDIRECT 46 11/24/2011 0915     Other studies Reviewed: Additional studies/ records that were reviewed today with results demonstrating: ***.   ASSESSMENT AND PLAN:  1. CAD:  2. Aortic aneurysm: 3. PAD: 4. Hyperlipidemia: 5. Obesity 6. Anticoagulated: h/o PAF in 2010.  Coumadin for stroke prevention.   7. Chronic diastolic heart failure:  Elevated LVEDP at the time of cath.  Lasix was started for volume overload.    Current medicines are reviewed at length with the patient today.  The patient concerns regarding his medicines were addressed.  The following changes have been made:  No change***  Labs/ tests ordered today include: *** No orders of the defined types were placed in this encounter.   Recommend 150 minutes/week of aerobic exercise Low fat, low carb, high fiber diet recommended  Disposition:   FU in ***   Signed, Larae Grooms, MD  05/13/2017 5:18 PM    [facility] [place], [place], [place]  [address] Phone: [phone]; Fax: [phone]

## 2017-05-15 ENCOUNTER — Ambulatory Visit: Payer: Medicare HMO | Admitting: Interventional Cardiology

## 2017-05-16 ENCOUNTER — Ambulatory Visit (INDEPENDENT_AMBULATORY_CARE_PROVIDER_SITE_OTHER): Payer: Medicare HMO | Admitting: Internal Medicine

## 2017-05-16 ENCOUNTER — Encounter: Payer: Self-pay | Admitting: Internal Medicine

## 2017-05-16 ENCOUNTER — Other Ambulatory Visit: Payer: Self-pay

## 2017-05-16 VITALS — BP 155/77 | HR 67 | Temp 98.0°F | Ht 71.0 in | Wt 237.9 lb

## 2017-05-16 DIAGNOSIS — Z7901 Long term (current) use of anticoagulants: Secondary | ICD-10-CM

## 2017-05-16 DIAGNOSIS — Z87891 Personal history of nicotine dependence: Secondary | ICD-10-CM | POA: Diagnosis not present

## 2017-05-16 DIAGNOSIS — I4891 Unspecified atrial fibrillation: Secondary | ICD-10-CM

## 2017-05-16 DIAGNOSIS — Z79899 Other long term (current) drug therapy: Secondary | ICD-10-CM | POA: Diagnosis not present

## 2017-05-16 DIAGNOSIS — R52 Pain, unspecified: Secondary | ICD-10-CM

## 2017-05-16 DIAGNOSIS — Z760 Encounter for issue of repeat prescription: Secondary | ICD-10-CM

## 2017-05-16 DIAGNOSIS — G629 Polyneuropathy, unspecified: Secondary | ICD-10-CM | POA: Diagnosis not present

## 2017-05-16 DIAGNOSIS — G609 Hereditary and idiopathic neuropathy, unspecified: Secondary | ICD-10-CM | POA: Diagnosis not present

## 2017-05-16 MED ORDER — WARFARIN SODIUM 3 MG PO TABS: ORAL_TABLET | ORAL | 0 refills | Status: DC

## 2017-05-16 NOTE — Assessment & Plan Note (Deleted)
He was prescribed Tamiflu during his previous visit for empiric treatment of influenza.  At present, he reports improvement in his symptoms.  His myalgia has improved.  He denies having any fevers, chills, or sore throat.  Noted to have mild end expiratory wheezing on exam.  He is a former smoker with a 34-pack-year smoking history, quit 8 years ago.  Prior chest x-ray showing increased AP diameter.  He may have COPD at baseline.  Plan -PFTs have been ordered -Continue albuterol inhaler as needed

## 2017-05-16 NOTE — Progress Notes (Signed)
   CC: Patient is requesting a refill on Coumadin.  HPI:  Dylan Morgan is a 72 y.o. male with a past medical history of conditions listed below presenting to the clinic requesting a refill on Coumadin.  Recent influenza infection and peripheral neuropathy were also discussed. Please see problem based charting for the status of the patient's current and chronic medical conditions.   Past Medical History:  Diagnosis Date  . AAA (abdominal aortic aneurysm) ([place])    a. 2010 s/p stenting  . Anemia   . Arthritis    "pretty much all over" (09/06/2016)  . Blind left eye    "explosion knocked hole in retina" (09/06/2016)  . Bradycardia   . CAD (coronary artery disease) CABG in 2004   a. 2004 s/p CABG. b. NSTEMI 09/2016 -> cath with occ VG-distal Cx, managed medically.  . Carotid artery disease ([place])    a. 2012 dopplers with old LICA occlusion , RICA no significant abnormality  . Chronic lower back pain   . CKD (chronic kidney disease), stage III ([place])   . CVA (cerebral vascular accident) [facility])    a. 2013 R Carona radiata stroke   . GERD (gastroesophageal reflux disease)   . History of blood transfusion 2004   "related to OHS"  . Hyperlipidemia   . Hypertension   . Paroxysmal atrial fibrillation ([place]) 02/22/2009   a. on Coumadin   . Pre-diabetes   . PVD (peripheral vascular disease) ([place])    a. ?R subclavian stenosis by CT 09/2016.  Marland Kitchen Seborrheic dermatitis of scalp   . Thrombocytopenia ([place])   . Tobacco abuse    Review of Systems: Pertinent positives mentioned in HPI. Remainder of all ROS negative.   Physical Exam:  Vitals:   05/16/17 0836  BP: (!) 155/77  Pulse: 67  Temp: 98 F (36.7 C)  TempSrc: Oral  SpO2: 97%  Weight: 237 lb 14.4 oz (107.9 kg)  Height: 5\' 11"  (1.803 m)   Physical Exam  Constitutional: He is oriented to person, place, and time. He appears well-developed and well-nourished. No distress.  HENT:  Head: Normocephalic and atraumatic.  Mouth/Throat:  Oropharynx is clear and moist.  Eyes: Right eye exhibits no discharge. Left eye exhibits no discharge.  Cardiovascular: Normal rate, regular rhythm and intact distal pulses.  Pulmonary/Chest: Effort normal. No respiratory distress. He has no rales.  Mild diffuse expiratory wheezing  Abdominal: Soft. Bowel sounds are normal. He exhibits no distension. There is no tenderness.  Musculoskeletal: He exhibits no edema.  Neurological: He is alert and oriented to person, place, and time.  Skin: Skin is warm and dry.    Assessment & Plan:   See Encounters Tab for problem based charting.  Patient seen with Dr. Dareen Piano

## 2017-05-16 NOTE — Patient Instructions (Signed)
Mr. Jawad it was nice meeting you today.  -Continue taking gabapentin for your neuropathy  -I have ordered a pulmonary function test of your lungs.  Our clinic will get it scheduled for you.  -Continue using albuterol inhaler as needed for wheezing and shortness of breath  -Coumadin has been refilled.  Please call and make an appointment with the Coumadin clinic at your earliest convenience.  FOLLOW-UP INSTRUCTIONS When: In 3 months For: Regular checkup with Dr. Dareen Piano  What to bring: Your medications

## 2017-05-16 NOTE — Assessment & Plan Note (Signed)
Patient has peripheral neuropathy of unknown etiology confirmed on prior EMG study.  Dose of gabapentin was increased during his previous visit.  At present, he reports improvement in his symptoms.  Plan -Continue gabapentin 300 mg twice daily -Recheck A1c as last reading was 5.7 in 2013 -Check B12 and TSH level

## 2017-05-16 NOTE — Assessment & Plan Note (Signed)
He was prescribed Tamiflu during his previous visit for empiric treatment of influenza.  At present, he reports improvement in his symptoms.  His myalgia has improved.  He denies having any fevers, chills, or sore throat.  Noted to have mild end expiratory wheezing on exam.  He is a former smoker with a 34-pack-year smoking history, quit 8 years ago.  Prior chest x-ray showing increased AP diameter.  He may have COPD at baseline.  Plan -PFTs have been ordered -Continue albuterol inhaler as needed

## 2017-05-16 NOTE — Assessment & Plan Note (Signed)
Patient is on chronic anticoagulation for A. fib.  He is requesting a refill on Coumadin.  INR checked on May 04, 2017 was therapeutic at 2.5.  He is being followed by the Coumadin clinic at his cardiologist's office.  Currently taking Coumadin 6 mg on Mondays and Fridays and 3 mg the rest of the week.  Plan -Coumadin has been refilled -Advised patient to make an appointment with Coumadin clinic soon for repeat INR check

## 2017-05-17 LAB — TSH: TSH: 1.02 u[IU]/mL (ref 0.450–4.500)

## 2017-05-17 LAB — HEMOGLOBIN A1C
Est. average glucose Bld gHb Est-mCnc: 111 mg/dL
Hgb A1c MFr Bld: 5.5 % (ref 4.8–5.6)

## 2017-05-17 LAB — VITAMIN B12: Vitamin B-12: 263 pg/mL (ref 232–1245)

## 2017-05-18 NOTE — Progress Notes (Signed)
Internal Medicine Clinic Attending  I saw and evaluated the patient.  I personally confirmed the key portions of the history and exam documented by Dr. Rathore and I reviewed pertinent patient test results.  The assessment, diagnosis, and plan were formulated together and I agree with the documentation in the resident's note.  

## 2017-05-24 ENCOUNTER — Ambulatory Visit (INDEPENDENT_AMBULATORY_CARE_PROVIDER_SITE_OTHER): Payer: Medicare HMO | Admitting: *Deleted

## 2017-05-24 ENCOUNTER — Encounter: Payer: Self-pay | Admitting: Interventional Cardiology

## 2017-05-24 DIAGNOSIS — I480 Paroxysmal atrial fibrillation: Secondary | ICD-10-CM

## 2017-05-24 DIAGNOSIS — Z5181 Encounter for therapeutic drug level monitoring: Secondary | ICD-10-CM | POA: Diagnosis not present

## 2017-05-24 DIAGNOSIS — G459 Transient cerebral ischemic attack, unspecified: Secondary | ICD-10-CM | POA: Diagnosis not present

## 2017-05-24 LAB — POCT INR: INR: 2.4

## 2017-05-24 NOTE — Patient Instructions (Signed)
Description   Continue taking 1 tablet everyday except 2 tablets on Mondays and Fridays. Recheck in 4 weeks.  Please call with any changes in medications or new medications or if scheduled for any procedures [phone]

## 2017-05-28 ENCOUNTER — Ambulatory Visit (HOSPITAL_COMMUNITY)
Admission: RE | Admit: 2017-05-28 | Discharge: 2017-05-28 | Disposition: A | Discharge status: Home / Self Care | Payer: Medicare HMO | Source: Ambulatory Visit | Attending: Internal Medicine | Admitting: Internal Medicine

## 2017-05-28 DIAGNOSIS — R52 Pain, unspecified: Secondary | ICD-10-CM | POA: Diagnosis not present

## 2017-05-28 DIAGNOSIS — J449 Chronic obstructive pulmonary disease, unspecified: Secondary | ICD-10-CM | POA: Insufficient documentation

## 2017-05-28 DIAGNOSIS — R05 Cough: Secondary | ICD-10-CM | POA: Insufficient documentation

## 2017-05-28 LAB — PULMONARY FUNCTION TEST
DL/VA % pred: 78 %
DL/VA: 3.38 ml/min/mmHg/L
DLCO UNC % PRED: 46 %
DLCO unc: 12.49 ml/min/mmHg
FEF 25-75 POST: 0.69 L/s
FEF 25-75 PRE: 0.95 L/s
FEF2575-%CHANGE-POST: -27 %
FEF2575-%Pred-Post: 36 %
FEF2575-%Pred-Pre: 49 %
FEV1-%CHANGE-POST: -16 %
FEV1-%PRED-PRE: 54 %
FEV1-%Pred-Post: 45 %
FEV1-Post: 1.19 L
FEV1-Pre: 1.42 L
FEV1FVC-%CHANGE-POST: -4 %
FEV1FVC-%PRED-PRE: 91 %
FEV6-%CHANGE-POST: -11 %
FEV6-%Pred-Post: 55 %
FEV6-%Pred-Pre: 62 %
FEV6-PRE: 2.13 L
FEV6-Post: 1.88 L
FEV6FVC-%Change-Post: 0 %
FEV6FVC-%PRED-PRE: 107 %
FEV6FVC-%Pred-Post: 107 %
FVC-%CHANGE-POST: -11 %
FVC-%PRED-POST: 51 %
FVC-%Pred-Pre: 58 %
FVC-Post: 1.88 L
FVC-Pre: 2.13 L
POST FEV1/FVC RATIO: 63 %
PRE FEV6/FVC RATIO: 100 %
Post FEV6/FVC ratio: 100 %
Pre FEV1/FVC ratio: 67 %

## 2017-05-28 MED ORDER — ALBUTEROL SULFATE (2.5 MG/3ML) 0.083% IN NEBU
2.5000 mg | INHALATION_SOLUTION | Freq: Once | RESPIRATORY_TRACT | Status: AC
  Administered 2017-05-28: 2.5 mg via RESPIRATORY_TRACT

## 2017-06-07 ENCOUNTER — Encounter: Payer: Self-pay | Admitting: Internal Medicine

## 2017-06-07 ENCOUNTER — Encounter (INDEPENDENT_AMBULATORY_CARE_PROVIDER_SITE_OTHER): Payer: Self-pay

## 2017-06-07 ENCOUNTER — Ambulatory Visit (INDEPENDENT_AMBULATORY_CARE_PROVIDER_SITE_OTHER): Payer: Medicare HMO | Admitting: Internal Medicine

## 2017-06-07 ENCOUNTER — Ambulatory Visit (HOSPITAL_COMMUNITY)
Admission: RE | Admit: 2017-06-07 | Discharge: 2017-06-07 | Disposition: A | Discharge status: Home / Self Care | Payer: Medicare HMO | Source: Ambulatory Visit | Attending: Student in an Organized Health Care Education/Training Program | Admitting: Student in an Organized Health Care Education/Training Program

## 2017-06-07 VITALS — BP 130/67 | HR 63 | Temp 97.5°F | Ht 71.0 in | Wt 242.0 lb

## 2017-06-07 DIAGNOSIS — J441 Chronic obstructive pulmonary disease with (acute) exacerbation: Secondary | ICD-10-CM | POA: Diagnosis not present

## 2017-06-07 DIAGNOSIS — Z951 Presence of aortocoronary bypass graft: Secondary | ICD-10-CM | POA: Insufficient documentation

## 2017-06-07 DIAGNOSIS — I517 Cardiomegaly: Secondary | ICD-10-CM

## 2017-06-07 DIAGNOSIS — Z9889 Other specified postprocedural states: Secondary | ICD-10-CM | POA: Diagnosis not present

## 2017-06-07 DIAGNOSIS — R0602 Shortness of breath: Secondary | ICD-10-CM | POA: Diagnosis not present

## 2017-06-07 DIAGNOSIS — R05 Cough: Secondary | ICD-10-CM | POA: Diagnosis not present

## 2017-06-07 MED ORDER — PREDNISONE 20 MG PO TABS: 40.0000 mg | ORAL_TABLET | Freq: Every day | ORAL | 0 refills | Status: AC

## 2017-06-07 MED ORDER — ALBUTEROL SULFATE HFA 108 (90 BASE) MCG/ACT IN AERS: 2.0000 | INHALATION_SPRAY | Freq: Four times a day (QID) | RESPIRATORY_TRACT | 0 refills | Status: DC | PRN

## 2017-06-07 MED ORDER — AZITHROMYCIN 250 MG PO TABS: ORAL_TABLET | ORAL | 0 refills | Status: AC

## 2017-06-07 NOTE — Assessment & Plan Note (Addendum)
Patient presents with 3 days of productive cough (intially brownish, now clear/white sputum); Rhinorrhea; and Intermitted dyspnea (relieved with albuterol). He has tried tylenol and Nyquil with some relief, but has a persistent cough. He denies fevers, chill, chest pain, or body aches at this time. He recently had PFTs which show FEV1/FVC 67, and FEV1 54% of predicted consistent with GOLD stage 2 COPD. On exam patient has expiratory diffuse wheezing. The are some rhonchi at the LLL.  Patient with cough increased sputum production and wheezing in the setting of COPD. Patient is in exacerbation likely secondary to viral illness. - Prednisone 40mg  Daily x5d - Azithromycin 500mg  day1, 250mg  day2-5 - Refill Albuterol q8h PRN - Chest Xray to rule out pneumonia - Supportive care and nasal rinses for viral URI - Return in 2 weeks to begin treatments of baseline COPD  ADDENDUM: CXR personally reviewed. Good inspiration, penetration and rotation. Enlarged heart. S/P CABG. Increased AP diameter with flattening of diaphragm. No infiltrate or other acute abnormality noted.

## 2017-06-07 NOTE — Patient Instructions (Addendum)
Thank you for allowing [place] to care for you   For your cough and shortness of breath: - This is due to exacerbation of COPD by a viral illness - We are getting a chest xray to evaluate for any pneumonia - Please take 5 days of prednisone and 5 days of Azithromycin as prescribed  If you have continued congestion, you can try an sinus rinse  Please return in about 2 weeks for reevaluation and COPD treatment    Sinus Rinse What is a sinus rinse? A sinus rinse is a home treatment. It rinses your sinuses with a mixture of salt and water (saline solution). Sinuses are air-filled spaces in your skull behind the bones of your face and forehead. They open into your nasal cavity. To do a sinus rinse, you will need:  Saline solution.  Neti pot or spray bottle. This releases the saline solution into your nose and through your sinuses. You can buy neti pots and spray bottles at: ? [employer]. ? A health food store. ? Online.  When should I do a sinus rinse? A sinus rinse can help to clear your nasal cavity. It can clear:  Mucus.  Dirt.  Dust.  Pollen.  You may do a sinus rinse when you have:  A cold.  A virus.  Allergies.  A sinus infection.  A stuffy nose.  If you are considering a sinus rinse:  Ask your child's doctor before doing a sinus rinse on your child.  Do not do a sinus rinse if you have had: ? Ear or nasal surgery. ? An ear infection. ? Blocked ears.  How do I do a sinus rinse?  Wash your hands.  Disinfect your device using the directions that came with the device.  Dry your device.  Use the solution that comes with your device or one that is sold separately in stores. Follow the mixing directions on the package.  Fill your device with the amount of saline solution as stated in the device instructions.  Stand over a sink and tilt your head sideways over the sink.  Place the spout of the device in your upper nostril (the one closer to the  ceiling).  Gently pour or squeeze the saline solution into the nasal cavity. The liquid should drain to the lower nostril if you are not too congested.  Gently blow your nose. Blowing too hard may cause ear pain.  Repeat in the other nostril.  Clean and rinse your device with clean water.  Air-dry your device. Are there risks of a sinus rinse? Sinus rinse is normally very safe and helpful. However, there are a few risks, which include:  A burning feeling in the sinuses. This may happen if you do not make the saline solution as instructed. Make sure to follow all directions when making the saline solution.  Infection from unclean water. This is rare, but possible.  Nasal irritation.  This information is not intended to replace advice given to you by your health care provider. Make sure you discuss any questions you have with your health care provider. Document Released: 10/15/2013 Document Revised: 02/15/2016 Document Reviewed: 08/05/2013 Elsevier Interactive Patient Education  2017 [employer].
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
## 2017-06-08 NOTE — Addendum Note (Signed)
Addended by: Lalla Brothers T on: 06/08/2017 01:47 PM   Modules accepted: Level of Service

## 2017-06-08 NOTE — Progress Notes (Signed)
Internal Medicine Clinic Attending  I saw and evaluated the patient.  I personally confirmed the key portions of the history and exam documented by Dr. Trilby Drummer and I reviewed pertinent patient test results.  The assessment, diagnosis, and plan were formulated together and I agree with the documentation in the resident's note.

## 2017-06-19 ENCOUNTER — Other Ambulatory Visit: Payer: Self-pay

## 2017-06-19 ENCOUNTER — Ambulatory Visit (INDEPENDENT_AMBULATORY_CARE_PROVIDER_SITE_OTHER): Payer: Medicare HMO | Admitting: Internal Medicine

## 2017-06-19 DIAGNOSIS — R0981 Nasal congestion: Secondary | ICD-10-CM | POA: Insufficient documentation

## 2017-06-19 DIAGNOSIS — J449 Chronic obstructive pulmonary disease, unspecified: Secondary | ICD-10-CM

## 2017-06-19 DIAGNOSIS — Z951 Presence of aortocoronary bypass graft: Secondary | ICD-10-CM | POA: Diagnosis not present

## 2017-06-19 DIAGNOSIS — J3489 Other specified disorders of nose and nasal sinuses: Secondary | ICD-10-CM | POA: Diagnosis not present

## 2017-06-19 DIAGNOSIS — J029 Acute pharyngitis, unspecified: Secondary | ICD-10-CM

## 2017-06-19 DIAGNOSIS — J441 Chronic obstructive pulmonary disease with (acute) exacerbation: Secondary | ICD-10-CM

## 2017-06-19 DIAGNOSIS — R51 Headache: Secondary | ICD-10-CM | POA: Diagnosis not present

## 2017-06-19 MED ORDER — ALBUTEROL SULFATE HFA 108 (90 BASE) MCG/ACT IN AERS: 2.0000 | INHALATION_SPRAY | Freq: Four times a day (QID) | RESPIRATORY_TRACT | 0 refills | Status: DC | PRN
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
## 2017-06-19 NOTE — Assessment & Plan Note (Signed)
Patient was seen on 3/7 and treated for COPD exacerbation with steroids and azithromycin for 5 days.  CXR at that time showed no infiltrate or other abnormality.  He reports improved symptoms after treatment for his COPD.  About 3-4 days ago he reports onset of sinus drainage, congestion, headache, and sore throat.  His symptoms are worse at night.  He reports no known sick contacts. He denies fever, chills, cough, shortness of breath during this time.  Has tried Tylenol and Nyquil without benefit.  Continues to use his albuterol PRN, Flonase nightly and Claritin daily.  Plan: - Suspect either a viral URI or seasonal allergic rhinitis.  Currently not having lower respiratory tract symptoms. Oxygen saturation is 99% on room air.  He is afebrile. - Recommended continued supportive care with liberal saline irrigation and temporary short term use of Benadryl or Chlorpheniramine antihistamine if needed - Continue Flonase nightly and Albuterol PRN - RTC if needed.

## 2017-06-19 NOTE — Patient Instructions (Signed)
FOLLOW-UP INSTRUCTIONS When: in May with Dr Dareen Piano For: routine follow up What to bring:  Medications  For your sinuses, please use over the counter saline nasal irrigation or Neti pot as much as you would like.  Continue to use your flonase spray at night.  For the next few days you can try a stronger antihistamine like benadryl or chlorpheniramine.  Both can be purchased over the counter.  I have refilled your albuterol inhaler.  Let us know if you are not improving.

## 2017-06-20 NOTE — Progress Notes (Signed)
Internal Medicine Clinic Attending  Case discussed with Dr. Wallace at the time of the visit.  We reviewed the resident's history and exam and pertinent patient test results.  I agree with the assessment, diagnosis, and plan of care documented in the resident's note.  

## 2017-06-21 ENCOUNTER — Ambulatory Visit (INDEPENDENT_AMBULATORY_CARE_PROVIDER_SITE_OTHER): Payer: Medicare HMO | Admitting: *Deleted

## 2017-06-21 DIAGNOSIS — I480 Paroxysmal atrial fibrillation: Secondary | ICD-10-CM | POA: Diagnosis not present

## 2017-06-21 DIAGNOSIS — Z5181 Encounter for therapeutic drug level monitoring: Secondary | ICD-10-CM

## 2017-06-21 LAB — POCT INR: INR: 1.4

## 2017-06-21 NOTE — Patient Instructions (Signed)
Description   Today take 1.5 tablets and tomorrow take 2.5 tablets then continue taking 1 tablet everyday except 2 tablets on Mondays and Fridays. Recheck in 1 week.  Please call with any changes in medications or new medications or if scheduled for any procedures [phone]

## 2017-06-29 ENCOUNTER — Ambulatory Visit (INDEPENDENT_AMBULATORY_CARE_PROVIDER_SITE_OTHER): Payer: Medicare HMO | Admitting: *Deleted

## 2017-06-29 DIAGNOSIS — I480 Paroxysmal atrial fibrillation: Secondary | ICD-10-CM

## 2017-06-29 DIAGNOSIS — Z5181 Encounter for therapeutic drug level monitoring: Secondary | ICD-10-CM

## 2017-06-29 LAB — POCT INR: INR: 2.4

## 2017-06-29 NOTE — Patient Instructions (Signed)
Description   Continue taking 1 tablet everyday except 2 tablets on Mondays and Fridays. Recheck in 3 weeks.  Please call with any changes in medications or new medications or if scheduled for any procedures [phone]

## 2017-07-09 ENCOUNTER — Other Ambulatory Visit: Payer: Self-pay | Admitting: Internal Medicine

## 2017-07-20 ENCOUNTER — Ambulatory Visit (INDEPENDENT_AMBULATORY_CARE_PROVIDER_SITE_OTHER): Payer: Medicare HMO

## 2017-07-20 DIAGNOSIS — I480 Paroxysmal atrial fibrillation: Secondary | ICD-10-CM

## 2017-07-20 DIAGNOSIS — Z5181 Encounter for therapeutic drug level monitoring: Secondary | ICD-10-CM | POA: Diagnosis not present

## 2017-07-20 LAB — POCT INR: INR: 1.8

## 2017-07-20 NOTE — Patient Instructions (Signed)
Description   Take 2 tablets today and tomorrow, then resume same dosage 1 tablet everyday except 2 tablets on Mondays and Fridays. Recheck in 3 weeks.  Please call with any changes in medications or new medications or if scheduled for any procedures [phone]

## 2017-08-08 ENCOUNTER — Other Ambulatory Visit: Payer: Self-pay | Admitting: Internal Medicine

## 2017-08-10 ENCOUNTER — Ambulatory Visit (INDEPENDENT_AMBULATORY_CARE_PROVIDER_SITE_OTHER): Payer: Medicare HMO

## 2017-08-10 DIAGNOSIS — Z5181 Encounter for therapeutic drug level monitoring: Secondary | ICD-10-CM

## 2017-08-10 DIAGNOSIS — I480 Paroxysmal atrial fibrillation: Secondary | ICD-10-CM

## 2017-08-10 LAB — POCT INR: INR: 1.6

## 2017-08-10 NOTE — Patient Instructions (Signed)
Description   Take 3 tablets today, then start taking 1 tablet everyday except 2 tablets on Mondays, Wednesdays, and Fridays. Recheck in 2 weeks.  Please call with any changes in medications or new medications or if scheduled for any procedures [phone]

## 2017-08-21 ENCOUNTER — Ambulatory Visit: Payer: Medicare HMO | Admitting: Internal Medicine

## 2017-08-22 ENCOUNTER — Other Ambulatory Visit: Payer: Self-pay | Admitting: *Deleted

## 2017-08-22 ENCOUNTER — Other Ambulatory Visit: Payer: Self-pay | Admitting: Cardiology

## 2017-08-24 ENCOUNTER — Ambulatory Visit (INDEPENDENT_AMBULATORY_CARE_PROVIDER_SITE_OTHER): Payer: Medicare HMO | Admitting: Pharmacist

## 2017-08-24 ENCOUNTER — Encounter (INDEPENDENT_AMBULATORY_CARE_PROVIDER_SITE_OTHER): Payer: Self-pay

## 2017-08-24 DIAGNOSIS — Z5181 Encounter for therapeutic drug level monitoring: Secondary | ICD-10-CM | POA: Diagnosis not present

## 2017-08-24 DIAGNOSIS — I480 Paroxysmal atrial fibrillation: Secondary | ICD-10-CM

## 2017-08-24 LAB — POCT INR: INR: 2.4 (ref 2.0–3.0)

## 2017-08-24 NOTE — Patient Instructions (Signed)
Continue taking 1 tablet everyday except 2 tablets on Mondays, Wednesdays, and Fridays. Recheck in 2 weeks.  Please call with any changes in medications or new medications or if scheduled for any procedures [phone]

## 2017-08-31 ENCOUNTER — Ambulatory Visit: Payer: Medicare HMO | Admitting: Family

## 2017-08-31 ENCOUNTER — Other Ambulatory Visit (HOSPITAL_COMMUNITY): Payer: Medicare HMO

## 2017-08-31 ENCOUNTER — Encounter (HOSPITAL_COMMUNITY): Payer: Medicare HMO

## 2017-09-07 ENCOUNTER — Ambulatory Visit (INDEPENDENT_AMBULATORY_CARE_PROVIDER_SITE_OTHER): Payer: Medicare HMO | Admitting: Pharmacist

## 2017-09-07 DIAGNOSIS — I480 Paroxysmal atrial fibrillation: Secondary | ICD-10-CM

## 2017-09-07 DIAGNOSIS — Z5181 Encounter for therapeutic drug level monitoring: Secondary | ICD-10-CM

## 2017-09-07 LAB — POCT INR: INR: 2.1 (ref 2.0–3.0)

## 2017-09-07 NOTE — Patient Instructions (Signed)
Description   Continue taking 1 tablet everyday except 2 tablets on Mondays, Wednesdays, and Fridays. Recheck in 3 weeks.  Please call with any changes in medications or new medications or if scheduled for any procedures [phone]

## 2017-09-11 ENCOUNTER — Encounter: Payer: Self-pay | Admitting: Family

## 2017-09-11 ENCOUNTER — Ambulatory Visit (INDEPENDENT_AMBULATORY_CARE_PROVIDER_SITE_OTHER)
Admission: RE | Admit: 2017-09-11 | Discharge: 2017-09-11 | Disposition: A | Discharge status: Home / Self Care | Payer: Medicare HMO | Source: Ambulatory Visit | Attending: Vascular Surgery | Admitting: Vascular Surgery

## 2017-09-11 ENCOUNTER — Ambulatory Visit (INDEPENDENT_AMBULATORY_CARE_PROVIDER_SITE_OTHER): Payer: Medicare HMO | Admitting: Family

## 2017-09-11 ENCOUNTER — Ambulatory Visit (HOSPITAL_COMMUNITY)
Admission: RE | Admit: 2017-09-11 | Discharge: 2017-09-11 | Disposition: A | Discharge status: Home / Self Care | Payer: Medicare HMO | Source: Ambulatory Visit | Attending: Family | Admitting: Family

## 2017-09-11 VITALS — BP 149/75 | HR 71 | Temp 97.9°F | Resp 18 | Ht 71.0 in | Wt 236.4 lb

## 2017-09-11 DIAGNOSIS — I6521 Occlusion and stenosis of right carotid artery: Secondary | ICD-10-CM

## 2017-09-11 DIAGNOSIS — I10 Essential (primary) hypertension: Secondary | ICD-10-CM | POA: Insufficient documentation

## 2017-09-11 DIAGNOSIS — G459 Transient cerebral ischemic attack, unspecified: Secondary | ICD-10-CM

## 2017-09-11 DIAGNOSIS — I6522 Occlusion and stenosis of left carotid artery: Secondary | ICD-10-CM

## 2017-09-11 DIAGNOSIS — I6523 Occlusion and stenosis of bilateral carotid arteries: Secondary | ICD-10-CM | POA: Insufficient documentation

## 2017-09-11 DIAGNOSIS — I2510 Atherosclerotic heart disease of native coronary artery without angina pectoris: Secondary | ICD-10-CM | POA: Diagnosis not present

## 2017-09-11 DIAGNOSIS — I714 Abdominal aortic aneurysm, without rupture, unspecified: Secondary | ICD-10-CM

## 2017-09-11 DIAGNOSIS — I739 Peripheral vascular disease, unspecified: Principal | ICD-10-CM

## 2017-09-11 DIAGNOSIS — I779 Disorder of arteries and arterioles, unspecified: Secondary | ICD-10-CM

## 2017-09-11 DIAGNOSIS — Z95828 Presence of other vascular implants and grafts: Secondary | ICD-10-CM | POA: Diagnosis not present

## 2017-09-11 DIAGNOSIS — E785 Hyperlipidemia, unspecified: Secondary | ICD-10-CM | POA: Insufficient documentation

## 2017-09-11 NOTE — Patient Instructions (Addendum)
Before your next abdominal ultrasound:  Take two Extra-Strength Gas-X capsules at bedtime the night before the test. Take another two Extra-Strength Gas-X capsules 3 hours before the test.  Avoid gas forming foods and beverages the day before the test.        Stroke Prevention Some health problems and behaviors may make it more likely for you to have a stroke. Below are ways to lessen your risk of having a stroke.  Be active for at least 30 minutes on most or all days.  Do not smoke. Try not to be around others who smoke.  Do not drink too much alcohol. ? Do not have more than 2 drinks a day if you are a man. ? Do not have more than 1 drink a day if you are a woman and are not pregnant.  Eat healthy foods, such as fruits and vegetables. If you were put on a specific diet, follow the diet as told.  Keep your cholesterol levels under control through diet and medicines. Look for foods that are low in saturated fat, trans fat, cholesterol, and are high in fiber.  If you have diabetes, follow all diet plans and take your medicine as told.  Ask your doctor if you need treatment to lower your blood pressure. If you have high blood pressure (hypertension), follow all diet plans and take your medicine as told by your doctor.  If you are 50-51 years old, have your blood pressure checked every 3-5 years. If you are age 66 or older, have your blood pressure checked every year.  Keep a healthy weight. Eat foods that are low in calories, salt, saturated fat, trans fat, and cholesterol.  Do not take drugs.  Avoid birth control pills, if this applies. Talk to your doctor about the risks of taking birth control pills.  Talk to your doctor if you have sleep problems (sleep apnea).  Take all medicine as told by your doctor. ? You may be told to take aspirin or blood thinner medicine. Take this medicine as told by your doctor. ? Understand your medicine instructions.  Make sure any other  conditions you have are being taken care of.  Get help right away if:  You suddenly lose feeling (you feel numb) or have weakness in your face, arm, or leg.  Your face or eyelid hangs down to one side.  You suddenly feel confused.  You have trouble talking (aphasia) or understanding what people are saying.  You suddenly have trouble seeing in one or both eyes.  You suddenly have trouble walking.  You are dizzy.  You lose your balance or your movements are clumsy (uncoordinated).  You suddenly have a very bad headache and you do not know the cause.  You have new chest pain.  Your heart feels like it is fluttering or skipping a beat (irregular heartbeat). Do not wait to see if the symptoms above go away. Get help right away. Call your local emergency services (911 in [place] Do not drive yourself to the hospital. This information is not intended to replace advice given to you by your health care provider. Make sure you discuss any questions you have with your health care provider. Document Released: 09/19/2011 Document Revised: 08/26/2015 Document Reviewed: 09/20/2012 Elsevier Interactive Patient Education  [employer].
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
## 2017-09-20 ENCOUNTER — Other Ambulatory Visit: Payer: Self-pay | Admitting: Internal Medicine

## 2017-09-20 DIAGNOSIS — R52 Pain, unspecified: Secondary | ICD-10-CM

## 2017-09-28 ENCOUNTER — Ambulatory Visit (INDEPENDENT_AMBULATORY_CARE_PROVIDER_SITE_OTHER): Payer: Medicare HMO | Admitting: *Deleted

## 2017-09-28 DIAGNOSIS — I480 Paroxysmal atrial fibrillation: Secondary | ICD-10-CM | POA: Diagnosis not present

## 2017-09-28 DIAGNOSIS — Z5181 Encounter for therapeutic drug level monitoring: Secondary | ICD-10-CM

## 2017-09-28 LAB — POCT INR: INR: 1.8 — AB (ref 2.0–3.0)

## 2017-09-28 NOTE — Patient Instructions (Signed)
Description   Today take your normal 2 tablets, then start  taking 1 tablet everyday except 2 tablets on Mondays, Wednesdays, and Fridays. Recheck in 3 weeks.  Please call with any changes in medications or new medications or if scheduled for any procedures [phone]

## 2017-10-19 ENCOUNTER — Ambulatory Visit (INDEPENDENT_AMBULATORY_CARE_PROVIDER_SITE_OTHER): Payer: Medicare HMO | Admitting: Pharmacist

## 2017-10-19 DIAGNOSIS — Z5181 Encounter for therapeutic drug level monitoring: Secondary | ICD-10-CM

## 2017-10-19 DIAGNOSIS — I480 Paroxysmal atrial fibrillation: Secondary | ICD-10-CM

## 2017-10-19 LAB — POCT INR: INR: 1.5 — AB (ref 2.0–3.0)

## 2017-10-19 NOTE — Patient Instructions (Signed)
Description   Take 3 tablets today, take 2 tablets tomorrow  then continue 1 tablet everyday except 2 tablets on Mondays, Wednesdays, and Fridays. Recheck in 2 weeks.  Please call with any changes in medications or new medications or if scheduled for any procedures [phone]

## 2017-10-23 ENCOUNTER — Other Ambulatory Visit: Payer: Self-pay | Admitting: Interventional Cardiology

## 2017-10-28 ENCOUNTER — Other Ambulatory Visit: Payer: Self-pay | Admitting: Interventional Cardiology

## 2017-11-02 ENCOUNTER — Ambulatory Visit (INDEPENDENT_AMBULATORY_CARE_PROVIDER_SITE_OTHER): Payer: Medicare HMO | Admitting: *Deleted

## 2017-11-02 DIAGNOSIS — Z5181 Encounter for therapeutic drug level monitoring: Secondary | ICD-10-CM | POA: Diagnosis not present

## 2017-11-02 DIAGNOSIS — I480 Paroxysmal atrial fibrillation: Secondary | ICD-10-CM

## 2017-11-02 LAB — POCT INR: INR: 1.6 — AB (ref 2.0–3.0)

## 2017-11-02 MED ORDER — WARFARIN SODIUM 3 MG PO TABS: ORAL_TABLET | ORAL | 3 refills | Status: DC

## 2017-11-02 NOTE — Patient Instructions (Signed)
Description   Take 3 tablets today, change dose to 3 tablets daily except 2 tablets on Tuesday, Thursday, and Saturdays. Recheck in 2 weeks.  Please call with any changes in medications or new medications or if scheduled for any procedures [phone]

## 2017-11-06 ENCOUNTER — Ambulatory Visit: Payer: Medicare HMO | Admitting: Internal Medicine

## 2017-11-06 ENCOUNTER — Encounter: Payer: Self-pay | Admitting: Internal Medicine

## 2017-11-13 ENCOUNTER — Ambulatory Visit (INDEPENDENT_AMBULATORY_CARE_PROVIDER_SITE_OTHER): Payer: Medicare HMO

## 2017-11-13 DIAGNOSIS — I480 Paroxysmal atrial fibrillation: Secondary | ICD-10-CM

## 2017-11-13 DIAGNOSIS — Z5181 Encounter for therapeutic drug level monitoring: Secondary | ICD-10-CM | POA: Diagnosis not present

## 2017-11-13 LAB — POCT INR: INR: 3.2 — AB (ref 2.0–3.0)

## 2017-11-13 NOTE — Patient Instructions (Signed)
Description   Skip today's dosage of Coumadin, then start taking 1 tablet daily except 2 tablets on Mondays, Wednesdays and Fridays.  Recheck in 2-3 weeks.  Please call with any changes in medications or new medications or if scheduled for any procedures [phone]

## 2017-11-20 ENCOUNTER — Other Ambulatory Visit: Payer: Self-pay

## 2017-11-20 ENCOUNTER — Encounter: Payer: Self-pay | Admitting: Internal Medicine

## 2017-11-20 ENCOUNTER — Ambulatory Visit (INDEPENDENT_AMBULATORY_CARE_PROVIDER_SITE_OTHER): Payer: Medicare HMO | Admitting: Internal Medicine

## 2017-11-20 VITALS — BP 143/75 | HR 52 | Temp 97.5°F | Ht 71.0 in | Wt 233.0 lb

## 2017-11-20 DIAGNOSIS — I480 Paroxysmal atrial fibrillation: Secondary | ICD-10-CM

## 2017-11-20 DIAGNOSIS — E782 Mixed hyperlipidemia: Secondary | ICD-10-CM

## 2017-11-20 DIAGNOSIS — I10 Essential (primary) hypertension: Secondary | ICD-10-CM

## 2017-11-20 DIAGNOSIS — I712 Thoracic aortic aneurysm, without rupture, unspecified: Secondary | ICD-10-CM

## 2017-11-20 DIAGNOSIS — I714 Abdominal aortic aneurysm, without rupture, unspecified: Secondary | ICD-10-CM

## 2017-11-20 DIAGNOSIS — R7303 Prediabetes: Secondary | ICD-10-CM | POA: Diagnosis not present

## 2017-11-20 DIAGNOSIS — Z23 Encounter for immunization: Secondary | ICD-10-CM | POA: Diagnosis not present

## 2017-11-20 DIAGNOSIS — I779 Disorder of arteries and arterioles, unspecified: Secondary | ICD-10-CM | POA: Diagnosis not present

## 2017-11-20 DIAGNOSIS — E538 Deficiency of other specified B group vitamins: Secondary | ICD-10-CM

## 2017-11-20 DIAGNOSIS — J302 Other seasonal allergic rhinitis: Secondary | ICD-10-CM

## 2017-11-20 DIAGNOSIS — G629 Polyneuropathy, unspecified: Secondary | ICD-10-CM | POA: Diagnosis not present

## 2017-11-20 DIAGNOSIS — I6523 Occlusion and stenosis of bilateral carotid arteries: Secondary | ICD-10-CM

## 2017-11-20 DIAGNOSIS — E785 Hyperlipidemia, unspecified: Secondary | ICD-10-CM

## 2017-11-20 DIAGNOSIS — N183 Chronic kidney disease, stage 3 unspecified: Secondary | ICD-10-CM

## 2017-11-20 DIAGNOSIS — I129 Hypertensive chronic kidney disease with stage 1 through stage 4 chronic kidney disease, or unspecified chronic kidney disease: Secondary | ICD-10-CM

## 2017-11-20 DIAGNOSIS — Z0000 Encounter for general adult medical examination without abnormal findings: Secondary | ICD-10-CM

## 2017-11-20 DIAGNOSIS — Z79899 Other long term (current) drug therapy: Secondary | ICD-10-CM

## 2017-11-20 DIAGNOSIS — Z7901 Long term (current) use of anticoagulants: Secondary | ICD-10-CM

## 2017-11-20 LAB — POCT GLYCOSYLATED HEMOGLOBIN (HGB A1C): HEMOGLOBIN A1C: 5.6 % (ref 4.0–5.6)

## 2017-11-20 LAB — GLUCOSE, CAPILLARY: GLUCOSE-CAPILLARY: 105 mg/dL — AB (ref 70–99)

## 2017-11-20 MED ORDER — GABAPENTIN 300 MG PO CAPS: 300.0000 mg | ORAL_CAPSULE | Freq: Two times a day (BID) | ORAL | 1 refills | Status: DC

## 2017-11-20 MED ORDER — LORATADINE 10 MG PO TABS: 10.0000 mg | ORAL_TABLET | Freq: Every day | ORAL | 2 refills | Status: DC

## 2017-11-20 MED ORDER — ISOSORBIDE MONONITRATE ER 60 MG PO TB24: 60.0000 mg | ORAL_TABLET | Freq: Every day | ORAL | 1 refills | Status: DC

## 2017-11-20 NOTE — Assessment & Plan Note (Signed)
-  This problem is chronic and stable -We will continue with Toprol-XL for rate control -He appears to have a regular rhythm on exam today -Patient follows up in the Coumadin clinic at the cardiology office -Currently his heart rate is well controlled -No further work-up at this time

## 2017-11-20 NOTE — Assessment & Plan Note (Signed)
-  This problem is chronic and stable -Patient had a repeat carotid artery duplex earlier this year which was unchanged -Patient to follow-up with vascular surgery again next year -No further work-up at this time

## 2017-11-20 NOTE — Assessment & Plan Note (Signed)
BP Readings from Last 3 Encounters:  11/20/17 (!) 143/75  09/11/17 (!) 149/75  06/19/17 131/73    Lab Results  Component Value Date   NA 139 04/13/2017   K 4.7 04/13/2017   CREATININE 1.55 (H) 04/13/2017    Assessment: Blood pressure control:  Fair Progress toward BP goal:   Stable Comments: Patient is compliant with Toprol-XL 25 mg as well as Imdur 60 mg.  He is not taking his torsemide  Plan: Medications:  continue current medications Educational resources provided:   Self management tools provided:   Other plans: Patient to follow-up with his cardiologist in October.  We will check a BMP and a lipid panel today

## 2017-11-20 NOTE — Assessment & Plan Note (Signed)
-  Patient was noted to have thoracic aortic aneurysm of 4.6 cm on a CTA in June 2018 -He followed up with cardio thoracic surgery in December 2018.  No surgical intervention was required at that time -Patient to follow-up with cardio thoracic surgery again in December of this year with a repeat CT chest without contrast to evaluate his thoracic aortic aneurysm -No further work-up at this time

## 2017-11-20 NOTE — Assessment & Plan Note (Signed)
-  This problem is chronic and stable -Patient follow-up with vascular surgery earlier this year -His AAA decreased in size to 4.4 cm from 5.3 cm -He will follow-up with vascular surgery next year with repeat imaging -No further work-up at this time

## 2017-11-20 NOTE — Assessment & Plan Note (Addendum)
-  This problem is chronic and stable -Patient denies any urinary complaints -We will recheck his BMP today -No further work-up at this time  Addendum: -Patient's creatinine was noted to be at his baseline. -We will continue to monitor closely -No further work-up at this time

## 2017-11-20 NOTE — Assessment & Plan Note (Signed)
-  Patient to receive flu shot today -He will need his 41 Valent pneumonia shot on his follow-up visit -Fecal immunochemical testing card given to patient today.  He refuses colonoscopy at this time

## 2017-11-20 NOTE — Progress Notes (Signed)
   Subjective:    Patient ID: Dylan Morgan, male    DOB: 1945-05-31, 72 y.o.   MRN: [id number]  HPI  I have seen and examined this patient.  Patient is here for routine follow-up of his hypertension and peripheral neuropathy.  Patient complains of intermittent neuropathic pain in his feet.  He states that the gabapentin helps with the pain but he does not take it during the day as it makes him sleepy.  Patient denies any other complaints at this time.  He is compliant with most of his medications but does not take his torsemide or TriCor.   Review of Systems  Constitutional: Negative.   HENT: Negative.   Respiratory: Negative.   Cardiovascular: Negative.   Gastrointestinal: Negative.   Musculoskeletal: Negative.   Neurological: Positive for numbness. Negative for tremors, syncope, weakness and light-headedness.  Psychiatric/Behavioral: Negative.        Objective:   Physical Exam  Constitutional: He is oriented to person, place, and time. He appears well-developed and well-nourished.  HENT:  Head: Normocephalic and atraumatic.  Mouth/Throat: No oropharyngeal exudate.  Neck: Neck supple.  Cardiovascular: Normal rate, regular rhythm and normal heart sounds.  Pulmonary/Chest: Effort normal and breath sounds normal. No respiratory distress. He has no wheezes. He has no rales.  Abdominal: Soft. Bowel sounds are normal. He exhibits no distension. There is no tenderness.  Musculoskeletal: Normal range of motion. He exhibits edema.  Trace bilateral lower extremity edema noted  Lymphadenopathy:    He has no cervical adenopathy.  Neurological: He is alert and oriented to person, place, and time.  Psychiatric: He has a normal mood and affect. His behavior is normal.          Assessment & Plan:  Please see problem based charting for assessment and plan:

## 2017-11-20 NOTE — Assessment & Plan Note (Addendum)
-  This problem is chronic and stable -We will continue with atorvastatin 80 mg for now -Patient is not taking his TriCor. -We will follow-up his lipid panel today -No further work-up at this time  Addendum: -Patient noted to have an elevated triglyceride level of 425 -I talked to the patient with importance of lowering his triglyceride level -He is willing to take TriCor again.  I will prescribe this for him

## 2017-11-20 NOTE — Assessment & Plan Note (Addendum)
-  This problem is chronic and stable -Patient states that he is compliant with his B12 supplements -His last B12 level was low normal -We will recheck this today -No further work-up at this time  Addendum: -Patient was noted to have a low vitamin B12 level -I discussed this with him today.  Patient states that he is taking vitamin B12 but an over-the-counter pill and is unsure of how much vitamin B12 is in it. -I called in a prescription for vitamin B12 thousand micrograms and asked him to take 1 pill daily. -We will recheck his vitamin B12 level on follow-up.

## 2017-11-20 NOTE — Assessment & Plan Note (Signed)
-  This problem is chronic and stable -Patient states that Flonase did not help him but he takes Claritin for this which does relieve his symptoms -We will refill Claritin today and DC Flonase -No further work-up for now

## 2017-11-20 NOTE — Patient Instructions (Signed)
-  It was a pleasure seeing you today -I have called in refills of your medications today -We will get some blood work today.  I will call you with results -Please follow-up with your cardiologist -We will give you a flu shot today.  You need a pneumonia shot on follow-up -We will also give you stool card to take home and sent back today -Please call if you have any questions

## 2017-11-20 NOTE — Assessment & Plan Note (Addendum)
-  This problem is chronic and stable -Patient reports relief of symptoms with gabapentin -Patient states that he only takes 1 pill at night if needed but does not take it during the day as it makes him sleepy -He is complaining of some burning pain in his feet today -Explained to him the importance of taking his gabapentin if his symptoms worsen during the day -He was also cautioned against driving while taking gabapentin as it makes him sleepy -We will recheck his vitamin B12 level today -No further work-up at this time  Addendum: -Patient noted to have a low vitamin B12 level which may be contributing to his neuropathy -I have called in a prescription for vitamin B12 for him and we will recheck his on B12 level on his next visit -Patient states that he is unable to afford the gabapentin now.  I will discuss this with our pharmacist and see if there is another option available for him.

## 2017-11-20 NOTE — Assessment & Plan Note (Signed)
-  This problem is chronic and stable -Repeat A1c today was 5.6 -No further work-up at this time -We will recheck his A1c next year

## 2017-11-21 DIAGNOSIS — Z0000 Encounter for general adult medical examination without abnormal findings: Secondary | ICD-10-CM | POA: Diagnosis not present

## 2017-11-21 LAB — BMP8+ANION GAP
Anion Gap: 17 mmol/L (ref 10.0–18.0)
BUN / CREAT RATIO: 15 (ref 10–24)
BUN: 21 mg/dL (ref 8–27)
CO2: 19 mmol/L — ABNORMAL LOW (ref 20–29)
CREATININE: 1.37 mg/dL — AB (ref 0.76–1.27)
Calcium: 8.6 mg/dL (ref 8.6–10.2)
Chloride: 107 mmol/L — ABNORMAL HIGH (ref 96–106)
GFR, EST AFRICAN AMERICAN: 58 mL/min/{1.73_m2} — AB (ref >59)
GFR, EST NON AFRICAN AMERICAN: 50 mL/min/{1.73_m2} — AB (ref >59)
Glucose: 112 mg/dL — ABNORMAL HIGH (ref 65–99)
Potassium: 4.4 mmol/L (ref 3.5–5.2)
SODIUM: 143 mmol/L (ref 134–144)

## 2017-11-21 LAB — LIPID PANEL
CHOLESTEROL TOTAL: 129 mg/dL (ref 100–199)
Chol/HDL Ratio: 4.2 ratio (ref 0.0–5.0)
HDL: 31 mg/dL — AB (ref >39)
TRIGLYCERIDES: 425 mg/dL — AB (ref 0–149)

## 2017-11-21 LAB — VITAMIN B12: Vitamin B-12: 171 pg/mL — ABNORMAL LOW (ref 232–1245)

## 2017-11-21 MED ORDER — VITAMIN B-12 1000 MCG PO TABS: 1000.0000 ug | ORAL_TABLET | Freq: Every day | ORAL | 0 refills | Status: AC

## 2017-11-21 NOTE — Addendum Note (Signed)
Addended by: Aldine Contes on: 11/21/2017 10:47 AM   Modules accepted: Orders

## 2017-11-23 ENCOUNTER — Telehealth: Payer: Self-pay | Admitting: Pharmacist

## 2017-11-23 IMAGING — CR DG CHEST 2V
2 series · 2 of 2 positions shown · non-contrast
Comparison: 06/14/2015

CLINICAL DATA: Shortness of breath, dry cough, body aches for 4
days.

EXAM:
CHEST  2 VIEW

[w chest pa]
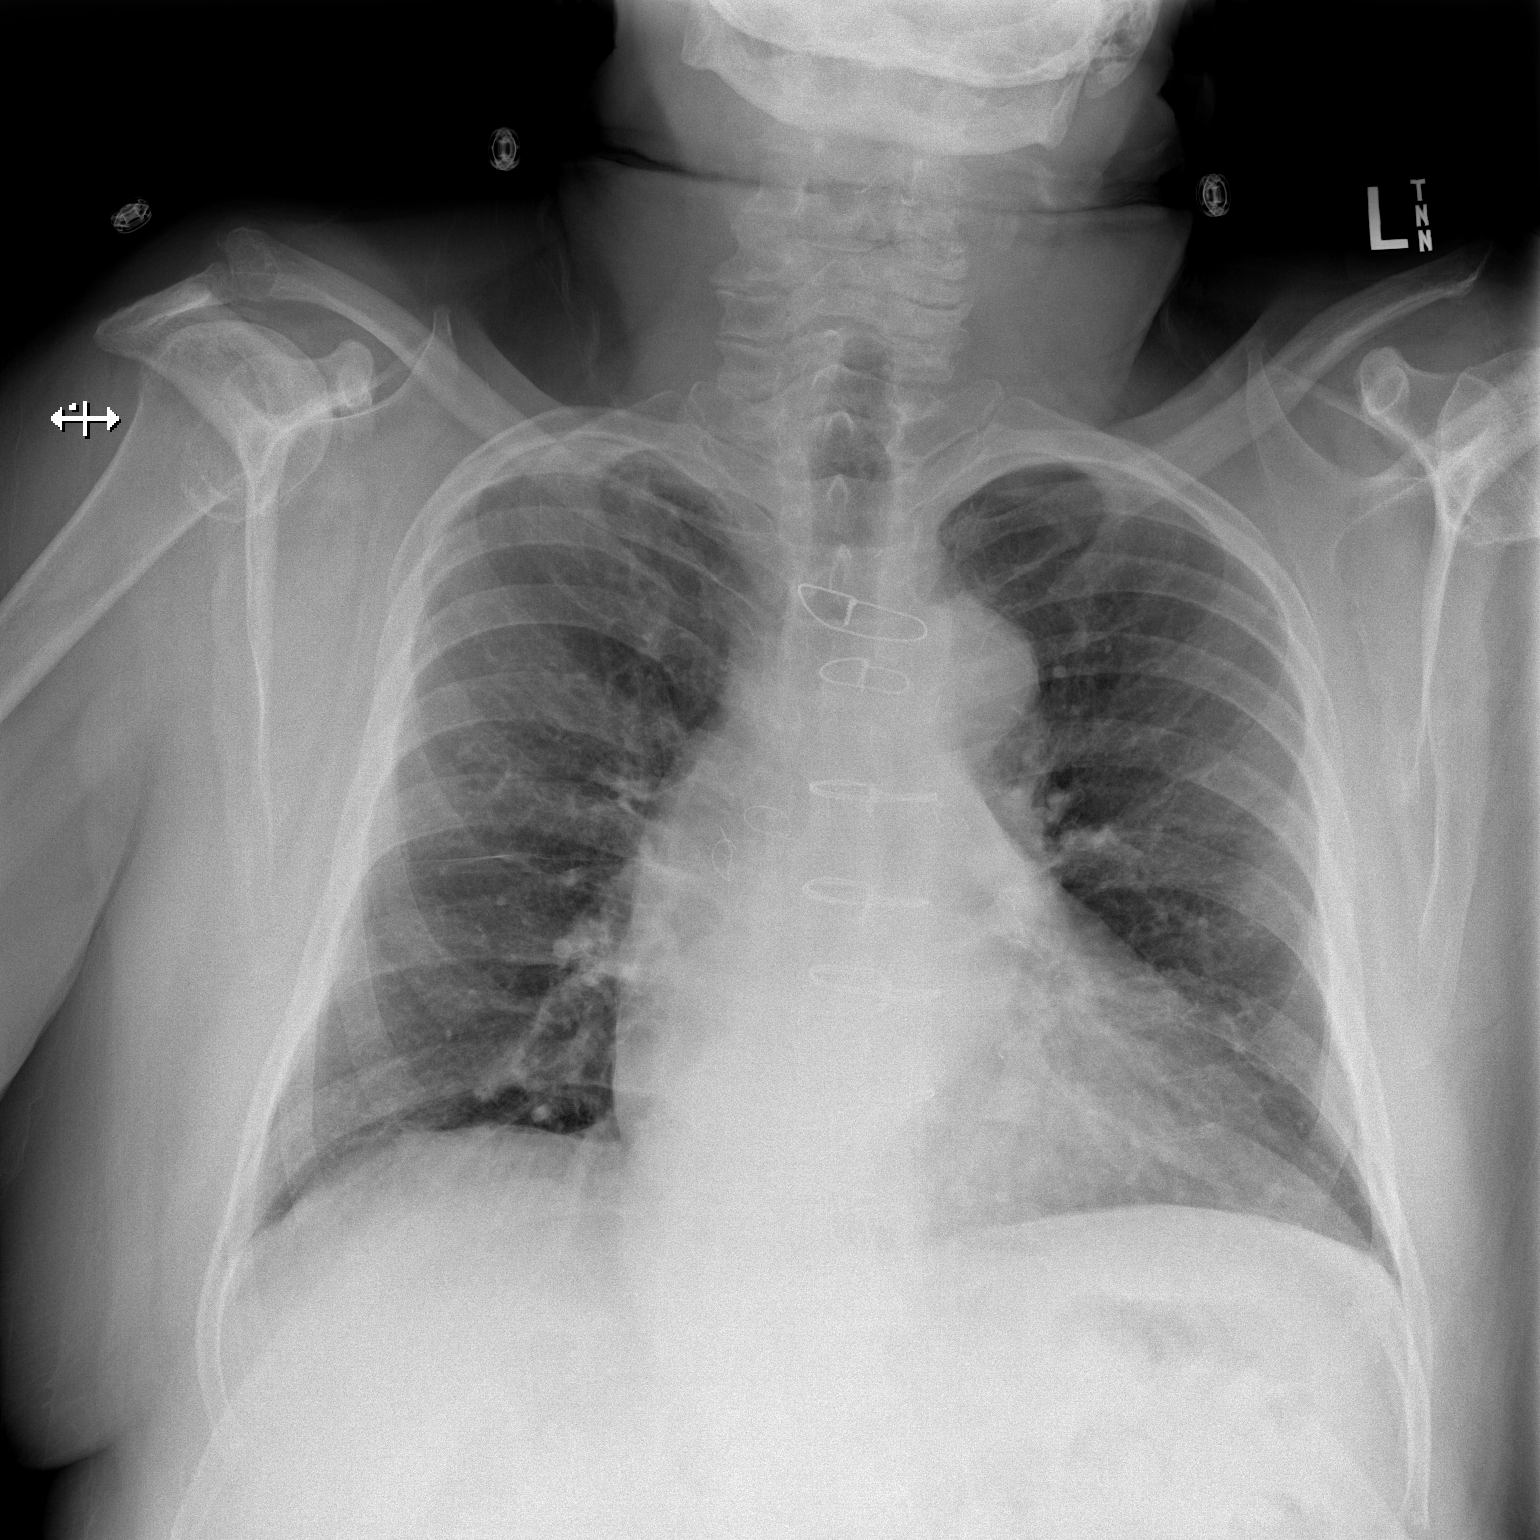

[w chest lat]
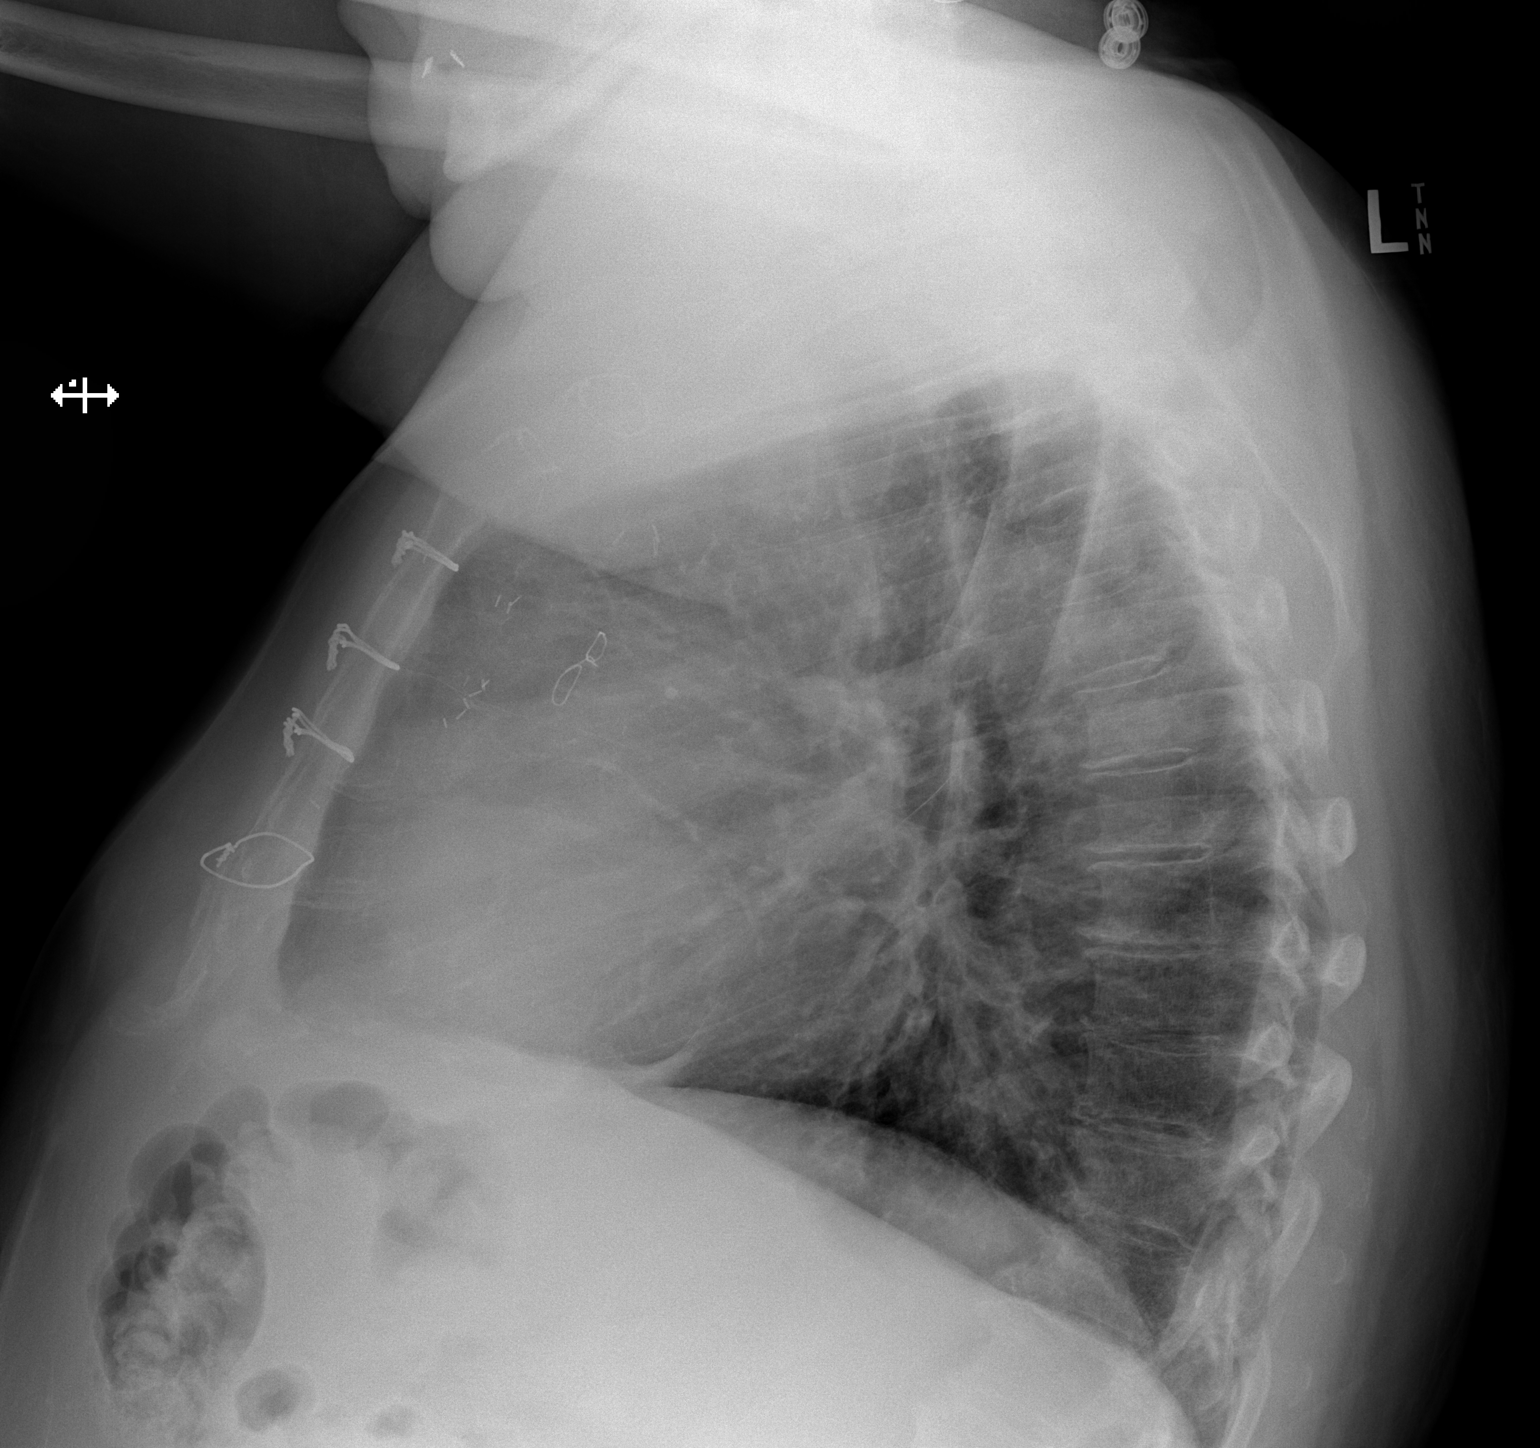

[2 of 2 positions shown; findings below may reference images not displayed]

FINDINGS: Prior CABG. There is cardiomegaly. Tortuosity of the thoracic aorta.
No confluent airspace opacities or effusions. No acute bony
abnormality.
IMPRESSION: Cardiomegaly.  Tortuous aorta.  No active disease.

## 2017-11-23 NOTE — Telephone Encounter (Signed)
Contacted patient's pharmacy and also spoke to patient regarding gabapentin copay. Pharmacy states that patient may be in the donut hole and would need to call his insurance company for further details.  I did recommend topical OTC agents for pain (lidocaine and capsaicin), and patient reports taking itamin B12 prescription as well. I also recommended to patient to try working with agent during Medicare re-enrollment this year to qualify for a supplement such as LIS/Extra Help or Medicaid for more medication copay support next year. Advised him to contact clinic if further concerns arise. Patient verbalized understanding by repeat back.

## 2017-11-27 ENCOUNTER — Other Ambulatory Visit: Payer: Medicare HMO

## 2017-11-27 DIAGNOSIS — Z0000 Encounter for general adult medical examination without abnormal findings: Secondary | ICD-10-CM

## 2017-11-29 LAB — FECAL OCCULT BLOOD, IMMUNOCHEMICAL: Fecal Occult Bld: NEGATIVE

## 2017-11-30 ENCOUNTER — Ambulatory Visit (INDEPENDENT_AMBULATORY_CARE_PROVIDER_SITE_OTHER): Payer: Medicare HMO

## 2017-11-30 DIAGNOSIS — I480 Paroxysmal atrial fibrillation: Secondary | ICD-10-CM

## 2017-11-30 DIAGNOSIS — Z5181 Encounter for therapeutic drug level monitoring: Secondary | ICD-10-CM

## 2017-11-30 LAB — POCT INR: INR: 2.7 (ref 2.0–3.0)

## 2017-11-30 NOTE — Patient Instructions (Signed)
Description   Continue taking 1 tablet daily except 2 tablets on Mondays, Wednesdays and Fridays.  Recheck in 3 weeks.  Please call with any changes in medications or new medications or if scheduled for any procedures [phone]

## 2017-12-21 ENCOUNTER — Ambulatory Visit (INDEPENDENT_AMBULATORY_CARE_PROVIDER_SITE_OTHER): Payer: Medicare HMO | Admitting: *Deleted

## 2017-12-21 DIAGNOSIS — Z5181 Encounter for therapeutic drug level monitoring: Secondary | ICD-10-CM

## 2017-12-21 DIAGNOSIS — I480 Paroxysmal atrial fibrillation: Secondary | ICD-10-CM | POA: Diagnosis not present

## 2017-12-21 LAB — POCT INR: INR: 1.9 — AB (ref 2.0–3.0)

## 2017-12-21 NOTE — Patient Instructions (Signed)
Description   Today take 2.5 tablets, then Continue taking 1 tablet daily except 2 tablets on Mondays, Wednesdays and Fridays.  Recheck in 2 weeks.  Please call with any changes in medications or new medications or if scheduled for any procedures [phone]

## 2018-01-04 ENCOUNTER — Ambulatory Visit (INDEPENDENT_AMBULATORY_CARE_PROVIDER_SITE_OTHER): Payer: Medicare HMO | Admitting: *Deleted

## 2018-01-04 DIAGNOSIS — I480 Paroxysmal atrial fibrillation: Secondary | ICD-10-CM

## 2018-01-04 DIAGNOSIS — Z5181 Encounter for therapeutic drug level monitoring: Secondary | ICD-10-CM | POA: Diagnosis not present

## 2018-01-04 LAB — POCT INR: INR: 1.8 — AB (ref 2.0–3.0)

## 2018-01-04 MED ORDER — WARFARIN SODIUM 3 MG PO TABS: ORAL_TABLET | ORAL | 3 refills | Status: DC

## 2018-01-04 NOTE — Patient Instructions (Signed)
Description   Today take 2.5 tablets, then start taking 2 tablets daily except 1 tablets on Tuesdays, Thursdays and Saturdays. Recheck in 2 weeks.  Please call with any changes in medications or new medications or if scheduled for any procedures [phone]

## 2018-01-07 ENCOUNTER — Other Ambulatory Visit: Payer: Self-pay | Admitting: Internal Medicine

## 2018-01-14 NOTE — Progress Notes (Signed)
Cardiology Office Note   Date:  01/15/2018   ID:  Dylan Morgan, DOB 18-Jan-1946, MRN [id number]  PCP:  Aldine Contes, MD    No chief complaint on file.  CAD  Wt Readings from Last 3 Encounters:  01/15/18 227 lb 1.9 oz (103 kg)  11/20/17 233 lb (105.7 kg)  09/11/17 236 lb 6.4 oz (107.2 kg)       History of Present Illness: Dylan Morgan is a 72 y.o. male  with a history of coronary artery disease. Bypass surgery in 2004. He does not remember distinct chest pain at that time, but he had fatigue and dizziness. No coronary PCI since that time. He also has peripheral arterial disease including a prior abdominal aortic aneurysm status post stenting in 2010. He has a known left internal carotid artery occlusion. His right internal carotid artery is patent. He has had a prior stroke with a history of paroxysmal atrial fibrillation. He is maintained on Coumadin for anticoagulation.  Prior back injury that has left him with chronic pain.  Cath in June 2018 done showing : Severe native CAD with coronary calcification and 90% stenosis in the LAD before the second diagonal vessel; total occlusion of the circumflex marginal vessel with 85% distal circumflex stenosis; and total occlusion of the mid RCA.  The LIMA graft supplying the mid LAD is patent.  The vein graft which had supplied a distal circumflex marginal has diffuse disease. There is segmental 90% stenosis in the proximal third of the graft followed by thrombotic stenosis of 90% in the midportion of the graft followed by at least 95% somewhat mobile thrombus in the distal third of the graft with TIMI 1 flow down the graft and faint visualization of the distal marginal vessel that supplies. I suspect there may have been distal embolization from the multiple thrombotic lesions.  Patent vein graft supplying the PDA of the RCA.  Coumadin was restarted post procedure.     Since the last visit, he states he is "lazy." He  reports a negative w/u for sleep apnea in the past.    Denies : Chest pain. Dizziness. Leg edema. Nitroglycerin use. Orthopnea. Palpitations. Paroxysmal nocturnal dyspnea. Shortness of breath. Syncope.   No bleeding problems.   He does walk occasionally.  He feels well with walking. Limited by back pain.  He has lost weight.  It is unintentional.  He feels that his appetite is still good.        Past Medical History:  Diagnosis Date  . AAA (abdominal aortic aneurysm) ([place])    a. 2010 s/p stenting  . Anemia   . Arthritis    "pretty much all over" (09/06/2016)  . Blind left eye    "explosion knocked hole in retina" (09/06/2016)  . Bradycardia   . CAD (coronary artery disease) CABG in 2004   a. 2004 s/p CABG. b. NSTEMI 09/2016 -> cath with occ VG-distal Cx, managed medically.  . Carotid artery disease ([place])    a. 2012 dopplers with old LICA occlusion , RICA no significant abnormality  . Chronic lower back pain   . CKD (chronic kidney disease), stage III ([place])   . CVA (cerebral vascular accident) [facility])    a. 2013 R Carona radiata stroke   . GERD (gastroesophageal reflux disease)   . History of blood transfusion 2004   "related to OHS"  . Hyperlipidemia   . Hypertension   . Paroxysmal atrial fibrillation ([place]) 02/22/2009   a. on Coumadin   .  Pre-diabetes   . PVD (peripheral vascular disease) ([place])    a. ?R subclavian stenosis by CT 09/2016.  Marland Kitchen Seborrheic dermatitis of scalp   . Thrombocytopenia ([place])   . Tobacco abuse     Past Surgical History:  Procedure Laterality Date  . ABDOMINAL AORTIC ANEURYSM REPAIR  2010   Aortic stent repair  . CATARACT EXTRACTION W/ INTRAOCULAR LENS  IMPLANT, BILATERAL    . CORONARY ARTERY BYPASS GRAFT  2004   "CABG X3"  . LEFT HEART CATH AND CORS/GRAFTS ANGIOGRAPHY N/A 09/08/2016   Procedure: Left Heart Cath and Cors/Grafts Angiography;  Surgeon: Troy Sine, MD;  Location: [facility];  Service: Cardiovascular;  Laterality: N/A;  .  SHOULDER OPEN ROTATOR CUFF REPAIR Left 1990     Current Outpatient Medications  Medication Sig Dispense Refill  . atorvastatin (LIPITOR) 80 MG tablet Take 1 tablet (80 mg total) by mouth daily. 90 tablet 0  . clopidogrel (PLAVIX) 75 MG tablet TAKE 1 TABLET BY MOUTH ONCE DAILY 90 tablet 1  . fenofibrate (TRICOR) 145 MG tablet Take 1 tablet (145 mg total) by mouth daily. 30 tablet 11  . gabapentin (NEURONTIN) 300 MG capsule Take 1 capsule (300 mg total) by mouth 2 (two) times daily. 60 capsule 1  . isosorbide mononitrate (IMDUR) 60 MG 24 hr tablet Take 1 tablet (60 mg total) by mouth daily. 90 tablet 1  . lisinopril (PRINIVIL,ZESTRIL) 5 MG tablet Take 1 tablet (5 mg total) by mouth daily. Please keep upcoming appt in October for future refills. Thank you 90 tablet 0  . loratadine (CLARITIN) 10 MG tablet Take 1 tablet (10 mg total) by mouth daily. 30 tablet 2  . metoprolol succinate (TOPROL-XL) 25 MG 24 hr tablet Take 1 tablet (25 mg total) by mouth daily. 90 tablet 3  . Multiple Vitamin (MULTIVITAMIN) tablet Take 1 tablet by mouth daily. 90 tablet 3  . vitamin B-12 (CYANOCOBALAMIN) 1000 MCG tablet Take 1 tablet (1,000 mcg total) by mouth daily. 90 tablet 0  . warfarin (COUMADIN) 3 MG tablet TAKE AS DIRECTED BY COUMADIN CLINIC 50 tablet 3  . torsemide (DEMADEX) 20 MG tablet Take 1 tablet (20 mg total) by mouth daily. 30 tablet 11   No current facility-administered medications for this visit.     Allergies:   Diltiazem hcl    Social History:  The patient  reports that he quit smoking about 7 years ago. His smoking use included cigarettes. He has a 153.00 pack-year smoking history. He has never used smokeless tobacco. He reports that he drinks alcohol. He reports that he has current or past drug history. Drug: Marijuana.   Family History:  The patient's family history includes Diabetes in his mother; Heart attack in his brother and brother; Heart disease in his brother; Stroke in his father;  Thyroid cancer in his sister.    ROS:  Please see the history of present illness.   Otherwise, review of systems are positive for weight loss.   All other systems are reviewed and negative.    PHYSICAL EXAM: VS:  BP 98/79   Pulse (!) 52   Ht 5\' 10"  (1.778 m)   Wt 227 lb 1.9 oz (103 kg)   SpO2 98%   BMI 32.59 kg/m  , BMI Body mass index is 32.59 kg/m. GEN: Well nourished, well developed, in no acute distress  HEENT: normal  Neck: no JVD, carotid bruits, or masses Cardiac: RRR, premature beats; no murmurs, rubs, or gallops,no edema  Respiratory:  clear to auscultation bilaterally, normal work of breathing GI: soft, nontender, nondistended, + BS MS: no deformity or atrophy  Skin: warm and dry, no rash Neuro:  Strength and sensation are intact Psych: euthymic mood, full affect   EKG:   The ekg ordered in Feb 2019 demonstrates NSR   Recent Labs: 03/30/2017: ALT 19 05/16/2017: TSH 1.020 11/20/2017: BUN 21; Creatinine, Ser 1.37; Potassium 4.4; Sodium 143   Lipid Panel    Component Value Date/Time   CHOL 129 11/20/2017 1051   TRIG 425 (H) 11/20/2017 1051   HDL 31 (L) 11/20/2017 1051   CHOLHDL 4.2 11/20/2017 1051   CHOLHDL 3.0 11/26/2015 0751   VLDL 63 (H) 11/26/2015 0751   LDLCALC Comment 11/20/2017 1051   LDLDIRECT 58 03/30/2017 0748   LDLDIRECT 46 11/24/2011 0915     Other studies Reviewed: Additional studies/ records that were reviewed today with results demonstrating: cath records and labs reviewed.   ASSESSMENT AND PLAN:  1. CAD: s/p CABG.  Angina controlled on medical therapy.  Thrombus, diffuse disease in the SVG to the OM. On anticoagulation. Tolerating clopidogrel. 2. Hyperlipidemia: TG elevated > 400 at last check.  Will refer to lipid clinic.  3. PAF: Appears to bein the normal rhythm. 4. HTN: The current medical regimen is effective;  continue present plan and medications. 5. Chronic diastolic heart failure: lasix started in 2018. Appears euvolemic  Cr  1.37 6. Anticoagulated: No bleeding issues.  Continue Coumadin clinic f/u. 7. He has had his flu shot.   Current medicines are reviewed at length with the patient today.  The patient concerns regarding his medicines were addressed.  The following changes have been made:  No change  Labs/ tests ordered today include:  No orders of the defined types were placed in this encounter.   Recommend 150 minutes/week of aerobic exercise Low fat, low carb, high fiber diet recommended  Disposition:   FU in 1 year   Signed, Larae Grooms, MD  01/15/2018 9:34 AM    [facility] [address], [place], [place]  [address] Phone: [phone]; Fax: [phone]

## 2018-01-15 ENCOUNTER — Ambulatory Visit (INDEPENDENT_AMBULATORY_CARE_PROVIDER_SITE_OTHER): Payer: Medicare HMO | Admitting: Interventional Cardiology

## 2018-01-15 ENCOUNTER — Ambulatory Visit (INDEPENDENT_AMBULATORY_CARE_PROVIDER_SITE_OTHER): Payer: Medicare HMO | Admitting: *Deleted

## 2018-01-15 ENCOUNTER — Telehealth: Payer: Self-pay | Admitting: Pharmacist

## 2018-01-15 ENCOUNTER — Encounter: Payer: Self-pay | Admitting: Interventional Cardiology

## 2018-01-15 VITALS — BP 98/79 | HR 52 | Ht 70.0 in | Wt 227.1 lb

## 2018-01-15 DIAGNOSIS — Z7901 Long term (current) use of anticoagulants: Secondary | ICD-10-CM

## 2018-01-15 DIAGNOSIS — I480 Paroxysmal atrial fibrillation: Secondary | ICD-10-CM | POA: Diagnosis not present

## 2018-01-15 DIAGNOSIS — E782 Mixed hyperlipidemia: Secondary | ICD-10-CM

## 2018-01-15 DIAGNOSIS — Z5181 Encounter for therapeutic drug level monitoring: Secondary | ICD-10-CM | POA: Diagnosis not present

## 2018-01-15 DIAGNOSIS — I10 Essential (primary) hypertension: Secondary | ICD-10-CM

## 2018-01-15 DIAGNOSIS — I25119 Atherosclerotic heart disease of native coronary artery with unspecified angina pectoris: Secondary | ICD-10-CM

## 2018-01-15 DIAGNOSIS — I5032 Chronic diastolic (congestive) heart failure: Secondary | ICD-10-CM

## 2018-01-15 LAB — POCT INR: INR: 3.9 — AB (ref 2.0–3.0)

## 2018-01-15 MED ORDER — ICOSAPENT ETHYL 1 G PO CAPS: 2.0000 g | ORAL_CAPSULE | Freq: Two times a day (BID) | ORAL | 1 refills | Status: DC

## 2018-01-15 NOTE — Patient Instructions (Signed)

## 2018-01-15 NOTE — Telephone Encounter (Signed)
-----   Message from Jettie Booze, MD sent at 01/15/2018  9:59 AM EDT ----- Georgina Peer, please look at his lipids.  TG had a sharp increase recently.  Thanks. JV

## 2018-01-15 NOTE — Telephone Encounter (Signed)
Called pt. Per our record he is taking atorvastatin 80mg  daily and fenofibrate 145mg  daily.   Spoke with pt and he reports that he has been eating more carbohydrates then usual. He states that he does not drink alcohol. He will work on cutting back on carbs, but is also willing to try prescription strength omega-3 fatty acids (will send for Vascepa, if cost prohibitive will change to Lovaza 2g BID).   Reordered labs for 3 months from now to assess TG and LDL.

## 2018-01-15 NOTE — Patient Instructions (Signed)
Description   Skip today's dose, then continue taking 2 tablets daily except 1 tablets on Tuesdays, Thursdays and Saturdays. Recheck in 2 weeks. Please return to eating your normal intake of leafy vegetables.  Please call with any changes in medications or new medications or if scheduled for any procedures [phone]

## 2018-01-28 ENCOUNTER — Other Ambulatory Visit: Payer: Self-pay | Admitting: Interventional Cardiology

## 2018-02-05 ENCOUNTER — Ambulatory Visit (INDEPENDENT_AMBULATORY_CARE_PROVIDER_SITE_OTHER): Payer: Medicare HMO | Admitting: *Deleted

## 2018-02-05 DIAGNOSIS — Z5181 Encounter for therapeutic drug level monitoring: Secondary | ICD-10-CM

## 2018-02-05 DIAGNOSIS — I480 Paroxysmal atrial fibrillation: Secondary | ICD-10-CM | POA: Diagnosis not present

## 2018-02-05 LAB — POCT INR: INR: 2 (ref 2.0–3.0)

## 2018-02-05 NOTE — Patient Instructions (Signed)
Description   Continue taking 2 tablets daily except 1 tablets on Tuesdays, Thursdays and Saturdays. Recheck in 3 weeks. Please return to eating your normal intake of leafy vegetables.  Please call with any changes in medications or new medications or if scheduled for any procedures [phone]

## 2018-02-11 ENCOUNTER — Other Ambulatory Visit: Payer: Self-pay | Admitting: Surgery

## 2018-02-11 DIAGNOSIS — I712 Thoracic aortic aneurysm, without rupture, unspecified: Secondary | ICD-10-CM

## 2018-02-26 ENCOUNTER — Ambulatory Visit (INDEPENDENT_AMBULATORY_CARE_PROVIDER_SITE_OTHER): Payer: Medicare HMO

## 2018-02-26 DIAGNOSIS — Z5181 Encounter for therapeutic drug level monitoring: Secondary | ICD-10-CM | POA: Diagnosis not present

## 2018-02-26 DIAGNOSIS — I480 Paroxysmal atrial fibrillation: Secondary | ICD-10-CM | POA: Diagnosis not present

## 2018-02-26 LAB — POCT INR: INR: 1.8 — AB (ref 2.0–3.0)

## 2018-02-26 NOTE — Patient Instructions (Signed)
Please take 2 tablets today, then START NEW DOSAGE of 2 tablets daily except 1 tablets on Tuesdays and Saturdays. Recheck in 3 weeks. Please return to eating your normal intake of leafy vegetables.  Please call with any changes in medications or new medications or if scheduled for any procedures [phone]

## 2018-02-28 ENCOUNTER — Other Ambulatory Visit: Payer: Self-pay

## 2018-02-28 ENCOUNTER — Encounter (HOSPITAL_COMMUNITY): Payer: Self-pay

## 2018-02-28 ENCOUNTER — Emergency Department (HOSPITAL_COMMUNITY)
Admission: EM | Admit: 2018-02-28 | Discharge: 2018-02-28 | Disposition: A | Discharge status: Home / Self Care | Payer: Medicare HMO | Attending: Emergency Medicine | Admitting: Emergency Medicine

## 2018-02-28 DIAGNOSIS — Z79899 Other long term (current) drug therapy: Secondary | ICD-10-CM | POA: Diagnosis not present

## 2018-02-28 DIAGNOSIS — L309 Dermatitis, unspecified: Secondary | ICD-10-CM | POA: Insufficient documentation

## 2018-02-28 DIAGNOSIS — N183 Chronic kidney disease, stage 3 (moderate): Secondary | ICD-10-CM | POA: Diagnosis not present

## 2018-02-28 DIAGNOSIS — Z87891 Personal history of nicotine dependence: Secondary | ICD-10-CM | POA: Diagnosis not present

## 2018-02-28 DIAGNOSIS — I129 Hypertensive chronic kidney disease with stage 1 through stage 4 chronic kidney disease, or unspecified chronic kidney disease: Secondary | ICD-10-CM | POA: Diagnosis not present

## 2018-02-28 DIAGNOSIS — R21 Rash and other nonspecific skin eruption: Secondary | ICD-10-CM | POA: Diagnosis not present

## 2018-02-28 MED ORDER — DIPHENHYDRAMINE HCL 25 MG PO TABS: 25.0000 mg | ORAL_TABLET | Freq: Four times a day (QID) | ORAL | 0 refills | Status: DC

## 2018-02-28 MED ORDER — DIPHENHYDRAMINE HCL 25 MG PO CAPS: 50.0000 mg | ORAL_CAPSULE | Freq: Once | ORAL | Status: DC

## 2018-02-28 MED ORDER — PREDNISONE 20 MG PO TABS
60.0000 mg | ORAL_TABLET | Freq: Once | ORAL | Status: AC
  Administered 2018-02-28: 60 mg via ORAL
  Filled 2018-02-28: qty 3

## 2018-02-28 MED ORDER — DIPHENHYDRAMINE HCL 25 MG PO CAPS
25.0000 mg | ORAL_CAPSULE | Freq: Once | ORAL | Status: AC
  Administered 2018-02-28: 25 mg via ORAL
  Filled 2018-02-28: qty 1

## 2018-02-28 MED ORDER — PREDNISONE 10 MG PO TABS: 30.0000 mg | ORAL_TABLET | Freq: Every day | ORAL | 0 refills | Status: DC

## 2018-02-28 MED ORDER — PREDNISONE 20 MG PO TABS: 60.0000 mg | ORAL_TABLET | Freq: Once | ORAL | Status: DC

## 2018-02-28 NOTE — ED Notes (Signed)
Bed bug found on pt.

## 2018-02-28 NOTE — ED Provider Notes (Signed)
[facility] EMERGENCY DEPARTMENT Provider Note   CSN: [id number] Arrival date & time: 02/28/18  0136     History   Chief Complaint Chief Complaint  Patient presents with  . Rash    HPI BENZ VANDENBERGHE is a 72 y.o. male.  HPI  72 year old male with history of AAA, coronary artery disease, paroxysmal A. fib and CVA comes in with chief complaint of rash.  According to history patient also has history of seborrheic dermatitis.  Patient states that he noted rash to his right side of the torso 3 days ago.  The rash is described as bumpy and itchy.  Patient has no history of similar rash in the past.  He denies any new exposures to chemicals or new products.  Patient has no known history of allergies.  He denies any lesions to his mouth.  According to the nurse, patient was found to have a bedbug when he was changing into gown.  Patient denies any insect infestation in his house that he is aware of.  Past Medical History:  Diagnosis Date  . AAA (abdominal aortic aneurysm) ([place])    a. 2010 s/p stenting  . Anemia   . Arthritis    "pretty much all over" (09/06/2016)  . Blind left eye    "explosion knocked hole in retina" (09/06/2016)  . Bradycardia   . CAD (coronary artery disease) CABG in 2004   a. 2004 s/p CABG. b. NSTEMI 09/2016 -> cath with occ VG-distal Cx, managed medically.  . Carotid artery disease ([place])    a. 2012 dopplers with old LICA occlusion , RICA no significant abnormality  . Chronic lower back pain   . CKD (chronic kidney disease), stage III ([place])   . CVA (cerebral vascular accident) [facility])    a. 2013 R Carona radiata stroke   . GERD (gastroesophageal reflux disease)   . History of blood transfusion 2004   "related to OHS"  . Hyperlipidemia   . Hypertension   . Paroxysmal atrial fibrillation ([place]) 02/22/2009   a. on Coumadin   . Pre-diabetes   . PVD (peripheral vascular disease) ([place])    a. ?R subclavian stenosis by CT 09/2016.  Marland Kitchen Seborrheic  dermatitis of scalp   . Thrombocytopenia ([place])   . Tobacco abuse     Patient Active Problem List   Diagnosis Date Noted  . Seasonal allergies 11/20/2017  . Thoracic aortic aneurysm ([place]) 09/10/2016  . Vitamin B 12 deficiency 05/02/2016  . Carpal tunnel syndrome, bilateral 01/11/2016  . S/P AAA (abdominal aortic aneurysm) repair 06/21/2014  . Peripheral neuropathy 01/16/2014  . Chronic anticoagulation 05/01/2012  . TIA (transient ischemic attack) 11/23/2011  . Preventative health care 09/20/2011  . CVA (cerebral infarction)   . Prediabetes   . Long term (current) use of anticoagulants 08/04/2011  . Carotid artery disease ([place])   . CAD (coronary artery disease)   . Hypertension   . Hyperlipidemia   . Hx of CABG   . AAA (abdominal aortic aneurysm) ([place])   . Erectile dysfunction 02/06/2011  . Paroxysmal atrial fibrillation ([facility]) 02/22/2009  . Sleep apnea 02/04/2007  . CKD (chronic kidney disease), stage III ([place]) 02/20/2006    Past Surgical History:  Procedure Laterality Date  . ABDOMINAL AORTIC ANEURYSM REPAIR  2010   Aortic stent repair  . CATARACT EXTRACTION W/ INTRAOCULAR LENS  IMPLANT, BILATERAL    . CORONARY ARTERY BYPASS GRAFT  2004   "CABG X3"  . LEFT HEART CATH AND CORS/GRAFTS  ANGIOGRAPHY N/A 09/08/2016   Procedure: Left Heart Cath and Cors/Grafts Angiography;  Surgeon: Troy Sine, MD;  Location: [facility];  Service: Cardiovascular;  Laterality: N/A;  . SHOULDER OPEN ROTATOR CUFF REPAIR Left 1990        Home Medications    Prior to Admission medications   Medication Sig Start Date End Date Taking? Authorizing Provider  atorvastatin (LIPITOR) 80 MG tablet Take 1 tablet (80 mg total) by mouth daily at 6 PM. 01/28/18   Jettie Booze, MD  clopidogrel (PLAVIX) 75 MG tablet TAKE 1 TABLET BY MOUTH ONCE DAILY 01/08/18   Aldine Contes, MD  fenofibrate (TRICOR) 145 MG tablet Take 1 tablet (145 mg total) by mouth daily. 01/18/17   Jettie Booze, MD  gabapentin (NEURONTIN) 300 MG capsule Take 1 capsule (300 mg total) by mouth 2 (two) times daily. 11/20/17   Aldine Contes, MD  Icosapent Ethyl (VASCEPA) 1 g CAPS Take 2 capsules (2 g total) by mouth 2 (two) times daily. 01/15/18   Jettie Booze, MD  isosorbide mononitrate (IMDUR) 60 MG 24 hr tablet Take 1 tablet (60 mg total) by mouth daily. 11/20/17   Aldine Contes, MD  lisinopril (PRINIVIL,ZESTRIL) 5 MG tablet Take 1 tablet (5 mg total) by mouth daily. 01/28/18   Jettie Booze, MD  loratadine (CLARITIN) 10 MG tablet Take 1 tablet (10 mg total) by mouth daily. 11/20/17 11/20/18  Aldine Contes, MD  metoprolol succinate (TOPROL-XL) 25 MG 24 hr tablet Take 1 tablet (25 mg total) by mouth daily. 01/23/17   Aldine Contes, MD  Multiple Vitamin (MULTIVITAMIN) tablet Take 1 tablet by mouth daily. 02/29/16   Riccardo Dubin, MD  torsemide (DEMADEX) 20 MG tablet Take 1 tablet (20 mg total) by mouth daily. 01/23/17 04/23/17  Jettie Booze, MD  vitamin B-12 (CYANOCOBALAMIN) 1000 MCG tablet Take 1 tablet (1,000 mcg total) by mouth daily. 11/21/17   Aldine Contes, MD  warfarin (COUMADIN) 3 MG tablet TAKE AS DIRECTED BY COUMADIN CLINIC 01/04/18   Camnitz, Ocie Doyne, MD    Family History Family History  Problem Relation Age of Onset  . Diabetes Mother   . Stroke Father   . Thyroid cancer Sister   . Heart disease Brother        Coronary artery disease  . Heart attack Brother   . Heart attack Brother     Social History Social History   Tobacco Use  . Smoking status: Former Smoker    Packs/day: 3.00    Years: 51.00    Pack years: 153.00    Types: Cigarettes    Last attempt to quit: 02/03/2010    Years since quitting: 8.0  . Smokeless tobacco: Never Used  . Tobacco comment: "chewed tobacco 1-2 times'  Substance Use Topics  . Alcohol use: Yes    Alcohol/week: 0.0 standard drinks    Comment: 09/06/2016 "nothing in a long while; used to drink q now and  then"  . Drug use: Yes    Types: Marijuana    Comment: 09/06/2016 "stopped in 2002"     Allergies   Diltiazem hcl   Review of Systems Review of Systems  Constitutional: Negative for fever.  Skin: Positive for rash.  Allergic/Immunologic: Negative for immunocompromised state.  Hematological: Does not bruise/bleed easily.     Physical Exam Updated Vital Signs BP (!) 125/91 (BP Location: Left Arm)   Pulse 63   Temp 97.7 F (36.5 C) (Oral)   Resp  16   Ht 5\' 10"  (1.778 m)   Wt 111.6 kg   SpO2 97%   BMI 35.30 kg/m   Physical Exam  Constitutional: He is oriented to person, place, and time. He appears well-developed.  HENT:  Head: Atraumatic.  Neck: Neck supple.  Cardiovascular: Normal rate.  Pulmonary/Chest: Effort normal.  Neurological: He is alert and oriented to person, place, and time.  Skin: Skin is warm. Rash noted.  Patient has a diffuse, erythematous plaque and papules.  No pustules, vesicles, crusting Oral mucosa is normal..  Nursing note and vitals reviewed.    ED Treatments / Results  Labs (all labs ordered are listed, but only abnormal results are displayed) Labs Reviewed - No data to display  EKG None  Radiology No results found.  Procedures Procedures (including critical care time)  Medications Ordered in ED Medications  diphenhydrAMINE (BENADRYL) capsule 25 mg (has no administration in time range)  predniSONE (DELTASONE) tablet 60 mg (has no administration in time range)     Initial Impression / Assessment and Plan / ED Course  I have reviewed the triage vital signs and the nursing notes.  Pertinent labs & imaging results that were available during my care of the patient were reviewed by me and considered in my medical decision making (see chart for details).     72 year old male comes in with chief complaint of rash to his torso.  He has a generalized rash over the right side of his upper chest and lower abdomen bilaterally.  The rash  is erythematous, raised and varies in size.  It appears to be contact dermatitis /seborrheic dermatitis. Since the area of the rash is so large, I think it might be better to put patient on systemic steroids for 3 to 5 days and have him follow-up with his PCP.  Final Clinical Impressions(s) / ED Diagnoses   Final diagnoses:  Dermatitis  Rash and nonspecific skin eruption    ED Discharge Orders    None       Varney Biles, MD 02/28/18 [phone]

## 2018-02-28 NOTE — Discharge Instructions (Signed)
We think that the rash you are having is a type of dermatitis.  Please take the prednisone as prescribed and follow-up with your PCP in 5 days. Return to the ER if the rash is getting worse.

## 2018-02-28 NOTE — ED Notes (Signed)
Patient verbalizes understanding of medications and discharge instructions. No further questions at this time. VSS and patient ambulatory at discharge.   

## 2018-02-28 NOTE — ED Triage Notes (Signed)
Pt arrives via POV for eval of R sided flank/ abd pain 2/2 rash onset 3 days ago. Pt reports "I think I have shingles cause it itches like crazy". Pt noted to have red, diffuse maculopapular rash to R side. Pt endorses pruritis and 4/10 pain over side. Pt denies hx of shingles or exposure.

## 2018-03-04 ENCOUNTER — Other Ambulatory Visit: Payer: Self-pay | Admitting: Internal Medicine

## 2018-03-12 ENCOUNTER — Other Ambulatory Visit: Payer: Self-pay

## 2018-03-12 ENCOUNTER — Encounter: Payer: Self-pay | Admitting: Internal Medicine

## 2018-03-12 ENCOUNTER — Ambulatory Visit (INDEPENDENT_AMBULATORY_CARE_PROVIDER_SITE_OTHER): Payer: Medicare HMO | Admitting: Internal Medicine

## 2018-03-12 VITALS — BP 153/66 | HR 51 | Temp 97.9°F | Ht 70.0 in | Wt 229.1 lb

## 2018-03-12 DIAGNOSIS — N183 Chronic kidney disease, stage 3 unspecified: Secondary | ICD-10-CM

## 2018-03-12 DIAGNOSIS — I712 Thoracic aortic aneurysm, without rupture, unspecified: Secondary | ICD-10-CM

## 2018-03-12 DIAGNOSIS — I10 Essential (primary) hypertension: Secondary | ICD-10-CM

## 2018-03-12 DIAGNOSIS — E538 Deficiency of other specified B group vitamins: Secondary | ICD-10-CM

## 2018-03-12 DIAGNOSIS — E782 Mixed hyperlipidemia: Secondary | ICD-10-CM

## 2018-03-12 DIAGNOSIS — I480 Paroxysmal atrial fibrillation: Secondary | ICD-10-CM

## 2018-03-12 DIAGNOSIS — Z0000 Encounter for general adult medical examination without abnormal findings: Secondary | ICD-10-CM

## 2018-03-12 DIAGNOSIS — Z23 Encounter for immunization: Secondary | ICD-10-CM | POA: Diagnosis not present

## 2018-03-12 DIAGNOSIS — N529 Male erectile dysfunction, unspecified: Secondary | ICD-10-CM | POA: Diagnosis not present

## 2018-03-12 DIAGNOSIS — G629 Polyneuropathy, unspecified: Secondary | ICD-10-CM

## 2018-03-12 MED ORDER — GABAPENTIN 300 MG PO CAPS: 300.0000 mg | ORAL_CAPSULE | Freq: Two times a day (BID) | ORAL | 1 refills | Status: DC

## 2018-03-12 NOTE — Addendum Note (Signed)
Addended by: Sander Nephew F on: 03/12/2018 10:14 AM   Modules accepted: Orders

## 2018-03-12 NOTE — Assessment & Plan Note (Signed)
-  This problem is chronic and stable -We will continue with vitamin B12 supplementation -Patient states that his neuropathy is improving in his feet and he only occasionally gets tingling numbness in his fingers -We will recheck a vitamin B12 level today

## 2018-03-12 NOTE — Assessment & Plan Note (Signed)
-  Patient was noted to have a thoracic aortic aneurysm of 4.6 cm on a CTA in June 2018 -He follow-up with cardiothoracic surgery December 2018 and no surgical intervention was recommended at that time -He is scheduled to follow-up with cardiothoracic surgery again this month as well as getting a repeat CT of his chest which he is scheduled for -No further work-up at this time -We will follow-up CT surgery recommendations

## 2018-03-12 NOTE — Assessment & Plan Note (Signed)
-  This problem is chronic and stable -Patient denies any palpitations or lightheadedness -He appears to be in normal sinus rhythm on exam today with a regular rate and rhythm -We will continue with metoprolol and Coumadin for now -No further work-up at this time

## 2018-03-12 NOTE — Patient Instructions (Signed)
-  Was a pleasure seeing you today -Have a great holiday season! -We will check some blood work on you today -We will give you a pneumonia shot today -I have refilled her gabapentin today -Please follow-up with the CT surgeon and get scan of your chest prior to the appointment -Please call your cardiologist to talk about alternatives for your high triglycerides given the high cost of the one who prescribed -Please call me if you have any questions

## 2018-03-12 NOTE — Assessment & Plan Note (Signed)
-  This problem is chronic and stable -Patient states that he only intermittently has tingling and numbness more so in his hands and his feet -Patient had an issue affording his gabapentin secondary to being in the donut hole of his insurance plan.  This should resolve in January. -We will check repeat vitamin B12 level today -Continue vitamin B12 supplementation -I have refilled gabapentin for now but he may not be able to fill until January

## 2018-03-12 NOTE — Assessment & Plan Note (Signed)
-  PCV 13 vaccine given today -We will need to address possible colonoscopy versus stool occult blood on follow-up visit

## 2018-03-12 NOTE — Assessment & Plan Note (Signed)
-  Patient was inquiring today about possible erectile dysfunction medication. -I explained to the patient that since he is on Imdur that it would be unsafe for [place] to prescribe him medications for erectile dysfunction -He expressed understanding of this -We will follow-up with him in more detail on his next visit

## 2018-03-12 NOTE — Assessment & Plan Note (Signed)
-  This problem is chronic and stable -We will continue with atorvastatin 80 mg for now -Patient was prescribed TriCor as well as Vascepa by his cardiologist but he is not taking these medications secondary to the cost -Patient will call his cardiologist and inquire about alternatives that he could use that are cheaper

## 2018-03-12 NOTE — Assessment & Plan Note (Signed)
BP Readings from Last 3 Encounters:  03/12/18 (!) 153/66  02/28/18 (!) 122/55  01/15/18 98/79    Lab Results  Component Value Date   NA 143 11/20/2017   K 4.4 11/20/2017   CREATININE 1.37 (H) 11/20/2017    Assessment: Blood pressure control:  Fair Progress toward BP goal:   Deteriorated Comments: Patient is compliant with Imdur 60 mg, metoprolol 25 mg and lisinopril 5 mg.  Plan: Medications:  continue current medications Educational resources provided:   Self management tools provided:   Other plans: We will check a BMP today

## 2018-03-12 NOTE — Assessment & Plan Note (Signed)
-  This problem is chronic and stable -We will recheck a BMP today -No further work-up at this time

## 2018-03-12 NOTE — Progress Notes (Signed)
   Subjective:    Patient ID: Dylan Morgan, male    DOB: [birth year], 72 y.o.   MRN: [id number]  HPI  I have seen and examined this patient.  Patient is here for routine follow-up of his hypertension and peripheral neuropathy.  Patient states that he feels well and denies any new complaints currently.  He did have a rash recently and was seen in the ED for this and was given steroids which has resolved this issue.  He states that he has been unable to afford some of his medications and we will discuss this today with him.  Review of Systems  Constitutional: Negative.   HENT: Negative.   Respiratory: Negative.   Cardiovascular: Negative.  Negative for chest pain, palpitations and leg swelling.  Gastrointestinal: Negative.   Musculoskeletal: Negative.   Neurological: Negative.   Psychiatric/Behavioral: Negative.        Objective:   Physical Exam  Constitutional: He is oriented to person, place, and time. He appears well-developed and well-nourished.  HENT:  Head: Normocephalic and atraumatic.  Mouth/Throat: No oropharyngeal exudate.  Eyes: Pupils are equal, round, and reactive to light. Right eye exhibits no discharge. Left eye exhibits no discharge. No scleral icterus.  Neck: Neck supple.  Cardiovascular: Normal rate, regular rhythm and normal heart sounds.  Pulmonary/Chest: Effort normal and breath sounds normal. He has no wheezes. He has no rales.  Abdominal: Soft. Bowel sounds are normal. He exhibits no distension. There is no tenderness.  Musculoskeletal: Normal range of motion. He exhibits no edema.  Lymphadenopathy:    He has no cervical adenopathy.  Neurological: He is alert and oriented to person, place, and time.  Psychiatric: He has a normal mood and affect. His behavior is normal.          Assessment & Plan:  Please see problem based charting for assessment and plan:

## 2018-03-13 ENCOUNTER — Encounter: Payer: Self-pay | Admitting: Internal Medicine

## 2018-03-14 LAB — BMP8+ANION GAP
ANION GAP: 18 mmol/L (ref 10.0–18.0)
BUN / CREAT RATIO: 17 (ref 10–24)
BUN: 24 mg/dL (ref 8–27)
CO2: 19 mmol/L — AB (ref 20–29)
Calcium: 9.2 mg/dL (ref 8.6–10.2)
Chloride: 103 mmol/L (ref 96–106)
Creatinine, Ser: 1.4 mg/dL — ABNORMAL HIGH (ref 0.76–1.27)
GFR calc Af Amer: 57 mL/min/{1.73_m2} — ABNORMAL LOW (ref >59)
GFR calc non Af Amer: 49 mL/min/{1.73_m2} — ABNORMAL LOW (ref >59)
Glucose: 136 mg/dL — ABNORMAL HIGH (ref 65–99)
Potassium: 4.5 mmol/L (ref 3.5–5.2)
SODIUM: 140 mmol/L (ref 134–144)

## 2018-03-14 LAB — VITAMIN B12: VITAMIN B 12: 219 pg/mL — AB (ref 232–1245)

## 2018-03-19 ENCOUNTER — Ambulatory Visit (INDEPENDENT_AMBULATORY_CARE_PROVIDER_SITE_OTHER): Payer: Medicare HMO | Admitting: Pharmacist

## 2018-03-19 DIAGNOSIS — I480 Paroxysmal atrial fibrillation: Secondary | ICD-10-CM

## 2018-03-19 DIAGNOSIS — Z5181 Encounter for therapeutic drug level monitoring: Secondary | ICD-10-CM | POA: Diagnosis not present

## 2018-03-19 LAB — POCT INR: INR: 1.9 — AB (ref 2.0–3.0)

## 2018-03-19 NOTE — Patient Instructions (Signed)
START NEW DOSAGE of 2 tablets daily except 1 tablets on Saturdays. Recheck in 3 weeks. Please return to eating your normal intake of leafy vegetables.  Please call with any changes in medications or new medications or if scheduled for any procedures [phone]

## 2018-04-08 ENCOUNTER — Other Ambulatory Visit: Payer: Medicare HMO | Admitting: *Deleted

## 2018-04-08 ENCOUNTER — Ambulatory Visit (INDEPENDENT_AMBULATORY_CARE_PROVIDER_SITE_OTHER): Payer: Medicare HMO | Admitting: *Deleted

## 2018-04-08 DIAGNOSIS — I480 Paroxysmal atrial fibrillation: Secondary | ICD-10-CM

## 2018-04-08 DIAGNOSIS — E782 Mixed hyperlipidemia: Secondary | ICD-10-CM

## 2018-04-08 DIAGNOSIS — Z5181 Encounter for therapeutic drug level monitoring: Secondary | ICD-10-CM | POA: Diagnosis not present

## 2018-04-08 LAB — HEPATIC FUNCTION PANEL
ALT: 13 IU/L (ref 0–44)
AST: 20 IU/L (ref 0–40)
Albumin: 4 g/dL (ref 3.5–4.8)
Alkaline Phosphatase: 119 IU/L — ABNORMAL HIGH (ref 39–117)
Bilirubin Total: 0.3 mg/dL (ref 0.0–1.2)
Bilirubin, Direct: 0.12 mg/dL (ref 0.00–0.40)
Total Protein: 6.8 g/dL (ref 6.0–8.5)

## 2018-04-08 LAB — LIPID PANEL
Chol/HDL Ratio: 4.4 ratio (ref 0.0–5.0)
Cholesterol, Total: 153 mg/dL (ref 100–199)
HDL: 35 mg/dL — AB (ref >39)
TRIGLYCERIDES: 542 mg/dL — AB (ref 0–149)

## 2018-04-08 LAB — POCT INR: INR: 4.3 — AB (ref 2.0–3.0)

## 2018-04-08 IMAGING — CR DG KNEE COMPLETE 4+V*L*
4 series · 4 of 4 positions shown · non-contrast
Comparison: None.

CLINICAL DATA: Fall, knee pain

EXAM:
LEFT KNEE - COMPLETE 4+ VIEW

[knee ap]
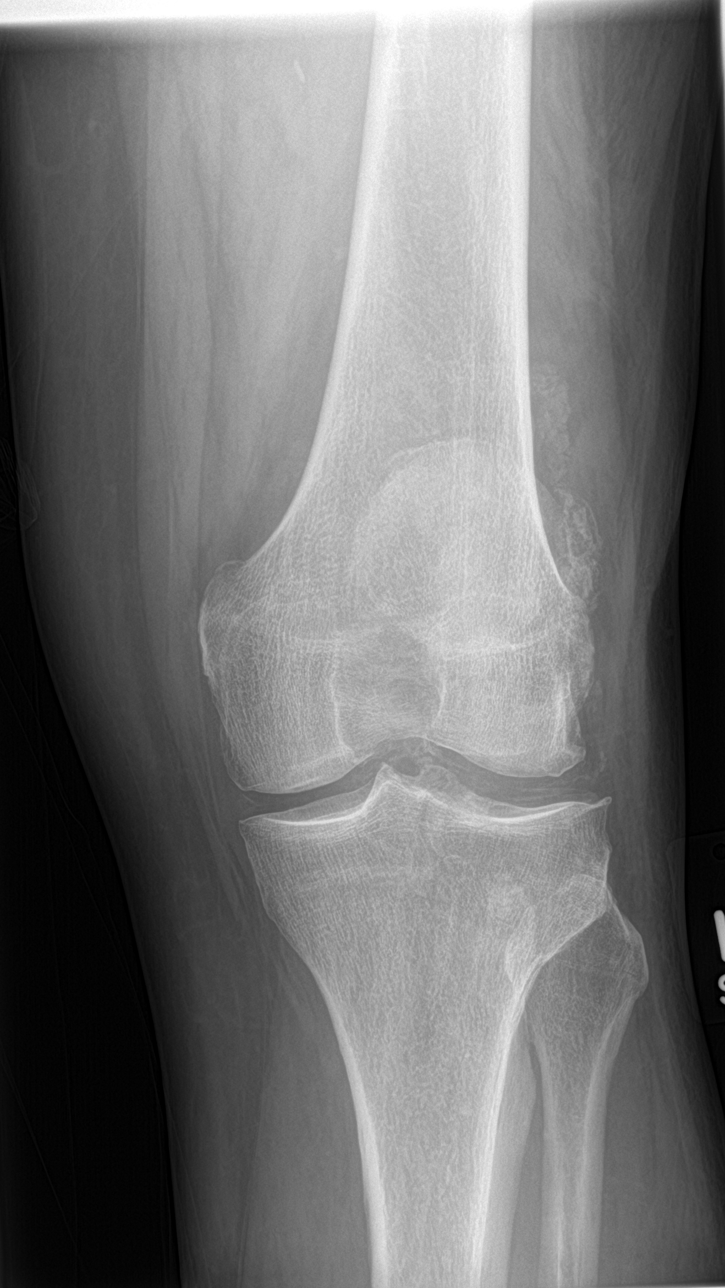

[knee lat]
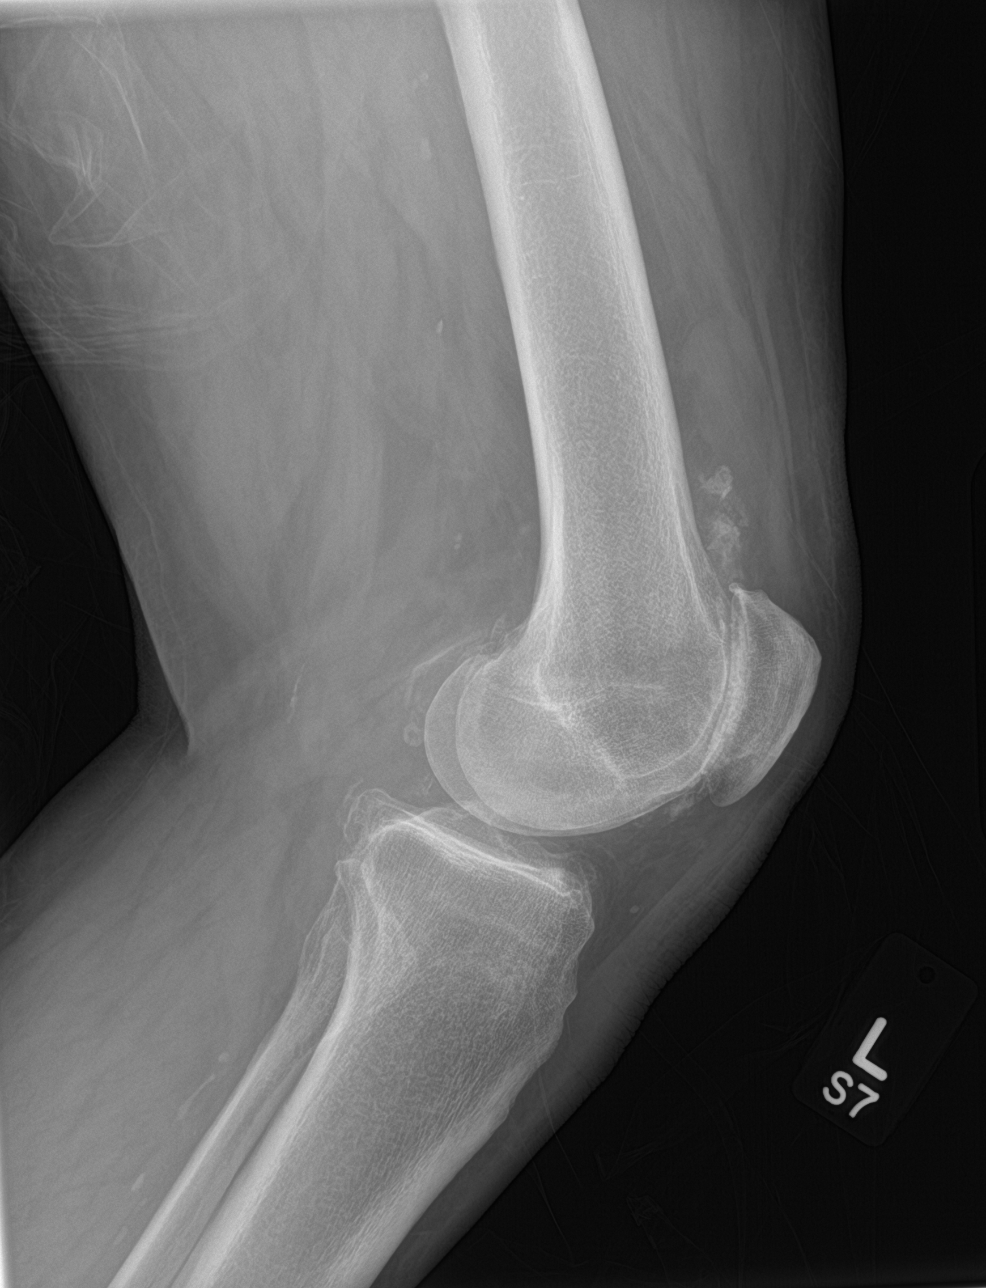

[knee obl (1 of 2)]
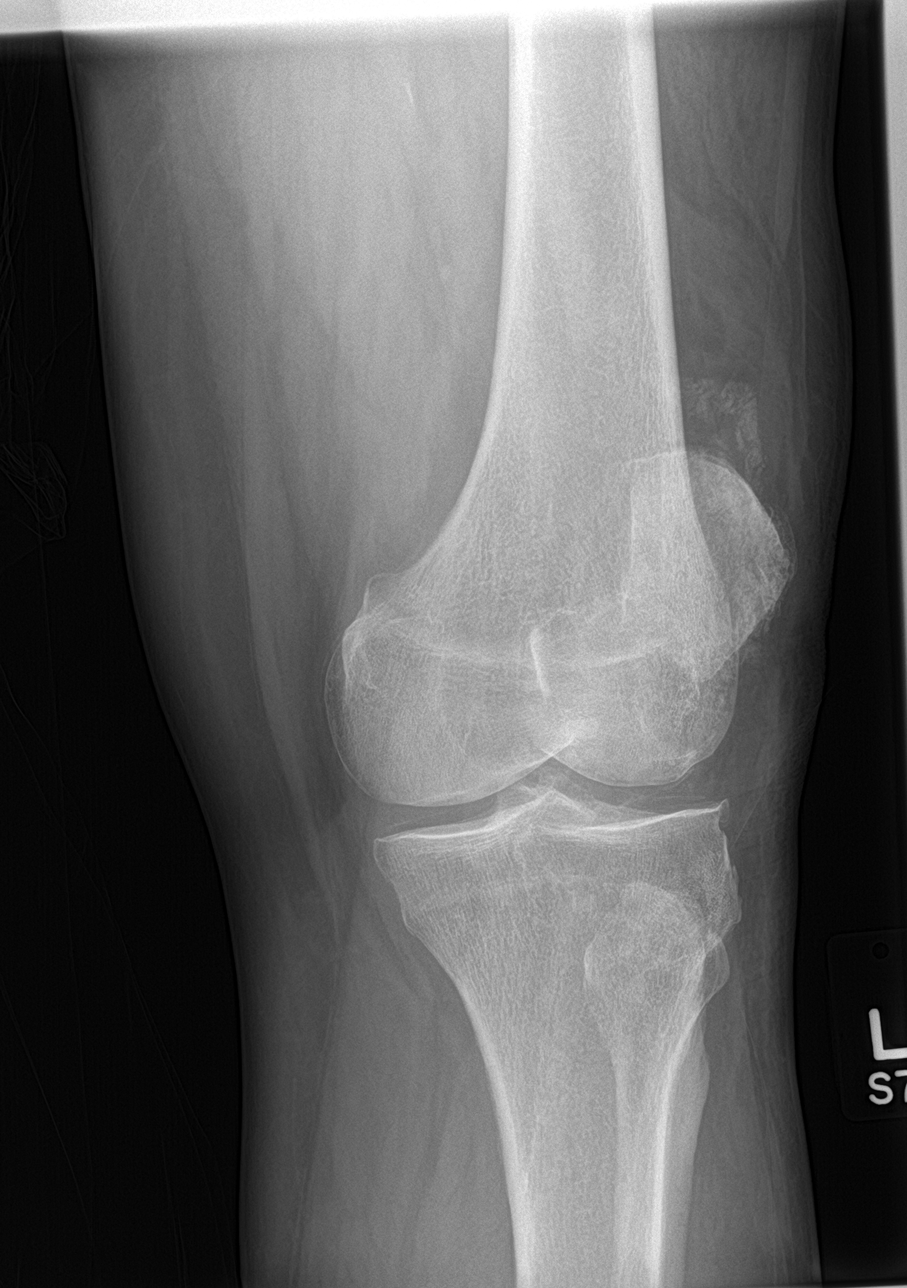

[knee obl (2 of 2)]
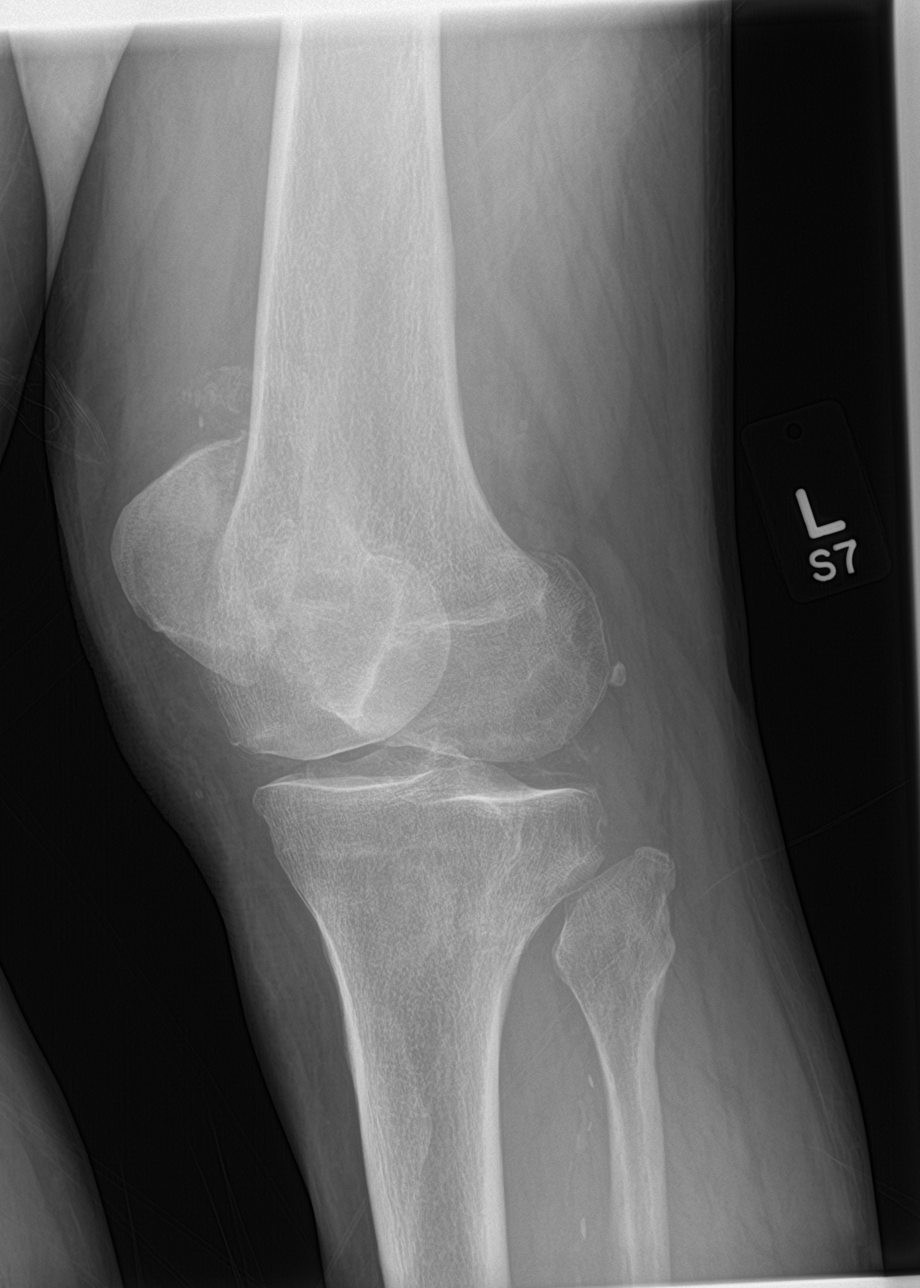

[4 of 4 positions shown; findings below may reference images not displayed]

FINDINGS: Mild chondrocalcinosis in the medial lateral joint. No significant
joint space narrowing. Mild lateral joint margin spurring.

Advanced degenerative change in the patellofemoral joint. Calcified
loose bodies in the suprapatellar bursa. Small associated joint
effusion

Negative for fracture
IMPRESSION: Severe degenerative change in the patellofemoral joint with
calcified loose bodies in the suprapatellar bursa. No acute
fracture.

## 2018-04-08 NOTE — Patient Instructions (Signed)
Description   Do not take any Coumadin today and tomorrow take 1 tablet then continue taking 2 tablets daily except 1 tablet on Saturdays. Recheck in 2 weeks. Please return to eating your normal intake of leafy vegetables.  Please call with any changes in medications or new medications or if scheduled for any procedures [phone]

## 2018-04-10 ENCOUNTER — Ambulatory Visit: Payer: Self-pay | Admitting: Surgery

## 2018-04-10 ENCOUNTER — Other Ambulatory Visit: Payer: Medicare HMO

## 2018-04-11 ENCOUNTER — Other Ambulatory Visit: Payer: Self-pay | Admitting: Internal Medicine

## 2018-04-11 ENCOUNTER — Telehealth: Payer: Self-pay

## 2018-04-11 DIAGNOSIS — I10 Essential (primary) hypertension: Secondary | ICD-10-CM

## 2018-04-11 DIAGNOSIS — E782 Mixed hyperlipidemia: Secondary | ICD-10-CM

## 2018-04-11 NOTE — Telephone Encounter (Signed)
Notes recorded by Leeroy Bock, RPH on 04/11/2018 at 9:35 AM EST Would confirm that pt has been adherent to lipid lowering therapy including atorvastatin 80mg  daily, Vascepa 2g twice daily, and fenofibrate 145mg  once daily. Would also make sure that he was fasting when labs were drawn and that he has been limiting his intake of sugar, carbs, and alcohol. If all of the above are true, could consider adding Niaspan 500mg  BID and rechecking fasting lipids and LFTs in 2-3 months. ------  Notes recorded by Jettie Booze, MD on 04/10/2018 at 5:33 PM EST TG increasing. WOuld get recs from lipid clinic in this patient with prior CABG and medical management of NSTEMI.

## 2018-04-11 NOTE — Telephone Encounter (Signed)
Left message for patient to call back  

## 2018-04-12 ENCOUNTER — Other Ambulatory Visit: Payer: Self-pay | Admitting: Internal Medicine

## 2018-04-12 NOTE — Telephone Encounter (Signed)
Next appt scheduled 06/11/18 with PCP.

## 2018-04-16 NOTE — Telephone Encounter (Signed)
Called and spoke to the patient. Patient states that he has been taking atorvastatin 80mg  daily, Vascepa 2g twice daily, and fenofibrate 145mg  once daily. Patient states that he has been eating a lot of sweets and drinking sodas. Patient wants to hold off on starting a new med at this time and wants to work on improving his diet. Lab appointment made on 4/15 to check fasting LIPIDS, LFTs, and direct LDL.

## 2018-04-18 ENCOUNTER — Ambulatory Visit
Admission: RE | Admit: 2018-04-18 | Discharge: 2018-04-18 | Disposition: A | Discharge status: Home / Self Care | Payer: Medicare HMO | Source: Ambulatory Visit | Attending: Surgery | Admitting: Surgery

## 2018-04-18 ENCOUNTER — Ambulatory Visit (INDEPENDENT_AMBULATORY_CARE_PROVIDER_SITE_OTHER): Payer: Medicare HMO | Admitting: Surgery

## 2018-04-18 ENCOUNTER — Other Ambulatory Visit: Payer: Self-pay

## 2018-04-18 ENCOUNTER — Encounter: Payer: Self-pay | Admitting: Surgery

## 2018-04-18 VITALS — BP 105/75 | HR 75 | Resp 18 | Ht 70.0 in | Wt 231.0 lb

## 2018-04-18 DIAGNOSIS — I712 Thoracic aortic aneurysm, without rupture, unspecified: Secondary | ICD-10-CM

## 2018-04-18 NOTE — Progress Notes (Signed)
HPI:  The patient is a 73 year old gentleman with a history of hypertension, hyperlipidemia, paroxysmal atrial fibrillation on Coumadin, prior stroke with known left internal carotid artery occlusion, stage III chronic kidney disease, abdominal aortic aneurysm status post EVAR in 2010, and coronary artery disease status post CABG in 2004.  The patient had a non-ST segment elevation MI in June 2018 and underwent cardiac catheterization showing severe native three-vessel coronary disease and severe vein graft disease in the graft to an obtuse marginal branch.  He has been treated medically and continued on Coumadin. He had a CT scan of the chest, abdomen, and pelvis when he presented in June 2018 and this showed a 4.6 cm fusiform ascending aneurysm.  He returns today with a CTA of the chest to follow-up on this aneurysm.  He says that he feels well overall.  He has had some cough for the past couple weeks but no fever or chills.  He has no sputum production or hemoptysis.  He denies any chest pain.  Denies shortness of breath.  He denies any headaches or visual changes.  Current Outpatient Medications  Medication Sig Dispense Refill  . atorvastatin (LIPITOR) 80 MG tablet Take 1 tablet (80 mg total) by mouth daily at 6 PM. 90 tablet 3  . clopidogrel (PLAVIX) 75 MG tablet TAKE 1 TABLET BY MOUTH ONCE DAILY 90 tablet 1  . Coenzyme Q10 (CO Q 10 PO) Take by mouth.    . diphenhydrAMINE (BENADRYL) 25 MG tablet Take 1 tablet (25 mg total) by mouth every 6 (six) hours. 20 tablet 0  . fenofibrate (TRICOR) 145 MG tablet Take 1 tablet (145 mg total) by mouth daily. 30 tablet 11  . gabapentin (NEURONTIN) 300 MG capsule Take 1 capsule (300 mg total) by mouth 2 (two) times daily. 60 capsule 1  . Icosapent Ethyl (VASCEPA) 1 g CAPS Take 2 capsules (2 g total) by mouth 2 (two) times daily. 360 capsule 1  . isosorbide mononitrate (IMDUR) 60 MG 24 hr tablet TAKE 1 TABLET BY MOUTH ONCE DAILY 90 tablet 0  . lisinopril  (PRINIVIL,ZESTRIL) 5 MG tablet Take 1 tablet (5 mg total) by mouth daily. 90 tablet 3  . loratadine (CLARITIN) 10 MG tablet Take 1 tablet (10 mg total) by mouth daily. 30 tablet 2  . metoprolol succinate (TOPROL-XL) 25 MG 24 hr tablet TAKE 1 TABLET BY MOUTH ONCE DAILY 90 tablet 3  . Multiple Vitamin (MULTIVITAMIN) tablet Take 1 tablet by mouth daily. 90 tablet 3  . vitamin B-12 (CYANOCOBALAMIN) 1000 MCG tablet Take 1 tablet (1,000 mcg total) by mouth daily. 90 tablet 0  . warfarin (COUMADIN) 3 MG tablet TAKE AS DIRECTED BY COUMADIN CLINIC 50 tablet 3  . torsemide (DEMADEX) 20 MG tablet Take 1 tablet (20 mg total) by mouth daily. 30 tablet 11   No current facility-administered medications for this visit.      Physical Exam: BP 105/75 (BP Location: Right Arm, Patient Position: Sitting, Cuff Size: Large)   Pulse 75   Resp 18   Ht 5\' 10"  (1.778 m)   Wt 231 lb (104.8 kg)   SpO2 96% Comment: RA  BMI 33.15 kg/m  He looks well. There is no cervical or supraclavicular adenopathy. Cardiac exam shows a regular rate and rhythm with normal heart sounds.  There is no murmur. Lungs are clear. Abdominal exam shows active bowel sounds.  His abdomen is soft mildly obese and nontender.  There are no palpable masses. There is  no peripheral edema.  Diagnostic Tests:  CLINICAL DATA:  TAA f/u no chest pain no SOB no surgeries no cancer.  EXAM: CT CHEST WITHOUT CONTRAST  TECHNIQUE: Multidetector CT imaging of the chest was performed following the standard protocol without IV contrast.  COMPARISON:  09/06/2016 and previous  FINDINGS: Cardiovascular: Heart size normal. No pericardial effusion. Extensive coronary calcifications. Previous CABG. Thoracic aortic transverse diameters as follows:  4.5 cm sinuses of Valsalva  4 cm sino-tubular junction  4.6 mid ascending (stable)  4.1 cm distal ascending/proximal arch  3 cm distal arch  3.3 cm proximal descending  3.5 cm mid  descending  2.9 cm distal descending  Scattered calcified atheromatous plaque through the arch and descending thoracic segment as well as in the visualized proximal abdominal aorta.  Mediastinum/Nodes: No hilar or mediastinal adenopathy localized, sensitivity decreased without IV contrast.  Lungs/Pleura: No pleural effusion. No pneumothorax. 2.4 cm lobular mass/consolidation medially at the right apex, new since previous. Lungs otherwise clear.  Upper Abdomen: No acute findings.  Musculoskeletal: Left shoulder DJD. Spondylitic changes in the visualized lower cervical and lower thoracic spine. Prior median sternotomy.  IMPRESSION: 1. Stable 4.6 cm ascending thoracic aortic aneurysm. Recommend semi-annual imaging followup by CTA or MRA and referral to cardiothoracic surgery if not already obtained. This recommendation follows 2010 ACCF/AHA/AATS/ACR/ASA/SCA/SCAI/SIR/STS/SVM Guidelines for the Diagnosis and Management of Patients With Thoracic Aortic Disease. Circulation. 2010; 121: E268-T419 2. New 2.4 cm mass/consolidation medially at the right apex, neoplasm versus pneumonia. Follow-up recommended. These results will be called to the ordering clinician or representative by the Radiologist Assistant, and communication documented in the PACS or zVision Dashboard.   Electronically Signed   By: Lucrezia Europe M.D.   On: 04/18/2018 10:43   Impression:  This 73 year old gentleman has a stable 4.6 cm fusiform ascending aortic aneurysm.  This is below the surgical threshold of 5.5 cm but will require continued follow-up.  His CT scan today also shows a new 2.4 cm lobular mass medially at the right apex which was not seen on his prior CT scan from June 2018.  There is some spiculation.  I think this is more suspicious for a neoplasm then consolidation.  I discussed the options of trying to obtain a tissue biopsy now versus continued follow-up with CT scanning in a few months and  biopsy only if it enlarges.  Given the appearance I recommended proceeding ahead with biopsy if possible.  He is in agreement.  I will arrange for evaluation by Dr. Roxan Hockey for consideration of navigational bronchoscopy for biopsy.  The location of this lesion at the right apex medially will make navigation bronchoscopy more difficult.  He has been on chronic Coumadin for atrial fibrillation and that would have to be held prior to any biopsy.  I reviewed the CT images with the patient and answered all of his questions.  Plan:  We will schedule an evaluation by Dr. Roxan Hockey for consideration of navigational bronchoscopy for biopsy.  If Dr. Roxan Hockey decides to proceed with that that I will see him back afterwards to review the results and decide on further treatment.  He has an extensive cardiac history and marginal PFTs for a lobectomy so I think at his age he would likely be treated nonsurgically if this was a lung cancer.  I spent 15 minutes performing this established patient evaluation and > 50% of this time was spent face to face counseling and coordinating the care of this patient's ascending aortic aneurysm and new right  upper lobe apical lung mass.    Gaye Pollack, MD [facility] [phone]

## 2018-04-19 ENCOUNTER — Emergency Department (HOSPITAL_COMMUNITY): Payer: Medicare HMO

## 2018-04-19 ENCOUNTER — Emergency Department (HOSPITAL_COMMUNITY)
Admission: EM | Admit: 2018-04-19 | Discharge: 2018-04-20 | Disposition: A | Discharge status: Home / Self Care | Payer: Medicare HMO | Attending: Emergency Medicine | Admitting: Emergency Medicine

## 2018-04-19 ENCOUNTER — Encounter (HOSPITAL_COMMUNITY): Payer: Self-pay | Admitting: Emergency Medicine

## 2018-04-19 ENCOUNTER — Other Ambulatory Visit: Payer: Self-pay

## 2018-04-19 DIAGNOSIS — Z79899 Other long term (current) drug therapy: Secondary | ICD-10-CM | POA: Diagnosis not present

## 2018-04-19 DIAGNOSIS — R079 Chest pain, unspecified: Secondary | ICD-10-CM | POA: Diagnosis not present

## 2018-04-19 DIAGNOSIS — J209 Acute bronchitis, unspecified: Secondary | ICD-10-CM | POA: Diagnosis not present

## 2018-04-19 DIAGNOSIS — J40 Bronchitis, not specified as acute or chronic: Secondary | ICD-10-CM

## 2018-04-19 DIAGNOSIS — N183 Chronic kidney disease, stage 3 (moderate): Secondary | ICD-10-CM | POA: Insufficient documentation

## 2018-04-19 DIAGNOSIS — Z87891 Personal history of nicotine dependence: Secondary | ICD-10-CM | POA: Diagnosis not present

## 2018-04-19 DIAGNOSIS — I129 Hypertensive chronic kidney disease with stage 1 through stage 4 chronic kidney disease, or unspecified chronic kidney disease: Secondary | ICD-10-CM | POA: Insufficient documentation

## 2018-04-19 DIAGNOSIS — I444 Left anterior fascicular block: Secondary | ICD-10-CM | POA: Diagnosis not present

## 2018-04-19 DIAGNOSIS — I4510 Unspecified right bundle-branch block: Secondary | ICD-10-CM | POA: Diagnosis not present

## 2018-04-19 LAB — BASIC METABOLIC PANEL
Anion gap: 13 (ref 5–15)
BUN: 20 mg/dL (ref 8–23)
CO2: 20 mmol/L — AB (ref 22–32)
Calcium: 8.6 mg/dL — ABNORMAL LOW (ref 8.9–10.3)
Chloride: 106 mmol/L (ref 98–111)
Creatinine, Ser: 1.4 mg/dL — ABNORMAL HIGH (ref 0.61–1.24)
GFR calc Af Amer: 57 mL/min — ABNORMAL LOW (ref >60)
GFR calc non Af Amer: 49 mL/min — ABNORMAL LOW (ref >60)
GLUCOSE: 99 mg/dL (ref 70–99)
Potassium: 4.1 mmol/L (ref 3.5–5.1)
Sodium: 139 mmol/L (ref 135–145)

## 2018-04-19 LAB — CBC
HCT: 41.7 % (ref 39.0–52.0)
Hemoglobin: 13.3 g/dL (ref 13.0–17.0)
MCH: 29.6 pg (ref 26.0–34.0)
MCHC: 31.9 g/dL (ref 30.0–36.0)
MCV: 92.9 fL (ref 80.0–100.0)
Platelets: 151 10*3/uL (ref 150–400)
RBC: 4.49 MIL/uL (ref 4.22–5.81)
RDW: 14 % (ref 11.5–15.5)
WBC: 3.5 10*3/uL — ABNORMAL LOW (ref 4.0–10.5)
nRBC: 0 % (ref 0.0–0.2)

## 2018-04-19 LAB — I-STAT TROPONIN, ED
Troponin i, poc: 0.01 ng/mL (ref 0.00–0.08)
Troponin i, poc: 0 ng/mL (ref 0.00–0.08)

## 2018-04-19 MED ORDER — IPRATROPIUM-ALBUTEROL 0.5-2.5 (3) MG/3ML IN SOLN
3.0000 mL | Freq: Once | RESPIRATORY_TRACT | Status: AC
  Administered 2018-04-19: 3 mL via RESPIRATORY_TRACT
  Filled 2018-04-19: qty 3

## 2018-04-19 MED ORDER — PREDNISONE 50 MG PO TABS: 50.0000 mg | ORAL_TABLET | Freq: Every day | ORAL | 0 refills | Status: DC

## 2018-04-19 MED ORDER — ACETAMINOPHEN 325 MG PO TABS
650.0000 mg | ORAL_TABLET | Freq: Once | ORAL | Status: AC
  Administered 2018-04-19: 650 mg via ORAL
  Filled 2018-04-19: qty 2

## 2018-04-19 MED ORDER — PREDNISONE 20 MG PO TABS
60.0000 mg | ORAL_TABLET | Freq: Once | ORAL | Status: AC
  Administered 2018-04-19: 60 mg via ORAL
  Filled 2018-04-19: qty 3

## 2018-04-19 MED ORDER — BENZONATATE 100 MG PO CAPS: 100.0000 mg | ORAL_CAPSULE | Freq: Three times a day (TID) | ORAL | 0 refills | Status: DC | PRN

## 2018-04-19 MED ORDER — ALBUTEROL SULFATE HFA 108 (90 BASE) MCG/ACT IN AERS
1.0000 | INHALATION_SPRAY | Freq: Four times a day (QID) | RESPIRATORY_TRACT | Status: DC | PRN
  Filled 2018-04-19: qty 6.7

## 2018-04-19 MED ORDER — SODIUM CHLORIDE 0.9% FLUSH: 3.0000 mL | Freq: Once | INTRAVENOUS | Status: DC

## 2018-04-19 NOTE — ED Provider Notes (Signed)
[facility] EMERGENCY DEPARTMENT Provider Note   CSN: [id number] Arrival date & time: 04/19/18  1913     History   Chief Complaint Chief Complaint  Patient presents with  . Chest Pain  . Generalized Body Aches    HPI Dylan Morgan is a 73 y.o. male.  HPI Pt started with runny nose and cough a few days ago.  He has had white sputum with coughing.  It hurts in his chest when he coughs, more on the left side.  No fevers.  No vomiting or diarrhea.    No leg swelling.  Pt does have history of MI.  Pt quit smoking in 2011.     Past Medical History:  Diagnosis Date  . AAA (abdominal aortic aneurysm) ([place])    a. 2010 s/p stenting  . Anemia   . Arthritis    "pretty much all over" (09/06/2016)  . Blind left eye    "explosion knocked hole in retina" (09/06/2016)  . Bradycardia   . CAD (coronary artery disease) CABG in 2004   a. 2004 s/p CABG. b. NSTEMI 09/2016 -> cath with occ VG-distal Cx, managed medically.  . Carotid artery disease ([place])    a. 2012 dopplers with old LICA occlusion , RICA no significant abnormality  . Chronic lower back pain   . CKD (chronic kidney disease), stage III ([place])   . CVA (cerebral vascular accident) [facility])    a. 2013 R Carona radiata stroke   . GERD (gastroesophageal reflux disease)   . History of blood transfusion 2004   "related to OHS"  . Hyperlipidemia   . Hypertension   . Paroxysmal atrial fibrillation ([place]) 02/22/2009   a. on Coumadin   . Pre-diabetes   . PVD (peripheral vascular disease) ([place])    a. ?R subclavian stenosis by CT 09/2016.  Marland Kitchen Seborrheic dermatitis of scalp   . Thrombocytopenia ([place])   . Tobacco abuse     Patient Active Problem List   Diagnosis Date Noted  . Seasonal allergies 11/20/2017  . Thoracic aortic aneurysm ([place]) 09/10/2016  . Vitamin B 12 deficiency 05/02/2016  . Carpal tunnel syndrome, bilateral 01/11/2016  . S/P AAA (abdominal aortic aneurysm) repair 06/21/2014  . Peripheral neuropathy  01/16/2014  . Chronic anticoagulation 05/01/2012  . TIA (transient ischemic attack) 11/23/2011  . Preventative health care 09/20/2011  . CVA (cerebral infarction)   . Prediabetes   . Long term (current) use of anticoagulants 08/04/2011  . Carotid artery disease ([place])   . CAD (coronary artery disease)   . Hypertension   . Hyperlipidemia   . Hx of CABG   . AAA (abdominal aortic aneurysm) ([place])   . Erectile dysfunction 02/06/2011  . Paroxysmal atrial fibrillation ([place]) 02/22/2009  . Sleep apnea 02/04/2007  . CKD (chronic kidney disease), stage III ([place]) 02/20/2006    Past Surgical History:  Procedure Laterality Date  . ABDOMINAL AORTIC ANEURYSM REPAIR  2010   Aortic stent repair  . CATARACT EXTRACTION W/ INTRAOCULAR LENS  IMPLANT, BILATERAL    . CORONARY ARTERY BYPASS GRAFT  2004   "CABG X3"  . LEFT HEART CATH AND CORS/GRAFTS ANGIOGRAPHY N/A 09/08/2016   Procedure: Left Heart Cath and Cors/Grafts Angiography;  Surgeon: Troy Sine, MD;  Location: [place] CV LAB;  Service: Cardiovascular;  Laterality: N/A;  . SHOULDER OPEN ROTATOR CUFF REPAIR Left 1990        Home Medications    Prior to Admission medications   Medication Sig Start  Date End Date Taking? Authorizing Provider  atorvastatin (LIPITOR) 80 MG tablet Take 1 tablet (80 mg total) by mouth daily at 6 PM. 01/28/18  Yes Jettie Booze, MD  clopidogrel (PLAVIX) 75 MG tablet TAKE 1 TABLET BY MOUTH ONCE DAILY 01/08/18  Yes Aldine Contes, MD  Coenzyme Q10 (CO Q 10 PO) Take by mouth.   Yes [provider]  diphenhydrAMINE (BENADRYL) 25 MG tablet Take 1 tablet (25 mg total) by mouth every 6 (six) hours. 02/28/18  Yes Varney Biles, MD  fenofibrate (TRICOR) 145 MG tablet Take 1 tablet (145 mg total) by mouth daily. 01/18/17  Yes Jettie Booze, MD  gabapentin (NEURONTIN) 300 MG capsule Take 1 capsule (300 mg total) by mouth 2 (two) times daily. 03/12/18  Yes Aldine Contes, MD  Icosapent Ethyl  (VASCEPA) 1 g CAPS Take 2 capsules (2 g total) by mouth 2 (two) times daily. 01/15/18  Yes Jettie Booze, MD  isosorbide mononitrate (IMDUR) 60 MG 24 hr tablet TAKE 1 TABLET BY MOUTH ONCE DAILY 04/12/18  Yes Aldine Contes, MD  lisinopril (PRINIVIL,ZESTRIL) 5 MG tablet Take 1 tablet (5 mg total) by mouth daily. 01/28/18  Yes Jettie Booze, MD  metoprolol succinate (TOPROL-XL) 25 MG 24 hr tablet TAKE 1 TABLET BY MOUTH ONCE DAILY 03/06/18  Yes Aldine Contes, MD  Multiple Vitamin (MULTIVITAMIN) tablet Take 1 tablet by mouth daily. 02/29/16  Yes Riccardo Dubin, MD  torsemide (DEMADEX) 20 MG tablet Take 1 tablet (20 mg total) by mouth daily. 01/23/17 04/19/18 Yes Jettie Booze, MD  vitamin B-12 (CYANOCOBALAMIN) 1000 MCG tablet Take 1 tablet (1,000 mcg total) by mouth daily. 11/21/17  Yes Aldine Contes, MD  warfarin (COUMADIN) 3 MG tablet TAKE AS DIRECTED BY COUMADIN CLINIC 01/04/18  Yes Camnitz, Ocie Doyne, MD  benzonatate (TESSALON) 100 MG capsule Take 1 capsule (100 mg total) by mouth 3 (three) times daily as needed for cough. 04/19/18   Dorie Rank, MD  loratadine (CLARITIN) 10 MG tablet Take 1 tablet (10 mg total) by mouth daily. 11/20/17 11/20/18  Aldine Contes, MD  predniSONE (DELTASONE) 50 MG tablet Take 1 tablet (50 mg total) by mouth daily. 04/19/18   Dorie Rank, MD    Family History Family History  Problem Relation Age of Onset  . Diabetes Mother   . Stroke Father   . Thyroid cancer Sister   . Heart disease Brother        Coronary artery disease  . Heart attack Brother   . Heart attack Brother     Social History Social History   Tobacco Use  . Smoking status: Former Smoker    Packs/day: 3.00    Years: 51.00    Pack years: 153.00    Types: Cigarettes    Last attempt to quit: 02/03/2010    Years since quitting: 8.2  . Smokeless tobacco: Never Used  . Tobacco comment: "chewed tobacco 1-2 times'  Substance Use Topics  . Alcohol use: Yes     Alcohol/week: 0.0 standard drinks    Comment: 09/06/2016 "nothing in a long while; used to drink q now and then"  . Drug use: Yes    Types: Marijuana    Comment: 09/06/2016 "stopped in 2002"     Allergies   Diltiazem hcl   Review of Systems Review of Systems  All other systems reviewed and are negative.    Physical Exam Updated Vital Signs BP 139/89   Pulse (!) 57   Temp 98.3 F (  36.8 C) (Oral)   Resp 12   Ht 1.803 m (5\' 11" )   Wt 108 kg   SpO2 93%   BMI 33.19 kg/m   Physical Exam Vitals signs and nursing note reviewed.  Constitutional:      General: He is not in acute distress.    Appearance: He is well-developed.  HENT:     Head: Normocephalic and atraumatic.     Right Ear: External ear normal.     Left Ear: External ear normal.  Eyes:     General: No scleral icterus.       Right eye: No discharge.        Left eye: No discharge.     Conjunctiva/sclera: Conjunctivae normal.  Neck:     Musculoskeletal: Neck supple.     Trachea: No tracheal deviation.  Cardiovascular:     Rate and Rhythm: Normal rate and regular rhythm.  Pulmonary:     Effort: Pulmonary effort is normal. No respiratory distress.     Breath sounds: No stridor. Wheezing present. No rales.     Comments: Frequent cough Abdominal:     General: Bowel sounds are normal. There is no distension.     Palpations: Abdomen is soft.     Tenderness: There is no abdominal tenderness. There is no guarding or rebound.  Musculoskeletal:        General: No tenderness.  Skin:    General: Skin is warm and dry.     Findings: No rash.  Neurological:     Mental Status: He is alert.     Cranial Nerves: No cranial nerve deficit (no facial droop, extraocular movements intact, no slurred speech).     Sensory: No sensory deficit.     Motor: No abnormal muscle tone or seizure activity.     Coordination: Coordination normal.      ED Treatments / Results  Labs (all labs ordered are listed, but only abnormal  results are displayed) Labs Reviewed  BASIC METABOLIC PANEL - Abnormal; Notable for the following components:      Result Value   CO2 20 (*)    Creatinine, Ser 1.40 (*)    Calcium 8.6 (*)    GFR calc non Af Amer 49 (*)    GFR calc Af Amer 57 (*)    All other components within normal limits  CBC - Abnormal; Notable for the following components:   WBC 3.5 (*)    All other components within normal limits  I-STAT TROPONIN, ED  I-STAT TROPONIN, ED    EKG EKG Interpretation  Date/Time:  Friday April 19 2018 20:25:44 EST Ventricular Rate:  58 PR Interval:    QRS Duration: 111 QT Interval:  440 QTC Calculation: 433 R Axis:   -47 Text Interpretation:  Sinus rhythm Atrial premature complex Incomplete RBBB and LAFB Low voltage, precordial leads Abnormal R-wave progression, early transition No significant change since last tracing Confirmed by Dorie Rank [phone]) on 04/19/2018 8:46:49 PM   Radiology Dg Chest 2 View  Result Date: 04/19/2018 CLINICAL DATA:  Chest pain for 3 days. EXAM: CHEST - 2 VIEW COMPARISON:  June 07, 2017 FINDINGS: Stable sternotomy wires and cardiomegaly. Minimal atelectasis in the left base. The heart, hila, mediastinum, lungs, and pleura are otherwise unchanged with no acute abnormalities. IMPRESSION: No active cardiopulmonary disease. Electronically Signed   By: Dorise Bullion III M.D   On: 04/19/2018 20:13   Ct Chest Wo Contrast  Result Date: 04/18/2018 CLINICAL DATA:  TAA f/u no  chest pain no SOB no surgeries no cancer. EXAM: CT CHEST WITHOUT CONTRAST TECHNIQUE: Multidetector CT imaging of the chest was performed following the standard protocol without IV contrast. COMPARISON:  09/06/2016 and previous FINDINGS: Cardiovascular: Heart size normal. No pericardial effusion. Extensive coronary calcifications. Previous CABG. Thoracic aortic transverse diameters as follows: 4.5 cm sinuses of Valsalva 4 cm sino-tubular junction 4.6 mid ascending (stable) 4.1 cm distal  ascending/proximal arch 3 cm distal arch 3.3 cm proximal descending 3.5 cm mid descending 2.9 cm distal descending Scattered calcified atheromatous plaque through the arch and descending thoracic segment as well as in the visualized proximal abdominal aorta. Mediastinum/Nodes: No hilar or mediastinal adenopathy localized, sensitivity decreased without IV contrast. Lungs/Pleura: No pleural effusion. No pneumothorax. 2.4 cm lobular mass/consolidation medially at the right apex, new since previous. Lungs otherwise clear. Upper Abdomen: No acute findings. Musculoskeletal: Left shoulder DJD. Spondylitic changes in the visualized lower cervical and lower thoracic spine. Prior median sternotomy. IMPRESSION: 1. Stable 4.6 cm ascending thoracic aortic aneurysm. Recommend semi-annual imaging followup by CTA or MRA and referral to cardiothoracic surgery if not already obtained. This recommendation follows 2010 ACCF/AHA/AATS/ACR/ASA/SCA/SCAI/SIR/STS/SVM Guidelines for the Diagnosis and Management of Patients With Thoracic Aortic Disease. Circulation. 2010; 121: T364-W803 2. New 2.4 cm mass/consolidation medially at the right apex, neoplasm versus pneumonia. Follow-up recommended. These results will be called to the ordering clinician or representative by the Radiologist Assistant, and communication documented in the PACS or zVision Dashboard. Electronically Signed   By: Lucrezia Europe M.D.   On: 04/18/2018 10:43    Procedures Procedures (including critical care time)  Medications Ordered in ED Medications  sodium chloride flush (NS) 0.9 % injection 3 mL (has no administration in time range)  albuterol (PROVENTIL HFA;VENTOLIN HFA) 108 (90 Base) MCG/ACT inhaler 1-2 puff (has no administration in time range)  ipratropium-albuterol (DUONEB) 0.5-2.5 (3) MG/3ML nebulizer solution 3 mL (3 mLs Nebulization Given 04/19/18 2114)  predniSONE (DELTASONE) tablet 60 mg (60 mg Oral Given 04/19/18 2114)  acetaminophen (TYLENOL) tablet  650 mg (650 mg Oral Given 04/19/18 2114)     Initial Impression / Assessment and Plan / ED Course  I have reviewed the triage vital signs and the nursing notes.  Pertinent labs & imaging results that were available during my care of the patient were reviewed by me and considered in my medical decision making (see chart for details).  Clinical Course as of Apr 19 2350  Fri Apr 19, 2018  2350 Patient is feeling much better after treatment.  Symptoms have resolved.   [JK]    Clinical Course User Index [JK] Dorie Rank, MD  Presented to the emergency room for evaluation of chest pain and cough congestion.  On exam he was wheezing.  Patient was treated with albuterol and steroids.  His symptoms improved significantly.  Laboratory tests and serial cardiac enzymes are normal.  I doubt acute coronary syndrome, pulmonary emphysema or pneumonia.  Plan on discharge home with treatment for bronchitis with bronchospasm.  Final Clinical Impressions(s) / ED Diagnoses   Final diagnoses:  Bronchitis    ED Discharge Orders         Ordered    predniSONE (DELTASONE) 50 MG tablet  Daily     04/19/18 2350    benzonatate (TESSALON) 100 MG capsule  3 times daily PRN     04/19/18 2350           Dorie Rank, MD 04/19/18 2352

## 2018-04-19 NOTE — ED Triage Notes (Signed)
Pt presents to ED from home. Pt complains of chest pain for 3 days. Pt states it is left sided. Pt also has productive cough

## 2018-04-19 NOTE — Discharge Instructions (Addendum)
Take the medications as prescribed, follow-up with your primary care doctor next week to make sure you are improving

## 2018-04-26 ENCOUNTER — Ambulatory Visit (INDEPENDENT_AMBULATORY_CARE_PROVIDER_SITE_OTHER): Payer: Medicare HMO | Admitting: *Deleted

## 2018-04-26 DIAGNOSIS — I480 Paroxysmal atrial fibrillation: Secondary | ICD-10-CM

## 2018-04-26 DIAGNOSIS — Z5181 Encounter for therapeutic drug level monitoring: Secondary | ICD-10-CM

## 2018-04-26 LAB — POCT INR: INR: 2.2 (ref 2.0–3.0)

## 2018-04-26 NOTE — Patient Instructions (Signed)
Description   Continue taking 2 tablets daily except 1 tablet on Saturdays. Recheck in 3 weeks. Please return to eating your normal intake of leafy vegetables.  Please call with any changes in medications or new medications such as Prednisone or Antibiotics or if scheduled for any procedures [phone]

## 2018-04-29 ENCOUNTER — Encounter: Payer: Medicare HMO | Admitting: Thoracic Surgery (Cardiothoracic Vascular Surgery)

## 2018-04-30 ENCOUNTER — Encounter: Payer: Self-pay | Admitting: Thoracic Surgery (Cardiothoracic Vascular Surgery)

## 2018-05-17 ENCOUNTER — Ambulatory Visit (INDEPENDENT_AMBULATORY_CARE_PROVIDER_SITE_OTHER): Payer: Medicare HMO | Admitting: Pharmacist

## 2018-05-17 DIAGNOSIS — Z5181 Encounter for therapeutic drug level monitoring: Secondary | ICD-10-CM

## 2018-05-17 DIAGNOSIS — I480 Paroxysmal atrial fibrillation: Secondary | ICD-10-CM | POA: Diagnosis not present

## 2018-05-17 LAB — POCT INR: INR: 4.1 — AB (ref 2.0–3.0)

## 2018-05-17 NOTE — Patient Instructions (Addendum)
Description   No Warfarin today then continue taking 2 tablets daily except 1 tablet on Saturdays. Recheck in 3 weeks.  Please call with any changes in medications or new medications such as Prednisone or Antibiotics or if scheduled for any procedures [phone]

## 2018-05-23 ENCOUNTER — Other Ambulatory Visit: Payer: Self-pay | Admitting: *Deleted

## 2018-05-23 ENCOUNTER — Other Ambulatory Visit: Payer: Self-pay

## 2018-05-23 ENCOUNTER — Institutional Professional Consult (permissible substitution) (INDEPENDENT_AMBULATORY_CARE_PROVIDER_SITE_OTHER): Payer: Medicare HMO | Admitting: Thoracic Surgery (Cardiothoracic Vascular Surgery)

## 2018-05-23 VITALS — BP 124/66 | HR 72 | Resp 20 | Ht 71.0 in | Wt 234.0 lb

## 2018-05-23 DIAGNOSIS — R911 Solitary pulmonary nodule: Secondary | ICD-10-CM

## 2018-05-23 DIAGNOSIS — R918 Other nonspecific abnormal finding of lung field: Secondary | ICD-10-CM | POA: Diagnosis not present

## 2018-05-23 NOTE — Progress Notes (Signed)
[address]       [place] [address]             [phone]       HPI: Mr. Debruhl is sent for consultation for possible navigational bronchoscopy.  Jeremyah Jelley is a 73 year old patient of Dr. Vivi Martens.  He has a past medical history of hypertension, hyperlipidemia, coronary artery disease, coronary bypass grafting in 2004, non-ST elevation MI in 2018, paroxysmal atrial fibrillation, stroke, left internal carotid artery occlusion, stage III chronic kidney disease, and EVAR.  He was found to have a 4.6 cm ascending aneurysm in June 2018.  He recently saw Dr. Cyndia Bent in follow-up.  His CT showed the ascending aneurysm was stable, but there was a new right apical lung nodule.  He has nearly 100-pack-year history of smoking prior to quitting in 2011.  He has not had any change in appetite or weight loss.  He is not had any unusual headaches or visual changes.  He is blind in his left eye.  He denies any chest pain, pressure, or tightness.  He does get short of breath with exertion, but there have been no recent changes.  Zubrod Score: At the time of surgery this patient's most appropriate activity status/level should be described as: []     0    Normal activity, no symptoms []     1    Restricted in physical strenuous activity but ambulatory, able to do out light work [x]     2    Ambulatory and capable of self care, unable to do work activities, up and about >50 % of waking hours                              []     3    Only limited self care, in bed greater than 50% of waking hours []     4    Completely disabled, no self care, confined to bed or chair []     5    Moribund  Past Medical History:  Diagnosis Date  . AAA (abdominal aortic aneurysm) ([place])    a. 2010 s/p stenting  . Anemia   . Arthritis    "pretty much all over" (09/06/2016)  . Blind left eye    "explosion knocked hole in retina" (09/06/2016)  . Bradycardia   . CAD (coronary artery disease) CABG in 2004   a.  2004 s/p CABG. b. NSTEMI 09/2016 -> cath with occ VG-distal Cx, managed medically.  . Carotid artery disease ([place])    a. 2012 dopplers with old LICA occlusion , RICA no significant abnormality  . Chronic lower back pain   . CKD (chronic kidney disease), stage III ([place])   . CVA (cerebral vascular accident) [facility])    a. 2013 R Carona radiata stroke   . GERD (gastroesophageal reflux disease)   . History of blood transfusion 2004   "related to OHS"  . Hyperlipidemia   . Hypertension   . Paroxysmal atrial fibrillation ([place]) 02/22/2009   a. on Coumadin   . Pre-diabetes   . PVD (peripheral vascular disease) ([place])    a. ?R subclavian stenosis by CT 09/2016.  Marland Kitchen Seborrheic dermatitis of scalp   . Thrombocytopenia ([place])   . Tobacco abuse    Past Surgical History:  Procedure Laterality Date  . ABDOMINAL AORTIC ANEURYSM REPAIR  2010   Aortic stent repair  . CATARACT EXTRACTION W/  INTRAOCULAR LENS  IMPLANT, BILATERAL    . CORONARY ARTERY BYPASS GRAFT  2004   "CABG X3"  . LEFT HEART CATH AND CORS/GRAFTS ANGIOGRAPHY N/A 09/08/2016   Procedure: Left Heart Cath and Cors/Grafts Angiography;  Surgeon: Troy Sine, MD;  Location: [facility] CV LAB;  Service: Cardiovascular;  Laterality: N/A;  . SHOULDER OPEN ROTATOR CUFF REPAIR Left 1990     Current Outpatient Medications  Medication Sig Dispense Refill  . atorvastatin (LIPITOR) 80 MG tablet Take 1 tablet (80 mg total) by mouth daily at 6 PM. 90 tablet 3  . Coenzyme Q10 (CO Q 10 PO) Take by mouth.    . fenofibrate (TRICOR) 145 MG tablet Take 1 tablet (145 mg total) by mouth daily. 30 tablet 11  . lisinopril (PRINIVIL,ZESTRIL) 5 MG tablet Take 1 tablet (5 mg total) by mouth daily. 90 tablet 3  . metoprolol succinate (TOPROL-XL) 25 MG 24 hr tablet TAKE 1 TABLET BY MOUTH ONCE DAILY 90 tablet 3  . Multiple Vitamin (MULTIVITAMIN) tablet Take 1 tablet by mouth daily. 90 tablet 3  . vitamin B-12 (CYANOCOBALAMIN) 1000 MCG tablet Take 1 tablet (1,000 mcg  total) by mouth daily. 90 tablet 0  . warfarin (COUMADIN) 3 MG tablet TAKE AS DIRECTED BY COUMADIN CLINIC 50 tablet 3   No current facility-administered medications for this visit.     Physical Exam Constitutional:      General: He is not in acute distress. Eyes:     General: No scleral icterus.    Conjunctiva/sclera: Conjunctivae normal.     Comments: Blind left eye  Neck:     Musculoskeletal: Neck supple.  Cardiovascular:     Rate and Rhythm: Normal rate and regular rhythm.     Heart sounds: No murmur.  Pulmonary:     Effort: Pulmonary effort is normal. No respiratory distress.     Breath sounds: No wheezing or rales.     Comments: Diminished breath sounds bilaterally Abdominal:     General: There is no distension.     Palpations: Abdomen is soft.     Tenderness: There is no abdominal tenderness.  Musculoskeletal:        General: No swelling.  Lymphadenopathy:     Cervical: No cervical adenopathy.  Skin:    General: Skin is warm and dry.  Neurological:     General: No focal deficit present.     Mental Status: He is alert and oriented to person, place, and time.     Motor: No weakness.     Coordination: Coordination normal.    BP 124/66   Pulse 72   Resp 20   Ht 5\' 11"  (1.803 m)   Wt 234 lb (106.1 kg)   SpO2 96% Comment: RA  BMI 32.64 kg/m   Diagnostic Tests: CT CHEST WITHOUT CONTRAST  TECHNIQUE: Multidetector CT imaging of the chest was performed following the standard protocol without IV contrast.  COMPARISON:  09/06/2016 and previous  FINDINGS: Cardiovascular: Heart size normal. No pericardial effusion. Extensive coronary calcifications. Previous CABG. Thoracic aortic transverse diameters as follows:  4.5 cm sinuses of Valsalva  4 cm sino-tubular junction  4.6 mid ascending (stable)  4.1 cm distal ascending/proximal arch  3 cm distal arch  3.3 cm proximal descending  3.5 cm mid descending  2.9 cm distal  descending  Scattered calcified atheromatous plaque through the arch and descending thoracic segment as well as in the visualized proximal abdominal aorta.  Mediastinum/Nodes: No hilar or mediastinal adenopathy localized, sensitivity  decreased without IV contrast.  Lungs/Pleura: No pleural effusion. No pneumothorax. 2.4 cm lobular mass/consolidation medially at the right apex, new since previous. Lungs otherwise clear.  Upper Abdomen: No acute findings.  Musculoskeletal: Left shoulder DJD. Spondylitic changes in the visualized lower cervical and lower thoracic spine. Prior median sternotomy.  IMPRESSION: 1. Stable 4.6 cm ascending thoracic aortic aneurysm. Recommend semi-annual imaging followup by CTA or MRA and referral to cardiothoracic surgery if not already obtained. This recommendation follows 2010 ACCF/AHA/AATS/ACR/ASA/SCA/SCAI/SIR/STS/SVM Guidelines for the Diagnosis and Management of Patients With Thoracic Aortic Disease. Circulation. 2010; 121: G254-Y706 2. New 2.4 cm mass/consolidation medially at the right apex, neoplasm versus pneumonia. Follow-up recommended. These results will be called to the ordering clinician or representative by the Radiologist Assistant, and communication documented in the PACS or zVision Dashboard.   Electronically Signed   By: Lucrezia Europe M.D.   On: 04/18/2018 10:43 I personally reviewed the CT images and concur with the findings noted above  Impression: Jaisean Monteforte is a 73 year old gentleman with a history of heavy tobacco abuse, COPD, coronary disease, carotid artery occlusion, abdominal aortic aneurysm, ascending thoracic aortic aneurysm, thoracic aortic atherosclerosis, paroxysmal atrial fibrillation for which he is on Coumadin, stage III chronic kidney disease, arthritis, as well as numerous other medical problems.  He recently had a CT to follow-up on a 4.6 cm ascending aneurysm.  That aneurysm was stable, however he was  found to have a new right upper lobe lung mass.  Given his history of heavy tobacco abuse primary bronchogenic carcinoma has to be the primary consideration.  It is a relatively short interval since his last CT however reviewing that CT the apex is not seen as clearly, so there could been a smaller lesion at that time.  Clearly this is new going back a couple of years.  I reviewed the CT images with Mr. Noda and we discussed the differential diagnosis.  We discussed potential modes of biopsy including navigational bronchoscopy and CT-guided biopsy.  While technically feasible, this would be very difficult to access with navigational bronchoscopy.  It is a 180 degree turn and peripheral.  This can usually be accomplished but there is no guarantee that we be able to get to the lesion.  Will discuss with interventional radiology.  If they feel a CT-guided biopsy is feasible that might be a better option.  Prior to any biopsy he needs a PET/CT to guide our initial diagnostic work-up.  He is a poor surgical candidate but could be a candidate for stereotactic radiation.  I discussed the general nature of navigational bronchoscopy with Mr. Federici.  He understands to do this in the operating room under general anesthesia and there are risks associated with that.  He knows there is no guarantee that we would be able to access the nodule in this location.  I informed him of the indications, risk, benefits, and alternatives.  He understands the risks include but are not limited to death, MI, stroke, blood clots, bleeding, and pneumothorax.  He accepts those risks and wishes to proceed.  We will schedule tentatively for 06/07/2018 unless we decide to proceed with a CT-guided biopsy.  He will need to hold his Coumadin for 5 days prior to the procedure.  Plan: PET/CT to guide initial diagnostic work-up We will discuss with interventional radiology whether CT-guided biopsy feasible.  If CT-guided biopsy not  feasible will attempt electromagnetic navigational bronchoscopy for biopsy and fiducial placement. He will need to hold Coumadin for 5 days  prior to biopsy  Melrose Nakayama, MD [facility] [phone]

## 2018-05-27 ENCOUNTER — Telehealth: Payer: Self-pay | Admitting: Pharmacist

## 2018-05-27 NOTE — Telephone Encounter (Signed)
Jettie Booze, MD  [place], Harlon Flor, [facility]        Jinny Blossom,   What do you think of a Lovenox bridge for this patient. ?   JV   Previous Messages    ----- Message -----  From: Melrose Nakayama, MD  Sent: 05/23/2018  3:46 PM EST  To: Jettie Booze, MD   Lelon Perla saw Cherie Dark in the office today. Bryan sent him to me because of a new right upper lobe nodule. We are considering doing a navigational bronchoscopy. He is on Coumadin for paroxysmal atrial fibrillation and he would need to hold that for 5 days prior to the procedure. I wanted to make sure that was okay with you and see whether you thought we needed to do a Lovenox bridge.   Thank you   Richardson Landry

## 2018-05-27 NOTE — Telephone Encounter (Signed)
LMOM for pt again.

## 2018-05-27 NOTE — Telephone Encounter (Addendum)
Pt takes warfarin for afib with CHADS2VASc score of 5 (age, HTN, CAD, stroke). He will require bridging with Lovenox in order to hold his warfarin for 5 days. Will coordinate bridge in Coumadin clinic with pt. LMOM for pt - will need to move next Coumadin appt up a week.

## 2018-05-28 NOTE — Telephone Encounter (Signed)
Pt walked in today - moved Coumadin appt to tomorrow afternoon to coordinate Lovenox bridge.

## 2018-05-29 ENCOUNTER — Ambulatory Visit (INDEPENDENT_AMBULATORY_CARE_PROVIDER_SITE_OTHER): Payer: Medicare HMO | Admitting: *Deleted

## 2018-05-29 DIAGNOSIS — I480 Paroxysmal atrial fibrillation: Secondary | ICD-10-CM

## 2018-05-29 DIAGNOSIS — Z5181 Encounter for therapeutic drug level monitoring: Secondary | ICD-10-CM | POA: Diagnosis not present

## 2018-05-29 LAB — POCT INR: INR: 3.3 — AB (ref 2.0–3.0)

## 2018-05-29 MED ORDER — ENOXAPARIN SODIUM 100 MG/ML ~~LOC~~ SOLN: 100.0000 mg | Freq: Two times a day (BID) | SUBCUTANEOUS | 1 refills | Status: DC

## 2018-05-29 NOTE — Patient Instructions (Addendum)
Coumadin Clinic [phone] if we don't answer please leave a message and we will call you back.   Description   No Warfarin today then start taking 2 tablets daily except 1 tablet on Tuesdays and Saturdays. Recheck in 1 week after procedure.  Please call with any changes in medications or new medications such as Prednisone or Antibiotics or if scheduled for any procedures [phone]    06/01/2018: Last dose of Coumadin.  06/02/2018: No Coumadin or Lovenox.  06/03/2018: Inject Lovenox 100mg  in the fatty abdominal tissue at least 2 inches from the belly button twice a day about 12 hours apart, 8am and 8pm rotate sites. No Coumadin.  06/04/2018: Inject Lovenox in the fatty tissue every 12 hours, 8am and 8pm. No Coumadin.  06/05/2018: Inject Lovenox in the fatty tissue every 12 hours, 8am and 8pm. No Coumadin.  06/06/2018: Inject Lovenox in the fatty tissue in the morning at 8 am (No PM dose). No Coumadin.  06/07/2018: Procedure Day - No Lovenox - Resume Coumadin in the evening or as directed by doctor.  06/08/2018: Resume Lovenox inject in the fatty tissue every 12 hours, at 8am and 8pm and take Coumadin.  06/09/2018: Inject Lovenox in the fatty tissue every 12 hours, at 8am and 8pm and take Coumadin.  06/10/2018: Inject Lovenox in the fatty tissue every 12 hours, at 8am and 8pm and take Coumadin.  06/11/2018: Inject Lovenox in the fatty tissue every 12 hours, at 8am and 8pm and take Coumadin.  06/12/2018: Inject Lovenox in the fatty tissue every 12 hours, at 8am and 8pm and take Coumadin.  06/13/2018: Inject Lovenox in the fatty tissue in the AM and report to Coumadin appt to check INR.

## 2018-05-30 ENCOUNTER — Other Ambulatory Visit: Payer: Self-pay | Admitting: *Deleted

## 2018-05-30 ENCOUNTER — Encounter: Payer: Self-pay | Admitting: Thoracic Surgery (Cardiothoracic Vascular Surgery)

## 2018-05-30 ENCOUNTER — Ambulatory Visit (INDEPENDENT_AMBULATORY_CARE_PROVIDER_SITE_OTHER): Payer: Medicare HMO | Admitting: Thoracic Surgery (Cardiothoracic Vascular Surgery)

## 2018-05-30 ENCOUNTER — Other Ambulatory Visit: Payer: Self-pay

## 2018-05-30 VITALS — BP 130/88 | HR 63 | Resp 18 | Ht 71.0 in | Wt 232.6 lb

## 2018-05-30 DIAGNOSIS — R911 Solitary pulmonary nodule: Secondary | ICD-10-CM

## 2018-05-30 NOTE — Progress Notes (Signed)
      [address]       [place] [address]             [phone]       [name] returns with multiple questions regarding his lung nodule and work-up  Dylan Morgan is a 73 year old gentleman with a past history of hypertension, hyperlipidemia, coronary disease, coronary bypass grafting in 2004, non-ST elevation MI in 2018, paroxysmal atrial fibrillation, stroke, stage III chronic kidney disease, and EVAR.  He has a 100-pack-year history of smoking.  Dr. Cyndia Bent has been following him for a 4.6 cm ascending aneurysm.  On a recent CT of the chest his aneurysm was unchanged but he had a new 2.3 cm right apical lung nodule.  I saw him last week to discuss biopsy of the lesion.  Navigational bronchoscopy is technically feasible but there is not a high probability of getting a definitive answer given the apical location of the nodule.  I think a CT-guided biopsy would be preferable if feasible.  I discussed this in detail with [name] previously but he did not recall the discussion.  He is not a candidate for primary surgical resection.  I discussed the case with Dr. Francena Hanly of interventional radiology.  He would like to see a PET/CT before deciding whether to proceed with a CT-guided biopsy.  PET CT has been ordered and is scheduled for 06/04/2018.  I discussed with [name] and his daughter-in-law as well as his daughter by telephone the CT findings and differential diagnosis.  While infectious and inflammatory nodules are in the differential, this is almost certainly a new primary bronchogenic carcinoma given his age, appearance of the nodule and smoking history.  It has to be considered that unless it can be proven otherwise.  He did express understanding of this.  He will need to hold Coumadin for 5 days prior to biopsy.  The plan will be to proceed with PET/CT on 06/04/2018.  After review of that he will have a CT-guided biopsy unless there is another alternative that would  have a higher probability of yielding a definitive diagnosis.  He expresses an understanding of that.  We will follow-up with Dr. Cyndia Bent after his biopsy  I spent 15 minutes with [name] reviewing images and discussing management during this visit.

## 2018-05-31 ENCOUNTER — Telehealth: Payer: Self-pay | Admitting: Pharmacist

## 2018-05-31 ENCOUNTER — Other Ambulatory Visit: Payer: Self-pay | Admitting: *Deleted

## 2018-05-31 NOTE — Telephone Encounter (Signed)
Patient walked into the clinic stating he cannot afford the lovenox for his bridge. Will change the dosing to once a day since patient does not have a mechanical valve and will provide a few samples and a good rx coupon. Patient states he does not get his check until the 3rd. We have given him enough syringes that he should not need to pay for the lovenox until the day after his procedure 3/7. Explained directions to patient since they are different than the original BID dosing directions. Emphasized the importance of the lovenox injections and why we use them.  2/29: Last dose of Coumadin.  3/1: No Coumadin or Lovenox.  3/2: Inject Lovenox 150mg  in the fatty abdominal tissue at least 2 inches from the belly button once a day at 7pm rotate sites. No Coumadin.  3/3: Inject Lovenox in the fatty tissue once a day at 7pm rotate sites. No Coumadin..  3/4: Inject Lovenox in the fatty tissue once a day at 7pm rotate sites. No Coumadin.  3/5: No Lovenox, No Coumadin.  3/6 : Procedure Day - No Lovenox - Resume Coumadin in the evening or as directed by doctor  3/7: Resume Lovenox inject in the fatty tissue once a day at 8AM and take Coumadin.  3/8: Inject Lovenox in the fatty tissue once a day at 8AM and take Coumadin.  3/9:  Inject Lovenox in the fatty tissue once a day at 8AM and take Coumadin.  3/10:  Inject Lovenox in the fatty tissue once a day at 8AM and take Coumadin.  3/11:  Inject Lovenox in the fatty tissue once a day at 8AM and take Coumadin.  3/12:  Coumadin appt to check INR.

## 2018-06-03 ENCOUNTER — Other Ambulatory Visit: Payer: Self-pay | Admitting: Cardiology

## 2018-06-04 ENCOUNTER — Telehealth: Payer: Self-pay | Admitting: Pharmacist

## 2018-06-04 ENCOUNTER — Encounter (HOSPITAL_COMMUNITY)
Admission: RE | Admit: 2018-06-04 | Discharge: 2018-06-04 | Disposition: A | Discharge status: Home / Self Care | Payer: Medicare HMO | Source: Ambulatory Visit | Attending: Thoracic Surgery (Cardiothoracic Vascular Surgery) | Admitting: Thoracic Surgery (Cardiothoracic Vascular Surgery)

## 2018-06-04 DIAGNOSIS — R911 Solitary pulmonary nodule: Secondary | ICD-10-CM | POA: Diagnosis not present

## 2018-06-04 LAB — GLUCOSE, CAPILLARY: Glucose-Capillary: 105 mg/dL — ABNORMAL HIGH (ref 70–99)

## 2018-06-04 MED ORDER — ENOXAPARIN SODIUM 150 MG/ML ~~LOC~~ SOLN: 150.0000 mg | SUBCUTANEOUS | 1 refills | Status: DC

## 2018-06-04 MED ORDER — FLUDEOXYGLUCOSE F - 18 (FDG) INJECTION
12.3000 | Freq: Once | INTRAVENOUS | Status: AC | PRN
  Administered 2018-06-04: 12.3 via INTRAVENOUS

## 2018-06-04 NOTE — Telephone Encounter (Signed)
Pt walked into the Clinic at 448pm and stated he needed some Lovenox shots. Spoke with pt and he states his procedure is still scheduled for 06/07/2018, per Epic the procedure has been canceled, since Dr. Leonarda Salon office is closed at this time, will have to confirm tomorrow if pt is having procedure or not. Pt will continue his Lovenox injections and he will also call to see if he will be having the procedure on 06/07/2018. Pt was instructed to call us at [phone] and it was writing down so pt has the right number. Also, the Lovenox injections refill was sent in at this time. Pt is aware not to restart Coumadin until we find out about the procedure date and he will continue taking Lovenox.

## 2018-06-04 NOTE — Telephone Encounter (Signed)
Received message from front desk staff that pt walked in to clinic yesterday asking for 2 Lovenox samples and a new prescription. Upon further investigation, looks as though upcoming procedure on 3/6 was canceled.  LMOM for pt to discuss.

## 2018-06-05 NOTE — Telephone Encounter (Signed)
Patient called and stated that his procedure is now 3/17. He did take his coumadin last night and used his last lovenox injection. He missed 2 doses 3/1 and 3/2. Patient was instructed to take 2.5 tablets tonight then continue his home dose. Continue lovenox injections (Rx sent to pharmacy and coupon given). Appointment made for Friday AM for INR check

## 2018-06-06 ENCOUNTER — Other Ambulatory Visit (HOSPITAL_COMMUNITY): Payer: Medicare HMO

## 2018-06-07 ENCOUNTER — Ambulatory Visit (INDEPENDENT_AMBULATORY_CARE_PROVIDER_SITE_OTHER): Payer: Medicare HMO | Admitting: *Deleted

## 2018-06-07 ENCOUNTER — Ambulatory Visit: Admit: 2018-06-07 | Payer: Medicare HMO | Admitting: Thoracic Surgery (Cardiothoracic Vascular Surgery)

## 2018-06-07 DIAGNOSIS — Z5181 Encounter for therapeutic drug level monitoring: Secondary | ICD-10-CM

## 2018-06-07 DIAGNOSIS — I480 Paroxysmal atrial fibrillation: Secondary | ICD-10-CM

## 2018-06-07 LAB — POCT INR: INR: 1.9 — AB (ref 2.0–3.0)

## 2018-06-07 SURGERY — VIDEO BRONCHOSCOPY WITH ENDOBRONCHIAL NAVIGATION
Anesthesia: General | Laterality: Right

## 2018-06-07 NOTE — Patient Instructions (Signed)
Description   Continue taking 2 tablets daily except 1 tablet on Tuesdays and Saturdays. Recheck in 1 week to prepare for the procedure on 06/18/2018.  Please call with any changes in medications or new medications such as Prednisone or Antibiotics or if scheduled for any procedures [phone]

## 2018-06-10 ENCOUNTER — Ambulatory Visit (INDEPENDENT_AMBULATORY_CARE_PROVIDER_SITE_OTHER): Payer: Medicare HMO | Admitting: Pharmacist

## 2018-06-10 DIAGNOSIS — I480 Paroxysmal atrial fibrillation: Secondary | ICD-10-CM

## 2018-06-10 DIAGNOSIS — Z5181 Encounter for therapeutic drug level monitoring: Secondary | ICD-10-CM

## 2018-06-10 LAB — POCT INR: INR: 2.3 (ref 2.0–3.0)

## 2018-06-10 MED ORDER — ENOXAPARIN SODIUM 150 MG/ML ~~LOC~~ SOLN: 150.0000 mg | SUBCUTANEOUS | 0 refills | Status: DC

## 2018-06-10 NOTE — Patient Instructions (Addendum)
Hold coumadin 3/13-3/16.Take 2 tablets on 3/17 if ok per Dr. Bettina Gavia Lovenox bridge instructions. Continue taking 2 tablets daily except 1 tablet on Tuesdays and Saturdays.  Please call with any changes in medications or new medications such as Prednisone or Antibiotics or if scheduled for any procedures [phone]  3/12: Last dose of Coumadin.  3/13:No Coumadin or Lovenox.  3/14:  Inject Lovenox 150mg  in the fatty abdominal tissue at least 2 inches from the belly button once a day at 10 pm rotate sites. No Coumadin.  3/15: Inject Lovenox in the fatty tissue once a day at 10 pm rotate sites. No Coumadin.  3/16: No Lovenox, No Coumadin.  3/17 : Procedure Day - No Lovenox - Resume Coumadin in the evening or as directed by doctor (take 2 tablets of coumadin)  3/18: Resume Lovenox inject in the fatty tissue once a day at 8AM and take Coumadin.  3/19: Inject Lovenox in the fatty tissue once a day at 8AM and take Coumadin.  3/20:  Inject Lovenox in the fatty tissue once a day at 8AM and take Coumadin.  3/21:  Inject Lovenox in the fatty tissue once a day at 8AM and take Coumadin.  3/22:  Inject Lovenox in the fatty tissue once a day at 8AM and take Coumadin.  3/23:  Coumadin appt to check INR.

## 2018-06-10 NOTE — Progress Notes (Signed)
3/12: Last dose of Coumadin.  3/13:No Coumadin or Lovenox.  3/14:  Inject Lovenox 150mg  in the fatty abdominal tissue at least 2 inches from the belly button once a day at 10 pm rotate sites. No Coumadin.  3/15: Inject Lovenox in the fatty tissue once a day at 10 pm rotate sites. No Coumadin.  3/16: No Lovenox, No Coumadin.  3/17 : Procedure Day - No Lovenox - Resume Coumadin in the evening or as directed by doctor (take 2 tablets of coumadin)  3/18: Resume Lovenox inject in the fatty tissue once a day at 8AM and take Coumadin.  3/19: Inject Lovenox in the fatty tissue once a day at 8AM and take Coumadin.  3/20:  Inject Lovenox in the fatty tissue once a day at 8AM and take Coumadin.  3/21:  Inject Lovenox in the fatty tissue once a day at 8AM and take Coumadin.  3/22:  Inject Lovenox in the fatty tissue once a day at 8AM and take Coumadin.  3/23:  Coumadin appt to check INR. -

## 2018-06-11 ENCOUNTER — Encounter: Payer: Self-pay | Admitting: Internal Medicine

## 2018-06-11 ENCOUNTER — Other Ambulatory Visit: Payer: Self-pay

## 2018-06-11 ENCOUNTER — Ambulatory Visit (INDEPENDENT_AMBULATORY_CARE_PROVIDER_SITE_OTHER): Payer: Medicare HMO | Admitting: Internal Medicine

## 2018-06-11 VITALS — BP 116/61 | HR 64 | Temp 97.6°F | Ht 71.0 in | Wt 230.4 lb

## 2018-06-11 DIAGNOSIS — Z951 Presence of aortocoronary bypass graft: Secondary | ICD-10-CM | POA: Diagnosis not present

## 2018-06-11 DIAGNOSIS — Z79899 Other long term (current) drug therapy: Secondary | ICD-10-CM

## 2018-06-11 DIAGNOSIS — Z87891 Personal history of nicotine dependence: Secondary | ICD-10-CM | POA: Diagnosis not present

## 2018-06-11 DIAGNOSIS — N183 Chronic kidney disease, stage 3 unspecified: Secondary | ICD-10-CM

## 2018-06-11 DIAGNOSIS — I480 Paroxysmal atrial fibrillation: Secondary | ICD-10-CM

## 2018-06-11 DIAGNOSIS — I712 Thoracic aortic aneurysm, without rupture, unspecified: Secondary | ICD-10-CM

## 2018-06-11 DIAGNOSIS — E538 Deficiency of other specified B group vitamins: Secondary | ICD-10-CM

## 2018-06-11 DIAGNOSIS — I714 Abdominal aortic aneurysm, without rupture, unspecified: Secondary | ICD-10-CM

## 2018-06-11 DIAGNOSIS — I6523 Occlusion and stenosis of bilateral carotid arteries: Secondary | ICD-10-CM

## 2018-06-11 DIAGNOSIS — I10 Essential (primary) hypertension: Secondary | ICD-10-CM

## 2018-06-11 DIAGNOSIS — I129 Hypertensive chronic kidney disease with stage 1 through stage 4 chronic kidney disease, or unspecified chronic kidney disease: Secondary | ICD-10-CM | POA: Diagnosis not present

## 2018-06-11 DIAGNOSIS — I2510 Atherosclerotic heart disease of native coronary artery without angina pectoris: Secondary | ICD-10-CM | POA: Diagnosis not present

## 2018-06-11 DIAGNOSIS — J302 Other seasonal allergic rhinitis: Secondary | ICD-10-CM

## 2018-06-11 DIAGNOSIS — R911 Solitary pulmonary nodule: Secondary | ICD-10-CM | POA: Diagnosis not present

## 2018-06-11 DIAGNOSIS — R062 Wheezing: Secondary | ICD-10-CM

## 2018-06-11 DIAGNOSIS — I25119 Atherosclerotic heart disease of native coronary artery with unspecified angina pectoris: Secondary | ICD-10-CM

## 2018-06-11 DIAGNOSIS — Z7901 Long term (current) use of anticoagulants: Secondary | ICD-10-CM

## 2018-06-11 MED ORDER — ALBUTEROL SULFATE HFA 108 (90 BASE) MCG/ACT IN AERS: 2.0000 | INHALATION_SPRAY | Freq: Four times a day (QID) | RESPIRATORY_TRACT | 2 refills | Status: AC | PRN

## 2018-06-11 MED ORDER — LORATADINE 10 MG PO TABS: 10.0000 mg | ORAL_TABLET | Freq: Every day | ORAL | 2 refills | Status: AC

## 2018-06-11 NOTE — Patient Instructions (Signed)
-   It was a pleasure seeing you today -Please follow-up for your lung biopsy -I will send a refill of your allergy pills for you -We will check some blood work on you today -Please call me if you have any questions or if you need any refills

## 2018-06-11 NOTE — Assessment & Plan Note (Addendum)
-  This problem is chronic and stable -Patient has history of CAD status post CABG and follows with cardiology for this -We will continue with Toprol-XL.  Lipitor 80 mg and TriCor -Patient was previously on Imdur as well as clopidogrel.  I do not see these on his medication list currently.  Will have patient follow-up with cardiology for this to see if things continue these medications are not -No further work-up at this time

## 2018-06-11 NOTE — Progress Notes (Signed)
   Subjective:    Patient ID: Dylan Morgan, male    DOB: 09-13-1945, 73 y.o.   MRN: [id number]  HPI I have seen and examined this patient.  Patient is here for routine follow-up of his hypertension and CKD.  Patient was recently diagnosed with a lung nodule suspicious for bronchogenic carcinoma.  He is scheduled to have a lung biopsy on March 17 for this.  He expresses some doubt about having this procedure done.  He denies any other complaints currently and states that he feels well otherwise.  He is compliant with his medications.   Review of Systems  Constitutional: Negative.   HENT: Negative.   Respiratory: Negative.   Cardiovascular: Negative.   Gastrointestinal: Negative.   Musculoskeletal: Negative.   Neurological: Negative.   Psychiatric/Behavioral: Negative.        Objective:   Physical Exam Constitutional:      Appearance: Normal appearance.  HENT:     Head: Normocephalic and atraumatic.     Mouth/Throat:     Mouth: Mucous membranes are moist.     Pharynx: Oropharynx is clear.  Eyes:     Pupils: Pupils are equal, round, and reactive to light.  Neck:     Musculoskeletal: Neck supple.  Cardiovascular:     Rate and Rhythm: Normal rate and regular rhythm.     Heart sounds: Normal heart sounds.  Pulmonary:     Effort: Pulmonary effort is normal.     Breath sounds: Wheezing present.     Comments: Bilateral expiratory wheeze noted on exam Abdominal:     General: Bowel sounds are normal. There is no distension.     Palpations: Abdomen is soft.  Musculoskeletal: Normal range of motion.        General: No swelling.  Lymphadenopathy:     Cervical: No cervical adenopathy.  Neurological:     General: No focal deficit present.     Mental Status: He is alert and oriented to person, place, and time.  Psychiatric:        Mood and Affect: Mood normal.        Behavior: Behavior normal.           Assessment & Plan:  Please see problem based charting for assessment  and plan:

## 2018-06-11 NOTE — Assessment & Plan Note (Signed)
-  This problem is chronic and stable -Patient had a repeat carotid artery duplex done last year and will need another one done again this year.  His carotid duplex done last year showed unchanged carotid disease (right ICA 40 to 59% occlusion, left ICA old occlusion) -Patient follow-up with vascular surgery later this year

## 2018-06-11 NOTE — Assessment & Plan Note (Signed)
-  This problem is chronic and stable -We will recheck patient's vitamin B12 level today -Patient states that he is compliant with B12 supplementation at home -No further work-up at this time

## 2018-06-11 NOTE — Assessment & Plan Note (Signed)
-  Patient was noted to have a new 2.4 cm lobular mass on a CT of his chest which was done to evaluate for his ascending aortic aneurysm -He also had a PET scan done which showed increased uptake in right apical lung nodule compatible with primary bronchogenic carcinoma -Patient scheduled to have a biopsy done of this nodule on March 17 -He had an extensive discussion about this nodule today and about the importance of having a biopsy done in order to evaluate what his treatment options may be -Patient expresses understanding and is in agreement with plan -No further work-up at this time

## 2018-06-11 NOTE — Assessment & Plan Note (Signed)
-  This problem is chronic and stable -Patient's last creatinine was 1.4 which is his baseline -We will recheck a BMP today -No further work-up at this time

## 2018-06-11 NOTE — Assessment & Plan Note (Signed)
-  This problem is chronic and stable -Patient follow-up with vascular surgery later in the year -His AAA was stable (4.4 cm) on his last imaging -No further work-up at this time

## 2018-06-11 NOTE — Assessment & Plan Note (Signed)
-  This problem is chronic and stable -Patient followed up with cardiothoracic surgery in January and was noted to have a stable 4.6 cm ascending aortic aneurysm. -We will continue to follow-up with cardiothoracic surgery for this. -No need for surgery at current time. -No further work-up at this time

## 2018-06-11 NOTE — Assessment & Plan Note (Signed)
-  Noted to have bilateral expiratory wheeze on exam today -Patient has had multiple episodes of recurrent wheezing in the past -We will order an albuterol inhaler for him today -He will benefit from PFTs and I suspect that he likely has COPD given his history of smoking.  However, given the multiple test that he is currently scheduled for we will defer PFTs for now -We will revisit the possibility of obtaining PFTs on his next visit

## 2018-06-11 NOTE — Assessment & Plan Note (Signed)
-  This problem is chronic and stable -Patient states that he occasionally has a runny nose and would like a refill of his Claritin for this -We will refill his Claritin today -No further work-up at this time

## 2018-06-11 NOTE — Assessment & Plan Note (Signed)
-  This problem is chronic and stable -Patient denies any palpitations or lightheadedness currently -We will continue metoprolol and Coumadin for now -No further work-up at this time

## 2018-06-11 NOTE — Assessment & Plan Note (Signed)
BP Readings from Last 3 Encounters:  06/11/18 116/61  05/30/18 130/88  05/23/18 124/66    Lab Results  Component Value Date   NA 139 04/19/2018   K 4.1 04/19/2018   CREATININE 1.40 (H) 04/19/2018    Assessment: Blood pressure control:  Well controlled Progress toward BP goal:   At goal Comments: Patient is compliant with Toprol-XL 25 mg daily, lisinopril 5 mg daily  Plan: Medications:  continue current medications Educational resources provided:   Self management tools provided:   Other plans: We will check BMP today

## 2018-06-12 LAB — BMP8+ANION GAP
Anion Gap: 18 mmol/L (ref 10.0–18.0)
BUN/Creatinine Ratio: 20 (ref 10–24)
BUN: 30 mg/dL — ABNORMAL HIGH (ref 8–27)
CO2: 19 mmol/L — ABNORMAL LOW (ref 20–29)
Calcium: 9.5 mg/dL (ref 8.6–10.2)
Chloride: 106 mmol/L (ref 96–106)
Creatinine, Ser: 1.52 mg/dL — ABNORMAL HIGH (ref 0.76–1.27)
GFR calc Af Amer: 51 mL/min/{1.73_m2} — ABNORMAL LOW (ref >59)
GFR calc non Af Amer: 44 mL/min/{1.73_m2} — ABNORMAL LOW (ref >59)
Glucose: 101 mg/dL — ABNORMAL HIGH (ref 65–99)
Potassium: 4.9 mmol/L (ref 3.5–5.2)
SODIUM: 143 mmol/L (ref 134–144)

## 2018-06-12 LAB — VITAMIN B12: Vitamin B-12: 151 pg/mL — ABNORMAL LOW (ref 232–1245)

## 2018-06-13 ENCOUNTER — Encounter: Payer: Self-pay | Admitting: Internal Medicine

## 2018-06-17 ENCOUNTER — Other Ambulatory Visit: Payer: Self-pay | Admitting: Radiology

## 2018-06-17 ENCOUNTER — Other Ambulatory Visit: Payer: Self-pay | Admitting: Physician Assistant

## 2018-06-18 ENCOUNTER — Encounter (HOSPITAL_COMMUNITY): Payer: Self-pay

## 2018-06-18 ENCOUNTER — Ambulatory Visit (HOSPITAL_COMMUNITY): Admission: RE | Admit: 2018-06-18 | Payer: Medicare HMO | Source: Ambulatory Visit

## 2018-06-21 ENCOUNTER — Telehealth: Payer: Self-pay | Admitting: Cardiovascular Disease

## 2018-06-21 ENCOUNTER — Telehealth: Payer: Self-pay

## 2018-06-21 NOTE — Telephone Encounter (Signed)
lmom for prescreen  

## 2018-06-24 ENCOUNTER — Other Ambulatory Visit: Payer: Self-pay | Admitting: Pharmacist

## 2018-06-24 ENCOUNTER — Ambulatory Visit (INDEPENDENT_AMBULATORY_CARE_PROVIDER_SITE_OTHER): Payer: Medicare HMO | Admitting: Pharmacist

## 2018-06-24 ENCOUNTER — Other Ambulatory Visit: Payer: Self-pay

## 2018-06-24 DIAGNOSIS — I480 Paroxysmal atrial fibrillation: Secondary | ICD-10-CM

## 2018-06-24 DIAGNOSIS — Z5181 Encounter for therapeutic drug level monitoring: Secondary | ICD-10-CM | POA: Diagnosis not present

## 2018-06-24 LAB — POCT INR: INR: 8 — AB (ref 2.0–3.0)

## 2018-06-24 LAB — PROTIME-INR
INR: 9.2 (ref 0.8–1.2)
Prothrombin Time: 85.7 s — ABNORMAL HIGH (ref 9.1–12.0)

## 2018-06-24 NOTE — Patient Instructions (Signed)
Description   STAT INR back at 9.2. Pt not having any bleeding issues and reports nothing out of the ordinary. He is aware to hold warfarin all week and eat extra greens the next few days. He will go to the ER with any bleeding. Recheck INR on Friday - he is aware of drive up service. Discuss switching to DOAC.

## 2018-06-26 ENCOUNTER — Ambulatory Visit: Payer: Medicare HMO | Admitting: Surgery

## 2018-06-26 ENCOUNTER — Telehealth: Payer: Self-pay

## 2018-06-26 NOTE — Telephone Encounter (Signed)
LMOM FOR PRESCREEN AND DRIVE THRU

## 2018-06-28 ENCOUNTER — Other Ambulatory Visit: Payer: Self-pay

## 2018-06-28 ENCOUNTER — Ambulatory Visit (INDEPENDENT_AMBULATORY_CARE_PROVIDER_SITE_OTHER): Payer: Medicare HMO | Admitting: Pharmacist

## 2018-06-28 DIAGNOSIS — I480 Paroxysmal atrial fibrillation: Secondary | ICD-10-CM | POA: Diagnosis not present

## 2018-06-28 DIAGNOSIS — Z5181 Encounter for therapeutic drug level monitoring: Secondary | ICD-10-CM | POA: Diagnosis not present

## 2018-06-28 LAB — POCT INR: INR: 2.3 (ref 2.0–3.0)

## 2018-06-28 MED ORDER — APIXABAN 5 MG PO TABS: 5.0000 mg | ORAL_TABLET | Freq: Two times a day (BID) | ORAL | 3 refills | Status: DC

## 2018-06-28 NOTE — Progress Notes (Signed)
Pt was started on Eliquis for afib on 06/28/18  Reviewed patients medication list.  Pt is not currently on any combined P-gp and strong CYP3A4 inhibitors/inducers (ketoconazole, traconazole, ritonavir, carbamazepine, phenytoin, rifampin, St. John's wort).  Reviewed labs.  SCr 1.4, Weight 104 kg.  Dose appropriate based on age, weight, and SCr.  Hgb and HCT Within Normal Limits  A full discussion of the nature of anticoagulants has been carried out.  A benefit/risk analysis has been presented to the patient, so that they understand the justification for choosing anticoagulation with Eliquis at this time.  The need for compliance is stressed.  Pt is aware to take the medication twice daily.  Side effects of potential bleeding are discussed, including unusual colored urine or stools, coughing up blood or coffee ground emesis, nose bleeds or serious fall or head trauma.  Discussed signs and symptoms of stroke. The patient should avoid any OTC items containing aspirin or ibuprofen.  Avoid alcohol consumption.   Call if any signs of abnormal bleeding.  Discussed financial obligations and resolved any difficulty in obtaining medication.  Next lab test in 3 months.

## 2018-06-28 NOTE — Patient Instructions (Signed)
Description   Do not take your warfarin today. We will be start apixaban 5 mg twice daily tomorrow evening. We discussed the deductible and copay associated with it, he feels it is reasonable. Will send in Rx with 30 day free card. Please call if you have any questions or concerns.

## 2018-07-17 ENCOUNTER — Other Ambulatory Visit: Payer: Medicare HMO

## 2018-07-18 ENCOUNTER — Other Ambulatory Visit: Payer: Self-pay | Admitting: Internal Medicine

## 2018-07-19 NOTE — Telephone Encounter (Signed)
plavix no longer on medication list.  Chart reflects eliquis and lovenox.   Call made to pt to confirm medications, no answer-CMA left contact number on recorder.Morgan, Dylan Cassady4/17/202011:52 AM   Pt followed by cardiology-plavix refill denied at this time.Regenia Skeeter, Dylan Cassady4/17/202011:56 AM

## 2018-07-29 ENCOUNTER — Telehealth: Payer: Self-pay | Admitting: Licensed Clinical Social Worker

## 2018-07-29 ENCOUNTER — Telehealth: Payer: Self-pay | Admitting: Interventional Cardiology

## 2018-07-29 NOTE — Telephone Encounter (Signed)
Pt calling stating that he is unable to afford his medication of Eliquis and that he needed some medication that he is out of it. I think the pt needs a prior auth. Please address

## 2018-07-29 NOTE — Telephone Encounter (Signed)
CSW received referral to assist patient with financial assistance for Eliquis. CSW contacted patient and left message for return call. Raquel Sarna, [place], [place]

## 2018-07-29 NOTE — Telephone Encounter (Signed)
**Note De-Identified  Obfuscation** I called [facility] and was advised that the pts Eliquis will cost him $214.73 for a 30 day supply and that it looks like he has a deductible that has not been met and that a PA is not required as his insurance paid their part of the $564 cost.  I called the pt who states that he does not know if he has a deductible.  I asked him to call his insurance company to see if he has a deductible, if so how much, and how much will Eliquis cost him once he meets his deductible.  He states that he will call them and then call me back with the requested information.

## 2018-07-29 NOTE — Telephone Encounter (Signed)
**Note De-Identified  Obfuscation** I called the pt back and he stated that he did call [employer] (his insurance carrier) and was advised that his deductible is over $100 (could not remember the actual amount) and he states that he cannot afford to pay that and that even if he could he cannot afford the cost Eliquis monthly.  I have advised him that we will leave him 2 bottles of Eliquis samples and a BMS pt asst application in the downstairs lobby of the [facility] location for him to pick up.  I have advised him to fill out the BMS pt asst application, obtain his 2019 proof of income and his 2020 out of pocket expense report from his pharmacy and to either mail his part into BMS or bring it to the office. If he mails it I have asked him to let us know so we can get the provider part of the application filled out and fax in.  Also, he is advised that I am referring him to our Social Worker Kennyth Lose for further asst with the cost of his Eliquis. He is aware that Kennyth Lose will reach out to him.  He verbalized understanding and thanked for for my help.

## 2018-07-30 NOTE — Telephone Encounter (Signed)
**Note De-Identified  Obfuscation** The pt is requesting to go back on Warfarin as Eliquis cost too much.  I have advised him that I am going to send a message to our pharmacist to see if Eliquis can be switched to Xarelto as it may be less expensive and hopefully he can afford.  He is aware that we will contact him with Xarelto dose if this change can be made.

## 2018-07-30 NOTE — Telephone Encounter (Signed)
Do you know if pt has filled out the BMS application for copay assistance? If he does not think he will qualify based on his finances, we can switch him back to warfarin. Xarelto would be the same price and have the same deductible as Eliquis unfortunately.

## 2018-07-30 NOTE — Telephone Encounter (Signed)
Follow up   Pt c/o medication issue:  1. Name of Medication:apixaban (ELIQUIS) 5 MG TABS tablet  2. How are you currently taking this medication (dosage and times per day)? Twice daily  3. Are you having a reaction (difficulty breathing--STAT)?no   4. What is your medication issue? Patient states that he can't afford the Eliquis and would like to see what his options would be to get another medication that is cheaper.

## 2018-07-30 NOTE — Telephone Encounter (Signed)
LMOM for patient to see if he will fill out his BMS application to see if he qualifies for free Eliquis. If he does not qualify or does not want to fill out the application, will need to switch back to warfarin.

## 2018-07-30 NOTE — Telephone Encounter (Signed)
Pt called again concerned about not being able to afford his medication of Eliquis. I asked the pt did he pick up the samples that was left for him at the front desk, pt stated yes. I advised the pt that Mardene Celeste Via, LPN, pt assistant coordinator is working on getting pt's prior auth done for this medication and 2 weeks supply of samples was left for pt until the coordinator has a chance to get back with him pt verbalized understanding.

## 2018-07-30 NOTE — Telephone Encounter (Signed)
No, he just picked up the application today.   I offered to help him with the BMS pt asst application but he said he can fill it out.  I also referred him to our social worker who has called the pt and left a message asking him to call her back but he has  not. He called the office and asked to be switched back to Warfarin instead.

## 2018-07-31 ENCOUNTER — Telehealth (HOSPITAL_COMMUNITY): Payer: Self-pay | Admitting: Licensed Clinical Social Worker

## 2018-07-31 NOTE — Telephone Encounter (Signed)
Follow up   Patient is returning call. Please call the patient.

## 2018-07-31 NOTE — Telephone Encounter (Signed)
The pt states that he will fill out his BMS Pt Asst application for assistance with his Eliquis. He is aware to provide his 2019 proof of income and his 2020 out of pocket expense report from his pharmacy along with his completed BMS Pt Asst application.  He also states that he will call Raquel Sarna, LCSW back to discuss other options for asst with the cost his Eliquis.

## 2018-07-31 NOTE — Telephone Encounter (Signed)
I called the pt to discuss his BMS pt asst application and to ask him to return [place] call but got no answer so I left a detailed message on his VM stating that it is important that he return my call as we need to discuss his anticoagulant options.

## 2018-07-31 NOTE — Telephone Encounter (Signed)
CSW referred to assist with medication assistance. CSW attempted to reach patient with no answer and message left for return call. CSW will inform office of inability to reach patient. Raquel Sarna, [place], [place]

## 2018-08-05 ENCOUNTER — Other Ambulatory Visit: Payer: Self-pay | Admitting: Interventional Cardiology

## 2018-08-05 NOTE — Telephone Encounter (Signed)
Called patient to see if he ever submitted the paperwork for patient assistance for Eliquis. Looks like Kennyth Lose attempted to reach out to him and he never answered. Left message on VM to return call to discuss patient assistance vs switching back to warfarin.

## 2018-08-05 NOTE — Telephone Encounter (Signed)
New message:    Patient calling concering some medication refill patient states the medication is to high and could not tell which medication he needed please call patient back.

## 2018-08-05 NOTE — Telephone Encounter (Signed)
Pt states he has been taking Eliquis but when he went to get it refilled it was too expensive ($791) for him to get at this time.   Pt would like to switch back to Coumadin because it was more affordable for him.   Will route to [place] for advisement.

## 2018-08-05 NOTE — Telephone Encounter (Signed)
OK to swtich back to COumadin due to cost.

## 2018-08-08 NOTE — Telephone Encounter (Signed)
Spoke with patient. He states he has filled out the patient assistance forms but hasn't dropped them off yet. Asked patient to drop off forms today so we can get them submitted ASAP. Will leave samples at the front desk (downstairs) for him. Also have him Jackie's phone number to see if she could be of more assistance to him. She tried calling him twice last week and he did not answer. He says he will call her.

## 2018-08-12 ENCOUNTER — Telehealth: Payer: Self-pay

## 2018-08-12 NOTE — Telephone Encounter (Signed)
Received pt asst form from pt. Completed physicians part and signed by Dr Angelena Form (DOD) and faxed to [facility]

## 2018-08-20 ENCOUNTER — Telehealth: Payer: Self-pay | Admitting: Licensed Clinical Social Worker

## 2018-08-20 NOTE — Telephone Encounter (Signed)
Letter received from BMS stating that they have denied the pt asst with his Eliquis. Reason: Product is cover by insurance.  I have forwarded a copy of the denial letter to Raquel Sarna, Social worker as I have referred him to her for help with the cost of Eliquis.

## 2018-08-20 NOTE — Telephone Encounter (Signed)
CSW received note that patient was denied for Patient Assistance Program for Eliquis. CSW contacted BMS and was informed that he has not yet meet the out of pocket expense for eligibility. Unfortunately, the staff member who reviewed the case was not available to give the out of pocket expense needed to qualify. CSW instructed to return call tomorrow for further assistance. Raquel Sarna, [place], [place]

## 2018-08-27 ENCOUNTER — Telehealth: Payer: Self-pay | Admitting: Licensed Clinical Social Worker

## 2018-08-27 NOTE — Telephone Encounter (Signed)
CSW contacted Eliquis Patient Assistance program and informed that patient was denied because he has not yet met the out of pocket expenses for the year. Patient will need to submit $378.98 in out of pocket household pharmacy expenses to Eliquis to meet the deductible. CSW attempted to reach patient to discuss but no answer and message left. Raquel Sarna, [place], [place]

## 2018-08-29 ENCOUNTER — Telehealth: Payer: Self-pay | Admitting: Licensed Clinical Social Worker

## 2018-08-29 NOTE — Telephone Encounter (Signed)
CSW contacted patient to discuss further the Patient Assistance program and his eligibility. CSW left message for return call. Raquel Sarna, [place], [place]

## 2018-09-02 ENCOUNTER — Telehealth: Payer: Self-pay | Admitting: Interventional Cardiology

## 2018-09-02 MED ORDER — APIXABAN 5 MG PO TABS: 5.0000 mg | ORAL_TABLET | Freq: Two times a day (BID) | ORAL | 0 refills | Status: DC

## 2018-09-02 NOTE — Telephone Encounter (Signed)
I will route to [facility] so she can follow up about patient assistance

## 2018-09-02 NOTE — Telephone Encounter (Signed)
Patient stopped in office to speak with Kennyth Lose aboiut a discount card patient needs to have refill on Eliquis as well.    *STAT* If patient is at the pharmacy, call can be transferred to refill team.   1. Which medications need to be refilled? (please list name of each medication and dose if known) eliquis  2. Which pharmacy/location (including street and city if local pharmacy) is medication to be sent to?[facility]  3. Do they need a 30 day or 90 day supply? [place]

## 2018-09-02 NOTE — Telephone Encounter (Signed)
Patient already used free month supply coupon. Not eligible to use 10 $ copay card due to medicare. I have sent over refill for 90 ds NR. Pt will need lab work prior to next refill.

## 2018-09-04 ENCOUNTER — Other Ambulatory Visit: Payer: Self-pay | Admitting: Pharmacist

## 2018-09-04 ENCOUNTER — Telehealth: Payer: Self-pay | Admitting: Interventional Cardiology

## 2018-09-04 ENCOUNTER — Other Ambulatory Visit: Payer: Self-pay

## 2018-09-04 ENCOUNTER — Other Ambulatory Visit: Payer: Self-pay | Admitting: *Deleted

## 2018-09-04 DIAGNOSIS — Z7901 Long term (current) use of anticoagulants: Secondary | ICD-10-CM

## 2018-09-04 LAB — COMPREHENSIVE METABOLIC PANEL
ALT: 12 IU/L (ref 0–44)
AST: 19 IU/L (ref 0–40)
Albumin/Globulin Ratio: 1.3 (ref 1.2–2.2)
Albumin: 3.9 g/dL (ref 3.7–4.7)
Alkaline Phosphatase: 131 IU/L — ABNORMAL HIGH (ref 39–117)
BUN/Creatinine Ratio: 19 (ref 10–24)
BUN: 31 mg/dL — ABNORMAL HIGH (ref 8–27)
Bilirubin Total: 0.5 mg/dL (ref 0.0–1.2)
CO2: 21 mmol/L (ref 20–29)
Calcium: 9.5 mg/dL (ref 8.6–10.2)
Chloride: 102 mmol/L (ref 96–106)
Creatinine, Ser: 1.67 mg/dL — ABNORMAL HIGH (ref 0.76–1.27)
GFR calc Af Amer: 46 mL/min/{1.73_m2} — ABNORMAL LOW (ref >59)
GFR calc non Af Amer: 39 mL/min/{1.73_m2} — ABNORMAL LOW (ref >59)
Globulin, Total: 3 g/dL (ref 1.5–4.5)
Glucose: 123 mg/dL — ABNORMAL HIGH (ref 65–99)
Potassium: 3.8 mmol/L (ref 3.5–5.2)
Sodium: 142 mmol/L (ref 134–144)
Total Protein: 6.9 g/dL (ref 6.0–8.5)

## 2018-09-04 LAB — CBC
Hematocrit: 39.2 % (ref 37.5–51.0)
Hemoglobin: 12.8 g/dL — ABNORMAL LOW (ref 13.0–17.7)
MCH: 29 pg (ref 26.6–33.0)
MCHC: 32.7 g/dL (ref 31.5–35.7)
MCV: 89 fL (ref 79–97)
Platelets: 191 10*3/uL (ref 150–450)
RBC: 4.41 x10E6/uL (ref 4.14–5.80)
RDW: 13.3 % (ref 11.6–15.4)
WBC: 3.9 10*3/uL (ref 3.4–10.8)

## 2018-09-04 NOTE — Telephone Encounter (Signed)
Spoke with Tana Coast, [facility] who ordered lab work for patient for today. Patient given 2 boxes of Eliquis samples. I asked if he has followed up with Social Worker who called him and he states he lost the phone number. I gave him phone number for Raquel Sarna, LCSW at [phone] and advised him that he will need to contact her as soon as possible due to we have to limit the amount of samples we give from the office. Patient verbalized understanding and agreement and thanked me for my help.

## 2018-09-04 NOTE — Telephone Encounter (Signed)
New message    Patient calling the office for samples of medication:   1.  What medication and dosage are you requesting samples for?eliquis  2.  Are you currently out of this medication?  Yes - he is out and he needs financial assistance as well to get medicatioin    Patient waiting in lobby

## 2018-09-04 NOTE — Telephone Encounter (Signed)
Spoke with Romie Minus who is downstairs in the lobby and advised that patient will need to have a seat and await further instruction. Talking with pharmacists regarding lab work that is needed from patient.

## 2018-09-05 ENCOUNTER — Telehealth: Payer: Self-pay | Admitting: Licensed Clinical Social Worker

## 2018-09-05 NOTE — Telephone Encounter (Signed)
Patient called back to share he went to the pharmacy and requested out of pocket expense report to be faxed to BM. CSW will follow up on Monday with Patient Assistance Program. Raquel Sarna, [place], [place]

## 2018-09-05 NOTE — Telephone Encounter (Signed)
CSW contacted patient to follow up on information obtained from the Pharmacist about his Eliquis. Message left for return call. Raquel Sarna, [place], [place]

## 2018-09-05 NOTE — Telephone Encounter (Signed)
CSW received return call from patient. CSW shared need for out of pocket expense report from the Pharmacy to be sent to [facility] Patient Assistance program. CSW with patient's permission called the [place] to obtain report and informed that patient will need to go in person and sign consent for [facility] to release and fax report. Patient verbalizes understanding of follow up and will go to pharmacy today. CSW requested patient to return call if needed from the pharmacy. CSW provided patient with the fax number to [facility] Patient Assistance to give to pharmacy. CSW available as needed. Raquel Sarna, [place], [place]

## 2018-09-05 NOTE — Telephone Encounter (Signed)
CSW received return call from [place] stating that patient only has $106 of out of pocket expenses for the year so far and would need to have a total of $378.98 to qualify for the Patient Assistance Program. The Pharmacist shared that a 90 day supply of Eliquis with current insurance will cost $135. CSW will contact patient to discuss further his finances to afford the co pay. Raquel Sarna, [place], [place]

## 2018-09-06 ENCOUNTER — Telehealth: Payer: Self-pay | Admitting: Pharmacist

## 2018-09-06 NOTE — Telephone Encounter (Signed)
Called pt to transition back to warfarin. He previously took 3mg  warfarin tablets and took 2 tablets daily except 1 tablet on Tuesdays and Saturdays. Will have him resume old dose and overlap with Eliquis for 3 days.   Left message with pt to review the above instructions. Will need INR check scheduled in 1-2 to weeks.

## 2018-09-06 NOTE — Telephone Encounter (Signed)
We can switch pt back to Coumadin - we had been screening many of our patients to switch to DOACs to help decrease office visits and exposure during [place]. We discussed his copay before switching to Eliquis and he felt it was reasonable at the time. If he is no longer able to afford it, we can switch him back to Coumadin. I will give pt a call to coordinate this.

## 2018-09-06 NOTE — Telephone Encounter (Signed)
-----   Message from [place], LPN sent at 09/03/2631  9:33 AM EDT ----- Regarding: RE: Eliquis Mariel Craft, I think with this patient Im going to talk with our Coumadin Clinic/pharmacists as they are the ones that recommended that he switch to Eliquis from Coumadin. The last time I spoke with the patient he asked why he couldn't just stay on Coumadin. I spoke with Jinny Blossom who is one of our pharmacists in the office and she advised that because of the Covid-19 they were trying to prevent patients from having to come out of their homes for their INR checks that are required when a patient is taking coumadin. Let me check on this and Ill let you know. I will include our pharmacist in this message so they can advise prior to me calling the patient if it is ok for him to remain/go back on Coumadin. Thanks Jeani Hawking ----- Message ----- From: Melba Coon Sent: 09/05/2018   4:01 PM EDT To: Deliah [place] Via, LPN Subject: Eliquis                                        Hi Lyn, I spoke with Cherie Dark and instructed him to obtain an out of pocket expense report to submit to [facility] for the Eliquis although the pharmacist called me to share that he only has $106 total expenses since January. He will not meet the out of pocket for some time. The pharmacist stated that it is $135 for a 90 day supply of Eliquis. I will call him and see if he can afford some or all of the cost and left you know. I am know sure who to pass this information on to so if there is someone else could you let them know? Thanks so much! Kennyth Lose

## 2018-09-09 ENCOUNTER — Telehealth: Payer: Self-pay | Admitting: Licensed Clinical Social Worker

## 2018-09-09 MED ORDER — WARFARIN SODIUM 3 MG PO TABS: 3.0000 mg | ORAL_TABLET | Freq: Every day | ORAL | 0 refills | Status: DC

## 2018-09-09 NOTE — Telephone Encounter (Signed)
Spoke with pt - he will take Eliquis for the next 3 days then stop. He will resume warfarin 3mg  tabs taking 2 tablets daily except 1 tablet on Tuesdays and Saturdays. Scheduled follow up INR check for next week - Friday was earliest patient availability.

## 2018-09-09 NOTE — Telephone Encounter (Signed)
CSW returned call to patient to follow up on Eliquis. Message left that office staff will reach out to him to discuss switching back to coumadin as it is less costly for patient. Raquel Sarna, [place], [place]

## 2018-09-09 NOTE — Telephone Encounter (Signed)
LMOM again.

## 2018-09-16 ENCOUNTER — Telehealth: Payer: Self-pay

## 2018-09-16 NOTE — Telephone Encounter (Signed)
lmom for prescreen  

## 2018-09-18 ENCOUNTER — Other Ambulatory Visit: Payer: Self-pay | Admitting: *Deleted

## 2018-09-18 DIAGNOSIS — R911 Solitary pulmonary nodule: Secondary | ICD-10-CM

## 2018-09-18 NOTE — Progress Notes (Unsigned)
Ct

## 2018-09-20 ENCOUNTER — Other Ambulatory Visit: Payer: Self-pay

## 2018-09-20 ENCOUNTER — Encounter (INDEPENDENT_AMBULATORY_CARE_PROVIDER_SITE_OTHER): Payer: Self-pay

## 2018-09-20 ENCOUNTER — Ambulatory Visit (INDEPENDENT_AMBULATORY_CARE_PROVIDER_SITE_OTHER): Payer: Medicare HMO | Admitting: *Deleted

## 2018-09-20 DIAGNOSIS — Z5181 Encounter for therapeutic drug level monitoring: Secondary | ICD-10-CM | POA: Diagnosis not present

## 2018-09-20 DIAGNOSIS — Z7901 Long term (current) use of anticoagulants: Secondary | ICD-10-CM

## 2018-09-20 DIAGNOSIS — I480 Paroxysmal atrial fibrillation: Secondary | ICD-10-CM

## 2018-09-20 LAB — POCT INR: INR: 3.4 — AB (ref 2.0–3.0)

## 2018-09-20 NOTE — Patient Instructions (Addendum)
  Description   Hold tonight's dose, then take 2 tablets daily except for 1 tablet on Monday, Wednesday and Friday. Call us if you have any changes in your medications or upcoming procedures. Coumadin Clinic# [phone], main # [phone].  Pt restarted Coumadin 09/09/2018

## 2018-09-23 ENCOUNTER — Telehealth: Payer: Self-pay

## 2018-09-23 NOTE — Telephone Encounter (Signed)

## 2018-09-23 NOTE — Telephone Encounter (Signed)
lmom for prescreen  

## 2018-09-26 ENCOUNTER — Other Ambulatory Visit: Payer: Self-pay | Admitting: Internal Medicine

## 2018-09-26 DIAGNOSIS — I10 Essential (primary) hypertension: Secondary | ICD-10-CM

## 2018-09-26 NOTE — Telephone Encounter (Signed)
Attempted to contact patient but there was no answer. Left message for patient to call back.  Patient was taking imdur 60 mg QD at last OV 01/15/18. It looks like the medication was removed from the med list on 05/23/18 by Elnita Maxwell, LPN at [facility] stating completed course. Will need to clarify with patient if he has still been taking it and ensure that he is not having any chest pain.

## 2018-09-30 ENCOUNTER — Other Ambulatory Visit: Payer: Medicare HMO

## 2018-09-30 ENCOUNTER — Other Ambulatory Visit: Payer: Self-pay | Admitting: Interventional Cardiology

## 2018-09-30 ENCOUNTER — Encounter (INDEPENDENT_AMBULATORY_CARE_PROVIDER_SITE_OTHER): Payer: Self-pay

## 2018-09-30 ENCOUNTER — Ambulatory Visit (INDEPENDENT_AMBULATORY_CARE_PROVIDER_SITE_OTHER): Payer: Medicare HMO | Admitting: *Deleted

## 2018-09-30 ENCOUNTER — Other Ambulatory Visit: Payer: Self-pay

## 2018-09-30 DIAGNOSIS — E782 Mixed hyperlipidemia: Secondary | ICD-10-CM | POA: Diagnosis not present

## 2018-09-30 DIAGNOSIS — Z7901 Long term (current) use of anticoagulants: Secondary | ICD-10-CM

## 2018-09-30 DIAGNOSIS — I480 Paroxysmal atrial fibrillation: Secondary | ICD-10-CM

## 2018-09-30 DIAGNOSIS — Z5181 Encounter for therapeutic drug level monitoring: Secondary | ICD-10-CM | POA: Diagnosis not present

## 2018-09-30 LAB — HEPATIC FUNCTION PANEL
ALT: 11 IU/L (ref 0–44)
AST: 21 IU/L (ref 0–40)
Albumin: 4.1 g/dL (ref 3.7–4.7)
Alkaline Phosphatase: 143 IU/L — ABNORMAL HIGH (ref 39–117)
Bilirubin Total: 0.3 mg/dL (ref 0.0–1.2)
Bilirubin, Direct: 0.1 mg/dL (ref 0.00–0.40)
Total Protein: 6.8 g/dL (ref 6.0–8.5)

## 2018-09-30 LAB — PROTIME-INR
INR: 6.6 (ref 0.8–1.2)
Prothrombin Time: 62.2 s — ABNORMAL HIGH (ref 9.1–12.0)

## 2018-09-30 LAB — LIPID PANEL
Chol/HDL Ratio: 4.5 ratio (ref 0.0–5.0)
Cholesterol, Total: 147 mg/dL (ref 100–199)
HDL: 33 mg/dL — ABNORMAL LOW (ref >39)
Triglycerides: 573 mg/dL (ref 0–149)

## 2018-09-30 LAB — LDL CHOLESTEROL, DIRECT: LDL Direct: 32 mg/dL (ref 0–99)

## 2018-09-30 LAB — POCT INR: INR: 6.3 — AB (ref 2.0–3.0)

## 2018-09-30 MED ORDER — ISOSORBIDE MONONITRATE ER 60 MG PO TB24: 60.0000 mg | ORAL_TABLET | Freq: Every day | ORAL | 3 refills | Status: AC

## 2018-09-30 NOTE — Addendum Note (Signed)
Addended by: Drue Novel I on: 09/30/2018 05:39 PM   Modules accepted: Orders

## 2018-09-30 NOTE — Telephone Encounter (Signed)
Called and spoke to patient. Patient states that he has been taking imdur 60 mg QD and has 3 pills left. Rx sent in to preferred pharmacy.

## 2018-10-01 ENCOUNTER — Telehealth: Payer: Self-pay | Admitting: Pharmacist

## 2018-10-01 NOTE — Telephone Encounter (Signed)
Called pt to discuss lipid results - TG elevated at 573. Left message with pt to confirm his adherence to atorvastastin 80mg  daily and fenofibrate 145mg  daily - does not look as though our office has sent in fenofibrate rx since 2018. If he has not been taking this, will resume therapy. If he has been adherent to both meds, will plan to start Vascepa 2g BID. Copay with patient's insurance plan is $100/month - will see if pt qualifies for financial assistance through the [employer].

## 2018-10-07 ENCOUNTER — Telehealth: Payer: Self-pay | Admitting: Pharmacist

## 2018-10-07 ENCOUNTER — Ambulatory Visit (INDEPENDENT_AMBULATORY_CARE_PROVIDER_SITE_OTHER): Payer: Medicare Other | Admitting: Pharmacist

## 2018-10-07 ENCOUNTER — Other Ambulatory Visit: Payer: Self-pay

## 2018-10-07 DIAGNOSIS — I480 Paroxysmal atrial fibrillation: Secondary | ICD-10-CM

## 2018-10-07 DIAGNOSIS — Z5181 Encounter for therapeutic drug level monitoring: Secondary | ICD-10-CM

## 2018-10-07 DIAGNOSIS — Z7901 Long term (current) use of anticoagulants: Secondary | ICD-10-CM | POA: Diagnosis not present

## 2018-10-07 DIAGNOSIS — E782 Mixed hyperlipidemia: Secondary | ICD-10-CM

## 2018-10-07 LAB — POCT INR: INR: 3 (ref 2.0–3.0)

## 2018-10-07 MED ORDER — FENOFIBRATE 145 MG PO TABS: 145.0000 mg | ORAL_TABLET | Freq: Every day | ORAL | 11 refills | Status: AC

## 2018-10-07 NOTE — Telephone Encounter (Signed)
Spoke with patient while he was here for INR check. Reviewed his lipid panel. LDL at goal. TG elevated > 500. Patient does not believe he has been taking fenofibrate. Thinks he was told to stop taking. Agreeable to resuming. Sent Rx to [facility]. Patient scheduled for repeat lipid panel and lft in 10 weeks 12/16/2018.

## 2018-10-07 NOTE — Patient Instructions (Signed)
Continue taking 1 tablet daily except for 2 tablets on Sundays, Tuesdays, and Thursdays. Recheck INR in 1.5 weeks. Call us if you have any changes in your medications or upcoming procedures. Coumadin Clinic# [phone], main # [phone].

## 2018-10-09 ENCOUNTER — Telehealth: Payer: Self-pay

## 2018-10-09 NOTE — Telephone Encounter (Signed)
LMOM FOR PRESCREEN

## 2018-10-24 ENCOUNTER — Other Ambulatory Visit: Payer: Self-pay

## 2018-10-24 ENCOUNTER — Emergency Department (HOSPITAL_COMMUNITY)
Admission: EM | Admit: 2018-10-24 | Discharge: 2018-10-24 | Disposition: A | Discharge status: Home / Self Care | Payer: Medicare Other | Attending: Emergency Medicine | Admitting: Emergency Medicine

## 2018-10-24 DIAGNOSIS — Z951 Presence of aortocoronary bypass graft: Secondary | ICD-10-CM | POA: Insufficient documentation

## 2018-10-24 DIAGNOSIS — Z87891 Personal history of nicotine dependence: Secondary | ICD-10-CM | POA: Insufficient documentation

## 2018-10-24 DIAGNOSIS — Z79899 Other long term (current) drug therapy: Secondary | ICD-10-CM | POA: Diagnosis not present

## 2018-10-24 DIAGNOSIS — I129 Hypertensive chronic kidney disease with stage 1 through stage 4 chronic kidney disease, or unspecified chronic kidney disease: Secondary | ICD-10-CM | POA: Insufficient documentation

## 2018-10-24 DIAGNOSIS — I2510 Atherosclerotic heart disease of native coronary artery without angina pectoris: Secondary | ICD-10-CM | POA: Insufficient documentation

## 2018-10-24 DIAGNOSIS — N183 Chronic kidney disease, stage 3 (moderate): Secondary | ICD-10-CM | POA: Diagnosis not present

## 2018-10-24 DIAGNOSIS — H9202 Otalgia, left ear: Secondary | ICD-10-CM

## 2018-10-24 DIAGNOSIS — H7292 Unspecified perforation of tympanic membrane, left ear: Secondary | ICD-10-CM | POA: Diagnosis not present

## 2018-10-24 DIAGNOSIS — Z7901 Long term (current) use of anticoagulants: Secondary | ICD-10-CM | POA: Diagnosis not present

## 2018-10-24 MED ORDER — OFLOXACIN 0.3 % OP SOLN
5.0000 [drp] | Freq: Every day | OPHTHALMIC | Status: DC
  Administered 2018-10-24: 11:00:00 5 [drp] via OTIC
  Filled 2018-10-24: qty 5

## 2018-10-24 NOTE — ED Triage Notes (Addendum)
Pt POV d/t L. Ear pain w/ decrease hearing that started on 10/23/2018. Pt states he stuck a q-tip in ear this AM which intensified pain. Q-tip had blood upon removal

## 2018-10-24 NOTE — Discharge Instructions (Signed)
Please apply 5 drops to your left ear daily.  Please avoid swimming, inserting water or Q-tips to your left ear.  Please schedule an appointment with ENT, number attached to your paperwork.
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
## 2018-10-26 ENCOUNTER — Emergency Department (HOSPITAL_COMMUNITY): Payer: Medicare Other

## 2018-10-26 ENCOUNTER — Other Ambulatory Visit: Payer: Self-pay

## 2018-10-26 ENCOUNTER — Encounter (HOSPITAL_COMMUNITY): Payer: Self-pay | Admitting: Emergency Medicine

## 2018-10-26 ENCOUNTER — Emergency Department (HOSPITAL_COMMUNITY)
Admission: EM | Admit: 2018-10-26 | Discharge: 2018-10-27 | Disposition: A | Discharge status: Home / Self Care | Payer: Medicare Other | Attending: Emergency Medicine | Admitting: Emergency Medicine

## 2018-10-26 DIAGNOSIS — Z79899 Other long term (current) drug therapy: Secondary | ICD-10-CM | POA: Insufficient documentation

## 2018-10-26 DIAGNOSIS — Y929 Unspecified place or not applicable: Secondary | ICD-10-CM | POA: Diagnosis not present

## 2018-10-26 DIAGNOSIS — R791 Abnormal coagulation profile: Secondary | ICD-10-CM | POA: Diagnosis not present

## 2018-10-26 DIAGNOSIS — S8011XA Contusion of right lower leg, initial encounter: Secondary | ICD-10-CM | POA: Diagnosis not present

## 2018-10-26 DIAGNOSIS — I129 Hypertensive chronic kidney disease with stage 1 through stage 4 chronic kidney disease, or unspecified chronic kidney disease: Secondary | ICD-10-CM | POA: Insufficient documentation

## 2018-10-26 DIAGNOSIS — Z87891 Personal history of nicotine dependence: Secondary | ICD-10-CM | POA: Diagnosis not present

## 2018-10-26 DIAGNOSIS — I959 Hypotension, unspecified: Secondary | ICD-10-CM | POA: Diagnosis not present

## 2018-10-26 DIAGNOSIS — N183 Chronic kidney disease, stage 3 (moderate): Secondary | ICD-10-CM | POA: Insufficient documentation

## 2018-10-26 DIAGNOSIS — W19XXXA Unspecified fall, initial encounter: Secondary | ICD-10-CM | POA: Diagnosis not present

## 2018-10-26 DIAGNOSIS — Z7901 Long term (current) use of anticoagulants: Secondary | ICD-10-CM | POA: Diagnosis not present

## 2018-10-26 DIAGNOSIS — I2510 Atherosclerotic heart disease of native coronary artery without angina pectoris: Secondary | ICD-10-CM | POA: Insufficient documentation

## 2018-10-26 DIAGNOSIS — E861 Hypovolemia: Secondary | ICD-10-CM | POA: Diagnosis not present

## 2018-10-26 DIAGNOSIS — Y999 Unspecified external cause status: Secondary | ICD-10-CM | POA: Diagnosis not present

## 2018-10-26 DIAGNOSIS — I9589 Other hypotension: Secondary | ICD-10-CM

## 2018-10-26 DIAGNOSIS — S8991XA Unspecified injury of right lower leg, initial encounter: Secondary | ICD-10-CM | POA: Diagnosis present

## 2018-10-26 DIAGNOSIS — Y939 Activity, unspecified: Secondary | ICD-10-CM | POA: Insufficient documentation

## 2018-10-26 LAB — PROTIME-INR
INR: 9.4 (ref 0.8–1.2)
Prothrombin Time: 74.5 seconds — ABNORMAL HIGH (ref 11.4–15.2)
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
## 2018-10-26 NOTE — ED Notes (Signed)
Called xray to let them know pt needed to be scanned in room due to bed bugs.

## 2018-10-26 NOTE — Discharge Instructions (Addendum)
Please hold your dose of coumadin for today. Tomorrow (Monday) call your Coumadin clinic and ask if they would like you to hold your dose for Monday and/or Tuesday. Follow up with them as scheduled for Wednesday.

## 2018-10-26 NOTE — ED Triage Notes (Signed)
Pt here for eval of right knee/calf/ and lower extremity bruising and swelling after a fall "yesterday." bruising is varied from yellow, purple to red. Pt has hx of open heart surgery with surgical scar noted to area of the injury. States he tripped when getting out of the truck. No syncopal events.

## 2018-10-27 LAB — CBC WITH DIFFERENTIAL/PLATELET
Abs Immature Granulocytes: 0.01 10*3/uL (ref 0.00–0.07)
Basophils Absolute: 0 10*3/uL (ref 0.0–0.1)
Basophils Relative: 1 %
Eosinophils Absolute: 0.1 10*3/uL (ref 0.0–0.5)
Eosinophils Relative: 1 %
HCT: 35.3 % — ABNORMAL LOW (ref 39.0–52.0)
Hemoglobin: 11.4 g/dL — ABNORMAL LOW (ref 13.0–17.0)
Immature Granulocytes: 0 %
Lymphocytes Relative: 22 %
Lymphs Abs: 1.4 10*3/uL (ref 0.7–4.0)
MCH: 29.3 pg (ref 26.0–34.0)
MCHC: 32.3 g/dL (ref 30.0–36.0)
MCV: 90.7 fL (ref 80.0–100.0)
Monocytes Absolute: 0.7 10*3/uL (ref 0.1–1.0)
Monocytes Relative: 11 %
Neutro Abs: 4.2 10*3/uL (ref 1.7–7.7)
Neutrophils Relative %: 65 %
Platelets: 222 10*3/uL (ref 150–400)
RBC: 3.89 MIL/uL — ABNORMAL LOW (ref 4.22–5.81)
RDW: 14.4 % (ref 11.5–15.5)
WBC: 6.4 10*3/uL (ref 4.0–10.5)
nRBC: 0 % (ref 0.0–0.2)

## 2018-10-27 LAB — BASIC METABOLIC PANEL
Anion gap: 11 (ref 5–15)
BUN: 23 mg/dL (ref 8–23)
CO2: 20 mmol/L — ABNORMAL LOW (ref 22–32)
Calcium: 8.9 mg/dL (ref 8.9–10.3)
Chloride: 105 mmol/L (ref 98–111)
Creatinine, Ser: 2.21 mg/dL — ABNORMAL HIGH (ref 0.61–1.24)
GFR calc Af Amer: 33 mL/min — ABNORMAL LOW (ref >60)
GFR calc non Af Amer: 28 mL/min — ABNORMAL LOW (ref >60)
Glucose, Bld: 156 mg/dL — ABNORMAL HIGH (ref 70–99)
Potassium: 3.9 mmol/L (ref 3.5–5.1)
Sodium: 136 mmol/L (ref 135–145)

## 2018-10-27 MED ORDER — SODIUM CHLORIDE 0.9 % IV BOLUS (SEPSIS)
1000.0000 mL | Freq: Once | INTRAVENOUS | Status: AC
  Administered 2018-10-27: 1000 mL via INTRAVENOUS

## 2018-10-27 MED ORDER — SODIUM CHLORIDE 0.9 % IV SOLN
1000.0000 mL | INTRAVENOUS | Status: DC
  Administered 2018-10-27: 05:00:00 1000 mL via INTRAVENOUS

## 2018-10-27 MED ORDER — SODIUM CHLORIDE 0.9 % IV BOLUS
1000.0000 mL | Freq: Once | INTRAVENOUS | Status: AC
  Administered 2018-10-27: 1000 mL via INTRAVENOUS

## 2018-10-27 MED ORDER — ACETAMINOPHEN 500 MG PO TABS
1000.0000 mg | ORAL_TABLET | Freq: Once | ORAL | Status: AC
  Administered 2018-10-27: 1000 mg via ORAL
  Filled 2018-10-27: qty 2

## 2018-10-27 NOTE — ED Notes (Signed)
EDP at bedside  

## 2018-10-27 NOTE — ED Provider Notes (Signed)
I assumed care of this patient from Dr. Wilson Singer at 2330.  Please see their note for further details of Hx, PE.  Briefly patient is a 73 y.o. male who presented with right leg hematoma s/p fall while on coumadin. No bony injuries noted. Pending INR.   INR 9.4. on reassessment, patient was hypotensive with SBP from 70-90s on numerous checks. Provided with IVF and screening labs obtained.  Hb with one gram drop, but given the fact that this injury occurred more than 24 hrs ago, this is likely not to drop further as hematoma is stable and not expanding.  After 1L, patient still hypotensive. Given 2nd L, which improved BP to 110-120s.  Ambulated without complication. Instructed to hold next dose and call coumadin clinic tomorrow for further recs regarding dosing. Already scheduled to see them in 3 days.  .Critical Care Performed by: Fatima Blank, MD Authorized by: Fatima Blank, MD      CRITICAL CARE Performed by: Grayce Sessions  Total critical care time: 50 minutes Critical care time was exclusive of separately billable procedures and treating other patients. Critical care was necessary to treat or prevent imminent or life-threatening deterioration. Critical care was time spent personally by me on the following activities: development of treatment plan with patient and/or surrogate as well as nursing, discussions with consultants, evaluation of patient's response to treatment, examination of patient, obtaining history from patient or surrogate, ordering and performing treatments and interventions, ordering and review of laboratory studies, ordering and review of radiographic studies, pulse oximetry and re-evaluation of patient's condition.    The patient appears reasonably screened and/or stabilized for discharge and I doubt any other medical condition or other [facility] requiring further screening, evaluation, or treatment in the ED at this time prior to  discharge.  Disposition: Discharge  Condition: Good  I have discussed the results, Dx and Tx plan with the patient who expressed understanding and agree(s) with the plan. Discharge instructions discussed at great length. The patient was given strict return precautions who verbalized understanding of the instructions. No further questions at time of discharge.    ED Discharge Orders    None        Follow Up: Aldine Contes, MD [address], [address]  [place] [address] [phone]         Fatima Blank, MD 10/27/18 1750

## 2018-10-27 NOTE — ED Notes (Signed)
Pt verbalizes understanding of discharge instructions. Opportunities for questions provided. Pt discharged from ED

## 2018-10-27 NOTE — ED Notes (Signed)
Pt HR noted to have decreased from 100s to 30s to 60s. Pt placed on cardiac monitor. HX PAF.  Dr. Leonette Monarch Informed.

## 2018-10-27 NOTE — ED Notes (Signed)
Meal and drink given to pt. Plan of care discussed with pt. BP to cycle q69min. RN to monitor BP

## 2018-11-01 ENCOUNTER — Telehealth: Payer: Self-pay

## 2018-11-01 ENCOUNTER — Telehealth: Payer: Self-pay | Admitting: Interventional Cardiology

## 2018-11-01 NOTE — Telephone Encounter (Signed)

## 2018-11-01 NOTE — Telephone Encounter (Signed)
LMOM FOR PRESCREEN

## 2018-11-05 ENCOUNTER — Ambulatory Visit: Payer: Medicare Other | Admitting: Internal Medicine

## 2018-11-06 ENCOUNTER — Ambulatory Visit (INDEPENDENT_AMBULATORY_CARE_PROVIDER_SITE_OTHER): Payer: Medicare Other | Admitting: *Deleted

## 2018-11-06 ENCOUNTER — Encounter (INDEPENDENT_AMBULATORY_CARE_PROVIDER_SITE_OTHER): Payer: Self-pay

## 2018-11-06 ENCOUNTER — Ambulatory Visit: Payer: Medicare Other | Admitting: Surgery

## 2018-11-06 ENCOUNTER — Other Ambulatory Visit: Payer: Self-pay

## 2018-11-06 ENCOUNTER — Ambulatory Visit: Admission: RE | Admit: 2018-11-06 | Payer: Medicare HMO | Source: Ambulatory Visit

## 2018-11-06 DIAGNOSIS — I480 Paroxysmal atrial fibrillation: Secondary | ICD-10-CM

## 2018-11-06 DIAGNOSIS — Z7901 Long term (current) use of anticoagulants: Secondary | ICD-10-CM

## 2018-11-06 DIAGNOSIS — Z5181 Encounter for therapeutic drug level monitoring: Secondary | ICD-10-CM | POA: Diagnosis not present

## 2018-11-06 LAB — PROTIME-INR
INR: 6.2 (ref 0.8–1.2)
Prothrombin Time: 65.9 s — ABNORMAL HIGH (ref 9.1–12.0)

## 2018-11-06 LAB — POCT INR: INR: 6.7 — AB (ref 2.0–3.0)

## 2018-11-06 NOTE — Patient Instructions (Addendum)
Description   Called pt and instructed pt to hold Coumadin today, hold Thursday, and hold Friday's dose then take 1 tablet Saturday and 1 tablet Sunday then recheck INR on Monday.  Normal dose: 1 tablet daily except 2 tablets on Sundays, Tuesdays, and Thursdays. Call Coumadin clinic with questions [phone].

## 2018-11-11 ENCOUNTER — Ambulatory Visit (INDEPENDENT_AMBULATORY_CARE_PROVIDER_SITE_OTHER): Payer: Medicare Other

## 2018-11-11 ENCOUNTER — Other Ambulatory Visit: Payer: Self-pay

## 2018-11-11 DIAGNOSIS — Z7901 Long term (current) use of anticoagulants: Secondary | ICD-10-CM

## 2018-11-11 DIAGNOSIS — I480 Paroxysmal atrial fibrillation: Secondary | ICD-10-CM | POA: Diagnosis not present

## 2018-11-11 DIAGNOSIS — Z5181 Encounter for therapeutic drug level monitoring: Secondary | ICD-10-CM | POA: Diagnosis not present

## 2018-11-11 LAB — POCT INR: INR: 2.9 (ref 2.0–3.0)

## 2018-11-11 NOTE — Patient Instructions (Signed)
Description   Start taking 1 tablet daily except 2 tablets on Sundays and Thursdays. Recheck in 10 days.  Call Coumadin clinic with questions [phone].

## 2018-11-12 ENCOUNTER — Other Ambulatory Visit: Payer: Self-pay | Admitting: Interventional Cardiology

## 2018-11-21 ENCOUNTER — Other Ambulatory Visit: Payer: Self-pay

## 2018-11-21 ENCOUNTER — Ambulatory Visit (INDEPENDENT_AMBULATORY_CARE_PROVIDER_SITE_OTHER): Payer: Medicare Other | Admitting: *Deleted

## 2018-11-21 DIAGNOSIS — I480 Paroxysmal atrial fibrillation: Secondary | ICD-10-CM | POA: Diagnosis not present

## 2018-11-21 DIAGNOSIS — Z5181 Encounter for therapeutic drug level monitoring: Secondary | ICD-10-CM

## 2018-11-21 LAB — POCT INR: INR: 3 (ref 2.0–3.0)

## 2018-11-21 NOTE — Patient Instructions (Signed)
Description   Continue taking 1 tablet daily except 2 tablets on Sundays and Thursdays. Recheck in 2 weeks. Call Coumadin clinic with questions [phone].

## 2018-11-26 ENCOUNTER — Ambulatory Visit (INDEPENDENT_AMBULATORY_CARE_PROVIDER_SITE_OTHER): Payer: Medicare Other | Admitting: Internal Medicine

## 2018-11-26 ENCOUNTER — Other Ambulatory Visit: Payer: Self-pay

## 2018-11-26 ENCOUNTER — Encounter: Payer: Self-pay | Admitting: Internal Medicine

## 2018-11-26 VITALS — BP 142/73 | HR 54 | Temp 97.3°F | Ht 71.0 in | Wt 218.3 lb

## 2018-11-26 DIAGNOSIS — E785 Hyperlipidemia, unspecified: Secondary | ICD-10-CM

## 2018-11-26 DIAGNOSIS — M19049 Primary osteoarthritis, unspecified hand: Secondary | ICD-10-CM

## 2018-11-26 DIAGNOSIS — M79644 Pain in right finger(s): Secondary | ICD-10-CM

## 2018-11-26 DIAGNOSIS — M79645 Pain in left finger(s): Secondary | ICD-10-CM

## 2018-11-26 DIAGNOSIS — N183 Chronic kidney disease, stage 3 unspecified: Secondary | ICD-10-CM

## 2018-11-26 DIAGNOSIS — I25119 Atherosclerotic heart disease of native coronary artery with unspecified angina pectoris: Secondary | ICD-10-CM

## 2018-11-26 DIAGNOSIS — I712 Thoracic aortic aneurysm, without rupture, unspecified: Secondary | ICD-10-CM

## 2018-11-26 DIAGNOSIS — I10 Essential (primary) hypertension: Secondary | ICD-10-CM

## 2018-11-26 DIAGNOSIS — Z0000 Encounter for general adult medical examination without abnormal findings: Secondary | ICD-10-CM

## 2018-11-26 DIAGNOSIS — R911 Solitary pulmonary nodule: Secondary | ICD-10-CM | POA: Diagnosis not present

## 2018-11-26 DIAGNOSIS — I2510 Atherosclerotic heart disease of native coronary artery without angina pectoris: Secondary | ICD-10-CM

## 2018-11-26 DIAGNOSIS — E782 Mixed hyperlipidemia: Secondary | ICD-10-CM

## 2018-11-26 DIAGNOSIS — E538 Deficiency of other specified B group vitamins: Secondary | ICD-10-CM

## 2018-11-26 DIAGNOSIS — I129 Hypertensive chronic kidney disease with stage 1 through stage 4 chronic kidney disease, or unspecified chronic kidney disease: Secondary | ICD-10-CM | POA: Diagnosis not present

## 2018-11-26 DIAGNOSIS — Z951 Presence of aortocoronary bypass graft: Secondary | ICD-10-CM

## 2018-11-26 DIAGNOSIS — R7303 Prediabetes: Secondary | ICD-10-CM

## 2018-11-26 DIAGNOSIS — E875 Hyperkalemia: Secondary | ICD-10-CM

## 2018-11-26 DIAGNOSIS — I714 Abdominal aortic aneurysm, without rupture, unspecified: Secondary | ICD-10-CM

## 2018-11-26 DIAGNOSIS — Z79899 Other long term (current) drug therapy: Secondary | ICD-10-CM

## 2018-11-26 DIAGNOSIS — I6523 Occlusion and stenosis of bilateral carotid arteries: Secondary | ICD-10-CM

## 2018-11-26 DIAGNOSIS — E781 Pure hyperglyceridemia: Secondary | ICD-10-CM

## 2018-11-26 LAB — POCT GLYCOSYLATED HEMOGLOBIN (HGB A1C): Hemoglobin A1C: 5.5 % (ref 4.0–5.6)

## 2018-11-26 LAB — GLUCOSE, CAPILLARY: Glucose-Capillary: 98 mg/dL (ref 70–99)

## 2018-11-26 MED ORDER — DICLOFENAC SODIUM 1 % TD GEL: 4.0000 g | Freq: Four times a day (QID) | TRANSDERMAL | 1 refills | Status: AC

## 2018-11-26 NOTE — Assessment & Plan Note (Signed)
-  This problem is chronic and stable -Patient has a history of CAD status post CABG and follows up with cardiology for this -We will continue with Toprol XL and Imdur per cardiology -Patient also noted to have uncontrolled hypertriglyceridemia on his last lipid panel with cardiology.  It was unclear whether he was taking his fenofibrate or not.  This is since been restarted for him and he is taking his fenofibrate as well has his atorvastatin 80 mg -No further work-up at this time -Patient will continue to follow-up with cardiology

## 2018-11-26 NOTE — Assessment & Plan Note (Addendum)
-  This problem is chronic -Patient noted to have worsening creatinine on lab work up to July (creatinine of 2.2) -Will check a repeat BMP today -If creatinine remains high we will consider referral to nephrology for further evaluation -No further work-up at this time  Addendum: - Patient noted to have mildly improved creatinine (2.09) from prior but still elevated from baseline (1.4-1.6). He is also noted to be mildly hyperkalemic at 5.4. We will hold his lisinopril and have him follow up next week in [facility]. He will likely need referral to nephrology. Attempted multiple times to call patient but no answer. I have asked triage to attempt to call him as well.

## 2018-11-26 NOTE — Assessment & Plan Note (Addendum)
-  This problem is chronic and stable -Patient states that he is compliant with his vitamin B12 at home -We will check a vitamin B12 level today -No further work-up for now  Addendum: - Patient's B12 level has improved to low normal. I would consider checking MMA at follow up visit and giving patient an IM B 12 injection in the clinic.

## 2018-11-26 NOTE — Assessment & Plan Note (Signed)
-  Patient was noted to have a new 2.4 cm lobular mass on the CT of his chest which is done to evaluate his a sending aortic aneurysm -He had a PET scan done which showed increased uptake in the right apical lung nodule concerning for primary bronchogenic carcinoma -He was scheduled to have a biopsy done of this nodule on March 17 but did not go for the test -He was also scheduled for repeat CT chest this month but did not follow-up with this nor did he follow-up with cardiothoracic surgery -I explained to the patient the importance and urgency of following up with the repeat CT chest as well as the biopsy as the lung nodule is concerning for lung cancer. -Patient expresses understanding.  I given him the number for the cardiothoracic surgeon and he will contact them for further follow-up -I will also asked the patient follow-up with me in 4 weeks to make sure that he has set up these appointments and is following up for this

## 2018-11-26 NOTE — Assessment & Plan Note (Signed)
-  This problem is chronic and stable -Patient needs a repeat carotid artery duplex this year.  His carotid duplex done last year showed unchanged carotid disease (right ICA 40 to 59% occlusion, left ICA old occlusion) -Patient has yet to follow-up with vascular surgery this year and we had given him the number to call to make a follow-up appointment -Continue with statin and fenofibrate -No further work-up at this time

## 2018-11-26 NOTE — Assessment & Plan Note (Signed)
-  This problem is chronic and stable -We will continue with atorvastatin and fenofibrate for this.  Patient also states that he is taking fish oil at home -We will follow-up lipid panel on his next visit -No further work-up at this time

## 2018-11-26 NOTE — Assessment & Plan Note (Signed)
-  Patient complains of intermittent pain in his DIP and PIP joints as well as intermittent pain in his knee joints -He has been taking Tylenol over-the-counter for this and has only minimal relief from this -I suspect that he has osteoarthritis.  On exam there is no swelling of the joints no obvious joint deformities except in his left middle finger DIP which patient states is from an old injury -We will prescribe Voltaren gel for now to see if this will help him with his pain -We will continue to monitor closely

## 2018-11-26 NOTE — Assessment & Plan Note (Signed)
-  Patient to be given FIT test from lab today for colonoscopy screening.  I do not think he is a candidate for a colonoscopy at current time given his multiple medical issues and comorbidities that need to be addressed at this time -Patient also requires a flu shot and will follow-up with his pharmacy for this as we do not have the flu shot yet in [facility]

## 2018-11-26 NOTE — Assessment & Plan Note (Signed)
-  This problem is chronic and stable -Patient has not followed up with vascular surgery this year and needs repeat imaging of his AAA -Encourage patient to call Dr. Claretha Cooper office for follow-up and repeat imaging -Patient states that he will do this.  Number given to the patient to call for follow-up appointment with vascular surgery -No further work-up at this time

## 2018-11-26 NOTE — Progress Notes (Signed)
   Subjective:    Patient ID: Dylan Morgan, male    DOB: 06-27-1945, 73 y.o.   MRN: [id number]  HPI  I have seen and examined this patient.  Patient is here for routine follow-up of his hypertension and CAD.  Patient states that he is compliant with all his medications.  He does complain of pain in his fingers and states that Tylenol has not been helping as much.  Patient is scheduled to have a biopsy of a lung nodule suspicious for bronchogenic carcinoma back in March but missed this appointment.  Patient also missed a follow-up CT and appointment with Dr. Mohammed Kindle (cardiothoracic surgery) this month.  He denies any other complaints at this time.   Review of Systems  Constitutional: Negative.   HENT: Negative.   Respiratory: Negative.   Cardiovascular: Negative.   Gastrointestinal: Negative.   Musculoskeletal: Positive for arthralgias. Negative for gait problem, joint swelling and myalgias.       Patient complains of intermittent pain in his fingers but denies any swelling or redness.  Patient states that the pain is in his joints and that he occasionally has pain in his knee joint as well  Neurological: Negative.   Psychiatric/Behavioral: Negative.        Objective:   Physical Exam Constitutional:      Appearance: Normal appearance.  HENT:     Head: Normocephalic and atraumatic.  Neck:     Musculoskeletal: Neck supple.     Comments: No palpable cervical or supraclavicular lymph nodes Cardiovascular:     Rate and Rhythm: Normal rate and regular rhythm.     Heart sounds: Normal heart sounds.  Pulmonary:     Effort: Pulmonary effort is normal.     Breath sounds: Normal breath sounds. No wheezing or rales.  Abdominal:     General: Bowel sounds are normal.     Palpations: Abdomen is soft.     Tenderness: There is no abdominal tenderness. There is no guarding.  Musculoskeletal:        General: No swelling or tenderness.     Comments: Patient without any swelling in his fingers  and no tenderness to palpation but does complain of pain in the joints intermittently.  He does have a deformity in his left middle finger at the distal IP joint but states that this is from a prior injury  Lymphadenopathy:     Cervical: No cervical adenopathy.  Neurological:     General: No focal deficit present.     Mental Status: He is alert and oriented to person, place, and time.  Psychiatric:        Mood and Affect: Mood normal.        Behavior: Behavior normal.           Assessment & Plan:  Please see problem based charting for assessment and plan:

## 2018-11-26 NOTE — Assessment & Plan Note (Signed)
-  This problem is chronic and stable -Patient follow-up with cardiothoracic surgery in January of this year and had a stable 4.6 cm ascending aortic aneurysm -He has missed subsequent follow-up appointments with cardiothoracic surgery and missed a repeat CT chest scheduled for this month -We have given him the number for the cardiothoracic surgeon and explained to him the importance of following up with him for this.  He will call their office for an appointment -No further work-up at this time

## 2018-11-26 NOTE — Assessment & Plan Note (Signed)
BP Readings from Last 3 Encounters:  11/26/18 (!) 142/73  10/27/18 128/76  10/24/18 101/65    Lab Results  Component Value Date   NA 136 10/27/2018   K 3.9 10/27/2018   CREATININE 2.21 (H) 10/27/2018    Assessment: Blood pressure control:  Fair Progress toward BP goal:   Deteriorated Comments: Patient states that he is compliant with lisinopril 5 mg, Toprol-XL 25 mg as well as Imdur 60 mg daily.  Plan: Medications:  continue current medications Educational resources provided:   Self management tools provided:   Other plans: We will check BMP today.  If patient blood pressure remains elevated may consider increasing his lisinopril if his creatinine is at baseline

## 2018-11-26 NOTE — Patient Instructions (Addendum)
-  It was a pleasure seeing you today -It is extremely important that you follow-up with the cardiothoracic surgeon for the lung nodule as this is concerning for cancer.  You would likely need a biopsy of this.  This biopsy was previously scheduled for you in March.  He would likely need another scan and then biopsy of the lesion -Please follow-up with your vascular surgeon for follow-up of your abdominal aortic aneurysm -We will check some blood work today including your kidney function and vitamin B12 levels -We will also give you a stool card for screening for colon cancer -I have prescribed Voltaren gel for you to use on your fingers as needed for pain.  I suspect that this pain is likely secondary to osteoarthritis -Please call me if you have any questions or concerns

## 2018-11-26 NOTE — Assessment & Plan Note (Signed)
-  This problem is chronic and stable -We will check A1c today -Patient denies any polyuria or polydipsia -No further work-up at this time

## 2018-11-27 LAB — BMP8+ANION GAP
Anion Gap: 21 mmol/L — ABNORMAL HIGH (ref 10.0–18.0)
BUN/Creatinine Ratio: 17 (ref 10–24)
BUN: 35 mg/dL — ABNORMAL HIGH (ref 8–27)
CO2: 19 mmol/L — ABNORMAL LOW (ref 20–29)
Calcium: 10.4 mg/dL — ABNORMAL HIGH (ref 8.6–10.2)
Chloride: 105 mmol/L (ref 96–106)
Creatinine, Ser: 2.09 mg/dL — ABNORMAL HIGH (ref 0.76–1.27)
GFR calc Af Amer: 35 mL/min/{1.73_m2} — ABNORMAL LOW (ref >59)
GFR calc non Af Amer: 30 mL/min/{1.73_m2} — ABNORMAL LOW (ref >59)
Glucose: 103 mg/dL — ABNORMAL HIGH (ref 65–99)
Potassium: 5.4 mmol/L — ABNORMAL HIGH (ref 3.5–5.2)
Sodium: 145 mmol/L — ABNORMAL HIGH (ref 134–144)

## 2018-11-27 LAB — VITAMIN B12: Vitamin B-12: 243 pg/mL (ref 232–1245)

## 2018-11-28 ENCOUNTER — Telehealth: Payer: Self-pay | Admitting: Internal Medicine

## 2018-11-28 ENCOUNTER — Other Ambulatory Visit: Payer: Self-pay | Admitting: *Deleted

## 2018-11-28 DIAGNOSIS — R911 Solitary pulmonary nodule: Secondary | ICD-10-CM

## 2018-11-28 NOTE — Progress Notes (Unsigned)
CT

## 2018-11-28 NOTE — Telephone Encounter (Signed)
I called the patient to discuss the results of his blood work with him.  Patient was noted to have an elevated creatinine of 2.09 (mildly improved from July but overall worsened from his previous creatinines).  His potassium was also noted to be elevated at 5.4.  He was also noted to be mildly hypercalcemic with a calcium of 10.4.  I explained the patient that his overall renal function appears to be deteriorating and that he may need referral to nephrology.  I instructed him to hold his lisinopril and follow-up in [facility] next week for repeat blood work as well as possible referral to nephrology.  Patient also noted to have borderline B12 levels (improved from prior).  I would consider checking an MMA on his follow-up visit as well.  If this is elevated would consider a B12 injection.  Patient also states that he will call the cardiothoracic surgeon today for follow-up with him regarding pulmonary nodule as well as ascending aortic aneurysm.  Patient has missed his appointments for a biopsy of the pulmonary nodule (likely bronchogenic carcinoma) as well as follow-up CT for evaluation of his ascending aortic aneurysm.  I again explained to him the importance of following up with cardiothoracic surgery for this.  I also encouraged the patient to follow-up with vascular surgery for his abdominal aortic aneurysm as well as his carotid artery disease.  Patient states that he will make appointments with these physicians today or tomorrow.  No further work-up at this time.  Patient expresses understanding and is in agreement plan.

## 2018-12-04 ENCOUNTER — Telehealth (HOSPITAL_COMMUNITY): Payer: Self-pay

## 2018-12-04 DIAGNOSIS — I10 Essential (primary) hypertension: Secondary | ICD-10-CM

## 2018-12-05 ENCOUNTER — Ambulatory Visit (INDEPENDENT_AMBULATORY_CARE_PROVIDER_SITE_OTHER): Payer: Medicare Other | Admitting: *Deleted

## 2018-12-05 ENCOUNTER — Ambulatory Visit (INDEPENDENT_AMBULATORY_CARE_PROVIDER_SITE_OTHER): Payer: Medicare Other | Admitting: Internal Medicine

## 2018-12-05 ENCOUNTER — Encounter: Payer: Self-pay | Admitting: Internal Medicine

## 2018-12-05 ENCOUNTER — Other Ambulatory Visit: Payer: Self-pay

## 2018-12-05 ENCOUNTER — Ambulatory Visit: Payer: Medicare Other

## 2018-12-05 VITALS — BP 139/78 | HR 60 | Temp 97.9°F | Ht 71.0 in | Wt 217.9 lb

## 2018-12-05 DIAGNOSIS — Z8673 Personal history of transient ischemic attack (TIA), and cerebral infarction without residual deficits: Secondary | ICD-10-CM

## 2018-12-05 DIAGNOSIS — M159 Polyosteoarthritis, unspecified: Secondary | ICD-10-CM | POA: Diagnosis not present

## 2018-12-05 DIAGNOSIS — Z5181 Encounter for therapeutic drug level monitoring: Secondary | ICD-10-CM

## 2018-12-05 DIAGNOSIS — I480 Paroxysmal atrial fibrillation: Secondary | ICD-10-CM | POA: Diagnosis not present

## 2018-12-05 DIAGNOSIS — I2510 Atherosclerotic heart disease of native coronary artery without angina pectoris: Secondary | ICD-10-CM

## 2018-12-05 DIAGNOSIS — M19049 Primary osteoarthritis, unspecified hand: Secondary | ICD-10-CM

## 2018-12-05 DIAGNOSIS — Z23 Encounter for immunization: Secondary | ICD-10-CM | POA: Diagnosis not present

## 2018-12-05 DIAGNOSIS — N183 Chronic kidney disease, stage 3 (moderate): Secondary | ICD-10-CM | POA: Diagnosis not present

## 2018-12-05 DIAGNOSIS — I714 Abdominal aortic aneurysm, without rupture: Secondary | ICD-10-CM

## 2018-12-05 DIAGNOSIS — E538 Deficiency of other specified B group vitamins: Secondary | ICD-10-CM

## 2018-12-05 DIAGNOSIS — I129 Hypertensive chronic kidney disease with stage 1 through stage 4 chronic kidney disease, or unspecified chronic kidney disease: Secondary | ICD-10-CM | POA: Diagnosis not present

## 2018-12-05 DIAGNOSIS — G4733 Obstructive sleep apnea (adult) (pediatric): Secondary | ICD-10-CM

## 2018-12-05 LAB — POCT INR: INR: 4.9 — AB (ref 2.0–3.0)

## 2018-12-05 MED ORDER — CYANOCOBALAMIN 1000 MCG/ML IJ SOLN
1000.0000 ug | Freq: Once | INTRAMUSCULAR | Status: AC
  Administered 2018-12-05: 1000 ug via INTRAMUSCULAR

## 2018-12-05 NOTE — Progress Notes (Signed)
   CC: Polyarthralgia  HPI:  Mr.Gen L Liwanag is a 73 y.o. with ckd3, osa, aaa, cad, htn, hx of tia who presents for joint pain. Please see problem based charting for evaluation, assessment, and plan.  Past Medical History:  Diagnosis Date  . AAA (abdominal aortic aneurysm) ([place])    a. 2010 s/p stenting  . Anemia   . Arthritis    "pretty much all over" (09/06/2016)  . Blind left eye    "explosion knocked hole in retina" (09/06/2016)  . Bradycardia   . CAD (coronary artery disease) CABG in 2004   a. 2004 s/p CABG. b. NSTEMI 09/2016 -> cath with occ VG-distal Cx, managed medically.  . Carotid artery disease ([place])    a. 2012 dopplers with old LICA occlusion , RICA no significant abnormality  . Chronic lower back pain   . CKD (chronic kidney disease), stage III ([place])   . CVA (cerebral vascular accident) [facility])    a. 2013 R Carona radiata stroke   . GERD (gastroesophageal reflux disease)   . History of blood transfusion 2004   "related to OHS"  . Hyperlipidemia   . Hypertension   . Paroxysmal atrial fibrillation ([place]) 02/22/2009   a. on Coumadin   . Pre-diabetes   . PVD (peripheral vascular disease) ([place])    a. ?R subclavian stenosis by CT 09/2016.  Marland Kitchen Seborrheic dermatitis of scalp   . Thrombocytopenia ([place])   . Tobacco abuse    Review of Systems:    Review of Systems  Constitutional: Negative for chills and fever.  Respiratory: Negative for shortness of breath.   Cardiovascular: Negative for chest pain.  Gastrointestinal: Negative for abdominal pain, nausea and vomiting.  Genitourinary: Negative for dysuria.  Neurological: Negative for dizziness and headaches.   Physical Exam:  Vitals:   12/05/18 1318  BP: 139/78  Pulse: 60  Temp: 97.9 F (36.6 C)  TempSrc: Oral  SpO2: 100%  Weight: 217 lb 14.4 oz (98.8 kg)  Height: 5\' 11"  (1.803 m)   Physical Exam  Constitutional: He is oriented to person, place, and time. He appears well-developed and well-nourished. No  distress.  HENT:  Head: Normocephalic and atraumatic.  Eyes: Conjunctivae are normal.  Cardiovascular: Normal rate, regular rhythm and normal heart sounds.  Respiratory: Effort normal and breath sounds normal. No respiratory distress. He has no wheezes.  GI: Soft. Bowel sounds are normal. He exhibits no distension. There is no abdominal tenderness.  Musculoskeletal:        General: No edema.     Comments: Boggy pip joints. Tenderness in pip, bilateral ankles, and bilateral shoulders. 5/5 muscle strength in bilateral upper and lower extremities  Neurological: He is alert and oriented to person, place, and time.  Psychiatric: He has a normal mood and affect. His behavior is normal. Judgment and thought content normal.   Assessment & Plan:   See Encounters Tab for problem based charting.  Patient discussed with Dr. Philipp Ovens

## 2018-12-05 NOTE — Patient Instructions (Signed)
It was a pleasure to see you today Dylan Morgan. Please make the following changes:  I am sorry to hear about your joint pain. I recommend you please take ibuprofen 600mg  every 6hrs as needed for upto two weeks. If you are needing it beyond two weeks, please return to clinic. I have given you a flu shot and a vitamin b12 shot during this visit.  If you have any questions or concerns, please call our clinic at [phone] between 9am-5pm and after hours call [phone] and ask for the internal medicine resident on call. If you feel you are having a medical emergency please call 911.   Thank you, we look forward to help you remain healthy!  Lars Mage, MD Internal Medicine PGY3

## 2018-12-05 NOTE — Patient Instructions (Addendum)
Description    Hold today and tomorrow, then continue taking 1 tablet daily except 2 tablets on Sundays and  Thursdays. Be consistent with your green leafy vegetables. Recheck INR 1 week. Call Coumadin clinic with questions [phone].

## 2018-12-05 NOTE — Assessment & Plan Note (Signed)
The patient states that he has been having polyarthralgia for the past few months. He states the pain is present in dip, pip, neck, lower back, bilateral knees and bilateral ankles. No morning stiffness. PHQ9 6. Patient has used tylenol twice daily for the pain and it has helped somewhat, however it does not help as much as it used to. He states that voltaren gel has not been helpful.   He has accompanied numbness and tingling in his ankles and fingers.   Assessment and plan  Patient's signs and symptoms seem to be consistent with osteoarthritis. Patient does not seem to have depression symptoms, but cbt maybe considered in the future for assistance with pain control.   -recommended the patient lose weight -ibuprofen 600mg  q6hrs prn for pain -return to clinic if symptoms persist

## 2018-12-05 NOTE — Assessment & Plan Note (Signed)
Patient states that he has not been taking vitamin b12 1000u daily. Will give patient IM b12 shot today and recommend him continue taking tablets. Will check MMA to assess for pernicious anemia.

## 2018-12-10 ENCOUNTER — Encounter: Payer: Self-pay | Admitting: *Deleted

## 2018-12-10 NOTE — Progress Notes (Signed)
IFOBT kit given to Dylan Morgan 11/26/2018 Post Mark 11/28/2018 Received IFOBT by mail 12/10/2018. Unable to complete analysis due to age of specimen.  A new test kit has been mailed to Dylan Morgan 12/10/2018.  Instructed Dylan Morgan to complete the test kit the day before or the morning of his Oct 6 appt with Dr Dareen Piano.  We asked him to bring the specimen with him to his office visit.  Maryan Rued, PBT 12/10/2018

## 2018-12-11 NOTE — Progress Notes (Signed)
Internal Medicine Clinic Attending  Case discussed with Dr. Chundi at the time of the visit.  We reviewed the resident's history and exam and pertinent patient test results.  I agree with the assessment, diagnosis, and plan of care documented in the resident's note. 

## 2018-12-11 NOTE — Telephone Encounter (Signed)
Called patient  To request him to come for a lab only visit for collection of bmp as his lisinopril was recently held.   Patient was not able to be reached, left message.  Lars Mage, MD Internal Medicine PGY3 SHUOH:[phone] 12/11/2018, 8:54 AM

## 2018-12-14 LAB — METHYLMALONIC ACID(MMA), RND URINE
Creatinine(Crt), U: 1.62 g/L (ref 0.30–3.00)
MMA - Normalized: 0.3 umol/mmol cr (ref 0.3–2.8)
Methylmalonic Acid, Ur: 4.3 umol/L (ref 1.6–29.7)

## 2018-12-16 ENCOUNTER — Other Ambulatory Visit: Payer: Self-pay

## 2018-12-16 ENCOUNTER — Other Ambulatory Visit: Payer: Medicare Other | Admitting: *Deleted

## 2018-12-16 DIAGNOSIS — E782 Mixed hyperlipidemia: Secondary | ICD-10-CM

## 2018-12-16 LAB — LIPID PANEL
Chol/HDL Ratio: 4.4 ratio (ref 0.0–5.0)
Cholesterol, Total: 131 mg/dL (ref 100–199)
HDL: 30 mg/dL — ABNORMAL LOW (ref >39)
LDL Chol Calc (NIH): 66 mg/dL (ref 0–99)
Triglycerides: 213 mg/dL — ABNORMAL HIGH (ref 0–149)
VLDL Cholesterol Cal: 35 mg/dL (ref 5–40)

## 2018-12-16 LAB — HEPATIC FUNCTION PANEL
ALT: 11 IU/L (ref 0–44)
AST: 20 IU/L (ref 0–40)
Albumin: 4 g/dL (ref 3.7–4.7)
Alkaline Phosphatase: 63 IU/L (ref 39–117)
Bilirubin Total: 0.3 mg/dL (ref 0.0–1.2)
Bilirubin, Direct: 0.12 mg/dL (ref 0.00–0.40)
Total Protein: 7.2 g/dL (ref 6.0–8.5)

## 2018-12-19 ENCOUNTER — Other Ambulatory Visit: Payer: Self-pay

## 2018-12-19 ENCOUNTER — Ambulatory Visit (HOSPITAL_COMMUNITY)
Admission: RE | Admit: 2018-12-19 | Discharge: 2018-12-19 | Disposition: A | Discharge status: Home / Self Care | Payer: Medicare Other | Source: Ambulatory Visit | Attending: Internal Medicine | Admitting: Internal Medicine

## 2018-12-19 ENCOUNTER — Other Ambulatory Visit: Payer: Self-pay | Admitting: Internal Medicine

## 2018-12-19 ENCOUNTER — Ambulatory Visit (INDEPENDENT_AMBULATORY_CARE_PROVIDER_SITE_OTHER): Payer: Medicare Other | Admitting: Internal Medicine

## 2018-12-19 VITALS — BP 131/69 | HR 56 | Temp 97.9°F | Ht 71.0 in | Wt 222.1 lb

## 2018-12-19 DIAGNOSIS — N183 Chronic kidney disease, stage 3 unspecified: Secondary | ICD-10-CM

## 2018-12-19 DIAGNOSIS — I129 Hypertensive chronic kidney disease with stage 1 through stage 4 chronic kidney disease, or unspecified chronic kidney disease: Secondary | ICD-10-CM | POA: Diagnosis not present

## 2018-12-19 DIAGNOSIS — E785 Hyperlipidemia, unspecified: Secondary | ICD-10-CM

## 2018-12-19 DIAGNOSIS — Z0000 Encounter for general adult medical examination without abnormal findings: Secondary | ICD-10-CM

## 2018-12-19 DIAGNOSIS — I2510 Atherosclerotic heart disease of native coronary artery without angina pectoris: Secondary | ICD-10-CM

## 2018-12-19 DIAGNOSIS — I10 Essential (primary) hypertension: Secondary | ICD-10-CM

## 2018-12-19 DIAGNOSIS — R7303 Prediabetes: Secondary | ICD-10-CM

## 2018-12-19 DIAGNOSIS — Z79899 Other long term (current) drug therapy: Secondary | ICD-10-CM

## 2018-12-19 DIAGNOSIS — R001 Bradycardia, unspecified: Secondary | ICD-10-CM

## 2018-12-19 DIAGNOSIS — E875 Hyperkalemia: Secondary | ICD-10-CM | POA: Diagnosis not present

## 2018-12-19 DIAGNOSIS — M1990 Unspecified osteoarthritis, unspecified site: Secondary | ICD-10-CM

## 2018-12-19 LAB — BASIC METABOLIC PANEL
Anion gap: 12 (ref 5–15)
BUN: 36 mg/dL — ABNORMAL HIGH (ref 8–23)
CO2: 20 mmol/L — ABNORMAL LOW (ref 22–32)
Calcium: 9.4 mg/dL (ref 8.9–10.3)
Chloride: 108 mmol/L (ref 98–111)
Creatinine, Ser: 2.08 mg/dL — ABNORMAL HIGH (ref 0.61–1.24)
GFR calc Af Amer: 35 mL/min — ABNORMAL LOW (ref >60)
GFR calc non Af Amer: 30 mL/min — ABNORMAL LOW (ref >60)
Glucose, Bld: 118 mg/dL — ABNORMAL HIGH (ref 70–99)
Potassium: 4.8 mmol/L (ref 3.5–5.1)
Sodium: 140 mmol/L (ref 135–145)

## 2018-12-19 NOTE — Progress Notes (Signed)
   CC: CKD and hyperkalemia  HPI:  [name] is a 73 y.o. with CAD, HTN, OA, CKD 3, HLD, prediabetes who presents for follow up of ckd and hyperkalemia. Please see problem based charting for evaluation, assessment, and plan.  Past Medical History:  Diagnosis Date  . AAA (abdominal aortic aneurysm) ([place])    a. 2010 s/p stenting  . Anemia   . Arthritis    "pretty much all over" (09/06/2016)  . Blind left eye    "explosion knocked hole in retina" (09/06/2016)  . Bradycardia   . CAD (coronary artery disease) CABG in 2004   a. 2004 s/p CABG. b. NSTEMI 09/2016 -> cath with occ VG-distal Cx, managed medically.  . Carotid artery disease ([place])    a. 2012 dopplers with old LICA occlusion , RICA no significant abnormality  . Chronic lower back pain   . CKD (chronic kidney disease), stage III ([place])   . CVA (cerebral vascular accident) [facility])    a. 2013 R Carona radiata stroke   . GERD (gastroesophageal reflux disease)   . History of blood transfusion 2004   "related to OHS"  . Hyperlipidemia   . Hypertension   . Paroxysmal atrial fibrillation ([place]) 02/22/2009   a. on Coumadin   . Pre-diabetes   . PVD (peripheral vascular disease) ([place])    a. ?R subclavian stenosis by CT 09/2016.  Marland Kitchen Seborrheic dermatitis of scalp   . Thrombocytopenia ([place])   . Tobacco abuse    Review of Systems:    Review of Systems  Constitutional: Negative for chills and fever.  Respiratory: Negative for cough and shortness of breath.   Cardiovascular: Negative for chest pain.  Gastrointestinal: Negative for abdominal pain, nausea and vomiting.  Neurological: Negative for dizziness and headaches.     Physical Exam:  Vitals:   12/19/18 0835  BP: 131/69  Pulse: (!) 56  Temp: 97.9 F (36.6 C)  TempSrc: Oral  SpO2: 99%  Weight: 222 lb 1.6 oz (100.7 kg)  Height: 5\' 11"  (1.803 m)   Physical Exam  Constitutional: Appears well-developed and well-nourished. No distress.  HENT:  Head: Normocephalic and  atraumatic.  Eyes: Conjunctivae are normal.  Cardiovascular: Normal rate, regular rhythm and normal heart sounds.  Respiratory: Effort normal and breath sounds normal. No respiratory distress. No wheezes.  GI: Soft. Bowel sounds are normal. No distension. There is no tenderness.  Musculoskeletal: No edema.  Neurological: Is alert.  Skin: Not diaphoretic. No erythema.  Psychiatric: Normal mood and affect. Behavior is normal. Judgment and thought content normal.    Assessment & Plan:   See Encounters Tab for problem based charting.  Patient discussed with Dr. Philipp Ovens

## 2018-12-19 NOTE — Patient Instructions (Addendum)
It was a pleasure to see you today Mr. Ambroise. Please make the following changes:  -please stop lisinopril  -I will call you with your blood work results   If you have any questions or concerns, please call our clinic at [phone] between 9am-5pm and after hours call [phone] and ask for the internal medicine resident on call. If you feel you are having a medical emergency please call 911.   Thank you, we look forward to help you remain healthy!  Lars Mage, MD Internal Medicine PGY3

## 2018-12-19 NOTE — Progress Notes (Signed)
Thank you :)

## 2018-12-19 NOTE — Assessment & Plan Note (Signed)
Patient with asymptomatic bradycardia. EKG did not show any ischemic changes. No specific changes from prior ekg.

## 2018-12-19 NOTE — Assessment & Plan Note (Signed)
The patient's blood pressure during this visit was 131/69, 56. He is currently being prescribed metoprolol 25mg  bid, imdur 60mg  qd, and lisinopril 5mg  qd.  BP Readings from Last 3 Encounters:  12/19/18 131/69  12/05/18 139/78  11/26/18 (!) 142/73  The patient does not report palpitations, dizziness, chest pain, sob.  The patient was told to hold his lisinopril due to him having worsening kidney function, but the patient stated that he unfortunately mis-understood and did not stop the medication.   Assessment and Plan  -took off lisinopril from medication list to make it more clear for patient on his avs -bmp  -checked ekg as patient with hyperkalemia and low heart rate (even though he does have some low readings in the past)

## 2018-12-19 NOTE — Progress Notes (Signed)
Internal Medicine Clinic Attending  Case discussed with Dr. Chundi at the time of the visit.  We reviewed the resident's history and exam and pertinent patient test results.  I agree with the assessment, diagnosis, and plan of care documented in the resident's note. 

## 2018-12-19 NOTE — Assessment & Plan Note (Signed)
The patient has been having worsening kidney function over the past few months. His baseline creatinine 1.4-1.6. His creatinine increased to 2.2 in July and then was still elevated at 2.09 in august. Worsening kidney function was accompanied with hyperkalemia.  The patient was told to hold his lisinopril due to worsening kidney function, but the patient did not do so.   Assessment and plan   -repeat bmp to assess kidney function and hyperkalemia  -Will determine if patient needs kayexelate and referral to nephrology after obtain results

## 2018-12-19 NOTE — Progress Notes (Unsigned)
Nephrology referral placed

## 2018-12-20 ENCOUNTER — Telehealth: Payer: Self-pay | Admitting: Internal Medicine

## 2018-12-20 DIAGNOSIS — N183 Chronic kidney disease, stage 3 unspecified: Secondary | ICD-10-CM

## 2018-12-20 DIAGNOSIS — R195 Other fecal abnormalities: Secondary | ICD-10-CM

## 2018-12-20 LAB — FECAL OCCULT BLOOD, IMMUNOCHEMICAL: Fecal Occult Bld: POSITIVE — AB

## 2018-12-20 NOTE — Telephone Encounter (Signed)
I called the patient to discuss his positive FIT test as well as to go over his recent visits to the [facility].  I had called the patient after his last appointment and told him to hold his lisinopril and to follow-up in [facility] for a repeat BMP.  However, patient misunderstood my instructions and had continued his lisinopril.  He was seen in [facility] and his creatinine remained persistently elevated above 2.  His baseline creatinine is 1.4-1.6.  He was instructed to hold his lisinopril and a referral to nephrology was made by Dr. Maricela Bo.  I discussed this with the patient and he appeared to not know about the referral to nephrology.  I explained the reason why he was referred to nephrology and about his worsening kidney function.  Patient expressed understanding and is agreeable to referral.  I also talked to the patient today about his positive FIT test.  I explained to the patient that stool was positive for blood and that he would need a colonoscopy for further evaluation.  I have put in referral to GI for this.  Patient states that he is agreeable to colonoscopy and will follow up with them.

## 2018-12-24 ENCOUNTER — Other Ambulatory Visit: Payer: Self-pay | Admitting: Interventional Cardiology

## 2019-01-07 ENCOUNTER — Encounter: Payer: Self-pay | Admitting: Internal Medicine

## 2019-01-07 ENCOUNTER — Other Ambulatory Visit: Payer: Self-pay

## 2019-01-07 ENCOUNTER — Ambulatory Visit (INDEPENDENT_AMBULATORY_CARE_PROVIDER_SITE_OTHER): Payer: Medicare Other | Admitting: Internal Medicine

## 2019-01-07 VITALS — BP 156/92 | HR 53 | Temp 97.8°F | Ht 71.0 in | Wt 221.6 lb

## 2019-01-07 DIAGNOSIS — E538 Deficiency of other specified B group vitamins: Secondary | ICD-10-CM | POA: Diagnosis not present

## 2019-01-07 DIAGNOSIS — Z9119 Patient's noncompliance with other medical treatment and regimen: Secondary | ICD-10-CM

## 2019-01-07 DIAGNOSIS — N183 Chronic kidney disease, stage 3 unspecified: Secondary | ICD-10-CM | POA: Diagnosis not present

## 2019-01-07 DIAGNOSIS — I714 Abdominal aortic aneurysm, without rupture, unspecified: Secondary | ICD-10-CM

## 2019-01-07 DIAGNOSIS — M19049 Primary osteoarthritis, unspecified hand: Secondary | ICD-10-CM

## 2019-01-07 DIAGNOSIS — R911 Solitary pulmonary nodule: Secondary | ICD-10-CM | POA: Diagnosis not present

## 2019-01-07 DIAGNOSIS — I2510 Atherosclerotic heart disease of native coronary artery without angina pectoris: Secondary | ICD-10-CM

## 2019-01-07 DIAGNOSIS — I10 Essential (primary) hypertension: Secondary | ICD-10-CM

## 2019-01-07 DIAGNOSIS — M159 Polyosteoarthritis, unspecified: Secondary | ICD-10-CM

## 2019-01-07 DIAGNOSIS — I129 Hypertensive chronic kidney disease with stage 1 through stage 4 chronic kidney disease, or unspecified chronic kidney disease: Secondary | ICD-10-CM | POA: Diagnosis not present

## 2019-01-07 DIAGNOSIS — I712 Thoracic aortic aneurysm, without rupture, unspecified: Secondary | ICD-10-CM

## 2019-01-07 DIAGNOSIS — I6523 Occlusion and stenosis of bilateral carotid arteries: Secondary | ICD-10-CM

## 2019-01-07 DIAGNOSIS — R195 Other fecal abnormalities: Secondary | ICD-10-CM | POA: Insufficient documentation

## 2019-01-07 DIAGNOSIS — Z79899 Other long term (current) drug therapy: Secondary | ICD-10-CM

## 2019-01-07 NOTE — Assessment & Plan Note (Addendum)
-  This problem is chronic and stable -Patient received an IM vitamin B12 injection on his last visit -We will continue with oral vitamin B12 supplements -We will recheck a vitamin B12 level today  Addendum: -Patient's vitamin B12 level has improved to 323 from 243. -We will continue vitamin B12 supplements for now.

## 2019-01-07 NOTE — Assessment & Plan Note (Addendum)
-  This problem is chronic and stable -Patient is status post a AAA repair in 2016 -He has yet to follow-up with vascular surgeon this year (Dr. Donzetta Matters) -He states that he did called there office and has an appointment in a couple of months for repeat imaging of his AAA as well as carotid Dopplers -I again encouraged him to follow-up with vascular surgery for this and will have him follow-up with [place] in a month to ensure that he is able to make these appointments

## 2019-01-07 NOTE — Progress Notes (Signed)
   Subjective:    Patient ID: Dylan Morgan, male    DOB: [birth year], 73 y.o.   MRN: [id number]  HPI  I have seen and examined this patient.  Patient is here for routine follow-up of his hypertension and lung mass.  Patient states that he feels well except for some pain in both the shoulders which he suspects is secondary to arthritis.  He states that symptoms are better when he takes Tylenol.  Patient also had a fall yesterday when bending over to pick up a wrench.  He states that he lost his balance but denies any loss of consciousness or lightheadedness.  Patient denies any other complaints at this time and states that he is compliant with his medications.  He has had difficulty making follow-up appointments with his doctors.   Review of Systems  Constitutional: Negative.   HENT: Negative.   Respiratory: Negative.   Cardiovascular: Negative.   Gastrointestinal: Negative.   Musculoskeletal: Positive for arthralgias. Negative for back pain and gait problem.  Neurological: Negative.   Psychiatric/Behavioral: Negative.        Objective:   Physical Exam Constitutional:      Appearance: Normal appearance.  HENT:     Head: Normocephalic and atraumatic.  Cardiovascular:     Rate and Rhythm: Normal rate and regular rhythm.     Heart sounds: Normal heart sounds.  Pulmonary:     Effort: Pulmonary effort is normal.     Breath sounds: Normal breath sounds. No wheezing or rales.  Abdominal:     General: Bowel sounds are normal. There is no distension.     Palpations: Abdomen is soft.     Tenderness: There is no abdominal tenderness.  Musculoskeletal:        General: No swelling or tenderness.  Neurological:     General: No focal deficit present.     Mental Status: He is alert and oriented to person, place, and time.  Psychiatric:        Mood and Affect: Mood normal.        Behavior: Behavior normal.           Assessment & Plan:  Please see problem based charting for  assessment and plan:

## 2019-01-07 NOTE — Assessment & Plan Note (Addendum)
-  Patient has progressively worsening kidney function over the last couple of months -Patient baseline creatinine is 1.4-1.6 but he has been in the twos over the last year -We have held his lisinopril -We will recheck his BMP today -Referral to nephrology was placed but patient has yet to follow-up with them or make an appointment with them -I encouraged him to make an appointment with the nephrologist for further evaluation  Addendum: -Patient's creatinine is improved to 1.77 from the twos after holding his lisinopril. -We will continue to hold his lisinopril for now.  Patient to follow-up with nephrology as an outpatient.

## 2019-01-07 NOTE — Assessment & Plan Note (Signed)
-  This problem is chronic and stable -Patient is due for repeat carotid artery duplex this year -He has yet to follow-up with vascular surgery but states that he is made an appointment in a couple of months for repeat imaging -We will continue with statin and fenofibrate -No further work-up at this time

## 2019-01-07 NOTE — Assessment & Plan Note (Signed)
-  Patient has a history of a stable 4.6 cm ascending aortic aneurysm -His last follow-up with cardiothoracic surgery was in January of this year and he has missed subsequent follow-up appointments with them -He was also positive repeat CT chest done in August which he has missed as well -He was again given the number for the cardiothoracic surgeon and encouraged to make an appointment for follow-up with them -I emphasized the importance of following up for this medical problem and he states that he will give them a call to make follow-up appointment as well as get repeat imaging -No further work-up at this time

## 2019-01-07 NOTE — Assessment & Plan Note (Signed)
-  Patient was noted to have a new 2.4 cm lobular mass on the CT of his chest done in January of this year -This is followed up with a PET scan in March which showed increased uptake in the right apical lung nodule concerning for primary bronchogenic carcinoma -Patient is scheduled to have a biopsy of this nodule done on March 17 but did not go for the test -He is also missed subsequent appointments for repeat CT chest including one in August and has not followed up with cardiothoracic surgery either -I have emphasized to the patient the importance of following up with the CT surgeon especially given our concern for lung cancer -We once again gave the patient the number for the CT surgeon office and encouraged him to call to make an appointment -Patient expresses understanding and is in agreement with plan -I will follow-up with him in 4 weeks to ensure that these appointments have been made and that he is following up with these physicians -I also discussed talking to his daughter about this so that she can ensure that he follows up for these appointments.

## 2019-01-07 NOTE — Assessment & Plan Note (Signed)
-  Patient states that he has been having pain in multiple joints over the last couple of months which he suspects is secondary to arthritis. -He states that the pain in his shoulders have been worse over the last month and have been 4-5 out of 10 -He states that Tylenol helps with the pain but that the Voltaren gel has not been helpful -We will hold off on NSAIDs for the pain given worsening CKD -If his shoulder pain persists would consider shoulder injections to see if that would help with the pain -No further work-up at this time

## 2019-01-07 NOTE — Assessment & Plan Note (Signed)
-  Patient blood pressure was monitored today with SBP's in the 150s -His lisinopril was stopped secondary to his worsening CKD -We will hold off on increasing any medications currently -We will continue with metoprolol 25 mg twice daily, Imdur 60 mg daily.  Would consider adding amlodipine at his next visit if his blood pressure remains elevated

## 2019-01-07 NOTE — Assessment & Plan Note (Signed)
-  Patient had a positive FIT test in September -He states that he spoke with the gastroenterologist and is in the process of setting up an appointment for his colonoscopy -Encouraged the patient to follow-up with the GI and ensure that he gets this procedure done -No further work-up at this time

## 2019-01-07 NOTE — Patient Instructions (Addendum)
-  It was a pleasure seeing you again today -Like we discussed at your last visit it is extremely important that you follow-up with the cardiothoracic surgeon for repeat CT scan of your chest as well as possible biopsy of that lung nodule which is concerning for possible lung cancer -Please follow-up with the vascular surgeon has been to check your abdominal aortic aneurysm as well as your carotid arteries -Please follow-up with your kidney doctor given your worsening kidney function.  I placed a referral for this at your last visit -Your stool test was positive for blood and we referred you to gastroenterology for colonoscopy.  Please follow-up for this -We will check some blood work on you today including a B12 level as well as a kidney function -Please call me if you have any questions or concerns -Please follow up with me in 1 month

## 2019-01-08 ENCOUNTER — Telehealth: Payer: Self-pay

## 2019-01-08 ENCOUNTER — Encounter: Payer: Self-pay | Admitting: Internal Medicine

## 2019-01-08 LAB — BMP8+ANION GAP
Anion Gap: 12 mmol/L (ref 10.0–18.0)
BUN/Creatinine Ratio: 16 (ref 10–24)
BUN: 28 mg/dL — ABNORMAL HIGH (ref 8–27)
CO2: 21 mmol/L (ref 20–29)
Calcium: 9.1 mg/dL (ref 8.6–10.2)
Chloride: 109 mmol/L — ABNORMAL HIGH (ref 96–106)
Creatinine, Ser: 1.77 mg/dL — ABNORMAL HIGH (ref 0.76–1.27)
GFR calc Af Amer: 42 mL/min/{1.73_m2} — ABNORMAL LOW (ref >59)
GFR calc non Af Amer: 37 mL/min/{1.73_m2} — ABNORMAL LOW (ref >59)
Glucose: 96 mg/dL (ref 65–99)
Potassium: 4.5 mmol/L (ref 3.5–5.2)
Sodium: 142 mmol/L (ref 134–144)

## 2019-01-08 LAB — VITAMIN B12: Vitamin B-12: 323 pg/mL (ref 232–1245)

## 2019-01-08 NOTE — Telephone Encounter (Signed)
Requesting lab results, please call pt back.  

## 2019-01-08 NOTE — Telephone Encounter (Signed)
I called the patient with the results

## 2019-01-09 NOTE — Addendum Note (Signed)
Addended by: Hulan Fray on: 01/09/2019 05:49 PM   Modules accepted: Orders

## 2019-01-16 ENCOUNTER — Emergency Department (HOSPITAL_COMMUNITY): Payer: Medicare Other

## 2019-01-16 ENCOUNTER — Emergency Department (HOSPITAL_COMMUNITY)
Admission: EM | Admit: 2019-01-16 | Discharge: 2019-01-16 | Disposition: A | Discharge status: Home / Self Care | Payer: Medicare Other | Attending: Emergency Medicine | Admitting: Emergency Medicine

## 2019-01-16 DIAGNOSIS — R042 Hemoptysis: Secondary | ICD-10-CM | POA: Diagnosis not present

## 2019-01-16 DIAGNOSIS — Z79899 Other long term (current) drug therapy: Secondary | ICD-10-CM | POA: Insufficient documentation

## 2019-01-16 DIAGNOSIS — I129 Hypertensive chronic kidney disease with stage 1 through stage 4 chronic kidney disease, or unspecified chronic kidney disease: Secondary | ICD-10-CM | POA: Insufficient documentation

## 2019-01-16 DIAGNOSIS — N183 Chronic kidney disease, stage 3 unspecified: Secondary | ICD-10-CM | POA: Diagnosis not present

## 2019-01-16 DIAGNOSIS — L03113 Cellulitis of right upper limb: Secondary | ICD-10-CM | POA: Insufficient documentation

## 2019-01-16 DIAGNOSIS — Z87891 Personal history of nicotine dependence: Secondary | ICD-10-CM | POA: Diagnosis not present

## 2019-01-16 DIAGNOSIS — R918 Other nonspecific abnormal finding of lung field: Secondary | ICD-10-CM | POA: Insufficient documentation

## 2019-01-16 DIAGNOSIS — I2510 Atherosclerotic heart disease of native coronary artery without angina pectoris: Secondary | ICD-10-CM | POA: Insufficient documentation

## 2019-01-16 DIAGNOSIS — R791 Abnormal coagulation profile: Secondary | ICD-10-CM | POA: Diagnosis not present

## 2019-01-16 DIAGNOSIS — Z951 Presence of aortocoronary bypass graft: Secondary | ICD-10-CM | POA: Diagnosis not present

## 2019-01-16 DIAGNOSIS — Z7901 Long term (current) use of anticoagulants: Secondary | ICD-10-CM | POA: Diagnosis not present

## 2019-01-16 LAB — PROTIME-INR
INR: 5.3 (ref 0.8–1.2)
Prothrombin Time: 47.8 seconds — ABNORMAL HIGH (ref 11.4–15.2)

## 2019-01-16 LAB — CBC WITH DIFFERENTIAL/PLATELET
Abs Immature Granulocytes: 0.04 10*3/uL (ref 0.00–0.07)
Basophils Absolute: 0 10*3/uL (ref 0.0–0.1)
Basophils Relative: 0 %
Eosinophils Absolute: 0.1 10*3/uL (ref 0.0–0.5)
Eosinophils Relative: 1 %
HCT: 36.3 % — ABNORMAL LOW (ref 39.0–52.0)
Hemoglobin: 11.2 g/dL — ABNORMAL LOW (ref 13.0–17.0)
Immature Granulocytes: 1 %
Lymphocytes Relative: 12 %
Lymphs Abs: 0.9 10*3/uL (ref 0.7–4.0)
MCH: 28.5 pg (ref 26.0–34.0)
MCHC: 30.9 g/dL (ref 30.0–36.0)
MCV: 92.4 fL (ref 80.0–100.0)
Monocytes Absolute: 1 10*3/uL (ref 0.1–1.0)
Monocytes Relative: 12 %
Neutro Abs: 5.9 10*3/uL (ref 1.7–7.7)
Neutrophils Relative %: 74 %
Platelets: 298 10*3/uL (ref 150–400)
RBC: 3.93 MIL/uL — ABNORMAL LOW (ref 4.22–5.81)
RDW: 14.6 % (ref 11.5–15.5)
WBC: 8 10*3/uL (ref 4.0–10.5)
nRBC: 0 % (ref 0.0–0.2)

## 2019-01-16 LAB — BASIC METABOLIC PANEL
Anion gap: 13 (ref 5–15)
BUN: 37 mg/dL — ABNORMAL HIGH (ref 8–23)
CO2: 15 mmol/L — ABNORMAL LOW (ref 22–32)
Calcium: 9.7 mg/dL (ref 8.9–10.3)
Chloride: 108 mmol/L (ref 98–111)
Creatinine, Ser: 2.14 mg/dL — ABNORMAL HIGH (ref 0.61–1.24)
GFR calc Af Amer: 34 mL/min — ABNORMAL LOW (ref >60)
GFR calc non Af Amer: 29 mL/min — ABNORMAL LOW (ref >60)
Glucose, Bld: 91 mg/dL (ref 70–99)
Potassium: 4.4 mmol/L (ref 3.5–5.1)
Sodium: 136 mmol/L (ref 135–145)

## 2019-01-16 MED ORDER — CLINDAMYCIN HCL 300 MG PO CAPS: 300.0000 mg | ORAL_CAPSULE | Freq: Three times a day (TID) | ORAL | 0 refills | Status: AC

## 2019-01-16 MED ORDER — CLINDAMYCIN PHOSPHATE 600 MG/50ML IV SOLN
600.0000 mg | Freq: Once | INTRAVENOUS | Status: AC
  Administered 2019-01-16: 600 mg via INTRAVENOUS
  Filled 2019-01-16: qty 50

## 2019-01-16 NOTE — ED Provider Notes (Signed)
Medical screening examination/treatment/procedure(s) were conducted as a shared visit with non-physician practitioner(s) and myself.  I personally evaluated the patient during the encounter.    73 year old male presents with hemoptysis.  He is on Coumadin.  He also noticed right upper extremity erythema.  On exam he has evidence of cellulitis.  Will start on antibiotics.  Awaiting remaining work-up   Lacretia Leigh, MD 01/16/19 Valerie Roys

## 2019-01-16 NOTE — ED Provider Notes (Signed)
Care transferred from [place] at shift change. See note for full HPI.  In summation, hx of Afib coagulated on Coumadin who presents for evaluation of hemoptysis over the last 24 hours.  Is been coughing up clear sputum however this turned blood-tinged over the last 24 hours.  He denies any chest pain, shortness of breath, history of DVT, PE, recent mobilization, calf swelling, redness or warmth.  He did recently have his INR checked.   Plan to check labs, INR, chest x-ray, EKG.  Low suspicion for PE as no DVT on exam.  He is without tachycardia, tachypnea or hypoxia.  Plan to likely DC home if labs negative.  Pneumonia?? Low suspicion for PE. Dc home likely Physical Exam  BP 126/74    Pulse 82    Temp 98.6 F (37 C) (Oral)    Resp 15    SpO2 98%   Physical Exam Vitals signs and nursing note reviewed.  Constitutional:      General: He is not in acute distress.    Appearance: He is well-developed. He is not ill-appearing, toxic-appearing or diaphoretic.  HENT:     Head: Normocephalic and atraumatic.     Nose: Nose normal.     Mouth/Throat:     Mouth: Mucous membranes are moist.     Pharynx: Oropharynx is clear.  Eyes:     Pupils: Pupils are equal, round, and reactive to light.  Neck:     Musculoskeletal: Normal range of motion and neck supple.  Cardiovascular:     Rate and Rhythm: Normal rate and regular rhythm.     Pulses: Normal pulses.     Heart sounds: Normal heart sounds.  Pulmonary:     Effort: Pulmonary effort is normal. No respiratory distress.     Breath sounds: Normal breath sounds.  Abdominal:     General: Bowel sounds are normal. There is no distension.     Palpations: Abdomen is soft.  Musculoskeletal: Normal range of motion.     Comments: Moves all 4 extremities without difficulty.  Homans sign negative.  Compartments soft.  No tenderness, redness or warmth of bilateral calves.  Skin:    General: Skin is warm and dry.     Comments: Erythema, warmth, soft  tissue swelling to dorsal aspect of right forearm.  No fluctuance or induration.  Brisk capillary refill.  Neurological:     Mental Status: He is alert.       ED Course/Procedures     Procedures Labs Reviewed  PROTIME-INR - Abnormal; Notable for the following components:      Result Value   Prothrombin Time 47.8 (*)    INR 5.3 (*)    All other components within normal limits  CBC WITH DIFFERENTIAL/PLATELET - Abnormal; Notable for the following components:   RBC 3.93 (*)    Hemoglobin 11.2 (*)    HCT 36.3 (*)    All other components within normal limits  BASIC METABOLIC PANEL - Abnormal; Notable for the following components:   CO2 15 (*)    BUN 37 (*)    Creatinine, Ser 2.14 (*)    GFR calc non Af Amer 29 (*)    GFR calc Af Amer 34 (*)    All other components within normal limits  Dg Chest 2 View  Result Date: 01/16/2019 CLINICAL DATA:  Hemoptysis EXAM: CHEST - 2 VIEW COMPARISON:  04/19/2018 FINDINGS: Postsurgical changes to the sternum and mediastinum. Stable cardiomegaly. Tortuous aorta. Interval increase in size of masslike  opacity in the medial aspect of the right lung apex measuring approximately 5 x 3 cm (previously measured 2.4 cm on 04/18/2018). Left lung is clear. No pleural effusion or pneumothorax. IMPRESSION: Interval increase in size of right apical lung mass now measuring approximately 5 x 3 cm (previously measured 2.4 cm on 04/18/2018). Findings highly suspicious for primary pulmonary neoplasm. Electronically Signed   By: Davina Poke M.D.   On: 01/16/2019 18:52   Ct Chest Wo Contrast  Result Date: 01/16/2019 CLINICAL DATA:  73 year old male with hemoptysis. EXAM: CT CHEST WITHOUT CONTRAST TECHNIQUE: Multidetector CT imaging of the chest was performed following the standard protocol without IV contrast. COMPARISON:  Chest radiograph dated 01/16/2019 and CT dated 04/18/2018 FINDINGS: Evaluation of this exam is limited in the absence of intravenous contrast.  Cardiovascular: There is no cardiomegaly or pericardial effusion. Multi vessel coronary vascular calcification. There is moderate atherosclerotic calcification of the thoracic aorta. The aorta is tortuous. Dilated ascending aorta measures up to 4.6 cm in diameter similar to prior CT. Follow-up as per recommendation of the prior CT. Evaluation of the aorta and pulmonary arteries is limited on this noncontrast CT. Mediastinum/Nodes: No hilar adenopathy. Top-normal right paratracheal lymph node measures up to 10 mm in short axis similar to prior CT. No mediastinal adenopathy. The esophagus is grossly unremarkable. No mediastinal fluid collection. Lungs/Pleura: Right upper lobe mass measures 4.7 x 3.6 cm (previously 2.0 x 1.8 cm). This mass abuts the medial right upper lobe pleural surface and extends into the right upper mediastinum posterior to the subclavian artery and abuts the right first and second costovertebral junction. Punctate focus of air noted within the inferior aspect of this mass. Mild nodularity in the adjacent lung parenchyma noted. This is concerning for malignancy. Clinical correlation and pulmonary consult is advised. The left lung is clear. There is no pleural effusion pneumothorax. The central airways are patent. Upper Abdomen: Small gallstone. Probable mild fatty liver. The visualized upper abdomen is otherwise unremarkable. Musculoskeletal: Degenerative changes of the spine. Median sternotomy wires. No acute osseous pathology. IMPRESSION: 1. Interval increase in the size of the right upper lobe mass concerning for malignancy. Clinical correlation and pulmonary consult is advised. 2. Dilatation of the ascending aorta measuring up to 4.6 cm in diameter. Follow-up as per recommendation of the prior CT. 3. Cholelithiasis. Aortic Atherosclerosis (ICD10-I70.0). Electronically Signed   By: Anner Crete M.D.   On: 01/16/2019 20:71   MDM  73 year old male appears otherwise well presents for  evaluation of hemoptysis.  Blood-tinged sputum.  Has had productive cough.  He is coagulated on Coumadin.  Has not had his INR checked in 1 month.  Plan to check labs.  Given cough with sputum production will obtain chest x-ray.  No evidence of DVT on exam and he is currently anticoagulated.  No tachycardia, tachypnea or hypoxia.  Low suspicion for PE.  INR elevated at 5.3, will have patient hold his Coumadin have close follow-up with his Coumadin clinic. CBC without leukocytosis, hemoglobin 0.2, at baseline.  Denies any melena, hematochezia. Metabolic panel with creatinine at 2.14, previous 1.7, 2.08 GFR at patient's baseline.  Likely has CKD. X-ray does show enlarging pulmonary mass.  Patient is followed outpatient for this and has follow-up on the 21st.  He is not sure if he is followed by oncology or pulmonary.  Obtain CT scan.  He does have cellulitis to right upper extremity.  Given been clindamycin.  He is afebrile, without leukocytosis.  Low suspicion for sepsis  or sirs.  Will DC home with clindamycin.  Cellulitis outlined.  Discussed return precautions.  CT with overall enlargement of lung mass.  He does have follow-up with me on the 21st for biopsy.  He does have known ascending aneurysm which is stable at 4.6 cm, consistent with previous.  He denies chest pain, shortness of breath.  Will have patient hold his Coumadin and have close follow-up with his INR clinic.  Also given antibiotics for his cellulitis.  Discussed return precautions.  Patient voiced understanding agreeable follow-up.  Seen evaluated by Dr. Zenia Resides who agrees with the treatment, plan and disposition.      Wilmont Olund A, PA-C 01/16/19 2042    Lacretia Leigh, MD 01/20/19 1217

## 2019-01-16 NOTE — ED Triage Notes (Signed)
Per EMS-spitting up blood after dinner last night-states occurs after he coughs to clear his throat-no fever, no other symptoms
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
## 2019-01-16 NOTE — Discharge Instructions (Addendum)
Follow-up with the pulmonary physician for a biopsy of your lung mass.  Take the antibiotics as prescribed for your cellulitis.  If you about high fever, worsening redness, warmth please seek reevaluation.

## 2019-01-17 ENCOUNTER — Encounter: Payer: Self-pay | Admitting: Surgery

## 2019-01-20 ENCOUNTER — Telehealth: Payer: Self-pay | Admitting: Pharmacist

## 2019-01-20 ENCOUNTER — Ambulatory Visit (INDEPENDENT_AMBULATORY_CARE_PROVIDER_SITE_OTHER): Payer: Medicare Other | Admitting: Pharmacist

## 2019-01-20 DIAGNOSIS — Z7901 Long term (current) use of anticoagulants: Secondary | ICD-10-CM | POA: Diagnosis not present

## 2019-01-20 NOTE — Telephone Encounter (Signed)
Patient called saying he was seen at [facility] for coughing up blood. He states he was told to stop taking warfarin by the Dr. His INR was 5.3 in the ED. He was also dx with cellulitis and started on abx. He has been off coumadin since 10/16.  Advised that I would send a message to Dr. Irish Lack as far if/when to resume warfarin. Patient was not admitted and did not require any blood transfusion. He has a hx of TIA. He also has a mass in his right upper lobe that has grown in size.

## 2019-01-20 NOTE — Telephone Encounter (Signed)
Informed patient that Dr. Irish Lack advised he resume his warfarin. We will reduce the patients dose since his INR has been high recently/bleeding. Patient states he has been taking 1 tablet daily which is a lower dose than we had documented. I will reduce dose about 15% from what the patient states hes been taking. Adivsed to restart at a dose of 1 tablet (3mg ) daily except 1/2 tablet every Tue and Thursday. Follow up in 1 week.

## 2019-01-20 NOTE — Telephone Encounter (Signed)
I think we can restart COumadin but will need close INR f/u due to antibiotics and prior high readings.

## 2019-01-22 ENCOUNTER — Other Ambulatory Visit: Payer: Self-pay

## 2019-01-22 ENCOUNTER — Other Ambulatory Visit: Payer: Self-pay | Admitting: *Deleted

## 2019-01-22 ENCOUNTER — Encounter: Payer: Self-pay | Admitting: Surgery

## 2019-01-22 ENCOUNTER — Other Ambulatory Visit: Payer: Medicare Other

## 2019-01-22 ENCOUNTER — Ambulatory Visit: Payer: Medicare Other | Admitting: Surgery

## 2019-01-22 VITALS — BP 99/65 | HR 92 | Temp 97.7°F | Resp 20 | Ht 71.0 in | Wt 204.0 lb

## 2019-01-22 DIAGNOSIS — R918 Other nonspecific abnormal finding of lung field: Secondary | ICD-10-CM

## 2019-01-22 DIAGNOSIS — I712 Thoracic aortic aneurysm, without rupture, unspecified: Secondary | ICD-10-CM

## 2019-01-22 NOTE — Progress Notes (Signed)
HPI:  The patient is a 73 year old gentleman with a history of hypertension, hyperlipidemia, paroxysmal atrial fibrillation on Coumadin, prior stroke with known left internal carotid artery occlusion, stage III chronic kidney disease, abdominal aortic aneurysm status post EVARin 2010, and coronary artery disease status post CABG in 2004. The patient had a non-ST segment elevation MI in June 2018 and underwent cardiac catheterization showing severe native three-vessel coronary disease and severe vein graft disease in the graft to an obtuse marginal branch. He has been treated medically and continued on Coumadin.He had a CT scan of the chest, abdomen, and pelvis when he presented in June 2018 and this showed a 4.6 cm fusiform ascendinganeurysm.   He returned to see me in January 2020 for follow-up of his aneurysm.  His CT scan at that time showed a new 2.4 cm lobular mass medially at the right lung apex which was not seen on his prior CT scan from June 2018.  There was some speculation it was felt that this was most likely a neoplasm.  He was seen by Dr. Roxan Hockey in February for consideration of navigational bronchoscopy for biopsy.  He had a PET scan which showed intense hypermetabolic activity within the right apical lung nodule compatible with primary bronchogenic carcinoma.  There is mild increased uptake within morphologically benign appearing right paratracheal lymph node.  Dr. Koleen Nimrod felt that a CT-guided needle biopsy would be a better option due to the location of the mass.  The patient was scheduled for a CT-guided biopsy in March but did not show up for it.  He was seen back by his primary physician multiple times since then and each time this lung mass was discussed and his primary physician strongly encouraged him to return to see CT surgery for biopsy.  There was documented the patient understood and agreed to return to see [place] but never did.  He now returns for follow-up.  He is here  with his daughter and another family member.  Current Outpatient Medications  Medication Sig Dispense Refill   albuterol (PROVENTIL HFA;VENTOLIN HFA) 108 (90 Base) MCG/ACT inhaler Inhale 2 puffs into the lungs every 6 (six) hours as needed for wheezing or shortness of breath. 1 Inhaler 2   atorvastatin (LIPITOR) 80 MG tablet TAKE 1 TABLET BY MOUTH ONCE DAILY AT  6PM. (Patient taking differently: Take 80 mg by mouth daily. ) 90 tablet 0   Coenzyme Q10 (CO Q 10 PO) Take by mouth.     diclofenac sodium (VOLTAREN) 1 % GEL Apply 4 g topically 4 (four) times daily. 100 g 1   fenofibrate (TRICOR) 145 MG tablet Take 1 tablet (145 mg total) by mouth daily. 30 tablet 11   isosorbide mononitrate (IMDUR) 60 MG 24 hr tablet Take 1 tablet (60 mg total) by mouth daily. 90 tablet 3   loratadine (CLARITIN) 10 MG tablet Take 1 tablet (10 mg total) by mouth daily. 30 tablet 2   metoprolol succinate (TOPROL-XL) 25 MG 24 hr tablet TAKE 1 TABLET BY MOUTH ONCE DAILY (Patient taking differently: Take 25 mg by mouth daily. ) 90 tablet 3   Multiple Vitamin (MULTIVITAMIN) tablet Take 1 tablet by mouth daily. 90 tablet 3   vitamin B-12 (CYANOCOBALAMIN) 1000 MCG tablet Take 1 tablet (1,000 mcg total) by mouth daily. 90 tablet 0   warfarin (COUMADIN) 3 MG tablet TAKE 1 TABLET BY MOUTH  DAILY ( STOPPING ELIQUIS ) (Patient taking differently: Take 3 mg by mouth one time only  at 6 PM. ) 50 tablet 0   No current facility-administered medications for this visit.      Physical Exam: BP 99/65    Pulse 92    Temp 97.7 F (36.5 C) (Skin)    Resp 20    Ht 5\' 11"  (1.803 m)    Wt 204 lb (92.5 kg)    SpO2 97% Comment: RA   BMI 28.45 kg/m  He is an elderly, chronically ill-appearing gentleman in no distress. There is no cervical or supraclavicular adenopathy. Lungs are clear. Cardiac exam shows a regular rate and rhythm with normal heart sounds.  Diagnostic Tests:  CLINICAL DATA:  73 year old male with  hemoptysis.  EXAM: CT CHEST WITHOUT CONTRAST  TECHNIQUE: Multidetector CT imaging of the chest was performed following the standard protocol without IV contrast.  COMPARISON:  Chest radiograph dated 01/16/2019 and CT dated 04/18/2018  FINDINGS: Evaluation of this exam is limited in the absence of intravenous contrast.  Cardiovascular: There is no cardiomegaly or pericardial effusion. Multi vessel coronary vascular calcification. There is moderate atherosclerotic calcification of the thoracic aorta. The aorta is tortuous. Dilated ascending aorta measures up to 4.6 cm in diameter similar to prior CT. Follow-up as per recommendation of the prior CT. Evaluation of the aorta and pulmonary arteries is limited on this noncontrast CT.  Mediastinum/Nodes: No hilar adenopathy. Top-normal right paratracheal lymph node measures up to 10 mm in short axis similar to prior CT. No mediastinal adenopathy. The esophagus is grossly unremarkable. No mediastinal fluid collection.  Lungs/Pleura: Right upper lobe mass measures 4.7 x 3.6 cm (previously 2.0 x 1.8 cm). This mass abuts the medial right upper lobe pleural surface and extends into the right upper mediastinum posterior to the subclavian artery and abuts the right first and second costovertebral junction. Punctate focus of air noted within the inferior aspect of this mass. Mild nodularity in the adjacent lung parenchyma noted. This is concerning for malignancy. Clinical correlation and pulmonary consult is advised. The left lung is clear. There is no pleural effusion pneumothorax. The central airways are patent.  Upper Abdomen: Small gallstone. Probable mild fatty liver. The visualized upper abdomen is otherwise unremarkable.  Musculoskeletal: Degenerative changes of the spine. Median sternotomy wires. No acute osseous pathology.  IMPRESSION: 1. Interval increase in the size of the right upper lobe mass concerning for  malignancy. Clinical correlation and pulmonary consult is advised. 2. Dilatation of the ascending aorta measuring up to 4.6 cm in diameter. Follow-up as per recommendation of the prior CT. 3. Cholelithiasis.  Aortic Atherosclerosis (ICD10-I70.0).   Electronically Signed   By: Anner Crete M.D.   On: 01/16/2019 20:12   Impression:  He has an enlarging right upper lobe lung mass that is almost certainly a bronchogenic carcinoma.  He was scheduled for CT-guided needle biopsy in March 2020 but did not show up for that and has not returned for further appointments until now despite repeated encouragement from his primary physician.  He is not a candidate for surgical treatment but may be a candidate for radiation therapy and I think a CT-guided needle biopsy would be valuable in documenting that it is cancer and what type.  I reviewed the CT images with the patient and his family.  All their questions have been answered.  He is agreeable to proceed with CT-guided needle biopsy.  Plan:  He will be scheduled for a CT-guided needle biopsy of the right apical lung mass.  After we have the results we will refer  him to oncology for evaluation.  I spent 15 minutes performing this established patient evaluation and > 50% of this time was spent face to face counseling and coordinating the care of this patient's right apical lung mass.    Gaye Pollack, MD [facility] [phone]

## 2019-01-24 ENCOUNTER — Telehealth: Payer: Self-pay | Admitting: Pharmacist

## 2019-01-24 ENCOUNTER — Encounter (HOSPITAL_COMMUNITY): Payer: Self-pay

## 2019-01-24 NOTE — Telephone Encounter (Signed)
Patient is to have a CT lung biopsy on 10/29. Patient is to hold coumadin 4 days prior. Discussed with Dr. Irish Lack about lovenox bridge as patient is typically bridged with lovenox due to hx of TIA. Patient recently had hemoptysis with an INR of 5.3 on 10/15. He help warfarin and it was resumed on 10/19 at a lower dose. Patient already scheduled for INR check on Monday 10/26. Dr. Irish Lack would like [place] to check INR on Monday, depending on his INR, will determine if he needs a bridge prior to procedure. Cost of lovenox has been an issue with patient in the past. If INR is low, could consider giving a few days of Eliquis samples to cover him up until 2 days prior to biopsy. Will re discuss with Dr. Irish Lack on Monday the plan after we get the INR results.

## 2019-01-24 NOTE — Progress Notes (Signed)
[facility] Male, 73 y.o., Nov 11, 1945 MRN:  [id number] Phone:  [phone] (H) ... PCP:  Aldine Contes, MD Primary [place] Medicare/[employer] Medicare Hmo/Ppo Next Appt With Radiology ([facility]-CT 3) 01/30/2019 at 11:00 AM  RE: Blood Thinners/Biopsy Received: [place], Jayadeep S, MD  Dortha Kern, Harlon Flor, [facility] [place]  OK to hold COumadin for 4 days prior to biopsy.    JV   Previous Messages  ----- Message -----  From: Lenore Cordia  Sent: 01/23/2019 10:06 AM EDT  To: Jettie Booze, MD  Subject: Blood Thinners/Biopsy               Hello Dr Irish Lack,   Mr. Mcbee has been scheduled for a CT Biopsy 01/30/19, he is on Coumadin  And needs to be off 4 days prior to procedure. Your permission is needed.   Rivky Clendenning      From: Arne Cleveland, MD  Sent: 11/29/2018 11:50 AM EDT  To: Lenore Cordia  Subject: RE: Biopsy                    Ok   CT core RUL @ [facility]  Previously requested/approved 06/05/2018 but pt no show   DD[facility]   ----- Message -----  From: Lenore Cordia  Sent: 11/29/2018 11:35 AM EDT  To: Ir Procedure Requests  Subject: Biopsy                      Procedure Requested: CT Biopsy    Reason for Procedure: Right lung nodule    Provider Requesting: Dr. Melrose Nakayama  Provider Telephone:  [phone]    Other Info:

## 2019-01-27 ENCOUNTER — Encounter: Payer: Self-pay | Admitting: Internal Medicine

## 2019-01-27 ENCOUNTER — Other Ambulatory Visit: Payer: Self-pay

## 2019-01-27 ENCOUNTER — Ambulatory Visit (INDEPENDENT_AMBULATORY_CARE_PROVIDER_SITE_OTHER): Payer: Medicare Other | Admitting: *Deleted

## 2019-01-27 ENCOUNTER — Ambulatory Visit (HOSPITAL_COMMUNITY): Admission: RE | Admit: 2019-01-27 | Payer: Medicare Other | Source: Ambulatory Visit

## 2019-01-27 DIAGNOSIS — Z5181 Encounter for therapeutic drug level monitoring: Secondary | ICD-10-CM | POA: Diagnosis not present

## 2019-01-27 DIAGNOSIS — I480 Paroxysmal atrial fibrillation: Secondary | ICD-10-CM

## 2019-01-27 DIAGNOSIS — Z20822 Contact with and (suspected) exposure to covid-19: Secondary | ICD-10-CM

## 2019-01-27 LAB — POCT INR: INR: 1.6 — AB (ref 2.0–3.0)

## 2019-01-27 NOTE — Patient Instructions (Addendum)
Hold Coumadin 10/26, 10/27, 10/28. Take an extra 1/2 tablet the evening of the procedure unless with Dr tells you other wise. Resume normal dose 1 tablet daily except 1/2 tablet on Tuesday and Thursday.  Call Coumadin clinic with questions [phone].

## 2019-01-27 NOTE — Telephone Encounter (Signed)
Patient INR on 10/26 was 1.6. Patient did not hold coumadin last night as he was directed. Discussed with Dr. Irish Lack over the phone. Due to pt recent bleed and the fact that patient took his coumadin last night (INR could still rise a little before it falls) we have decided not to bridge patient. This was a tough decision weighing his risk of stroke and bleeding.  Patient advised by Ebony Hail, RN to not take any more coumadin until his biopsy. Resume coumadin with an extra 1/2 tablet the night of biopsy as long as ok with IR.

## 2019-01-27 NOTE — Progress Notes (Signed)
Spoke with Edythe Lynn D who consulted with Dr. Irish Lack about pt holding coumadin- no Lovenox or Eliquis at this time.  Dosing instructions discussed with Melissa pharm D.

## 2019-01-28 ENCOUNTER — Other Ambulatory Visit: Payer: Self-pay | Admitting: Internal Medicine

## 2019-01-28 DIAGNOSIS — G4730 Sleep apnea, unspecified: Secondary | ICD-10-CM

## 2019-01-28 LAB — NOVEL CORONAVIRUS, NAA: SARS-CoV-2, NAA: NOT DETECTED

## 2019-01-29 ENCOUNTER — Other Ambulatory Visit: Payer: Self-pay | Admitting: Radiology

## 2019-01-30 ENCOUNTER — Ambulatory Visit (HOSPITAL_COMMUNITY): Admission: RE | Admit: 2019-01-30 | Payer: Medicare Other | Source: Ambulatory Visit

## 2019-02-04 ENCOUNTER — Encounter: Payer: Self-pay | Admitting: Internal Medicine

## 2019-02-05 ENCOUNTER — Other Ambulatory Visit: Payer: Self-pay | Admitting: Internal Medicine

## 2019-02-05 ENCOUNTER — Telehealth: Payer: Self-pay

## 2019-02-05 DIAGNOSIS — S46009A Unspecified injury of muscle(s) and tendon(s) of the rotator cuff of unspecified shoulder, initial encounter: Secondary | ICD-10-CM

## 2019-02-05 NOTE — Telephone Encounter (Signed)
Pt's daughter Olegario Shearer called office to report that pt has been having R shoulder pain and is suspected to have a rotator cuff tear. She is concerned about whether or not pt should have lung bx that is scheduled 02/12/19. States pt will see an orthopedist within the next few days. She reports that pt is able to move arm fairly well and his pain has lessened over the last few days (not having to take pain med). After discussing the issue, it is decided upon to proceed w/ lung bx unless something changes or the orthopedist advises against it. She states she will promptly notify office of any changes.

## 2019-02-06 ENCOUNTER — Other Ambulatory Visit: Payer: Self-pay

## 2019-02-06 ENCOUNTER — Ambulatory Visit (INDEPENDENT_AMBULATORY_CARE_PROVIDER_SITE_OTHER): Payer: Medicare Other | Admitting: Pharmacist

## 2019-02-06 DIAGNOSIS — Z5181 Encounter for therapeutic drug level monitoring: Secondary | ICD-10-CM | POA: Diagnosis not present

## 2019-02-06 DIAGNOSIS — I480 Paroxysmal atrial fibrillation: Secondary | ICD-10-CM | POA: Diagnosis not present

## 2019-02-06 DIAGNOSIS — Z7901 Long term (current) use of anticoagulants: Secondary | ICD-10-CM | POA: Diagnosis not present

## 2019-02-06 LAB — POCT INR: INR: 2 (ref 2.0–3.0)

## 2019-02-06 NOTE — Patient Instructions (Addendum)
11/06: last dose of coumadin  11/07: No coumadin. Take Eliquis 5mg  at [facility]  11/8: No coumadin. Take Eliquis 5mg  at 8AM and 8PM  11/9: No coumadin. Take Eliquis 5mg  at 8AM only  11/10: No coumadin, no Eliquis  11/11: Procedure Day. Take Coumadin 3mg   11/12: Take coumadin 3mg  and Eliquis 5mg  at 8AM and 8PM  11/13: Take coumadin 3mg  and Eliquis 5mg  at 8AM and 8PM  11/14: Take coumadin 3mg  and Eliquis 5mg  at 8AM and 8PM  11/15: Take coumadin 3mg   11/16: Take coumadin 3mg   11/17: Come in for INR check

## 2019-02-07 ENCOUNTER — Telehealth: Payer: Self-pay | Admitting: Pharmacist

## 2019-02-07 NOTE — Telephone Encounter (Signed)
Dr. Cyndia Bent,   We saw patient in coumadin clinic today. Patient has a history of TIA and is typically bridge when off of coumadin. Cost of lovenox is a concern for patient. Spoke with Dr. Irish Lack who advised ok to use Eliquis samples as a bridge. He will be off of coumadin for 4 days prior to procedure and Eliquis a little over 36 hours prior. I wanted to make sure this was ok with you and you would be ok doing the biopsy. Just trying to minimize his time off of anticoagulation. If not, we will make adjustments in the plan so that the patient can have his biopsy done.

## 2019-02-08 ENCOUNTER — Other Ambulatory Visit (HOSPITAL_COMMUNITY)
Admission: RE | Admit: 2019-02-08 | Discharge: 2019-02-08 | Disposition: A | Discharge status: Home / Self Care | Payer: Medicare Other | Source: Ambulatory Visit | Attending: Surgery | Admitting: Surgery

## 2019-02-08 DIAGNOSIS — Z20828 Contact with and (suspected) exposure to other viral communicable diseases: Secondary | ICD-10-CM | POA: Diagnosis not present

## 2019-02-08 DIAGNOSIS — Z01812 Encounter for preprocedural laboratory examination: Secondary | ICD-10-CM | POA: Diagnosis present

## 2019-02-09 LAB — NOVEL CORONAVIRUS, NAA (HOSP ORDER, SEND-OUT TO REF LAB; TAT 18-24 HRS): SARS-CoV-2, NAA: NOT DETECTED

## 2019-02-10 ENCOUNTER — Encounter (HOSPITAL_COMMUNITY): Payer: Self-pay

## 2019-02-10 NOTE — Progress Notes (Signed)
[facility] Male, 73 y.o., Sep 08, 1945 MRN:  [id number] Phone:  [phone] (OTHER PHONE) PCP:  Aldine Contes, MD Primary [place] Medicare/[employer] Medicare Hmo/Ppo Next Appt With Radiology ([facility]-CT 3) 02/12/2019 at 11:00 AM  RE: Blood thinner/BX Received: 4 days ago Message Contents  Pastos, Carollee Massed, RPH  Arnoldo Lenis, MD  Dr. Cyndia Bent,   We saw patient in coumadin clinic today. Patient has a history of TIA and is typically bridge when off of coumadin. Cost of lovenox is a concern for patient. Spoke with Dr. Irish Lack who advised ok to use Eliquis samples as a bridge. He will be off of coumadin for 4 days prior to procedure and Eliquis a little over 36 hours prior. I wanted to make sure this was ok with you and you would be ok doing the biopsy. Just trying to minimize his time off of anticoagulation. If not, we will make adjustments in the plan so that the patient can have his biopsy done.   Thank you  Melissa    Ramond Dial, Pharm.D, [facility]  [address] [place], [place] [address]  Phone: [phone]; Fax: [phone]   Previous Messages  ----- Message -----  From: Jettie Booze, MD  Sent: 02/02/2019 11:36 PM EST  To: Ramond Dial, RPH  Subject: RE: Blood thinner/BX               If bopsy is delayed, I would get him on Eliquis or Lovenox as a bridge.   JV  ----- Message -----  From: Ramond Dial, [facility]  Sent: 01/31/2019  4:55 PM EST  To: Jettie Booze, MD, Cleon Gustin, RN  Subject: RE: Blood thinner/BX               I spoke with patients daughter. Patient did resume warfarin last night. I will have him keep him appointment on 11/5 for now. This now brings up the same question- do we bridge him? He is now living with his daughter and she is much more involved, I think his reliability for medication might be better now.  Dr. Clayton Bibles what do you  think? He has hx of TIA and possible lung cancer. INR, most likely will not be therapeutic by next week, that will leave him subtherapeutic for potentially a month.  ----- Message -----  From: Cleon Gustin, RN  Sent: 01/31/2019  4:46 PM EDT  To: Jettie Booze, MD, Ramond Dial, [place], *  Subject: FW: Blood thinner/BX               Melissa can you please review?  ----- Message -----  From: Lenore Cordia  Sent: 01/31/2019  4:21 PM EDT  To: Jettie Booze, MD, Cleon Gustin, RN  Subject: Blood thinner/BX                           Dr Irish Lack,   Due to insurance/authorization, Mr Mcclain biopsy has been rescheduled  To November 11, he is on coumadin, how do we proceed?   She he resume taking it .Marland Kitchen  Then his last dose will be November 6th    Karlo Goeden.

## 2019-02-11 ENCOUNTER — Other Ambulatory Visit: Payer: Self-pay | Admitting: Physician Assistant

## 2019-02-11 ENCOUNTER — Other Ambulatory Visit: Payer: Self-pay | Admitting: Radiology

## 2019-02-12 ENCOUNTER — Other Ambulatory Visit: Payer: Self-pay

## 2019-02-12 ENCOUNTER — Ambulatory Visit (HOSPITAL_COMMUNITY)
Admission: RE | Admit: 2019-02-12 | Discharge: 2019-02-12 | Disposition: A | Discharge status: Home / Self Care | Payer: Medicare Other | Source: Ambulatory Visit | Attending: Surgery | Admitting: Surgery

## 2019-02-12 ENCOUNTER — Ambulatory Visit (HOSPITAL_COMMUNITY)
Admission: RE | Admit: 2019-02-12 | Discharge: 2019-02-12 | Disposition: A | Discharge status: Home / Self Care | Payer: Medicare Other | Source: Ambulatory Visit | Attending: Interventional Radiology | Admitting: Interventional Radiology

## 2019-02-12 DIAGNOSIS — I712 Thoracic aortic aneurysm, without rupture: Secondary | ICD-10-CM | POA: Diagnosis not present

## 2019-02-12 DIAGNOSIS — I252 Old myocardial infarction: Secondary | ICD-10-CM | POA: Diagnosis not present

## 2019-02-12 DIAGNOSIS — H5462 Unqualified visual loss, left eye, normal vision right eye: Secondary | ICD-10-CM | POA: Insufficient documentation

## 2019-02-12 DIAGNOSIS — R7303 Prediabetes: Secondary | ICD-10-CM | POA: Insufficient documentation

## 2019-02-12 DIAGNOSIS — D696 Thrombocytopenia, unspecified: Secondary | ICD-10-CM | POA: Insufficient documentation

## 2019-02-12 DIAGNOSIS — N189 Chronic kidney disease, unspecified: Secondary | ICD-10-CM | POA: Diagnosis not present

## 2019-02-12 DIAGNOSIS — R918 Other nonspecific abnormal finding of lung field: Secondary | ICD-10-CM | POA: Insufficient documentation

## 2019-02-12 DIAGNOSIS — I1310 Hypertensive heart and chronic kidney disease without heart failure, with stage 1 through stage 4 chronic kidney disease, or unspecified chronic kidney disease: Secondary | ICD-10-CM | POA: Diagnosis not present

## 2019-02-12 DIAGNOSIS — L219 Seborrheic dermatitis, unspecified: Secondary | ICD-10-CM | POA: Diagnosis not present

## 2019-02-12 DIAGNOSIS — I739 Peripheral vascular disease, unspecified: Secondary | ICD-10-CM | POA: Insufficient documentation

## 2019-02-12 DIAGNOSIS — Z951 Presence of aortocoronary bypass graft: Secondary | ICD-10-CM | POA: Diagnosis not present

## 2019-02-12 DIAGNOSIS — I2510 Atherosclerotic heart disease of native coronary artery without angina pectoris: Secondary | ICD-10-CM | POA: Insufficient documentation

## 2019-02-12 DIAGNOSIS — M1990 Unspecified osteoarthritis, unspecified site: Secondary | ICD-10-CM | POA: Insufficient documentation

## 2019-02-12 DIAGNOSIS — Z87891 Personal history of nicotine dependence: Secondary | ICD-10-CM | POA: Diagnosis not present

## 2019-02-12 DIAGNOSIS — Z8249 Family history of ischemic heart disease and other diseases of the circulatory system: Secondary | ICD-10-CM | POA: Diagnosis not present

## 2019-02-12 DIAGNOSIS — C3411 Malignant neoplasm of upper lobe, right bronchus or lung: Secondary | ICD-10-CM | POA: Insufficient documentation

## 2019-02-12 DIAGNOSIS — Z8673 Personal history of transient ischemic attack (TIA), and cerebral infarction without residual deficits: Secondary | ICD-10-CM | POA: Diagnosis not present

## 2019-02-12 DIAGNOSIS — Z9889 Other specified postprocedural states: Secondary | ICD-10-CM

## 2019-02-12 DIAGNOSIS — Z7901 Long term (current) use of anticoagulants: Secondary | ICD-10-CM | POA: Insufficient documentation

## 2019-02-12 DIAGNOSIS — E785 Hyperlipidemia, unspecified: Secondary | ICD-10-CM | POA: Diagnosis not present

## 2019-02-12 DIAGNOSIS — R079 Chest pain, unspecified: Secondary | ICD-10-CM | POA: Insufficient documentation

## 2019-02-12 DIAGNOSIS — Z79899 Other long term (current) drug therapy: Secondary | ICD-10-CM | POA: Insufficient documentation

## 2019-02-12 DIAGNOSIS — I480 Paroxysmal atrial fibrillation: Secondary | ICD-10-CM | POA: Diagnosis not present

## 2019-02-12 LAB — CBC
HCT: 33.4 % — ABNORMAL LOW (ref 39.0–52.0)
Hemoglobin: 10.5 g/dL — ABNORMAL LOW (ref 13.0–17.0)
MCH: 27.8 pg (ref 26.0–34.0)
MCHC: 31.4 g/dL (ref 30.0–36.0)
MCV: 88.4 fL (ref 80.0–100.0)
Platelets: 296 10*3/uL (ref 150–400)
RBC: 3.78 MIL/uL — ABNORMAL LOW (ref 4.22–5.81)
RDW: 14.3 % (ref 11.5–15.5)
WBC: 6 10*3/uL (ref 4.0–10.5)
nRBC: 0 % (ref 0.0–0.2)

## 2019-02-12 LAB — PROTIME-INR
INR: 1.7 — ABNORMAL HIGH (ref 0.8–1.2)
Prothrombin Time: 19.5 seconds — ABNORMAL HIGH (ref 11.4–15.2)

## 2019-02-12 MED ORDER — FENTANYL CITRATE (PF) 100 MCG/2ML IJ SOLN
INTRAMUSCULAR | Status: AC
  Filled 2019-02-12: qty 4

## 2019-02-12 MED ORDER — SODIUM CHLORIDE 0.9 % IV SOLN
INTRAVENOUS | Status: DC
  Administered 2019-02-12: 11:00:00 via INTRAVENOUS

## 2019-02-12 MED ORDER — MIDAZOLAM HCL 2 MG/2ML IJ SOLN
INTRAMUSCULAR | Status: AC | PRN
  Administered 2019-02-12: 0.5 mg via INTRAVENOUS
  Administered 2019-02-12 (×2): 1 mg via INTRAVENOUS

## 2019-02-12 MED ORDER — FENTANYL CITRATE (PF) 100 MCG/2ML IJ SOLN
INTRAMUSCULAR | Status: AC | PRN
  Administered 2019-02-12 (×2): 50 ug via INTRAVENOUS

## 2019-02-12 MED ORDER — LIDOCAINE HCL 1 % IJ SOLN
INTRAMUSCULAR | Status: AC
  Filled 2019-02-12: qty 20

## 2019-02-12 MED ORDER — MIDAZOLAM HCL 2 MG/2ML IJ SOLN
INTRAMUSCULAR | Status: AC
  Filled 2019-02-12: qty 4

## 2019-02-12 NOTE — Discharge Instructions (Addendum)
Needle Biopsy, Care After °These instructions tell you how to care for yourself after your procedure. Your doctor may also give you more specific instructions. Call your doctor if you have any problems or questions. °What can I expect after the procedure? °After the procedure, it is common to have: °· Soreness. °· Bruising. °· Mild pain. °Follow these instructions at home: ° °· Return to your normal activities as told by your doctor. Ask your doctor what activities are safe for you. °· Take over-the-counter and prescription medicines only as told by your doctor. °· Wash your hands with soap and water before you change your bandage (dressing). If you cannot use soap and water, use hand sanitizer. °· Follow instructions from your doctor about: °? How to take care of your puncture site. °? When and how to change your bandage. °? When to remove your bandage. °· Check your puncture site every day for signs of infection. Watch for: °? Redness, swelling, or pain. °? Fluid or blood.  °? Pus or a bad smell. °? Warmth. °· Do not take baths, swim, or use a hot tub until your doctor approves. Ask your doctor if you may take showers. You may only be allowed to take sponge baths. °· Keep all follow-up visits as told by your doctor. This is important. °Contact a doctor if you have: °· A fever. °· Redness, swelling, or pain at the puncture site, and it lasts longer than a few days. °· Fluid, blood, or pus coming from the puncture site. °· Warmth coming from the puncture site. °Get help right away if: °· You have a lot of bleeding from the puncture site. °Summary °· After the procedure, it is common to have soreness, bruising, or mild pain at the puncture site. °· Check your puncture site every day for signs of infection, such as redness, swelling, or pain. °· Get help right away if you have severe bleeding from your puncture site. °This information is not intended to replace advice given to you by your health care provider. Make  sure you discuss any questions you have with your health care provider. °Document Released: 03/02/2008 Document Revised: 04/02/2017 Document Reviewed: 04/02/2017 °Elsevier Patient Education © 2020 [employer] °Moderate Conscious Sedation, Adult, Care After °These instructions provide you with information about caring for yourself after your procedure. Your health care provider may also give you more specific instructions. Your treatment has been planned according to current medical practices, but problems sometimes occur. Call your health care provider if you have any problems or questions after your procedure. °What can I expect after the procedure? °After your procedure, it is common: °· To feel sleepy for several hours. °· To feel clumsy and have poor balance for several hours. °· To have poor judgment for several hours. °· To vomit if you eat too soon. °Follow these instructions at home: °For at least 24 hours after the procedure: ° °· Do not: °? Participate in activities where you could fall or become injured. °? Drive. °? Use heavy machinery. °? Drink alcohol. °? Take sleeping pills or medicines that cause drowsiness. °? Make important decisions or sign legal documents. °? Take care of children on your own. °· Rest. °Eating and drinking °· Follow the diet recommended by your health care provider. °· If you vomit: °? Drink water, juice, or soup when you can drink without vomiting. °? Make sure you have little or no nausea before eating solid foods. °General instructions °· Have a responsible adult stay with   you until you are awake and alert. °· Take over-the-counter and prescription medicines only as told by your health care provider. °· If you smoke, do not smoke without supervision. °· Keep all follow-up visits as told by your health care provider. This is important. °Contact a health care provider if: °· You keep feeling nauseous or you keep vomiting. °· You feel light-headed. °· You develop a rash. °· You  have a fever. °Get help right away if: °· You have trouble breathing. °This information is not intended to replace advice given to you by your health care provider. Make sure you discuss any questions you have with your health care provider. °Document Released: 01/08/2013 Document Revised: 03/02/2017 Document Reviewed: 07/10/2015 °Elsevier Patient Education © 2020 [employer] Inc. ° °

## 2019-02-12 NOTE — Progress Notes (Signed)
Discharge instructions reviewed with pt and his daughter. Both voice understanding.

## 2019-02-12 NOTE — H&P (Signed)
Chief Complaint: Patient was seen in consultation today for lung biopsy  Referring Physician(s): Gaye Pollack  Supervising Physician: Sandi Mariscal  Patient Status: [facility] - Out-pt  History of Present Illness: Dylan Morgan is a 73 y.o. male with a past medical history significant for arthritis, CKD, GERD, anemia, CAD s/p CABG, HLD, HTN, AAA s/p stenting, PVD, CVA, paroxysmal a.fib on Coumadin and newly noted right upper lobe lung mass who presents today for a lung biopsy. Dylan Morgan has been followed by cardiothoracic surgery for AAA and underwent a CT chest w/o contrast on 04/18/18 as part of routine follow aneurysm follow up. This CT scan noted a stable ascending thoracic aortic aneurysm however a new 2.4 cm mass/consolidation at the apex was noted. He then underwent PET scan on 06/04/18 which noted an intense uptake associated with the right apical lung nodule compatible with primary bronchogenic carcinoma. He was previously scheduled for a percutaneous biopsy in March of this year however he did not show up for his appointment. He was seen by his PCP several times in the interim and he was strongly encouraged to consider proceeding with biopsy. After several discussions patient agreed to proceed with CT guided lung biopsy for which he presents today.  Dylan Morgan reports right sided lower/side chest pain which has been present for several months and is unchanged today. He denies any other complaints except that he is hungry. He states that his daughter [name] gives him his medicine everyday and he is sure he has not take coumadin in 5 days but is not sure when he last took his Eliquis. I spoke with [name] via phone today who confirms his last dose of coumadin was 5 days ago and his last dose of Eliquis was Monday morning.   Past Medical History:  Diagnosis Date   AAA (abdominal aortic aneurysm) ([place])    a. 2010 s/p stenting   Anemia    Arthritis    "pretty much all over" (09/06/2016)   Blind  left eye    "explosion knocked hole in retina" (09/06/2016)   Bradycardia    CAD (coronary artery disease) CABG in 2004   a. 2004 s/p CABG. b. NSTEMI 09/2016 -> cath with occ VG-distal Cx, managed medically.   Carotid artery disease ([place])    a. 2012 dopplers with old LICA occlusion , RICA no significant abnormality   Chronic lower back pain    CKD (chronic kidney disease), stage III    CVA (cerebral vascular accident) ([place])    a. 2013 R Carona radiata stroke    GERD (gastroesophageal reflux disease)    History of blood transfusion 2004   "related to OHS"   Hyperlipidemia    Hypertension    Paroxysmal atrial fibrillation ([place]) 02/22/2009   a. on Coumadin    Pre-diabetes    PVD (peripheral vascular disease) ([place])    a. ?R subclavian stenosis by CT 09/2016.   Seborrheic dermatitis of scalp    Thrombocytopenia (H[facility])    Tobacco abuse     Past Surgical History:  Procedure Laterality Date   ABDOMINAL AORTIC ANEURYSM REPAIR  2010   Aortic stent repair   CATARACT EXTRACTION W/ INTRAOCULAR LENS  IMPLANT, BILATERAL     CORONARY ARTERY BYPASS GRAFT  2004   "CABG X3"   LEFT HEART CATH AND CORS/GRAFTS ANGIOGRAPHY N/A 09/08/2016   Procedure: Left Heart Cath and Cors/Grafts Angiography;  Surgeon: Troy Sine, MD;  Location: [facility];  Service: Cardiovascular;  Laterality:  N/A;   SHOULDER OPEN ROTATOR CUFF REPAIR Left 1990    Allergies: Diltiazem hcl  Medications: Prior to Admission medications   Medication Sig Start Date End Date Taking? Authorizing Provider  acetaminophen (TYLENOL) 325 MG tablet Take 650 mg by mouth every 6 (six) hours as needed for moderate pain or headache.   Yes [provider]  atorvastatin (LIPITOR) 80 MG tablet TAKE 1 TABLET BY MOUTH ONCE DAILY AT  6PM. Patient taking differently: Take 80 mg by mouth daily at 6 PM.  12/24/18  Yes Jettie Booze, MD  Coenzyme Q10 (CO Q 10 PO) Take 1 tablet by mouth daily.    Yes  [provider]  fenofibrate (TRICOR) 145 MG tablet Take 1 tablet (145 mg total) by mouth daily. Patient taking differently: Take 145 mg by mouth every evening.  10/07/18  Yes Jettie Booze, MD  HYDROcodone-acetaminophen (NORCO/VICODIN) 5-325 MG tablet Take 1 tablet by mouth every 6 (six) hours as needed for pain. 01/31/19  Yes [provider]  isosorbide mononitrate (IMDUR) 60 MG 24 hr tablet Take 1 tablet (60 mg total) by mouth daily. Patient taking differently: Take 60 mg by mouth every evening.  09/30/18  Yes Jettie Booze, MD  loratadine (CLARITIN) 10 MG tablet Take 1 tablet (10 mg total) by mouth daily. 06/11/18 06/11/19 Yes Aldine Contes, MD  trolamine salicylate (ASPERCREME) 10 % cream Apply 1 application topically as needed for muscle pain.   Yes [provider]  vitamin B-12 (CYANOCOBALAMIN) 1000 MCG tablet Take 1 tablet (1,000 mcg total) by mouth daily. Patient taking differently: Take 1,000 mcg by mouth 2 (two) times daily.  11/21/17  Yes Aldine Contes, MD  warfarin (COUMADIN) 3 MG tablet TAKE 1 TABLET BY MOUTH  DAILY ( STOPPING ELIQUIS ) Patient taking differently: Take 1.5-3 mg by mouth See admin instructions. Take 3 mg in the evening on Mon, Wed, Fri, Sat, and Sun. Take 1.5 mg in the evening on Tue and Thurs. 12/24/18  Yes Jettie Booze, MD  albuterol (PROVENTIL HFA;VENTOLIN HFA) 108 (90 Base) MCG/ACT inhaler Inhale 2 puffs into the lungs every 6 (six) hours as needed for wheezing or shortness of breath. 06/11/18   Aldine Contes, MD  diclofenac sodium (VOLTAREN) 1 % GEL Apply 4 g topically 4 (four) times daily. Patient not taking: Reported on 02/05/2019 11/26/18   Aldine Contes, MD  metoprolol succinate (TOPROL-XL) 25 MG 24 hr tablet TAKE 1 TABLET BY MOUTH ONCE DAILY Patient taking differently: Take 25 mg by mouth daily as needed (high bp).  03/06/18   Aldine Contes, MD  Multiple Vitamin (MULTIVITAMIN) tablet Take 1 tablet by  mouth daily. 02/29/16   Riccardo Dubin, MD     Family History  Problem Relation Age of Onset   Diabetes Mother    Stroke Father    Thyroid cancer Sister    Heart disease Brother        Coronary artery disease   Heart attack Brother    Heart attack Brother     Social History   Socioeconomic History   Marital status: Divorced    Spouse name: Not on file   Number of children: 4   Years of education: 8   Highest education level: Not on file  Occupational History   Occupation: Retired  Scientist, product/process development strain: Not on file   Food insecurity    Worry: Not on file    Inability: Not on [employer]  needs    Medical: Not on file    Non-medical: Not on file  Tobacco Use   Smoking status: Former Smoker    Packs/day: 3.00    Years: 51.00    Pack years: 153.00    Types: Cigarettes    Quit date: 02/03/2010    Years since quitting: 9.0   Smokeless tobacco: Never Used   Tobacco comment: "chewed tobacco 1-2 times'  Substance and Sexual Activity   Alcohol use: Yes    Alcohol/week: 0.0 standard drinks    Comment: 09/06/2016 "nothing in a long while; used to drink q now and then"   Drug use: Yes    Types: Marijuana    Comment: 09/06/2016 "stopped in 2002"   Sexual activity: Yes  Lifestyle   Physical activity    Days per week: Not on file    Minutes per session: Not on file   Stress: Not on file  Relationships   Social connections    Talks on phone: Not on file    Gets together: Not on file    Attends religious service: Not on file    Active member of club or organization: Not on file    Attends meetings of clubs or organizations: Not on file    Relationship status: Not on file  Other Topics Concern   Not on file  Social History Narrative   Lives with son and his significant other, denies being married. Has 3 daughters & a son.  Several grandchildren.    Divorced.   Regular exercise.   Prior to retirement, hung dry wall for  a living.    Right-handed   Caffeine: occasional soft drink     Review of Systems: A 12 point ROS discussed and pertinent positives are indicated in the HPI above.  All other systems are negative.  Review of Systems  Constitutional: Negative for chills and fever.  Respiratory: Negative for cough and shortness of breath.   Cardiovascular: Positive for chest pain.  Gastrointestinal: Negative for abdominal pain, blood in stool, diarrhea, nausea and vomiting.  Genitourinary: Negative for dysuria and hematuria.  Musculoskeletal: Negative for back pain.  Skin: Negative for rash and wound.  Neurological: Negative for dizziness and headaches.    Vital Signs: BP (!) 151/94    Pulse 62    Temp (!) 97.2 F (36.2 C) (Skin)    Resp 18    Ht 5\' 11"  (1.803 m)    Wt 207 lb (93.9 kg)    SpO2 98%    BMI 28.87 kg/m   Physical Exam Vitals signs reviewed.  Constitutional:      General: He is not in acute distress. HENT:     Head: Normocephalic.     Mouth/Throat:     Mouth: Mucous membranes are moist.     Pharynx: Oropharynx is clear. No oropharyngeal exudate or posterior oropharyngeal erythema.  Cardiovascular:     Rate and Rhythm: Normal rate.  Pulmonary:     Effort: Pulmonary effort is normal.     Breath sounds: Normal breath sounds.  Abdominal:     General: Bowel sounds are normal. There is no distension.     Palpations: Abdomen is soft.     Tenderness: There is no abdominal tenderness.  Skin:    General: Skin is warm and dry.  Neurological:     Mental Status: He is alert and oriented to person, place, and time.  Psychiatric:        Mood and Affect: Mood normal.  Behavior: Behavior normal.        Thought Content: Thought content normal.        Judgment: Judgment normal.      MD Evaluation Airway: WNL Heart: WNL Abdomen: WNL Chest/ Lungs: WNL ASA  Classification: 3 Mallampati/Airway Score: Two   Imaging: Dg Chest 2 View  Result Date: 01/16/2019 CLINICAL DATA:   Hemoptysis EXAM: CHEST - 2 VIEW COMPARISON:  04/19/2018 FINDINGS: Postsurgical changes to the sternum and mediastinum. Stable cardiomegaly. Tortuous aorta. Interval increase in size of masslike opacity in the medial aspect of the right lung apex measuring approximately 5 x 3 cm (previously measured 2.4 cm on 04/18/2018). Left lung is clear. No pleural effusion or pneumothorax. IMPRESSION: Interval increase in size of right apical lung mass now measuring approximately 5 x 3 cm (previously measured 2.4 cm on 04/18/2018). Findings highly suspicious for primary pulmonary neoplasm. Electronically Signed   By: Davina Poke M.D.   On: 01/16/2019 18:52   Ct Chest Wo Contrast  Result Date: 01/16/2019 CLINICAL DATA:  73 year old male with hemoptysis. EXAM: CT CHEST WITHOUT CONTRAST TECHNIQUE: Multidetector CT imaging of the chest was performed following the standard protocol without IV contrast. COMPARISON:  Chest radiograph dated 01/16/2019 and CT dated 04/18/2018 FINDINGS: Evaluation of this exam is limited in the absence of intravenous contrast. Cardiovascular: There is no cardiomegaly or pericardial effusion. Multi vessel coronary vascular calcification. There is moderate atherosclerotic calcification of the thoracic aorta. The aorta is tortuous. Dilated ascending aorta measures up to 4.6 cm in diameter similar to prior CT. Follow-up as per recommendation of the prior CT. Evaluation of the aorta and pulmonary arteries is limited on this noncontrast CT. Mediastinum/Nodes: No hilar adenopathy. Top-normal right paratracheal lymph node measures up to 10 mm in short axis similar to prior CT. No mediastinal adenopathy. The esophagus is grossly unremarkable. No mediastinal fluid collection. Lungs/Pleura: Right upper lobe mass measures 4.7 x 3.6 cm (previously 2.0 x 1.8 cm). This mass abuts the medial right upper lobe pleural surface and extends into the right upper mediastinum posterior to the subclavian artery and  abuts the right first and second costovertebral junction. Punctate focus of air noted within the inferior aspect of this mass. Mild nodularity in the adjacent lung parenchyma noted. This is concerning for malignancy. Clinical correlation and pulmonary consult is advised. The left lung is clear. There is no pleural effusion pneumothorax. The central airways are patent. Upper Abdomen: Small gallstone. Probable mild fatty liver. The visualized upper abdomen is otherwise unremarkable. Musculoskeletal: Degenerative changes of the spine. Median sternotomy wires. No acute osseous pathology. IMPRESSION: 1. Interval increase in the size of the right upper lobe mass concerning for malignancy. Clinical correlation and pulmonary consult is advised. 2. Dilatation of the ascending aorta measuring up to 4.6 cm in diameter. Follow-up as per recommendation of the prior CT. 3. Cholelithiasis. Aortic Atherosclerosis (ICD10-I70.0). Electronically Signed   By: Anner Crete M.D.   On: 01/16/2019 20:12    Labs:  CBC: Recent Labs    09/04/18 0924 10/27/18 0219 01/16/19 1818 02/12/19 1022  WBC 3.9 6.4 8.0 6.0  HGB 12.8* 11.4* 11.2* 10.5*  HCT 39.2 35.3* 36.3* 33.4*  PLT 191 222 298 296    COAGS: Recent Labs    01/16/19 1818 01/27/19 1250 02/06/19 1327 02/12/19 1022  INR 5.3* 1.6* 2.0 1.7*    BMP: Recent Labs    11/26/18 0857 12/19/18 0931 01/07/19 1017 01/16/19 1818  NA 145* 140 142 136  K 5.4* 4.8 4.5  4.4  CL 105 108 109* 108  CO2 19* 20* 21 15*  GLUCOSE 103* 118* 96 91  BUN 35* 36* 28* 37*  CALCIUM 10.4* 9.4 9.1 9.7  CREATININE 2.09* 2.08* 1.77* 2.14*  GFRNONAA 30* 30* 37* 29*  GFRAA 35* 35* 42* 34*    LIVER FUNCTION TESTS: Recent Labs    04/08/18 0000 09/04/18 0924 09/30/18 0927 12/16/18 0849  BILITOT 0.3 0.5 0.3 0.3  AST 20 19 21 20   ALT 13 12 11 11   ALKPHOS 119* 131* 143* 63  PROT 6.8 6.9 6.8 7.2  ALBUMIN 4.0 3.9 4.1 4.0    TUMOR MARKERS: No results for input(s): AFPTM,  CEA, CA199, CHROMGRNA in the last 8760 hours.  Assessment and Plan:  73 y/o M with right upper lobe lung mass followed by cardiothoracic surgery who presents today for an image guided percutaneous lung biopsy.  Patient has been NPO since 7 pm yesterday, last does of Coumadin 11/6 and last dose of Eliquis morning of 11/9 per daughter [name]. Afebrile, WBC 6.0, hgb 10.5, plt 296, INR 1.7, COVID (-) 11/7.  Risks and benefits discussed with the patient including, but not limited to bleeding, hemoptysis, respiratory failure requiring intubation, infection, pneumothorax requiring chest tube placement, stroke from air embolism or even death.  All of the patient's questions were answered, patient is agreeable to proceed.  Consent signed and in chart.  Thank you for this interesting consult.  I greatly enjoyed meeting Dylan Morgan and look forward to participating in their care.  A copy of this report was sent to the requesting provider on this date.  Electronically Signed: Joaquim Nam, PA-C 02/12/2019, 11:09 AM   I spent a total of 15 Minutes in face to face in clinical consultation, greater than 50% of which was counseling/coordinating care for lung biopsy.

## 2019-02-12 NOTE — Procedures (Signed)
Pre procedural Dx: Hypermetabolic enlarging right apical lung mass  Post procedural Dx: Same  Technically successful CT guided biopsy of hypermetabolic enlarging right apical lung mass   EBL: None.   Complications: None immediate.   Ronny Bacon, MD Pager #: [phone]

## 2019-02-14 LAB — SURGICAL PATHOLOGY

## 2019-02-18 ENCOUNTER — Ambulatory Visit (INDEPENDENT_AMBULATORY_CARE_PROVIDER_SITE_OTHER): Payer: Medicare Other | Admitting: Internal Medicine

## 2019-02-18 ENCOUNTER — Encounter: Payer: Self-pay | Admitting: Internal Medicine

## 2019-02-18 ENCOUNTER — Other Ambulatory Visit: Payer: Self-pay

## 2019-02-18 ENCOUNTER — Ambulatory Visit (INDEPENDENT_AMBULATORY_CARE_PROVIDER_SITE_OTHER): Payer: Medicare Other | Admitting: *Deleted

## 2019-02-18 VITALS — BP 113/82 | HR 72 | Temp 98.2°F | Ht 68.0 in | Wt 203.0 lb

## 2019-02-18 DIAGNOSIS — R911 Solitary pulmonary nodule: Secondary | ICD-10-CM

## 2019-02-18 DIAGNOSIS — I480 Paroxysmal atrial fibrillation: Secondary | ICD-10-CM

## 2019-02-18 DIAGNOSIS — C3411 Malignant neoplasm of upper lobe, right bronchus or lung: Secondary | ICD-10-CM | POA: Diagnosis not present

## 2019-02-18 DIAGNOSIS — Z5181 Encounter for therapeutic drug level monitoring: Secondary | ICD-10-CM

## 2019-02-18 DIAGNOSIS — M25511 Pain in right shoulder: Secondary | ICD-10-CM

## 2019-02-18 LAB — POCT INR: INR: 2.4 (ref 2.0–3.0)

## 2019-02-18 MED ORDER — ACETAMINOPHEN 500 MG PO TABS
1000.0000 mg | ORAL_TABLET | Freq: Once | ORAL | Status: AC
  Administered 2019-02-18: 1000 mg via ORAL

## 2019-02-18 MED ORDER — WARFARIN SODIUM 3 MG PO TABS: ORAL_TABLET | ORAL | 2 refills | Status: AC

## 2019-02-18 MED ORDER — HYDROCODONE-ACETAMINOPHEN 7.5-325 MG PO TABS: 1.0000 | ORAL_TABLET | Freq: Every evening | ORAL | 0 refills | Status: AC | PRN

## 2019-02-18 NOTE — Patient Instructions (Signed)
Description   Continue normal dose 1 tablet daily except 1/2 tablet on Tuesday and Thursday.  Call Coumadin clinic with questions [phone].

## 2019-02-18 NOTE — Progress Notes (Signed)
CC: Shoulder Pain  HPI:  [name] is a 73 y.o., with a PMH noted below, who presents to the clinic endorsing shoulder and hip pain. To see the management of his acute and chronic conditions, please see the Encounters tab.   Past Medical History:  Diagnosis Date  . AAA (abdominal aortic aneurysm) ([place])    a. 2010 s/p stenting  . Anemia   . Arthritis    "pretty much all over" (09/06/2016)  . Blind left eye    "explosion knocked hole in retina" (09/06/2016)  . Bradycardia   . CAD (coronary artery disease) CABG in 2004   a. 2004 s/p CABG. b. NSTEMI 09/2016 -> cath with occ VG-distal Cx, managed medically.  . Carotid artery disease ([place])    a. 2012 dopplers with old LICA occlusion , RICA no significant abnormality  . Chronic lower back pain   . CKD (chronic kidney disease), stage III   . CVA (cerebral vascular accident) [facility])    a. 2013 R Carona radiata stroke   . GERD (gastroesophageal reflux disease)   . History of blood transfusion 2004   "related to OHS"  . Hyperlipidemia   . Hypertension   . Paroxysmal atrial fibrillation ([place]) 02/22/2009   a. on Coumadin   . Pre-diabetes   . PVD (peripheral vascular disease) ([place])    a. ?R subclavian stenosis by CT 09/2016.  Marland Kitchen Seborrheic dermatitis of scalp   . Thrombocytopenia ([place])   . Tobacco abuse    Review of Systems:   Review of Systems  Constitutional: Positive for malaise/fatigue. Negative for chills and fever.       Patient states he is fatigued due to lack of sleep due to shoulder and thigh pain. Roughly sleeps for 4 hours a night for 2 months.   Eyes: Negative for blurred vision, double vision and pain.  Respiratory: Negative for cough.   Cardiovascular: Negative for chest pain and palpitations.  Gastrointestinal: Negative for abdominal pain, blood in stool, constipation, diarrhea, nausea and vomiting.  Genitourinary: Negative for dysuria and urgency.  Musculoskeletal: Positive for back pain and neck pain. Negative  for myalgias.       Complaints of R shoulder and back pain with radiation to the elbow.   Neurological: Positive for weakness. Negative for dizziness, tingling and headaches.    Physical Exam:  Vitals:   02/18/19 1455  BP: 113/82  Pulse: 72  Temp: 98.2 F (36.8 C)  TempSrc: Oral  SpO2: 97%  Weight: 203 lb (92.1 kg)  Height: 5\' 8"  (1.727 m)   Physical Exam Vitals signs and nursing note reviewed.  Constitutional:      General: He is in acute distress.     Appearance: He is normal weight. He is not toxic-appearing or diaphoretic.     Comments: Patient appears uncomfortable during interview, moving around in chair trying to get comfortable.   HENT:     Head: Normocephalic and atraumatic.  Cardiovascular:     Rate and Rhythm: Normal rate and regular rhythm.     Pulses: Normal pulses.     Heart sounds: Normal heart sounds. No murmur. No friction rub. No gallop.   Pulmonary:     Effort: Pulmonary effort is normal.     Breath sounds: Normal breath sounds. No wheezing, rhonchi or rales.  Abdominal:     General: Abdomen is flat. Bowel sounds are normal.     Palpations: Abdomen is soft.     Tenderness: There is no abdominal  tenderness. There is no guarding.  Musculoskeletal:        General: Tenderness present.     Comments: Focal tenderness palpated along the R shoulder and over the thoracic spine spinal column. Painful arc noted in the RUE.    Skin:    General: Skin is warm.     Comments: Hematoma present on the anterior aspect of the RLE  Neurological:     Mental Status: He is alert and oriented to person, place, and time.     Motor: Weakness present.     Comments: 4/5 strength in the RUE and 5/5 Strength in the LUE.     Assessment & Plan:   See Encounters Tab for problem based charting.  Patient seen with Dr. Evette Doffing

## 2019-02-18 NOTE — Assessment & Plan Note (Addendum)
Mr. Straus presented to the clinic s/p ct biopsy of a R upper lobe lung nodule, with RUE pain. Pain radiates from the back to the shoulder and stops at the patient's right elbow. Pain is characterized as "burning and stabbing" and is rated a 9/10 in pain. The pain keeps the patient up at night, especially if he rolls over to the affected side. Aggravating factors include moving the affected limb and cold. Alleviating factors are warm environments. During the phsyical exam, it was noted that patient had pin point tenderness over the thoracic column, and tenderness over the affected shoulder.   Patient does endorse a fall 5 months ago, and contributes his pain to this event. Patient endorses that the xray of his R shoulder showed that his shoulder was "bone on bone." While a fall may be attributed to some arm pain, the pinpoint tenderness in the spine does not align with the history of a recent fall.    Patient was made aware of the results of his lung biopsy today in the clinic, squamous cell carcinoma. He did have a CT Chest on 04/18/2018 that did show spondylitic changes in the lower cervical and thoracic spine with a new mass in the medial upper right lobe. Furthermore, a repeat CT Chest on 01/16/2019 shows the mass increased to 4.7 x 3.6 cm from 2.0 x 1.8 cm with involvement of the R upper lobe pleura, as well as the right first and second costovertebral junction. Given the pleural involvement, and involvement of the R first and second costovertebral junction, in conjunction with the pinpoint tenderness along the thoracic spine, we are primarily concerned with metastasis of his squamous cell carcinoma to his spinal column. During the interview, physical examination, and  Review of symptoms that patient did not endorse sudden onset weakness, urinary retention, fecal incontinence, or other warning sign of spinal cord compression. Given the involvement of this lesion associated with the patient's pain with no  urgent/emergent concern for spinal cord involvement, a referral to the cancer center at [facility] will be ordered for further work up. For pain control the patient will receive Norco 7.5-325 mg, and 1,000 mg of Tylenol before returning to [place].    Plan: - Referral to [place] center for patient's squamous cell carcinoma.  - 2 week supply of Norco 7.5-325 mg ordered for pain related to squamous cell carcinoma.  - Patient given 1,000 mg for pain control for his journey back to [place].

## 2019-02-18 NOTE — Patient Instructions (Addendum)
Mr. Broyhill,  It was a pleasure meeting you today. Today we did discuss the results of your biopsy from February 12, 2019. While the news may be a lot to take in, we will continue to work with you to help you with your treatment. We have sent a referral out to [facility] cancer center. Expect a call from them to discuss an appointment time. We have also prescribed your pain medication to be picked up in [place]. Please reach out if you have any questions and concerns. Safe travels back to [place], and we look forward to seeing you again.  Sincerely,  Maudie Mercury

## 2019-02-19 NOTE — Progress Notes (Signed)
Internal Medicine Clinic Attending  I saw and evaluated the patient.  I personally confirmed the key portions of the history and exam documented by Dr. Gilford Rile and I reviewed pertinent patient test results.  The assessment, diagnosis, and plan were formulated together and I agree with the documentation in the resident's note.  Patient with new diagnosis of right apical lung cancer. We discussed this result with him and his daughter. His right shoulder pain seems more consistent with unrelated impingement syndrome, no new weakness or other neurologic finding to suggest spine involvment. We referred him to [facility] for further evaluation, staging, and consideration of treatment options.

## 2019-02-20 ENCOUNTER — Other Ambulatory Visit: Payer: Self-pay | Admitting: *Deleted

## 2019-02-20 ENCOUNTER — Telehealth: Payer: Self-pay | Admitting: *Deleted

## 2019-02-20 ENCOUNTER — Encounter: Payer: Self-pay | Admitting: *Deleted

## 2019-02-20 NOTE — Progress Notes (Signed)
Per Dr. Julien Nordmann, PDL 1 testing requested on pathology.  I contacted path dept with an update.

## 2019-02-20 NOTE — Telephone Encounter (Signed)
Pt's daughter states pt has not eaten or taken po fluids in 2 days. He is constantly crying and screaming in pain. Also he has only voided 1 time in 2 days. She is advised to take him to the nearest ED asap. She states she will do so and will call with an update tomorrow. She states they will go to [facility]

## 2019-02-20 NOTE — Progress Notes (Signed)
The proposed treatment discussed in cancer conference 02/20/19 is for discussion purpose only and not a binding recommendation.  The patient was not physically examined nor present for their treatment options.  Therefore, final treatment plans cannot be decided.

## 2019-02-21 NOTE — Telephone Encounter (Signed)
I agree. Thank you.

## 2019-02-28 MED ORDER — ALBUTEROL SULFATE (2.5 MG/3ML) 0.083% IN NEBU
2.50 | INHALATION_SOLUTION | RESPIRATORY_TRACT | Status: DC
Start: ? — End: 2019-02-28

## 2019-02-28 MED ORDER — MAGNESIUM OXIDE 400 MG PO TABS
400.00 | ORAL_TABLET | ORAL | Status: DC
Start: 2019-02-27 — End: 2019-02-28

## 2019-02-28 MED ORDER — LORATADINE 10 MG PO TABS
10.00 | ORAL_TABLET | ORAL | Status: DC
Start: 2019-02-27 — End: 2019-02-28

## 2019-02-28 MED ORDER — HYDROCODONE-ACETAMINOPHEN 7.5-325 MG PO TABS
1.00 | ORAL_TABLET | ORAL | Status: DC
Start: ? — End: 2019-02-28

## 2019-02-28 MED ORDER — ONDANSETRON HCL 4 MG/2ML IJ SOLN
4.00 | INTRAMUSCULAR | Status: DC
Start: ? — End: 2019-02-28

## 2019-02-28 MED ORDER — ACETAMINOPHEN 325 MG PO TABS
650.00 | ORAL_TABLET | ORAL | Status: DC
Start: ? — End: 2019-02-28

## 2019-02-28 MED ORDER — ACETAMINOPHEN 500 MG PO TABS
1000.00 | ORAL_TABLET | ORAL | Status: DC
Start: 2019-02-26 — End: 2019-02-28

## 2019-02-28 MED ORDER — ATORVASTATIN CALCIUM 80 MG PO TABS
80.00 | ORAL_TABLET | ORAL | Status: DC
Start: 2019-02-26 — End: 2019-02-28

## 2019-02-28 MED ORDER — TIZANIDINE HCL 4 MG PO TABS
2.00 | ORAL_TABLET | ORAL | Status: DC
Start: ? — End: 2019-02-28

## 2019-02-28 MED ORDER — COENZYME Q-10 100 MG PO CAPS
100.00 | ORAL_CAPSULE | ORAL | Status: DC
Start: 2019-02-27 — End: 2019-02-28

## 2019-02-28 MED ORDER — POLYETHYLENE GLYCOL 3350 17 G PO PACK
17.00 | PACK | ORAL | Status: DC
Start: ? — End: 2019-02-28

## 2019-02-28 MED ORDER — FENOFIBRATE 160 MG PO TABS
160.00 | ORAL_TABLET | ORAL | Status: DC
Start: 2019-02-27 — End: 2019-02-28

## 2019-02-28 MED ORDER — GENERIC EXTERNAL MEDICATION
Status: DC
Start: ? — End: 2019-02-28

## 2019-02-28 MED ORDER — MORPHINE SULFATE (PF) 2 MG/ML IV SOLN
2.00 | INTRAVENOUS | Status: DC
Start: ? — End: 2019-02-28

## 2019-02-28 MED ORDER — SODIUM CHLORIDE 0.9 % IV SOLN
10.00 | INTRAVENOUS | Status: DC
Start: ? — End: 2019-02-28

## 2019-02-28 MED ORDER — GENERIC EXTERNAL MEDICATION
2.50 | Status: DC
Start: ? — End: 2019-02-28

## 2019-02-28 MED ORDER — VITAMIN B-12 1000 MCG PO TABS
1000.00 | ORAL_TABLET | ORAL | Status: DC
Start: 2019-02-27 — End: 2019-02-28

## 2019-02-28 MED ORDER — ISOSORBIDE MONONITRATE ER 30 MG PO TB24
60.00 | ORAL_TABLET | ORAL | Status: DC
Start: 2019-02-27 — End: 2019-02-28

## 2019-02-28 MED ORDER — CALCITONIN (SALMON) 200 UNIT/ACT NA SOLN
1.00 | NASAL | Status: DC
Start: 2019-02-27 — End: 2019-02-28

## 2019-02-28 MED ORDER — NAPROXEN 500 MG PO TABS
250.00 | ORAL_TABLET | ORAL | Status: DC
Start: 2019-02-26 — End: 2019-02-28

## 2019-02-28 MED ORDER — NITROGLYCERIN 0.4 MG SL SUBL
0.40 | SUBLINGUAL_TABLET | SUBLINGUAL | Status: DC
Start: ? — End: 2019-02-28

## 2019-03-03 ENCOUNTER — Encounter: Payer: Self-pay | Admitting: *Deleted

## 2019-03-03 NOTE — Progress Notes (Signed)
Oncology Nurse Navigator Documentation  Oncology Nurse Navigator Flowsheets 03/03/2019  Navigator Location [facility]  Referral Date to RadOnc/MedOnc 03/03/2019  Navigator Encounter Type Other/I received referral on Dylan Morgan.  Due to his insurance, our doctors are out of network and he will be getting treatment in [facility] according to [employer].   Barriers/Navigation Needs Coordination of Care  Interventions Coordination of Care  Acuity Level 1-No Barriers  Time Spent with Patient 15

## 2019-03-09 ENCOUNTER — Encounter: Payer: Self-pay | Admitting: Internal Medicine

## 2019-03-10 NOTE — Telephone Encounter (Signed)
I spoke with [facility] about this and will hold off on ordering wheelchair till she speaks to the patient's family and gets back to me. Let me know if there are any updates and what I need to do when you know. Thank you for all your help

## 2019-03-11 ENCOUNTER — Telehealth: Payer: Self-pay

## 2019-03-11 NOTE — Telephone Encounter (Signed)
I spoke to patients daughter about a wheel chair for her dad.The daughter was Lorain Childes.The patient is in need of a Dentist because patient has to go to radiation and other doctors appointment.The father is weak and gets tired easily.The patient will have a Tele-Health visit on 03-12-19 in requards to a wheel chair evaluation.The patient will be at [facility] the other daughter her number is [phone] [place], [place] C12/8/202011:01 AM

## 2019-03-11 NOTE — Telephone Encounter (Signed)
Thank you :)

## 2019-03-12 ENCOUNTER — Other Ambulatory Visit: Payer: Self-pay

## 2019-03-12 ENCOUNTER — Ambulatory Visit (INDEPENDENT_AMBULATORY_CARE_PROVIDER_SITE_OTHER): Payer: Medicare Other | Admitting: Internal Medicine

## 2019-03-12 DIAGNOSIS — R0602 Shortness of breath: Secondary | ICD-10-CM | POA: Diagnosis not present

## 2019-03-12 DIAGNOSIS — R531 Weakness: Secondary | ICD-10-CM | POA: Diagnosis present

## 2019-03-12 DIAGNOSIS — C3411 Malignant neoplasm of upper lobe, right bronchus or lung: Secondary | ICD-10-CM

## 2019-03-12 NOTE — Progress Notes (Signed)
Internal Medicine Clinic Attending  Case discussed with Dr. Helberg at the time of the visit.  We reviewed the resident's history and exam and pertinent patient test results.  I agree with the assessment, diagnosis, and plan of care documented in the resident's note.    

## 2019-03-12 NOTE — Telephone Encounter (Signed)
[place], Germain Osgood, Orvis Brill, RN; [place], Stanford Breed, [place]; [place], [place] C, [place]        got it thank you

## 2019-03-12 NOTE — Telephone Encounter (Signed)
Thank You.

## 2019-03-12 NOTE — Assessment & Plan Note (Signed)
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 He is following with oncology at [facility]. Over the last 1 to 2 months he has noticed progressive shortness of breath and weakness. He currently is completely dependent on his daughter for all of his ADLs and AIDLs. He ambulates with a cane, walker, or walking stick; however, is unable to go more than 10 feet without needing to stop due to either his weakness or shortness of breath. He has not had any falls but he is determined to be a high fall risk. If he were to fall he is at risk for serious medical complications. He does feel that he has the upper arm strength needed to maneuver a manual wheelchair.  We discussed that home health may also be of benefit given his weakness and progressive shortness of breath. They're currently working to get Medicaid and once they have done so they will notify our office so we can put in the home health orders.  The patient's current weight is 203 pounds.  A/P: - Given patient's progressive weakness and shortness of breath he is at high risk for serious medical complications if he were to sustain a fall. I feel that he needs a manual wheelchair as a walker, cane, or walking stick does not provide the appropriate amount of support.
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
## 2019-03-12 NOTE — Telephone Encounter (Signed)
Community message sent to Jolee Ewing at [employer] that order is in for [employer] and F2F was today. Hubbard Hartshorn, BSN, RN-BC

## 2019-03-13 ENCOUNTER — Ambulatory Visit (INDEPENDENT_AMBULATORY_CARE_PROVIDER_SITE_OTHER): Payer: Medicare Other | Admitting: *Deleted

## 2019-03-13 ENCOUNTER — Other Ambulatory Visit: Payer: Self-pay

## 2019-03-13 DIAGNOSIS — I480 Paroxysmal atrial fibrillation: Secondary | ICD-10-CM | POA: Diagnosis not present

## 2019-03-13 DIAGNOSIS — Z5181 Encounter for therapeutic drug level monitoring: Secondary | ICD-10-CM | POA: Diagnosis not present

## 2019-03-13 DIAGNOSIS — Z7901 Long term (current) use of anticoagulants: Secondary | ICD-10-CM

## 2019-03-13 LAB — PROTIME-INR
INR: >10 (ref 0.9–1.2)
INR: 6.4 (ref 0.8–1.2)
Prothrombin Time: 120 s — ABNORMAL HIGH (ref 9.1–12.0)
Prothrombin Time: 56.8 seconds — ABNORMAL HIGH (ref 11.4–15.2)

## 2019-03-13 LAB — POCT INR: INR: 8 — AB (ref 2.0–3.0)

## 2019-03-13 NOTE — Patient Instructions (Addendum)
Description    Hold  warfarin until  Inr is checked on 12/14 and further dosing instructions are given.   normal dose 1 tablet daily except 1/2 tablet on Tuesday and Thursday.  Call Coumadin clinic with questions [phone].

## 2019-03-13 NOTE — Progress Notes (Signed)
Pt sent to lab for stat INR check. Pt and daughter advised to hold warfarin until further dosing instructions given. Informed them to expect a phone call later today. Informed them to seek medical attention if pt has any bleeding or fall.   16:45-Spoke to Thayer Jew from [facility] lab who stated pt's INR was 6.4  1715: Called and spoke to pt's daughter Abigail Butts instructed her that pt should not take any coumadin until her gets his INR checked on Monday 12/14. Pt's daughter stated that they live out of town and has Dr. Thomasene Lot on 12/14 and 12/15. Will see if Dr. Orvan Seen will be able to check pt's INR on 12/14 when pt has an appointment.  12/11: Called and spoke to [facility] from Dr. Orvan Seen office, faxed order for pt to get INR checked at the Lab that is beside Dr. Orvan Seen office, located in the hospital in [place]. Called and updated pt's daughter and made her aware that pt will need to get his INR checked at the lab on 12/14 and then the coumadin clinic will call her with updated dosing instructions. Pt's daughter was very kind and verbalized understanding.

## 2019-03-13 NOTE — Progress Notes (Signed)
INR from [facility] downstairs also > 10. Will send to hospital for recheck verification.

## 2019-03-17 ENCOUNTER — Ambulatory Visit (INDEPENDENT_AMBULATORY_CARE_PROVIDER_SITE_OTHER): Payer: Medicare Other | Admitting: Cardiology

## 2019-03-17 DIAGNOSIS — Z5181 Encounter for therapeutic drug level monitoring: Secondary | ICD-10-CM

## 2019-03-17 LAB — PROTIME-INR: INR: 4 — AB (ref 0.9–1.1)

## 2019-03-17 NOTE — Patient Instructions (Signed)
Description   Spoke to pt's daughter instructed for pt to hold coumadin today and then continue to take warfarin 3mg  daily except for 1.5 mg on Tuesday and Thursday. Pt started drinking boost 3/day on 03/16/2019. Recheck INR on 03/24/2019. Call Coumadin clinic with questions [phone].

## 2019-03-25 ENCOUNTER — Telehealth: Payer: Self-pay | Admitting: *Deleted

## 2019-03-25 NOTE — Telephone Encounter (Signed)
Pt was due an INR on 03/24/2019 and called the daughter Abigail Butts and she stated that the pt was placed in Hospice on last night and all medications have been stopped and they are making him comfortable. She states she is there with him now and he is resting, advised that we will keep him and the family in our thoughts and prayers and to call with any updates or if she needed anything and she stated she would. Discontinued Anticoagulation Track at this time.

## 2019-04-04 DEATH — deceased

## 2019-05-26 NOTE — Telephone Encounter (Signed)
Pt passed away with hospice.

## 2019-12-04 NOTE — Telephone Encounter (Signed)
Review meds
